# Patient Record
Sex: Male | Born: 1950 | Race: White | Hispanic: No | Marital: Married | Smoking: Never smoker
Health system: Southern US, Community
[De-identification: ages and names within clinical notes are randomized; demographics above are authoritative.]

## PROBLEM LIST (undated history)

## (undated) DIAGNOSIS — F419 Anxiety disorder, unspecified: Secondary | ICD-10-CM

## (undated) DIAGNOSIS — E119 Type 2 diabetes mellitus without complications: Secondary | ICD-10-CM

## (undated) DIAGNOSIS — R310 Gross hematuria: Secondary | ICD-10-CM

## (undated) DIAGNOSIS — R609 Edema, unspecified: Secondary | ICD-10-CM

## (undated) DIAGNOSIS — N281 Cyst of kidney, acquired: Secondary | ICD-10-CM

## (undated) DIAGNOSIS — N289 Disorder of kidney and ureter, unspecified: Secondary | ICD-10-CM

## (undated) DIAGNOSIS — R339 Retention of urine, unspecified: Secondary | ICD-10-CM

## (undated) DIAGNOSIS — Z87438 Personal history of other diseases of male genital organs: Secondary | ICD-10-CM

## (undated) DIAGNOSIS — I10 Essential (primary) hypertension: Secondary | ICD-10-CM

## (undated) DIAGNOSIS — M109 Gout, unspecified: Secondary | ICD-10-CM

## (undated) DIAGNOSIS — Z960 Presence of urogenital implants: Secondary | ICD-10-CM

## (undated) DIAGNOSIS — M069 Rheumatoid arthritis, unspecified: Secondary | ICD-10-CM

## (undated) DIAGNOSIS — N1330 Unspecified hydronephrosis: Secondary | ICD-10-CM

## (undated) DIAGNOSIS — E785 Hyperlipidemia, unspecified: Secondary | ICD-10-CM

## (undated) DIAGNOSIS — R011 Cardiac murmur, unspecified: Secondary | ICD-10-CM

## (undated) DIAGNOSIS — Z973 Presence of spectacles and contact lenses: Secondary | ICD-10-CM

## (undated) HISTORY — PX: SHOULDER ARTHROSCOPY WITH OPEN ROTATOR CUFF REPAIR: SHX6092

## (undated) HISTORY — DX: Essential (primary) hypertension: I10

## (undated) HISTORY — DX: Personal history of other diseases of male genital organs: Z87.438

## (undated) HISTORY — DX: Rheumatoid arthritis, unspecified: M06.9

## (undated) HISTORY — DX: Edema, unspecified: R60.9

## (undated) HISTORY — PX: NASAL FRACTURE SURGERY: SHX718

## (undated) HISTORY — PX: TRANSURETHRAL RESECTION OF PROSTATE: SHX73

## (undated) HISTORY — PX: KNEE ARTHROSCOPY: SUR90

## (undated) HISTORY — PX: APPENDECTOMY: SHX54

---

## 2000-10-10 ENCOUNTER — Ambulatory Visit (HOSPITAL_COMMUNITY): Admission: RE | Admit: 2000-10-10 | Discharge: 2000-10-10 | Payer: Self-pay | Admitting: Surgery

## 2000-10-10 HISTORY — PX: INGUINAL HERNIA REPAIR: SUR1180

## 2005-02-15 ENCOUNTER — Ambulatory Visit: Payer: Self-pay | Admitting: *Deleted

## 2005-02-18 ENCOUNTER — Ambulatory Visit: Payer: Self-pay | Admitting: Cardiology

## 2005-03-31 ENCOUNTER — Ambulatory Visit: Payer: Self-pay | Admitting: Internal Medicine

## 2005-05-10 ENCOUNTER — Ambulatory Visit: Payer: Self-pay | Admitting: Cardiology

## 2005-05-12 ENCOUNTER — Ambulatory Visit: Payer: Self-pay | Admitting: Cardiology

## 2005-07-29 ENCOUNTER — Ambulatory Visit: Payer: Self-pay | Admitting: Cardiology

## 2006-01-03 ENCOUNTER — Ambulatory Visit: Payer: Self-pay | Admitting: Cardiology

## 2006-01-05 ENCOUNTER — Ambulatory Visit: Payer: Self-pay | Admitting: Internal Medicine

## 2006-04-28 ENCOUNTER — Ambulatory Visit: Payer: Self-pay | Admitting: Cardiology

## 2006-05-04 ENCOUNTER — Ambulatory Visit: Payer: Self-pay | Admitting: Cardiology

## 2006-07-10 ENCOUNTER — Ambulatory Visit: Payer: Self-pay | Admitting: Cardiology

## 2006-07-13 ENCOUNTER — Ambulatory Visit: Payer: Self-pay | Admitting: Internal Medicine

## 2006-10-10 ENCOUNTER — Ambulatory Visit: Payer: Self-pay

## 2006-10-12 ENCOUNTER — Ambulatory Visit: Payer: Self-pay | Admitting: Internal Medicine

## 2007-01-15 ENCOUNTER — Ambulatory Visit: Payer: Self-pay

## 2007-01-15 LAB — CONVERTED CEMR LAB
ALT: 74 units/L — ABNORMAL HIGH (ref 0–40)
AST: 48 units/L — ABNORMAL HIGH (ref 0–37)
BUN: 16 mg/dL (ref 6–23)
Calcium: 9.9 mg/dL (ref 8.4–10.5)
Chloride: 101 meq/L (ref 96–112)
Cholesterol: 162 mg/dL (ref 0–200)
Creatinine, Ser: 0.9 mg/dL (ref 0.4–1.5)
Direct LDL: 61.4 mg/dL
GFR calc Af Amer: 113 mL/min
GFR calc non Af Amer: 93 mL/min
HDL: 29.3 mg/dL — ABNORMAL LOW (ref >39.0)
Hgb A1c MFr Bld: 8.3 % — ABNORMAL HIGH (ref 4.6–6.0)
Potassium: 4.1 meq/L (ref 3.5–5.1)
TSH: 1.78 microintl units/mL (ref 0.35–5.50)
VLDL: 77 mg/dL — ABNORMAL HIGH (ref 0–40)

## 2007-01-18 ENCOUNTER — Ambulatory Visit: Payer: Self-pay | Admitting: Cardiology

## 2007-02-26 ENCOUNTER — Ambulatory Visit: Payer: Self-pay | Admitting: Cardiology

## 2007-02-26 LAB — CONVERTED CEMR LAB
ALT: 68 units/L — ABNORMAL HIGH (ref 0–40)
Albumin: 3.7 g/dL (ref 3.5–5.2)
Alkaline Phosphatase: 49 units/L (ref 39–117)
Total Bilirubin: 0.8 mg/dL (ref 0.3–1.2)
Total Protein: 6.8 g/dL (ref 6.0–8.3)

## 2007-03-01 ENCOUNTER — Ambulatory Visit: Payer: Self-pay | Admitting: Cardiology

## 2007-05-31 ENCOUNTER — Ambulatory Visit: Payer: Self-pay | Admitting: Cardiology

## 2007-05-31 LAB — CONVERTED CEMR LAB
ALT: 41 units/L — ABNORMAL HIGH (ref 0–40)
AST: 30 units/L (ref 0–37)
Albumin: 3.6 g/dL (ref 3.5–5.2)
Bilirubin, Direct: 0.1 mg/dL (ref 0.0–0.3)
Cholesterol: 158 mg/dL (ref 0–200)
Direct LDL: 74.9 mg/dL
Total CHOL/HDL Ratio: 7.1
VLDL: 49 mg/dL — ABNORMAL HIGH (ref 0–40)

## 2007-06-07 ENCOUNTER — Ambulatory Visit: Payer: Self-pay | Admitting: Cardiovascular Disease

## 2007-09-17 ENCOUNTER — Ambulatory Visit: Payer: Self-pay | Admitting: Cardiology

## 2007-09-17 LAB — CONVERTED CEMR LAB
Alkaline Phosphatase: 28 units/L — ABNORMAL LOW (ref 39–117)
Direct LDL: 56.2 mg/dL
HDL: 23.5 mg/dL — ABNORMAL LOW (ref >39.0)
Total Bilirubin: 0.8 mg/dL (ref 0.3–1.2)
Total CHOL/HDL Ratio: 5.7
Total Protein: 6.6 g/dL (ref 6.0–8.3)
Triglycerides: 332 mg/dL (ref 0–149)

## 2007-09-20 ENCOUNTER — Ambulatory Visit: Payer: Self-pay | Admitting: Cardiology

## 2008-03-06 ENCOUNTER — Ambulatory Visit: Payer: Self-pay | Admitting: Cardiology

## 2008-03-06 LAB — CONVERTED CEMR LAB
ALT: 39 units/L (ref 0–53)
AST: 28 units/L (ref 0–37)
Bilirubin, Direct: 0.1 mg/dL (ref 0.0–0.3)
Cholesterol: 132 mg/dL (ref 0–200)
Direct LDL: 64.5 mg/dL
HDL: 25 mg/dL — ABNORMAL LOW (ref >39.0)
Total Bilirubin: 0.9 mg/dL (ref 0.3–1.2)
Total Protein: 6.3 g/dL (ref 6.0–8.3)

## 2008-03-13 ENCOUNTER — Ambulatory Visit: Payer: Self-pay | Admitting: Cardiology

## 2008-09-04 ENCOUNTER — Ambulatory Visit: Payer: Self-pay | Admitting: Cardiology

## 2008-09-04 LAB — CONVERTED CEMR LAB
ALT: 43 units/L (ref 0–53)
AST: 32 units/L (ref 0–37)
Alkaline Phosphatase: 39 units/L (ref 39–117)
Bilirubin, Direct: 0.1 mg/dL (ref 0.0–0.3)
Total Bilirubin: 0.8 mg/dL (ref 0.3–1.2)
Total Protein: 6.7 g/dL (ref 6.0–8.3)

## 2008-09-11 ENCOUNTER — Ambulatory Visit: Payer: Self-pay | Admitting: Cardiology

## 2011-05-10 NOTE — Assessment & Plan Note (Signed)
[facility]                               LIPID CLINIC NOTE   Jerry York, Jerry York                     MRN:          [id number]  DATE:03/13/2008                            DOB:          August 01, 1951    Jerry York returns to lipid clinic for a 89-month follow-up  appointments.  He states that he is feeling well and that he has been  doing well with his current lipid therapy which includes Lipitor,  fenofibrate, omega III fatty acids and Niaspan.  He states that he is  compliant with his medications.  He is tolerating them well and has not  had any issues with any of them. Review of diet on 24-hour recall, he  states for breakfast yesterday he had grilled chicken on a bun with diet  Coke. For lunch he had a grilled chicken also with a diet Coke. For  dinner she had lima beans, asparagus, and chicken. Upon further  questioning, he states that his diet does have a lot of chicken in it.  He states that he also eats [place] and sometimes will eat salmon or  another type of fish once a week. He states that he does not fry his  foods, he bakes or grills his foods. He also has diabetes and is trying  to watch his portion sizes especially with carbohydrates and is trying  to follow a heart healthy diet.   For exercise Jerry York is walking 30 minutes a least 3 days a week.  He states sometimes he does not make it to 3 days but most weeks he does  make it to 3 days a week.  He is not doing any other sort of exercise.   In talking today, he did mention to me that he does have a family  history of cholesterol and he states that his father had a heart attack  when he was in his 36s.   MEDICATIONS:  Medications were reviewed today and include:  1. Lipitor 40 mg daily.  2. Fenofibrate 200 mg daily.  3. Omega 3 fish oil 1200 mg three times daily.  4. Niaspan 1 gram twice daily.  5. Co-Q 10 150 mg daily.  6. Lisinopril 40 mg twice daily.  7. Potassium chloride 20  mEq daily.  8. Enteric-coated aspirin 325 mg daily.  9. Multivitamin daily.  10.Folic acid.  11.B6/B12 daily.  12.Metformin. He is taking 500 mg in the morning, 500 mg at lunch and      1000 mg at dinner.  13.Metoprolol 100 mg twice daily.  14.Hydrochlorothiazide 25 mg daily.   LABORATORY DATA:  Lab results from March 06, 2008 show a total  cholesterol of 132, triglycerides of 284, HDL of 25.0, LDL of 64.5. LFTs  were all within normal limits.  The patient states that next week he is  scheduled for a visit with his primary care physician at which point  they will check his A1c.   PHYSICAL EXAM:  Weight is 186.21 pounds, this is up slightly from  September at 184 pounds.  Blood pressure was 140/72.  Jerry York is  somewhat concerned about his blood pressure as he states that this is  higher than it normally is. His blood pressure is not at goal of less  than 130/90 because he is a diabetic.  He states that when he was on  Bisoprolol before, he thinks that he had better blood pressure control  than what he has with metoprolol. He was unsure of why the Bisoprolol  was switched to metoprolol. I advised him to talk with his primary care  Sheriann Newmann next week when he goes in for an appointment to see if they can  get that switched.   ASSESSMENT:  Total cholesterol is at goal of less than 200,  triglycerides are above goal of 150, HDL is below goal of greater than  40, LDL is at goal of less than 70 and LFTs were all within normal  limits.  Of note, despite elevated triglycerides, his triglycerides have  improved since September when they were 332 and HDL has improved  slightly from 23-1/2 in September 2008 to 25.0 now. He is at maximum  dose of fenofibrate. He is at maximum dose of Niaspan and also a maximum  dose of omega 3 fatty acids.  I explained to him the role that diet and  diabetes play with triglycerides and advised him that when he gets his  A1c checked next week with his  primary care Lezlie Ritchey that he check to  see what the A1c is and they may want to add another antidiabetic agent  to help control blood sugars which may in turn help to improve  triglycerides. However, he states that his fasting blood sugars are  usually running under 120 and his evening sugars are running around 86,  so his A1c may be at goal.  We will have to see what the lab results  say. He is doing well with his diet.  He is definitely making good  choices with the types of meats that he eats and the way that he  prepares them. I do believe he is following a heart healthy diet. We  discussed the method  diabetes in which case 1/2 of your plate should  consist of vegetables, 1/4 of plate consisting of carbohydrates and then  1/4 of your plate consisting of meat or protein. We did discuss  healthier carbohydrate options such as whole wheat bread, whole grain  pasta, brown rice. We did discuss also the role of starchy and non-  starchy vegetables and I encouraged him to increase the amount of  vegetables that he has in his diet. His exercise is I believe adequate.  I think with only exercising 3 days a week, I think he could increase  this to more frequently during the week, perhaps 4-5 days. We did  discuss if he was not able to go up to 4-5 days that perhaps instead of  walking for 30 minutes he could walk for 45 or 50 minutes.   PLAN:   MEDICATIONS:  Continue Lipitor 40 mg, fenofibrate 200 mg daily, Niaspan  1 gram twice daily and the omega 3  fish oil 1200 mg three times daily.  I did discuss with him to talk with his primary care Shivaan Tierno next week  with further blood pressure medication titration.   DIET:  Continue a heart healthy diet.  I also discussed with him  different ways to spice his foods, the grill mate spices or different  seasonings packets that he could get to  help with the chicken. He did  state in talking with him sometimes it is somewhat bland to only eat   chicken, so we discussed ways that he could make those more interesting  to eat.   EXERCISE:  I encouraged Jerry York to increase his exercise to 4-5 days  a week and to at least walk for 30 minutes and perhaps more and to  incorporate other forms of exercise as the weather gets warmer and the  weather is nice.   FOLLOWUP:  We will see Jerry York back in 6 months in lipid clinic at  which point we will check a lipid and a liver panel to assess therapy  and to reevaluate our goals.    Gerre Pebbles, PharmD dictating for [name], PharmD      [name], PharmD, BCPS, CPP  Electronically Signed      Jonelle Sidle, MD  Electronically Signed   MP/MedQ  DD: 03/13/2008  DT: 03/13/2008  Job #: [id number]

## 2011-05-10 NOTE — Assessment & Plan Note (Signed)
[facility]                               LIPID CLINIC NOTE   NAME:FORRESTCleburn, Maiolo                     MRN:          [id number]  DATE:06/07/2007                            DOB:          01/20/1951    The patient seen in the lipid clinic for further evaluation and  medication titration associated with his hyperlipidemia in the setting  of documented coronary artery disease. He has been compliant with his  triple therapy. He has had no dietary changes. He does state that maybe  he is eating fewer carbohydrates and lower sugar content items. He is  walking at least 30 minutes 3 day each week.   PAST MEDICAL HISTORY:  Pertinent for diabetes and hyperlipidemia.   CURRENT MEDICATIONS:  1. Lipitor 40 mg daily.  2. Fenofibrate 200 mg daily.  3. Triomega fish oil 1200 mg 3 times daily.  4. Niaspan 1 gram twice daily.  5. Co-Q 10, 100 mg twice daily.  6. Bisoprolol HCT 10/6.25 mg one daily.  7. Lisinopril 40 mg twice daily.  8. Potassium chloride 20 mEq daily.  9. Enteric coated aspirin 325 mg daily.  10.Multivitamin daily.  11.Vitamin B supplement daily.  12.Metformin 1000 mg twice daily.   DRUG ALLERGIES:  SULFA.   REVIEW OF SYSTEMS:  As stated in the HPI and otherwise negative.   PHYSICAL EXAMINATION:  VITAL SIGNS:  Weight today is 183 pounds, blood  pressure is 110/60, heart rate is 68, respirations 17.   LABORATORY DATA:  Labs on May 31, 2007 revealed an LDL of 74.9 and LFTs  within normal limits.   ASSESSMENT:  The patient is a pleasant male with diabetes who is feeing  and doing better. He has seen improvement in his overall blood sugars  and expect ultimately his triglycerides to track downward. The patient's  LDL is at goal, his LTFs are at goal and his triglycerides are probably  trending downward.   PLAN:  The patient will continue his normal medications without dietary  changes at this time. He will continue on a heart healthy diet  and will  followup in 3 or 4 months. He will try to exercise 45-60 minutes each  day and call with questions or problems in the meantime.      [name], PharmD, BCPS, CPP  Electronically Signed      Rollene Rotunda, MD, [facility]  Electronically Signed   MP/MedQ  DD: 06/07/2007  DT: 06/08/2007  Job #: [id number]

## 2011-05-10 NOTE — Assessment & Plan Note (Signed)
[facility]                               LIPID CLINIC NOTE   Jerry York, Jerry York                     MRN:          [id number]  DATE:09/20/2007                            DOB:          1951-01-19    Jerry York comes in today for followup of his hyperlipidemia  management.  He has been compliant and has been tolerating his current  therapy, which includes:  1. Lipitor 40 mg daily.  2. Fenofibrate 200 mg daily.  3. Fish oil 1200 mg 3 times a day.  4. Niaspan 1 g twice a day.  5. Coenzyme Q-10 100 mg twice a day.  6. His other medications have not changed and they include lisinopril.  7. Potassium.  8. Aspirin.  9. Multivitamin.  10.Folic acid with B vitamins.  11.Metformin.  12.Metoprolol.  13.Hydrochlorothiazide.   PHYSICAL EXAM:  Includes a weight of 184 pounds, blood pressure 128/74,  heart rate is 54.  Laboratory data includes a total cholesterol of 133,  triglycerides 332, HDL 23.5, LDL 56.2.  Liver function tests are within  normal limits.   ASSESSMENT:  Jerry York LDL is at goal of less than 70.  HDL remains  below goal, and triglycerides are elevated.  He has been continuing to  walk 30 minutes at a time 3 days a week around his neighborhood.  He has  continued to try to follow a heart-healthy diet.  Upon review of his  medicines, we discovered he was taking bisoprolol with  hydrochlorothiazide along with metoprolol and hydrochlorothiazide.  We  discussed duplication of therapy.   PLAN:  Jerry York is going to continue with his current therapy for  cholesterol management.  We reviewed his medicines with [name],  PharmD, BCPS, CPP and decided to stop the bisoprolol and  hydrochlorothiazide and increase his metoprolol dose, and continue  hydrochlorothiazide.  I asked him to continue with a heart-healthy diet.  I asked him to increase the amount of exercise he gets and the intensity  of that exercise.  I encouraged him to  join a gym especially with colder  weather coming so that he could walk briskly for at least 30 minutes at  a time at least 3 days a week.  I encouraged him to start a weight  training program as well and told him that this could probably help get  his HDL to go up.  We are going to follow up with him in 6 months, at  which time we will check a lipid and liver panel.  I asked him to  continue monitoring his blood glucose 1 to 2 times a day, and since we  are making an adjustment to his hypertension medicines, to continue  taking his blood pressure at home on a regular basis, and  recording that.  He was encouraged to call us with any questions or  concerns between now and his next appointment.      Charolotte Eke, PharmD  Electronically Signed      Rollene Rotunda, MD, [facility]  Electronically Signed   TP/MedQ  DD: 09/20/2007  DT: 09/20/2007  Job #: [id number]

## 2011-05-10 NOTE — Assessment & Plan Note (Signed)
Jerry York                               LIPID CLINIC NOTE   Jerry York, Jerry York                     MRN:          [id number]  DATE:09/11/2008                            DOB:          1951/04/10    Return office visit for Lipid Clinic.   PAST MEDICAL HISTORY:  1. Hyperlipidemia.  2. Diabetes mellitus.  3. Hypertension.   MEDICATIONS:  1. Fenofibrate 200 mg daily.  2. Fish oil 3 g daily.  3. Niaspan 1 g twice daily.  4. Coenzyme Q 10 daily.  5. Lisinopril 40 mg twice daily.  6. KCl 20 mEq daily.  7. Aspirin 325 mg daily.  8. Multivitamin daily.  9. Folic acid and vitamin B daily.  10.Metformin 1000 mg twice daily.  11.Metoprolol 100 mg twice daily.  12.Hydrochlorothiazide 25 mg daily.  13.Simvastatin 80 mg daily.  14.Amlodipine 10 mg daily.  15.Glipizide 2.5 mg p.r.n. CBG greater than 200.   VITAL SIGNS:  Weight 186 pounds, blood pressure 105/70, and heart rate  78.   LABORATORY DATA:  Total cholesterol 103, triglyceride 234, HDL 23, and  LDL 51.  LFTs within normal limits.   ASSESSMENT:  Jerry York is a pleasant gentleman who returns to Lipid  Clinic today with no chest pain, no shortness of breath, no muscle aches  or pains.  He states compliance with current medication regimen.  He  says he exercises 30 minutes everyday and that 30-minute is by walking.  He says that he walks approximately half a mile in those 30 minutes,  that is a very low-impact cardio exercise and he is walking at a very  slow pace.  I have encouraged him to increase that half a mile to  approximately 20 minutes, to do some interval training, to walk as fast  as he can for a minute and slow down for a minute, to hopefully shave  off approximately 5 minutes from his walking time prior to next visit.  I feel like a little more high-impact cardio exercise would be very  beneficial for Jerry York given that his total cholesterol is at goal  of less than 200,  triglycerides are significantly greater than goal of  less than 150, HDL is less than goal of greater than 40, and LDL is at  goal of less than 70.  He does stay compliant with a low-fat and low-  carbohydrate diet.  He has one or two diet sodas a day, drinking water  the rest of the day in between.  He has one sweet tea with his evening  meal with Splenda.  He says that he eats no fried foods, low-fat, high-  protein, lean chicken, or pork and rarely any red meat.  He says that he  eats a lot of vegetables and small amounts of carbohydrates with his  meals and he does not snack.  He states that his last A1c was 6.3 and  that his morning CBGs are usually about a 100.  He rarely checks the  postprandial or afternoon CBGs.  We have encouraged him to check  a CBG  after his meal or later in the afternoon versus only checking his  morning CBGs to see if there is a trend of elevation in the afternoon.  We have also asked him to increase his fish oil from 1 tablet three  times a day to 2 tablets three times a day to see if we can help bring  down these triglycerides.   PLAN:  1. Continue current medications.  2. Increase fish oil 3 g to 6 g daily.  3. Increase impact of exercise, I mean, speed of walking.  4. Continue a low-fat and low-carbohydrate diet.  5. Follow up in 4 months for lipid panel and LFTs and we will make      changes at this time.      Jerry York, PharmD  Electronically Signed      Jerry Sans. Daleen Squibb, MD, [facility]  Electronically Signed   LC/MedQ  DD: 09/11/2008  DT: 09/11/2008  Job #: [id number]

## 2011-05-13 NOTE — Assessment & Plan Note (Signed)
[facility]                              CARDIOLOGY OFFICE NOTE   Jerry York, Jerry York                       MRN:          [id number]  DATE:10/12/2006                            DOB:          03/24/1951    Return office visit for lipid clinic.   PAST MEDICAL HISTORY:  Hyperlipidemia and hypertension.   MEDICATIONS:  1. Multivitamin daily.  2. Folic acid.  3. Vitamin B12 daily.  4. Lisinopril 40 mg twice daily.  5. Bisoprolol and hydrochlorothiazide 10/6.25 daily.  6. Metoprolol 50 mg twice daily.  7. Hydrochlorothiazide 25 mg daily.  8. Niacin 500 mg daily.  9. Aspirin 325 mg daily.  10.Fish oil 500 mg 3 times daily.  11.Potassium 20 mEq daily.  12.Lipitor 40 mg daily.  13.Fenofibrate 200 mg daily.  14.Coenzyme Q10 50 mg daily.   VITAL SIGNS:  Weight 193 pounds, blood pressure 130/80, heart rate 64.   LAB DATA:  Total cholesterol 166, triglycerides 514, LDL 123, HDL 21.6.  AST  44, ALT 72.   ASSESSMENT:  Jerry York returns to the lipid clinic today with no chest  pain, no shortness of breath, no muscle aches or pains.  He is compliant  with current medication regimen.  His total cholesterol is at goal at less  than 200, triglycerides above goal of less than 150, although improved from  last visit at 807.  HDL less than goal of greater than 40, although better  from last visit of 18.5.  LDL greater than goal of less than 100 and a  significant increase from last visit, which was 71.  The patient has been  exercising 3 times a week by walking 30 minutes at a time.  He follows a  fairly heart-healthy diet with grilled chicken or salmon for his evening  meals.  He does say that, in the summer time, they deep fry squash,  zucchini, and okra, but he says that he only eats a few pieces of these.  For morning breakfast he either eats Cheerios or 3 times a week, Egg [employer], but he says he leaves off the bacon and the ham, and eats the  sausage once a week.  He had previously been initiated on TriCor and fish  oil when he started in the clinic in 2001 where his triglycerides at that  time were 6-900.  However, in the recent years, his triglycerides have been  well-controlled in the 170s and below.  Just recently, over the last 6  months, he has had a tremendous increase in his triglycerides going from the  800s now down to 500s.  He says he has had no change in diet, no changes in  medication, and is unaware of why we are having such a huge change in his  triglycerides.  On next visit I will check a CBG and also check a hemoglobin  A1C.  I will also check a TSH to ensure that there are no other problems  causing hypertriglyceridemia.  He does state compliance, as stated before,  with exercise regimen and medication compliance.  PLAN:  1. Continue current medication regimen.  2. Increase Niaspan to 1000 mg and, if the patient tolerates, to 1500 mg      by next visit.  3. Continue heart healthy diet.  4. Increase exercise to 4 times a week.  5. Followup visit in 3 months for a lipid panel and LFTs and make      adjustments needed at that time.     ______________________________  Leota Sauers, PharmD    ______________________________  Jesse Sans. Daleen Squibb, MD, [facility]   LC/MedQ  DD: 10/12/2006  DT: 10/15/2006  Job #: [id number]

## 2011-05-13 NOTE — Op Note (Signed)
[facility]  Patient:    Jerry York, Jerry York                     MRN: [id number] Proc. Date: 10/10/00 Adm. Date:  [id number] Attending:  Abigail Miyamoto A                           Operative Report  PREOPERATIVE DIAGNOSIS:  Right inguinal hernia.  POSTOPERATIVE DIAGNOSIS:  Right inguinal hernia.  PROCEDURE:  Right inguinal hernia repair with mesh.  SURGEON:  Abigail Miyamoto, M.D.  ANESTHESIA:  General endotracheal anesthesia.  ESTIMATED BLOOD LOSS:  Minimal.  DESCRIPTION OF PROCEDURE:  The patient was brought to the operating room and identified as Jerry York.  He was placed supine on the operating table and anesthesia was induced.  His right groin was then prepped and draped in the usual sterile fashion.  Using a #10 blade, a small longitudinal incision was made in the patients right groin.  The incision was carried down through Scarpas fascia with electrocautery.  The external oblique fascia was then identified with a scalpel and further toward the external ring with the scalpel.  The testicular and cord structures were identified and controlled circumferentially with a Penrose drain.  The patient was found to have a large indirect inguinal hernia with a large amount of omentum in the chronic hernia sac.  The sac was separated from its surrounding cord structures and opened, and the omental contents were passed back into the peritoneal cavity.  Next, the preperitoneal space was dissected out.  A piece of Ethicon Prolene hernia mesh system was brought onto the field.  A large piece of mesh was used.  The mesh was then passed into the preperitoneal space and the lower aspect was opened up.  The upper aspect of the mesh was then opened into the inguinal floor.  A small slit was made in the mesh.  The mesh was then sewn in place circumferentially with interrupted 2-0 Vicryl sutures, sewing it first to the pubic tubercle, laterally to the shelving  edge of the inguinal ligament, medially to the transversalis fascia, then out laterally, and then sewing around the cord.  Good coverage of the inguinal floor appeared to be achieved. At this point, the wound was irrigated.  The external oblique fascia was then closed with running 2-0 Vicryl sutures.  Scarpas fascia was then closed with interrupted 3-0 Vicryl sutures, and the skin was closed with a running 4-0 Vicryl suture.  Steri-Strips, gauze, and tape were then applied.  Prior to closing the wound, it was anesthetized with 0.25% Marcaine.  Steri-Strips, gauze, and Tegaderm were then applied.  The patient tolerated the procedure well.  All sponge, needle, and instrument counts were correct at the end of the procedure.  The patient was then extubated in the operating room and taken in stable condition to the recovery room. DD:  10/10/00 TD:  10/11/00 Job: [id number]

## 2011-05-13 NOTE — Assessment & Plan Note (Signed)
[facility]                               LIPID CLINIC NOTE   ARLIS, YALE                     MRN:          [id number]  DATE:01/18/2007                            DOB:          10-03-1951    Mr. Griner comes in today for followup of his hypercholesterolemia  therapy.   MEDICATIONS:  Include:  1. Niacin 2 gm daily.  2. Omega fish oil 1.5 gm daily.  3. Lipitor 40 mg daily.  4. Fenofibrate 200 mg daily.   He also takes coenzyme q. 10 and milk thistle supplements.  He has been compliant with these medications and tolerating them just  fine.  Other medications have not changed from his last visit with [name].   PHYSICAL EXAMINATION:  Weight is 190 pounds, down 3 pounds.  Blood  pressure is 120/80, heart rate 64.   LABORATORY DATA:  Included total cholesterol 162, triglycerides 384, HDL  29.3, LDL 61.4, AST is 48, ALT is 74.  Fasting glucose was 203.  Hemoglobin A1C was 8.3.  TSH was 1.78.   ASSESSMENT:  Mr. Kirn has been feeling good.  He has not been  exercising as instructed.  He states it is tough to get in the cold  weather and also it gets dark by the time he gets home from work.  In  his diet he has not been following any specific diet plan.  His  triglycerides are improved, down from 514.  HDL has continued to rise,  it is up from 21.6  last time, the goal is greater than 40.  LDL is at  goal less than 191.  His liver enzymes remain slightly elevated but are  stable.  His TSH is within normal limits; however, his hemoglobin A1C  was 8.3 and his fasting glucose was quite high.  If he does in fact have  type 2 diabetes, this could be causing the elevation of the  triglycerides.  He is followed by Dr. Perrin Maltese at [facility] [address]   PLAN:  We are going to continue with his medications and samples were  given.  I gave him some handouts on type 2 diabetes, including diet  instructions.  I urged him to start  exercising, possibly with some  exercise equipment in the house, like a stationary bike or a treadmill.  In addition, I have decided to start metformin 500 mg daily and a  prescription was called in.  We talked to him about what to expect  concerning this medication and I urged him to get a blood glucose  machine and start checking it at least twice daily, in the morning and  before dinner and keeping a log of this information and bringing it with  him to his next appointment with [name], as well  as to any appointments with Dr. Perrin Maltese.  We are going to follow up with  him here in the Lipid Clinic in 6 weeks and we will recheck his lipid  and liver panels.      Charolotte Eke, PharmD  Electronically Signed  Luis Abed, MD, [facility]  Electronically Signed   TP/MedQ  DD: 01/18/2007  DT: 01/18/2007  Job #: [id number]   cc:   Jonita Albee, M.D.

## 2011-05-13 NOTE — Assessment & Plan Note (Signed)
[facility]                               LIPID CLINIC NOTE   RICHERD, GRIME                     MRN:          [id number]  DATE:03/01/2007                            DOB:          08/14/1951    Mr. Stineman comes in today with no acute problems or complaints.  He has  been compliant with his cholesterol medications which include Niaspan 1  g twice daily, Lipitor 40 mg daily, fenofibrate 200 mg daily, fish oil  1.5 g daily, and Coenzyme Q10 100 mg twice daily.   OTHER MEDICATIONS:  1. Multivitamin.  2. Foltx.  3. Lisinopril.  4. Bisoprolol/HCTZ.  5. Enteric coated aspirin 325.  6. Potassium.  7. Metformin 500 mg daily.   PHYSICAL EXAM:  Weight was 186 pounds, blood pressure was 105/75, heart  rate of 62.   LABORATORY DATA:  Total cholesterol 158, triglycerides 399, HDL 28.6,  LDL 54.8, ALT 45, AST 68.   Mr. Wrinkle has been checking his blood sugars at home every morning and  afternoon.  He reports they have been running around 200 every morning  and around 125 in the afternoon.   ASSESSMENT:  Mr. Alberico triglycerides remain quite elevated.  This is  probably due to his elevated blood glucose.  He was started on metformin  at his last visit here with [name] and has been tolerating it just fine but  his blood sugars are not quite controlled yet.  His HDL is about the  same as last time but remains lower than the goal of greater than 40.  His LDL is at goal of less than 100, although with the diagnosis of type  2 diabetes his goal LDL would be reduced to less than 70.  He admits to  some dietary indiscretions.  At his last visit we gave him some reading  information on low carbohydrate diet as well as a heart healthy diet  which he has reviewed but has been having trouble incorporating it.  He  walks 30 minutes at a time 2-3 days a week.   PLAN:  No changes to cholesterol medications at this time.  I think his  triglycerides will come  under better control when his blood sugars are  controlled.  He has plans to see his primary care physician, Dr. Perrin Maltese,  soon.  I encouraged him to try to make improvements to his diet as  outlined.  Also, I congratulated him on his regular exercise but asked  him to increase the frequency to 4-5 times a week.  We scheduled a  followup appointment with [name] here in 3 months.      Charolotte Eke, PharmD  Electronically Signed      Rollene Rotunda, MD, [facility]  Electronically Signed   TP/MedQ  DD: 03/01/2007  DT: 03/01/2007  Job #: [id number]   cc:   Jonita Albee, M.D.

## 2013-05-22 ENCOUNTER — Ambulatory Visit: Payer: BC Managed Care – PPO

## 2013-05-22 ENCOUNTER — Ambulatory Visit (INDEPENDENT_AMBULATORY_CARE_PROVIDER_SITE_OTHER): Payer: BC Managed Care – PPO | Admitting: Family Medicine

## 2013-05-22 VITALS — BP 124/82 | HR 79 | Temp 97.9°F | Resp 16 | Ht 66.5 in | Wt 190.2 lb

## 2013-05-22 DIAGNOSIS — M25512 Pain in left shoulder: Secondary | ICD-10-CM

## 2013-05-22 DIAGNOSIS — M67922 Unspecified disorder of synovium and tendon, left upper arm: Secondary | ICD-10-CM

## 2013-05-22 DIAGNOSIS — M25519 Pain in unspecified shoulder: Secondary | ICD-10-CM

## 2013-05-22 DIAGNOSIS — M6790 Unspecified disorder of synovium and tendon, unspecified site: Secondary | ICD-10-CM

## 2013-05-22 MED ORDER — DICLOFENAC SODIUM 75 MG PO TBEC: 75.0000 mg | DELAYED_RELEASE_TABLET | Freq: Two times a day (BID) | ORAL | Status: DC

## 2013-05-22 NOTE — Progress Notes (Addendum)
Subjective: 62 year old man who is been having problems with pain in his left shoulder for the last 6 months. He knows of no specific injury. 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 Several months ago he spoke to his diabetes Dr. about this, and an x-ray was done which was unremarkable and he was placed on an anti-inflammatory medicine, possibly Celebrex. It transiently helped, but he is persisted in having the pain. Hurts especially if he lays on it at night in the wrong position.  Objective: No major acute distress. Full range of motion of his left arm but when he reaches behind his back he has a lot of pain in that shoulder anteriorly. He points to the anterior aspect of the shoulder in the area of the biceps tendon groove as the area of most pain. He is indeed tender in that area. The rest of the shoulder girdle since the intact. Grip is good. Neurovascular normal.  The patient is diabetic, usually runs an A1c in the 7.1 range and sugars from 1-150. Has no history of kidney disease.  Assessment: Left biceps tendinopathy and shoulder pain.  Plan: Culture and 75 mg twice daily. If he is not doing better over the next couple of weeks we will refer to an orthopedist. He is to let us know.   [facility] reading (PRIMARY) by  Dr. Alwyn Ren Normal shoulder .

## 2013-05-22 NOTE — Patient Instructions (Signed)
Take the diclofenac (Voltaren) one with breakfast and one with supper. Drink plenty of fluids. If the medication upset your stomach stopped taking it.  If not improving significantly over the next couple of weeks we will refer you to an orthopedic doctor.

## 2016-12-12 ENCOUNTER — Ambulatory Visit (INDEPENDENT_AMBULATORY_CARE_PROVIDER_SITE_OTHER): Payer: Self-pay | Admitting: Family Medicine

## 2016-12-12 VITALS — BP 122/72 | HR 72 | Temp 98.2°F | Resp 17 | Ht 66.5 in | Wt 187.0 lb

## 2016-12-12 DIAGNOSIS — E119 Type 2 diabetes mellitus without complications: Secondary | ICD-10-CM

## 2016-12-12 DIAGNOSIS — R350 Frequency of micturition: Secondary | ICD-10-CM

## 2016-12-12 DIAGNOSIS — R300 Dysuria: Secondary | ICD-10-CM

## 2016-12-12 LAB — POC MICROSCOPIC URINALYSIS (UMFC): MUCUS RE: ABSENT

## 2016-12-12 LAB — POCT URINALYSIS DIP (MANUAL ENTRY)
BILIRUBIN UA: NEGATIVE
Bilirubin, UA: NEGATIVE
Blood, UA: NEGATIVE
Leukocytes, UA: NEGATIVE
Nitrite, UA: NEGATIVE
PH UA: 5.5
Spec Grav, UA: 1.015
UROBILINOGEN UA: 0.2

## 2016-12-12 MED ORDER — TAMSULOSIN HCL 0.4 MG PO CAPS: 0.4000 mg | ORAL_CAPSULE | Freq: Every day | ORAL | 3 refills | Status: DC

## 2016-12-12 MED ORDER — CIPROFLOXACIN HCL 500 MG PO TABS: 500.0000 mg | ORAL_TABLET | Freq: Two times a day (BID) | ORAL | 0 refills | Status: DC

## 2016-12-12 MED ORDER — PHENAZOPYRIDINE HCL 200 MG PO TABS: 200.0000 mg | ORAL_TABLET | Freq: Three times a day (TID) | ORAL | 0 refills | Status: DC | PRN

## 2016-12-12 NOTE — Patient Instructions (Addendum)
IF you received an x-ray today, you will receive an invoice from [facility]. Please contact [facility] at [phone] with questions or concerns regarding your invoice.   IF you received labwork today, you will receive an invoice from [facility]. Please contact [facility] at [phone] with questions or concerns regarding your invoice.   Our billing staff will not be able to assist you with questions regarding bills from these companies.  You will be contacted with the lab results as soon as they are available. The fastest way to get your results is to activate your My Chart account. Instructions are located on the last page of this paperwork. If you have not heard from [name] regarding the results in 2 weeks, please contact this office.     Urinary Frequency The number of times a normal person urinates depends upon how much liquid they take in and how much liquid they are losing. If the temperature is hot and there is high humidity, then the person will sweat more and usually breathe a little more frequently. These factors decrease the amount of frequency of urination that would be considered normal. The amount you drink is easily determined, but the amount of fluid lost is sometimes more difficult to calculate.  Fluid is lost in two ways:  Sensible fluid loss is usually measured by the amount of urine that you get rid of. Losses of fluid can also occur with diarrhea.  Insensible fluid loss is more difficult to measure. It is caused by evaporation. Insensible loss of fluid occurs through breathing and sweating. It usually ranges from a little less than a quart to a little more than a quart of fluid a day. In normal temperatures and activity levels, the average person may urinate 4 to 7 times in a 24-hour period. Needing to urinate more often than that could indicate a problem. If one urinates 4 to 7 times in 24 hours and has large volumes each time, that could indicate a  different problem from one who urinates 4 to 7 times a day and has small volumes. The time of urinating is also important. Most urinating should be done during the waking hours. Getting up at night to urinate frequently can indicate some problems. CAUSES  The bladder is the organ in your lower abdomen that holds urine. Like a balloon, it swells some as it fills up. Your nerves sense this and tell you it is time to head for the bathroom. There are a number of reasons that you might feel the need to urinate more often than usual. They include:  Urinary tract infection. This is usually associated with other signs such as burning when you urinate.  In men, problems with the prostate (a walnut-size gland that is located near the tube that carries urine out of your body). There are two reasons why the prostate can cause an increased frequency of urination:  An enlarged prostate that does not let the bladder empty well. If the bladder only half empties when you urinate, then it only has half the capacity to fill before you have to urinate again.  The nerves in the bladder become more hypersensitive with an increased size of the prostate even if the bladder empties completely.  Pregnancy.  Obesity. Excess weight is more likely to cause a problem for women than for men.  Bladder stones or other bladder problems.  Caffeine.  Alcohol.  Medications. For example, drugs that help the body get rid of extra fluid (  diuretics) increase urine production. Some other medicines must be taken with lots of fluids.  Muscle or nerve weakness. This might be the result of a spinal cord injury, a stroke, multiple sclerosis, or Parkinson disease.  Long-standing diabetes can decrease the sensation of the bladder. This loss of sensation makes it harder to sense the bladder needs to be emptied. Over a period of years, the bladder is stretched out by constant overfilling. This weakens the bladder muscles so that the bladder  does not empty well and has less capacity to fill with new urine.  Interstitial cystitis (also called painful bladder syndrome). This condition develops because the tissues that line the inside of the bladder are inflamed (inflammation is the body's way of reacting to injury or infection). It causes pain and frequent urination. It occurs in women more often than in men. DIAGNOSIS   To decide what might be causing your urinary frequency, your health care provider will probably:  Ask about symptoms you have noticed.  Ask about your overall health. This will include questions about any medications you are taking.  Do a physical examination.  Order some tests. These might include:  A blood test to check for diabetes or other health issues that could be contributing to the problem.  Urine testing. This could measure the flow of urine and the pressure on the bladder.  A test of your neurological system (the brain, spinal cord, and nerves). This is the system that senses the need to urinate.  A bladder test to check whether it is emptying completely when you urinate.  Cystoscopy. This test uses a thin tube with a tiny camera on it. It offers a look inside your urethra and bladder to see if there are problems.  Imaging tests. You might be given a contrast dye and then asked to urinate. X-rays are taken to see how your bladder is working. TREATMENT  It is important for you to be evaluated to determine if the amount or frequency that you have is unusual or abnormal. If it is found to be abnormal, the cause should be determined and this can usually be found out easily. Depending upon the cause, treatment could include medication, stimulation of the nerves, or surgery. There are not too many things that you can do as an individual to change your urinary frequency. It is important that you balance the amount of fluid intake needed to compensate for your activity and the temperature. Medical problems  will be diagnosed and taken care of by your physician. There is no particular bladder training such as Kegel exercises that you can do to help urinary frequency. This is an exercise that is usually recommended for people who have leaking of urine when they laugh, cough, or sneeze. HOME CARE INSTRUCTIONS   Take any medications your health care provider prescribed or suggested. Follow the directions carefully.  Practice any lifestyle changes that are recommended. These might include:  Drinking less fluid or drinking at different times of the day. If you need to urinate often during the night, for example, you may need to stop drinking fluids early in the evening.  Cutting down on caffeine or alcohol. They both can make you need to urinate more often than normal. Caffeine is found in coffee, tea, and sodas.  Losing weight, if that is recommended.  Keep a journal or a log. You might be asked to record how much you drink and when and where you feel the need to urinate. This will  also help evaluate how well the treatment provided by your physician is working. SEEK MEDICAL CARE IF:   Your need to urinate often gets worse.  You feel increased pain or irritation when you urinate.  You notice blood in your urine.  You have questions about any medications that your health care provider recommended.  You notice blood, pus, or swelling at the site of any test or treatment procedure.  You develop a fever of more than 100.911F (38.1C). SEEK IMMEDIATE MEDICAL CARE IF:  You develop a fever of more than 102.11F (38.9C). This information is not intended to replace advice given to you by your health care provider. Make sure you discuss any questions you have with your health care provider. Document Released: 10/08/2009 Document Revised: 01/02/2015 Document Reviewed: 07/08/2015 Elsevier Interactive Patient Education  2017 [employer].

## 2016-12-12 NOTE — Progress Notes (Signed)
Subjective:  By signing my name below, I, Raven Small, attest that this documentation has been prepared under the direction and in the presence of Norberto Sorenson, MD.  Electronically Signed: Andrew Au, ED Scribe. 12/12/2016. 11:37 AM.   Patient ID: Jerry York, male    DOB: Jun 20, 1951, 65 y.o.   MRN: [id number]  HPI   Chief Complaint  Patient presents with  . urine frequency   HPI Comments: TARRENCE ENCK is a 65 y.o. male who presents to the [facility] complaining of urinary frequency onset 1- 1.5 months. Pt reports urinating about 20 times a day. He reports associated  urgency, worsening urine drip after urination, urinary retention, occasional back pain varying from right to left. He reports hx of frequent UTI's in his 20's. Last prostate check was 8 months ago; no family hx of prostate cancer.  He has hx of diabetes which he states has been well controlled. He started on a new diabetes medication 8 months. He also started Lasix 8 months ago.  He denies fever, chills. dysuria, penile discharge, rash, itching, bowel symptoms. Hx of hernia repar  PCP-Dr. Juleen China   There are no active problems to display for this patient.  Past Medical History:  Diagnosis Date  . Cataract   . Diabetes mellitus without complication (HCC)   . Hypertension    Past Surgical History:  Procedure Laterality Date  . APPENDECTOMY    . HERNIA REPAIR    . VASECTOMY     Allergies  Allergen Reactions  . Sulfa Antibiotics Rash   Prior to Admission medications   Medication Sig Start Date End Date Taking? Authorizing Provider  amLODipine (NORVASC) 10 MG tablet Take 10 mg by mouth daily.   Yes Historical Provider, MD  aspirin 325 MG tablet Take 325 mg by mouth daily.   Yes Historical Provider, MD  atorvastatin (LIPITOR) 20 MG tablet Take 20 mg by mouth daily.   Yes Historical Provider, MD  Cinnamon 500 MG capsule Take 500 mg by mouth daily.   Yes Historical Provider, MD  clonazePAM  (KLONOPIN) 0.5 MG tablet Take 0.5 mg by mouth 2 (two) times daily as needed for anxiety.   Yes Historical Provider, MD  fish oil-omega-3 fatty acids 1000 MG capsule Take 2 g by mouth daily.   Yes Historical Provider, MD  glimepiride (AMARYL) 2 MG tablet Take 2 mg by mouth 2 (two) times daily.   Yes Historical Provider, MD  hydrochlorothiazide (HYDRODIURIL) 25 MG tablet Take 25 mg by mouth daily.   Yes Historical Provider, MD  lisinopril (PRINIVIL,ZESTRIL) 40 MG tablet Take 40 mg by mouth 2 (two) times daily.   Yes Historical Provider, MD  metFORMIN (GLUMETZA) 1000 MG (MOD) 24 hr tablet Take 1,000 mg by mouth 2 (two) times daily.   Yes Historical Provider, MD  metoprolol (LOPRESSOR) 100 MG tablet Take 100 mg by mouth 2 (two) times daily.   Yes Historical Provider, MD   Social History   Social History  . Marital status: Married    Spouse name: N/A  . Number of children: N/A  . Years of education: N/A   Occupational History  . Not on file.   Social History Main Topics  . Smoking status: Never Smoker  . Smokeless tobacco: Not on file  . Alcohol use No  . Drug use: No  . Sexual activity: Yes    Birth control/ protection: Surgical   Other Topics Concern  . Not on file  Social History Narrative  . No narrative on file   Review of Systems  Constitutional: Negative for chills and fever.  Gastrointestinal: Negative for abdominal distention, abdominal pain, blood in stool, constipation and diarrhea.  Genitourinary: Positive for flank pain, frequency and urgency. Negative for difficulty urinating, dysuria and hematuria.  Musculoskeletal: Positive for back pain.  Skin: Negative for rash.       Objective:   Physical Exam  Constitutional: He is oriented to person, place, and time. He appears well-developed and well-nourished. No distress.  HENT:  Head: Normocephalic and atraumatic.  Eyes: Conjunctivae and EOM are normal.  Neck: Neck supple.  Cardiovascular: Normal rate and regular  rhythm.   Murmur ( left upper sternal border) heard.  Systolic murmur is present with a grade of 2/6  Pulmonary/Chest: Effort normal and breath sounds normal.  Abdominal: He exhibits distension. There is no CVA tenderness.  Left lower moderate distention with firmness  Musculoskeletal: Normal range of motion.  Neurological: He is alert and oriented to person, place, and time.  Skin: Skin is warm and dry.  Psychiatric: He has a normal mood and affect. His behavior is normal.  Nursing note and vitals reviewed.   Vitals:   12/12/16 1103  BP: 122/72  Pulse: 72  Resp: 17  Temp: 98.2 F (36.8 C)  TempSrc: Oral  SpO2: 97%  Weight: 187 lb (84.8 kg)  Height: 5' 6.5" (1.689 m)     Results for orders placed or performed in visit on 12/12/16  POCT Microscopic Urinalysis ([facility])  Result Value Ref Range   WBC,UR,HPF,POC None None WBC/hpf   RBC,UR,HPF,POC None None RBC/hpf   Bacteria None None, Too numerous to count   Mucus Absent Absent   Epithelial Cells, UR Per Microscopy Few (A) None, Too numerous to count cells/hpf  POCT urinalysis dipstick  Result Value Ref Range   Color, UA yellow yellow   Clarity, UA clear clear   Glucose, UA =500 (A) negative   Bilirubin, UA negative negative   Ketones, POC UA negative negative   Spec Grav, UA 1.015    Blood, UA negative negative   pH, UA 5.5    Protein Ur, POC =100 (A) negative   Urobilinogen, UA 0.2    Nitrite, UA Negative Negative   Leukocytes, UA Negative Negative    Assessment & York:   1. Dysuria - cover for UTI due to sxs with glycosuria  2. Urinary frequency   3. Type 2 diabetes mellitus without complication, without long-term current use of insulin (HCC)     Orders Placed This Encounter  Procedures  . Urine culture  . POCT Microscopic Urinalysis ([facility])  . POCT urinalysis dipstick    Meds ordered this encounter  Medications  . furosemide (LASIX) 20 MG tablet    Sig: Take 1 tablet (20 mg total) by mouth daily.     Dispense:  1 tablet    Refill:  0  . DISCONTD: tamsulosin (FLOMAX) 0.4 MG CAPS capsule    Sig: Take 1 capsule (0.4 mg total) by mouth daily.    Dispense:  30 capsule    Refill:  3  . DISCONTD: phenazopyridine (PYRIDIUM) 200 MG tablet    Sig: Take 1 tablet (200 mg total) by mouth 3 (three) times daily as needed for pain.    Dispense:  10 tablet    Refill:  0  . tamsulosin (FLOMAX) 0.4 MG CAPS capsule    Sig: Take 1 capsule (0.4 mg total) by mouth daily.  Dispense:  30 capsule    Refill:  3  . phenazopyridine (PYRIDIUM) 200 MG tablet    Sig: Take 1 tablet (200 mg total) by mouth 3 (three) times daily as needed for pain.    Dispense:  10 tablet    Refill:  0  . ciprofloxacin (CIPRO) 500 MG tablet    Sig: Take 1 tablet (500 mg total) by mouth 2 (two) times daily.    Dispense:  14 tablet    Refill:  0    I personally performed the services described in this documentation, which was scribed in my presence. The recorded information has been reviewed and considered, and addended by me as needed.   Norberto Sorenson, M.D.  [facility] [address] [place], [place] [address] [phone] phone [phone] fax  12/28/16 12:33 AM

## 2016-12-14 LAB — URINE CULTURE

## 2017-02-22 ENCOUNTER — Encounter (HOSPITAL_COMMUNITY): Payer: Self-pay | Admitting: *Deleted

## 2017-02-22 ENCOUNTER — Ambulatory Visit (HOSPITAL_COMMUNITY)
Admission: EM | Admit: 2017-02-22 | Discharge: 2017-02-22 | Disposition: A | Discharge status: Home / Self Care | Payer: PPO | Attending: Family Medicine | Admitting: Family Medicine

## 2017-02-22 DIAGNOSIS — M109 Gout, unspecified: Secondary | ICD-10-CM | POA: Diagnosis not present

## 2017-02-22 MED ORDER — INDOMETHACIN 25 MG PO CAPS: 25.0000 mg | ORAL_CAPSULE | Freq: Three times a day (TID) | ORAL | 0 refills | Status: DC | PRN

## 2017-02-22 NOTE — ED Provider Notes (Signed)
[facility]    CSN: [id number] Arrival date & time: 02/22/17  1003  History   Chief Complaint Chief Complaint  Patient presents with  . Foot Pain   HPI Jerry York is a 66 y.o. male with a history of NIDDM, HTN presenting for evaluation of right foot pain. He reports relatively abrupt onset of right great toe pain and swelling 4 days ago that has remained constant, moderate-severe, nonradiating, worse with walking or palpation, mildly improved with ibuprofen 400mg . He denies any preceding trauma or illness, changes in medication, or history of this in the past. His blood sugars have been well controlled, frequently < 180mg /dl.   Past Medical History:  Diagnosis Date  . Cataract   . Diabetes mellitus without complication (HCC)   . Hypertension    Past Surgical History:  Procedure Laterality Date  . APPENDECTOMY    . HERNIA REPAIR    . VASECTOMY      Home Medications    Prior to Admission medications   Medication Sig Start Date End Date Taking? Authorizing Provider  amLODipine (NORVASC) 10 MG tablet Take 10 mg by mouth daily.    Historical Provider, MD  aspirin 325 MG tablet Take 325 mg by mouth daily.    Historical Provider, MD  atorvastatin (LIPITOR) 20 MG tablet Take 20 mg by mouth daily.    Historical Provider, MD  Cinnamon 500 MG capsule Take 500 mg by mouth daily.    Historical Provider, MD  ciprofloxacin (CIPRO) 500 MG tablet Take 1 tablet (500 mg total) by mouth 2 (two) times daily. 12/12/16   , MD  clonazePAM (KLONOPIN) 0.5 MG tablet Take 0.5 mg by mouth 2 (two) times daily as needed for anxiety.    Historical Provider, MD  fish oil-omega-3 fatty acids 1000 MG capsule Take 2 g by mouth daily.    Historical Provider, MD  furosemide (LASIX) 20 MG tablet Take 1 tablet (20 mg total) by mouth daily. 12/12/16   Sherren Mocha, MD  glimepiride (AMARYL) 2 MG tablet Take 2 mg by mouth 2 (two) times daily.    Historical Provider, MD  indomethacin (INDOCIN)  25 MG capsule Take 1 capsule (25 mg total) by mouth 3 (three) times daily as needed. 02/22/17   Sherren Mocha, MD  lisinopril (PRINIVIL,ZESTRIL) 40 MG tablet Take 40 mg by mouth 2 (two) times daily.    Historical Provider, MD  metFORMIN (GLUMETZA) 1000 MG (MOD) 24 hr tablet Take 1,000 mg by mouth 2 (two) times daily.    Historical Provider, MD  metoprolol (LOPRESSOR) 100 MG tablet Take 100 mg by mouth 2 (two) times daily.    Historical Provider, MD  phenazopyridine (PYRIDIUM) 200 MG tablet Take 1 tablet (200 mg total) by mouth 3 (three) times daily as needed for pain. 12/12/16   Tyrone Nine, MD  tamsulosin (FLOMAX) 0.4 MG CAPS capsule Take 1 capsule (0.4 mg total) by mouth daily. 12/12/16   Sherren Mocha, MD    Family History Family History  Problem Relation Age of Onset  . Congestive Heart Failure Mother   . Hypertension Brother   . Hypertension Daughter   . Hypertension Son     Social History Social History  Substance Use Topics  . Smoking status: Never Smoker  . Smokeless tobacco: Not on file  . Alcohol use No     Allergies   Sulfa antibiotics   Review of Systems Review of Systems No fevers, no other arthralgias,  rashes, myalgias.  Physical Exam Triage Vital Signs ED Triage Vitals  Enc Vitals Group     BP      Pulse      Resp      Temp      Temp src      SpO2      Weight      Height      Head Circumference      Peak Flow      Pain Score      Pain Loc      Pain Edu?      Excl. in GC?    No data found.  Physical Exam BP 158/78 (BP Location: Right Arm)   Pulse 78   Temp 98.6 F (37 C)   Resp 18   SpO2 100%  Gen: Well-appearing 65 y.o.male in NAD Right foot: 1st MTP erythematous and mildly swollen very tender to palpation. Full active ROM with pain. Calluses on sole and medial great toe without ulceration. Sensation intact. DP pulse 2+. 1+ pitting edema to the lower calf.  Left foot: No deformities, full AROM without swelling or tenderness. Sensation intact.  DP pulse 2+. 1+ pitting edema to the lower calf.   UC Treatments / Results  Labs (all labs ordered are listed, but only abnormal results are displayed) Labs Reviewed - No data to display  EKG  EKG Interpretation None       Radiology No results found.  Procedures Procedures (including critical care time)  Medications Ordered in UC Medications - No data to display   Initial Impression / Assessment and Plan / UC Course  I have reviewed the triage vital signs and the nursing notes.  Pertinent labs & imaging results that were available during my care of the patient were reviewed by me and considered in my medical decision making (see chart for details).  Jerry York is a 66 y.o. male with controlled diabetes, HTN, obesity presenting for redness, pain and swelling of right 1st MTP joint consistent with initial gout attack. Given that onset was 4 days ago, outside window of benefit for colchicine. Will Rx NSAID (no CKD). Discussed follow up with PCP for initiation of ULT and urate measurement. Return precautions advised.   Final Clinical Impressions(s) / UC Diagnoses   Final diagnoses:  Acute gout involving toe of right foot, unspecified cause   New Prescriptions New Prescriptions   INDOMETHACIN (INDOCIN) 25 MG CAPSULE    Take 1 capsule (25 mg total) by mouth 3 (three) times daily as needed.     Tyrone Nine, MD 02/22/17 [phone]

## 2017-02-22 NOTE — ED Triage Notes (Signed)
Pt     Reports    Pain  r  Foot   X  5  Days     denys  Any  specefic  Injury  Pt reports  Pain on  Weight  Bearing    Pt is  Diabetic  Takes  Oral meds

## 2017-03-14 DIAGNOSIS — E118 Type 2 diabetes mellitus with unspecified complications: Secondary | ICD-10-CM | POA: Diagnosis not present

## 2017-03-14 DIAGNOSIS — E789 Disorder of lipoprotein metabolism, unspecified: Secondary | ICD-10-CM | POA: Diagnosis not present

## 2017-03-14 DIAGNOSIS — H6120 Impacted cerumen, unspecified ear: Secondary | ICD-10-CM | POA: Diagnosis not present

## 2017-03-21 DIAGNOSIS — E789 Disorder of lipoprotein metabolism, unspecified: Secondary | ICD-10-CM | POA: Diagnosis not present

## 2017-03-21 DIAGNOSIS — N289 Disorder of kidney and ureter, unspecified: Secondary | ICD-10-CM | POA: Diagnosis not present

## 2017-03-21 DIAGNOSIS — E118 Type 2 diabetes mellitus with unspecified complications: Secondary | ICD-10-CM | POA: Diagnosis not present

## 2017-03-21 DIAGNOSIS — I10 Essential (primary) hypertension: Secondary | ICD-10-CM | POA: Diagnosis not present

## 2017-03-21 DIAGNOSIS — N390 Urinary tract infection, site not specified: Secondary | ICD-10-CM | POA: Diagnosis not present

## 2017-03-22 ENCOUNTER — Other Ambulatory Visit: Payer: Self-pay | Admitting: Endocrinology

## 2017-03-22 DIAGNOSIS — N19 Unspecified kidney failure: Secondary | ICD-10-CM

## 2017-03-27 DIAGNOSIS — Z1211 Encounter for screening for malignant neoplasm of colon: Secondary | ICD-10-CM | POA: Diagnosis not present

## 2017-03-27 DIAGNOSIS — Z1212 Encounter for screening for malignant neoplasm of rectum: Secondary | ICD-10-CM | POA: Diagnosis not present

## 2017-03-28 ENCOUNTER — Ambulatory Visit
Admission: RE | Admit: 2017-03-28 | Discharge: 2017-03-28 | Disposition: A | Discharge status: Home / Self Care | Payer: PPO | Source: Ambulatory Visit | Attending: Endocrinology | Admitting: Endocrinology

## 2017-03-28 DIAGNOSIS — N19 Unspecified kidney failure: Secondary | ICD-10-CM

## 2017-03-28 DIAGNOSIS — N189 Chronic kidney disease, unspecified: Secondary | ICD-10-CM | POA: Diagnosis not present

## 2017-04-19 DIAGNOSIS — E118 Type 2 diabetes mellitus with unspecified complications: Secondary | ICD-10-CM | POA: Diagnosis not present

## 2017-04-19 LAB — BASIC METABOLIC PANEL
BUN: 61 — AB (ref 4–21)
CREATININE: 2.3 — AB (ref <=1.3)
GLUCOSE: 189
SODIUM: 143 (ref 137–147)

## 2017-04-19 LAB — CBC AND DIFFERENTIAL: WBC: 5.6

## 2017-04-26 DIAGNOSIS — I10 Essential (primary) hypertension: Secondary | ICD-10-CM | POA: Diagnosis not present

## 2017-04-26 DIAGNOSIS — N289 Disorder of kidney and ureter, unspecified: Secondary | ICD-10-CM | POA: Diagnosis not present

## 2017-04-26 DIAGNOSIS — E118 Type 2 diabetes mellitus with unspecified complications: Secondary | ICD-10-CM | POA: Diagnosis not present

## 2017-04-26 DIAGNOSIS — N400 Enlarged prostate without lower urinary tract symptoms: Secondary | ICD-10-CM | POA: Diagnosis not present

## 2017-04-27 DIAGNOSIS — N138 Other obstructive and reflux uropathy: Secondary | ICD-10-CM | POA: Diagnosis not present

## 2017-04-27 DIAGNOSIS — N281 Cyst of kidney, acquired: Secondary | ICD-10-CM | POA: Diagnosis not present

## 2017-04-27 DIAGNOSIS — N401 Enlarged prostate with lower urinary tract symptoms: Secondary | ICD-10-CM | POA: Diagnosis not present

## 2017-04-27 DIAGNOSIS — N39490 Overflow incontinence: Secondary | ICD-10-CM | POA: Diagnosis not present

## 2017-04-27 DIAGNOSIS — N1339 Other hydronephrosis: Secondary | ICD-10-CM | POA: Diagnosis not present

## 2017-04-27 DIAGNOSIS — R338 Other retention of urine: Secondary | ICD-10-CM | POA: Diagnosis not present

## 2017-05-01 DIAGNOSIS — R338 Other retention of urine: Secondary | ICD-10-CM | POA: Diagnosis not present

## 2017-05-05 ENCOUNTER — Other Ambulatory Visit: Payer: Self-pay | Admitting: Urology

## 2017-05-05 ENCOUNTER — Encounter (HOSPITAL_BASED_OUTPATIENT_CLINIC_OR_DEPARTMENT_OTHER): Payer: Self-pay | Admitting: *Deleted

## 2017-05-05 DIAGNOSIS — N138 Other obstructive and reflux uropathy: Secondary | ICD-10-CM | POA: Diagnosis not present

## 2017-05-05 DIAGNOSIS — N39490 Overflow incontinence: Secondary | ICD-10-CM | POA: Diagnosis not present

## 2017-05-05 DIAGNOSIS — N134 Hydroureter: Secondary | ICD-10-CM | POA: Diagnosis not present

## 2017-05-05 DIAGNOSIS — N1330 Unspecified hydronephrosis: Secondary | ICD-10-CM | POA: Diagnosis not present

## 2017-05-05 NOTE — Progress Notes (Signed)
NPO AFTER MN.  ARRIVE AT 0615.  NEEDS ISTAT 8 AND EKG.  WILL BRING METOPROLOL MEDICATION BOTTLE.  PT STATED THAT WAS TOLD BY PA AT [facility] TO STOP TAKING HIS BP MEDICATION DUE TO HYPOTENSION DUE TO BLOOD LOSS IN URINE.  PT STATED BP HAS BEEN RUNNING 80's to 95 SYSTOLIC AND DIASTOLIC 40's to 83'M. SO, ASK MDA DOS IF PT NEEDS TO TAKE METOPROLOL.

## 2017-05-08 ENCOUNTER — Ambulatory Visit (HOSPITAL_BASED_OUTPATIENT_CLINIC_OR_DEPARTMENT_OTHER)
Admission: RE | Admit: 2017-05-08 | Discharge: 2017-05-08 | Disposition: A | Discharge status: Home / Self Care | Payer: PPO | Source: Ambulatory Visit | Attending: Urology | Admitting: Urology

## 2017-05-08 ENCOUNTER — Ambulatory Visit (HOSPITAL_BASED_OUTPATIENT_CLINIC_OR_DEPARTMENT_OTHER): Payer: PPO | Admitting: Anesthesiology

## 2017-05-08 ENCOUNTER — Encounter (HOSPITAL_BASED_OUTPATIENT_CLINIC_OR_DEPARTMENT_OTHER)
Admission: RE | Disposition: A | Discharge status: Home / Self Care | Payer: Self-pay | Source: Ambulatory Visit | Attending: Urology

## 2017-05-08 ENCOUNTER — Encounter (HOSPITAL_BASED_OUTPATIENT_CLINIC_OR_DEPARTMENT_OTHER): Payer: Self-pay | Admitting: *Deleted

## 2017-05-08 DIAGNOSIS — Z882 Allergy status to sulfonamides status: Secondary | ICD-10-CM | POA: Diagnosis not present

## 2017-05-08 DIAGNOSIS — Z841 Family history of disorders of kidney and ureter: Secondary | ICD-10-CM | POA: Diagnosis not present

## 2017-05-08 DIAGNOSIS — R319 Hematuria, unspecified: Secondary | ICD-10-CM | POA: Insufficient documentation

## 2017-05-08 DIAGNOSIS — F419 Anxiety disorder, unspecified: Secondary | ICD-10-CM | POA: Insufficient documentation

## 2017-05-08 DIAGNOSIS — R3916 Straining to void: Secondary | ICD-10-CM | POA: Diagnosis not present

## 2017-05-08 DIAGNOSIS — N401 Enlarged prostate with lower urinary tract symptoms: Secondary | ICD-10-CM | POA: Diagnosis not present

## 2017-05-08 DIAGNOSIS — R3912 Poor urinary stream: Secondary | ICD-10-CM | POA: Insufficient documentation

## 2017-05-08 DIAGNOSIS — R3911 Hesitancy of micturition: Secondary | ICD-10-CM | POA: Insufficient documentation

## 2017-05-08 DIAGNOSIS — R011 Cardiac murmur, unspecified: Secondary | ICD-10-CM | POA: Diagnosis not present

## 2017-05-08 DIAGNOSIS — E119 Type 2 diabetes mellitus without complications: Secondary | ICD-10-CM | POA: Insufficient documentation

## 2017-05-08 DIAGNOSIS — Z466 Encounter for fitting and adjustment of urinary device: Secondary | ICD-10-CM | POA: Diagnosis not present

## 2017-05-08 DIAGNOSIS — N1330 Unspecified hydronephrosis: Secondary | ICD-10-CM

## 2017-05-08 DIAGNOSIS — R338 Other retention of urine: Secondary | ICD-10-CM | POA: Diagnosis not present

## 2017-05-08 DIAGNOSIS — R351 Nocturia: Secondary | ICD-10-CM | POA: Insufficient documentation

## 2017-05-08 DIAGNOSIS — M1990 Unspecified osteoarthritis, unspecified site: Secondary | ICD-10-CM | POA: Diagnosis not present

## 2017-05-08 DIAGNOSIS — I10 Essential (primary) hypertension: Secondary | ICD-10-CM | POA: Insufficient documentation

## 2017-05-08 DIAGNOSIS — E7800 Pure hypercholesterolemia, unspecified: Secondary | ICD-10-CM | POA: Insufficient documentation

## 2017-05-08 DIAGNOSIS — R339 Retention of urine, unspecified: Secondary | ICD-10-CM | POA: Diagnosis not present

## 2017-05-08 HISTORY — DX: Cardiac murmur, unspecified: R01.1

## 2017-05-08 HISTORY — DX: Anxiety disorder, unspecified: F41.9

## 2017-05-08 HISTORY — PX: CYSTOSCOPY/RETROGRADE/URETEROSCOPY: SHX5316

## 2017-05-08 HISTORY — DX: Hyperlipidemia, unspecified: E78.5

## 2017-05-08 HISTORY — DX: Unspecified hydronephrosis: N13.30

## 2017-05-08 HISTORY — DX: Type 2 diabetes mellitus without complications: E11.9

## 2017-05-08 HISTORY — DX: Disorder of kidney and ureter, unspecified: N28.9

## 2017-05-08 HISTORY — DX: Presence of urogenital implants: Z96.0

## 2017-05-08 HISTORY — DX: Gout, unspecified: M10.9

## 2017-05-08 HISTORY — DX: Cyst of kidney, acquired: N28.1

## 2017-05-08 HISTORY — PX: CYSTOSCOPY WITH URETEROSCOPY AND STENT PLACEMENT: SHX6377

## 2017-05-08 HISTORY — DX: Gross hematuria: R31.0

## 2017-05-08 HISTORY — DX: Retention of urine, unspecified: R33.9

## 2017-05-08 HISTORY — DX: Presence of spectacles and contact lenses: Z97.3

## 2017-05-08 LAB — GLUCOSE, CAPILLARY: Glucose-Capillary: 108 mg/dL — ABNORMAL HIGH (ref 65–99)

## 2017-05-08 SURGERY — CYSTOSCOPY/RETROGRADE/URETEROSCOPY
Anesthesia: General | Site: Renal | Laterality: Left

## 2017-05-08 MED ORDER — FENTANYL CITRATE (PF) 100 MCG/2ML IJ SOLN
INTRAMUSCULAR | Status: DC | PRN
  Administered 2017-05-08: 50 ug via INTRAVENOUS
  Administered 2017-05-08 (×2): 25 ug via INTRAVENOUS

## 2017-05-08 MED ORDER — FENTANYL CITRATE (PF) 100 MCG/2ML IJ SOLN
INTRAMUSCULAR | Status: AC
  Filled 2017-05-08: qty 2

## 2017-05-08 MED ORDER — PROPOFOL 10 MG/ML IV BOLUS
INTRAVENOUS | Status: DC | PRN
  Administered 2017-05-08: 150 mg via INTRAVENOUS

## 2017-05-08 MED ORDER — STERILE WATER FOR IRRIGATION IR SOLN
Status: DC | PRN
  Administered 2017-05-08: 500 mL

## 2017-05-08 MED ORDER — BELLADONNA ALKALOIDS-OPIUM 16.2-60 MG RE SUPP
RECTAL | Status: DC | PRN
  Administered 2017-05-08: 1 via RECTAL

## 2017-05-08 MED ORDER — DEXAMETHASONE SODIUM PHOSPHATE 4 MG/ML IJ SOLN
INTRAMUSCULAR | Status: DC | PRN
  Administered 2017-05-08: 10 mg via INTRAVENOUS

## 2017-05-08 MED ORDER — GENTAMICIN SULFATE 40 MG/ML IJ SOLN
5.0000 mg/kg | INTRAVENOUS | Status: AC
  Administered 2017-05-08: 420 mg via INTRAVENOUS
  Filled 2017-05-08 (×2): qty 10.5

## 2017-05-08 MED ORDER — SODIUM CHLORIDE 0.9 % IR SOLN
Status: DC | PRN
  Administered 2017-05-08: 3000 mL via INTRAVESICAL
  Administered 2017-05-08: 1000 mL via INTRAVESICAL

## 2017-05-08 MED ORDER — OXYCODONE HCL 5 MG PO TABS
5.0000 mg | ORAL_TABLET | Freq: Once | ORAL | Status: DC | PRN
  Filled 2017-05-08: qty 1

## 2017-05-08 MED ORDER — MIDAZOLAM HCL 5 MG/5ML IJ SOLN
INTRAMUSCULAR | Status: DC | PRN
  Administered 2017-05-08: 2 mg via INTRAVENOUS

## 2017-05-08 MED ORDER — CIPROFLOXACIN HCL 500 MG PO TABS
500.0000 mg | ORAL_TABLET | Freq: Two times a day (BID) | ORAL | 0 refills | Status: DC
Start: 2017-05-08 — End: 2017-08-10

## 2017-05-08 MED ORDER — TOLTERODINE TARTRATE ER 4 MG PO CP24: 4.0000 mg | ORAL_CAPSULE | Freq: Every day | ORAL | 0 refills | Status: DC

## 2017-05-08 MED ORDER — ONDANSETRON HCL 4 MG/2ML IJ SOLN
INTRAMUSCULAR | Status: AC
  Filled 2017-05-08: qty 2

## 2017-05-08 MED ORDER — LACTATED RINGERS IV SOLN
INTRAVENOUS | Status: DC
  Administered 2017-05-08 (×2): via INTRAVENOUS
  Filled 2017-05-08: qty 1000

## 2017-05-08 MED ORDER — LIDOCAINE 2% (20 MG/ML) 5 ML SYRINGE
INTRAMUSCULAR | Status: AC
  Filled 2017-05-08: qty 5

## 2017-05-08 MED ORDER — ONDANSETRON HCL 4 MG/2ML IJ SOLN
INTRAMUSCULAR | Status: DC | PRN
  Administered 2017-05-08: 4 mg via INTRAVENOUS

## 2017-05-08 MED ORDER — PROPOFOL 10 MG/ML IV BOLUS
INTRAVENOUS | Status: AC
  Filled 2017-05-08: qty 40

## 2017-05-08 MED ORDER — BELLADONNA ALKALOIDS-OPIUM 16.2-60 MG RE SUPP
RECTAL | Status: AC
  Filled 2017-05-08: qty 1

## 2017-05-08 MED ORDER — FENTANYL CITRATE (PF) 100 MCG/2ML IJ SOLN
25.0000 ug | INTRAMUSCULAR | Status: DC | PRN
Start: 2017-05-08 — End: 2017-05-08
  Administered 2017-05-08 (×2): 25 ug via INTRAVENOUS
  Administered 2017-05-08: 50 ug via INTRAVENOUS
  Filled 2017-05-08: qty 1

## 2017-05-08 MED ORDER — LIDOCAINE 2% (20 MG/ML) 5 ML SYRINGE
INTRAMUSCULAR | Status: DC | PRN
  Administered 2017-05-08: 100 mg via INTRAVENOUS

## 2017-05-08 MED ORDER — PHENYLEPHRINE 40 MCG/ML (10ML) SYRINGE FOR IV PUSH (FOR BLOOD PRESSURE SUPPORT)
PREFILLED_SYRINGE | INTRAVENOUS | Status: DC | PRN
  Administered 2017-05-08 (×2): 120 ug via INTRAVENOUS

## 2017-05-08 MED ORDER — MIDAZOLAM HCL 2 MG/2ML IJ SOLN
INTRAMUSCULAR | Status: AC
  Filled 2017-05-08: qty 2

## 2017-05-08 MED ORDER — DEXAMETHASONE SODIUM PHOSPHATE 10 MG/ML IJ SOLN
INTRAMUSCULAR | Status: AC
  Filled 2017-05-08: qty 1

## 2017-05-08 MED ORDER — IOHEXOL 300 MG/ML  SOLN
INTRAMUSCULAR | Status: DC | PRN
  Administered 2017-05-08: 28 mL via URETHRAL

## 2017-05-08 MED ORDER — OXYCODONE HCL 5 MG/5ML PO SOLN
5.0000 mg | Freq: Once | ORAL | Status: DC | PRN
  Filled 2017-05-08: qty 5

## 2017-05-08 MED ORDER — PHENYLEPHRINE 40 MCG/ML (10ML) SYRINGE FOR IV PUSH (FOR BLOOD PRESSURE SUPPORT)
PREFILLED_SYRINGE | INTRAVENOUS | Status: AC
  Filled 2017-05-08: qty 10

## 2017-05-08 MED ORDER — ARTIFICIAL TEARS OPHTHALMIC OINT
TOPICAL_OINTMENT | OPHTHALMIC | Status: AC
  Filled 2017-05-08: qty 3.5

## 2017-05-08 SURGICAL SUPPLY — 21 items
BAG DRAIN URO-CYSTO SKYTR STRL (DRAIN) ×3 IMPLANT
BAG DRN UROCATH (DRAIN) ×2
BAG URINE DRAINAGE (UROLOGICAL SUPPLIES) ×1 IMPLANT
CATH FOLEY 2WAY SLVR  5CC 16FR (CATHETERS) ×1
CATH FOLEY 2WAY SLVR 5CC 16FR (CATHETERS) IMPLANT
CATH INTERMIT  6FR 70CM (CATHETERS) ×1 IMPLANT
CLOTH BEACON ORANGE TIMEOUT ST (SAFETY) ×5 IMPLANT
GLOVE BIO SURGEON STRL SZ8 (GLOVE) ×3 IMPLANT
GOWN STRL REUS W/ TWL XL LVL3 (GOWN DISPOSABLE) ×2 IMPLANT
GOWN STRL REUS W/TWL XL LVL3 (GOWN DISPOSABLE) ×6
GUIDEWIRE ANG ZIPWIRE 038X150 (WIRE) ×1 IMPLANT
GUIDEWIRE STR DUAL SENSOR (WIRE) ×1 IMPLANT
HOLDER FOLEY CATH W/STRAP (MISCELLANEOUS) ×1 IMPLANT
IV NS IRRIG 3000ML ARTHROMATIC (IV SOLUTION) ×6 IMPLANT
KIT RM TURNOVER CYSTO AR (KITS) ×3 IMPLANT
MANIFOLD NEPTUNE II (INSTRUMENTS) ×1 IMPLANT
PACK CYSTO (CUSTOM PROCEDURE TRAY) ×3 IMPLANT
SHEATH ACCESS URETERAL 24CM (SHEATH) ×1 IMPLANT
STENT URET 6FRX26 CONTOUR (STENTS) ×1 IMPLANT
SYRINGE 10CC LL (SYRINGE) ×2 IMPLANT
TUBE CONNECTING 12X1/4 (SUCTIONS) ×1 IMPLANT

## 2017-05-08 NOTE — Op Note (Signed)
PATIENT:  Melene Plan  PRE-OPERATIVE DIAGNOSIS: 1. Urinary retention 2. Persistent left hydronephrosis 3. History of obstructive uropathy  POST-OPERATIVE DIAGNOSIS: Same  PROCEDURE: 1. Cystoscopy with bilateral retrograde pyelograms including interpretation 2. Left diagnostic ureteroscopy. 3. Left double-J stent placement 4. Fluoroscopy time 1 hour  SURGEON:  [place]  INDICATION: QUASHAUN LAZALDE is a 66 year old male who was found to have bilateral hydronephrosis on an ultrasound due to an elevation of his creatinine from below 1 up to 2.1 in 3/18. He was found to have a normal prostate by exam but 3 L within his bladder and a Foley catheter was inserted. He had developed some hematuria and was evaluated with a noncontrasted CT scan after a follow-up renal ultrasound revealed resolution of his right hydronephrosis with Foley catheter drainage but persistent left hydronephrosis. No stones were seen on the CT scan and therefore he is brought to the operating room for further evaluation of the cause of his left hydronephrosis that has persisted despite catheter drainage and stent placement.  ANESTHESIA:  General  EBL:  Minimal  DRAINS: 6 French, 26 cm double-J stent in the left ureter (no string)  LOCAL MEDICATIONS USED:  None  SPECIMEN:  None  Description of procedure: After informed consent the patient was taken to the operating room and placed on the table in a supine position. General anesthesia was then administered. Once fully anesthetized the patient was moved to the dorsal lithotomy position and the genitalia were sterilely prepped and draped in standard fashion. An official timeout was then performed.  Initially the 23 French cystoscope with 30 lens was passed under direct vision down the urethra which is noted be entirely normal. The prostatic urethra revealed bilobar hypertrophy with some mild median lobe enlargement but obstructing lateral lobes and elongation of  the prostatic urethra. There were no lesions. Upon entering the bladder I noted trabeculation throughout and significant inflammatory changes involving the trigone and ureteral orifices on both sides but more prominent on the left. There were no papillary lesions or stones. I filled his bladder to capacity under anesthesia and found it held 1300 mL.  I switched to the 70 lens and used the Norco and bridge in order to cannulate the left ureteral orifice. This was achieved by passing a Glidewire through the 6 [place] open-ended ureteral catheter into the left UO and then advancing the open-ended catheter over the guidewire. I removed the guidewire and then performed a left retrograde pyelogram in the standard fashion.  Full-strength Omnipaque contrast was then injected through the open ended catheter and up the left ureter under direct fluoroscopy. This revealed J hooking of the left distal ureter with marked tortuosity and dilatation of the ureter throughout its length with marked persistent right hydronephrosis noted. There were no mass effects or filling defects identified. I therefore passed the Glidewire through the open-ended catheter and was able to negotiate this through the tortuous ureter into the area the renal pelvis. I passed the open-ended catheter over the guidewire and then removed the Glidewire replacing it with a floppy tip sensor guidewire for better control. This was left in place and the cystoscope and open-ended catheter were removed and I proceeded with left ureteroscopy. I dilated the intramural ureter with the inner portion of a ureteral access sheath to aid in ureteroscopy.    The 6 French needle tip ureteroscope was then passed under direct vision down in to the bladder . I advanced the ureteroscope the scope was passed next  to the guidewire exiting the left UO and I noted no abnormality other then what appeared to be some slight inflammatory changes just inside the ureteral orifice  but no worrisome lesions were found within the distal ureter. I advanced the ureteroscope beside the guidewire up the entire length of the left ureter noting no lesions within the ureter area I also noted that that the guidewire remained within the lumen throughout its course to the renal pelvis with no areas of submucosal passage. I therefore removed the ureteroscope and backloaded the cystoscope over the guidewire. I then passed the 6 [place], 26 cm double-J stent over the guidewire into the area of the upper pole of the left kidney and noted good curl in this location as the guidewire was removed as well as good curl in the bladder.  I then identified the right ureteral orifice which appeared to have less inflammation surrounding this although there was inflammation on this side as well. The ureteral orifice was cannulated with 6 [place] open-ended ureteral catheter and I performed a right retrograde pyelogram in an identical fashion as above. This revealed J hooking of the distal ureter with some tortuosity although somewhat less compared to the contralateral side. There were no lesions within the ureter or intrarenal collecting system.  I therefore removed the cystoscope and placed a new 16 French Foley catheter which was connected to closed system drainage. The patient received a B&O suppository and was awakened and taken to the recovery room in stable and satisfactory condition. He tolerated the procedure well with no intraoperative complications.     PLAN OF CARE: Discharge to home after PACU  PATIENT DISPOSITION:  PACU - hemodynamically stable.

## 2017-05-08 NOTE — Anesthesia Postprocedure Evaluation (Addendum)
Anesthesia Post Note  Patient: TENNESSEE PERRA  Procedure(s) Performed: Procedure(s) (LRB): CYSTOSCOPY/ BILATERAL RETROGRADE (Bilateral) URETEROSCOPY AND STENT PLACEMENT (Left)  Patient location during evaluation: PACU Anesthesia Type: General Level of consciousness: awake and alert Pain management: pain level controlled Vital Signs Assessment: post-procedure vital signs reviewed and stable Respiratory status: spontaneous breathing, nonlabored ventilation, respiratory function stable and patient connected to nasal cannula oxygen Cardiovascular status: blood pressure returned to baseline and stable Postop Assessment: no signs of nausea or vomiting Anesthetic complications: no       Last Vitals:  Vitals:   05/08/17 0945 05/08/17 1027  BP: 130/73 125/60  Pulse: 67 72  Resp: 15 16  Temp:  36.7 C    Last Pain:  Vitals:   05/08/17 1027  TempSrc:   PainSc: 0-No pain                 Tahmid Stonehocker

## 2017-05-08 NOTE — Anesthesia Preprocedure Evaluation (Signed)
Anesthesia Evaluation  Patient identified by MRN, date of birth, ID band Patient awake    Reviewed: Allergy & Precautions, NPO status , Patient's Chart, lab work & pertinent test results  History of Anesthesia Complications Negative for: history of anesthetic complications  Airway Mallampati: IV  TM Distance: <3 FB Neck ROM: Full    Dental  (+) Teeth Intact   Pulmonary neg pulmonary ROS,    breath sounds clear to auscultation       Cardiovascular hypertension, Pt. on medications and Pt. on home beta blockers  Rhythm:Regular     Neuro/Psych PSYCHIATRIC DISORDERS Anxiety negative neurological ROS     GI/Hepatic negative GI ROS,   Endo/Other  diabetes, Type 2  Renal/GU Renal disease     Musculoskeletal   Abdominal   Peds  Hematology   Anesthesia Other Findings   Reproductive/Obstetrics                             Anesthesia Physical Anesthesia Plan  ASA: III  Anesthesia Plan: General   Post-op Pain Management:    Induction: Intravenous  Airway Management Planned: LMA  Additional Equipment: None  Intra-op Plan:   Post-operative Plan: Extubation in OR  Informed Consent: I have reviewed the patients History and Physical, chart, labs and discussed the procedure including the risks, benefits and alternatives for the proposed anesthesia with the patient or authorized representative who has indicated his/her understanding and acceptance.   Dental advisory given  Plan Discussed with: CRNA and Surgeon  Anesthesia Plan Comments:         Anesthesia Quick Evaluation

## 2017-05-08 NOTE — H&P (Signed)
HPI: Jerry York is a 66 year-old male patient who was referred by Dr. Bernadene Person, MD who is here today as a new patient seen for evaluation of BPH and lower tract symptoms.  His symptoms began approximately 10/26/2016. The patient complains of lower urinary tract symptom(s) that include hesitancy, weak stream, intermittency, and straining. The patient states his most bothersome symptom(s) are the following: hesitancy.   He does have frequency. He does not have urgency. He has nocturia 1 time per night. He does have to wait a long time to start his urinary stream. His urinary stream does start and stop during voiding.   He has previously tried none for his symptoms. Patient is currently treated with none for his symptoms. Patient states he has not had urinary retention in the past.   04/27/17: He said he has always had a weak stream but about 6 months ago he has noted progressive worsening of his voiding with increased hesitancy to the point where he said he has to bend over in order to start his urinary stream. He said. He is weak and intermittent. He has nocturia only one time. He has not had any hematuria. He said he has had one UTI back in 12/17. He also reports he has incontinence that occurs throughout the day.     CC: I have hydronephrosis.  HPI: His problem began 03/28/2017. The cyst is on both sides. He had the following imaging studies done: Renal Ultrasound.   He underwent a renal ultrasound on 03/28/17 to evaluate for a creatinine that increased from 0.71 up to 2.1 over a 1 year period. This revealed bilateral hydronephrosis with a distended bladder.  05/01/17: Patient had Foley catheter inserted last week with over 3 L drained from the bladder. He had worsening renal function and bilateral hydronephrosis from obstructive uropathy. He was seen for the first time last week. Since catheter was placed, the patient's been having some bladder spasms and lower back pain. He also been  intermittently having gross hematuria. Blood pressure is a little low today and subjectively he states feeling dizzy. Patient remains on tamsulosin. He denies fevers. He has been constipated.      ALLERGIES: sulfa    MEDICATIONS: Aspirin 325 mg tablet  Hydrochlorothiazide  Lisinopril  Metformin Hcl  Metoprolol Tartrate  Amlodipine Besylate  Atorvastatin Calcium  Cinnamon  Clonazepam  Diabetes Health Formula  Fish Oil  Fluticasone Propionate  Furosemide  Glimepiride  Jardiance  Klor-Con  Lomotil  Novotwist     GU PSH: None   NON-GU PSH: Appendectomy (open) Hernia Repair    GU PMH: None   NON-GU PMH: Arthritis Cardiac murmur, unspecified Diabetes Type 2 Hypercholesterolemia Hypertension    FAMILY HISTORY: 1 Daughter - Runs in Family 1 son - Runs in Family nephrolithiasis - Father   SOCIAL HISTORY: Marital Status: Married Current Smoking Status: Patient has never smoked.   Tobacco Use Assessment Completed: Used Tobacco in last 30 days? Drinks 1 caffeinated drink per day.    REVIEW OF SYSTEMS:    GU Review Male:   Patient denies frequent urination, hard to postpone urination, burning/ pain with urination, get up at night to urinate, leakage of urine, stream starts and stops, trouble starting your stream, have to strain to urinate , erection problems, and penile pain.  Gastrointestinal (Upper):   Patient denies nausea, vomiting, and indigestion/ heartburn.  Gastrointestinal (Lower):   Patient denies diarrhea and constipation.  Constitutional:   Patient denies fever, night sweats, weight  loss, and fatigue.  Skin:   Patient denies skin rash/ lesion and itching.  Eyes:   Patient denies blurred vision and double vision.  Ears/ Nose/ Throat:   Patient denies sore throat and sinus problems.  Hematologic/Lymphatic:   Patient denies swollen glands and easy bruising.  Cardiovascular:   Patient denies leg swelling and chest pains.  Respiratory:   Patient denies cough  and shortness of breath.  Endocrine:   Patient denies excessive thirst.  Musculoskeletal:   Patient denies back pain and joint pain.  Neurological:   Patient denies headaches and dizziness.  Psychologic:   Patient denies depression and anxiety.   VITAL SIGNS:    Weight 176 lb / 79.83 kg  Height 66 in / 167.64 cm  BP 112/71 mmHg  Pulse 69 /min  BMI 28.4 kg/m   GU PHYSICAL EXAMINATION:    Anus and Perineum: No hemorrhoids. No anal stenosis. No rectal fissure, no anal fissure. No edema, no dimple, no perineal tenderness, no anal tenderness.  Scrotum: No lesions. No edema. No cysts. No warts.  Epididymides: Right: no spermatocele, no masses, no cysts, no tenderness, no induration, no enlargement. Left: no spermatocele, no masses, no cysts, no tenderness, no induration, no enlargement.  Testes: No tenderness, no swelling, no enlargement left testes. No tenderness, no swelling, no enlargement right testes. Normal location left testes. Normal location right testes. No mass, no cyst, no varicocele, no hydrocele left testes. No mass, no cyst, no varicocele, no hydrocele right testes.  Urethral Meatus: Normal size. No lesion, no wart, no discharge, no polyp. Normal location.  Penis: Circumcised, no warts, no cracks. No dorsal Peyronie's plaques, no left corporal Peyronie's plaques, no right corporal Peyronie's plaques, no scarring, no warts. No balanitis, no meatal stenosis.  Prostate: 40 gram or 2+ size. Left lobe normal consistency, right lobe normal consistency. Symmetrical lobes. No prostate nodule. Left lobe no tenderness, right lobe no tenderness.  Seminal Vesicles: Nonpalpable.  Sphincter Tone: Normal sphincter. No rectal tenderness. No rectal mass.    MULTI-SYSTEM PHYSICAL EXAMINATION:    Constitutional: Well-nourished. No physical deformities. Normally developed. Good grooming.  Neck: Neck symmetrical, not swollen. Normal tracheal position.  Respiratory: No labored breathing, no use of  accessory muscles.   Cardiovascular: Normal temperature, normal extremity pulses, no swelling, no varicosities.  Lymphatic: No enlargement of neck, axillae, groin.  Skin: No paleness, no jaundice, no cyanosis. No lesion, no ulcer, no rash.  Neurologic / Psychiatric: Oriented to time, oriented to place, oriented to person. No depression, no anxiety, no agitation.  Gastrointestinal: No mass, no tenderness, no rigidity, non obese abdomen.  Eyes: Normal conjunctivae. Normal eyelids.  Ears, Nose, Mouth, and Throat: Left ear no scars, no lesions, no masses. Right ear no scars, no lesions, no masses. Nose no scars, no lesions, no masses. Normal hearing. Normal lips.  Musculoskeletal: Normal gait and station of head and neck.     PAST DATA REVIEWED:  Source Of History:  Patient  Lab Test Review:   BUN/Creatinine, Potassium  Records Review:   Previous Doctor Records, POC Tool  Urine Test Review:   Urinalysis, Urine Culture  X-Ray Review: Renal Ultrasound: Reviewed Films. Discussed With Patient.    Notes:                     His urinalysis on 03/21/17 had 3-5 wbc's/hpf but was otherwise unremarkable. A urine culture that time was negative.  His serum potassium was noted to be 4.0.   PROCEDURES:  PVR Ultrasound - 16109  Notes: He is in urinary retention.  Scanned Volume: 999 cc         Catheter / SP Tube - 51702 Simple Foley Catheterization  A 16 French Foley catheter was inserted into the bladder using sterile technique. The patient was taught routine catheter care. A leg bag was connected. 3350 cc of urine was obtained.   C.T. Urogram - [id number]      The patient has persistent left-sided hydroureteronephrosis down to the level of the bladder which is decompressed. There are no stones along the ureter or any other clear signs are etiology of obstruction. The patient's right-sided hydronephrosis has improved quite significantly. He also has a complex cyst in the left kidney as well as  numerous simple cyst bilaterally.         Renal Ultrasound - 60454  Right Kidney: Length: 12.7 cm Depth: 6.9 cm Cortical Width:1.5 cm Width: 5.9 cm  Left Kidney: Length: 14.5 cm Depth: 6.4 cm Cortical Width: 1.8 cm Width: 5.8 cm  Left Kidney/Ureter:  Mild-moderate hydronephrosis with dilated prox ureter (1.3cm). Numerous cystic areas, largest = 1) 4.8x3.5x4.6cm. LP 2) 4.9x5.1x5.4cm LP - complex appearance 3) 3.2x2.6x2.9cm Mid  Right Kidney/Ureter:  Mild hydronephrosis and dilated prox ureter (.40cm). Numerous cystic areas, largest = 1) 2.4x1.9x2.1cm LP - ? septated 2) 2.2x2.0x2.3cm UP 3) 3.8x4.1x4.3cm UP  Bladder:  Catheter Noted. ? thickened wall vs clot      Incidental - liver cyst noted as seen previously. Patient reports RT side pain - nurse informed as well as pt's RX question.           Urinalysis w/Scope - 81001 Dipstick Dipstick Cont'd Micro  Specimen: Voided Bilirubin: Neg WBC/hpf: 0 - 5/hpf  Color: Yellow Ketones: Neg RBC/hpf: NS (Not Seen)  Appearance: Clear Blood: Neg Bacteria: NS (Not Seen)  Specific Gravity: 1.015 Protein: Neg Cystals: NS (Not Seen)  pH: 5.5 Urobilinogen: 0.2 Casts: NS (Not Seen)  Glucose: 2+ Nitrites: Neg Trichomonas: Not Present    Leukocyte Esterase: Trace Mucous: Not Present      Epithelial Cells: 0 - 5/hpf      Yeast: NS (Not Seen)      Sperm: Not Present    ASSESSMENT:      ICD-10 Details  1 GU:   BPH w/LUTS - N40.1 It appears his BPH has resulted in obstructive uropathy. I did not note any worrisome findings on rectal exam today. A PSA will certainly not be obtained in a patient in urinary retention but does need to be rechecked when he returns.  2   Oth hydronephrosis - N13.39 He has developed bilateral hydronephrosis and an elevated creatinine secondary to outlet obstruction.  3   Urinary Obstruction - N13.8 He has obstructive uropathy with bilateral hydronephrosis, elevated residual and elevation in his creatinine. This needs to be  managed with a Foley catheter.  4   Urinary Retention - R33.8 The duration that he has been in retention is unknown. I told him that with the catheter indwelling this should allow resolution of his hydronephrosis and improvement in his renal function. He had 3350 cc in his bladder today. I will monitor this and told him that depending on how long his bladder has been overdistended it may take months before he is able to void spontaneously if that occurs again. He may eventually also require urodynamics in the future.  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  6   Renal cyst - N28.1 Bilateral, We did discuss the fact that he had incidentally noted bilateral renal cysts of no clinical significance noted on his ultrasound as well.          Notes:   A Foley catheter was inserted today and I will then have him return in 4 weeks for reassessment with an ultrasound and a repeat BUN and creatinine as well as a PSA at that time. I have increased his tamsulosin dosage in anticipation of a voiding trial when he returns however I think the probability of spontaneous voiding is going to be low initially. We did briefly discuss self-catheterization as an option and this is something he would like to consider if it is necessary.    PLAN:   I briefly discussed the findings of the patient's ultrasound and subsequent CT scan with him. He does have persistent left-sided hydroureteronephrosis with no clear etiology of obstruction. Perhaps, his thick bladder wall is contributing to his obstruction. However, his renal function did worsen. Given that he has hydronephrosis and worsening renal function I recommended that he proceed to the operating room for cystoscopy, bilateral retrograde pyelograms, possible left diagnostic ureteroscopy and left ureteral stent placement. We discussed this surgery in detail, I answered all his questions about this

## 2017-05-08 NOTE — Anesthesia Procedure Notes (Signed)
Procedure Name: LMA Insertion Date/Time: 05/08/2017 8:09 AM Performed by: Val Eagle Pre-anesthesia Checklist: Patient identified, Emergency Drugs available, Suction available and Patient being monitored Patient Re-evaluated:Patient Re-evaluated prior to inductionOxygen Delivery Method: Circle system utilized Preoxygenation: Pre-oxygenation with 100% oxygen Intubation Type: IV induction Ventilation: Mask ventilation without difficulty LMA: LMA inserted LMA Size: 4.0 Number of attempts: 1 Airway Equipment and Method: Bite block Placement Confirmation: positive ETCO2 Tube secured with: Tape Dental Injury: Teeth and Oropharynx as per pre-operative assessment

## 2017-05-08 NOTE — Transfer of Care (Signed)
  Last Vitals:  Vitals:   05/08/17 0617  BP: (!) 146/74  Pulse: 83  Resp: 16  Temp: 36.4 C    Last Pain:  Vitals:   05/08/17 0617  TempSrc: Oral      Patients Stated Pain Goal: 2 (05/08/17 0719)  Immediate Anesthesia Transfer of Care Note  Patient: Jerry York  Procedure(s) Performed: Procedure(s) (LRB): CYSTOSCOPY/ BILATERAL RETROGRADE (Bilateral) URETEROSCOPY AND STENT PLACEMENT (Left)  Patient Location: PACU  Anesthesia Type: General  Level of Consciousness: awake, alert  and oriented  Airway & Oxygen Therapy: Patient Spontanous Breathing and Patient connected to nasal cannula oxygen  Post-op Assessment: Report given to PACU RN and Post -op Vital signs reviewed and stable  Post vital signs: Reviewed and stable  Complications: No apparent anesthesia complications

## 2017-05-08 NOTE — Discharge Instructions (Signed)
°Post Anesthesia Home Care Instructions ° °Activity: °Get plenty of rest for the remainder of the day. A responsible individual must stay with you for 24 hours following the procedure.  °For the next 24 hours, DO NOT: °-Drive a car °-Operate machinery °-Drink alcoholic beverages °-Take any medication unless instructed by your physician °-Make any legal decisions or sign important papers. ° °Meals: °Start with liquid foods such as gelatin or soup. Progress to regular foods as tolerated. Avoid greasy, spicy, heavy foods. If nausea and/or vomiting occur, drink only clear liquids until the nausea and/or vomiting subsides. Call your physician if vomiting continues. ° °Special Instructions/Symptoms: °Your throat may feel dry or sore from the anesthesia or the breathing tube placed in your throat during surgery. If this causes discomfort, gargle with warm salt water. The discomfort should disappear within 24 hours. ° °If you had a scopolamine patch placed behind your ear for the management of post- operative nausea and/or vomiting: ° °1. The medication in the patch is effective for 72 hours, after which it should be removed.  Wrap patch in a tissue and discard in the trash. Wash hands thoroughly with soap and water. °2. You may remove the patch earlier than 72 hours if you experience unpleasant side effects which may include dry mouth, dizziness or visual disturbances. °3. Avoid touching the patch. Wash your hands with soap and water after contact with the patch. °  ° ° ° ° °Post stent placement instructions ° ° °Definitions: ° °Ureter: The duct that transports urine from the kidney to the bladder. °Stent: A plastic hollow tube that is placed into the ureter, from the kidney to the bladder to prevent the ureter from swelling shut. ° °General instructions: ° °Despite the fact that no skin incisions were used, the area around the ureter and bladder is raw and irritated. The stent is a foreign body which can further irritate  the bladder wall. This irritation is manifested by increased frequency of urination, both day and night, and by an increase in the urge to urinate. In some, the urge to urinate is present almost always. Sometimes the urge is strong enough that you may not be able to stop your self from urinating. This can often be controlled with medication but does not occur in everyone. A stent can safely be left in place for 3 months or greater. ° °You may see some blood in your urine while the stent is in place and a few days afterward. Do not be alarmed, even if the urine is clear for a while. Get off your feet and drink lots of fluids until clearing occurs. If you start to pass clots or don't improve, call us. ° °Diet: ° °You may return to your normal diet immediately. Because of the raw surface of your bladder, alcohol, spicy foods, foods high in acid and drinks with caffeine may cause irritation or frequency and should be used in moderation. To keep your urine flowing freely and avoid constipation, drink plenty of fluids during the day (8-10 glasses). Tip: Avoid cranberry juice because it is very acidic. ° °Activity: ° °Your physical activity doesn't need to be restricted. However, if you are very active, you may see some blood in the urine. We suggest that you reduce your activity under the circumstances until the bleeding has stopped. ° °Bowels: ° °It is important to keep your bowels regular during the postoperative period. Straining with bowel movements can cause bleeding. A bowel movement every other day is reasonable.   Use a mild laxative if needed, such as milk of magnesia 2-3 tablespoons, or 2 Dulcolax tablets. Call if you continue to have problems. If you had been taking narcotics for pain, before, during or after your surgery, you may be constipated. Take a laxative if necessary. ° °Medication: ° °You should resume your pre-surgery medications unless told not to. In addition you may be given an antibiotic to prevent  or treat infection. Antibiotics are not always necessary. All medication should be taken as prescribed until the bottles are finished unless you are having an unusual reaction to one of the drugs. ° °Problems you should report to us: ° °a. Fever greater than 101°F. °b. Heavy bleeding, or clots (see notes above about blood in urine). °c. Inability to urinate. °d. Drug reactions (hives, rash, nausea, vomiting, diarrhea). °e. Severe burning or pain with urination that is not improving. ° ° °

## 2017-05-09 ENCOUNTER — Encounter (HOSPITAL_BASED_OUTPATIENT_CLINIC_OR_DEPARTMENT_OTHER): Payer: Self-pay | Admitting: Urology

## 2017-05-09 LAB — POCT I-STAT, CHEM 8
BUN: 48 mg/dL — ABNORMAL HIGH (ref 6–20)
Calcium, Ion: 1.4 mmol/L (ref 1.15–1.40)
Chloride: 117 mmol/L — ABNORMAL HIGH (ref 101–111)
Creatinine, Ser: 1.3 mg/dL — ABNORMAL HIGH (ref 0.61–1.24)
Glucose, Bld: 96 mg/dL (ref 65–99)
HCT: 33 % — ABNORMAL LOW (ref 39.0–52.0)
Hemoglobin: 11.2 g/dL — ABNORMAL LOW (ref 13.0–17.0)
Potassium: 4.5 mmol/L (ref 3.5–5.1)
Sodium: 146 mmol/L — ABNORMAL HIGH (ref 135–145)
TCO2: 19 mmol/L (ref 0–100)

## 2017-05-23 DIAGNOSIS — H52223 Regular astigmatism, bilateral: Secondary | ICD-10-CM | POA: Diagnosis not present

## 2017-05-23 LAB — HM DIABETES EYE EXAM

## 2017-05-25 DIAGNOSIS — R338 Other retention of urine: Secondary | ICD-10-CM | POA: Diagnosis not present

## 2017-05-25 DIAGNOSIS — N1330 Unspecified hydronephrosis: Secondary | ICD-10-CM | POA: Diagnosis not present

## 2017-05-25 DIAGNOSIS — R972 Elevated prostate specific antigen [PSA]: Secondary | ICD-10-CM | POA: Diagnosis not present

## 2017-05-29 NOTE — Addendum Note (Signed)
Addendum  created 05/29/17 1511 by Kymberley Raz, MD   Sign clinical note    

## 2017-06-08 DIAGNOSIS — R339 Retention of urine, unspecified: Secondary | ICD-10-CM | POA: Diagnosis not present

## 2017-07-10 ENCOUNTER — Telehealth: Payer: Self-pay | Admitting: Endocrinology

## 2017-07-10 NOTE — Telephone Encounter (Signed)
Ok with me, but must be OK with dr Juleen China as well, thanks

## 2017-07-10 NOTE — Telephone Encounter (Signed)
Pt called and would like to know if you would take him on as a new pt?

## 2017-07-11 DIAGNOSIS — I10 Essential (primary) hypertension: Secondary | ICD-10-CM | POA: Diagnosis not present

## 2017-07-11 DIAGNOSIS — N183 Chronic kidney disease, stage 3 (moderate): Secondary | ICD-10-CM | POA: Diagnosis not present

## 2017-07-11 DIAGNOSIS — E1122 Type 2 diabetes mellitus with diabetic chronic kidney disease: Secondary | ICD-10-CM | POA: Diagnosis not present

## 2017-07-11 DIAGNOSIS — E875 Hyperkalemia: Secondary | ICD-10-CM | POA: Diagnosis not present

## 2017-07-11 DIAGNOSIS — R339 Retention of urine, unspecified: Secondary | ICD-10-CM | POA: Diagnosis not present

## 2017-07-11 LAB — CBC AND DIFFERENTIAL: WBC: 5.6

## 2017-07-11 LAB — BASIC METABOLIC PANEL
Creatinine: 2.3 — AB (ref 0.6–1.3)
Glucose: 262

## 2017-07-25 ENCOUNTER — Other Ambulatory Visit: Payer: Self-pay | Admitting: *Deleted

## 2017-07-25 NOTE — Patient Outreach (Signed)
[facility] Screening call, unsuccessful but left a message for a return call. If I do not hear back from the member I will try again within the week.  Ashad Fawbush C. Antinette Keough, MSN, GNP-BC Gerontological Nurse Practitioner [facility] [phone]  

## 2017-08-07 ENCOUNTER — Other Ambulatory Visit: Payer: Self-pay | Admitting: *Deleted

## 2017-08-07 NOTE — Patient Outreach (Signed)
[facility] Screening call #2, unsuccessful but left a message for a return call. If I do not hear back from the member I will try again within the week.  Nicholle Falzon C. Yomaris Palecek, MSN, GNP-BC Gerontological Nurse Practitioner [facility] [phone]   

## 2017-08-08 ENCOUNTER — Emergency Department (HOSPITAL_COMMUNITY): Payer: PPO

## 2017-08-08 ENCOUNTER — Inpatient Hospital Stay (HOSPITAL_COMMUNITY)
Admission: EM | Admit: 2017-08-08 | Discharge: 2017-08-10 | DRG: 872 | Disposition: A | Discharge status: Home / Self Care | Payer: PPO | Attending: Internal Medicine | Admitting: Internal Medicine

## 2017-08-08 ENCOUNTER — Encounter (HOSPITAL_COMMUNITY): Payer: Self-pay | Admitting: Emergency Medicine

## 2017-08-08 DIAGNOSIS — Z8249 Family history of ischemic heart disease and other diseases of the circulatory system: Secondary | ICD-10-CM | POA: Diagnosis not present

## 2017-08-08 DIAGNOSIS — N136 Pyonephrosis: Secondary | ICD-10-CM | POA: Diagnosis present

## 2017-08-08 DIAGNOSIS — Z7982 Long term (current) use of aspirin: Secondary | ICD-10-CM

## 2017-08-08 DIAGNOSIS — A419 Sepsis, unspecified organism: Secondary | ICD-10-CM | POA: Diagnosis present

## 2017-08-08 DIAGNOSIS — E785 Hyperlipidemia, unspecified: Secondary | ICD-10-CM | POA: Diagnosis not present

## 2017-08-08 DIAGNOSIS — Z79899 Other long term (current) drug therapy: Secondary | ICD-10-CM | POA: Diagnosis not present

## 2017-08-08 DIAGNOSIS — R0602 Shortness of breath: Secondary | ICD-10-CM

## 2017-08-08 DIAGNOSIS — K8020 Calculus of gallbladder without cholecystitis without obstruction: Secondary | ICD-10-CM | POA: Diagnosis not present

## 2017-08-08 DIAGNOSIS — E1169 Type 2 diabetes mellitus with other specified complication: Secondary | ICD-10-CM | POA: Diagnosis present

## 2017-08-08 DIAGNOSIS — N400 Enlarged prostate without lower urinary tract symptoms: Secondary | ICD-10-CM | POA: Diagnosis present

## 2017-08-08 DIAGNOSIS — I10 Essential (primary) hypertension: Secondary | ICD-10-CM | POA: Diagnosis present

## 2017-08-08 DIAGNOSIS — E1159 Type 2 diabetes mellitus with other circulatory complications: Secondary | ICD-10-CM | POA: Diagnosis present

## 2017-08-08 DIAGNOSIS — N179 Acute kidney failure, unspecified: Secondary | ICD-10-CM | POA: Diagnosis present

## 2017-08-08 DIAGNOSIS — R938 Abnormal findings on diagnostic imaging of other specified body structures: Secondary | ICD-10-CM | POA: Diagnosis not present

## 2017-08-08 DIAGNOSIS — E119 Type 2 diabetes mellitus without complications: Secondary | ICD-10-CM

## 2017-08-08 DIAGNOSIS — I129 Hypertensive chronic kidney disease with stage 1 through stage 4 chronic kidney disease, or unspecified chronic kidney disease: Secondary | ICD-10-CM | POA: Diagnosis present

## 2017-08-08 DIAGNOSIS — E1122 Type 2 diabetes mellitus with diabetic chronic kidney disease: Secondary | ICD-10-CM | POA: Diagnosis not present

## 2017-08-08 DIAGNOSIS — Z7984 Long term (current) use of oral hypoglycemic drugs: Secondary | ICD-10-CM

## 2017-08-08 DIAGNOSIS — N390 Urinary tract infection, site not specified: Secondary | ICD-10-CM | POA: Diagnosis not present

## 2017-08-08 DIAGNOSIS — R9389 Abnormal findings on diagnostic imaging of other specified body structures: Secondary | ICD-10-CM

## 2017-08-08 DIAGNOSIS — E11649 Type 2 diabetes mellitus with hypoglycemia without coma: Secondary | ICD-10-CM | POA: Diagnosis not present

## 2017-08-08 DIAGNOSIS — R079 Chest pain, unspecified: Secondary | ICD-10-CM | POA: Diagnosis present

## 2017-08-08 DIAGNOSIS — N183 Chronic kidney disease, stage 3 (moderate): Secondary | ICD-10-CM | POA: Diagnosis not present

## 2017-08-08 DIAGNOSIS — I152 Hypertension secondary to endocrine disorders: Secondary | ICD-10-CM | POA: Diagnosis present

## 2017-08-08 DIAGNOSIS — F419 Anxiety disorder, unspecified: Secondary | ICD-10-CM | POA: Diagnosis not present

## 2017-08-08 DIAGNOSIS — R072 Precordial pain: Secondary | ICD-10-CM | POA: Diagnosis not present

## 2017-08-08 DIAGNOSIS — N1330 Unspecified hydronephrosis: Secondary | ICD-10-CM | POA: Diagnosis present

## 2017-08-08 DIAGNOSIS — K7689 Other specified diseases of liver: Secondary | ICD-10-CM | POA: Diagnosis not present

## 2017-08-08 LAB — URINALYSIS, ROUTINE W REFLEX MICROSCOPIC
Bilirubin Urine: NEGATIVE
GLUCOSE, UA: 50 mg/dL — AB
Ketones, ur: 5 mg/dL — AB
NITRITE: NEGATIVE
PH: 5 (ref 5.0–8.0)
PROTEIN: 100 mg/dL — AB
SPECIFIC GRAVITY, URINE: 1.015 (ref 1.005–1.030)

## 2017-08-08 LAB — COMPREHENSIVE METABOLIC PANEL
ALBUMIN: 3.4 g/dL — AB (ref 3.5–5.0)
ALT: 19 U/L (ref 17–63)
AST: 16 U/L (ref 15–41)
Alkaline Phosphatase: 52 U/L (ref 38–126)
Anion gap: 14 (ref 5–15)
BILIRUBIN TOTAL: 0.7 mg/dL (ref 0.3–1.2)
BUN: 76 mg/dL — ABNORMAL HIGH (ref 6–20)
CALCIUM: 9.6 mg/dL (ref 8.9–10.3)
CO2: 18 mmol/L — AB (ref 22–32)
CREATININE: 2.84 mg/dL — AB (ref 0.61–1.24)
Chloride: 105 mmol/L (ref 101–111)
GFR calc Af Amer: 25 mL/min — ABNORMAL LOW (ref >60)
GFR calc non Af Amer: 22 mL/min — ABNORMAL LOW (ref >60)
GLUCOSE: 204 mg/dL — AB (ref 65–99)
Potassium: 4.6 mmol/L (ref 3.5–5.1)
SODIUM: 137 mmol/L (ref 135–145)
TOTAL PROTEIN: 8.6 g/dL — AB (ref 6.5–8.1)

## 2017-08-08 LAB — BRAIN NATRIURETIC PEPTIDE: B NATRIURETIC PEPTIDE 5: 58.6 pg/mL (ref 0.0–100.0)

## 2017-08-08 LAB — CBC
HCT: 40.3 % (ref 39.0–52.0)
Hemoglobin: 13.8 g/dL (ref 13.0–17.0)
MCH: 31.7 pg (ref 26.0–34.0)
MCHC: 34.2 g/dL (ref 30.0–36.0)
MCV: 92.6 fL (ref 78.0–100.0)
PLATELETS: 143 10*3/uL — AB (ref 150–400)
RBC: 4.35 MIL/uL (ref 4.22–5.81)
RDW: 16.5 % — AB (ref 11.5–15.5)
WBC: 15.7 10*3/uL — ABNORMAL HIGH (ref 4.0–10.5)

## 2017-08-08 LAB — I-STAT TROPONIN, ED: Troponin i, poc: 0.04 ng/mL (ref 0.00–0.08)

## 2017-08-08 LAB — I-STAT CG4 LACTIC ACID, ED: LACTIC ACID, VENOUS: 1.32 mmol/L (ref 0.5–1.9)

## 2017-08-08 LAB — LIPASE, BLOOD: Lipase: 36 U/L (ref 11–51)

## 2017-08-08 MED ORDER — SODIUM CHLORIDE 0.9 % IV BOLUS (SEPSIS)
1000.0000 mL | Freq: Once | INTRAVENOUS | Status: AC
  Administered 2017-08-08: 1000 mL via INTRAVENOUS

## 2017-08-08 MED ORDER — ACETAMINOPHEN 325 MG PO TABS
650.0000 mg | ORAL_TABLET | Freq: Once | ORAL | Status: AC
  Administered 2017-08-08: 650 mg via ORAL
  Filled 2017-08-08: qty 2

## 2017-08-08 MED ORDER — ONDANSETRON HCL 4 MG/2ML IJ SOLN
4.0000 mg | Freq: Once | INTRAMUSCULAR | Status: AC
  Administered 2017-08-08: 4 mg via INTRAVENOUS
  Filled 2017-08-08: qty 2

## 2017-08-08 MED ORDER — SODIUM CHLORIDE 0.9 % IV BOLUS (SEPSIS)
250.0000 mL | Freq: Once | INTRAVENOUS | Status: AC
  Administered 2017-08-09: 250 mL via INTRAVENOUS

## 2017-08-08 MED ORDER — DEXTROSE 5 % IV SOLN
1.0000 g | Freq: Once | INTRAVENOUS | Status: AC
  Administered 2017-08-08: 1 g via INTRAVENOUS
  Filled 2017-08-08: qty 10

## 2017-08-08 MED ORDER — ACETAMINOPHEN 325 MG PO TABS: 650.0000 mg | ORAL_TABLET | Freq: Once | ORAL | Status: DC

## 2017-08-08 NOTE — H&P (Signed)
History and Physical    Jerry York UJW:[id number] DOB: [birth year] DOA: 08/08/2017  PCP: Darci Needle, MD   Patient coming from: Home.  I have personally briefly reviewed patient's old medical records in [facility]  Chief Complaint: Nausea and vomiting.  HPI: Jerry York is a 66 y.o. male with medical history significant of anxiety, bilateral renal cysts, gout, heart murmur, hyperlipidemia, hypertension, chronic renal insufficiency, type 2 diabetes mellitus, bilateral hydronephrosis due to obstructive uropathy secondary to BPH, status post left ureter stent placement in May by Dr. Vernie Ammons due to persistent left hydronephrosis in May 08, 2017 who is coming to the emergency department with complaints of progressively worse nausea since Saturday, low-grade temperatures, night sweats, decreased appetite, fatigue, malaise and multiple episodes of emesis since Monday, including one episode earlier today associated with diffuse abdominal pain. He denies dysuria, since he has been self cathing his bladder since May. He denies diarrhea, constipation, melena or hematochezia. No polyuria, no polydipsia, no blurred vision.  He also complains of central, nonradiating, sharp chest pain without any other symptoms. However, he mentions that he has been progressively more dyspneic over the past few months. He complains of occasional postural dizziness and palpitations. He denies diaphoresis, PND, orthopnea or pitting edema of the lower extremities. He has a RBBB on EKG and a heart murmur, but denies any formal cardiac workup. He is a nonsmoker, but has a history of type 2 diabetes, hypertension, hyperlipidemia and his mother has a history of CHF, but no known CAD.  ED Course: His initial vital signs in the emergency department temperature 97.26F, pulse 133, blood pressure 131/76, respirations 16 and O2 sat 95% on room air. The patient received normal saline bolus in the emergency department, Rocephin  1 g IVPB and Zofran 4 mg IV 1. He feels better, but still mildly nauseous. His urinalysis showed mild glucosuria, moderate hemoglobinuria, significant pyuria and bacteriuria. WBC was 15.7, hemoglobin 13.8 g/dL and platelets 562. Sodium 137, potassium 4.6, chloride 105, CO2 18 and lactic acid 1.32 mmol/L. His BUN was 76, creatinine 2.84, calcium 9.6, glucose 204 and magnesium 2.1 mg/dL. EKG showed significantly increased rate from last tracing, previous RBBB, questionable ST elevation, artifact and Baseline wander. Troponin level was normal and BNP was 58.6 pg/mL.  Imaging: His 2 view chest radiograph did not show acute cardiopulmonary pathology. CT renal stone study showed decrease left hydronephrosis press and left-sided ureteral stent. No urolithiasis or right hydronephrosis. There was diffuse bladder thickening. There was a dilated gallbladder with gallstones. Multiple low-density masses within both kidneys. MRI or CT renal suggested for further evaluation. RUQ ultrasound shows slightly dilated gallbladder containing sludge and stones which was negative for wall thickening or sonographic Murphy's sign. There was no biliary tree dilation. Please see images and for audiology reports for further detail.    Review of Systems: As per HPI otherwise 10 point review of systems negative.    Past Medical History:  Diagnosis Date  . Anxiety   . Bilateral renal cysts   . Foley catheter in place   . Gout    02-22-2017 acute right foot gout---  per pt resolved  . Gross hematuria   . Heart murmur   . Hydronephrosis, left   . Hyperlipidemia   . Hypertension   . Renal insufficiency   . Type 2 diabetes mellitus (HCC)   . Urinary retention   . Wears glasses     Past Surgical History:  Procedure Laterality Date  . APPENDECTOMY  age 41  . CYSTOSCOPY WITH URETEROSCOPY AND STENT PLACEMENT Left 05/08/2017   Procedure: URETEROSCOPY AND STENT PLACEMENT;  Surgeon: Ihor Gully, MD;  Location: [facility];  Service: Urology;  Laterality: Left;  . CYSTOSCOPY/RETROGRADE/URETEROSCOPY Bilateral 05/08/2017   Procedure: CYSTOSCOPY/ BILATERAL RETROGRADE;  Surgeon: Ihor Gully, MD;  Location: [facility];  Service: Urology;  Laterality: Bilateral;  . INGUINAL HERNIA REPAIR Right 10/10/2000  . KNEE ARTHROSCOPY Right 1980's  . SHOULDER ARTHROSCOPY WITH OPEN ROTATOR CUFF REPAIR Right 1990's     reports that he has never smoked. He has never used smokeless tobacco. He reports that he does not drink alcohol or use drugs.  Allergies  Allergen Reactions  . Sulfa Antibiotics Other (See Comments)    "make my feet burn"    Family History  Problem Relation Age of Onset  . Congestive Heart Failure Mother   . Hypertension Brother   . Hypertension Daughter   . Hypertension Son     Prior to Admission medications   Medication Sig Start Date End Date Taking? Authorizing Provider  acetaminophen (TYLENOL) 325 MG tablet Take 650 mg by mouth every 6 (six) hours as needed for moderate pain.   Yes [provider]  amLODipine (NORVASC) 10 MG tablet Take 10 mg by mouth daily.    Yes [provider]  aspirin 325 MG tablet Take 325 mg by mouth 2 (two) times daily.    Yes [provider]  atorvastatin (LIPITOR) 20 MG tablet Take 20 mg by mouth every evening.    Yes [provider]  [name] Take 5,000 mg by mouth 2 (two) times daily.    Yes [provider]  clonazePAM (KLONOPIN) 0.5 MG tablet Take 0.5 mg by mouth 2 (two) times daily as needed for anxiety.   Yes [provider]  Coenzyme Q10 (COQ-10) 100 MG CAPS Take 1 capsule by mouth 2 (two) times daily.   Yes [provider]  empagliflozin (JARDIANCE) 10 MG TABS tablet Take 10 mg by mouth daily.    Yes [provider]  furosemide (LASIX) 40 MG tablet Take 40 mg by mouth daily. 07/17/17  Yes [provider]  glimepiride (AMARYL) 2 MG tablet Take 2 mg by  mouth 2 (two) times daily.   Yes [provider]  hydrochlorothiazide (HYDRODIURIL) 25 MG tablet Take 25 mg by mouth every morning.   Yes [provider]  lisinopril (PRINIVIL,ZESTRIL) 40 MG tablet Take 40 mg by mouth 2 (two) times daily.   Yes [provider]  metFORMIN (GLUMETZA) 1000 MG (MOD) 24 hr tablet Take 1,000 mg by mouth 2 (two) times daily.   Yes [provider]  metoprolol (LOPRESSOR) 100 MG tablet Take 100 mg by mouth 2 (two) times daily.   Yes [provider]  Omega-3 Fatty Acids (FISH OIL) 1200 MG CAPS Take 2,400 mg by mouth 2 (two) times daily.   Yes [provider]  potassium chloride SA (K-DUR,KLOR-CON) 20 MEQ tablet Take 20 mEq by mouth every morning.   Yes [provider]  tamsulosin (FLOMAX) 0.4 MG CAPS capsule Take 0.8 mg by mouth daily after supper.   Yes [provider]  ciprofloxacin (CIPRO) 500 MG tablet Take 1 tablet (500 mg total) by mouth 2 (two) times daily. Patient not taking: Reported on 08/08/2017 05/08/17   Ihor Gully, MD  indomethacin (INDOCIN) 25 MG capsule Take 1 capsule (25 mg total) by mouth 3 (three) times daily as needed. Patient  not taking: Reported on 08/08/2017 02/22/17   Tyrone Nine, MD  tolterodine (DETROL LA) 4 MG 24 hr capsule Take 1 capsule (4 mg total) by mouth daily. Patient not taking: Reported on 08/08/2017 05/08/17   Ihor Gully, MD    Physical Exam: Vitals:   08/08/17 1756 08/08/17 1934 08/08/17 2204  BP: 131/76 120/77 109/61  Pulse: (!) 133 (!) 126 (!) 118  Resp: 16 16 20   Temp: 97.9 F (36.6 C) 98.9 F (37.2 C) 100.2 F (37.9 C)  TempSrc: Oral Oral Oral  SpO2: 95% 93% 94%  Weight: 72.6 kg (160 lb)    Height: 5\' 6"  (1.676 m)      Constitutional: NAD, calm, comfortable Eyes: PERRL, lids and conjunctivae normal ENMT: Mucous membranes and lips are mildly dry. Posterior pharynx clear of any exudate or lesions. Neck: normal, supple, no masses, no  thyromegaly Respiratory: clear to auscultation bilaterally, no wheezing, no crackles. Normal respiratory effort. No accessory muscle use.  Cardiovascular: Tachycardic at 102 BPM, soft systolic murmur, no rubs / gallops. No extremity edema. 2+ pedal pulses. No carotid bruits.  Abdomen: Soft, left lower quadrant tenderness, no guarding/rebound/masses palpated. No hepatosplenomegaly. Bowel sounds positive.  Musculoskeletal: no clubbing / cyanosis. Good ROM, no contractures. Normal muscle tone.  Skin: no significant rashes, lesions, ulcers on limited skin exam. Neurologic: CN 2-12 grossly intact. Sensation intact, DTR normal. Strength 5/5 in all 4.  Psychiatric: Normal judgment and insight. Alert and oriented x 4. Normal mood.     Labs on Admission: I have personally reviewed following labs and imaging studies  CBC:  Recent Labs Lab 08/08/17 1829  WBC 15.7*  HGB 13.8  HCT 40.3  MCV 92.6  PLT 143*   Basic Metabolic Panel:  Recent Labs Lab 08/08/17 1829  NA 137  K 4.6  CL 105  CO2 18*  GLUCOSE 204*  BUN 76*  CREATININE 2.84*  CALCIUM 9.6   GFR: Estimated Creatinine Clearance: 23.4 mL/min (A) (by C-G formula based on SCr of 2.84 mg/dL (H)). Liver Function Tests:  Recent Labs Lab 08/08/17 1829  AST 16  ALT 19  ALKPHOS 52  BILITOT 0.7  PROT 8.6*  ALBUMIN 3.4*    Recent Labs Lab 08/08/17 1829  LIPASE 36   No results for input(s): AMMONIA in the last 168 hours. Coagulation Profile: No results for input(s): INR, PROTIME in the last 168 hours. Cardiac Enzymes: No results for input(s): CKTOTAL, CKMB, CKMBINDEX, TROPONINI in the last 168 hours. BNP (last 3 results) No results for input(s): PROBNP in the last 8760 hours. HbA1C: No results for input(s): HGBA1C in the last 72 hours. CBG: No results for input(s): GLUCAP in the last 168 hours. Lipid Profile: No results for input(s): CHOL, HDL, LDLCALC, TRIG, CHOLHDL, LDLDIRECT in the last 72 hours. Thyroid Function  Tests: No results for input(s): TSH, T4TOTAL, FREET4, T3FREE, THYROIDAB in the last 72 hours. Anemia Panel: No results for input(s): VITAMINB12, FOLATE, FERRITIN, TIBC, IRON, RETICCTPCT in the last 72 hours. Urine analysis:    Component Value Date/Time   COLORURINE YELLOW 08/08/2017 2123   APPEARANCEUR CLOUDY (A) 08/08/2017 2123   LABSPEC 1.015 08/08/2017 2123   PHURINE 5.0 08/08/2017 2123   GLUCOSEU 50 (A) 08/08/2017 2123   HGBUR MODERATE (A) 08/08/2017 2123   BILIRUBINUR NEGATIVE 08/08/2017 2123   BILIRUBINUR negative 12/12/2016 1224   KETONESUR 5 (A) 08/08/2017 2123   PROTEINUR 100 (A) 08/08/2017 2123   UROBILINOGEN 0.2 12/12/2016 1224   NITRITE NEGATIVE 08/08/2017 2123  LEUKOCYTESUR LARGE (A) 08/08/2017 2123    Radiological Exams on Admission: Dg Chest 2 View  Result Date: 08/08/2017 CLINICAL DATA:  Weakness and shortness of breath EXAM: CHEST  2 VIEW COMPARISON:  Report 06/16/2011 FINDINGS: The heart size and mediastinal contours are within normal limits. Both lungs are clear. Calcific tendinitis at the right shoulder IMPRESSION: No active cardiopulmonary disease. Electronically Signed   By: Jasmine Pang M.D.   On: 08/08/2017 19:39   Ct Renal Stone Study  Result Date: 08/08/2017 CLINICAL DATA:  Nausea and emesis EXAM: CT ABDOMEN AND PELVIS WITHOUT CONTRAST TECHNIQUE: Multidetector CT imaging of the abdomen and pelvis was performed following the standard protocol without IV contrast. COMPARISON:  05/05/2017, 03/28/2017 FINDINGS: Lower chest: Lung bases demonstrate no acute consolidation or pleural effusion. Normal heart size. Coronary artery calcification. Hepatobiliary: Multiple low-density lesions consistent with cysts. Largest is seen in the inferior right hepatic lobe and measures 5.9 cm. Dilated gallbladder up to 5 cm. Small calcified stone. No biliary dilatation. Pancreas: Unremarkable. No pancreatic ductal dilatation or surrounding inflammatory changes. Spleen: Normal in  size without focal abnormality.  Splenic granuloma Adrenals/Urinary Tract: Adrenal glands are within normal limits. Negative for right hydronephrosis. Multiple hypodense masses within the bilateral kidneys, some of which are consistent with cysts, others are indeterminate. Interim placement of left-sided ureteral stent with decreased hydronephrosis since the prior study. Marked wall thickening of the urinary bladder. Stomach/Bowel: Stomach within normal limits. No dilated small bowel. No colon wall thickening. Status post appendectomy Vascular/Lymphatic: Aortic atherosclerosis. No enlarged abdominal or pelvic lymph nodes. Reproductive: Enlarged prostate with calcification Other: Negative for free air or free fluid Musculoskeletal: Degenerative changes. Chronic appearing superior compression at T12. IMPRESSION: 1. Interval placement of left-sided ureteral stent with decreased hydronephrosis since the prior study. Negative for right hydronephrosis, kidney stone, or ureteral stone. There remains significant diffuse bladder wall thickening which may be secondary to cystitis, chronic obstruction, or neoplasm 2. Dilated gallbladder containing small calcified stone. No surrounding wall thickening or fluid, correlation with ultrasound as indicated 3. Multiple low-density masses within both kidneys, some are complex and in completely evaluated without contrast material. Further evaluation with MRI or renal CT could be obtained when clinically feasible 4. Hepatic cysts Electronically Signed   By: Jasmine Pang M.D.   On: 08/08/2017 23:13    EKG: Independently reviewed Vent. rate 124 BPM PR interval * ms QRS duration 136 ms QT/QTc 313/450 ms P-R-T axes 39 -86 -21 Sinus tachycardia Right bundle branch block ST elevation, consider lateral injury Baseline wander in lead(s) II III aVF  Assessment/Plan Principal Problem:   Sepsis secondary to UTI (HCC) Admit to telemetry/inpatient. Continue IV  fluids. Acetaminophen as needed for fever. Continue Zofran as needed for nausea Continue ceftriaxone 1 g IVPB every 24 hours. Follow-up blood cultures and sensitivity. Follow-up urine culture and sensitivity.  Active Problems:   AKI (acute kidney injury) (HCC) The patient has not been taking lisinopril for a few months. He has not use furosemide and hydrochlorothiazide the past few days. Will continue IV fluids and continue to hold diuretics. Follow-up renal function and electrolytes in a.m. Consider further evaluation if no improvement.    Atypical chest pain Nonexertional. Sharp burning like, emesis? EKG shows artifact and lead wander, but does not have  any significant changes when compared to previous. Repeat troponin level with morning labs. Repeat EKG in the morning. Check echocardiogram.    Hydronephrosis, left Improved on CT scan when compared to previous. Per patient and wife,  Follow-up with urology is in September.    Hyperlipidemia Continue atorvastatin 20 mg daily. Monitor LFTs periodically. Fasting lipid profile as an outpatient.    Hypertension Continue amlodipine 10 mg by mouth daily. Continue metoprolol titrate 100 mg by mouth twice a day. Monitor blood pressure.    Type 2 diabetes mellitus (HCC) Carbohydrate modified diet. Hold metformin due to elevated creatinine. Continue Amaryl 2 mg by mouth twice a day. Continue Jardiance 10 mg by mouth daily. CBG monitoring with regular insulin sliding scale while in the hospital.    Anxiety Continue clonazepam 0.5 mg by mouth twice a day when necessary.     DVT prophylaxis: Lovenox SQ. Code Status: Full code. Family Communication: His wife was present in the emergency department room. Disposition Plan: Need for IV antibiotics for 2-3 days. Consults called:  Admission status: Inpatient/telemetry.   Bobette Mo MD [facility] Pager [phone].  If 7PM-7AM, please contact  night-coverage [email or web address] Password [facility]  08/08/2017, 11:59 PM

## 2017-08-08 NOTE — ED Triage Notes (Signed)
Pt from home with complaints of nausea and emesis that began about 3 days ago. Pt states he has had 1 episode of emesis today. Pt denies diarrhea. Pt states he feels fatigued and has intermittent CP. Pt denies symptoms currently. Pt has hx of renal disease. Pt reports intermittent SOB. Pt is able to maintain his O2 Sat above 90% and speak in full sentences with no difficulty. Pt is tachycardic at time of assessment, but is afebrile and is does not exhibit tachypnea.

## 2017-08-08 NOTE — ED Provider Notes (Signed)
[facility]-EMERGENCY DEPT Provider Note   CSN: [id number] Arrival date & time: 08/08/17  1731     History   Chief Complaint Chief Complaint  Patient presents with  . multiple complaints    HPI Jerry York is a 66 y.o. male.  The history is provided by the patient and medical records. No language interpreter was used.   Jerry York is a 66 y.o. male  with a PMH of DM2, HTN, HLD, kidney disease (patient unable to specify), left hydronephrosis who presents to the Emergency Department complaining of nausea, vomiting and generalized abdominal pain x 3 days. One episode of emesis today. No diarrhea, constipation or blood in the stool. No sick contacts or recent travel. Patient also endorses progressively worsening intermittent shortness of breath. He has been experiencing shortness of breath for about 6 months but this has been worsening over the last week. He also endorses intermittent sharp central chest pain - denies exertional component. No known history of CAD or heart failure. No recent surgeries or immobilizations. No prior hx of DVT / PE. No leg swelling or pain. No hormone use. Not on anticoagulants.   Past Medical History:  Diagnosis Date  . Anxiety   . Bilateral renal cysts   . Foley catheter in place   . Gout    02-22-2017 acute right foot gout---  per pt resolved  . Gross hematuria   . Heart murmur   . Hydronephrosis, left   . Hyperlipidemia   . Hypertension   . Renal insufficiency   . Type 2 diabetes mellitus (HCC)   . Urinary retention   . Wears glasses     There are no active problems to display for this patient.   Past Surgical History:  Procedure Laterality Date  . APPENDECTOMY  age 71  . CYSTOSCOPY WITH URETEROSCOPY AND STENT PLACEMENT Left 05/08/2017   Procedure: URETEROSCOPY AND STENT PLACEMENT;  Surgeon: Ihor Gully, MD;  Location: [facility];  Service: Urology;  Laterality: Left;  . CYSTOSCOPY/RETROGRADE/URETEROSCOPY Bilateral  05/08/2017   Procedure: CYSTOSCOPY/ BILATERAL RETROGRADE;  Surgeon: Ihor Gully, MD;  Location: [facility];  Service: Urology;  Laterality: Bilateral;  . INGUINAL HERNIA REPAIR Right 10/10/2000  . KNEE ARTHROSCOPY Right 1980's  . SHOULDER ARTHROSCOPY WITH OPEN ROTATOR CUFF REPAIR Right 1990's       Home Medications    Prior to Admission medications   Medication Sig Start Date End Date Taking? Authorizing Provider  acetaminophen (TYLENOL) 325 MG tablet Take 650 mg by mouth every 6 (six) hours as needed for moderate pain.   Yes [provider]  amLODipine (NORVASC) 10 MG tablet Take 10 mg by mouth daily.    Yes [provider]  aspirin 325 MG tablet Take 325 mg by mouth 2 (two) times daily.    Yes [provider]  atorvastatin (LIPITOR) 20 MG tablet Take 20 mg by mouth every evening.    Yes [provider]  [name] Take 5,000 mg by mouth 2 (two) times daily.    Yes [provider]  clonazePAM (KLONOPIN) 0.5 MG tablet Take 0.5 mg by mouth 2 (two) times daily as needed for anxiety.   Yes [provider]  Coenzyme Q10 (COQ-10) 100 MG CAPS Take 1 capsule by mouth 2 (two) times daily.   Yes [provider]  empagliflozin (JARDIANCE) 10 MG TABS tablet Take 10 mg by mouth daily.    Yes [provider]  furosemide (LASIX) 40 MG  tablet Take 40 mg by mouth daily. 07/17/17  Yes [provider]  glimepiride (AMARYL) 2 MG tablet Take 2 mg by mouth 2 (two) times daily.   Yes [provider]  hydrochlorothiazide (HYDRODIURIL) 25 MG tablet Take 25 mg by mouth every morning.   Yes [provider]  lisinopril (PRINIVIL,ZESTRIL) 40 MG tablet Take 40 mg by mouth 2 (two) times daily.   Yes [provider]  metFORMIN (GLUMETZA) 1000 MG (MOD) 24 hr tablet Take 1,000 mg by mouth 2 (two) times daily.   Yes [provider]  metoprolol (LOPRESSOR) 100 MG tablet Take 100 mg by mouth 2  (two) times daily.   Yes [provider]  Omega-3 Fatty Acids (FISH OIL) 1200 MG CAPS Take 2,400 mg by mouth 2 (two) times daily.   Yes [provider]  potassium chloride SA (K-DUR,KLOR-CON) 20 MEQ tablet Take 20 mEq by mouth every morning.   Yes [provider]  tamsulosin (FLOMAX) 0.4 MG CAPS capsule Take 0.8 mg by mouth daily after supper.   Yes [provider]  ciprofloxacin (CIPRO) 500 MG tablet Take 1 tablet (500 mg total) by mouth 2 (two) times daily. Patient not taking: Reported on 08/08/2017 05/08/17   Ihor Gully, MD  indomethacin (INDOCIN) 25 MG capsule Take 1 capsule (25 mg total) by mouth 3 (three) times daily as needed. Patient not taking: Reported on 08/08/2017 02/22/17   Tyrone Nine, MD  tolterodine (DETROL LA) 4 MG 24 hr capsule Take 1 capsule (4 mg total) by mouth daily. Patient not taking: Reported on 08/08/2017 05/08/17   Ihor Gully, MD    Family History Family History  Problem Relation Age of Onset  . Congestive Heart Failure Mother   . Hypertension Brother   . Hypertension Daughter   . Hypertension Son     Social History Social History  Substance Use Topics  . Smoking status: Never Smoker  . Smokeless tobacco: Never Used  . Alcohol use No     Allergies   Sulfa antibiotics   Review of Systems Review of Systems  Constitutional: Positive for chills.  Respiratory: Positive for shortness of breath.   Cardiovascular: Positive for chest pain. Negative for palpitations and leg swelling.  Gastrointestinal: Positive for abdominal pain, nausea and vomiting.  All other systems reviewed and are negative.    Physical Exam Updated Vital Signs BP 109/61   Pulse (!) 118   Temp 100.2 F (37.9 C) (Oral)   Resp 20   Ht 5\' 6"  (1.676 m)   Wt 72.6 kg (160 lb)   SpO2 94%   BMI 25.82 kg/m   Physical Exam  Constitutional: He is oriented to person, place, and time. He appears well-developed and well-nourished. No distress.    HENT:  Head: Normocephalic and atraumatic.  Cardiovascular: Normal rate, regular rhythm and normal heart sounds.   No murmur heard. Pulmonary/Chest: Effort normal and breath sounds normal. No respiratory distress.  Abdominal: Soft. He exhibits no distension.  Diffuse generalized abdominal tenderness. No rebound or guarding. No focal area of tenderness. Negative Murphy's.   Musculoskeletal: He exhibits no edema.  Neurological: He is alert and oriented to person, place, and time.  Skin: Skin is warm and dry.  Nursing note and vitals reviewed.    ED Treatments / Results  Labs (all labs ordered are listed, but only abnormal results are displayed) Labs Reviewed  COMPREHENSIVE METABOLIC PANEL - Abnormal; Notable for the following:       Result  Value   CO2 18 (*)    Glucose, Bld 204 (*)    BUN 76 (*)    Creatinine, Ser 2.84 (*)    Total Protein 8.6 (*)    Albumin 3.4 (*)    GFR calc non Af Amer 22 (*)    GFR calc Af Amer 25 (*)    All other components within normal limits  CBC - Abnormal; Notable for the following:    WBC 15.7 (*)    RDW 16.5 (*)    Platelets 143 (*)    All other components within normal limits  URINALYSIS, ROUTINE W REFLEX MICROSCOPIC - Abnormal; Notable for the following:    APPearance CLOUDY (*)    Glucose, UA 50 (*)    Hgb urine dipstick MODERATE (*)    Ketones, ur 5 (*)    Protein, ur 100 (*)    Leukocytes, UA LARGE (*)    Bacteria, UA MANY (*)    Squamous Epithelial / LPF 0-5 (*)    Non Squamous Epithelial 0-5 (*)    All other components within normal limits  URINE CULTURE  CULTURE, BLOOD (ROUTINE X 2)  CULTURE, BLOOD (ROUTINE X 2)  LIPASE, BLOOD  BRAIN NATRIURETIC PEPTIDE  I-STAT TROPONIN, ED  I-STAT CG4 LACTIC ACID, ED  I-STAT CG4 LACTIC ACID, ED    EKG  EKG Interpretation None       Radiology Dg Chest 2 View  Result Date: 08/08/2017 CLINICAL DATA:  Weakness and shortness of breath EXAM: CHEST  2 VIEW COMPARISON:  Report 06/16/2011  FINDINGS: The heart size and mediastinal contours are within normal limits. Both lungs are clear. Calcific tendinitis at the right shoulder IMPRESSION: No active cardiopulmonary disease. Electronically Signed   By: Jasmine Pang M.D.   On: 08/08/2017 19:39   Ct Renal Stone Study  Result Date: 08/08/2017 CLINICAL DATA:  Nausea and emesis EXAM: CT ABDOMEN AND PELVIS WITHOUT CONTRAST TECHNIQUE: Multidetector CT imaging of the abdomen and pelvis was performed following the standard protocol without IV contrast. COMPARISON:  05/05/2017, 03/28/2017 FINDINGS: Lower chest: Lung bases demonstrate no acute consolidation or pleural effusion. Normal heart size. Coronary artery calcification. Hepatobiliary: Multiple low-density lesions consistent with cysts. Largest is seen in the inferior right hepatic lobe and measures 5.9 cm. Dilated gallbladder up to 5 cm. Small calcified stone. No biliary dilatation. Pancreas: Unremarkable. No pancreatic ductal dilatation or surrounding inflammatory changes. Spleen: Normal in size without focal abnormality.  Splenic granuloma Adrenals/Urinary Tract: Adrenal glands are within normal limits. Negative for right hydronephrosis. Multiple hypodense masses within the bilateral kidneys, some of which are consistent with cysts, others are indeterminate. Interim placement of left-sided ureteral stent with decreased hydronephrosis since the prior study. Marked wall thickening of the urinary bladder. Stomach/Bowel: Stomach within normal limits. No dilated small bowel. No colon wall thickening. Status post appendectomy Vascular/Lymphatic: Aortic atherosclerosis. No enlarged abdominal or pelvic lymph nodes. Reproductive: Enlarged prostate with calcification Other: Negative for free air or free fluid Musculoskeletal: Degenerative changes. Chronic appearing superior compression at T12. IMPRESSION: 1. Interval placement of left-sided ureteral stent with decreased hydronephrosis since the prior study.  Negative for right hydronephrosis, kidney stone, or ureteral stone. There remains significant diffuse bladder wall thickening which may be secondary to cystitis, chronic obstruction, or neoplasm 2. Dilated gallbladder containing small calcified stone. No surrounding wall thickening or fluid, correlation with ultrasound as indicated 3. Multiple low-density masses within both kidneys, some are complex and in completely evaluated without contrast material. Further evaluation with  MRI or renal CT could be obtained when clinically feasible 4. Hepatic cysts Electronically Signed   By: Jasmine Pang M.D.   On: 08/08/2017 23:13    Procedures Procedures (including critical care time)  Medications Ordered in ED Medications  sodium chloride 0.9 % bolus 250 mL (not administered)  ondansetron (ZOFRAN) injection 4 mg (4 mg Intravenous Given 08/08/17 2124)  sodium chloride 0.9 % bolus 1,000 mL (0 mLs Intravenous Stopped 08/08/17 2200)  sodium chloride 0.9 % bolus 1,000 mL (1,000 mLs Intravenous New Bag/Given 08/08/17 2213)  acetaminophen (TYLENOL) tablet 650 mg (650 mg Oral Given 08/08/17 2212)  cefTRIAXone (ROCEPHIN) 1 g in dextrose 5 % 50 mL IVPB (1 g Intravenous New Bag/Given 08/08/17 2317)     Initial Impression / Assessment and Plan / ED Course  I have reviewed the triage vital signs and the nursing notes.  Pertinent labs & imaging results that were available during my care of the patient were reviewed by me and considered in my medical decision making (see chart for details).    Jerry York is a 66 y.o. male who presents to ED for nausea, vomiting and diffuse abdominal pain. Upon initial arrival, patient was afebrile but was tachycardic. IV fluids initiated. No peritoneal signs on abdominal exam. Lactic wdl. Leukocytosis of 15.7. Upon re-evaluation, temperature of 100.2. Tylenol and another liter fluid bolus given. Blood cultures obtained. CMP with acute worsening of kidney function. Creatine was 1.3  three months ago, 2.84 today. UA with large leuks, TNTC WBC and many bacteria. Urine culture obtained and Rocephin given. Of note, patient followed by urology, Dr. Vernie Ammons, and had cystoscopy with left stent placement on 05/08/2017. Since procedure, he has been self-catheterizing to urinate. Denies hx of UTI's in the past. CT obtained and reviewed with attending:    IMPRESSION: 1. Interval placement of left-sided ureteral stent with decreased hydronephrosis since the prior study. Negative for right hydronephrosis, kidney stone, or ureteral stone. There remains significant diffuse bladder wall thickening which may be secondary to cystitis, chronic obstruction, or neoplasm 2. Dilated gallbladder containing small calcified stone. No surrounding wall thickening or fluid, correlation with ultrasound as indicated 3. Multiple low-density masses within both kidneys, some are complex and in completely evaluated without contrast material. Further evaluation with MRI or renal CT could be obtained when clinically feasible 4. Hepatic cysts  Will obtain RUQ ultrasound. This was discussed with hospitalist who will follow up on [name] results and admit for further evaluation/management.   Patient seen by and discussed with Dr. Dalene Seltzer who agrees with treatment plan.    Final Clinical Impressions(s) / ED Diagnoses   Final diagnoses:  Shortness of breath  Abnormal CT scan  Complicated UTI (urinary tract infection)    New Prescriptions New Prescriptions   No medications on file     Tyffani Foglesong, Chase Picket, PA-C 08/08/17 2352    Alvira Monday, MD 08/09/17 [phone]

## 2017-08-08 NOTE — ED Notes (Signed)
ED Provider at bedside. 

## 2017-08-09 ENCOUNTER — Encounter (HOSPITAL_COMMUNITY): Payer: Self-pay | Admitting: Internal Medicine

## 2017-08-09 ENCOUNTER — Inpatient Hospital Stay (HOSPITAL_COMMUNITY): Payer: PPO

## 2017-08-09 DIAGNOSIS — F419 Anxiety disorder, unspecified: Secondary | ICD-10-CM | POA: Diagnosis present

## 2017-08-09 DIAGNOSIS — N390 Urinary tract infection, site not specified: Secondary | ICD-10-CM

## 2017-08-09 DIAGNOSIS — A419 Sepsis, unspecified organism: Principal | ICD-10-CM

## 2017-08-09 DIAGNOSIS — E119 Type 2 diabetes mellitus without complications: Secondary | ICD-10-CM

## 2017-08-09 DIAGNOSIS — M109 Gout, unspecified: Secondary | ICD-10-CM | POA: Insufficient documentation

## 2017-08-09 DIAGNOSIS — R072 Precordial pain: Secondary | ICD-10-CM

## 2017-08-09 DIAGNOSIS — R079 Chest pain, unspecified: Secondary | ICD-10-CM | POA: Diagnosis present

## 2017-08-09 DIAGNOSIS — N1330 Unspecified hydronephrosis: Secondary | ICD-10-CM | POA: Diagnosis present

## 2017-08-09 LAB — CBC WITH DIFFERENTIAL/PLATELET
BASOS PCT: 0 %
Basophils Absolute: 0 10*3/uL (ref 0.0–0.1)
EOS ABS: 0 10*3/uL (ref 0.0–0.7)
Eosinophils Relative: 0 %
HEMATOCRIT: 32.4 % — AB (ref 39.0–52.0)
HEMOGLOBIN: 10.7 g/dL — AB (ref 13.0–17.0)
LYMPHS ABS: 1.5 10*3/uL (ref 0.7–4.0)
Lymphocytes Relative: 12 %
MCH: 31.1 pg (ref 26.0–34.0)
MCHC: 33 g/dL (ref 30.0–36.0)
MCV: 94.2 fL (ref 78.0–100.0)
MONO ABS: 1.1 10*3/uL — AB (ref 0.1–1.0)
MONOS PCT: 9 %
NEUTROS PCT: 79 %
Neutro Abs: 9.7 10*3/uL — ABNORMAL HIGH (ref 1.7–7.7)
Platelets: 134 10*3/uL — ABNORMAL LOW (ref 150–400)
RBC: 3.44 MIL/uL — ABNORMAL LOW (ref 4.22–5.81)
RDW: 16.6 % — AB (ref 11.5–15.5)
WBC: 12.4 10*3/uL — ABNORMAL HIGH (ref 4.0–10.5)

## 2017-08-09 LAB — GLUCOSE, CAPILLARY
GLUCOSE-CAPILLARY: 52 mg/dL — AB (ref 65–99)
GLUCOSE-CAPILLARY: 56 mg/dL — AB (ref 65–99)
GLUCOSE-CAPILLARY: 69 mg/dL (ref 65–99)
GLUCOSE-CAPILLARY: 72 mg/dL (ref 65–99)
Glucose-Capillary: 122 mg/dL — ABNORMAL HIGH (ref 65–99)
Glucose-Capillary: 54 mg/dL — ABNORMAL LOW (ref 65–99)
Glucose-Capillary: 88 mg/dL (ref 65–99)
Glucose-Capillary: 81 mg/dL (ref 65–99)

## 2017-08-09 LAB — ECHOCARDIOGRAM COMPLETE
CHL CUP DOP CALC LVOT VTI: 25.6 cm
E/e' ratio: 8.77
FS: 16 % — AB (ref 28–44)
Height: 66 in
IVS/LV PW RATIO, ED: 1.6
LA diam end sys: 40.8 mm
LADIAMINDEX: 2.21 cm/m2
LASIZE: 40.8 mm
LV E/e' medial: 8.77
LV E/e'average: 8.77
LVELAT: 9.42 cm/s
LVOT area: 3.37 cm2
LVOTD: 20.7 mm
LVOTSV: 86.4 mL
MVPG: 3 mmHg
MVPKAVEL: 120 m/s
MVPKEVEL: 82.6 m/s
PW: 10.6 mm — AB (ref 0.6–1.1)
TDI e' lateral: 9.42
TDI e' medial: 7.12
WEIGHTICAEL: 2662.4 [oz_av]

## 2017-08-09 LAB — BASIC METABOLIC PANEL
Anion gap: 10 (ref 5–15)
BUN: 69 mg/dL — AB (ref 6–20)
CALCIUM: 8.2 mg/dL — AB (ref 8.9–10.3)
CHLORIDE: 109 mmol/L (ref 101–111)
CO2: 19 mmol/L — AB (ref 22–32)
CREATININE: 2.29 mg/dL — AB (ref 0.61–1.24)
GFR calc non Af Amer: 28 mL/min — ABNORMAL LOW (ref >60)
GFR, EST AFRICAN AMERICAN: 33 mL/min — AB (ref >60)
Glucose, Bld: 171 mg/dL — ABNORMAL HIGH (ref 65–99)
Potassium: 4.2 mmol/L (ref 3.5–5.1)
SODIUM: 138 mmol/L (ref 135–145)

## 2017-08-09 LAB — HIV ANTIBODY (ROUTINE TESTING W REFLEX): HIV SCREEN 4TH GENERATION: NONREACTIVE

## 2017-08-09 LAB — TROPONIN I: Troponin I: 0.04 ng/mL (ref <0.03)

## 2017-08-09 LAB — MAGNESIUM: Magnesium: 2.1 mg/dL (ref 1.7–2.4)

## 2017-08-09 MED ORDER — AMLODIPINE BESYLATE 10 MG PO TABS
10.0000 mg | ORAL_TABLET | Freq: Every day | ORAL | Status: DC
  Administered 2017-08-09 – 2017-08-10 (×2): 10 mg via ORAL
  Filled 2017-08-09 (×3): qty 1

## 2017-08-09 MED ORDER — CLONAZEPAM 0.5 MG PO TABS: 0.5000 mg | ORAL_TABLET | Freq: Two times a day (BID) | ORAL | Status: DC | PRN

## 2017-08-09 MED ORDER — METOPROLOL TARTRATE 50 MG PO TABS
100.0000 mg | ORAL_TABLET | Freq: Two times a day (BID) | ORAL | Status: DC
  Administered 2017-08-09 – 2017-08-10 (×4): 100 mg via ORAL
  Filled 2017-08-09 (×3): qty 2

## 2017-08-09 MED ORDER — ASPIRIN 325 MG PO TABS
325.0000 mg | ORAL_TABLET | Freq: Two times a day (BID) | ORAL | Status: DC
  Administered 2017-08-09: 325 mg via ORAL
  Filled 2017-08-09 (×2): qty 1

## 2017-08-09 MED ORDER — PROMETHAZINE HCL 25 MG/ML IJ SOLN
12.5000 mg | Freq: Four times a day (QID) | INTRAMUSCULAR | Status: DC | PRN
  Administered 2017-08-09 (×2): 12.5 mg via INTRAVENOUS
  Filled 2017-08-09 (×2): qty 1

## 2017-08-09 MED ORDER — PROMETHAZINE HCL 25 MG/ML IJ SOLN
12.5000 mg | Freq: Once | INTRAMUSCULAR | Status: AC
  Administered 2017-08-09: 12.5 mg via INTRAVENOUS
  Filled 2017-08-09: qty 1

## 2017-08-09 MED ORDER — INSULIN ASPART 100 UNIT/ML ~~LOC~~ SOLN
0.0000 [IU] | Freq: Three times a day (TID) | SUBCUTANEOUS | Status: DC
  Administered 2017-08-09: 1 [IU] via SUBCUTANEOUS

## 2017-08-09 MED ORDER — ATORVASTATIN CALCIUM 20 MG PO TABS
20.0000 mg | ORAL_TABLET | Freq: Every evening | ORAL | Status: DC
  Administered 2017-08-09: 20 mg via ORAL
  Filled 2017-08-09: qty 1

## 2017-08-09 MED ORDER — GLIMEPIRIDE 2 MG PO TABS
2.0000 mg | ORAL_TABLET | Freq: Two times a day (BID) | ORAL | Status: DC
  Administered 2017-08-09: 2 mg via ORAL
  Filled 2017-08-09 (×2): qty 1

## 2017-08-09 MED ORDER — CEFTRIAXONE SODIUM 1 G IJ SOLR
1.0000 g | INTRAMUSCULAR | Status: DC
  Administered 2017-08-09: 1 g via INTRAVENOUS
  Filled 2017-08-09 (×2): qty 10

## 2017-08-09 MED ORDER — ENOXAPARIN SODIUM 30 MG/0.3ML ~~LOC~~ SOLN
30.0000 mg | SUBCUTANEOUS | Status: DC
  Administered 2017-08-09: 30 mg via SUBCUTANEOUS
  Filled 2017-08-09: qty 0.3

## 2017-08-09 MED ORDER — SODIUM CHLORIDE 0.45 % IV SOLN
INTRAVENOUS | Status: AC
  Administered 2017-08-09 (×3): via INTRAVENOUS

## 2017-08-09 MED ORDER — ASPIRIN 325 MG PO TABS
325.0000 mg | ORAL_TABLET | Freq: Every day | ORAL | Status: DC
Start: 2017-08-10 — End: 2017-08-10
  Administered 2017-08-10: 325 mg via ORAL
  Filled 2017-08-09: qty 1

## 2017-08-09 MED ORDER — ONDANSETRON HCL 4 MG/2ML IJ SOLN: 4.0000 mg | Freq: Four times a day (QID) | INTRAMUSCULAR | Status: DC | PRN

## 2017-08-09 MED ORDER — PRO-STAT SUGAR FREE PO LIQD
30.0000 mL | Freq: Two times a day (BID) | ORAL | Status: DC
  Administered 2017-08-09 – 2017-08-10 (×2): 30 mL via ORAL
  Filled 2017-08-09 (×2): qty 30

## 2017-08-09 MED ORDER — TAMSULOSIN HCL 0.4 MG PO CAPS
0.8000 mg | ORAL_CAPSULE | Freq: Every day | ORAL | Status: DC
  Administered 2017-08-09: 0.8 mg via ORAL
  Filled 2017-08-09: qty 2

## 2017-08-09 MED ORDER — ACETAMINOPHEN 325 MG PO TABS
650.0000 mg | ORAL_TABLET | Freq: Four times a day (QID) | ORAL | Status: DC | PRN
  Filled 2017-08-09: qty 2

## 2017-08-09 MED ORDER — ONDANSETRON HCL 4 MG PO TABS
4.0000 mg | ORAL_TABLET | Freq: Four times a day (QID) | ORAL | Status: DC | PRN
  Administered 2017-08-09: 4 mg via ORAL
  Filled 2017-08-09: qty 1

## 2017-08-09 MED ORDER — CANAGLIFLOZIN 100 MG PO TABS: 100.0000 mg | ORAL_TABLET | Freq: Every day | ORAL | Status: DC

## 2017-08-09 NOTE — Evaluation (Signed)
Physical Therapy Evaluation Patient Details Name: Jerry York MRN: [id number] DOB: 1951-05-03 Today's Date: 08/09/2017   History of Present Illness  Pt is a 66 y.o. male with PMHx of anxiety, bilateral renal cysts, gout, heart murmur, hyperlipidemia, hypertension, chronic renal insufficiency, type 2 diabetes mellitus, bilateral hydronephrosis due to obstructive uropathy secondary to BPH, status post left ureter stent placement in May by Dr. Vernie Ammons due to persistent left hydronephrosis in May 08, 2017. Pt presented to the ED with complaints of progressively worse nausea, low-grade temperatures, night sweats, decreased appetite, fatigue, malaise and multiple episodes of emesis. Pt admitted for sepsis secondary to UTI.   Clinical Impression  Pt admitted with the above conditions. Pt currently with functional limitations due to the deficits listed below. Pt will benefit from skilled PT to increase their independence and safety with mobility to allow discharge. Pt tolerated session well but had flat affect throughout session. Pt reported SOB during ambulation but SPO2 remained above 94% on RA. Pt required 2 standing rest breaks during ambulation.     Follow Up Recommendations Outpatient PT (Outpatient if pt agrees )    Equipment Recommendations  None recommended by PT    Recommendations for Other Services       Precautions / Restrictions Precautions Precaution Comments: Monitor sats as pt reports SOB  Restrictions Weight Bearing Restrictions: No      Mobility  Bed Mobility Overal bed mobility: Needs Assistance Bed Mobility: Sidelying to Sit   Sidelying to sit: Supervision;HOB elevated       General bed mobility comments: Pt able to manage LE and rise and steady trunk at EOB, Pt did not report dizziness, pain or nausea   Transfers Overall transfer level: Needs assistance Equipment used: 1 person hand held assist Transfers: Sit to/from Stand Sit to Stand: Min assist          General transfer comment: Pt required min A to rise from EOB, O2 Sats above 94% on RA   Ambulation/Gait Ambulation/Gait assistance: Min guard Ambulation Distance (Feet): 120 Feet Assistive device:  (Pt used IV pole for support ) Gait Pattern/deviations: Step-through pattern;Decreased stride length;Trunk flexed Gait velocity: decr    General Gait Details: Pt required 2 standing rest breaks during ambulation and reported SOB, O2 sats remained elevated above 94% on RA, pt used IV pole for support   Stairs            Wheelchair Mobility    Modified Rankin (Stroke Patients Only)       Balance Overall balance assessment: Needs assistance   Sitting balance-Leahy Scale: Good       Standing balance-Leahy Scale: Fair Standing balance comment: Pt able to rise and steady but required IV pole for support for higher level activity                              Pertinent Vitals/Pain Pain Assessment: No/denies pain    Home Living Family/patient expects to be discharged to:: Private residence Living Arrangements: Spouse/significant other Available Help at Discharge: Family Type of Home: House Home Access: Stairs to enter   Secretary/administrator of Steps: 3 Home Layout: One level Home Equipment: None      Prior Function Level of Independence: Independent               Hand Dominance        Extremity/Trunk Assessment   Upper Extremity Assessment Upper Extremity Assessment: Overall WFL for tasks  assessed    Lower Extremity Assessment Lower Extremity Assessment: Generalized weakness       Communication   Communication: No difficulties  Cognition Arousal/Alertness: Awake/alert Behavior During Therapy: Flat affect Overall Cognitive Status: Within Functional Limits for tasks assessed                                        General Comments      Exercises     Assessment/Plan    PT Assessment Patient needs continued PT  services  PT Problem List Decreased mobility;Decreased safety awareness;Decreased balance;Decreased activity tolerance       PT Treatment Interventions Gait training;Therapeutic exercise;Therapeutic activities;Patient/family education;Balance training;Functional mobility training;Neuromuscular re-education    PT Goals (Current goals can be found in the Care Plan section)  Acute Rehab PT Goals Patient Stated Goal: Pt would like to return home  PT Goal Formulation: With patient Time For Goal Achievement: 08/23/17 Potential to Achieve Goals: Good    Frequency Min 3X/week   Barriers to discharge        Co-evaluation               AM-PAC PT "6 Clicks" Daily Activity  Outcome Measure Difficulty turning over in bed (including adjusting bedclothes, sheets and blankets)?: A Little Difficulty moving from lying on back to sitting on the side of the bed? : A Little Difficulty sitting down on and standing up from a chair with arms (e.g., wheelchair, bedside commode, etc,.)?: Total Help needed moving to and from a bed to chair (including a wheelchair)?: A Little Help needed walking in hospital room?: A Little Help needed climbing 3-5 steps with a railing? : A Little 6 Click Score: 16    End of Session Equipment Utilized During Treatment: Gait belt Activity Tolerance: Patient tolerated treatment well Patient left: in bed;with family/visitor present (Pt wanted to sit at EOB when PT arrived, RN notified ) Nurse Communication: Mobility status PT Visit Diagnosis: Difficulty in walking, not elsewhere classified (R26.2)    Time: 6237-6283 PT Time Calculation (min) (ACUTE ONLY): 12 min   Charges:   PT Evaluation $PT Eval Low Complexity: 1 Low     PT G CodesMarlene Bast, SPT   Kathrene Bongo 08/09/2017, 4:09 PM

## 2017-08-09 NOTE — Progress Notes (Signed)
Pt expressed to RN that he self caths at home, Dr. Toniann Fail gave this RN a verbal order for pt to self cath while here in the hospital.

## 2017-08-09 NOTE — Progress Notes (Signed)
Report should be called to [facility] [phone] at 0030.

## 2017-08-09 NOTE — Progress Notes (Signed)
PROGRESS NOTE    Jerry York  OVF:[id number] DOB: 1951-05-30 DOA: 08/08/2017 PCP: Darci Needle, MD     Brief Narrative:  Jerry York is a 66 y.o. male with medical history significant of anxiety, bilateral renal cysts, gout, heart murmur, hyperlipidemia, hypertension, chronic renal insufficiency, type 2 diabetes mellitus, bilateral hydronephrosis due to obstructive uropathy secondary to BPH, status post left ureter stent placement in May by Dr. Vernie Ammons due to persistent left hydronephrosis in May 08, 2017 who is coming to the emergency department with complaints of progressively worse nausea since Saturday, low-grade temperatures, night sweats, decreased appetite, fatigue, malaise and multiple episodes of emesis since Monday, including one episode earlier today associated with diffuse abdominal pain. He denies dysuria, since he has been self cathing his bladder since May. He also complains of central, nonradiating, sharp chest pain without any other symptoms.   Assessment & Plan:   Principal Problem:   Sepsis secondary to UTI [facility]) Active Problems:   AKI (acute kidney injury) (HCC)   Hyperlipidemia   Hypertension   Type 2 diabetes mellitus (HCC)   Hydronephrosis, left   Anxiety   Atypical chest pain   Sepsis secondary to UTI, POA -Due to left hydronephrosis, had left ureteral stent placement with urology. Self-caths at baseline. -CT renal stone study with decreased hydronephrosis since the prior study -Blood culture pending -Urine culture pending -Continue rocephin  AKI -Baseline Cr 1.3  -IVF  -Improving with supportive care, monitor  Atypical chest pain -EKG with normal sinus, RBBB and left anterior fascicular block without ST changes  -Trop 0.04 --> 0.04 -Echocardiogram ordered to eval wall motion -Aspirin   Hyperlipidemia -Continue lipitor  HTN -Continue amlodipine, metoprolol  DM type 2 -Hold metformin in setting of AKI. Will hold other oral agents as  well  -Continue SSI   Anxiety -Continue clonazepam   DVT prophylaxis: lovenox Code Status: Full Family Communication: wife at bedside Disposition Plan: pending improvement   Consultants:   I informed Dr. Vernie Ammons regarding patient admission as FYI, per family request. No formal consult.   Procedures:   None   Antimicrobials:  Anti-infectives    Start     Dose/Rate Route Frequency Ordered Stop   08/09/17 2200  cefTRIAXone (ROCEPHIN) 1 g in dextrose 5 % 50 mL IVPB     1 g 100 mL/hr over 30 Minutes Intravenous Every 24 hours 08/09/17 0230     08/08/17 2215  cefTRIAXone (ROCEPHIN) 1 g in dextrose 5 % 50 mL IVPB     1 g 100 mL/hr over 30 Minutes Intravenous  Once 08/08/17 2212 08/09/17 0039       Subjective: Admits to intermittent "cramping" and sharp chest pain. Some discomfort on left flank, but no other abdominal pain. Felt feverish overnight.   Objective: Vitals:   08/09/17 0211 08/09/17 0501 08/09/17 1133 08/09/17 1324  BP: 114/65 110/62 121/68 122/65  Pulse: 88 78  91  Resp: 20 20  20   Temp: 98.5 F (36.9 C) 97.6 F (36.4 C)  100 F (37.8 C)  TempSrc: Oral Oral  Oral  SpO2: 97% 94%  95%  Weight: 75.5 kg (166 lb 6.4 oz)     Height: 5\' 6"  (1.676 m)       Intake/Output Summary (Last 24 hours) at 08/09/17 1405 Last data filed at 08/09/17 0600  Gross per 24 hour  Intake           410.42 ml  Output  0 ml  Net           410.42 ml   Filed Weights   08/08/17 1756 08/09/17 0211  Weight: 72.6 kg (160 lb) 75.5 kg (166 lb 6.4 oz)    Examination:  General exam: Appears calm and comfortable  Respiratory system: Clear to auscultation. Respiratory effort normal. Cardiovascular system: S1 & S2 heard, RRR. No JVD, murmurs, rubs, gallops or clicks. No pedal edema. Gastrointestinal system: Abdomen is nondistended, soft and nontender. No organomegaly or masses felt. Normal bowel sounds heard. Central nervous system: Alert and oriented. No focal  neurological deficits. Extremities: Symmetric Skin: No rashes, lesions or ulcers Psychiatry: Judgement and insight appear normal. Mood & affect appropriate.   Data Reviewed: I have personally reviewed following labs and imaging studies  CBC:  Recent Labs Lab 08/08/17 1829 08/09/17 0437  WBC 15.7* 12.4*  NEUTROABS  --  9.7*  HGB 13.8 10.7*  HCT 40.3 32.4*  MCV 92.6 94.2  PLT 143* 134*   Basic Metabolic Panel:  Recent Labs Lab 08/08/17 1829 08/09/17 0437  NA 137 138  K 4.6 4.2  CL 105 109  CO2 18* 19*  GLUCOSE 204* 171*  BUN 76* 69*  CREATININE 2.84* 2.29*  CALCIUM 9.6 8.2*  MG 2.1  --    GFR: Estimated Creatinine Clearance: 29 mL/min (A) (by C-G formula based on SCr of 2.29 mg/dL (H)). Liver Function Tests:  Recent Labs Lab 08/08/17 1829  AST 16  ALT 19  ALKPHOS 52  BILITOT 0.7  PROT 8.6*  ALBUMIN 3.4*    Recent Labs Lab 08/08/17 1829  LIPASE 36   No results for input(s): AMMONIA in the last 168 hours. Coagulation Profile: No results for input(s): INR, PROTIME in the last 168 hours. Cardiac Enzymes:  Recent Labs Lab 08/09/17 0437  TROPONINI 0.04*   BNP (last 3 results) No results for input(s): PROBNP in the last 8760 hours. HbA1C: No results for input(s): HGBA1C in the last 72 hours. CBG:  Recent Labs Lab 08/09/17 0724 08/09/17 1205 08/09/17 1242  GLUCAP 122* 54* 88   Lipid Profile: No results for input(s): CHOL, HDL, LDLCALC, TRIG, CHOLHDL, LDLDIRECT in the last 72 hours. Thyroid Function Tests: No results for input(s): TSH, T4TOTAL, FREET4, T3FREE, THYROIDAB in the last 72 hours. Anemia Panel: No results for input(s): VITAMINB12, FOLATE, FERRITIN, TIBC, IRON, RETICCTPCT in the last 72 hours. Sepsis Labs:  Recent Labs Lab 08/08/17 2046  LATICACIDVEN 1.32    No results found for this or any previous visit (from the past 240 hour(s)).     Radiology Studies: Dg Chest 2 View  Result Date: 08/08/2017 CLINICAL DATA:   Weakness and shortness of breath EXAM: CHEST  2 VIEW COMPARISON:  Report 06/16/2011 FINDINGS: The heart size and mediastinal contours are within normal limits. Both lungs are clear. Calcific tendinitis at the right shoulder IMPRESSION: No active cardiopulmonary disease. Electronically Signed   By: Jasmine Pang M.D.   On: 08/08/2017 19:39   Ct Renal Stone Study  Result Date: 08/08/2017 CLINICAL DATA:  Nausea and emesis EXAM: CT ABDOMEN AND PELVIS WITHOUT CONTRAST TECHNIQUE: Multidetector CT imaging of the abdomen and pelvis was performed following the standard protocol without IV contrast. COMPARISON:  05/05/2017, 03/28/2017 FINDINGS: Lower chest: Lung bases demonstrate no acute consolidation or pleural effusion. Normal heart size. Coronary artery calcification. Hepatobiliary: Multiple low-density lesions consistent with cysts. Largest is seen in the inferior right hepatic lobe and measures 5.9 cm. Dilated gallbladder up to 5 cm. Small  calcified stone. No biliary dilatation. Pancreas: Unremarkable. No pancreatic ductal dilatation or surrounding inflammatory changes. Spleen: Normal in size without focal abnormality.  Splenic granuloma Adrenals/Urinary Tract: Adrenal glands are within normal limits. Negative for right hydronephrosis. Multiple hypodense masses within the bilateral kidneys, some of which are consistent with cysts, others are indeterminate. Interim placement of left-sided ureteral stent with decreased hydronephrosis since the prior study. Marked wall thickening of the urinary bladder. Stomach/Bowel: Stomach within normal limits. No dilated small bowel. No colon wall thickening. Status post appendectomy Vascular/Lymphatic: Aortic atherosclerosis. No enlarged abdominal or pelvic lymph nodes. Reproductive: Enlarged prostate with calcification Other: Negative for free air or free fluid Musculoskeletal: Degenerative changes. Chronic appearing superior compression at T12. IMPRESSION: 1. Interval placement  of left-sided ureteral stent with decreased hydronephrosis since the prior study. Negative for right hydronephrosis, kidney stone, or ureteral stone. There remains significant diffuse bladder wall thickening which may be secondary to cystitis, chronic obstruction, or neoplasm 2. Dilated gallbladder containing small calcified stone. No surrounding wall thickening or fluid, correlation with ultrasound as indicated 3. Multiple low-density masses within both kidneys, some are complex and in completely evaluated without contrast material. Further evaluation with MRI or renal CT could be obtained when clinically feasible 4. Hepatic cysts Electronically Signed   By: Jasmine Pang M.D.   On: 08/08/2017 23:13   US Abdomen Limited Ruq  Result Date: 08/09/2017 CLINICAL DATA:  Abnormal CT, liver cysts and gallstones, abdominal pain EXAM: ULTRASOUND ABDOMEN LIMITED RIGHT UPPER QUADRANT COMPARISON:  CT 08/08/2017 FINDINGS: Gallbladder: Dilated gallbladder containing sludge and stones. Normal wall thickness. Negative sonographic Murphy's. Common bile duct: Diameter: 4.1 mm Liver: Echogenic liver. Multiple cysts. Cyst in the left hepatic lobe measures 1.6 x 1.5 x 1.5 cm. Cyst in the right hepatic lobe measures 5.1 x 5.7 x 4.7 cm IMPRESSION: 1. Slightly dilated gallbladder containing sludge and stones. Negative for was wall thickening or sonographic Murphy. Negative for biliary dilatation 2. Echogenic slightly fatty liver containing multiple cysts Electronically Signed   By: Jasmine Pang M.D.   On: 08/09/2017 01:18      Scheduled Meds: . amLODipine  10 mg Oral Daily  . [START ON 08/10/2017] aspirin  325 mg Oral Daily  . atorvastatin  20 mg Oral QPM  . enoxaparin (LOVENOX) injection  30 mg Subcutaneous Q24H  . feeding supplement (PRO-STAT SUGAR FREE 64)  30 mL Oral BID  . insulin aspart  0-9 Units Subcutaneous TID WC  . metoprolol tartrate  100 mg Oral BID  . tamsulosin  0.8 mg Oral QPC supper   Continuous  Infusions: . sodium chloride 125 mL/hr at 08/09/17 1033  . cefTRIAXone (ROCEPHIN)  IV       LOS: 1 day    Time spent: 40 minutes   Noralee Stain, DO [facility] [email or web address] Password [email or web address] 08/09/2017, 2:05 PM

## 2017-08-09 NOTE — Progress Notes (Addendum)
Initial Nutrition Assessment  INTERVENTION:   -Recommend provide nausea medications prior to mealtimes -Provide Prostat liquid protein PO 30 ml BID with meals, each supplement provides 100 kcal, 15 grams protein.  RD will continue to monitor  NUTRITION DIAGNOSIS:   Malnutrition (moderate) related to nausea, vomiting, acute illness as evidenced by percent weight loss, moderate depletions of muscle mass.  GOAL:   Patient will meet greater than or equal to 90% of their needs  MONITOR:   PO intake, Supplement acceptance, Labs, Weight trends, I & O's  REASON FOR ASSESSMENT:   Malnutrition Screening Tool    ASSESSMENT:   66 y.o. male with medical history significant of anxiety, bilateral renal cysts, gout, heart murmur, hyperlipidemia, hypertension, chronic renal insufficiency, type 2 diabetes mellitus, bilateral hydronephrosis due to obstructive uropathy secondary to BPH, status post left ureter stent placement in May by Dr. Vernie Ammons due to persistent left hydronephrosis in May 08, 2017 who is coming to the emergency department with complaints of progressively worse nausea since Saturday, low-grade temperatures, night sweats, decreased appetite, fatigue, malaise and multiple episodes of emesis since Monday, including one episode earlier today associated with diffuse abdominal pain. He denies dysuria, since he has been self cathing his bladder since May. He denies diarrhea, constipation, melena or hematochezia. No polyuria, no polydipsia, no blurred vision.  Pt with continued nausea. Has had N/V since 8/12. Last meal he consumed was at a [employer] on 8/11. Pt has been unable to tolerate even crackers. States Zofran has not helped but he did receive IV Phenergan which seems to work better.  Per pt's wife at bedside, pt has had progressive weight loss and weakness for months now. Pt c/o metallic tastes and states his food has not tasted right for some time. Reviewed strategies with  patient to help with taste changes.  Pt has tried Glucerna shakes in the past and pt states they made him sick. Pt and pt's wife are interested in him trying Prostat d/t the low volume amount. Will place order.  Pt reports UBW of 190 lb. Pt states he weighed 160 lb a few days ago. Per chart review ,pt has lost 27 lb (14% wt loss x 8 months, significant for time frame). Nutrition-Focused physical exam completed. Findings are no fat depletion, mild-moderate muscle depletion, and no edema.   Medications: Zofran tablet PRN, IV Phenergan once Labs reviewed: GFR: 28  Diet Order:  Diet heart healthy/carb modified Room service appropriate? Yes; Fluid consistency: Thin  Skin:  Reviewed, no issues  Last BM:  PTA  Height:   Ht Readings from Last 1 Encounters:  08/09/17 5\' 6"  (1.676 m)    Weight:   Wt Readings from Last 1 Encounters:  08/09/17 166 lb 6.4 oz (75.5 kg)    Ideal Body Weight:  64.5 kg  BMI:  Body mass index is 26.86 kg/m.  Estimated Nutritional Needs:   Kcal:  1850-2050  Protein:  85-95g  Fluid:  2L/day  EDUCATION NEEDS:   Education needs addressed  08/11/17, MS, RD, LDN Pager: [phone] After Hours Pager: [phone]

## 2017-08-09 NOTE — Progress Notes (Addendum)
CRITICAL VALUE ALERT  Critical Value:  Troponin 0.04  Date & Time Notied:  8/15 0550  Provider Notified: Toniann Fail   Orders Received/Actions taken: Pt. Has no c/o chest pain. Resting comfortably in the bed. MD gave verbal orders to administer one dose of aspirin 325 mg that is scheduled for 1000 now. Will administer medication and continue to monitor pt.

## 2017-08-09 NOTE — ED Notes (Signed)
Attempted to call report - floor unable to take at this time.

## 2017-08-09 NOTE — Progress Notes (Signed)
MEDICATION RELATED CONSULT NOTE - INITIAL   Pharmacy Consult for empagliflozin Indication: DM  Allergies  Allergen Reactions  . Sulfa Antibiotics Other (See Comments)    "make my feet burn"    Patient Measurements: Height: 5\' 6"  (167.6 cm) Weight: 166 lb 6.4 oz (75.5 kg) IBW/kg (Calculated) : 63.8 Adjusted Body Weight:   Vital Signs: Temp: 98.5 F (36.9 C) (08/15 0211) Temp Source: Oral (08/15 0211) BP: 114/65 (08/15 0211) Pulse Rate: 88 (08/15 0211) Intake/Output from previous day: No intake/output data recorded. Intake/Output from this shift: No intake/output data recorded.  Labs:  Recent Labs  08/08/17 1829  WBC 15.7*  HGB 13.8  HCT 40.3  PLT 143*  CREATININE 2.84*  MG 2.1  ALBUMIN 3.4*  PROT 8.6*  AST 16  ALT 19  ALKPHOS 52  BILITOT 0.7   Estimated Creatinine Clearance: 23.4 mL/min (A) (by C-G formula based on SCr of 2.84 mg/dL (H)).   Microbiology: No results found for this or any previous visit (from the past 720 hour(s)).  Medical History: Past Medical History:  Diagnosis Date  . Anxiety   . Bilateral renal cysts   . Foley catheter in place   . Gout    02-22-2017 acute right foot gout---  per pt resolved  . Gross hematuria   . Heart murmur   . Hydronephrosis, left   . Hyperlipidemia   . Hypertension   . Renal insufficiency   . Type 2 diabetes mellitus (HCC)   . Urinary retention   . Wears glasses     Medications:  Prescriptions Prior to Admission  Medication Sig Dispense Refill Last Dose  . acetaminophen (TYLENOL) 325 MG tablet Take 650 mg by mouth every 6 (six) hours as needed for moderate pain.   08/05/2017  . amLODipine (NORVASC) 10 MG tablet Take 10 mg by mouth daily.    Past Week at Unknown time  . aspirin 325 MG tablet Take 325 mg by mouth 2 (two) times daily.    Past Week at Unknown time  . atorvastatin (LIPITOR) 20 MG tablet Take 20 mg by mouth every evening.    Past Week at Unknown time  . [name] Take 5,000 mg by mouth  2 (two) times daily.    Past Week at Unknown time  . clonazePAM (KLONOPIN) 0.5 MG tablet Take 0.5 mg by mouth 2 (two) times daily as needed for anxiety.   Past Week at Unknown time  . Coenzyme Q10 (COQ-10) 100 MG CAPS Take 1 capsule by mouth 2 (two) times daily.   Past Week at Unknown time  . empagliflozin (JARDIANCE) 10 MG TABS tablet Take 10 mg by mouth daily.    Past Week at Unknown time  . furosemide (LASIX) 40 MG tablet Take 40 mg by mouth daily.   Past Week at Unknown time  . glimepiride (AMARYL) 2 MG tablet Take 2 mg by mouth 2 (two) times daily.   Past Week at Unknown time  . hydrochlorothiazide (HYDRODIURIL) 25 MG tablet Take 25 mg by mouth every morning.   Past Week at Unknown time  . metFORMIN (GLUMETZA) 1000 MG (MOD) 24 hr tablet Take 1,000 mg by mouth 2 (two) times daily.   Past Week at Unknown time  . metoprolol (LOPRESSOR) 100 MG tablet Take 100 mg by mouth 2 (two) times daily.   08/07/2017 at 1000  . Omega-3 Fatty Acids (FISH OIL) 1200 MG CAPS Take 2,400 mg by mouth 2 (two) times daily.   Past Week at  Unknown time  . potassium chloride SA (K-DUR,KLOR-CON) 20 MEQ tablet Take 20 mEq by mouth every morning.   Past Week at Unknown time  . tamsulosin (FLOMAX) 0.4 MG CAPS capsule Take 0.8 mg by mouth daily after supper.   Past Week at Unknown time  . ciprofloxacin (CIPRO) 500 MG tablet Take 1 tablet (500 mg total) by mouth 2 (two) times daily. (Patient not taking: Reported on 08/08/2017) 6 tablet 0 Completed Course at Unknown time  . indomethacin (INDOCIN) 25 MG capsule Take 1 capsule (25 mg total) by mouth 3 (three) times daily as needed. (Patient not taking: Reported on 08/08/2017) 30 capsule 0 Completed Course at Unknown time  . tolterodine (DETROL LA) 4 MG 24 hr capsule Take 1 capsule (4 mg total) by mouth daily. (Patient not taking: Reported on 08/08/2017) 30 capsule 0 Not Taking at Unknown time    Assessment: Patient taking empagliflozin for DM prior to admission.  Patient's renal  function is poor at < 30 on admit.  Empagliflozin is contraindicated at this renal function.  Goal of Therapy:  Safe and effective use of empagliflozin  Plan:  D/C empagliflozin F/u with renal function  Aleene Davidson Crowford 08/09/2017,2:24 AM

## 2017-08-09 NOTE — Progress Notes (Signed)
When attempting to administer aspirin 325 mg, pt. Stated he had no active chest pain, but was very nauseous. IV zofran administered and not effective. This RN paged on call MD Toniann Fail to inform him of pt's nausea. Verbal orders for 12.5 Phenergan IV obtained, along with holding aspirin since he has no active chest pain at the time. Will administer antiemetic and continue to monitor pt.

## 2017-08-09 NOTE — Progress Notes (Signed)
  Echocardiogram 2D Echocardiogram has been performed.  Jerry York M 08/09/2017, 12:57 PM

## 2017-08-09 NOTE — Care Management Note (Signed)
Case Management Note  Patient Details  Name: GANESH DEEG MRN: [id number] Date of Birth: 1951-08-11  Subjective/Objective: 66 y/o m admitted w/Sepsis from UTI. From home.                   Action/Plan:d/c plan home.   Expected Discharge Date:                  Expected Discharge Plan:  Home/Self Care  In-House Referral:     Discharge planning Services  CM Consult  Post Acute Care Choice:    Choice offered to:     DME Arranged:    DME Agency:     HH Arranged:    HH Agency:     Status of Service:  In process, will continue to follow  If discussed at Long Length of Stay Meetings, dates discussed:    Additional Comments:  Lanier Clam, RN 08/09/2017, 2:10 PM

## 2017-08-09 NOTE — Progress Notes (Signed)
PHARMACY NOTE -  ANTIBIOTIC RENAL DOSE ADJUSTMENT   Request received for Pharmacy to assist with antibiotic renal dose adjustment.  Patient has been initiated on Ceftriaxone 1gm iv q24hr   for UTI. SCr 2.84, estimated CrCl 23 ml/min Current dosage is appropriate and need for further dosage adjustment appears unlikely at present. Will sign off at this time.  Please reconsult if a change in clinical status warrants re-evaluation of dosage.

## 2017-08-10 ENCOUNTER — Encounter: Payer: Self-pay | Admitting: *Deleted

## 2017-08-10 ENCOUNTER — Other Ambulatory Visit: Payer: Self-pay | Admitting: *Deleted

## 2017-08-10 LAB — BASIC METABOLIC PANEL
ANION GAP: 11 (ref 5–15)
BUN: 44 mg/dL — ABNORMAL HIGH (ref 6–20)
CALCIUM: 7.9 mg/dL — AB (ref 8.9–10.3)
CHLORIDE: 110 mmol/L (ref 101–111)
CO2: 17 mmol/L — AB (ref 22–32)
Creatinine, Ser: 1.65 mg/dL — ABNORMAL HIGH (ref 0.61–1.24)
GFR calc non Af Amer: 42 mL/min — ABNORMAL LOW (ref >60)
GFR, EST AFRICAN AMERICAN: 49 mL/min — AB (ref >60)
Glucose, Bld: 60 mg/dL — ABNORMAL LOW (ref 65–99)
Potassium: 3.7 mmol/L (ref 3.5–5.1)
SODIUM: 138 mmol/L (ref 135–145)

## 2017-08-10 LAB — CBC WITH DIFFERENTIAL/PLATELET
BASOS PCT: 0 %
Basophils Absolute: 0 10*3/uL (ref 0.0–0.1)
Eosinophils Absolute: 0 10*3/uL (ref 0.0–0.7)
Eosinophils Relative: 0 %
HEMATOCRIT: 30.9 % — AB (ref 39.0–52.0)
HEMOGLOBIN: 10.2 g/dL — AB (ref 13.0–17.0)
Lymphocytes Relative: 14 %
Lymphs Abs: 1.4 10*3/uL (ref 0.7–4.0)
MCH: 30.5 pg (ref 26.0–34.0)
MCHC: 33 g/dL (ref 30.0–36.0)
MCV: 92.5 fL (ref 78.0–100.0)
Monocytes Absolute: 1.1 10*3/uL — ABNORMAL HIGH (ref 0.1–1.0)
Monocytes Relative: 11 %
NEUTROS ABS: 7.1 10*3/uL (ref 1.7–7.7)
NEUTROS PCT: 75 %
Platelets: 124 10*3/uL — ABNORMAL LOW (ref 150–400)
RBC: 3.34 MIL/uL — ABNORMAL LOW (ref 4.22–5.81)
RDW: 16.3 % — ABNORMAL HIGH (ref 11.5–15.5)
WBC: 9.6 10*3/uL (ref 4.0–10.5)

## 2017-08-10 LAB — GLUCOSE, CAPILLARY
GLUCOSE-CAPILLARY: 109 mg/dL — AB (ref 65–99)
Glucose-Capillary: 66 mg/dL (ref 65–99)
Glucose-Capillary: 92 mg/dL (ref 65–99)

## 2017-08-10 MED ORDER — ENOXAPARIN SODIUM 40 MG/0.4ML ~~LOC~~ SOLN
40.0000 mg | SUBCUTANEOUS | Status: DC
  Administered 2017-08-10: 40 mg via SUBCUTANEOUS
  Filled 2017-08-10: qty 0.4

## 2017-08-10 MED ORDER — CEPHALEXIN 500 MG PO CAPS: 500.0000 mg | ORAL_CAPSULE | Freq: Four times a day (QID) | ORAL | 0 refills | Status: DC

## 2017-08-10 NOTE — Discharge Summary (Addendum)
Physician Discharge Summary  Jerry York EHM:[id number] DOB: 10/21/1951 DOA: 08/08/2017  PCP: Darci Needle, MD. PCP has retired and patient currently without PCP. CM provided patient with list of PCP in area as resource. Encouraged patient to establish with new PCP ASAP.   Admit date: 08/08/2017 Discharge date: 08/10/2017  Admitted From: Home Disposition:  Home  Recommendations for Outpatient Follow-up:  1. Follow up with PCP in 1 week. Appointment scheduled prior to patient discharge with Dr. Jacquiline Doe on 8/20.  2. Follow up with Endocrinology, patient has appointment in October. 3. Follow up with Urology as scheduled in September  4. Please obtain BMP in 1 week to ensure resolution of AKI. Cr has been improving the past 48 hours.  5. Need DM control, has been hypoglycemic with oral glimepiride. Also due to AKI, will hold home metformin, jardiance, lasix, HCTZ. Need follow up with PCP.  6. Please follow up on the following pending results: final urine culture results, blood culture results. Discharged with keflex, urine culture and sensitivity unavailable prior to discharge from hospital.  7. Outpatient PT recommended, paper script provided   Home Health: No  Equipment/Devices: None   Discharge Condition: Stable CODE STATUS: Full  Diet recommendation: Heart healthy   Brief/Interim Summary: Jerry York a 66 y.o.malewith medical history significant of anxiety, bilateral renal cysts, gout, heart murmur, hyperlipidemia, hypertension, chronic renal insufficiency, type 2 diabetes mellitus, bilateral hydronephrosis due to obstructive uropathy secondary to BPH, status post left ureter stent placement in May by Dr. Vernie Ammons due to persistent left hydronephrosis in May 08, 2017 who is coming to the emergency department with complaints of progressively worse nausea since Saturday, low-grade temperatures, night sweats, decreased appetite, fatigue, malaise and multiple episodes of emesis  since Monday, including one episode earlier today associated with diffuse abdominal pain. He denies dysuria, since he has been self cathing his bladder since May. He also complains of central, nonradiating, sharp chest painwithout any other symptoms.   Discharge Diagnoses:  Principal Problem:   Sepsis secondary to UTI [facility]) Active Problems:   AKI (acute kidney injury) (HCC)   Hyperlipidemia   Hypertension   Type 2 diabetes mellitus (HCC)   Hydronephrosis, left   Anxiety   Atypical chest pain   Sepsis secondary to UTI, POA -Due to left hydronephrosis, had left ureteral stent placement with urology. Self-caths at baseline. -CT renal stone study with decreased hydronephrosis since the prior study -Blood culture negative to date -Urine culture with gram negative rod. Identification and sensitivity pending. I spoke with microbiology lab this morning. They state that sensitivity will not be available for another 2 days. I spoke with patient regarding this. His options were to stay until sensitivity was resulted so we can transition to oral antibiotics appropriately, or he could leave with a cephalosporin antibiotic, with close follow-up with primary care physician for final urine culture. He elected to leave today, with follow-up with his primary care physician. Primary care physician appointment was established prior to discharge. -Treated with rocephin with improvement, transitioned to keflex on discharge   AKI on CKD Stage III  -Baseline Cr 1.3  -Improving   Atypical chest pain -EKG with normal sinus, RBBB and left anterior fascicular block without ST changes  -Trop 0.04 --> 0.04 -Echocardiogram with EF 60-65%, Wall motion was normal; there were no regional wall motion abnormalities -Aspirin  -CP now resolved   Hyperlipidemia -Continue lipitor  HTN -Continue amlodipine, metoprolol. Holding lasix, HCTZ. Follow up with PCP on resuming these  medications.   DM type 2 -Hold  metformin in setting of AKI. Will hold other oral agents as well due to hypoglycemia.  -Need close follow up with PCP   Anxiety -Continue clonazepam    Discharge Instructions  Discharge Instructions    Call MD for:  difficulty breathing, headache or visual disturbances    Complete by:  As directed    Call MD for:  extreme fatigue    Complete by:  As directed    Call MD for:  hives    Complete by:  As directed    Call MD for:  persistant dizziness or light-headedness    Complete by:  As directed    Call MD for:  persistant nausea and vomiting    Complete by:  As directed    Call MD for:  severe uncontrolled pain    Complete by:  As directed    Call MD for:  temperature >100.4    Complete by:  As directed    Diet - low sodium heart healthy    Complete by:  As directed    Discharge instructions    Complete by:  As directed    You were cared for by a hospitalist during your hospital stay. If you have any questions about your discharge medications or the care you received while you were in the hospital after you are discharged, you can call the unit and asked to speak with the hospitalist on call if the hospitalist that took care of you is not available. Once you are discharged, your primary care physician will handle any further medical issues. Please note that NO REFILLS for any discharge medications will be authorized once you are discharged, as it is imperative that you return to your primary care physician (or establish a relationship with a primary care physician if you do not have one) for your aftercare needs so that they can reassess your need for medications and monitor your lab values.   Increase activity slowly    Complete by:  As directed      Allergies as of 08/10/2017      Reactions   Sulfa Antibiotics Other (See Comments)   "make my feet burn"      Medication List    STOP taking these medications   ciprofloxacin 500 MG tablet Commonly known as:  CIPRO    furosemide 40 MG tablet Commonly known as:  LASIX   glimepiride 2 MG tablet Commonly known as:  AMARYL   hydrochlorothiazide 25 MG tablet Commonly known as:  HYDRODIURIL   indomethacin 25 MG capsule Commonly known as:  INDOCIN   JARDIANCE 10 MG Tabs tablet Generic drug:  empagliflozin   metFORMIN 1000 MG (MOD) 24 hr tablet Commonly known as:  GLUMETZA   potassium chloride SA 20 MEQ tablet Commonly known as:  K-DUR,KLOR-CON   tolterodine 4 MG 24 hr capsule Commonly known as:  DETROL LA     TAKE these medications   acetaminophen 325 MG tablet Commonly known as:  TYLENOL Take 650 mg by mouth every 6 (six) hours as needed for moderate pain.   amLODipine 10 MG tablet Commonly known as:  NORVASC Take 10 mg by mouth daily.   aspirin 325 MG tablet Take 325 mg by mouth 2 (two) times daily.   atorvastatin 20 MG tablet Commonly known as:  LIPITOR Take 20 mg by mouth every evening.   cephALEXin 500 MG capsule Commonly known as:  KEFLEX Take 1 capsule (500 mg total)  by mouth 4 (four) times daily.   [name] Take 5,000 mg by mouth 2 (two) times daily.   clonazePAM 0.5 MG tablet Commonly known as:  KLONOPIN Take 0.5 mg by mouth 2 (two) times daily as needed for anxiety.   CoQ-10 100 MG Caps Take 1 capsule by mouth 2 (two) times daily.   Fish Oil 1200 MG Caps Take 2,400 mg by mouth 2 (two) times daily.   metoprolol tartrate 100 MG tablet Commonly known as:  LOPRESSOR Take 100 mg by mouth 2 (two) times daily.   tamsulosin 0.4 MG Caps capsule Commonly known as:  FLOMAX Take 0.8 mg by mouth daily after supper.      Follow-up Information    Darci Needle, MD. Schedule an appointment as soon as possible for a visit in 1 week(s).   Specialty:  Endocrinology Why:  Or establish with new PCP either in his office or with resource list provided by Case Management.  Contact information: [address] [place] [address] [phone]           Allergies  Allergen Reactions  . Sulfa Antibiotics Other (See Comments)    "make my feet burn"    Consultations:  None   Procedures/Studies: Dg Chest 2 View  Result Date: 08/08/2017 CLINICAL DATA:  Weakness and shortness of breath EXAM: CHEST  2 VIEW COMPARISON:  Report 06/16/2011 FINDINGS: The heart size and mediastinal contours are within normal limits. Both lungs are clear. Calcific tendinitis at the right shoulder IMPRESSION: No active cardiopulmonary disease. Electronically Signed   By: Jasmine Pang M.D.   On: 08/08/2017 19:39   Ct Renal Stone Study  Result Date: 08/08/2017 CLINICAL DATA:  Nausea and emesis EXAM: CT ABDOMEN AND PELVIS WITHOUT CONTRAST TECHNIQUE: Multidetector CT imaging of the abdomen and pelvis was performed following the standard protocol without IV contrast. COMPARISON:  05/05/2017, 03/28/2017 FINDINGS: Lower chest: Lung bases demonstrate no acute consolidation or pleural effusion. Normal heart size. Coronary artery calcification. Hepatobiliary: Multiple low-density lesions consistent with cysts. Largest is seen in the inferior right hepatic lobe and measures 5.9 cm. Dilated gallbladder up to 5 cm. Small calcified stone. No biliary dilatation. Pancreas: Unremarkable. No pancreatic ductal dilatation or surrounding inflammatory changes. Spleen: Normal in size without focal abnormality.  Splenic granuloma Adrenals/Urinary Tract: Adrenal glands are within normal limits. Negative for right hydronephrosis. Multiple hypodense masses within the bilateral kidneys, some of which are consistent with cysts, others are indeterminate. Interim placement of left-sided ureteral stent with decreased hydronephrosis since the prior study. Marked wall thickening of the urinary bladder. Stomach/Bowel: Stomach within normal limits. No dilated small bowel. No colon wall thickening. Status post appendectomy Vascular/Lymphatic: Aortic atherosclerosis. No enlarged abdominal or pelvic lymph  nodes. Reproductive: Enlarged prostate with calcification Other: Negative for free air or free fluid Musculoskeletal: Degenerative changes. Chronic appearing superior compression at T12. IMPRESSION: 1. Interval placement of left-sided ureteral stent with decreased hydronephrosis since the prior study. Negative for right hydronephrosis, kidney stone, or ureteral stone. There remains significant diffuse bladder wall thickening which may be secondary to cystitis, chronic obstruction, or neoplasm 2. Dilated gallbladder containing small calcified stone. No surrounding wall thickening or fluid, correlation with ultrasound as indicated 3. Multiple low-density masses within both kidneys, some are complex and in completely evaluated without contrast material. Further evaluation with MRI or renal CT could be obtained when clinically feasible 4. Hepatic cysts Electronically Signed   By: Jasmine Pang M.D.   On: 08/08/2017 23:13  US Abdomen Limited Ruq  Result Date: 08/09/2017 CLINICAL DATA:  Abnormal CT, liver cysts and gallstones, abdominal pain EXAM: ULTRASOUND ABDOMEN LIMITED RIGHT UPPER QUADRANT COMPARISON:  CT 08/08/2017 FINDINGS: Gallbladder: Dilated gallbladder containing sludge and stones. Normal wall thickness. Negative sonographic Murphy's. Common bile duct: Diameter: 4.1 mm Liver: Echogenic liver. Multiple cysts. Cyst in the left hepatic lobe measures 1.6 x 1.5 x 1.5 cm. Cyst in the right hepatic lobe measures 5.1 x 5.7 x 4.7 cm IMPRESSION: 1. Slightly dilated gallbladder containing sludge and stones. Negative for was wall thickening or sonographic Murphy. Negative for biliary dilatation 2. Echogenic slightly fatty liver containing multiple cysts Electronically Signed   By: Jasmine Pang M.D.   On: 08/09/2017 01:18    Echo Study Conclusions  - Left ventricle: The cavity size was normal. Wall thickness was   increased in a pattern of mild LVH. Systolic function was normal.   The estimated ejection  fraction was in the range of 60% to 65%.   Wall motion was normal; there were no regional wall motion   abnormalities. Doppler parameters are consistent with abnormal   left ventricular relaxation (grade 1 diastolic dysfunction). - Aortic valve: Trileaflet; moderately calcified leaflets.   Sclerosis without stenosis. There was trivial regurgitation. - Mitral valve: Mildly calcified annulus. There was no significant   regurgitation. - Right ventricle: The cavity size was normal. Systolic function   was normal. - Pulmonary arteries: PA peak pressure: 29 mm Hg (S). - Inferior vena cava: The vessel was normal in size. The   respirophasic diameter changes were in the normal range (= 50%),   consistent with normal central venous pressure.  Impressions:  - Normal LV size with mild LV hypertrophy. EF 60-65%. Aortic   sclerosis without significant stenosis. Normal RV size and    systolic function.    Discharge Exam: Vitals:   08/10/17 0522 08/10/17 1000  BP: (!) 108/53 (!) 110/52  Pulse: 87 84  Resp: 20   Temp: 98.2 F (36.8 C)   SpO2: 95%    Vitals:   08/09/17 1324 08/09/17 2103 08/10/17 0522 08/10/17 1000  BP: 122/65 130/63 (!) 108/53 (!) 110/52  Pulse: 91 (!) 104 87 84  Resp: 20 19 20    Temp: 100 F (37.8 C) 98.5 F (36.9 C) 98.2 F (36.8 C)   TempSrc: Oral Oral Oral   SpO2: 95% 94% 95%   Weight:      Height:        General: Pt is alert, awake, not in acute distress Cardiovascular: RRR, S1/S2 +, no rubs, no gallops Respiratory: CTA bilaterally, no wheezing, no rhonchi Abdominal: Soft, NT, ND, bowel sounds + Extremities: no edema, no cyanosis    The results of significant diagnostics from this hospitalization (including imaging, microbiology, ancillary and laboratory) are listed below for reference.     Microbiology: Recent Results (from the past 240 hour(s))  Urine culture     Status: Abnormal (Preliminary result)   Collection Time: 08/08/17  9:23 PM  Result  Value Ref Range Status   Specimen Description URINE, RANDOM  Final   Special Requests NONE  Final   Culture >=100,000 COLONIES/mL GRAM NEGATIVE RODS (A)  Final   Report Status PENDING  Incomplete  Culture, blood (routine x 2)     Status: None (Preliminary result)   Collection Time: 08/08/17 10:12 PM  Result Value Ref Range Status   Specimen Description BLOOD BLOOD LEFT FOREARM  Final   Special Requests   Final  BOTTLES DRAWN AEROBIC AND ANAEROBIC Blood Culture adequate volume   Culture   Final    NO GROWTH 1 DAY Performed at [facility], [address] [place], [place] [address]    Report Status PENDING  Incomplete  Culture, blood (routine x 2)     Status: None (Preliminary result)   Collection Time: 08/08/17 10:17 PM  Result Value Ref Range Status   Specimen Description BLOOD BLOOD LEFT HAND  Final   Special Requests   Final    BOTTLES DRAWN AEROBIC AND ANAEROBIC Blood Culture adequate volume   Culture   Final    NO GROWTH 1 DAY Performed at [facility], [address], [place], [place] [address]    Report Status PENDING  Incomplete     Labs: BNP (last 3 results)  Recent Labs  08/08/17 2035  BNP 58.6   Basic Metabolic Panel:  Recent Labs Lab 08/08/17 1829 08/09/17 0437 08/10/17 0451  NA 137 138 138  K 4.6 4.2 3.7  CL 105 109 110  CO2 18* 19* 17*  GLUCOSE 204* 171* 60*  BUN 76* 69* 44*  CREATININE 2.84* 2.29* 1.65*  CALCIUM 9.6 8.2* 7.9*  MG 2.1  --   --    Liver Function Tests:  Recent Labs Lab 08/08/17 1829  AST 16  ALT 19  ALKPHOS 52  BILITOT 0.7  PROT 8.6*  ALBUMIN 3.4*    Recent Labs Lab 08/08/17 1829  LIPASE 36   No results for input(s): AMMONIA in the last 168 hours. CBC:  Recent Labs Lab 08/08/17 1829 08/09/17 0437 08/10/17 0451  WBC 15.7* 12.4* 9.6  NEUTROABS  --  9.7* 7.1  HGB 13.8 10.7* 10.2*  HCT 40.3 32.4* 30.9*  MCV 92.6 94.2 92.5  PLT 143* 134* 124*   Cardiac Enzymes:  Recent Labs Lab  08/09/17 0437  TROPONINI 0.04*   BNP: Invalid input(s): POCBNP CBG:  Recent Labs Lab 08/09/17 2152 08/09/17 2225 08/10/17 0707 08/10/17 0743 08/10/17 1158  GLUCAP 69 72 66 109* 92   D-Dimer No results for input(s): DDIMER in the last 72 hours. Hgb A1c No results for input(s): HGBA1C in the last 72 hours. Lipid Profile No results for input(s): CHOL, HDL, LDLCALC, TRIG, CHOLHDL, LDLDIRECT in the last 72 hours. Thyroid function studies No results for input(s): TSH, T4TOTAL, T3FREE, THYROIDAB in the last 72 hours.  Invalid input(s): FREET3 Anemia work up No results for input(s): VITAMINB12, FOLATE, FERRITIN, TIBC, IRON, RETICCTPCT in the last 72 hours. Urinalysis    Component Value Date/Time   COLORURINE YELLOW 08/08/2017 2123   APPEARANCEUR CLOUDY (A) 08/08/2017 2123   LABSPEC 1.015 08/08/2017 2123   PHURINE 5.0 08/08/2017 2123   GLUCOSEU 50 (A) 08/08/2017 2123   HGBUR MODERATE (A) 08/08/2017 2123   BILIRUBINUR NEGATIVE 08/08/2017 2123   BILIRUBINUR negative 12/12/2016 1224   KETONESUR 5 (A) 08/08/2017 2123   PROTEINUR 100 (A) 08/08/2017 2123   UROBILINOGEN 0.2 12/12/2016 1224   NITRITE NEGATIVE 08/08/2017 2123   LEUKOCYTESUR LARGE (A) 08/08/2017 2123   Sepsis Labs Invalid input(s): PROCALCITONIN,  WBC,  LACTICIDVEN Microbiology Recent Results (from the past 240 hour(s))  Urine culture     Status: Abnormal (Preliminary result)   Collection Time: 08/08/17  9:23 PM  Result Value Ref Range Status   Specimen Description URINE, RANDOM  Final   Special Requests NONE  Final   Culture >=100,000 COLONIES/mL GRAM NEGATIVE RODS (A)  Final   Report Status PENDING  Incomplete  Culture, blood (routine x 2)     Status: None (Preliminary result)   Collection Time: 08/08/17 10:12 PM  Result Value Ref Range Status   Specimen Description BLOOD BLOOD LEFT FOREARM  Final   Special Requests   Final    BOTTLES DRAWN AEROBIC AND ANAEROBIC Blood Culture adequate volume   Culture    Final    NO GROWTH 1 DAY Performed at [facility], [address] [place], [place] [address]    Report Status PENDING  Incomplete  Culture, blood (routine x 2)     Status: None (Preliminary result)   Collection Time: 08/08/17 10:17 PM  Result Value Ref Range Status   Specimen Description BLOOD BLOOD LEFT HAND  Final   Special Requests   Final    BOTTLES DRAWN AEROBIC AND ANAEROBIC Blood Culture adequate volume   Culture   Final    NO GROWTH 1 DAY Performed at [facility], [address] [place], [place] [address]    Report Status PENDING  Incomplete     Time coordinating discharge: 40 minutes  SIGNED:  Noralee Stain, DO [facility] Pager [phone]  If 7PM-7AM, please contact night-coverage [email or web address] Password [email or web address] 08/10/2017, 2:30 PM

## 2017-08-10 NOTE — Patient Outreach (Signed)
HTA THN Screening call #3 unsuccessful. I left my last message and advised I will be sending information about our services.  Zara Council. Burgess Estelle, MSN, [facility] Gerontological Nurse Practitioner [facility] Care Management [phone]

## 2017-08-10 NOTE — Patient Outreach (Signed)
Mr. Schiavo called me today and informed me he has been in the hospital for 3 days and that is why he didn't call me back or answer the phone for his HTA Screening call.  He is appropriate coincidentally now for Transition of care calls and would be complex as he is a diabetic and has educational needs.   He was very interested in our program and I think will engage fully with our staff.  I have completed his first transition of care call and he will be due for #2 next week.  He will see his new primary care MD, Dr. [name], at [facility] on Monday.  He will see Dr. Sharl Ma in October. He doesn't know what his Hgb A1C is or what his target should be. He has never taken a formal diabetes class.  His Acute Kidney Injury may or may not have been due to his diabetes but I note that his creatinine is 2.2. So he needs education on end organ complications of diabetes. He does self cath.  He is married and he is independent.  I have also referred to pharmacy as he has questions about his meds with regards to his kidney function.  Zara Council. Burgess Estelle, MSN, [facility] Gerontological Nurse Practitioner [facility] Care Management [phone]

## 2017-08-10 NOTE — Progress Notes (Signed)
Pt and Pt's wife given discharge teaching including medications and all other discharge instructions. Pt and Pt's Wife verbalized understanding and discharge packet with Pt at time of discharge. VSS and acute changes noted in Pt's assessment at this time.

## 2017-08-10 NOTE — Care Management Note (Signed)
Case Management Note  Patient Details  Name: Jerry York MRN: [id number] Date of Birth: [birth year]  Subjective/Objective: PT-recc otpt PT, provided patient w/otpt [facility] listing as resource.Nurse to provide attending w/manual script for ordering otpt PT.No pcp.Provided patient w/pcp [facility] listing as resource.Patient will make own appt. No further CM needs.                     Action/Plan:d/c home otpt PT.   Expected Discharge Date:                  Expected Discharge Plan:  OP Rehab  In-House Referral:     Discharge planning Services  CM Consult, Other - See comment (New pcp resource list)  Post Acute Care Choice:    Choice offered to:     DME Arranged:    DME Agency:     HH Arranged:    HH Agency:     Status of Service:  Completed, signed off  If discussed at Long Length of Stay Meetings, dates discussed:    Additional Comments:  Lanier Clam, RN 08/10/2017, 10:24 AM

## 2017-08-11 DIAGNOSIS — R339 Retention of urine, unspecified: Secondary | ICD-10-CM | POA: Diagnosis not present

## 2017-08-12 ENCOUNTER — Encounter (HOSPITAL_COMMUNITY): Payer: Self-pay | Admitting: Emergency Medicine

## 2017-08-12 ENCOUNTER — Ambulatory Visit (HOSPITAL_COMMUNITY)
Admission: EM | Admit: 2017-08-12 | Discharge: 2017-08-12 | Disposition: A | Discharge status: Home / Self Care | Payer: PPO | Attending: Family | Admitting: Family

## 2017-08-12 ENCOUNTER — Emergency Department (HOSPITAL_COMMUNITY)
Admission: EM | Admit: 2017-08-12 | Discharge: 2017-08-13 | Disposition: A | Discharge status: Home / Self Care | Payer: PPO | Attending: Emergency Medicine | Admitting: Emergency Medicine

## 2017-08-12 DIAGNOSIS — Z7984 Long term (current) use of oral hypoglycemic drugs: Secondary | ICD-10-CM | POA: Insufficient documentation

## 2017-08-12 DIAGNOSIS — N390 Urinary tract infection, site not specified: Secondary | ICD-10-CM | POA: Diagnosis not present

## 2017-08-12 DIAGNOSIS — Z960 Presence of urogenital implants: Secondary | ICD-10-CM | POA: Diagnosis not present

## 2017-08-12 DIAGNOSIS — I10 Essential (primary) hypertension: Secondary | ICD-10-CM | POA: Diagnosis not present

## 2017-08-12 DIAGNOSIS — M109 Gout, unspecified: Secondary | ICD-10-CM | POA: Diagnosis not present

## 2017-08-12 DIAGNOSIS — R000 Tachycardia, unspecified: Secondary | ICD-10-CM | POA: Diagnosis not present

## 2017-08-12 DIAGNOSIS — R6883 Chills (without fever): Secondary | ICD-10-CM | POA: Insufficient documentation

## 2017-08-12 DIAGNOSIS — Z79899 Other long term (current) drug therapy: Secondary | ICD-10-CM | POA: Diagnosis not present

## 2017-08-12 DIAGNOSIS — E119 Type 2 diabetes mellitus without complications: Secondary | ICD-10-CM | POA: Insufficient documentation

## 2017-08-12 DIAGNOSIS — M79674 Pain in right toe(s): Secondary | ICD-10-CM | POA: Diagnosis present

## 2017-08-12 DIAGNOSIS — R9431 Abnormal electrocardiogram [ECG] [EKG]: Secondary | ICD-10-CM | POA: Diagnosis not present

## 2017-08-12 LAB — COMPREHENSIVE METABOLIC PANEL
ALBUMIN: 2.6 g/dL — AB (ref 3.5–5.0)
ALT: 51 U/L (ref 17–63)
ANION GAP: 16 — AB (ref 5–15)
AST: 30 U/L (ref 15–41)
Alkaline Phosphatase: 76 U/L (ref 38–126)
BUN: 53 mg/dL — ABNORMAL HIGH (ref 6–20)
CO2: 16 mmol/L — AB (ref 22–32)
Calcium: 9.3 mg/dL (ref 8.9–10.3)
Chloride: 105 mmol/L (ref 101–111)
Creatinine, Ser: 1.92 mg/dL — ABNORMAL HIGH (ref 0.61–1.24)
GFR calc Af Amer: 41 mL/min — ABNORMAL LOW (ref >60)
GFR calc non Af Amer: 35 mL/min — ABNORMAL LOW (ref >60)
GLUCOSE: 189 mg/dL — AB (ref 65–99)
POTASSIUM: 3.8 mmol/L (ref 3.5–5.1)
SODIUM: 137 mmol/L (ref 135–145)
Total Bilirubin: 0.7 mg/dL (ref 0.3–1.2)
Total Protein: 7.9 g/dL (ref 6.5–8.1)

## 2017-08-12 LAB — CBC WITH DIFFERENTIAL/PLATELET
Basophils Absolute: 0 10*3/uL (ref 0.0–0.1)
Basophils Relative: 0 %
Eosinophils Absolute: 0 10*3/uL (ref 0.0–0.7)
Eosinophils Relative: 0 %
HCT: 35.9 % — ABNORMAL LOW (ref 39.0–52.0)
Hemoglobin: 11.8 g/dL — ABNORMAL LOW (ref 13.0–17.0)
LYMPHS ABS: 1.4 10*3/uL (ref 0.7–4.0)
Lymphocytes Relative: 12 %
MCH: 30.6 pg (ref 26.0–34.0)
MCHC: 32.9 g/dL (ref 30.0–36.0)
MCV: 93.2 fL (ref 78.0–100.0)
MONOS PCT: 4 %
Monocytes Absolute: 0.5 10*3/uL (ref 0.1–1.0)
NEUTROS ABS: 9.7 10*3/uL — AB (ref 1.7–7.7)
NEUTROS PCT: 84 %
Platelets: 183 10*3/uL (ref 150–400)
RBC: 3.85 MIL/uL — ABNORMAL LOW (ref 4.22–5.81)
RDW: 16.6 % — ABNORMAL HIGH (ref 11.5–15.5)
WBC: 11.7 10*3/uL — AB (ref 4.0–10.5)

## 2017-08-12 LAB — URINE CULTURE: Culture: >=100000 — AB

## 2017-08-12 LAB — CBG MONITORING, ED: GLUCOSE-CAPILLARY: 163 mg/dL — AB (ref 65–99)

## 2017-08-12 LAB — I-STAT CG4 LACTIC ACID, ED: Lactic Acid, Venous: 1.42 mmol/L (ref 0.5–1.9)

## 2017-08-12 LAB — PROTIME-INR
INR: 1.17
PROTHROMBIN TIME: 15 s (ref 11.4–15.2)

## 2017-08-12 NOTE — ED Provider Notes (Signed)
[facility]    CSN: [id number] Arrival date & time: 08/12/17  1627     History   Chief Complaint Chief Complaint  Patient presents with  . Foot Pain    HPI Jerry York is a 66 y.o. male.   Patient complains of right foot and toes pain for past 3 days, worsened. 'overall doesn't feel well.'   No injury to foot. Taking  tyleonol for pain with some relief. Tried epsom salt with little relief. Endorses chills 2 days ' gets these all the time.' better after being treated in hospitalz  H/o gout- not on ppx. Last episode treated with indomethacin with resolve  Still on keflex. No hematuria, urine isn't cloudy ( had been prior to hospitlizations), flank pain, N, v, fever.   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 History of self cathing. Recently admitted to [facility] 8/14 for sepsis secondary to UTI. Discharged on Keflex  ( UTI shows resistant) . Acute kidney injury while in the hospital baseline creatinine 1.3. Atypical chest pain which resolved during hospitalization.  CrtCL 47.  Past Medical History:  Diagnosis Date  . Anxiety   . Bilateral renal cysts   . Foley catheter in place   . Gout    02-22-2017 acute right foot gout---  per pt resolved  . Gross hematuria   . Heart murmur   . Hydronephrosis, left   . Hyperlipidemia   . Hypertension   . Renal insufficiency   . Type 2 diabetes mellitus (HCC)   . Urinary retention   . Wears glasses     Patient Active Problem List   Diagnosis Date Noted  . Type 2 diabetes mellitus (HCC) 08/09/2017  . Gout 08/09/2017  . Hydronephrosis, left 08/09/2017  . Anxiety 08/09/2017  . Atypical chest pain 08/09/2017  . Sepsis secondary to UTI (HCC) 08/08/2017  . AKI (acute kidney injury) (HCC) 08/08/2017  . Hyperlipidemia 08/08/2017  . Hypertension 08/08/2017    Past Surgical History:    Procedure Laterality Date  . APPENDECTOMY  age 65  . CYSTOSCOPY WITH URETEROSCOPY AND STENT PLACEMENT Left 05/08/2017   Procedure: URETEROSCOPY AND STENT PLACEMENT;  Surgeon: Ihor Gully, MD;  Location: [facility];  Service: Urology;  Laterality: Left;  . CYSTOSCOPY/RETROGRADE/URETEROSCOPY Bilateral 05/08/2017   Procedure: CYSTOSCOPY/ BILATERAL RETROGRADE;  Surgeon: Ihor Gully, MD;  Location: [facility];  Service: Urology;  Laterality: Bilateral;  . INGUINAL HERNIA REPAIR Right 10/10/2000  . KNEE ARTHROSCOPY Right 1980's  . SHOULDER ARTHROSCOPY WITH OPEN ROTATOR CUFF REPAIR Right 1990's       Home Medications    Prior to Admission medications   Medication Sig Start Date End Date Taking? Authorizing Provider  aspirin 325 MG tablet Take 325 mg by mouth 2 (two) times daily.    Yes [provider]  atorvastatin (LIPITOR) 20 MG tablet Take 20 mg by mouth every evening.    Yes [provider]  cephALEXin (KEFLEX) 500 MG capsule Take 1 capsule (500 mg total) by mouth 4 (four) times daily. 08/10/17 08/20/17 Yes Noralee Stain Chahn-Yang, DO  [name] Take 5,000 mg by mouth 2 (two) times daily.    Yes [provider]  clonazePAM (KLONOPIN) 0.5 MG tablet Take 0.5 mg by mouth 2 (two) times daily as needed for anxiety.   Yes [provider]  Coenzyme Q10 (COQ-10)  100 MG CAPS Take 1 capsule by mouth 2 (two) times daily.   Yes [provider]  empagliflozin (JARDIANCE) 10 MG TABS tablet Take 10 mg by mouth daily.   Yes [provider]  glimepiride (AMARYL) 2 MG tablet Take 2 mg by mouth daily with breakfast.   Yes [provider]  metFORMIN (GLUCOPHAGE) 1000 MG tablet Take 1,000 mg by mouth 2 (two) times daily with a meal.   Yes [provider]  metoprolol (LOPRESSOR) 100 MG tablet Take 100 mg by mouth 2 (two) times daily.   Yes [provider]  Omega-3 Fatty Acids (FISH OIL) 1200 MG  CAPS Take 2,400 mg by mouth 2 (two) times daily.   Yes [provider]  tamsulosin (FLOMAX) 0.4 MG CAPS capsule Take 0.8 mg by mouth daily after supper.   Yes [provider]  acetaminophen (TYLENOL) 325 MG tablet Take 650 mg by mouth every 6 (six) hours as needed for moderate pain.    [provider]  amLODipine (NORVASC) 10 MG tablet Take 10 mg by mouth daily.     [provider]    Family History Family History  Problem Relation Age of Onset  . Congestive Heart Failure Mother   . Hypertension Brother   . Hypertension Daughter   . Hypertension Son     Social History Social History  Substance Use Topics  . Smoking status: Never Smoker  . Smokeless tobacco: Never Used  . Alcohol use No     Allergies   Sulfa antibiotics   Review of Systems Review of Systems  Constitutional: Negative for chills and fever.  Respiratory: Negative for cough.   Cardiovascular: Negative for chest pain and palpitations.  Gastrointestinal: Negative for nausea and vomiting.  Genitourinary: Negative for dysuria and flank pain.  Musculoskeletal: Positive for joint swelling.     Physical Exam Triage Vital Signs ED Triage Vitals  Enc Vitals Group     BP 08/12/17 1754 110/71     Pulse Rate 08/12/17 1754 (!) 110     Resp 08/12/17 1754 18     Temp 08/12/17 1754 98.3 F (36.8 C)     Temp Source 08/12/17 1754 Oral     SpO2 08/12/17 1754 95 %     Weight --      Height --      Head Circumference --      Peak Flow --      Pain Score 08/12/17 1756 10     Pain Loc --      Pain Edu? --      Excl. in GC? --    No data found.   Updated Vital Signs BP 110/71 (BP Location: Right Arm)   Pulse (!) 110   Temp 98.3 F (36.8 C) (Oral)   Resp 18   SpO2 95%   Visual Acuity Right Eye Distance:   Left Eye Distance:   Bilateral Distance:    Right Eye Near:   Left Eye Near:    Bilateral Near:     Physical Exam  Constitutional: He appears well-developed and  well-nourished.  Cardiovascular: Regular rhythm and normal heart sounds.   Pulmonary/Chest: Effort normal and breath sounds normal. No respiratory distress. He has no wheezes. He has no rhonchi. He has no rales.  Musculoskeletal:       Right foot: There is tenderness and bony tenderness. There is normal range of motion and no swelling.       Feet:  Exquisite tenderness,  nonpitting edema, erythema noted right great toe. No purulent discharge, laceration, rash.  Lymphadenopathy:       Head (left side): No submandibular and no preauricular adenopathy present.  Neurological: He is alert.  Sensation intact bilateral lower extremities  Skin: Skin is warm and dry.  Psychiatric: He has a normal mood and affect. His speech is normal and behavior is normal.  Vitals reviewed.    UC Treatments / Results  Labs (all labs ordered are listed, but only abnormal results are displayed) Labs Reviewed - No data to display  EKG  EKG Interpretation None       Radiology No results found.  Procedures Procedures (including critical care time)  Medications Ordered in UC Medications - No data to display   Initial Impression / Assessment and Plan / UC Course  I have reviewed the triage vital signs and the nursing notes.  Pertinent labs & imaging results that were available during my care of the patient were reviewed by me and considered in my medical decision making (see chart for details).       Final Clinical Impressions(s) / UC Diagnoses   Final diagnoses:  Urinary tract infection without hematuria, site unspecified  Acute gout involving toe of right foot, unspecified cause  Patient is well-appearing tonight however he is mildly tachycardic. He has no fever however overall he reports he describes he "not feel well". Wife is obviously concern examined tonight. Long discussion with patient regarding recent recent discharge and now he's been on antibiotic which is not susceptible to organism  seen in urine culture. Advised tonight based on their concern and mine that it is better for patient to be seen in emergency room as he may benefit from IV antibiotics and close observation. Patient was very agreeable with this plan and stated he would immediately go to [facility] emergency room and wife would drive him there.    CrtCl 47  New Prescriptions Discharge Medication List as of 08/12/2017  7:25 PM       Controlled Substance Prescriptions [place] Controlled Substance Registry consulted? Not Applicable   Allegra Grana, FNP 08/12/17 2014

## 2017-08-12 NOTE — Discharge Instructions (Signed)
As discussed, based on your not feeling well and recent urosepsis, we agreed safest if you to to emergency.   Please go immediately to [facility] emergency room.

## 2017-08-12 NOTE — ED Triage Notes (Signed)
Pt presents to ER from home with R foot pain; pt states he was discharged from here 3 days ago with UTI and AKI; was given ABX Keflex and told he could not take this d/t kidney issues; redness, swelling, warmth noted to R foot; pt reports pain is unbearable and cannot walk on it

## 2017-08-12 NOTE — ED Notes (Signed)
MD states okay for patient to have something to drink per patient request. Provided patient with water and blanket.

## 2017-08-12 NOTE — ED Notes (Signed)
Report given to Brook, RN

## 2017-08-12 NOTE — ED Triage Notes (Signed)
Pt c/o constant pain on right foot/toes onset 3 days...  Sts he was just recently d/c from [facility] for a UTI  Pain increases w/activity  Denies fevers  A&O x4... NAD... Brought back on wheel chair.

## 2017-08-13 LAB — URINALYSIS, ROUTINE W REFLEX MICROSCOPIC
Bilirubin Urine: NEGATIVE
Glucose, UA: >=500 mg/dL — AB
Ketones, ur: 5 mg/dL — AB
Nitrite: NEGATIVE
Protein, ur: 30 mg/dL — AB
Specific Gravity, Urine: 1.01 (ref 1.005–1.030)
Squamous Epithelial / HPF: NONE SEEN
pH: 5 (ref 5.0–8.0)

## 2017-08-13 LAB — I-STAT CG4 LACTIC ACID, ED: Lactic Acid, Venous: 0.67 mmol/L (ref 0.5–1.9)

## 2017-08-13 MED ORDER — OXYCODONE-ACETAMINOPHEN 5-325 MG PO TABS: 1.0000 | ORAL_TABLET | Freq: Three times a day (TID) | ORAL | 0 refills | Status: DC | PRN

## 2017-08-13 MED ORDER — OXYCODONE-ACETAMINOPHEN 5-325 MG PO TABS
1.0000 | ORAL_TABLET | Freq: Once | ORAL | Status: AC
  Administered 2017-08-13: 1 via ORAL
  Filled 2017-08-13: qty 1

## 2017-08-13 MED ORDER — LEVOFLOXACIN 500 MG PO TABS: 500.0000 mg | ORAL_TABLET | ORAL | 0 refills | Status: DC

## 2017-08-13 MED ORDER — LEVOFLOXACIN IN D5W 500 MG/100ML IV SOLN
500.0000 mg | Freq: Once | INTRAVENOUS | Status: AC
  Administered 2017-08-13: 500 mg via INTRAVENOUS
  Filled 2017-08-13: qty 100

## 2017-08-13 MED ORDER — MORPHINE SULFATE (PF) 4 MG/ML IV SOLN
4.0000 mg | Freq: Once | INTRAVENOUS | Status: AC
  Administered 2017-08-13: 4 mg via INTRAVENOUS
  Filled 2017-08-13: qty 1

## 2017-08-13 MED ORDER — SODIUM CHLORIDE 0.9 % IV BOLUS (SEPSIS)
1000.0000 mL | Freq: Once | INTRAVENOUS | Status: AC
  Administered 2017-08-13: 1000 mL via INTRAVENOUS

## 2017-08-13 NOTE — ED Notes (Signed)
Provider notified of patients request for additional pain medications and water. Water was given to patient per provider

## 2017-08-13 NOTE — Discharge Instructions (Signed)
It was my pleasure taking care of you today!   Your urine culture was reviewed with the antibiotic that you are on (Keflex) was not working very well, therefore, we have switched you to a new antibiotic Levaquin. You were given your first dose in the ER. Begin taking this medication as direction starting Monday morning. Take pain medication as needed for severe pain - This can make you very drowsy - please do not drink alcohol, operate heavy machinery or drive on this medication. Please keep your scheduled appointment with her primary care doctor on Monday. Return to ER for fever, worsening redness to the foot, new or worsening symptoms, any additional concerns.

## 2017-08-13 NOTE — ED Provider Notes (Signed)
[facility]   CSN: [id number] Arrival date & time: 08/12/17  1947     History   Chief Complaint Chief Complaint  Patient presents with  . Foot Pain  . Tachycardia    HPI MELBURN TREIBER is a 66 y.o. male.  The history is provided by the patient and medical records. No language interpreter was used.  Foot Pain    JAZIAH GOELLER is a 66 y.o. male  with a recent admission on 8/15 for complicated UTI, DM, gout who presents to the Emergency Department complaining of acute onset of right foot pain x 2 days which feels similar to previous gout flares. Associated with redness to toe joint. Taking tylenol with little improvement in pain. Also tried epsom salt with little relief. Patient states in the past he was given prescription for indomethacin which improved symptoms. Patient admitted on 8/15 for complicated UTI. He was subsequently discharged on 8/16. Culture report was not available upon discharge. He was sent home on Keflex. Patient states that he has not had any fevers since discharge, however is starting to feel weak and intermittently have chills. Urine no longer seems cloudy and he felt like urine was improving. He went to urgent care prior to arrival who noticed that culture had returned and was not sensitive to Keflex. Encouraged patient to go to emergency department for further evaluation.    Past Medical History:  Diagnosis Date  . Anxiety   . Bilateral renal cysts   . Foley catheter in place   . Gout    02-22-2017 acute right foot gout---  per pt resolved  . Gross hematuria   . Heart murmur   . Hydronephrosis, left   . Hyperlipidemia   . Hypertension   . Renal insufficiency   . Type 2 diabetes mellitus (HCC)   . Urinary retention   . Wears glasses     Patient Active Problem List   Diagnosis Date Noted  . Type 2 diabetes mellitus (HCC) 08/09/2017  . Gout 08/09/2017  . Hydronephrosis, left 08/09/2017  . Anxiety 08/09/2017  . Atypical  chest pain 08/09/2017  . Sepsis secondary to UTI (HCC) 08/08/2017  . AKI (acute kidney injury) (HCC) 08/08/2017  . Hyperlipidemia 08/08/2017  . Hypertension 08/08/2017    Past Surgical History:  Procedure Laterality Date  . APPENDECTOMY  age 25  . CYSTOSCOPY WITH URETEROSCOPY AND STENT PLACEMENT Left 05/08/2017   Procedure: URETEROSCOPY AND STENT PLACEMENT;  Surgeon: Ihor Gully, MD;  Location: [facility];  Service: Urology;  Laterality: Left;  . CYSTOSCOPY/RETROGRADE/URETEROSCOPY Bilateral 05/08/2017   Procedure: CYSTOSCOPY/ BILATERAL RETROGRADE;  Surgeon: Ihor Gully, MD;  Location: [facility];  Service: Urology;  Laterality: Bilateral;  . INGUINAL HERNIA REPAIR Right 10/10/2000  . KNEE ARTHROSCOPY Right 1980's  . SHOULDER ARTHROSCOPY WITH OPEN ROTATOR CUFF REPAIR Right 1990's       Home Medications    Prior to Admission medications   Medication Sig Start Date End Date Taking? Authorizing Provider  acetaminophen (TYLENOL) 325 MG tablet Take 650 mg by mouth every 6 (six) hours as needed for moderate pain.    [provider]  amLODipine (NORVASC) 10 MG tablet Take 10 mg by mouth daily.     [provider]  aspirin 325 MG tablet Take 325 mg by mouth 2 (two) times daily.     [provider]  atorvastatin (LIPITOR) 20 MG tablet Take 20 mg by mouth every evening.  [provider]  cephALEXin (KEFLEX) 500 MG capsule Take 1 capsule (500 mg total) by mouth 4 (four) times daily. 08/10/17 08/20/17  Noralee Stain Chahn-Yang, DO  [name] Take 5,000 mg by mouth 2 (two) times daily.     [provider]  clonazePAM (KLONOPIN) 0.5 MG tablet Take 0.5 mg by mouth 2 (two) times daily as needed for anxiety.    [provider]  Coenzyme Q10 (COQ-10) 100 MG CAPS Take 1 capsule by mouth 2 (two) times daily.    [provider]  empagliflozin (JARDIANCE) 10 MG TABS tablet Take 10 mg by mouth daily.     [provider]  glimepiride (AMARYL) 2 MG tablet Take 2 mg by mouth daily with breakfast.    [provider]  levofloxacin (LEVAQUIN) 500 MG tablet Take 1 tablet (500 mg total) by mouth every other day. Starting Monday am 8/20 08/13/17   Lethia Donlon, Chase Picket, PA-C  metFORMIN (GLUCOPHAGE) 1000 MG tablet Take 1,000 mg by mouth 2 (two) times daily with a meal.    [provider]  metoprolol (LOPRESSOR) 100 MG tablet Take 100 mg by mouth 2 (two) times daily.    [provider]  Omega-3 Fatty Acids (FISH OIL) 1200 MG CAPS Take 2,400 mg by mouth 2 (two) times daily.    [provider]  oxyCODONE-acetaminophen (PERCOCET/ROXICET) 5-325 MG tablet Take 1 tablet by mouth every 8 (eight) hours as needed for severe pain. 08/13/17   Oshen Wlodarczyk, Chase Picket, PA-C  tamsulosin (FLOMAX) 0.4 MG CAPS capsule Take 0.8 mg by mouth daily after supper.    [provider]    Family History Family History  Problem Relation Age of Onset  . Congestive Heart Failure Mother   . Hypertension Brother   . Hypertension Daughter   . Hypertension Son     Social History Social History  Substance Use Topics  . Smoking status: Never Smoker  . Smokeless tobacco: Never Used  . Alcohol use No     Allergies   Sulfa antibiotics   Review of Systems Review of Systems  Constitutional: Positive for chills. Negative for fever.  Musculoskeletal: Positive for arthralgias.  Skin: Positive for color change.  All other systems reviewed and are negative.    Physical Exam Updated Vital Signs BP 116/61   Pulse (!) 102   Temp 98.3 F (36.8 C) (Oral)   Resp (!) 28   Wt 74.8 kg (165 lb)   SpO2 95%   BMI 26.63 kg/m   Physical Exam  Constitutional: He is oriented to person, place, and time. He appears well-developed and well-nourished. No distress.  HENT:  Head: Normocephalic and atraumatic.  Cardiovascular: Normal rate, regular rhythm and normal heart sounds.   No murmur  heard. Pulmonary/Chest: Effort normal and breath sounds normal. No respiratory distress.  Abdominal: Soft. He exhibits no distension. There is no tenderness.  Musculoskeletal:  Tenderness and warmth to right MTP joint. + erythema. No streaking.  Neurological: He is alert and oriented to person, place, and time.  Bilateral lower extremities neurovascularly intact.  Skin: Skin is warm and dry.  Nursing note and vitals reviewed.    ED Treatments / Results  Labs (all labs ordered are listed, but only abnormal results are displayed) Labs Reviewed  COMPREHENSIVE METABOLIC PANEL - Abnormal; Notable for the following:       Result Value   CO2 16 (*)    Glucose, Bld 189 (*)    BUN 53 (*)  Creatinine, Ser 1.92 (*)    Albumin 2.6 (*)    GFR calc non Af Amer 35 (*)    GFR calc Af Amer 41 (*)    Anion gap 16 (*)    All other components within normal limits  CBC WITH DIFFERENTIAL/PLATELET - Abnormal; Notable for the following:    WBC 11.7 (*)    RBC 3.85 (*)    Hemoglobin 11.8 (*)    HCT 35.9 (*)    RDW 16.6 (*)    Neutro Abs 9.7 (*)    All other components within normal limits  URINALYSIS, ROUTINE W REFLEX MICROSCOPIC - Abnormal; Notable for the following:    Glucose, UA >=500 (*)    Hgb urine dipstick SMALL (*)    Ketones, ur 5 (*)    Protein, ur 30 (*)    Leukocytes, UA MODERATE (*)    Bacteria, UA RARE (*)    All other components within normal limits  CBG MONITORING, ED - Abnormal; Notable for the following:    Glucose-Capillary 163 (*)    All other components within normal limits  CULTURE, BLOOD (ROUTINE X 2)  CULTURE, BLOOD (ROUTINE X 2)  URINE CULTURE  PROTIME-INR  I-STAT CG4 LACTIC ACID, ED  I-STAT CG4 LACTIC ACID, ED    EKG  EKG Interpretation None       Radiology No results found.  Procedures Procedures (including critical care time)  Medications Ordered in ED Medications  sodium chloride 0.9 % bolus 1,000 mL (0 mLs Intravenous Stopped 08/13/17 0229)    levofloxacin (LEVAQUIN) IVPB 500 mg (0 mg Intravenous Stopped 08/13/17 0228)  morphine 4 MG/ML injection 4 mg (4 mg Intravenous Given 08/13/17 0127)     Initial Impression / Assessment and Plan / ED Course  I have reviewed the triage vital signs and the nursing notes.  Pertinent labs & imaging results that were available during my care of the patient were reviewed by me and considered in my medical decision making (see chart for details).    AVI KERSCHNER is a 66 y.o. male who presents to ED for right foot pain c/w gout. Warmth, erythema and tenderness to right MTP. Hx of gout and this feels similar. Doubt septic joint. Patient also recently admitted for UTI on 8/15. Symptoms improved upon discharge. He feels as if urine is improving, now clear and no longer cloudy, however has been getting chills again. No fevers. Urine culture reviewed and resistant to Keflex which is what patient was d/c'd home with. Patient also with AKI upon this admission.   12:46 AM - Discussed urine culture with pharmacist who recommends switching to Levaquin 500mg  Q48hrs. 1st dose given IV in ED today  Creatinine close to baseline today, however still 1.92 limiting choices for gout therapy. Will provide rx for pain meds. Patient with PCP appointment on Monday morning  - keep scheduled appointment and have recheck of foot at that time.   Labs otherwise reassuring. Mild leukocytosis. Lactic wdl. Normal white count. Initially tachycardic which improved with hydration. UA improving from last UA.   Evaluation does not show pathology that would require ongoing emergent intervention or inpatient treatment.  Discussed ABX and home care with patient at wife at bedside. Reasons to return to ED again discussed. Importance of keeping PCP appointment discussed. All questions answered.   Patient discussed with Dr. Elesa Massed who agrees with treatment plan.    Final Clinical Impressions(s) / ED Diagnoses   Final diagnoses:   Complicated UTI (urinary  tract infection)  Acute gout involving toe of right foot, unspecified cause    New Prescriptions New Prescriptions   LEVOFLOXACIN (LEVAQUIN) 500 MG TABLET    Take 1 tablet (500 mg total) by mouth every other day. Starting Monday am 8/20   OXYCODONE-ACETAMINOPHEN (PERCOCET/ROXICET) 5-325 MG TABLET    Take 1 tablet by mouth every 8 (eight) hours as needed for severe pain.     Kendarious Gudino, Chase Picket, PA-C 08/13/17 0325    Kobe Ofallon, Layla Maw, DO 08/13/17 [phone]

## 2017-08-14 ENCOUNTER — Encounter: Payer: Self-pay | Admitting: Family Medicine

## 2017-08-14 ENCOUNTER — Ambulatory Visit (INDEPENDENT_AMBULATORY_CARE_PROVIDER_SITE_OTHER): Payer: PPO

## 2017-08-14 ENCOUNTER — Ambulatory Visit (INDEPENDENT_AMBULATORY_CARE_PROVIDER_SITE_OTHER): Payer: PPO | Admitting: Family Medicine

## 2017-08-14 VITALS — BP 110/70 | HR 86 | Ht 66.0 in | Wt 168.6 lb

## 2017-08-14 DIAGNOSIS — Z1159 Encounter for screening for other viral diseases: Secondary | ICD-10-CM

## 2017-08-14 DIAGNOSIS — N390 Urinary tract infection, site not specified: Secondary | ICD-10-CM

## 2017-08-14 DIAGNOSIS — E785 Hyperlipidemia, unspecified: Secondary | ICD-10-CM

## 2017-08-14 DIAGNOSIS — A419 Sepsis, unspecified organism: Secondary | ICD-10-CM | POA: Diagnosis not present

## 2017-08-14 DIAGNOSIS — E1159 Type 2 diabetes mellitus with other circulatory complications: Secondary | ICD-10-CM | POA: Diagnosis not present

## 2017-08-14 DIAGNOSIS — Z87438 Personal history of other diseases of male genital organs: Secondary | ICD-10-CM

## 2017-08-14 DIAGNOSIS — N179 Acute kidney failure, unspecified: Secondary | ICD-10-CM

## 2017-08-14 DIAGNOSIS — M79671 Pain in right foot: Secondary | ICD-10-CM | POA: Diagnosis not present

## 2017-08-14 DIAGNOSIS — I152 Hypertension secondary to endocrine disorders: Secondary | ICD-10-CM

## 2017-08-14 DIAGNOSIS — E118 Type 2 diabetes mellitus with unspecified complications: Secondary | ICD-10-CM

## 2017-08-14 DIAGNOSIS — M109 Gout, unspecified: Secondary | ICD-10-CM

## 2017-08-14 DIAGNOSIS — I10 Essential (primary) hypertension: Secondary | ICD-10-CM | POA: Diagnosis not present

## 2017-08-14 DIAGNOSIS — M19071 Primary osteoarthritis, right ankle and foot: Secondary | ICD-10-CM | POA: Diagnosis not present

## 2017-08-14 HISTORY — DX: Personal history of other diseases of male genital organs: Z87.438

## 2017-08-14 LAB — LIPID PANEL
Cholesterol: 111 mg/dL (ref 0–200)
HDL: 12.9 mg/dL — ABNORMAL LOW (ref >39.00)
Total CHOL/HDL Ratio: 9
Triglycerides: 410 mg/dL — ABNORMAL HIGH (ref 0.0–149.0)

## 2017-08-14 LAB — CBC WITH DIFFERENTIAL/PLATELET
BASOS PCT: 0.2 % (ref 0.0–3.0)
Basophils Absolute: 0 10*3/uL (ref 0.0–0.1)
Eosinophils Absolute: 0.1 10*3/uL (ref 0.0–0.7)
Eosinophils Relative: 1 % (ref 0.0–5.0)
HEMATOCRIT: 33.3 % — AB (ref 39.0–52.0)
Hemoglobin: 10.6 g/dL — ABNORMAL LOW (ref 13.0–17.0)
LYMPHS PCT: 7.9 % — AB (ref 12.0–46.0)
Lymphs Abs: 0.8 10*3/uL (ref 0.7–4.0)
MCHC: 32 g/dL (ref 30.0–36.0)
MCV: 98.1 fl (ref 78.0–100.0)
MONOS PCT: 6.5 % (ref 3.0–12.0)
Monocytes Absolute: 0.6 10*3/uL (ref 0.1–1.0)
NEUTROS ABS: 8.3 10*3/uL — AB (ref 1.4–7.7)
Neutrophils Relative %: 84.4 % — ABNORMAL HIGH (ref 43.0–77.0)
PLATELETS: 260 10*3/uL (ref 150.0–400.0)
RBC: 3.4 Mil/uL — ABNORMAL LOW (ref 4.22–5.81)
RDW: 18.4 % — AB (ref 11.5–15.5)
WBC: 9.8 10*3/uL (ref 4.0–10.5)

## 2017-08-14 LAB — COMPREHENSIVE METABOLIC PANEL
ALT: 32 U/L (ref 0–53)
AST: 28 U/L (ref 0–37)
Albumin: 2.9 g/dL — ABNORMAL LOW (ref 3.5–5.2)
Alkaline Phosphatase: 71 U/L (ref 39–117)
BUN: 49 mg/dL — ABNORMAL HIGH (ref 6–23)
CALCIUM: 8.9 mg/dL (ref 8.4–10.5)
CHLORIDE: 106 meq/L (ref 96–112)
CO2: 20 meq/L (ref 19–32)
Creatinine, Ser: 2.22 mg/dL — ABNORMAL HIGH (ref 0.40–1.50)
GFR: 31.71 mL/min — ABNORMAL LOW (ref >60.00)
GLUCOSE: 92 mg/dL (ref 70–99)
Potassium: 3.8 mEq/L (ref 3.5–5.1)
Sodium: 138 mEq/L (ref 135–145)
Total Bilirubin: 0.3 mg/dL (ref 0.2–1.2)
Total Protein: 6.7 g/dL (ref 6.0–8.3)

## 2017-08-14 LAB — URINE CULTURE: CULTURE: NO GROWTH

## 2017-08-14 LAB — HEMOGLOBIN A1C: Hgb A1c MFr Bld: 6.7 % — ABNORMAL HIGH (ref 4.6–6.5)

## 2017-08-14 LAB — CULTURE, BLOOD (ROUTINE X 2)
Culture: NO GROWTH
Culture: NO GROWTH
SPECIAL REQUESTS: ADEQUATE
Special Requests: ADEQUATE

## 2017-08-14 LAB — LDL CHOLESTEROL, DIRECT: LDL DIRECT: 21 mg/dL

## 2017-08-14 LAB — URIC ACID: URIC ACID, SERUM: 13.5 mg/dL — AB (ref 4.0–7.8)

## 2017-08-14 MED ORDER — COLCHICINE 0.6 MG PO TABS: 0.3000 mg | ORAL_TABLET | Freq: Every day | ORAL | 1 refills | Status: DC

## 2017-08-14 MED ORDER — GLUCOSE BLOOD VI STRP: ORAL_STRIP | 12 refills | Status: DC

## 2017-08-14 NOTE — Patient Instructions (Addendum)
Continue your course of levaquin.  Continue your blood pressure medications. Do not start lasix until your kidney function improves.  STOP Jardiance. If your sugars are still low, stop the amaryl.   Will will start colchicine today. Take once daily while you have a gout flare. We will check an xray today.   We will check blood work today including your kidney function, A1c, and gout levels.   Come back to see me in 1-2 weeks.  Take care,  Dr [name]

## 2017-08-14 NOTE — Assessment & Plan Note (Addendum)
Check CMET today. Continue holding lasix. Follow up in 2 weeks.

## 2017-08-14 NOTE — Assessment & Plan Note (Addendum)
Continue levaquin - culture sensitive to cipro, will switch if not improve. Will also stop jardiance (increases risk of future severe UTIs). Check CBC and CMET today. Follow up 1-2 weeks.

## 2017-08-14 NOTE — Assessment & Plan Note (Signed)
Continue lipitor. Check lipid panel.  

## 2017-08-14 NOTE — Assessment & Plan Note (Signed)
Start low dose colchicine. Check uric acid level today (may be falsely low in setting of acute flare). Check plain film to rule out fracture or other bony pathology. Follow up in 2 weeks.

## 2017-08-14 NOTE — Addendum Note (Signed)
Addended by: Ardith Dark on: 08/14/2017 01:02 PM   Modules accepted: Level of Service

## 2017-08-14 NOTE — Progress Notes (Signed)
Subjective:  Jerry York is a 66 y.o. male who presents today with a chief complaint of hospital follow up for sepsis secondary to UTI and to establish care.  HPI:  Sepsis Secondary to UTI Patient was admitted to [facility] on 08/08/2017 and discharged on 08/10/2017 for GNR sepsis secondary to UTI secondary to self cathing due to BPH. His cultures were not final at the time of discharge and he was sent home on keflex. While admitted, he was also found to have AKI. Yesterday, patient presented to the ED due to foot pain, however his antibiotic was switched to levaquin due to the sensitivities. Since being discharged, he is doing about the same. Occasional chills. No fevers. No nausea or vomiting. Compliant with the levaquin.  Hypertension, Chronic Problem, New to this provider.  BP Readings from Last 3 Encounters:  08/14/17 110/70  08/13/17 118/61  08/12/17 110/71   Current Medications: Currently only amlodipine and metoprolol. Lasix and HCTZ being held in the setting of AKI  ROS: Denies any chest pain, shortness of breath, dyspnea on exertion, leg edema.   DIABETES Type II, Chronic, New to this provider Medications: He has resumed metformin since being discharge. He is also on jardiance and amaryl. His sugars have been dropping into the 60s and 50s.   BPH, Chronic problem, new to this provider Stable. Currently on flomax and intermittent self catheterization. Has follow up with urology next month.   HLD, Chronic problem, new to this provider  Doing well on atorvastatin.   Anxiety, Chronic problem, new to this provider.  Uses clonopin as needed.   Gout, Chronic proble Patient presented to the ED two days ago with right foot pain. He was given an rx for percocet as his precluded him from receiving NSAIDs. OVerall the pain is stable over the last couple of days. He has been diagnosed with gout in the past and has only had one other flare. HE has never been on prophyalctic  medications. No fevers. No worsening redness or swelling.   ROS: Per HPI, otherwise all systems reviewed and are negative  PMH:  The following were reviewed and entered/updated in epic: Past Medical History:  Diagnosis Date  . Anxiety   . Bilateral renal cysts   . Foley catheter in place   . Gout    02-22-2017 acute right foot gout---  per pt resolved  . Gross hematuria   . Heart murmur   . Hydronephrosis, left   . Hyperlipidemia   . Hypertension   . Renal insufficiency   . Type 2 diabetes mellitus ([place])   . Urinary retention   . Wears glasses    Patient Active Problem List   Diagnosis Date Noted  . History of BPH 08/14/2017  . Type 2 diabetes mellitus ([place]) 08/09/2017  . Gout 08/09/2017  . Hydronephrosis, left 08/09/2017  . Anxiety 08/09/2017  . Atypical chest pain 08/09/2017  . Sepsis secondary to UTI ([place]) 08/08/2017  . AKI (acute kidney injury) ([place]) 08/08/2017  . Hyperlipidemia 08/08/2017  . Hypertension associated with diabetes ([place]) 08/08/2017   Past Surgical History:  Procedure Laterality Date  . APPENDECTOMY  age 29  . CYSTOSCOPY WITH URETEROSCOPY AND STENT PLACEMENT Left 05/08/2017   Procedure: URETEROSCOPY AND STENT PLACEMENT;  Surgeon: Kathie Rhodes, MD;  Location: [facility];  Service: Urology;  Laterality: Left;  . CYSTOSCOPY/RETROGRADE/URETEROSCOPY Bilateral 05/08/2017   Procedure: CYSTOSCOPY/ BILATERAL RETROGRADE;  Surgeon: Kathie Rhodes, MD;  Location: [facility];  Service: Urology;  Laterality: Bilateral;  . INGUINAL HERNIA REPAIR Right 10/10/2000  . KNEE ARTHROSCOPY Right 1980's  . SHOULDER ARTHROSCOPY WITH OPEN ROTATOR CUFF REPAIR Right 1990's    Family History  Problem Relation Age of Onset  . Congestive Heart Failure Mother   . Hypertension Mother   . Hyperlipidemia Mother   . Heart disease Father   . Hypertension Father   . Hyperlipidemia Father   . Hypertension Brother   . Hypertension Daughter   .  Hypertension Son     Medications- reviewed and updated Current Outpatient Prescriptions  Medication Sig Dispense Refill  . acetaminophen (TYLENOL) 325 MG tablet Take 650 mg by mouth every 6 (six) hours as needed for moderate pain.    [name] amLODipine (NORVASC) 10 MG tablet Take 10 mg by mouth daily.     [name] aspirin 325 MG tablet Take 325 mg by mouth 2 (two) times daily.     [name] atorvastatin (LIPITOR) 20 MG tablet Take 20 mg by mouth every evening.     [name] [name] Take 5,000 mg by mouth 2 (two) times daily.     . clonazePAM (KLONOPIN) 0.5 MG tablet Take 0.5 mg by mouth 2 (two) times daily as needed for anxiety.    . Coenzyme Q10 (COQ-10) 100 MG CAPS Take 1 capsule by mouth 2 (two) times daily.    . furosemide (LASIX) 40 MG tablet Take 40 mg by mouth.    [name] glimepiride (AMARYL) 2 MG tablet Take 2 mg by mouth daily with breakfast.    . levofloxacin (LEVAQUIN) 500 MG tablet Take 1 tablet (500 mg total) by mouth every other day. Starting Monday am 8/20 4 tablet 0  . metFORMIN (GLUCOPHAGE) 1000 MG tablet Take 1,000 mg by mouth 2 (two) times daily with a meal.    . metoprolol (LOPRESSOR) 100 MG tablet Take 100 mg by mouth 2 (two) times daily.    . Omega-3 Fatty Acids (FISH OIL) 1200 MG CAPS Take 2,400 mg by mouth 2 (two) times daily.    [name] oxyCODONE-acetaminophen (PERCOCET/ROXICET) 5-325 MG tablet Take 1 tablet by mouth every 8 (eight) hours as needed for severe pain. 12 tablet 0  . tamsulosin (FLOMAX) 0.4 MG CAPS capsule Take 0.8 mg by mouth daily after supper.    . colchicine 0.6 MG tablet Take 0.5 tablets (0.3 mg total) by mouth daily. For gout flares. 30 tablet 1  . glucose blood test strip Use as instructed 100 each 12   No current facility-administered medications for this visit.     Allergies-reviewed and updated Allergies  Allergen Reactions  . Sulfa Antibiotics Other (See Comments)    "make my feet burn"    Social History   Social History  . Marital status: Married    Spouse name: N/A   . Number of children: N/A  . Years of education: N/A   Social History Main Topics  . Smoking status: Never Smoker  . Smokeless tobacco: Never Used  . Alcohol use No  . Drug use: No  . Sexual activity: Yes    Birth control/ protection: Surgical     Comment: vasectomy   Other Topics Concern  . None   Social History Narrative  . None   Objective:  Physical Exam: BP 110/70   Pulse 86   Ht 5' 6"  (1.676 m)   Wt 168 lb 9.6 oz (76.5 kg)   SpO2 98%   BMI 27.21 kg/m   Gen: NAD, resting comfortably CV: RRR  with no murmurs appreciated Pulm: NWOB, CTAB with no crackles, wheezes, or rhonchi GI: Normal bowel sounds present. Soft, Nontender, Nondistended. MSK:  -Rt foot: MTP joint swollen and red. Minimal ROM due to pain. Also pain along first and second metatarsals.  Skin: warm, dry Neuro: grossly normal, moves all extremities Psych: Normal affect and thought content  Results for orders placed or performed during the hospital encounter of 08/12/17 (from the past 72 hour(s))  Urinalysis, Routine w reflex microscopic     Status: Abnormal   Collection Time: 08/12/17  1:24 AM  Result Value Ref Range   Color, Urine YELLOW YELLOW   APPearance CLEAR CLEAR   Specific Gravity, Urine 1.010 1.005 - 1.030   pH 5.0 5.0 - 8.0   Glucose, UA >=500 (A) NEGATIVE mg/dL   Hgb urine dipstick SMALL (A) NEGATIVE   Bilirubin Urine NEGATIVE NEGATIVE   Ketones, ur 5 (A) NEGATIVE mg/dL   Protein, ur 30 (A) NEGATIVE mg/dL   Nitrite NEGATIVE NEGATIVE   Leukocytes, UA MODERATE (A) NEGATIVE   RBC / HPF 0-5 0 - 5 RBC/hpf   WBC, UA 6-30 0 - 5 WBC/hpf   Bacteria, UA RARE (A) NONE SEEN   Squamous Epithelial / LPF NONE SEEN NONE SEEN   Mucus PRESENT    Hyaline Casts, UA PRESENT   Culture, blood (Routine x 2)     Status: None (Preliminary result)   Collection Time: 08/12/17  8:00 PM  Result Value Ref Range   Specimen Description BLOOD RIGHT ARM    Special Requests      BOTTLES DRAWN AEROBIC AND  ANAEROBIC Blood Culture adequate volume   Culture NO GROWTH 2 DAYS    Report Status PENDING   Comprehensive metabolic panel     Status: Abnormal   Collection Time: 08/12/17  8:12 PM  Result Value Ref Range   Sodium 137 135 - 145 mmol/L   Potassium 3.8 3.5 - 5.1 mmol/L   Chloride 105 101 - 111 mmol/L   CO2 16 (L) 22 - 32 mmol/L   Glucose, Bld 189 (H) 65 - 99 mg/dL   BUN 53 (H) 6 - 20 mg/dL   Creatinine, Ser 1.92 (H) 0.61 - 1.24 mg/dL   Calcium 9.3 8.9 - 10.3 mg/dL   Total Protein 7.9 6.5 - 8.1 g/dL   Albumin 2.6 (L) 3.5 - 5.0 g/dL   AST 30 15 - 41 U/L   ALT 51 17 - 63 U/L   Alkaline Phosphatase 76 38 - 126 U/L   Total Bilirubin 0.7 0.3 - 1.2 mg/dL   GFR calc non Af Amer 35 (L) >60 mL/min   GFR calc Af Amer 41 (L) >60 mL/min    Comment: (NOTE) The eGFR has been calculated using the CKD EPI equation. This calculation has not been validated in all clinical situations. eGFR's persistently <60 mL/min signify possible Chronic Kidney Disease.    Anion gap 16 (H) 5 - 15  CBC with Differential     Status: Abnormal   Collection Time: 08/12/17  8:12 PM  Result Value Ref Range   WBC 11.7 (H) 4.0 - 10.5 K/uL   RBC 3.85 (L) 4.22 - 5.81 MIL/uL   Hemoglobin 11.8 (L) 13.0 - 17.0 g/dL   HCT 35.9 (L) 39.0 - 52.0 %   MCV 93.2 78.0 - 100.0 fL   MCH 30.6 26.0 - 34.0 pg   MCHC 32.9 30.0 - 36.0 g/dL   RDW 16.6 (H) 11.5 - 15.5 %   Platelets  183 150 - 400 K/uL   Neutrophils Relative % 84 %   Neutro Abs 9.7 (H) 1.7 - 7.7 K/uL   Lymphocytes Relative 12 %   Lymphs Abs 1.4 0.7 - 4.0 K/uL   Monocytes Relative 4 %   Monocytes Absolute 0.5 0.1 - 1.0 K/uL   Eosinophils Relative 0 %   Eosinophils Absolute 0.0 0.0 - 0.7 K/uL   Basophils Relative 0 %   Basophils Absolute 0.0 0.0 - 0.1 K/uL  Protime-INR     Status: None   Collection Time: 08/12/17  8:12 PM  Result Value Ref Range   Prothrombin Time 15.0 11.4 - 15.2 seconds   INR 1.17   Culture, blood (Routine x 2)     Status: None (Preliminary  result)   Collection Time: 08/12/17  8:17 PM  Result Value Ref Range   Specimen Description BLOOD LEFT ARM    Special Requests IN PEDIATRIC BOTTLE Blood Culture adequate volume    Culture NO GROWTH 2 DAYS    Report Status PENDING   I-Stat CG4 Lactic Acid, ED     Status: None   Collection Time: 08/12/17  8:27 PM  Result Value Ref Range   Lactic Acid, Venous 1.42 0.5 - 1.9 mmol/L  CBG monitoring, ED     Status: Abnormal   Collection Time: 08/12/17 10:21 PM  Result Value Ref Range   Glucose-Capillary 163 (H) 65 - 99 mg/dL   Comment 1 Notify RN   Urine culture     Status: None   Collection Time: 08/13/17  1:24 AM  Result Value Ref Range   Specimen Description URINE, CLEAN CATCH    Special Requests NONE    Culture NO GROWTH    Report Status 08/14/2017 FINAL   I-Stat CG4 Lactic Acid, ED     Status: None   Collection Time: 08/13/17  1:57 AM  Result Value Ref Range   Lactic Acid, Venous 0.67 0.5 - 1.9 mmol/L   Assessment/Plan:  Sepsis secondary to UTI (HCC) Continue levaquin - culture sensitive to cipro, will switch if not improve. Will also stop jardiance (increases risk of future severe UTIs). Check CBC and CMET today. Follow up 1-2 weeks.   Hypertension associated with diabetes ([place]) At goal. Continue amlodipine and metoprolol.   Type 2 diabetes mellitus ([place]) STOP jardiance. Resume metformin pending renal function. Continue amarul. Check A1c today. Consider dlp-4 if needs an addition agent. Preventative healthcare not performed today due to time constraints, consider performing at next appointment or deferring to endocrinologist (referral placed prior to visit today).   Hyperlipidemia Continue lipitor. Check lipid panel.   Gout Start low dose colchicine. Check uric acid level today (may be falsely low in setting of acute flare). Check plain film to rule out fracture or other bony pathology. Follow up in 2 weeks.   AKI (acute kidney injury) (HCC) Check CMET today. Continue  holding lasix. Follow up in 2 weeks.   Preventative Healthcare HCV drawn today. Due for colon cancer screening. Discuss at future appointment.   Algis Greenhouse. Jerline Pain, MD 08/14/2017 12:56 PM

## 2017-08-14 NOTE — Assessment & Plan Note (Signed)
STOP jardiance. Resume metformin pending renal function. Continue amarul. Check A1c today. Consider dlp-4 if needs an addition agent. Preventative healthcare not performed today due to time constraints, consider performing at next appointment or deferring to endocrinologist (referral placed prior to visit today).

## 2017-08-14 NOTE — Assessment & Plan Note (Signed)
At goal. Continue amlodipine and metoprolol.  

## 2017-08-15 ENCOUNTER — Other Ambulatory Visit: Payer: Self-pay

## 2017-08-15 DIAGNOSIS — N179 Acute kidney failure, unspecified: Secondary | ICD-10-CM

## 2017-08-15 LAB — HEPATITIS C ANTIBODY: HCV AB: NONREACTIVE

## 2017-08-15 NOTE — Patient Outreach (Signed)
Transition of care:  First transition of care call by Guy Sandifer NP  I placed call to patient to follow up on progress. Patient reports that he has had 2 visits to the emergency department for gout. Reports that he continues to have pain. Reports recent change in antibiotic for UTI to levaquin.   Patient reports that he has had follow up with primary MD yesterday.  PLAN: Provided my contact information. Offered home visit for Monday August 27th and patient has accepted.  Rowe Pavy, RN, BSN, CEN [facility] [phone]

## 2017-08-16 ENCOUNTER — Telehealth: Payer: Self-pay | Admitting: Family Medicine

## 2017-08-16 NOTE — Telephone Encounter (Signed)
ROI faxed to [facility], [facility], & [facility]

## 2017-08-17 LAB — CULTURE, BLOOD (ROUTINE X 2)
CULTURE: NO GROWTH
Culture: NO GROWTH
Special Requests: ADEQUATE
Special Requests: ADEQUATE

## 2017-08-18 ENCOUNTER — Telehealth: Payer: Self-pay | Admitting: Family Medicine

## 2017-08-18 NOTE — Telephone Encounter (Signed)
Spoke to pt. Pt states he was taken off Metformin and sugars have been going up from 120 to 130 fasting. Pt is concerned. He is not wanting to eat. Pt states he has a bad taste in his mouth. He has had one deviled egg in the last 48 hours.   [facility] [address]  Best contact # for pt: [phone]

## 2017-08-18 NOTE — Telephone Encounter (Signed)
Please review

## 2017-08-18 NOTE — Telephone Encounter (Signed)
120-130 fasting is ok for now. Patient should eat. Once his kidney function improves we can restart the metformin. If he wants to start another agent instead we can start [place] 50mg  daily in the place of metformin until we can recheck his blood work.  . Katina Degree, MD 08/18/2017 11:42 AM

## 2017-08-21 ENCOUNTER — Other Ambulatory Visit: Payer: Self-pay

## 2017-08-21 ENCOUNTER — Telehealth: Payer: Self-pay | Admitting: *Deleted

## 2017-08-21 NOTE — Telephone Encounter (Signed)
Spoke with patient. He is unable to come to the office today due to other appointments. He will keep his fasting 10:15 appt tomorrow and would like to be called with recommendations.

## 2017-08-21 NOTE — Patient Outreach (Addendum)
[facility]) Care Management   08/21/2017  Jerry York [birth year] [id number]  Jerry York is an 66 y.o. male  Subjective:  "My biggest problem is eating". Reports a bad taste in his mont. Reports eating one meal per day. States that he ate an orange for breakfast, no lunch, no snack, part of a protein bar and 1 piece of pizza for dinner.  No bedtime snack on 08/20/2017.  Patient reports that he does not want to eat and he worries about eating because he has no medications for DM.  Patient reports not following DM diet and has not had formal education.   Recent admission to the hospital for gout and urosepsis.  Reports that he continues to have daily chills atleast 2 times per day.  Reports self cath 4 times a day with cloudy urine. No odor to urine.   Reports gout slightly improved. Reports that he has an appointment for labs tomorrow.  Objective:  Arrived to find patient limping when ambulating. Appears sluggish and weak.  After review of medications, patient was still taking his lasix even though it was discontinued at hospital discharge. Vitals:   08/21/17 1115  BP: 104/60  Pulse: 88  Resp: 16  SpO2: 98%  Weight: 164 lb (74.4 kg)  Height: 1.676 m (5' 6" )   Review of Systems  Constitutional: Positive for chills, malaise/fatigue and weight loss.  HENT: Negative.        Reports hoarse voice  Eyes:       Wears glasses  Respiratory: Positive for shortness of breath.   Cardiovascular: Negative.   Gastrointestinal: Negative.   Genitourinary:       Self cath 4 times per day.reports. Reports cloudy urine.  Musculoskeletal: Positive for joint pain.  Skin: Negative.   Neurological: Positive for weakness.  Endo/Heme/Allergies:       Reports no medicines for DM at this time.   Psychiatric/Behavioral: Negative.     Physical Exam  Constitutional: He is oriented to person, place, and time. He appears well-developed and well-nourished.  Cardiovascular: Normal  rate, normal heart sounds and intact distal pulses.   Respiratory: Effort normal and breath sounds normal.  GI: Soft. Bowel sounds are normal.  Musculoskeletal: Normal range of motion. He exhibits edema.  Right foot slightly swollen, no redness or warmth. Positive pulses.  Neurological: He is alert and oriented to person, place, and time.  Skin: Skin is warm and dry.  Feet without sores or lesions.  Psychiatric: He has a normal mood and affect. His behavior is normal. Judgment and thought content normal.    Encounter Medications:   Outpatient Encounter Prescriptions as of 08/21/2017  Medication Sig Note  . acetaminophen (TYLENOL) 325 MG tablet Take 650 mg by mouth every 6 (six) hours as needed for moderate pain.   [name] aspirin 325 MG tablet Take 325 mg by mouth 2 (two) times daily.    [name] atorvastatin (LIPITOR) 20 MG tablet Take 20 mg by mouth every evening.    [name] [name] Take 5,000 mg by mouth 2 (two) times daily.    . clonazePAM (KLONOPIN) 0.5 MG tablet Take 0.5 mg by mouth 2 (two) times daily as needed for anxiety. 08/21/2017: Take 1 tablet at bedtime for sleep  . Coenzyme Q10 (COQ-10) 100 MG CAPS Take 1 capsule by mouth 2 (two) times daily.   . colchicine 0.6 MG tablet Take 0.5 tablets (0.3 mg total) by mouth daily. For gout flares.   . furosemide (LASIX)  40 MG tablet Take 40 mg by mouth.   . metoprolol (LOPRESSOR) 100 MG tablet Take 100 mg by mouth 2 (two) times daily.   . niacin 500 MG tablet Take 500 mg by mouth 2 (two) times daily after a meal.   . tamsulosin (FLOMAX) 0.4 MG CAPS capsule Take 0.8 mg by mouth daily after supper.   [name] amLODipine (NORVASC) 10 MG tablet Take 10 mg by mouth daily.    [name] glimepiride (AMARYL) 2 MG tablet Take 2 mg by mouth daily with breakfast.   . glucose blood test strip Use as instructed   . levofloxacin (LEVAQUIN) 500 MG tablet Take 1 tablet (500 mg total) by mouth every other day. Starting Monday am 8/20 (Patient not taking: Reported on 08/21/2017)  08/21/2017: Finished on 08/20/2017  . metFORMIN (GLUCOPHAGE) 1000 MG tablet Take 1,000 mg by mouth 2 (two) times daily with a meal.   . Omega-3 Fatty Acids (FISH OIL) 1200 MG CAPS Take 2,400 mg by mouth 2 (two) times daily.   [name] oxyCODONE-acetaminophen (PERCOCET/ROXICET) 5-325 MG tablet Take 1 tablet by mouth every 8 (eight) hours as needed for severe pain. (Patient not taking: Reported on 08/21/2017)    No facility-administered encounter medications on file as of 08/21/2017.     Functional Status:   In your present state of health, do you have any difficulty performing the following activities: 08/21/2017 08/10/2017  Hearing? N N  Vision? N N  Difficulty concentrating or making decisions? N N  Walking or climbing stairs? Y N  Comment recent gout flare -  Dressing or bathing? N N  Doing errands, shopping? N N  Preparing Food and eating ? N N  Using the Toilet? N N  In the past six months, have you accidently leaked urine? N N  Comment patient self cath's every 4 hours due to stretched bladder muscle.  Pt self caths.  Do you have problems with loss of bowel control? N N  Managing your Medications? N N  Managing your Finances? N N  Housekeeping or managing your Housekeeping? N N  Some recent data might be hidden    Fall/Depression Screening:    Fall Risk  08/21/2017 08/10/2017 12/12/2016  Falls in the past year? Yes No No  Number falls in past yr: 1 - -  Injury with Fall? No - -  Follow up Falls prevention discussed - -   PHQ 2/9 Scores 08/21/2017 08/10/2017 12/12/2016  PHQ - 2 Score 0 0 0    Assessment:   (1) reviewed [facility] program. Provided new patient packet, [facility] calendar and my contact information.  Consent obtained.  (2) no advanced directives (3) feels weak, no energy, continues to have chills, decreased appetite and bad taste in his mouth (4) recent discharge from hospital for urosepsis. 2 ED visits for gout. (5) reports cloudy urine.  (6) pending labs tomorrow to evaluate kidney  function. (7) pending follow up with Dr. Audree Bane, ( 09/12/2017);  Pending new patient appointment with Dr. Buddy Duty in October.  (8) continues to take Lasix  Plan:  (1)Consent scanned into chart. (2)provided advanced directive packet with instructions on how to complete. Encouraged patient to complete. (3) encouraged patient to discuss all concerns with MD tomorrow. (4) patient will be contacted weekly for transition of care. (5) provided a sterile specimen cup and encouraged patient to take sample with him to appointment tomorrow ( since he has to self cath) (6) encouraged patient to attend lab appointment. Confirmed appointment time. (7) Placed  call to Dr. Kathi Ludwig office and left a message trying to get appointment moved up. Pending a return call. (8) reviewed with patient that lasix was discontinued during hospital admission.  This note sent to MD.  Goal setting an care planning during this home visit and primary goal is to avoid a readmission.   [facility] CM Care Plan Problem One     Most Recent Value  Care Plan Problem One  Diabetes with Acute Kidney Injury (may or may not be related, but pt needs education)  Role Documenting the Problem One  Care Management [place] for Problem One  Active  [facility] Long Term Goal   Pt will not readmit to hospital for complications of diabetes or kidney disease in the next 60 days.  [facility] Long Term Goal Start Date  08/10/17  Interventions for Problem One Long Term Goal  Home visit completed. Reviewed discharge  instructions and medications.   [facility] CM Short Term Goal #1   Pt will go to new MD, Dr. Jerline Pain on Aug 20th.  [facility] CM Short Term Goal #1 Start Date  08/10/17  [facility] CM Short Term Goal #1 Met Date  08/15/17  Interventions for Short Term Goal #1  Pt has appt and transportation to the appt.  [facility] CM Short Term Goal #2   Pt will participate in Transition of care calls over the next 30 days.  [facility] CM Short Term Goal #2 Start Date  08/10/17  Interventions for  Short Term Goal #2  reviewed call back in 1 week. encouraged patient to call sooner if needed. Encouraged  MD follow ups.   [facility] CM Short Term Goal #3  Pt will report FBS and PM glucose readings each week to care manager.  [facility] CM Short Term Goal #3 Start Date  08/10/17  Interventions for Short Tern Goal #3  Provided [facility] calendar and reviewed where to record readings. Encoruaged patient to take readings to MD office visits.   [facility] CM Short Term Goal #4  Patient will report increased appetite and eating atleast 2-3 meals per day for the next 2 weeks.   [facility] CM Short Term Goal #4 Start Date  08/21/17  Interventions for Short Term Goal #4  Reviewed importance of good nutrition to avoid infections. Encouraged patient to eat well and drink well. DM packet provided for low carb snack choices.     [facility] CM Care Plan Problem Two     Most Recent Value  Care Plan Problem Two  Knowledge deficit regarding some basic diabetic education (carb counting, Hgb A1C target, appropriate snacks or treatment for hypoglycemia.  Role Documenting the Problem Two  Care Management Coordinator  Care Plan for Problem Two  Active  [facility] CM Short Term Goal #1   Pt will be able to identify foods that are considered to be carbs and appropriate serving sizes within 30 days.Margie Billet CM Short Term Goal #1 Start Date  08/10/17  Interventions for Short Term Goal #2   provided DM material to read and review.   [facility] CM Short Term Goal #2   Pt will know his current hgb A1C value and his target by the end of 30 days.  [facility] CM Short Term Goal #2 Start Date  08/10/17  [facility] CM Short Term Goal #2 Met Date  08/21/17  [facility] CM Short Term Goal #3   Pt will know what is an appropriate snack for hypoglycemia at the end of 30 days.  [facility] CM  Short Term Goal #3 Start Date  08/10/17  Interventions for Short Term Goal #3  Reviewed health snacks and ways to treat low blood sugar.     Tomasa Rand, RN, BSN, CEN [facility] [employer] [phone]

## 2017-08-21 NOTE — Telephone Encounter (Signed)
Patient states blood sugar is 302. [name] Per team health, patient feels thirsty, has been drinking water and also has chills. Please advise and follow up with patient for symptoms direction.  ------------------------------------------------------------ PLEASE NOTE: All timestamps contained within this report are represented as [place] Standard Time. CONFIDENTIALTY NOTICE: This fax transmission is intended only for the addressee. It contains information that is legally privileged, confidential or otherwise protected from use or disclosure. If you are not the intended recipient, you are strictly prohibited from reviewing, disclosing, copying using or disseminating any of this information or taking any action in reliance on or regarding this information. If you have received this fax in error, please notify us immediately by telephone so that we can arrange for its return to [name]. 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 He has been taken off his metformin recently. Nurse Assessment Nurse: Willeen Cass, RN, Lelon Mast Date/Time (Eastern Time): 08/18/2017 5:45:55 PM Confirm and document reason for call. If symptomatic, describe  symptoms. ---Current BS is 302. Pt has been taken off all medications earlier this week, was on glimepiride bid jardiance qd and metformin qd. BS has been gradually going up. Pt feels thirsty, has been drinking water. Pt also has chills. Pt was in the hospital 1 week ago for blood disease(sepsis) and kidney infxn. Discharged Sunday and saw pcp on Monday. Dr dc'd meds because they thought it was infecting his kidneys. Caller states he called today but hasn't heard back from them . Does the PT have any chronic conditions? (i.e. diabetes, asthma, etc.) ---Yes List chronic conditions. ---diabetes, htn, Guidelines Guideline Title Affirmed Question Diabetes - High Blood Sugar [1] Blood glucose > 240 mg/dl (13 mmol/l) AND [7] does not use insulin (e.g., not insulin-dependent; most people with type 2 diabetes) Disp. Time Lamount Cohen Time) Disposition Final User 08/18/2017 5:56:59 PM Call PCP when Office is Open Yes Willeen Cass, RN, [facility] Advice Given Per Guideline TREATMENT - LIQUIDS: * Drink at least one glass (8 oz; 240 ml) of water per hour for the next 4 hours (Reason: adequate hydration will help lower blood sugar). * Generally, you should try to drink 6-8 glasses of water each day. CALL BACK IF: * Blood glucose over 300 mg/dL (82.4 mmol/l), two or more times in a row. * Urine ketones become moderate or large (or more than 1+); if you check blood ketones, blood ketone test is 1.6 or higher * Vomiting lasting over 4 hours or unable to drink any fluids * Rapid breathing occurs * You become worse or have more questions. CALL PCP WHEN OFFICE IS OPEN: You need to discuss this with your doctor within the next few days. Call him/her during regular office hours. CALL BACK IF: * You become worse. CARE ADVICE given per Diabetes - High Blood Sugar (Adult) guideline. PLEASE NOTE: All timestamps  contained within this report are represented as [place] Standard Time. CONFIDENTIALTY NOTICE: This fax  transmission is intended only for the addressee. It contains information that is legally privileged, confidential or otherwise protected from use or disclosure. If you are not the intended recipient, you are strictly prohibited from reviewing, disclosing, copying using or disseminating any of this information or taking any action in reliance on or regarding this information. If you have received this fax in error, please notify us immediately by telephone so that we can arrange for its return to [name]. Phone: [phone], Toll-Free: [phone], Fax: [phone] Page: 2 of 2 Call Id: [id number]

## 2017-08-21 NOTE — Telephone Encounter (Signed)
Patient can come in today to recheck his kidney function to see if it safe to restart his metformin or I can call in another medication for his blood sugars.  Katina Degree. [name], MD 08/21/2017 8:51 AM

## 2017-08-22 ENCOUNTER — Other Ambulatory Visit: Payer: Self-pay | Admitting: Pharmacist

## 2017-08-22 ENCOUNTER — Other Ambulatory Visit: Payer: Self-pay

## 2017-08-22 ENCOUNTER — Other Ambulatory Visit (INDEPENDENT_AMBULATORY_CARE_PROVIDER_SITE_OTHER): Payer: PPO

## 2017-08-22 DIAGNOSIS — N179 Acute kidney failure, unspecified: Secondary | ICD-10-CM | POA: Diagnosis not present

## 2017-08-22 LAB — BASIC METABOLIC PANEL
BUN: 55 mg/dL — AB (ref 6–23)
CHLORIDE: 107 meq/L (ref 96–112)
CO2: 22 meq/L (ref 19–32)
CREATININE: 1.71 mg/dL — AB (ref 0.40–1.50)
Calcium: 9.5 mg/dL (ref 8.4–10.5)
GFR: 42.85 mL/min — ABNORMAL LOW (ref >60.00)
Glucose, Bld: 150 mg/dL — ABNORMAL HIGH (ref 70–99)
Potassium: 4.3 mEq/L (ref 3.5–5.1)
Sodium: 141 mEq/L (ref 135–145)

## 2017-08-22 MED ORDER — METFORMIN HCL 500 MG PO TABS: 500.0000 mg | ORAL_TABLET | Freq: Once | ORAL | 3 refills | Status: DC

## 2017-08-22 NOTE — Telephone Encounter (Signed)
Pt is aware of lab results and medication changes. See Result note.

## 2017-08-22 NOTE — Telephone Encounter (Signed)
Patient would like other med called in for diabetes management as soon as possible.   He just left his lab visit.  Ty,  -LL

## 2017-08-22 NOTE — Telephone Encounter (Signed)
Awaiting lab results

## 2017-08-22 NOTE — Patient Outreach (Signed)
[facility]) Care Management  08/22/2017  Jerry York 1951-02-20 [id number]  Patient referred to [facility] CM Pharmacist by [facility] Noralyn Pick for questions regarding his medications.   Unsuccessful phone outreach to patient, HIPAA compliant message left requesting return call.   Plan:  If no return call, will make second phone outreach next week.   Tommye Standard, PharmD, [facility] Clinical Pharmacist [facility] [phone]

## 2017-08-23 ENCOUNTER — Other Ambulatory Visit: Payer: Self-pay | Admitting: Pharmacist

## 2017-08-23 ENCOUNTER — Other Ambulatory Visit: Payer: Self-pay

## 2017-08-23 NOTE — Patient Outreach (Signed)
Care Coordination: Placed call to Dr. Daune Perch office regarding new patient appointment. Reviewed case with Raynelle Fanning.  Requested to see if new patient appointment can be moved to a sooner date.  Raynelle Fanning states that she will talk with MD and call patient to inform of appointment.  Placed call to patient and spoke with wife and provided update.  Rowe Pavy, RN, BSN, CEN [facility] [employer] [phone]

## 2017-08-23 NOTE — Patient Outreach (Signed)
[facility]) Care Management  [name]   08/23/2017  IZREAL KOCK [birth year] [id number]  Subjective:  Patient was referred to [facility] CM Pharmacist by [facility] Noralyn Pick for patient reported medication questions following his hospital discharge.   Patient was admitted at [facility] 8/14-8/16/18 for sepsis secondary to urinary tract infection.  He had multiple medication changes following his hospital discharge.    Patient was able to review his medications over the phone with [facility] Pharmacist.    His past medical history is significant for hypertension, type 2 diabetes mellitus, acute kidney injury, gout, hyperlipidemia.     Objective:   Current Medications: Current Outpatient Prescriptions  Medication Sig Dispense Refill  . acetaminophen (TYLENOL) 325 MG tablet Take 650 mg by mouth every 6 (six) hours as needed for moderate pain.    [name] amLODipine (NORVASC) 10 MG tablet Take 10 mg by mouth daily.     [name] aspirin 325 MG tablet Take 325 mg by mouth 2 (two) times daily.     [name] atorvastatin (LIPITOR) 20 MG tablet Take 20 mg by mouth every evening.     [name] [name] Take 5,000 mg by mouth 2 (two) times daily.     . clonazePAM (KLONOPIN) 0.5 MG tablet Take 0.5 mg by mouth 2 (two) times daily as needed for anxiety.    . Coenzyme Q10 (COQ-10) 100 MG CAPS Take 1 capsule by mouth 2 (two) times daily.    . colchicine 0.6 MG tablet Take 0.5 tablets (0.3 mg total) by mouth daily. For gout flares. 30 tablet 1  . furosemide (LASIX) 40 MG tablet Take 40 mg by mouth.    [name] glimepiride (AMARYL) 2 MG tablet Take 2 mg by mouth daily with breakfast.    . glucose blood test strip Use as instructed 100 each 12  . levofloxacin (LEVAQUIN) 500 MG tablet Take 1 tablet (500 mg total) by mouth every other day. Starting Monday am 8/20 (Patient not taking: Reported on 08/21/2017) 4 tablet 0  . metFORMIN (GLUCOPHAGE) 500 MG tablet Take 1 tablet (500 mg total) by mouth once. 180 tablet 3   . metoprolol (LOPRESSOR) 100 MG tablet Take 100 mg by mouth 2 (two) times daily.    . niacin 500 MG tablet Take 500 mg by mouth 2 (two) times daily after a meal.    . Omega-3 Fatty Acids (FISH OIL) 1200 MG CAPS Take 2,400 mg by mouth 2 (two) times daily.    [name] oxyCODONE-acetaminophen (PERCOCET/ROXICET) 5-325 MG tablet Take 1 tablet by mouth every 8 (eight) hours as needed for severe pain. (Patient not taking: Reported on 08/21/2017) 12 tablet 0  . tamsulosin (FLOMAX) 0.4 MG CAPS capsule Take 0.8 mg by mouth daily after supper.     No current facility-administered medications for this visit.     Functional Status: In your present state of health, do you have any difficulty performing the following activities: 08/21/2017 08/10/2017  Hearing? N N  Vision? N N  Difficulty concentrating or making decisions? N N  Walking or climbing stairs? Y N  Comment recent gout flare -  Dressing or bathing? N N  Doing errands, shopping? N N  Preparing Food and eating ? N N  Using the Toilet? N N  In the past six months, have you accidently leaked urine? N N  Comment patient self cath's every 4 hours due to stretched bladder muscle.  Pt self caths.  Do you have problems with loss of  bowel control? N N  Managing your Medications? N N  Managing your Finances? N N  Housekeeping or managing your Housekeeping? N N  Some recent data might be hidden    Fall/Depression Screening: Fall Risk  08/21/2017 08/10/2017 12/12/2016  Falls in the past year? Yes No No  Number falls in past yr: 1 - -  Injury with Fall? No - -  Follow up Falls prevention discussed - -   PHQ 2/9 Scores 08/21/2017 08/10/2017 12/12/2016  PHQ - 2 Score 0 0 0    Assessment:  Medication review by comparing medication list in chart, medication discharge list from 08/10/17 hospital discharge, patient report and PCP notes in this chart.  Patient was recently discharged from hospital and all medications have been reviewed.  Drugs sorted by  system:  Neurologic/Psychologic: -clonazepam   Cardiovascular: -amlodipine---patient reports not taking due to lower extremity edema  -aspirin -atorvastatin  -furosemide---patient reports currently on hold  -metoprolol tartrate  -niacin -fish oil---patient reports not taking due to OTC version being overly fishy   Endocrine: -colchicine  -glimepiride---patient reports currently on hold  -metformin---patient reports was taking ER 1,000 mg once daily prior to admission (patient now on 500 mg once daily per his report)   Pain: -oxycodone/acetaminophen---patient reports no longer taking   Infectious Diseases: -patient was discharged on levofloxacin---completed course  Miscellaneous: -cinnamon  -tamsulosin  -co-q 10    Other issues noted:   1) amlodipine---was on his discharge medication and 08/14/17 PCP office note as taking---patient reports previously stopping due to lower extremity edema and denies taking amlodipine at this time---please clarify.   2) metformin---prior to admission was noted to be on metformin 1,000 mg twice daily---patient reports he was only taking 1,000 mg daily due to GI side effects and reports he has the ER version.  Patient reports currently taking 500 mg once daily after Dr [name] resumed 08/22/17.    3) Fish oil---patient reports not taking OTC fish oil due to overly fishy smell.  If patient is to be on an omega 3 product, consideration could be given to a prescription grade omega 3 fatty acid if it is affordable for patient   4) Jardiance was held following discharge given AKI---Jardiance is also associated with urinary tract infections.  If renal function returns to baseline, consider if increased risk of UTIs outweigh anti-diabetic effects of Jardiance.   Patient counseling  Counseled patient many medications including, metformin, glimepiride, Jardiance, furosemide, hydrochlorothiazide may have been held on hospital discharge due to his worsening  renal function.  Counseled patient he should continue to follow-up with his PCP regarding renal function and take medications as directed by his PCP to reduce risk of ADRs from medications.    Plan:  Will route note to PCP.   Will place follow-up call to patient next week after he sees PCP.   Tommye Standard, PharmD, [facility] Clinical Pharmacist [facility] [phone]

## 2017-08-24 ENCOUNTER — Other Ambulatory Visit: Payer: Self-pay

## 2017-08-24 ENCOUNTER — Ambulatory Visit: Payer: Self-pay | Admitting: Pharmacist

## 2017-08-24 MED ORDER — FREESTYLE SYSTEM KIT: 1.0000 | PACK | 0 refills | Status: DC | PRN

## 2017-08-25 ENCOUNTER — Encounter: Payer: Self-pay | Admitting: Pharmacist

## 2017-08-29 ENCOUNTER — Encounter: Payer: Self-pay | Admitting: Family Medicine

## 2017-08-29 ENCOUNTER — Ambulatory Visit (INDEPENDENT_AMBULATORY_CARE_PROVIDER_SITE_OTHER): Payer: PPO | Admitting: Family Medicine

## 2017-08-29 VITALS — BP 132/72 | HR 54 | Temp 97.8°F | Wt 163.0 lb

## 2017-08-29 DIAGNOSIS — A419 Sepsis, unspecified organism: Secondary | ICD-10-CM

## 2017-08-29 DIAGNOSIS — I10 Essential (primary) hypertension: Secondary | ICD-10-CM

## 2017-08-29 DIAGNOSIS — N179 Acute kidney failure, unspecified: Secondary | ICD-10-CM | POA: Diagnosis not present

## 2017-08-29 DIAGNOSIS — E118 Type 2 diabetes mellitus with unspecified complications: Secondary | ICD-10-CM | POA: Diagnosis not present

## 2017-08-29 DIAGNOSIS — N400 Enlarged prostate without lower urinary tract symptoms: Secondary | ICD-10-CM | POA: Diagnosis not present

## 2017-08-29 DIAGNOSIS — Z0289 Encounter for other administrative examinations: Secondary | ICD-10-CM

## 2017-08-29 DIAGNOSIS — E785 Hyperlipidemia, unspecified: Secondary | ICD-10-CM | POA: Diagnosis not present

## 2017-08-29 DIAGNOSIS — N390 Urinary tract infection, site not specified: Secondary | ICD-10-CM

## 2017-08-29 DIAGNOSIS — E1159 Type 2 diabetes mellitus with other circulatory complications: Secondary | ICD-10-CM

## 2017-08-29 DIAGNOSIS — I152 Hypertension secondary to endocrine disorders: Secondary | ICD-10-CM

## 2017-08-29 DIAGNOSIS — M109 Gout, unspecified: Secondary | ICD-10-CM | POA: Diagnosis not present

## 2017-08-29 DIAGNOSIS — Z87438 Personal history of other diseases of male genital organs: Secondary | ICD-10-CM

## 2017-08-29 LAB — BASIC METABOLIC PANEL
BUN: 23 mg/dL (ref 6–23)
CALCIUM: 8.7 mg/dL (ref 8.4–10.5)
CO2: 24 meq/L (ref 19–32)
Chloride: 113 mEq/L — ABNORMAL HIGH (ref 96–112)
Creatinine, Ser: 1.15 mg/dL (ref 0.40–1.50)
GFR: 67.73 mL/min (ref >60.00)
GLUCOSE: 124 mg/dL — AB (ref 70–99)
Potassium: 4.8 mEq/L (ref 3.5–5.1)
SODIUM: 145 meq/L (ref 135–145)

## 2017-08-29 MED ORDER — SITAGLIPTIN PHOSPHATE 100 MG PO TABS: 50.0000 mg | ORAL_TABLET | Freq: Every day | ORAL | 11 refills | Status: DC

## 2017-08-29 MED ORDER — OMEGA-3-ACID ETHYL ESTERS 1 G PO CAPS: 1.0000 g | ORAL_CAPSULE | Freq: Two times a day (BID) | ORAL | 11 refills | Status: DC

## 2017-08-29 NOTE — Assessment & Plan Note (Signed)
Check BMET today 

## 2017-08-29 NOTE — Progress Notes (Signed)
    Subjective:  Jerry York is a 66 y.o. male who presents today with a chief complaint of DM follow up.   HPI:  DIABETES Type II, Chronic, Uncontrolled Medications: Metformin 500mg  daily. Is adherent to this, but is having some diarrhea. Blood Sugars per patient: Fasting: 120-140s, High: 500s, Low: 51 Interim History-Patient was taken off his jardiance and metformin at his office visit 2 weeks ago. Roughly a week ago he was restarted on half a dose of his metformin  ROS: Denies Hypoglycemia symptoms (palpitations, tremors, anxiousness)   Hypertension, Chronic Problem, Stable BP Readings from Last 3 Encounters:  08/29/17 132/72  08/21/17 104/60  08/14/17 110/70   Home BP monitoring: Yes, Ranges: 120s/60s Current Medications: Metoprolol, 100mg  bid compliant without side effects.  ROS: Denies any chest pain, shortness of breath, dyspnea on exertion, leg edema.   UTI Patient finished his course of levaquin. Doing well without further issues.   AKI Secondary to sepsis secondary to UTI. Patient has not resumed lasix.   Gout Diagnosed 2 weeks ago. Patient with severe right foot pain at that time and started on low dose colchicine. He is still having some pain, but it has significantly improved. No obvious side effects from the colchicine.  HLD Patient currently on atorvastatin 20mg  daily, fish oil daily, and niacin 500mg  daily. Doing well without side effects.  BPH Stable, though would like to be referred to [facility].  Form Completion Patient and wife also request completion of handicap placard form. State that the patient is currently unable to walk for more than 200 feet without stopping to rest.   ROS: Per HPI  PMH: Smoking history reviewed. Never smoker.   Objective:  Physical Exam: BP 132/72   Pulse (!) 54   Temp 97.8 F (36.6 C) (Oral)   Wt 163 lb (73.9 kg)   SpO2 99%   BMI 26.31 kg/m   Gen: NAD, resting comfortably CV: Bradycardic, regular.  with no murmurs appreciated Pulm: NWOB, CTAB with no crackles, wheezes, or rhonchi  Assessment/Plan:  Type 2 diabetes mellitus (HCC) Sugars slightly elevated based on his report today. Will continue half dose metformin and start jardiance 50mg  daily. Check renal function today. Increase doses as tolerated. Follow up in 1 month. Discussed warning signs and reasons to return to care earlier. Due for next A1c in about 2.5 months. Also due for preventative DM care.   Hypertension associated with diabetes (HCC) At goal. Continue metoprolol. Patient mildly bradycardic today. Consider switch to ACE/ARB if continues to be bradycardic.   AKI (acute kidney injury) (HCC) Check BMET today.   Sepsis secondary to UTI (HCC) Resolved. Discussed warning signs of recurrent infection and reasons to return to care.   Hyperlipidemia Last LDL 21. Rx fish oil sent in. Continue niacin and statin.   Gout Continue colchine as needed for acute flares. Check renal function today. Consider starting allopurinol in the near future.   History of BPH Patient desires referral to [facility]. (Previously seen at [facility]). This referral was placed today.   Form Completion Handicap Placard form filled out today and scanned into our EMR.   Follow up in 1 month.   . , MD 08/29/2017 11:15 AM

## 2017-08-29 NOTE — Assessment & Plan Note (Signed)
At goal. Continue metoprolol. Patient mildly bradycardic today. Consider switch to ACE/ARB if continues to be bradycardic.

## 2017-08-29 NOTE — Assessment & Plan Note (Signed)
Continue colchine as needed for acute flares. Check renal function today. Consider starting allopurinol in the near future.

## 2017-08-29 NOTE — Assessment & Plan Note (Signed)
Patient desires referral to [facility]. (Previously seen at [facility]). This referral was placed today.

## 2017-08-29 NOTE — Patient Instructions (Signed)
Start the [place].  Let us know if your sugars are too high or low.  We will check your kidney function today.  No other changes today.  Come back in 1 month, or sooner as needed.  Take care,  Dr [name]

## 2017-08-29 NOTE — Assessment & Plan Note (Signed)
Last LDL 21. Rx fish oil sent in. Continue niacin and statin.

## 2017-08-29 NOTE — Assessment & Plan Note (Signed)
Resolved. Discussed warning signs of recurrent infection and reasons to return to care.

## 2017-08-29 NOTE — Assessment & Plan Note (Signed)
Sugars slightly elevated based on his report today. Will continue half dose metformin and start jardiance 50mg  daily. Check renal function today. Increase doses as tolerated. Follow up in 1 month. Discussed warning signs and reasons to return to care earlier. Due for next A1c in about 2.5 months. Also due for preventative DM care.

## 2017-09-01 ENCOUNTER — Other Ambulatory Visit: Payer: Self-pay

## 2017-09-01 MED ORDER — FREESTYLE SYSTEM KIT: 1.0000 | PACK | 0 refills | Status: DC | PRN

## 2017-09-01 MED ORDER — GLUCOSE BLOOD VI STRP: ORAL_STRIP | 2 refills | Status: DC

## 2017-09-05 DIAGNOSIS — R972 Elevated prostate specific antigen [PSA]: Secondary | ICD-10-CM | POA: Diagnosis not present

## 2017-09-06 DIAGNOSIS — Z79899 Other long term (current) drug therapy: Secondary | ICD-10-CM | POA: Diagnosis not present

## 2017-09-06 DIAGNOSIS — M109 Gout, unspecified: Secondary | ICD-10-CM | POA: Diagnosis not present

## 2017-09-06 DIAGNOSIS — E119 Type 2 diabetes mellitus without complications: Secondary | ICD-10-CM | POA: Diagnosis not present

## 2017-09-06 DIAGNOSIS — Z5181 Encounter for therapeutic drug level monitoring: Secondary | ICD-10-CM | POA: Diagnosis not present

## 2017-09-06 DIAGNOSIS — E785 Hyperlipidemia, unspecified: Secondary | ICD-10-CM | POA: Diagnosis not present

## 2017-09-07 DIAGNOSIS — E119 Type 2 diabetes mellitus without complications: Secondary | ICD-10-CM | POA: Diagnosis not present

## 2017-09-07 DIAGNOSIS — Z7984 Long term (current) use of oral hypoglycemic drugs: Secondary | ICD-10-CM | POA: Diagnosis not present

## 2017-09-07 DIAGNOSIS — Z5181 Encounter for therapeutic drug level monitoring: Secondary | ICD-10-CM | POA: Diagnosis not present

## 2017-09-12 ENCOUNTER — Other Ambulatory Visit: Payer: Self-pay | Admitting: Pharmacist

## 2017-09-12 DIAGNOSIS — N281 Cyst of kidney, acquired: Secondary | ICD-10-CM | POA: Diagnosis not present

## 2017-09-12 DIAGNOSIS — N1330 Unspecified hydronephrosis: Secondary | ICD-10-CM | POA: Diagnosis not present

## 2017-09-12 DIAGNOSIS — R972 Elevated prostate specific antigen [PSA]: Secondary | ICD-10-CM | POA: Diagnosis not present

## 2017-09-12 DIAGNOSIS — R338 Other retention of urine: Secondary | ICD-10-CM | POA: Diagnosis not present

## 2017-09-12 NOTE — Patient Outreach (Signed)
[facility]) Care Management  09/12/2017  Narciso Stoutenburg Eagleson 1951-08-14 [id number]  Unsuccessful phone outreach to follow-up with patient.    HIPAA compliant message left requesting return call.   Plan:  If no return call, will make second phone outreach attempt in the next week.   Tommye Standard, PharmD, [facility] Clinical Pharmacist [facility] [phone]

## 2017-09-14 ENCOUNTER — Other Ambulatory Visit: Payer: Self-pay | Admitting: Pharmacist

## 2017-09-14 NOTE — Patient Outreach (Signed)
[facility]) Care Management  09/14/2017  Damin Salido Cieslinski [birth year] [id number]  Second outreach attempt to patient successful, HIPAA details verified.  Patient reports PCP prescribed prescription omega-3 fatty acid product---he reports tolerating better.    He reports PCP had prescribed sitagliptin but he did not pick up due to cost.  Patient reports he has since seen Dr Sharl Ma with endocrinology, and reports endocrinology may not continue sitagliptin.    Discussed with patient applying to [employer] patient assistance program to see if eligible for assistance with sitagliptin---he reports he doesn't wish to apply at this time if endocrinologist doesn't continue medication.    Patient denies other pharmacy related concerns/questions at this time.    Plan:  Will close patient's pharmacy episode as he denies further needs.    Patient aware he can contact [facility] Pharmacist if new pharmacy needs arise or if he decides he wishes to apply to [employer] patient assistance program.   Tommye Standard, PharmD, [facility] Clinical Pharmacist [facility] [employer] [phone]

## 2017-09-19 ENCOUNTER — Other Ambulatory Visit: Payer: Self-pay

## 2017-09-19 NOTE — Patient Outreach (Signed)
Transition of care: Placed call to patient who reports he is doing well. Reports he had his stent removed and seems to be doing well.  Reports that he had an appointment with Dr. Sharl Ma and no changes in medications.  Patient reports todays CBG of 100 and 130.  Denies any new problems or concerns.   Patient reports that he has to continue to in and out cath due to his bladder not working.   PLAN: Will call patient back in 2-3 week for follow up. Encouraged patient to call sooner if needed.  Rowe Pavy, RN, BSN, CEN [facility] [employer] [phone]

## 2017-09-21 ENCOUNTER — Telehealth: Payer: Self-pay | Admitting: Family Medicine

## 2017-09-21 NOTE — Telephone Encounter (Signed)
Patient has follow-up with me next Tuesday. He should continue checking his blood pressure at home for now. If it is consistently elevated above 150/90 then he should be seen sooner. Otherwise, he can wait until his follow-up next week.  Katina Degree. [name], MD 09/21/2017 3:32 PM

## 2017-09-21 NOTE — Telephone Encounter (Signed)
Called and spoke with pt informing him of Dr. Rhys Martini recommendations. Understanding verbalized nothing further needed at this time.

## 2017-09-21 NOTE — Telephone Encounter (Signed)
Patient's blood pressure has been running about 179/74. Patient is not dizzy or nauseous. Patient was on lisinpril however, was taken off and is not sure if this is causing the blood pressure readings. Call patient to advise as soon as possible.

## 2017-09-21 NOTE — Telephone Encounter (Signed)
Please advise 

## 2017-09-26 ENCOUNTER — Ambulatory Visit (INDEPENDENT_AMBULATORY_CARE_PROVIDER_SITE_OTHER): Payer: PPO | Admitting: Family Medicine

## 2017-09-26 ENCOUNTER — Encounter: Payer: Self-pay | Admitting: Family Medicine

## 2017-09-26 VITALS — BP 146/98 | HR 54 | Temp 98.0°F | Wt 171.5 lb

## 2017-09-26 DIAGNOSIS — I10 Essential (primary) hypertension: Secondary | ICD-10-CM | POA: Diagnosis not present

## 2017-09-26 DIAGNOSIS — Z23 Encounter for immunization: Secondary | ICD-10-CM

## 2017-09-26 DIAGNOSIS — E118 Type 2 diabetes mellitus with unspecified complications: Secondary | ICD-10-CM

## 2017-09-26 DIAGNOSIS — E1159 Type 2 diabetes mellitus with other circulatory complications: Secondary | ICD-10-CM

## 2017-09-26 DIAGNOSIS — I152 Hypertension secondary to endocrine disorders: Secondary | ICD-10-CM

## 2017-09-26 MED ORDER — AMLODIPINE BESYLATE 10 MG PO TABS: 10.0000 mg | ORAL_TABLET | Freq: Every day | ORAL | 3 refills | Status: DC

## 2017-09-26 MED ORDER — FUROSEMIDE 20 MG PO TABS: 20.0000 mg | ORAL_TABLET | Freq: Every day | ORAL | 3 refills | Status: DC

## 2017-09-26 MED ORDER — METFORMIN HCL 500 MG PO TABS: 500.0000 mg | ORAL_TABLET | Freq: Two times a day (BID) | ORAL | 3 refills | Status: DC

## 2017-09-26 NOTE — Assessment & Plan Note (Signed)
Continue metformin 500mg  bid - unable to escalate dose at this time given his issues with diarrhea. Advised patient try taking fiber supplement to see if it helps. Will take off med list as patient was unable to afford. Foot exam performed today. Will check microalbumin. Follow up in 2 months for repeat A1c.

## 2017-09-26 NOTE — Patient Instructions (Signed)
Start the amlodipine and lasix.  Let us know if your blood pressure is not improving as expected.  Drop off the urine sample soon.  Come back in 2 months to recheck your blood sugar and blood pressure.  Take care,  Dr [name]

## 2017-09-26 NOTE — Assessment & Plan Note (Signed)
Above goal today. Discussed restarting lisinopril, though patient would like to avoid for now due to his recent AKI. Will start amlodipine 10mg  daily. Also will restart lasix 20mg  daily due to his LE edema. Follow up in 1-2 months. Advised patient to continue checking blood pressure at home and come back sooner if persistently above 150/90.

## 2017-09-26 NOTE — Progress Notes (Addendum)
    Subjective:  Jerry York is a 66 y.o. male who presents today with a chief complaint of HTN follow up.   HPI:  Hypertension, Chronic Problem, Worsening BP Readings from Last 3 Encounters:  09/26/17 (!) 146/98  08/29/17 132/72  08/21/17 104/60   Home BP monitoring: Yes, Ranges: 160s-170s/80s-90s Current Medications: Metoprolol 100mg  bid, compliant without side effects. Interim History: Patient recently hospitalized with AKI 2/2 urosepsis. He had previously also been on HCTZ, amlodipine, lisinopril, and lasix. He was discharged on metoprolol alone and had done well on that until a few weeks ago when he noticed his blood pressure increasing. No obvious precipitating events.   ROS: Denies any chest pain, shortness of breath, dyspnea on exertion.  DIABETES Type II, Chronic, Stable Medications: metformin 500mg  bid Reports taking and tolerating though is having some diarrhea.  Interim History- Patient was prescribed januvia during his last visit, but was not able to start due to insurance issues.   ROS: Denies Polyuria,Polydipsia, Denies Hypoglycemia symptoms (palpitations, tremors, anxiousness)   ROS: Per HPI  PMH: Smoking history reviewed. Never smoker.   Objective:  Physical Exam: BP (!) 146/98   Pulse (!) 54   Temp 98 F (36.7 C) (Oral)   Wt 171 lb 8 oz (77.8 kg)   SpO2 95%   BMI 27.68 kg/m   Gen: NAD, resting comfortably CV: RRR with no murmurs appreciated Pulm: NWOB, CTAB with no crackles, wheezes, or rhonchi GI: Normal bowel sounds present. Soft, Nontender, Nondistended. MSK: Foot exam normal. 1+ pretibial edema bilaterally.  Skin: Warm, dry Neuro: Grossly normal, moves all extremities Psych: Normal affect and thought content  Assessment/Plan:  Hypertension associated with diabetes (HCC) Above goal today. Discussed restarting lisinopril, though patient would like to avoid for now due to his recent AKI. Will start amlodipine 10mg  daily. Also will restart  lasix 20mg  daily due to his LE edema. Follow up in 1-2 months. Advised patient to continue checking blood pressure at home and come back sooner if persistently above 150/90.  Type 2 diabetes mellitus (HCC) Continue metformin 500mg  bid - unable to escalate dose at this time given his issues with diarrhea. Advised patient try taking fiber supplement to see if it helps. Will take off med list as patient was unable to afford. Foot exam performed today. Will check microalbumin. Follow up in 2 months for repeat A1c.   Preventative Healthcare Flu shot given today.   . , MD 09/26/2017 10:35 AM

## 2017-09-27 LAB — MICROALBUMIN / CREATININE URINE RATIO
Creatinine,U: 95.2 mg/dL
MICROALB UR: 85.3 mg/dL — AB (ref 0.0–1.9)
Microalb Creat Ratio: 89.6 mg/g — ABNORMAL HIGH (ref 0.0–30.0)

## 2017-09-27 NOTE — Addendum Note (Signed)
Addended by: Felix Ahmadi A on: 09/27/2017 01:05 PM   Modules accepted: Orders

## 2017-10-03 DIAGNOSIS — R339 Retention of urine, unspecified: Secondary | ICD-10-CM | POA: Diagnosis not present

## 2017-10-12 DIAGNOSIS — R319 Hematuria, unspecified: Secondary | ICD-10-CM | POA: Diagnosis not present

## 2017-10-12 DIAGNOSIS — N390 Urinary tract infection, site not specified: Secondary | ICD-10-CM | POA: Diagnosis not present

## 2017-10-12 DIAGNOSIS — N1330 Unspecified hydronephrosis: Secondary | ICD-10-CM | POA: Diagnosis not present

## 2017-10-13 ENCOUNTER — Other Ambulatory Visit: Payer: Self-pay

## 2017-10-13 NOTE — Patient Outreach (Signed)
Telephone assessment/ 60 day follow up.   Placed call to patient. No answer. Left a message requesting a call back.  PLAN: will continue outreach efforts.  Rowe Pavy, RN, BSN, CEN [facility] [phone]

## 2017-10-16 ENCOUNTER — Other Ambulatory Visit: Payer: Self-pay

## 2017-10-16 NOTE — Patient Outreach (Signed)
Telephone assessment 60 day post discharge:  Placed call to patient and spoke with wife who reports that patient is doing well. Reports recent UTI and is now established with urology from [facility].  Reports DM doing well with close follow up with Dr. Kerr.     Wife declines any new problems at this time.  Reviewed case closure and she agreed to call for future concerns.  PLAN: Will close case as goals are met. Will send letter to patient and MD.  Amanda Cook, RN, BSN, CEN [facility] Coordinator [phone] 

## 2017-10-17 ENCOUNTER — Telehealth: Payer: Self-pay | Admitting: Family Medicine

## 2017-10-17 NOTE — Telephone Encounter (Signed)
Gout pain in left foot. Needs a script or advice.  Please advise,  -LL

## 2017-10-17 NOTE — Telephone Encounter (Signed)
Pt is currently taking Colchicine 1/2 tablet daily along with Tylenol 200mg  2 tabs every 8 hours. Please advise on any other medication options.

## 2017-10-18 NOTE — Telephone Encounter (Signed)
Pt is aware of annotations.  

## 2017-10-18 NOTE — Telephone Encounter (Signed)
He can take a whole tablet of colchicine at tylenol 1000mg  three times daily. If not improving with this, he should come in for a visit.  . Katina Degree, MD 10/18/2017 7:57 AM

## 2017-10-25 DIAGNOSIS — E119 Type 2 diabetes mellitus without complications: Secondary | ICD-10-CM | POA: Diagnosis not present

## 2017-10-31 DIAGNOSIS — R339 Retention of urine, unspecified: Secondary | ICD-10-CM | POA: Diagnosis not present

## 2017-11-07 ENCOUNTER — Encounter: Payer: PPO | Admitting: Family Medicine

## 2017-11-07 IMAGING — US US RENAL
1 series · 14 of 25 positions shown · non-contrast
Comparison: None.

CLINICAL DATA: Chronic kidney disease

EXAM:
RENAL / URINARY TRACT ULTRASOUND COMPLETE

[Series 1: us renal · 0.23mm/px · 14 of 70 slices shown]
[im 1/70]
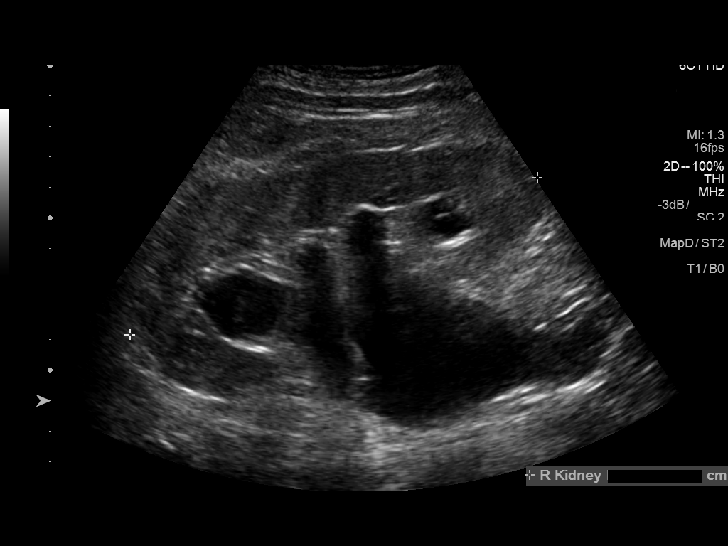
[im 6/70]
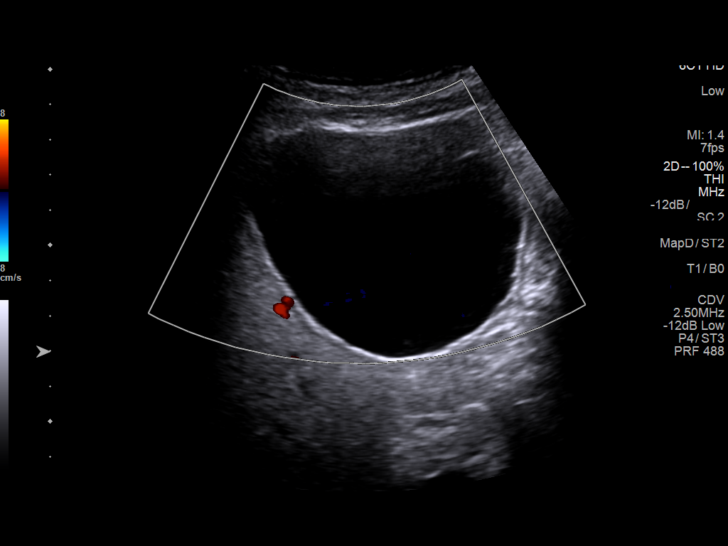
[im 12/70]
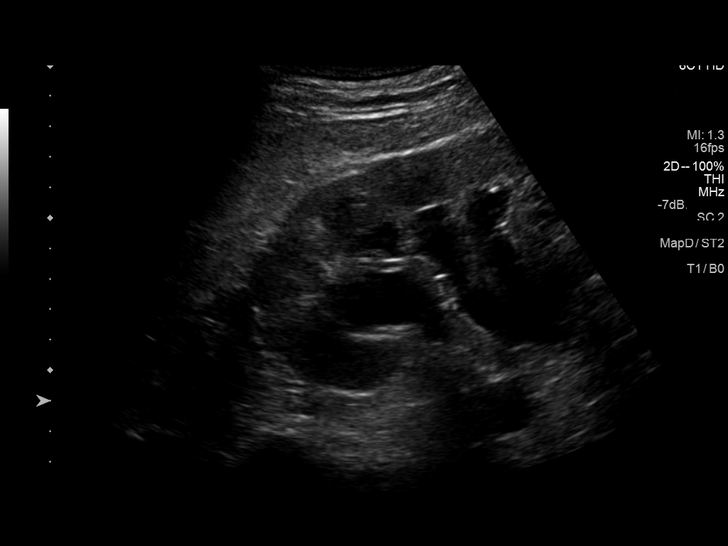
[im 18/70]
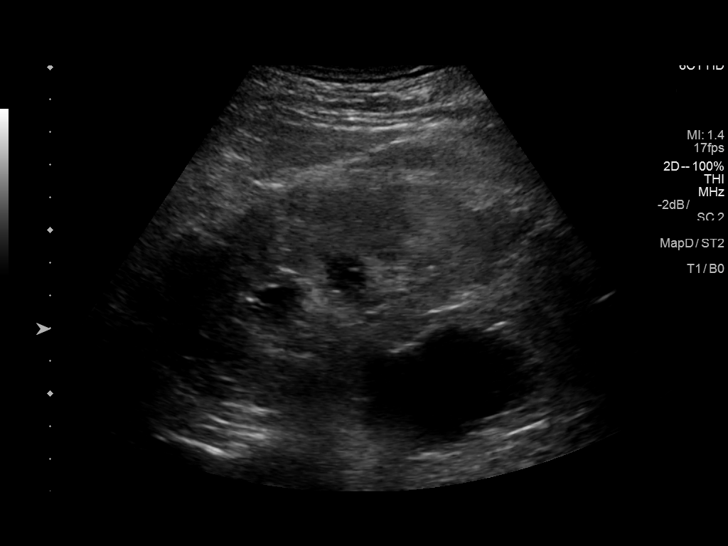
[im 24/70]
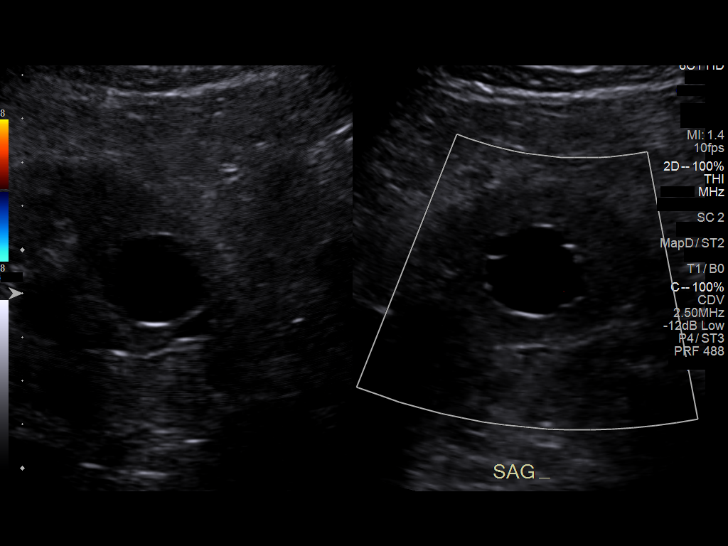
[im 26/70]
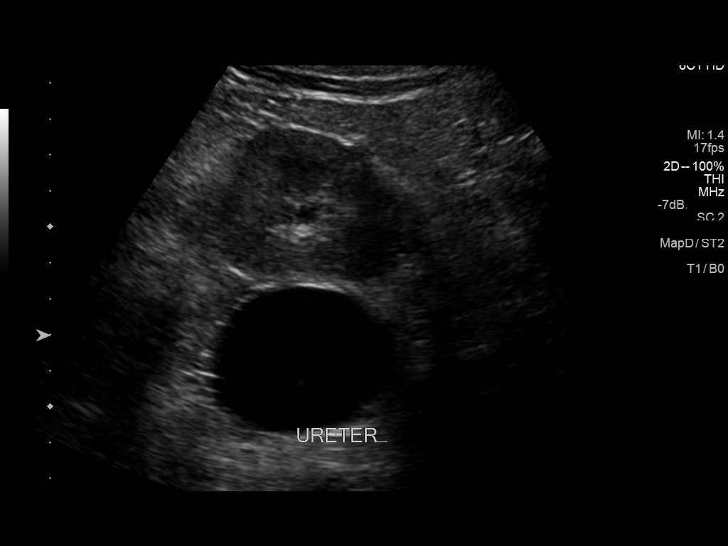
[im 32/70]
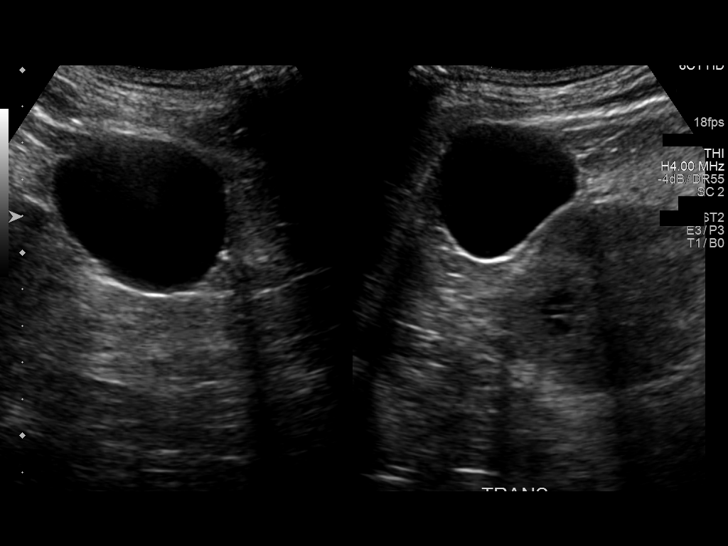
[im 38/70]
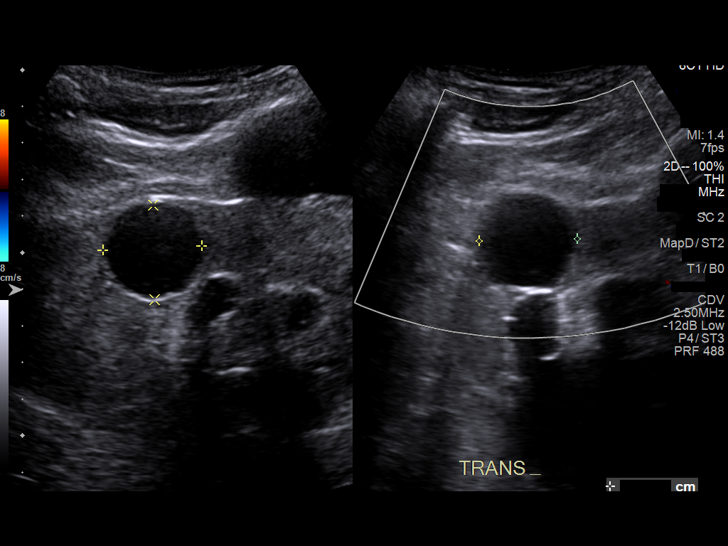
[im 44/70]
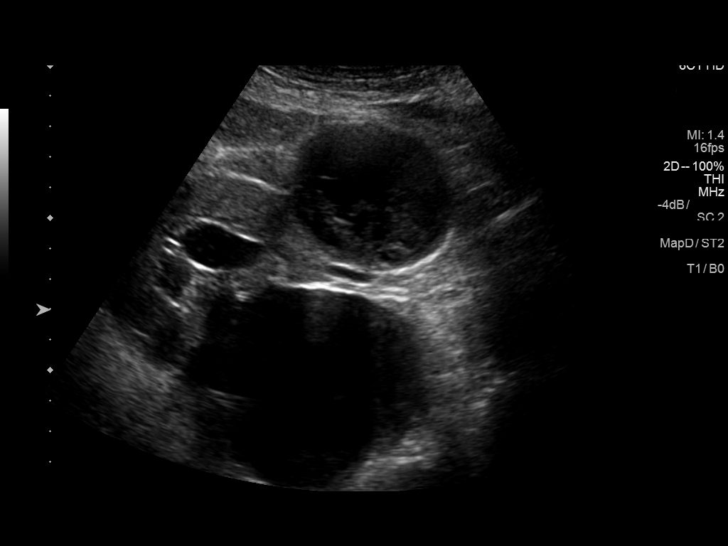
[im 47/70]
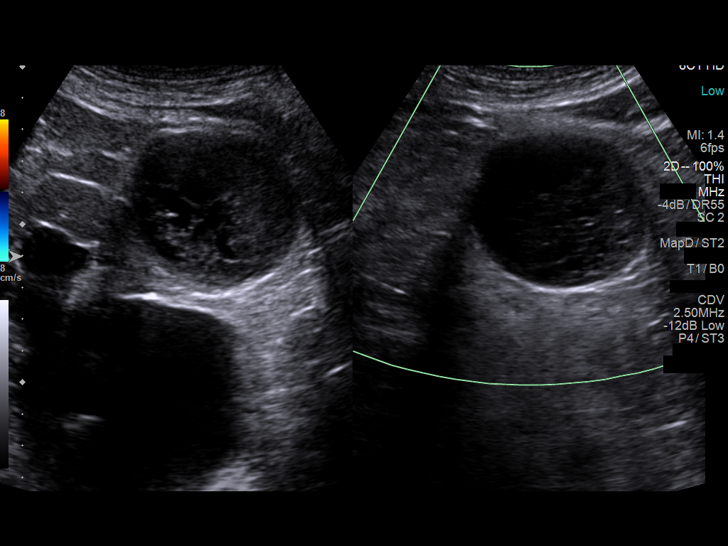
[im 52/70]
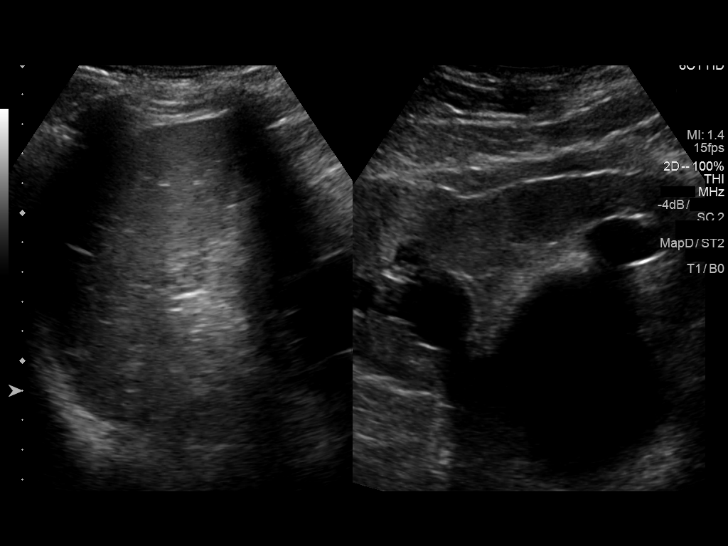
[im 58/70]
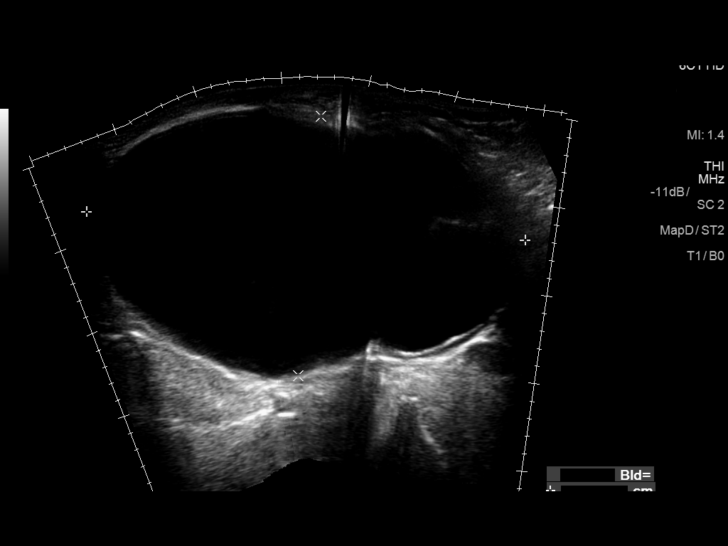
[im 64/70]
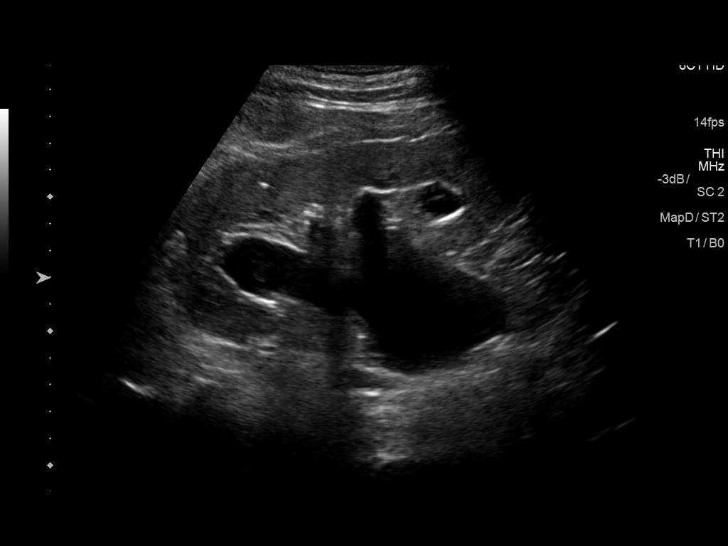
[im 70/70]
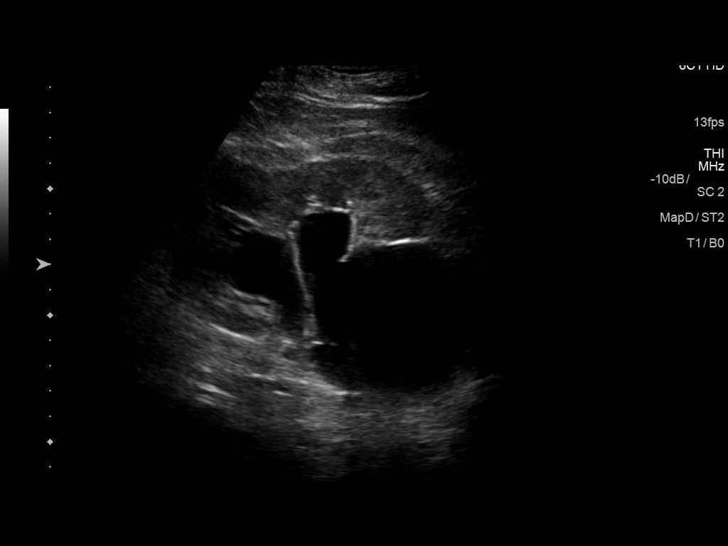

[14 of 25 positions shown; findings below may reference images not displayed]

FINDINGS: Right Kidney:

Length: 14.3 cm. Two upper pole cysts measuring up to 2.3 cm.
Moderate hydroureteronephrosis.

Left Kidney:

Length: 15.8 cm.. 5.3 x 4.9 x 5.6 cm complex/ hemorrhagic interpolar
cyst. Additional 2.7 cm simple interpolar cyst. 4.8 cm lower pole
simple cyst. Moderate hydronephrosis with prominent extrarenal
pelvis.

Bladder:

Distended bladder with large postvoid residual.

Additional comments:  6.7 x 6.3 x 7.7 cm right liver cyst, benign.
IMPRESSION: Moderate bilateral hydroureteronephrosis.

Large postvoid bladder residual.

Bilateral renal cysts, including a 5.6 cm complex/ hemorrhagic
interpolar left renal cyst.

## 2017-11-08 DIAGNOSIS — Z7984 Long term (current) use of oral hypoglycemic drugs: Secondary | ICD-10-CM | POA: Diagnosis not present

## 2017-11-08 DIAGNOSIS — E119 Type 2 diabetes mellitus without complications: Secondary | ICD-10-CM | POA: Diagnosis not present

## 2017-11-10 ENCOUNTER — Ambulatory Visit: Payer: PPO | Admitting: Family Medicine

## 2017-11-10 ENCOUNTER — Encounter: Payer: Self-pay | Admitting: Family Medicine

## 2017-11-10 VITALS — BP 110/70 | HR 65 | Ht 66.0 in | Wt 175.6 lb

## 2017-11-10 DIAGNOSIS — M109 Gout, unspecified: Secondary | ICD-10-CM

## 2017-11-10 DIAGNOSIS — Z23 Encounter for immunization: Secondary | ICD-10-CM | POA: Diagnosis not present

## 2017-11-10 DIAGNOSIS — H9190 Unspecified hearing loss, unspecified ear: Secondary | ICD-10-CM | POA: Diagnosis not present

## 2017-11-10 DIAGNOSIS — R079 Chest pain, unspecified: Secondary | ICD-10-CM

## 2017-11-10 DIAGNOSIS — Z0001 Encounter for general adult medical examination with abnormal findings: Secondary | ICD-10-CM

## 2017-11-10 MED ORDER — DICLOFENAC SODIUM 1 % TD GEL: 4.0000 g | Freq: Four times a day (QID) | TRANSDERMAL | 1 refills | Status: DC

## 2017-11-10 MED ORDER — AMLODIPINE BESYLATE 10 MG PO TABS: 10.0000 mg | ORAL_TABLET | Freq: Every day | ORAL | 1 refills | Status: DC

## 2017-11-10 MED ORDER — TAMSULOSIN HCL 0.4 MG PO CAPS: 0.8000 mg | ORAL_CAPSULE | Freq: Every day | ORAL | 1 refills | Status: DC

## 2017-11-10 MED ORDER — FUROSEMIDE 40 MG PO TABS: 40.0000 mg | ORAL_TABLET | Freq: Every day | ORAL | 1 refills | Status: DC

## 2017-11-10 NOTE — Patient Instructions (Addendum)
You can use colchicine twice daily for your flare.  Use the topical gel.  Let us know when the flare calms down so that we can start a preventative medication.  I would like for you to see a cardiologist for stress testing.  Come back to see me in about a month.  Take care,  Dr [name]

## 2017-11-10 NOTE — Assessment & Plan Note (Signed)
Advised patient to take colchicine 0.6 mg twice daily for his current gout flare.  Given his concern for cardiac disease, will avoid oral NSAIDs at this time.  Prescription for topical Voltaren sent in.  Will start allopurinol once finished with his acute flare.

## 2017-11-10 NOTE — Progress Notes (Signed)
Subjective:    Jerry York is a 66 y.o. male who presents for Medicare Initial Preventive Examination.  He has 2 acute complaints today: 1.  Right great toe pain:  Consistent with past gout flares.  Started 2 days ago.  Taking colchicine 2.3 mg daily.  Is also taking Tylenol which is not significantly seem to help.  2.  Chest pain: Intermittent over the past several weeks to months.  Will occur after walking for about 10 minutes.  Located in the right side of his chest.  Gets better when he stops walking.  Describes as an uncomfortable sensation.  No associated shortness of breath.  No nausea or vomiting.  Preventive Screening-Counseling & Management  Tobacco Social History   Tobacco Use  Smoking Status Never Smoker  Smokeless Tobacco Never Used     Current Problems (verified) Patient Active Problem List   Diagnosis Date Noted  . History of BPH 08/14/2017  . Type 2 diabetes mellitus ([place]) 08/09/2017  . Gout 08/09/2017  . Hydronephrosis, left 08/09/2017  . Anxiety 08/09/2017  . Chest pain 08/09/2017  . Hyperlipidemia 08/08/2017  . Hypertension associated with diabetes ([place]) 08/08/2017    Medications Prior to Visit Current Outpatient Medications on File Prior to Visit  Medication Sig Dispense Refill  . acetaminophen (TYLENOL) 325 MG tablet Take 650 mg by mouth every 6 (six) hours as needed for moderate pain.    [name] aspirin 325 MG tablet Take 325 mg by mouth 2 (two) times daily.     [name] atorvastatin (LIPITOR) 20 MG tablet Take 20 mg by mouth every evening.     [name] [name] Take 5,000 mg by mouth 2 (two) times daily.     . clonazePAM (KLONOPIN) 0.5 MG tablet Take 0.5 mg 2 (two) times daily by mouth.     . Coenzyme Q10 (COQ-10) 100 MG CAPS Take 1 capsule by mouth 2 (two) times daily.    . colchicine 0.6 MG tablet Take 0.5 tablets (0.3 mg total) by mouth daily. For gout flares. 30 tablet 1  . glucose blood ([name] TEST STRIPS) test strip Check blood sugars 2-3 times per day.  DX: E11.9 300 each 2  . glucose monitoring kit ([name]) monitoring kit 1 each by Does not apply route as needed for other. DX Code: E11.9 1 each 0  . losartan (COZAAR) 100 MG tablet Take 25 mg daily by mouth.    . metFORMIN (GLUCOPHAGE) 1000 MG tablet Take 1,000 mg 2 (two) times daily with a meal by mouth.    . metoprolol (LOPRESSOR) 100 MG tablet Take 100 mg by mouth 2 (two) times daily.    . niacin 500 MG tablet Take 500 mg by mouth 2 (two) times daily after a meal.    . nitrofurantoin (MACRODANTIN) 100 MG capsule Take 100 mg 4 (four) times daily by mouth.    . omega-3 acid ethyl esters (LOVAZA) 1 g capsule Take 1 capsule (1 g total) by mouth 2 (two) times daily. 60 capsule 11   No current facility-administered medications on file prior to visit.     Current Medications (verified) Current Outpatient Medications  Medication Sig Dispense Refill  . acetaminophen (TYLENOL) 325 MG tablet Take 650 mg by mouth every 6 (six) hours as needed for moderate pain.    [name] amLODipine (NORVASC) 10 MG tablet Take 1 tablet (10 mg total) daily by mouth. 90 tablet 1  . aspirin 325 MG tablet Take 325 mg by mouth 2 (two) times daily.     [name]  atorvastatin (LIPITOR) 20 MG tablet Take 20 mg by mouth every evening.     . [name] Take 5,000 mg by mouth 2 (two) times daily.     . clonazePAM (KLONOPIN) 0.5 MG tablet Take 0.5 mg 2 (two) times daily by mouth.     . Coenzyme Q10 (COQ-10) 100 MG CAPS Take 1 capsule by mouth 2 (two) times daily.    . colchicine 0.6 MG tablet Take 0.5 tablets (0.3 mg total) by mouth daily. For gout flares. 30 tablet 1  . furosemide (LASIX) 40 MG tablet Take 1 tablet (40 mg total) daily by mouth. 90 tablet 1  . glucose blood ([name] TEST STRIPS) test strip Check blood sugars 2-3 times per day. DX: E11.9 300 each 2  . glucose monitoring kit ([name]) monitoring kit 1 each by Does not apply route as needed for other. DX Code: E11.9 1 each 0  . losartan (COZAAR) 100 MG tablet Take 25  mg daily by mouth.    . metFORMIN (GLUCOPHAGE) 1000 MG tablet Take 1,000 mg 2 (two) times daily with a meal by mouth.    . metoprolol (LOPRESSOR) 100 MG tablet Take 100 mg by mouth 2 (two) times daily.    . niacin 500 MG tablet Take 500 mg by mouth 2 (two) times daily after a meal.    . nitrofurantoin (MACRODANTIN) 100 MG capsule Take 100 mg 4 (four) times daily by mouth.    . omega-3 acid ethyl esters (LOVAZA) 1 g capsule Take 1 capsule (1 g total) by mouth 2 (two) times daily. 60 capsule 11  . tamsulosin (FLOMAX) 0.4 MG CAPS capsule Take 2 capsules (0.8 mg total) daily after supper by mouth. 90 capsule 1  . diclofenac sodium (VOLTAREN) 1 % GEL Apply 4 g 4 (four) times daily topically. 100 g 1   No current facility-administered medications for this visit.      Allergies (verified) Sulfa antibiotics   PAST HISTORY  Past Medical History:  Diagnosis Date  . Anxiety   . Bilateral renal cysts   . Foley catheter in place   . Gout    02-22-2017 acute right foot gout---  per pt resolved  . Gross hematuria   . Heart murmur   . Hydronephrosis, left   . Hyperlipidemia   . Hypertension   . Renal insufficiency   . Type 2 diabetes mellitus (HCC)   . Urinary retention   . Wears glasses    Past Surgical History:  Procedure Laterality Date  . APPENDECTOMY  age 26  . CYSTOSCOPY/ BILATERAL RETROGRADE Bilateral 05/08/2017   Performed by Ottelin, Mark, MD at [facility]  . INGUINAL HERNIA REPAIR Right 10/10/2000  . KNEE ARTHROSCOPY Right 1980's  . SHOULDER ARTHROSCOPY WITH OPEN ROTATOR CUFF REPAIR Right 1990's  . URETEROSCOPY AND STENT PLACEMENT Left 05/08/2017   Performed by Ottelin, Mark, MD at  S[facility]ERY [facility]ENTER   Family History  Problem Relation Age of Onset  . Congestive Heart Failure Mother   . Hypertension Mother   . Hyperlipidemia Mother   . Heart disease Father   . Hypertension Father   . Hyperlipidemia Father   . Hypertension Brother   . Hypertension  Daughter   . Hypertension Son    Social History   Tobacco Use  . Smoking status: Never Smoker  . Smokeless tobacco: Never Used  Substance Use Topics  . Alcohol use: No    Are there smokers in your home (other than you)? No    Risk Factors Current exercise habits: Home exercise routine includes walking 1 hrs per week.  Dietary issues discussed: "Ok." Plenty of fruits and vegetables.    Cardiac risk factors: diabetes mellitus, dyslipidemia, hypertension and microalbuminuria.  Depression Screen (Note: if answer to either of the following is "Yes", a more complete depression screening is indicated)   Over the past 2 weeks, have you felt down, depressed or hopeless? No  Over the past 2 weeks, have you felt little interest or pleasure in doing things? No  Have you lost interest or pleasure in daily life? No  Do you often feel hopeless? No  Do you cry easily over simple problems? No  Activities of Daily Living In your present state of health, do you have any difficulty performing the following activities?:  Driving? No Managing money?  No Feeding yourself? No Getting from bed to chair? No Climbing a flight of stairs? No Preparing food and eating?: No Bathing or showering? No Getting dressed: No Getting to the toilet? No Using the toilet:No Moving around from place to place: No In the past year have you fallen or had a near fall?:No   Are you sexually active?  No  Do you have more than one partner?  No  Hearing Difficulties: No Do you often ask people to speak up or repeat themselves? No Do you experience ringing or noises in your ears? No Do you have difficulty understanding soft or whispered voices? No   Do you feel that you have a problem with memory? No  Do you often misplace items? No  Do you feel safe at home?  Yes  Cognitive Testing  Alert? Yes  Normal Appearance?Yes  Oriented to person? No  Place? No   Time? No  Recall of three objects?  Yes  Can perform  simple calculations? Yes  Displays appropriate judgment?Yes  Can read the correct time from a watch face?Yes   Advanced Directives have been discussed with the patient? Yes  List the Names of Other Physician/Practitioners you currently use: 1.  Dr. Jeffrey Kerr, Endocrinology 2.  Paul Coghlan, urology  Indicate any recent Medical Services you may have received from other than [facility] providers in the past year (date may be approximate).  Immunization History  Administered Date(s) Administered  . Influenza,inj,Quad PF,6+ Mos 09/26/2017  . Pneumococcal Conjugate-13 11/10/2017    Screening Tests Health Maintenance  Topic Date Due  . OPHTHALMOLOGY EXAM  02/24/1962  . TETANUS/TDAP  02/25/1971  . COLONOSCOPY  02/24/2002  . PNA vac Low Risk Adult (1 of 2 - PCV13) 02/24/2017  . HEMOGLOBIN A1C  02/14/2018  . FOOT EXAM  09/26/2018  . INFLUENZA VACCINE  Completed  . Hepatitis C Screening  Completed  . HIV Screening  Completed    All answers were reviewed with the patient and necessary referrals were made:  Caleb Parker, MD   11/10/2017   History reviewed: allergies, current medications, past family history, past medical history, past social history, past surgical history and problem list  Review of Systems Pertinent items noted in HPI and remainder of comprehensive ROS otherwise negative.    Objective:     Vision by Snellen chart: right eye:20/20, left eye:20/20  Hearing impairment detected by auto acoustic emissions test.  Body mass index is 28.34 kg/m. BP 110/70   Pulse 65   Ht 5' 6" (1.676 m)   Wt 175 lb 9.6 oz (79.7 kg)   SpO2 97%   BMI 28.34 kg/m   Gen: NAD, resting   comfortably CV: RRR with no murmurs appreciated Pulm: NWOB, CTAB with no crackles, wheezes, or rhonchi GI: Normal bowel sounds present. Soft, Nontender, Nondistended. MSK: Right great toe with mild erythema and edema.  Full range of motion.  Mildly tender to palpation. Skin: warm, dry Neuro: grossly  normal, moves all extremities Psych: Normal affect and thought content   EKG: Normal sinus rhythm.  RBBB.  Assessment:   Gout Advised patient to take colchicine 0.6 mg twice daily for his current gout flare.  Given his concern for cardiac disease, will avoid oral NSAIDs at this time.  Prescription for topical Voltaren sent in.  Will start allopurinol once finished with his acute flare.  Chest pain Symptoms consistent with classic anginal type symptoms.  Concern for cardiac etiology especially given his significant comorbidities.  EKG today reveals stable RBBB.  Referral to cardiology placed for stress testing.  Return precautions and reasons to seek emergency medical care reviewed.  Hearing loss Referral to audiology placed.   Plan:     During the course of the visit the patient was educated and counseled about appropriate screening and preventive services including:    Pneumococcal vaccine   Obtain colon cancer screening history from prior PCP.   Recommended routine eye exam for diabetic retinopathy screening.  Referral to audiology placed due to hearing loss.  Diet review for nutrition referral? Yes ____  Not Indicated _x__   Patient Instructions (the written plan) was given to the patient.  Medicare Attestation I have personally reviewed: The patient's medical and social history Their use of alcohol, tobacco or illicit drugs Their current medications and supplements The patient's functional ability including ADLs,fall risks, home safety risks, cognitive, and hearing and visual impairment Diet and physical activities Evidence for depression or mood disorders  The patient's weight, height, BMI, and visual acuity have been recorded in the chart.  I have made referrals, counseling, and provided education to the patient based on review of the above and I have provided the patient with a written personalized care plan for preventive services.     Dimas Chyle,  MD   11/10/2017

## 2017-11-10 NOTE — Assessment & Plan Note (Signed)
Symptoms consistent with classic anginal type symptoms.  Concern for cardiac etiology especially given his significant comorbidities.  EKG today reveals stable RBBB.  Referral to cardiology placed for stress testing.  Return precautions and reasons to seek emergency medical care reviewed.

## 2017-11-14 DIAGNOSIS — N281 Cyst of kidney, acquired: Secondary | ICD-10-CM | POA: Diagnosis not present

## 2017-11-14 DIAGNOSIS — N390 Urinary tract infection, site not specified: Secondary | ICD-10-CM | POA: Diagnosis not present

## 2017-11-14 DIAGNOSIS — N1330 Unspecified hydronephrosis: Secondary | ICD-10-CM | POA: Diagnosis not present

## 2017-11-14 DIAGNOSIS — R319 Hematuria, unspecified: Secondary | ICD-10-CM | POA: Diagnosis not present

## 2017-11-20 ENCOUNTER — Telehealth: Payer: Self-pay | Admitting: Family Medicine

## 2017-11-20 DIAGNOSIS — M109 Gout, unspecified: Secondary | ICD-10-CM

## 2017-11-20 NOTE — Telephone Encounter (Signed)
Patient called in reference to letting Dr. [name] know that his gout flare up was over. Patient stated Dr. [name] mentioned prescribing him an anti inflammatory when gout flare up was over. Please advise.

## 2017-11-21 NOTE — Telephone Encounter (Signed)
FYI

## 2017-11-22 ENCOUNTER — Other Ambulatory Visit: Payer: Self-pay

## 2017-11-22 MED ORDER — ALLOPURINOL 100 MG PO TABS: 100.0000 mg | ORAL_TABLET | Freq: Every day | ORAL | 6 refills | Status: DC

## 2017-11-22 NOTE — Telephone Encounter (Signed)
Allopurinol sent in.  Patient should take 1 tablet daily. He should come back in about 4 weeks to recheck his uric acid level and other labs. I have ordered these labs.   He should take the colchicine daily for the first few months while on this medication daily to prevent gout flares.  Katina Degree. [name], MD 11/22/2017 9:45 AM

## 2017-11-22 NOTE — Telephone Encounter (Signed)
OK for refill. I reviewed the database - last refill was at [facility] on 07/25/2017.  Jerry York. [name], MD 11/22/2017 2:27 PM

## 2017-11-22 NOTE — Telephone Encounter (Signed)
Patient is aware of annotations, pending appt on 12/11/2017. He is requesting a refill on Clonazepam 0.5 BID. I called his [facility] pharmacy on [address] and last refill was December 29, 2016 #60. He specifically says he takes this BID and wants a 90 day supply. Can you look to see if this was filled possibly at another pharmacy or okay to send in?

## 2017-11-22 NOTE — Addendum Note (Signed)
Addended by: Ardith Dark on: 11/22/2017 09:46 AM   Modules accepted: Orders

## 2017-11-23 MED ORDER — CLONAZEPAM 0.5 MG PO TABS: 0.5000 mg | ORAL_TABLET | Freq: Two times a day (BID) | ORAL | 0 refills | Status: DC

## 2017-11-23 NOTE — Addendum Note (Signed)
Addended by: Jimmye Norman on: 11/23/2017 01:33 PM   Modules accepted: Orders

## 2017-11-23 NOTE — Telephone Encounter (Signed)
Spoke to pt, told him Dr. [name] said he would refill your Clonazepam 0.5 mg BID. Told pt I will call into pharmacy and it will be ready today for you. Pt verbalized understanding. Rx called into pharmacy.

## 2017-11-24 ENCOUNTER — Other Ambulatory Visit: Payer: Self-pay | Admitting: Family Medicine

## 2017-11-27 ENCOUNTER — Ambulatory Visit: Payer: PPO | Admitting: Cardiology

## 2017-11-27 ENCOUNTER — Ambulatory Visit: Payer: PPO | Admitting: Family Medicine

## 2017-11-27 ENCOUNTER — Encounter: Payer: Self-pay | Admitting: Cardiology

## 2017-11-27 VITALS — BP 110/69 | HR 74 | Ht 66.0 in | Wt 175.0 lb

## 2017-11-27 DIAGNOSIS — Z8249 Family history of ischemic heart disease and other diseases of the circulatory system: Secondary | ICD-10-CM | POA: Diagnosis not present

## 2017-11-27 DIAGNOSIS — E782 Mixed hyperlipidemia: Secondary | ICD-10-CM | POA: Diagnosis not present

## 2017-11-27 DIAGNOSIS — E781 Pure hyperglyceridemia: Secondary | ICD-10-CM

## 2017-11-27 DIAGNOSIS — E1159 Type 2 diabetes mellitus with other circulatory complications: Secondary | ICD-10-CM

## 2017-11-27 DIAGNOSIS — R9431 Abnormal electrocardiogram [ECG] [EKG]: Secondary | ICD-10-CM | POA: Insufficient documentation

## 2017-11-27 DIAGNOSIS — I10 Essential (primary) hypertension: Secondary | ICD-10-CM | POA: Diagnosis not present

## 2017-11-27 DIAGNOSIS — Z01818 Encounter for other preprocedural examination: Secondary | ICD-10-CM

## 2017-11-27 DIAGNOSIS — R079 Chest pain, unspecified: Secondary | ICD-10-CM | POA: Diagnosis not present

## 2017-11-27 DIAGNOSIS — I152 Hypertension secondary to endocrine disorders: Secondary | ICD-10-CM

## 2017-11-27 NOTE — Progress Notes (Addendum)
PCP: [name], MD  Clinic Note: Chief Complaint  Patient presents with  . New Patient (Initial Visit)  . Chest Pain    PVV:ZSMOLM Jerry York is a 66 y.o. male who is being seen today for the evaluation of chest pain at the request of [name], MD. [name] is a history of hypertension, diabetes mellitus type 2 and hyperlipidemia/hypertriglyceridemia with very poorly controlled triglycerides.  His father has a history of CAD with CABG in his 16s and then died in his 48s. He also has history of BPH and neurogenic bladder requiring in and out cathing.  He has had intermittent UTIs.  He also has gallstone disease.  MEILECH VIRTS was last seen on November 16 by his PCP.  He noted intermittent episodes of chest discomfort occurring after walking.  Improves with stopping walking.  Right-sided chest pain is an uncomfortable sensation.  Not associated with dyspnea.    Recent Hospitalizations: none since Aug.  Studies Personally Reviewed - (if available, images/films reviewed: From Epic Chart or Care Everywhere)  2D Echo Aug 2018: Normal LV size with mild LV hypertrophy. EF 60-65%. Aortic sclerosis without significant stenosis. Normal RV size and systolic function.  Interval History: Jerry York is a very pleasant gentleman being referred here for evaluation of exertional chest discomfort.  The chest discomfort he describes is a right sided discomfort underneath the rib cage.  However it does increase with exertion and improves with rest.  He has a history of hypertension, hyperlipidemia and diabetes on oral medications.  He has very bad triglycerides. In addition to the discomfort he has with walking around he notes pedal edema and exertional dyspnea symptoms.  He wears compression socks that does help his pedal edema.  His exertional dyspnea does limit his activity to some extent, but is not associated with any left-sided chest pain.  He denies any PND, orthopnea or edema. No  palpitations, syncope or near syncope.  He does however occasionally have some dizziness spells that are positional. No TIA/amaurosis fugax symptoms. No melena, hematochezia,  or epstaxis.  He has occasionally noted some blood in his urine when he does in and out caths. No claudication.  ROS: A comprehensive was performed. Review of Systems  Constitutional: Positive for malaise/fatigue (Generalized). Negative for chills, fever and weight loss.  HENT: Negative for hearing loss.   Respiratory: Positive for shortness of breath (Some exertional shortness of breath). Negative for cough and wheezing.   Gastrointestinal: Negative for abdominal pain, constipation and heartburn.       Right upper quadrant pain  Genitourinary: Positive for hematuria (Probably related to having to do in out cath.) and urgency. Negative for flank pain.       Neurogenic bladder  Musculoskeletal: Positive for joint pain (Occasional gout flare). Negative for falls.  Neurological: Positive for dizziness (Sometimes positional) and tingling (With some neuropathy pain).  Psychiatric/Behavioral: Negative for memory loss. The patient is not nervous/anxious and does not have insomnia.   All other systems reviewed and are negative.  I have reviewed and (if needed) personally updated the patient's problem list, medications, allergies, past medical and surgical history, social and family history.   Past Medical History:  Diagnosis Date  . Anxiety   . Bilateral renal cysts   . Foley catheter in place   . Gout    02-22-2017 acute right foot gout---  per pt resolved  . Gross hematuria   . Heart murmur   . Hydronephrosis, left   .  Hyperlipidemia   . Hypertension   . Renal insufficiency   . Type 2 diabetes mellitus ([place])   . Urinary retention   . Wears glasses     Past Surgical History:  Procedure Laterality Date  . APPENDECTOMY  age 23  . CYSTOSCOPY WITH URETEROSCOPY AND STENT PLACEMENT Left 05/08/2017   Procedure:  URETEROSCOPY AND STENT PLACEMENT;  Surgeon: Kathie Rhodes, MD;  Location: [facility];  Service: Urology;  Laterality: Left;  . CYSTOSCOPY/RETROGRADE/URETEROSCOPY Bilateral 05/08/2017   Procedure: CYSTOSCOPY/ BILATERAL RETROGRADE;  Surgeon: Kathie Rhodes, MD;  Location: [facility];  Service: Urology;  Laterality: Bilateral;  . INGUINAL HERNIA REPAIR Right 10/10/2000  . KNEE ARTHROSCOPY Right 1980's  . SHOULDER ARTHROSCOPY WITH OPEN ROTATOR CUFF REPAIR Right 1990's    Current Meds  Medication Sig  . acetaminophen (TYLENOL) 325 MG tablet Take 650 mg by mouth every 6 (six) hours as needed for moderate pain.  [name] allopurinol (ZYLOPRIM) 100 MG tablet Take 1 tablet (100 mg total) by mouth daily.  [name] amLODipine (NORVASC) 10 MG tablet Take 1 tablet (10 mg total) daily by mouth.  [name] aspirin 325 MG tablet Take 325 mg by mouth 2 (two) times daily.   [name] atorvastatin (LIPITOR) 20 MG tablet Take 20 mg by mouth every evening.   [name] [name] Take 5,000 mg by mouth 2 (two) times daily.   . ciprofloxacin (CIPRO) 500 MG tablet Take 500 mg by mouth 2 (two) times daily. For 10 days  . clonazePAM (KLONOPIN) 0.5 MG tablet Take 1 tablet (0.5 mg total) by mouth 2 (two) times daily.  . Coenzyme Q10 (COQ-10) 100 MG CAPS Take 1 capsule by mouth 2 (two) times daily.  . colchicine 0.6 MG tablet TAKE 1/2 (ONE-HALF) TABLET (0.3 MG TOTAL) BY MOUTH ONCE DAILY FOR GOUT FLARES  . diclofenac sodium (VOLTAREN) 1 % GEL Apply 4 g 4 (four) times daily topically.  . furosemide (LASIX) 40 MG tablet Take 1 tablet (40 mg total) daily by mouth.  [name] glucose blood ([name] TEST STRIPS) test strip Check blood sugars 2-3 times per day. DX: E11.9  . glucose monitoring kit ([name]) monitoring kit 1 each by Does not apply route as needed for other. DX Code: E11.9  . losartan (COZAAR) 100 MG tablet Take 25 mg daily by mouth.  . metFORMIN (GLUCOPHAGE) 1000 MG tablet Take 1,000 mg 2 (two) times daily with a meal by  mouth.  . metoprolol (LOPRESSOR) 100 MG tablet Take 100 mg by mouth 2 (two) times daily.  . niacin 500 MG tablet Take 500 mg by mouth 2 (two) times daily after a meal.  . omega-3 acid ethyl esters (LOVAZA) 1 g capsule Take 1 capsule (1 g total) by mouth 2 (two) times daily.  . tamsulosin (FLOMAX) 0.4 MG CAPS capsule Take 2 capsules (0.8 mg total) daily after supper by mouth.    Allergies  Allergen Reactions  . Sulfa Antibiotics Other (See Comments)    "make my feet burn"    Social History   Socioeconomic History  . Marital status: Married    Spouse name: None  . Number of children: None  . Years of education: None  . Highest education level: None  Social Needs  . Financial resource strain: None  . Food insecurity - worry: None  . Food insecurity - inability: None  . Transportation needs - medical: None  . Transportation needs - non-medical: None  Occupational History  . Occupation: Retired  Tobacco Use  .  Smoking status: Never Smoker  . Smokeless tobacco: Never Used  Substance and Sexual Activity  . Alcohol use: No  . Drug use: No  . Sexual activity: Yes    Birth control/protection: Surgical    Comment: vasectomy  Other Topics Concern  . None  Social History Narrative  . None    family history includes Congestive Heart Failure in his mother; Coronary artery disease (age of onset: 27) in his father; Hyperlipidemia in his father and mother; Hypertension in his brother, daughter, father, mother, and son.  Wt Readings from Last 3 Encounters:  11/27/17 175 lb (79.4 kg)  11/10/17 175 lb 9.6 oz (79.7 kg)  09/26/17 171 lb 8 oz (77.8 kg)    PHYSICAL EXAM BP 110/69 (BP Location: Left Arm)   Pulse 74   Ht '5\' 6"'$  (1.676 m)   Wt 175 lb (79.4 kg)   SpO2 94%   BMI 28.25 kg/m  Physical Exam  Constitutional: He is oriented to person, place, and time. He appears well-developed and well-nourished. No distress.  Well-groomed  HENT:  Head: Normocephalic and atraumatic.    Mouth/Throat: No oropharyngeal exudate.  Eyes: Conjunctivae and EOM are normal. No scleral icterus.  Neck: No hepatojugular reflux and no JVD present. Carotid bruit is not present. No tracheal deviation present. No thyromegaly present.  Cardiovascular: Normal rate, regular rhythm, normal heart sounds and intact distal pulses. Exam reveals no gallop and no friction rub.  No murmur heard. Nondisplaced PMI  Pulmonary/Chest: Effort normal and breath sounds normal. No respiratory distress. He has no wheezes. He has no rales.  Abdominal: Soft. Bowel sounds are normal. He exhibits no distension. There is tenderness (Some right upper quadrant tenderness). There is no rebound.  Musculoskeletal: Normal range of motion. He exhibits no edema (Trivial) or deformity.  Neurological: He is alert and oriented to person, place, and time. No cranial nerve deficit.  Skin: Skin is warm and dry. No rash noted. No erythema.  Psychiatric: His behavior is normal. Judgment and thought content normal.  Somewhat odd affect.  Normal mood.  Nursing note and vitals reviewed.   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  Voltage criteria for LVH noted.  Narrative Interpretation: Stable EKG.   Other studies Reviewed: Additional studies/ records that were reviewed today include:  Recent Labs:   In March 2018, triglycerides were 123. Lab Results  Component Value Date   CREATININE 1.15 08/29/2017   BUN 23 08/29/2017   NA 145 08/29/2017   K 4.8 08/29/2017   CL 113 (H) 08/29/2017   CO2 24 08/29/2017   Lab Results  Component Value Date   CHOL 111 08/14/2017   HDL 12.90 (L) 08/14/2017   LDLDIRECT 21.0 08/14/2017   TRIG (H) 08/14/2017    410.0 Triglyceride is over 400; calculations on Lipids are invalid.   CHOLHDL 9 08/14/2017   Lab Results  Component Value Date   HGBA1C 6.7  (H) 08/14/2017    ASSESSMENT / PLAN: Problem List Items Addressed This Visit    Abnormal electrocardiogram (ECG) (EKG) - Trifascicular block (Chronic)    Essentially trifascicular block is very concerning on an EKG.  It seems to be stable.  But clearly needs to be evaluated.   Plan: Initial evaluation for ischemia with coronary CTA/CT FFR. Would probably also benefit from a 2D echocardiogram which we can discuss when I see him  in follow-up.  Need to monitor for signs and symptoms of symptomatic bradycardia or pauses.  I suspect the part of the first-degree AV block is related to metoprolol and high-dose.  First step for bradycardia or pauses would be to reduce or discontinue beta-blocker.  However with likely coronary artery disease, beta-blocker is warranted.      Relevant Orders   EKG 12-Lead (Completed)   CT CORONARY MORPH W/CTA COR W/SCORE W/CA W/CM &/OR WO/CM   CT CORONARY FRACTIONAL FLOW RESERVE DATA PREP   CT CORONARY FRACTIONAL FLOW RESERVE FLUID ANALYSIS   Chest pain with moderate risk for cardiac etiology - Primary (Chronic)    The chest discomfort he describes to me seems more consistent with an atypical type of chest discomfort.  However it is associated with exertion, he has a grossly abnormal EKG, and has significant cardiac risk factors of diabetes, hypertension and hyperlipidemia as well as family history.  This alone would make it at least a moderate risk for cardiac etiology. Given his family history of multivessel CAD requiring CABG, I would like to exclude significant coronary disease but also understand his CAD burden.  We would not get this from routine stress test, therefore I would prefer to do a coronary calcium score with coronary CT angiogram and possible CT FFR which would then allow [name] to get both anatomic and physiologic data.  No new symptoms CAD even if not physiologically significant would help [name] determine how aggressive we need to be with lipid management  etc.      Relevant Orders   EKG 12-Lead (Completed)   Basic metabolic panel   CT CORONARY MORPH W/CTA COR W/SCORE W/CA W/CM &/OR WO/CM   CT CORONARY FRACTIONAL FLOW RESERVE DATA PREP   CT CORONARY FRACTIONAL FLOW RESERVE FLUID ANALYSIS   Family history of premature CAD (Chronic)    His father had significant CAD and he himself has significant risk factors. Plan: Coronary CTA with possible CT FFR      Relevant Orders   EKG 12-Lead (Completed)   Basic metabolic panel   CT CORONARY MORPH W/CTA COR W/SCORE W/CA W/CM &/OR WO/CM   CT CORONARY FRACTIONAL FLOW RESERVE DATA PREP   CT CORONARY FRACTIONAL FLOW RESERVE FLUID ANALYSIS   Hyperlipidemia    Unable to Truly Calculate LDL based on severely elevated triglycerides.  Total cholesterol is 111 which is interesting.  I think his main issue is hypertriglyceridemia.      Hypertension associated with diabetes ([place]) (Chronic)    Blood pressure looks better today.  Was recently started on amlodipine 10 mg by PCP in addition to high-dose Lopressor and losartan.      Hypertriglyceridemia (Chronic)    Still has elevated triglycerides.  We will need to figure out beyond atorvastatin with the best option would be to treat.  He is on co-Q10 and niacin plus Lovaza.  We may want to consider switching him to the newer high-dose omega-3 fatty acid medication -Vascepa (With proven outcomes data.)       Other Visit Diagnoses    Pre-op testing       Relevant Orders   Basic metabolic panel      Current medicines are reviewed at length with the patient today. (+/- concerns) none The following changes have been made: none  Patient Instructions  Schedule at [facility] hospital Your physician has requested that you have cardiac CT. Cardiac computed tomography (CT) is a painless test that uses an x-ray machine to take clear, detailed  pictures of your heart. For further information please visit [email or web address]. Please follow instruction sheet as  given.   Please have labs-BMP , at least one week prior to test.   Your physician recommends that you schedule a follow-up appointment in 2 months with Dr Ellyn Hack.    Please arrive at the [facility] main entrance of [facility]  (30-45 minutes prior to test start time)  [facility] [address] [place], [place] [address] [phone]  Proceed to the [facility] Radiology Department (First Floor).  Please follow these instructions carefully (unless otherwise directed):  Hold all erectile dysfunction medications at least 48 hours prior to test.  On the Night Before the Test: . Drink plenty of water. . Do not consume any caffeinated/decaffeinated beverages or chocolate 12 hours prior to your test. . Do not take any antihistamines 12 hours prior to your test. . If you take Metformin do not take 24 hours prior to test.   On the Day of the Test: . Drink plenty of water. Do not drink any water within one hour of the test. . Do not eat any food 4 hours prior to the test. . You may take your regular medications prior to the test. . IF ON A BETA BLOCKER - Take  Your regular dose -100 mg of lopressor (metoprolol) one hour before the test. . HOLD Furosemide morning of the test.  After the Test: . Drink plenty of water. . After receiving IV contrast, you may experience a mild flushed feeling. This is normal. . On occasion, you may experience a mild rash up to 24 hours after the test. This is not dangerous. If this occurs, you can take Benadryl 25 mg and increase your fluid intake. . If you experience trouble breathing, this can be serious. If it is severe call 911 IMMEDIATELY. If it is mild, please call our office. . If you take any of these medications: Glipizide/Metformin, Avandament, Glucavance, please do not take 48 hours after completing test.     Studies Ordered:   Orders Placed This Encounter  Procedures  . CT CORONARY MORPH W/CTA COR W/SCORE W/CA  W/CM &/OR WO/CM  . CT CORONARY FRACTIONAL FLOW RESERVE DATA PREP  . CT CORONARY FRACTIONAL FLOW RESERVE FLUID ANALYSIS  . Basic metabolic panel  . EKG 12-Lead      Glenetta Hew, M.D., M.S. Interventional Cardiologist   Pager # [phone] Phone # [phone] [address] [place], [place] [address]

## 2017-11-27 NOTE — Patient Instructions (Addendum)
Schedule at [facility] hospital Your physician has requested that you have cardiac CT. Cardiac computed tomography (CT) is a painless test that uses an x-ray machine to take clear, detailed pictures of your heart. For further information please visit [email or web address] Please follow instruction sheet as given.   Please have labs-BMP , at least one week prior to test.   Your physician recommends that you schedule a follow-up appointment in 2 months with Dr Herbie Baltimore.    Please arrive at the [facility] main entrance of [facility]  (30-45 minutes prior to test start time)  [facility] [address] [place], [place] [address] [phone]  Proceed to the [facility] Radiology Department (First Floor).  Please follow these instructions carefully (unless otherwise directed):  Hold all erectile dysfunction medications at least 48 hours prior to test.  On the Night Before the Test: . Drink plenty of water. . Do not consume any caffeinated/decaffeinated beverages or chocolate 12 hours prior to your test. . Do not take any antihistamines 12 hours prior to your test. . If you take Metformin do not take 24 hours prior to test.   On the Day of the Test: . Drink plenty of water. Do not drink any water within one hour of the test. . Do not eat any food 4 hours prior to the test. . You may take your regular medications prior to the test. . IF ON A BETA BLOCKER - Take  Your regular dose -100 mg of lopressor (metoprolol) one hour before the test. . HOLD Furosemide morning of the test.  After the Test: . Drink plenty of water. . After receiving IV contrast, you may experience a mild flushed feeling. This is normal. . On occasion, you may experience a mild rash up to 24 hours after the test. This is not dangerous. If this occurs, you can take Benadryl 25 mg and increase your fluid intake. . If you experience trouble breathing, this can be serious. If it is severe call 911  IMMEDIATELY. If it is mild, please call our office. . If you take any of these medications: Glipizide/Metformin, Avandament, Glucavance, please do not take 48 hours after completing test.

## 2017-11-29 ENCOUNTER — Encounter: Payer: Self-pay | Admitting: Cardiology

## 2017-11-29 NOTE — Assessment & Plan Note (Signed)
Essentially trifascicular block is very concerning on an EKG.  It seems to be stable.  But clearly needs to be evaluated.   Plan: Initial evaluation for ischemia with coronary CTA/CT FFR. Would probably also benefit from a 2D echocardiogram which we can discuss when I see him in follow-up.  Need to monitor for signs and symptoms of symptomatic bradycardia or pauses.  I suspect the part of the first-degree AV block is related to metoprolol and high-dose.  First step for bradycardia or pauses would be to reduce or discontinue beta-blocker.  However with likely coronary artery disease, beta-blocker is warranted.

## 2017-11-29 NOTE — Assessment & Plan Note (Signed)
Still has elevated triglycerides.  We will need to figure out beyond atorvastatin with the best option would be to treat.  He is on co-Q10 and niacin plus Lovaza.  We may want to consider switching him to the newer high-dose omega-3 fatty acid medication -Vascepa (With proven outcomes data.)

## 2017-11-29 NOTE — Assessment & Plan Note (Signed)
Blood pressure looks better today.  Was recently started on amlodipine 10 mg by PCP in addition to high-dose Lopressor and losartan.

## 2017-11-29 NOTE — Assessment & Plan Note (Signed)
The chest discomfort he describes to me seems more consistent with an atypical type of chest discomfort.  However it is associated with exertion, he has a grossly abnormal EKG, and has significant cardiac risk factors of diabetes, hypertension and hyperlipidemia as well as family history.  This alone would make it at least a moderate risk for cardiac etiology. Given his family history of multivessel CAD requiring CABG, I would like to exclude significant coronary disease but also understand his CAD burden.  We would not get this from routine stress test, therefore I would prefer to do a coronary calcium score with coronary CT angiogram and possible CT FFR which would then allow [name] to get both anatomic and physiologic data.  No new symptoms CAD even if not physiologically significant would help [name] determine how aggressive we need to be with lipid management etc.

## 2017-11-29 NOTE — Assessment & Plan Note (Signed)
Unable to Truly Calculate LDL based on severely elevated triglycerides.  Total cholesterol is 111 which is interesting.  I think his main issue is hypertriglyceridemia.

## 2017-11-29 NOTE — Assessment & Plan Note (Signed)
His father had significant CAD and he himself has significant risk factors. Plan: Coronary CTA with possible CT FFR

## 2017-11-30 DIAGNOSIS — R339 Retention of urine, unspecified: Secondary | ICD-10-CM | POA: Diagnosis not present

## 2017-12-08 DIAGNOSIS — N390 Urinary tract infection, site not specified: Secondary | ICD-10-CM | POA: Diagnosis not present

## 2017-12-08 DIAGNOSIS — N1330 Unspecified hydronephrosis: Secondary | ICD-10-CM | POA: Diagnosis not present

## 2017-12-08 DIAGNOSIS — N400 Enlarged prostate without lower urinary tract symptoms: Secondary | ICD-10-CM | POA: Diagnosis not present

## 2017-12-08 DIAGNOSIS — R319 Hematuria, unspecified: Secondary | ICD-10-CM | POA: Diagnosis not present

## 2017-12-11 ENCOUNTER — Encounter: Payer: Self-pay | Admitting: Family Medicine

## 2017-12-11 ENCOUNTER — Ambulatory Visit: Payer: PPO | Admitting: Family Medicine

## 2017-12-11 VITALS — BP 124/78 | HR 72 | Temp 97.6°F | Ht 66.0 in | Wt 177.6 lb

## 2017-12-11 DIAGNOSIS — I10 Essential (primary) hypertension: Secondary | ICD-10-CM

## 2017-12-11 DIAGNOSIS — I152 Hypertension secondary to endocrine disorders: Secondary | ICD-10-CM

## 2017-12-11 DIAGNOSIS — R079 Chest pain, unspecified: Secondary | ICD-10-CM | POA: Diagnosis not present

## 2017-12-11 DIAGNOSIS — R600 Localized edema: Secondary | ICD-10-CM

## 2017-12-11 DIAGNOSIS — M109 Gout, unspecified: Secondary | ICD-10-CM | POA: Diagnosis not present

## 2017-12-11 DIAGNOSIS — E1159 Type 2 diabetes mellitus with other circulatory complications: Secondary | ICD-10-CM

## 2017-12-11 DIAGNOSIS — Z87438 Personal history of other diseases of male genital organs: Secondary | ICD-10-CM | POA: Diagnosis not present

## 2017-12-11 NOTE — Assessment & Plan Note (Signed)
Doing well on allopurinol and colchicine.  He will follow-up in 3-6 months to recheck uric acid level.

## 2017-12-11 NOTE — Progress Notes (Signed)
    Subjective:  Jerry York is a 66 y.o. male who presents today with a chief complaint of chest pain follow-up.   HPI:  Chest Pain, established problem, stable Patient seen approximately 1 month ago for his Medicare annual wellness visit.  At that time was describing chest pain with exertion.  There was concern for cardiac etiology and patient was referred to cardiology.  He has since seen them and will be undergoing cardiac CT for further evaluation.  States that the symptoms have only occurred once or twice since our last visit.  Lower extremity edema, new problem, stable Several year history.  Previously on thiazide diuretics which helped.  He is now only on furosemide 40 mg daily and is not sure if it is helping.  No shortness of breath.  No orthopnea.  Does not regularly monitor his salt intake.  Symptoms are worse at night towards the end of the day.  No weight gain.  Gout, established problem, stable Patient seen 1 month ago had an acute gout flare.  The acute flare has since resolved and patient was started on allopurinol 100 mg daily.  He is doing well on this dose without any noted side effects.  BPH, established problem, stable He will be following up with urology next month for TURP.  He still has to self cath to urinate.  ROS: Per HPI  PMH: Smoking history reviewed.  Never smoker.  Objective:  Physical Exam: BP 124/78 (BP Location: Left Arm, Patient Position: Sitting, Cuff Size: Normal)   Pulse 72   Temp 97.6 F (36.4 C) (Oral)   Ht 5\' 6"  (1.676 m)   Wt 177 lb 9.6 oz (80.6 kg)   SpO2 95%   BMI 28.67 kg/m   Gen: NAD, resting comfortably CV: RRR.  2 out of 6 systolic murmur. Pulm: NWOB, CTAB with no crackles, wheezes, or rhonchi MSK: 1-2+ pitting edema to knees bilaterally.   Assessment/Plan:  Lower extremity edema Likely venous insufficiency.  His weight is stable and he has no signs of fluid overload.  He is currently being evaluated by cardiology for  coronary artery disease.  May need an echo at some point in the future, however do not think that he needs this urgently.  We will proceed conservative measures including salt restriction, leg elevation, and possible compression stockings.  Gout Doing well on allopurinol and colchicine.  He will follow-up in 3-6 months to recheck uric acid level.  Hypertension associated with diabetes (HCC) At goal.  Continue amlodipine 10 mg daily, losartan 25 mg daily, and metoprolol 100 mg twice daily.  History of BPH Patient will be undergoing TURP next month.  Chest pain Stable.  Defer further management to cardiology.  . Katina Degree, MD 12/11/2017 9:55 AM

## 2017-12-11 NOTE — Assessment & Plan Note (Addendum)
Likely venous insufficiency.  His weight is stable and he has no signs of fluid overload.  He is currently being evaluated by cardiology for coronary artery disease.  May need an echo at some point in the future, however do not think that he needs this urgently.  We will proceed conservative measures including salt restriction, leg elevation, and possible compression stockings.

## 2017-12-11 NOTE — Assessment & Plan Note (Signed)
Patient will be undergoing TURP next month.

## 2017-12-11 NOTE — Patient Instructions (Signed)
No medication changes today.  Please try avoid salt and elevating your legs. You can look into starting compression stockings to see if it helps. IF not, we can discuss starting additional medications.  Come back to see me in 3-6 months to discuss your gout and diabetes.  Take care,  Dr [name]

## 2017-12-11 NOTE — Assessment & Plan Note (Signed)
At goal.  Continue amlodipine 10 mg daily, losartan 25 mg daily, and metoprolol 100 mg twice daily.

## 2017-12-12 ENCOUNTER — Encounter: Payer: Self-pay | Admitting: Physical Therapy

## 2018-01-01 ENCOUNTER — Ambulatory Visit (INDEPENDENT_AMBULATORY_CARE_PROVIDER_SITE_OTHER): Payer: PPO | Admitting: Family Medicine

## 2018-01-01 ENCOUNTER — Ambulatory Visit (INDEPENDENT_AMBULATORY_CARE_PROVIDER_SITE_OTHER): Payer: PPO

## 2018-01-01 ENCOUNTER — Encounter: Payer: Self-pay | Admitting: Family Medicine

## 2018-01-01 VITALS — BP 118/78 | HR 81 | Temp 98.2°F | Ht 66.0 in | Wt 177.0 lb

## 2018-01-01 DIAGNOSIS — M79672 Pain in left foot: Secondary | ICD-10-CM

## 2018-01-01 DIAGNOSIS — R600 Localized edema: Secondary | ICD-10-CM | POA: Diagnosis not present

## 2018-01-01 DIAGNOSIS — R339 Retention of urine, unspecified: Secondary | ICD-10-CM | POA: Diagnosis not present

## 2018-01-01 DIAGNOSIS — M19072 Primary osteoarthritis, left ankle and foot: Secondary | ICD-10-CM | POA: Diagnosis not present

## 2018-01-01 MED ORDER — COLCHICINE 0.6 MG PO TABS: ORAL_TABLET | ORAL | 1 refills | Status: DC

## 2018-01-01 MED ORDER — FUROSEMIDE 80 MG PO TABS: 80.0000 mg | ORAL_TABLET | Freq: Every day | ORAL | 3 refills | Status: DC

## 2018-01-01 MED ORDER — PREDNISONE 50 MG PO TABS: ORAL_TABLET | ORAL | 0 refills | Status: DC

## 2018-01-01 NOTE — Patient Instructions (Addendum)
Start the prednisone.  Increase your Lasix to 80 mg daily.  Continue all your other medications.  Come back to see me in 1-2 weeks.  Take care, Dr. [name]

## 2018-01-01 NOTE — Progress Notes (Signed)
No fractures seen on radiologist review of xray. Patient should continue treatment plan as we discussed at our visit - no changes needed.  Katina Degree. [name], MD 01/01/2018 12:52 PM

## 2018-01-01 NOTE — Progress Notes (Signed)
    Subjective:  Jerry York is a 67 y.o. male who presents today with a chief complaint of foot pain.   HPI:  Foot pain, acute issue Started 2-3 days ago.  Located on the left outer foot.  Worsened over that time. No obvious precipitating events. Unable to bear weight on his left foot.  Tried Tylenol and colchicine which did not help.  Voltaren gel as not helped. No weakness or numbness.  No fevers or chills.  No redness. He has been taking colchicine one tablet twice daily.  Also with some right knee pain.  Lower extremity edema, established problem, worsening Significantly worsened over the last few weeks.  No obvious precipitating events.  Patient is not clear why the swelling is worse.  No chest pain.  No shortness of breath.  No orthopnea.  ROS: Per HPI  PMH: he reports that  has never smoked. he has never used smokeless tobacco. He reports that he does not drink alcohol or use drugs.  Objective:  Physical Exam: BP 118/78 (BP Location: Left Arm, Patient Position: Sitting, Cuff Size: Normal)   Pulse 81   Temp 98.2 F (36.8 C) (Oral)   Ht 5\' 6"  (1.676 m)   Wt 177 lb (80.3 kg)   SpO2 93%   BMI 28.57 kg/m   Gen: NAD, resting comfortably MSK: -Lower extremities: 3+ pitting edema to knees bilaterally. -Left foot: Grossly edematous.  Tender to palpation along fifth metatarsal.  Neurovascularly intact distally.  Assessment/Plan:  Left foot pain Plain film obtained today.  No obvious acute abnormalities-we will await radiology read.  Symptoms may be secondary to gout flare.  He also has significant bilateral lower extremity edema which could be causing some pain.    Start prednisone-advised patient to stay very aware of his blood sugars.  Continue colchicine.  Continue topical Voltaren.  Follow-up in 1 week.  Lower extremity edema Increase Lasix to 80 mg daily.  He will follow-up next week to repeat renal function test.  . Katina Degree, MD 01/01/2018 11:50 AM

## 2018-01-01 NOTE — Assessment & Plan Note (Signed)
Increase Lasix to 80 mg daily.  He will follow-up next week to repeat renal function test.

## 2018-01-03 DIAGNOSIS — Z8249 Family history of ischemic heart disease and other diseases of the circulatory system: Secondary | ICD-10-CM | POA: Diagnosis not present

## 2018-01-03 DIAGNOSIS — Z01818 Encounter for other preprocedural examination: Secondary | ICD-10-CM | POA: Diagnosis not present

## 2018-01-03 DIAGNOSIS — R079 Chest pain, unspecified: Secondary | ICD-10-CM | POA: Diagnosis not present

## 2018-01-03 LAB — BASIC METABOLIC PANEL
BUN / CREAT RATIO: 30 — AB (ref 10–24)
BUN: 42 mg/dL — ABNORMAL HIGH (ref 8–27)
CALCIUM: 8.2 mg/dL — AB (ref 8.6–10.2)
CO2: 17 mmol/L — ABNORMAL LOW (ref 20–29)
CREATININE: 1.38 mg/dL — AB (ref 0.76–1.27)
Chloride: 104 mmol/L (ref 96–106)
GFR, EST AFRICAN AMERICAN: 62 mL/min/{1.73_m2} (ref >59)
GFR, EST NON AFRICAN AMERICAN: 53 mL/min/{1.73_m2} — AB (ref >59)
Glucose: 249 mg/dL — ABNORMAL HIGH (ref 65–99)
Potassium: 4 mmol/L (ref 3.5–5.2)
Sodium: 140 mmol/L (ref 134–144)

## 2018-01-04 DIAGNOSIS — M109 Gout, unspecified: Secondary | ICD-10-CM | POA: Diagnosis not present

## 2018-01-04 DIAGNOSIS — E785 Hyperlipidemia, unspecified: Secondary | ICD-10-CM | POA: Diagnosis not present

## 2018-01-04 DIAGNOSIS — Z5181 Encounter for therapeutic drug level monitoring: Secondary | ICD-10-CM | POA: Diagnosis not present

## 2018-01-04 DIAGNOSIS — E119 Type 2 diabetes mellitus without complications: Secondary | ICD-10-CM | POA: Diagnosis not present

## 2018-01-10 ENCOUNTER — Ambulatory Visit (INDEPENDENT_AMBULATORY_CARE_PROVIDER_SITE_OTHER): Payer: PPO | Admitting: Family Medicine

## 2018-01-10 ENCOUNTER — Encounter: Payer: Self-pay | Admitting: Family Medicine

## 2018-01-10 VITALS — BP 122/78 | HR 90 | Temp 97.4°F | Ht 66.0 in | Wt 181.2 lb

## 2018-01-10 DIAGNOSIS — R600 Localized edema: Secondary | ICD-10-CM | POA: Diagnosis not present

## 2018-01-10 DIAGNOSIS — M79672 Pain in left foot: Secondary | ICD-10-CM

## 2018-01-10 DIAGNOSIS — M25561 Pain in right knee: Secondary | ICD-10-CM | POA: Diagnosis not present

## 2018-01-10 LAB — BASIC METABOLIC PANEL
BUN: 30 mg/dL — ABNORMAL HIGH (ref 6–23)
CHLORIDE: 104 meq/L (ref 96–112)
CO2: 26 mEq/L (ref 19–32)
Calcium: 8.9 mg/dL (ref 8.4–10.5)
Creatinine, Ser: 1.2 mg/dL (ref 0.40–1.50)
GFR: 64.41 mL/min (ref >60.00)
Glucose, Bld: 219 mg/dL — ABNORMAL HIGH (ref 70–99)
Potassium: 3.6 mEq/L (ref 3.5–5.1)
SODIUM: 140 meq/L (ref 135–145)

## 2018-01-10 NOTE — Assessment & Plan Note (Signed)
Patient up about 4 pounds over the last week and his lower extremity edema has not improved.  We will increase his Lasix to 80 mg twice daily.  Check BMET today.  Follow-up in 2 weeks.

## 2018-01-10 NOTE — Assessment & Plan Note (Signed)
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  Patient is not a candidate for NSAIDs due to his CKD.  He has not been able to tolerate oral prednisone in the past.  Discussed intra-articular steroid injection today however patient deferred.  We will proceed with conservative management including compression to the area and Tylenol as needed.

## 2018-01-10 NOTE — Progress Notes (Signed)
   Subjective:  Jerry York is a 67 y.o. male who presents today with a chief complaint of foot pain follow up.   HPI:  Foot Pain, established problem, improving Patient seen about a week ago for this.  Thought to be most likely secondary to gout flare.  He was started on prednisone which he took for 3 days and then stop due to side effects.  Pain is little bit better.  He also tried Voltaren gel, which did not seem to help very much.  He is now able to bear weight.    Right knee pain, New problem Several year history.  Has remote history of sports injuries and had arthroscopic surgery at one point for a meniscal tear.  Symptoms have worsened over the last week due to his foot pain.  He has not tried any other medications aside from the prednisone as mentioned above.  No swelling.  Knee will occasionally "give out".  Frequent popping and locking.  Bilateral lower extremity edema, established problem, stable Patient also seen for this a week ago.  At that time his Lasix was increased to 80 mg daily.  He has been compliant with this dose and noticed that it makes him urinate a little bit more.  He has not noticed much difference with his lower extremity swelling  ROS: Per HPI  PMH: He reports that  has never smoked. he has never used smokeless tobacco. He reports that he does not drink alcohol or use drugs.   Objective:  Physical Exam: BP 122/78 (BP Location: Left Arm, Patient Position: Sitting, Cuff Size: Normal)   Pulse 90   Temp (!) 97.4 F (36.3 C) (Oral)   Ht 5\' 6"  (1.676 m)   Wt 181 lb 3.2 oz (82.2 kg)   SpO2 97%   BMI 29.25 kg/m   Gen: NAD, resting comfortably CV: RRR with no murmurs appreciated Pulm: NWOB, CTAB with no crackles, wheezes, or rhonchi MSK: -Bilateral lower extremities: 2-3+ pitting edema to knees bilaterally -Right knee: No deformities.  Nontender to palpation.  Significant crepitus with active range of motion.  Stable to varus and valgus stress.  Anterior  and posterior drawer signs negative.  Lockman negative.  Positive McMurray test. -Left knee: No deformities.  Nontender to palpation.  Significant crepitus with active range of motion. -Left Foot: Grossly edematous. Tender to palpation along mid foot. Neurovascularly intact distally  Assessment/Plan:  Lower extremity edema Patient up about 4 pounds over the last week and his lower extremity edema has not improved.  We will increase his Lasix to 80 mg twice daily.  Check BMET today.  Follow-up in 2 weeks.  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  Patient is not a candidate for NSAIDs due to his CKD.  He has not been able to tolerate oral prednisone in the past.  Discussed intra-articular steroid injection today however patient deferred.  We will proceed with conservative management including compression to the area and Tylenol as needed.  Left foot pain Improving however still somewhat persisting.  He does have some evidence of degenerative changes on his plain film and he has a history of gout.  Discussed intra-articular injection however patient deferred.  We will continue with conservative management including compression and Tylenol as needed.  Would consider PT referral in the future if symptoms persist.  . Katina Degree, MD 01/10/2018 10:24 AM

## 2018-01-10 NOTE — Patient Instructions (Signed)
Increase your Lasix to twice daily.  Please place compression to your right knee.  You can also use Tylenol as needed for pain.  If the pain becomes severe we can do cortisone injections in both your knee and ankle.  Please come back to see me in 2 weeks-we need to keep a close eye on your kidney function.  We will check blood work today.  Take care, Dr. [name]

## 2018-01-10 NOTE — Progress Notes (Signed)
Kidney function actually improved - all other labs stable. Continue lasix 80mg  bid and follow up with me in 1-2 weeks.  . Katina Degree, MD 01/10/2018 3:45 PM

## 2018-01-12 ENCOUNTER — Ambulatory Visit (HOSPITAL_COMMUNITY)
Admission: RE | Admit: 2018-01-12 | Discharge: 2018-01-12 | Disposition: A | Discharge status: Home / Self Care | Payer: PPO | Source: Ambulatory Visit | Attending: Cardiology | Admitting: Cardiology

## 2018-01-12 DIAGNOSIS — R079 Chest pain, unspecified: Secondary | ICD-10-CM

## 2018-01-12 DIAGNOSIS — R9431 Abnormal electrocardiogram [ECG] [EKG]: Secondary | ICD-10-CM

## 2018-01-12 DIAGNOSIS — Z8249 Family history of ischemic heart disease and other diseases of the circulatory system: Secondary | ICD-10-CM | POA: Insufficient documentation

## 2018-01-12 MED ORDER — NITROGLYCERIN 0.4 MG SL SUBL
SUBLINGUAL_TABLET | SUBLINGUAL | Status: AC
  Administered 2018-01-12: 0.8 mg via SUBLINGUAL
  Filled 2018-01-12: qty 2

## 2018-01-12 MED ORDER — METOPROLOL TARTRATE 5 MG/5ML IV SOLN
5.0000 mg | INTRAVENOUS | Status: DC | PRN
  Administered 2018-01-12 (×3): 5 mg via INTRAVENOUS

## 2018-01-12 MED ORDER — IOPAMIDOL (ISOVUE-370) INJECTION 76%
INTRAVENOUS | Status: AC
  Administered 2018-01-12: 80 mL via INTRAVENOUS
  Filled 2018-01-12: qty 100

## 2018-01-12 MED ORDER — NITROGLYCERIN 0.4 MG SL SUBL
0.8000 mg | SUBLINGUAL_TABLET | Freq: Once | SUBLINGUAL | Status: AC
  Administered 2018-01-12: 0.8 mg via SUBLINGUAL

## 2018-01-12 MED ORDER — METOPROLOL TARTRATE 5 MG/5ML IV SOLN
INTRAVENOUS | Status: AC
  Administered 2018-01-12: 5 mg via INTRAVENOUS
  Filled 2018-01-12: qty 20

## 2018-01-15 DIAGNOSIS — E119 Type 2 diabetes mellitus without complications: Secondary | ICD-10-CM | POA: Diagnosis not present

## 2018-01-15 DIAGNOSIS — Z7984 Long term (current) use of oral hypoglycemic drugs: Secondary | ICD-10-CM | POA: Diagnosis not present

## 2018-01-16 DIAGNOSIS — Z01818 Encounter for other preprocedural examination: Secondary | ICD-10-CM | POA: Diagnosis not present

## 2018-01-16 DIAGNOSIS — N400 Enlarged prostate without lower urinary tract symptoms: Secondary | ICD-10-CM | POA: Diagnosis not present

## 2018-01-16 DIAGNOSIS — N1330 Unspecified hydronephrosis: Secondary | ICD-10-CM | POA: Diagnosis not present

## 2018-01-17 DIAGNOSIS — I517 Cardiomegaly: Secondary | ICD-10-CM | POA: Diagnosis not present

## 2018-01-17 DIAGNOSIS — I440 Atrioventricular block, first degree: Secondary | ICD-10-CM | POA: Diagnosis not present

## 2018-01-17 DIAGNOSIS — I452 Bifascicular block: Secondary | ICD-10-CM | POA: Diagnosis not present

## 2018-01-17 DIAGNOSIS — R799 Abnormal finding of blood chemistry, unspecified: Secondary | ICD-10-CM | POA: Diagnosis not present

## 2018-01-17 DIAGNOSIS — I444 Left anterior fascicular block: Secondary | ICD-10-CM | POA: Diagnosis not present

## 2018-01-19 DIAGNOSIS — Z960 Presence of urogenital implants: Secondary | ICD-10-CM | POA: Diagnosis not present

## 2018-01-19 DIAGNOSIS — N138 Other obstructive and reflux uropathy: Secondary | ICD-10-CM | POA: Diagnosis not present

## 2018-01-19 DIAGNOSIS — Z79899 Other long term (current) drug therapy: Secondary | ICD-10-CM | POA: Diagnosis not present

## 2018-01-19 DIAGNOSIS — R338 Other retention of urine: Secondary | ICD-10-CM | POA: Diagnosis not present

## 2018-01-19 DIAGNOSIS — N319 Neuromuscular dysfunction of bladder, unspecified: Secondary | ICD-10-CM | POA: Diagnosis not present

## 2018-01-19 DIAGNOSIS — N400 Enlarged prostate without lower urinary tract symptoms: Secondary | ICD-10-CM | POA: Diagnosis not present

## 2018-01-19 DIAGNOSIS — M10071 Idiopathic gout, right ankle and foot: Secondary | ICD-10-CM | POA: Diagnosis not present

## 2018-01-19 DIAGNOSIS — N401 Enlarged prostate with lower urinary tract symptoms: Secondary | ICD-10-CM | POA: Diagnosis not present

## 2018-01-19 DIAGNOSIS — E785 Hyperlipidemia, unspecified: Secondary | ICD-10-CM | POA: Diagnosis not present

## 2018-01-19 DIAGNOSIS — M109 Gout, unspecified: Secondary | ICD-10-CM | POA: Diagnosis not present

## 2018-01-19 DIAGNOSIS — I10 Essential (primary) hypertension: Secondary | ICD-10-CM | POA: Diagnosis not present

## 2018-01-19 DIAGNOSIS — E119 Type 2 diabetes mellitus without complications: Secondary | ICD-10-CM | POA: Diagnosis not present

## 2018-01-19 DIAGNOSIS — Z882 Allergy status to sulfonamides status: Secondary | ICD-10-CM | POA: Diagnosis not present

## 2018-01-19 DIAGNOSIS — F419 Anxiety disorder, unspecified: Secondary | ICD-10-CM | POA: Diagnosis not present

## 2018-01-19 DIAGNOSIS — Z7982 Long term (current) use of aspirin: Secondary | ICD-10-CM | POA: Diagnosis not present

## 2018-01-24 ENCOUNTER — Ambulatory Visit: Payer: PPO | Admitting: Family Medicine

## 2018-01-24 ENCOUNTER — Telehealth: Payer: Self-pay | Admitting: *Deleted

## 2018-01-24 MED ORDER — AMLODIPINE BESYLATE 5 MG PO TABS
10.00 | ORAL_TABLET | ORAL | Status: DC
Start: 2018-01-25 — End: 2018-01-24

## 2018-01-24 MED ORDER — DEXTROSE 50 % IV SOLN
12.00 g | INTRAVENOUS | Status: DC
Start: ? — End: 2018-01-24

## 2018-01-24 MED ORDER — FUROSEMIDE 20 MG PO TABS
20.00 | ORAL_TABLET | ORAL | Status: DC
Start: 2018-01-24 — End: 2018-01-24

## 2018-01-24 MED ORDER — INDOMETHACIN 25 MG PO CAPS
25.00 | ORAL_CAPSULE | ORAL | Status: DC
Start: 2018-01-24 — End: 2018-01-24

## 2018-01-24 MED ORDER — CLONAZEPAM 0.5 MG PO TABS
.50 | ORAL_TABLET | ORAL | Status: DC
Start: 2018-01-25 — End: 2018-01-24

## 2018-01-24 MED ORDER — INSULIN LISPRO 100 UNIT/ML ~~LOC~~ SOLN
2.00 | SUBCUTANEOUS | Status: DC
Start: 2018-01-24 — End: 2018-01-24

## 2018-01-24 MED ORDER — GENERIC EXTERNAL MEDICATION
1.00 | Status: DC
Start: ? — End: 2018-01-24

## 2018-01-24 MED ORDER — METOPROLOL TARTRATE 50 MG PO TABS
100.00 | ORAL_TABLET | ORAL | Status: DC
Start: 2018-01-24 — End: 2018-01-24

## 2018-01-24 MED ORDER — GLUCOSE 40 % PO GEL
15.00 g | ORAL | Status: DC
Start: ? — End: 2018-01-24

## 2018-01-24 MED ORDER — LOSARTAN POTASSIUM 50 MG PO TABS
100.00 | ORAL_TABLET | ORAL | Status: DC
Start: 2018-01-25 — End: 2018-01-24

## 2018-01-24 MED ORDER — ATORVASTATIN CALCIUM 10 MG PO TABS
20.00 | ORAL_TABLET | ORAL | Status: DC
Start: 2018-01-25 — End: 2018-01-24

## 2018-01-24 MED ORDER — ALLOPURINOL 100 MG PO TABS
100.00 | ORAL_TABLET | ORAL | Status: DC
Start: 2018-01-25 — End: 2018-01-24

## 2018-01-24 MED ORDER — OXYBUTYNIN CHLORIDE 5 MG PO TABS
5.00 | ORAL_TABLET | ORAL | Status: DC
Start: 2018-01-24 — End: 2018-01-24

## 2018-01-24 MED ORDER — HYDROCODONE-ACETAMINOPHEN 5-325 MG PO TABS
2.00 | ORAL_TABLET | ORAL | Status: DC
Start: ? — End: 2018-01-24

## 2018-01-24 NOTE — Telephone Encounter (Signed)
Please advise 

## 2018-01-24 NOTE — Telephone Encounter (Signed)
He should schedule an appointment with me when he is discharged and we can review his meds at that visit. Hopefully I will have a d/c summary from his current hospitalization.  Katina Degree. [name], MD 01/24/2018 2:18 PM

## 2018-01-24 NOTE — Telephone Encounter (Signed)
Copied from CRM [id number]. Topic: General - Other >> Jan 24, 2018 10:56 AM Debroah Loop wrote: Reason for CRM: Patient wants to notify Dr. [name] that the allopurinol (ZYLOPRIM) 100 MG tablet needs to be increased per [facility] where he is currently hospitalized.

## 2018-01-26 ENCOUNTER — Encounter: Payer: Self-pay | Admitting: *Deleted

## 2018-01-26 ENCOUNTER — Other Ambulatory Visit: Payer: Self-pay | Admitting: *Deleted

## 2018-01-26 NOTE — Patient Outreach (Signed)
[facility]) Care Management  01/26/2018  Jerry York 05/06/1951 [id number]   Referral date- 01/26/18 Referral source- [employer] Referral reason - Recent hospital admission/discharge  1/25 - 71/62.    67 year old male with recent planned admission for  TURP on 1/25, PMH includes but not limited to , Diabetes, Gout flares, BPH, chest pain, hypertension .   Successful outreach call to patient,HIPAA information verified, explained reason for the call, and [facility] care management services, patient verbalized being familiar with services due to previous follow up.   Patient discussed his recent admission for surgery , TURP, he currently has a foley catheter in place, denies increased discomfort, urine is clear, no bleeding noted . Patient discussed being familiar with having a catheter in place at home due to history of problems  with bladder emptying , he was doing self catheterizations prior to admissions.   Patient discussed have a gout flare left foot  while in patient and this being the most uncomfortable things, but it is getting better. Reports he seems to have a flare up of gout about every 2 weeks. He is getting around home okay, has a walker is needed and has his wife for support.  Patient discussed new prescription at discharge for indocin which he is taking 3 times a day , understands to take with food/meals.    Patient discussed having all of his medications prescribed and taking as prescribed, states he does not use a pill organizer,  patient is on greater than 10 medications, discussed our [facility] pharmacist  in reviewing medications , declines phone call for review, explained reason " he states what for' , he is doing okay with medications , denies having questions or cost concerns.  Patient discussed having Diabetes,and states he has a diabetic doctor reports his lastest A1c is in the 6 range. Patient checks his blood sugar 3 times a day and takes novolog  sliding scale as needed,recent blood sugar today was 130, he has had to take insulin once so far. Patient denies having low blood sugar episodes.  Discussed [facility] care management program, agreeable to care management services, but  patient declines need for home visits, he is agreeable to follow up telephone visit program.  Provided [facility] contact information, 24 nurse line as well as RNCM contact number.   Patient has scheduled PCP and urologist visit in the next 14 days.  Wife available to assist with transportation.    Plan Will send CMA message of patient case status  Will send PCP barrier involvement letter.  Will schedule next phone call in a week to complete assessments.    [facility] CM Care Plan Problem One     Most Recent Value  Care Plan Problem One  Recent hospital admission related to having TURP   Role Documenting the Problem One  Care Management Coordinator  Care Plan for Problem One  Active  [facility] Long Term Goal   Patient will not have a hospital readmission in the next 21 days   [facility] Long Term Goal Start Date  01/26/18  Interventions for Problem One Long Term Goal  Advised patient regarding taking medications as prescribed, notifying MD of new worsening concern regarding pain , bleeding   [facility] CM Short Term Goal #1   Patient will attend PCP visit in the next 14 days   [facility] CM Short Term Goal #1 Start Date  01/26/18  Interventions for Short Term Goal #1  Advised regarding importance of prompt  follow up with PCP , take medication bottles to visit, , reviewed transportation   [facility] CM Short Term Goal #2   Patient will be able to verbalize proper foley catheter care over the next 14 days   [facility] CM Short Term Goal #2 Start Date  01/26/18  Interventions for Short Term Goal #2  Discussed importance on daily cleaning foley site, notify MD of change of color of urine, any redness, cloudiness. reviewed keeping foley catheter off the floor.Egbert Garibaldi, RN, [facility] Care Management,Care  Management Coordinator  [phone]- Mobile [phone]- Toll Free Main Office

## 2018-01-27 NOTE — Telephone Encounter (Signed)
Appointment is scheduled for 02/07/2018.

## 2018-01-29 ENCOUNTER — Ambulatory Visit: Payer: PPO | Admitting: Cardiology

## 2018-01-30 ENCOUNTER — Ambulatory Visit: Payer: PPO | Admitting: Family Medicine

## 2018-02-02 ENCOUNTER — Other Ambulatory Visit: Payer: Self-pay | Admitting: *Deleted

## 2018-02-02 NOTE — Patient Outreach (Signed)
[facility]) Care Management  02/02/2018  Jerry York 08-23-1951 [id number]  Transition of care call   Successful outreach call to patient, HIPAA verified with patient.  Patient reports he is doing fairly well, reports gout has improved, completed indocin, reports tolerating mobility in home.  Patient discussed tolerating foley catheter, urine is clear, denies any new concerns.. Patient discussed upcoming PCP in next week.  Patient continues to monitor blood sugar reports average range 120-180.  Patient denies any new concerns at this time.   Plan Will plan follow up call for transition of care in the next week.   Egbert Garibaldi, RN, [facility] Care Management,Care Management Coordinator  [phone]- Mobile [phone]- Toll Free Main Office

## 2018-02-07 ENCOUNTER — Inpatient Hospital Stay: Payer: PPO | Admitting: Family Medicine

## 2018-02-07 DIAGNOSIS — R319 Hematuria, unspecified: Secondary | ICD-10-CM | POA: Diagnosis not present

## 2018-02-07 DIAGNOSIS — N400 Enlarged prostate without lower urinary tract symptoms: Secondary | ICD-10-CM | POA: Diagnosis not present

## 2018-02-07 DIAGNOSIS — Z48816 Encounter for surgical aftercare following surgery on the genitourinary system: Secondary | ICD-10-CM | POA: Diagnosis not present

## 2018-02-07 DIAGNOSIS — N390 Urinary tract infection, site not specified: Secondary | ICD-10-CM | POA: Diagnosis not present

## 2018-02-07 DIAGNOSIS — N1330 Unspecified hydronephrosis: Secondary | ICD-10-CM | POA: Diagnosis not present

## 2018-02-09 ENCOUNTER — Other Ambulatory Visit: Payer: Self-pay | Admitting: *Deleted

## 2018-02-09 ENCOUNTER — Encounter: Payer: Self-pay | Admitting: *Deleted

## 2018-02-09 NOTE — Patient Outreach (Signed)
[facility]) Care Management  02/09/2018  Oleg Oleson Melchor 07-06-1951 [id number]  Transition of care call  Successful telephone outreach to patient , HIPAA information verified.  Patient discussed that he is fair, beginning to get some of his strength back. He discussed gout is under control for now, tolerating mobility in home.  Patient discussed recent post op urology visit, foley catheter has been removed report urine color is clear and amount adequate, denies discomfort.  Patient continues to monitor blood sugar , reading today is 104, denies any symptoms of low blood sugar, discussed upcoming referral to diabetes management class.   Patient denies any other concerns at this time.   Plan Will plan final transition of care call in the next week.  Egbert Garibaldi, RN, [facility] Care Management,Care Management Coordinator  [phone]- Mobile [phone]- Toll Free Main Office

## 2018-02-12 ENCOUNTER — Ambulatory Visit (INDEPENDENT_AMBULATORY_CARE_PROVIDER_SITE_OTHER): Payer: PPO | Admitting: Cardiology

## 2018-02-12 ENCOUNTER — Encounter: Payer: Self-pay | Admitting: Cardiology

## 2018-02-12 VITALS — BP 100/67 | HR 99 | Ht 66.0 in | Wt 167.6 lb

## 2018-02-12 DIAGNOSIS — E781 Pure hyperglyceridemia: Secondary | ICD-10-CM

## 2018-02-12 DIAGNOSIS — R079 Chest pain, unspecified: Secondary | ICD-10-CM

## 2018-02-12 DIAGNOSIS — E1159 Type 2 diabetes mellitus with other circulatory complications: Secondary | ICD-10-CM

## 2018-02-12 DIAGNOSIS — I152 Hypertension secondary to endocrine disorders: Secondary | ICD-10-CM

## 2018-02-12 DIAGNOSIS — I10 Essential (primary) hypertension: Secondary | ICD-10-CM

## 2018-02-12 DIAGNOSIS — R600 Localized edema: Secondary | ICD-10-CM

## 2018-02-12 MED ORDER — LOSARTAN POTASSIUM 100 MG PO TABS: 50.0000 mg | ORAL_TABLET | Freq: Every day | ORAL | 3 refills | Status: DC

## 2018-02-12 MED ORDER — AMLODIPINE BESYLATE 10 MG PO TABS: 5.0000 mg | ORAL_TABLET | Freq: Every day | ORAL | 3 refills | Status: DC

## 2018-02-12 NOTE — Assessment & Plan Note (Signed)
Currently has had borderline hypotension. Apparently he is now taking losartan 100 mg along with his amlodipine 10 mg and metoprolol 100 mg twice a day. Plan:- For now, cut amlodipine and losartan dose in half and take them amlodipine in the evening with losartan in the morning. - Reduce Lasix to once a day

## 2018-02-12 NOTE — Assessment & Plan Note (Signed)
Chest discomfort he had was atypical in nature. Right-sided chest pain nonexertional. Normal 2-D echo and now normal coronary CTA with negative FFR of proximal LAD mild stenosis.  Continue risk factor modification, not likely to be anginal equivalent.

## 2018-02-12 NOTE — Progress Notes (Signed)
PCP: [name], MD  Clinic Note: Chief Complaint  Patient presents with  . Follow-up    Chest pain evaluation. Coronary CTA    MVH:QIONGE W Uzzle is a 67 y.o. male who is being seen today for follow-up evaluation of chest pain (preoperative risk stratification) at the request of [name], MD. Jayquon is a history of hypertension, diabetes mellitus type 2 and hyperlipidemia/hypertriglyceridemia with very poorly controlled triglycerides.  His father has a history of CAD with CABG in his 66s and then died in his 69s. He also has history of BPH and neurogenic bladder requiring in and out cathing.  He has had intermittent UTIs.  He also has gallstone disease.  November 16 by his PCP.  He noted intermittent episodes of chest discomfort occurring after walking.  Improves with stopping walking.  Right-sided chest pain is an uncomfortable sensation.  Not associated with dyspnea.    TUCKER MINTER was initially seen on Nov 27, 3017 -- he described right-sided chest pain underneath the rib cage. Not worse with exertion. Main risk factors include dyslipidemia with hypertriglyceridemia as well as hypertension and chronic low summary edema.  Recent Hospitalizations: Prostatectomy in January  Studies Personally Reviewed - (if available, images/films reviewed: From Epic Chart or Care Everywhere)  2D Echo Aug 2018: Normal LV size with mild LV hypertrophy. EF 60-65%. Aortic sclerosis without significant stenosis. Normal RV size and systolic function.  Coronary CTA 12/2017: Mild proximal LAD stenosis after D1. -- Negative FFR  Interval History: Amias is a very pleasant gentleman who returns here today following his prostate surgery. He has not had any further episodes of chest discomfort. We discussed results of his coronary CT angiogram. He was quite relieved with his result. He is not having any further chest tightness or pressure with rest or exertion. He denies any PND or orthopnea, but  does have lower extremity edema for which he is physicians of increased his Lasix to 80 mg twice a day. He notes over the last week or so, his blood pressure ranging from 80s to low 100s and he has been somewhat dizzy.  Also noticing his heart rate has been up higher than normal, but denies any irregularity to his heart rhythm. With his heart rate being higher and his blood pressure lower, he has had some mild exertional dyspnea.  He still notes some positional dizziness. No syncope/near syncope or TIA/amaurosis fugax symptoms.. No claudication.  ROS: A comprehensive was performed. Review of Systems  Constitutional: Positive for malaise/fatigue (Generalized). Negative for chills, fever and weight loss.  HENT: Negative for hearing loss.   Respiratory: Positive for shortness of breath (Some exertional shortness of breath). Negative for cough and wheezing.   Cardiovascular:       Fast heartbeat. Low blood pressure  Gastrointestinal: Negative for abdominal pain, constipation and heartburn.       Right upper quadrant pain  Genitourinary: Positive for hematuria (Probably related to having to do in out cath.-Recently had urologic surgery) and urgency. Negative for flank pain.       Neurogenic bladder  Musculoskeletal: Positive for joint pain (Occasional gout flare). Negative for falls.  Neurological: Positive for dizziness (Sometimes positional) and tingling (With some neuropathy pain).  Psychiatric/Behavioral: Negative for memory loss. The patient is not nervous/anxious and does not have insomnia.   All other systems reviewed and are negative.  I have reviewed and (if needed) personally updated the patient's problem list, medications, allergies, past medical and surgical history, social  and family history.   Past Medical History:  Diagnosis Date  . Anxiety   . Bilateral renal cysts   . Foley catheter in place   . Gout    02-22-2017 acute right foot gout---  per pt resolved  . Gross  hematuria   . Heart murmur   . Hydronephrosis, left   . Hyperlipidemia   . Hypertension   . Renal insufficiency   . Type 2 diabetes mellitus ([place])   . Urinary retention   . Wears glasses     Past Surgical History:  Procedure Laterality Date  . APPENDECTOMY  age 71  . CYSTOSCOPY WITH URETEROSCOPY AND STENT PLACEMENT Left 05/08/2017   Procedure: URETEROSCOPY AND STENT PLACEMENT;  Surgeon: Kathie Rhodes, MD;  Location: [facility];  Service: Urology;  Laterality: Left;  . CYSTOSCOPY/RETROGRADE/URETEROSCOPY Bilateral 05/08/2017   Procedure: CYSTOSCOPY/ BILATERAL RETROGRADE;  Surgeon: Kathie Rhodes, MD;  Location: [facility];  Service: Urology;  Laterality: Bilateral;  . INGUINAL HERNIA REPAIR Right 10/10/2000  . KNEE ARTHROSCOPY Right 1980's  . SHOULDER ARTHROSCOPY WITH OPEN ROTATOR CUFF REPAIR Right 1990's  . TRANSURETHRAL RESECTION OF PROSTATE      Current Meds  Medication Sig  . acetaminophen (TYLENOL) 325 MG tablet Take 650 mg by mouth every 6 (six) hours as needed for moderate pain.  [name] allopurinol (ZYLOPRIM) 100 MG tablet Take 1 tablet (100 mg total) by mouth daily.  [name] amLODipine (NORVASC) 10 MG tablet Take 0.5 tablets (5 mg total) by mouth daily.  [name] aspirin 325 MG tablet Take 325 mg by mouth 2 (two) times daily.   [name] atorvastatin (LIPITOR) 20 MG tablet Take 20 mg by mouth every evening.   [name] [name] Take 5,000 mg by mouth 2 (two) times daily.   . ciprofloxacin (CIPRO) 500 MG tablet Take 500 mg by mouth daily with breakfast.  . clonazePAM (KLONOPIN) 0.5 MG tablet Take 1 tablet (0.5 mg total) by mouth 2 (two) times daily.  . Coenzyme Q10 (COQ-10) 100 MG CAPS Take 1 capsule by mouth 2 (two) times daily.  . colchicine 0.6 MG tablet TAKE 1/2 (ONE-HALF) TABLET (0.3 MG TOTAL) BY MOUTH ONCE DAILY FOR GOUT FLARES  . diclofenac sodium (VOLTAREN) 1 % GEL Apply 4 g 4 (four) times daily topically.  . docusate sodium (COLACE) 100 MG capsule Take 100 mg by mouth  daily as needed for mild constipation. Takes 2 at bedtime daily as needed  . furosemide (LASIX) 80 MG tablet Take 1 tablet (80 mg total) by mouth daily.  [name] glucose blood ([name] TEST STRIPS) test strip Check blood sugars 2-3 times per day. DX: E11.9  . glucose monitoring kit ([name]) monitoring kit 1 each by Does not apply route as needed for other. DX Code: E11.9  . HYDROcodone-acetaminophen (NORCO/VICODIN) 5-325 MG tablet Take 1 tablet by mouth every 6 (six) hours as needed for moderate pain.  [name] insulin aspart (NOVOLOG) 100 UNIT/ML injection Inject 6-20 Units into the skin 3 (three) times daily with meals. Sliding scale  70-199, 6 units, 200-299, 8 units, 300-399 10 units, greater than 400 20 units.  [name] losartan (COZAAR) 100 MG tablet Take 0.5 tablets (50 mg total) by mouth daily.  . metFORMIN (GLUCOPHAGE) 1000 MG tablet Take 500 mg by mouth 2 (two) times daily with a meal. Patient reports taking 500 mg twice daily  . metoprolol (LOPRESSOR) 100 MG tablet Take 100 mg by mouth 2 (two) times daily.  . niacin 500 MG tablet Take 500  mg by mouth 2 (two) times daily after a meal.  . omega-3 acid ethyl esters (LOVAZA) 1 g capsule Take 1 capsule (1 g total) by mouth 2 (two) times daily.  [name] oxybutynin (DITROPAN) 5 MG tablet Take 5 mg by mouth 3 (three) times daily.  . repaglinide (PRANDIN) 0.5 MG tablet Take 0.5 mg by mouth 2 (two) times daily before a meal.  . tamsulosin (FLOMAX) 0.4 MG CAPS capsule Take 2 capsules (0.8 mg total) daily after supper by mouth.  . [DISCONTINUED] amLODipine (NORVASC) 10 MG tablet Take 1 tablet (10 mg total) daily by mouth.  . [DISCONTINUED] losartan (COZAAR) 100 MG tablet Take 100 mg by mouth daily.   - currently taking Lasix 80 mg twice a day and actually taking losartan 100 mg daily.  Allergies  Allergen Reactions  . Prednisone Nausea And Vomiting    Elevated blood sugar  . Sulfa Antibiotics Other (See Comments)    "make my feet burn"    Social History    Socioeconomic History  . Marital status: Married    Spouse name: None  . Number of children: None  . Years of education: None  . Highest education level: None  Social Needs  . Financial resource strain: None  . Food insecurity - worry: None  . Food insecurity - inability: None  . Transportation needs - medical: None  . Transportation needs - non-medical: None  Occupational History  . Occupation: Retired  Tobacco Use  . Smoking status: Never Smoker  . Smokeless tobacco: Never Used  Substance and Sexual Activity  . Alcohol use: No  . Drug use: No  . Sexual activity: Yes    Birth control/protection: Surgical    Comment: vasectomy  Other Topics Concern  . None  Social History Narrative  . None    family history includes Congestive Heart Failure in his mother; Coronary artery disease (age of onset: 67) in his father; Hyperlipidemia in his father and mother; Hypertension in his brother, daughter, father, mother, and son.  Wt Readings from Last 3 Encounters:  02/12/18 167 lb 9.6 oz (76 kg)  01/10/18 181 lb 3.2 oz (82.2 kg)  01/01/18 177 lb (80.3 kg)    PHYSICAL EXAM BP 100/67   Pulse 99   Ht '5\' 6"'$  (1.676 m)   Wt 167 lb 9.6 oz (76 kg)   BMI 27.05 kg/m  Physical Exam  Constitutional: He is oriented to person, place, and time. He appears well-developed and well-nourished. No distress.  Well-groomed  HENT:  Head: Normocephalic and atraumatic.  Neck: No hepatojugular reflux and no JVD present. Carotid bruit is not present.  Cardiovascular: Normal rate, regular rhythm, normal heart sounds and intact distal pulses. Exam reveals no gallop and no friction rub.  No murmur (Possible soft 0.5/6 SEM at RUSB.) heard. Nondisplaced PMI  Pulmonary/Chest: Effort normal and breath sounds normal. No respiratory distress. He has no wheezes. He has no rales.  Abdominal: Soft. Bowel sounds are normal. He exhibits no distension. There is no tenderness (Some right upper quadrant  tenderness). There is no rebound.  Musculoskeletal: Normal range of motion. He exhibits no edema (Trivial).  Neurological: He is alert and oriented to person, place, and time. No cranial nerve deficit.  Psychiatric: He has a normal mood and affect. His behavior is normal. Judgment and thought content normal.  Somewhat odd affect.  Normal mood.  Nursing note and vitals reviewed.   Adult ECG Report Not checked Other studies Reviewed: Additional studies/ records that were  reviewed today include:  Recent Labs:   In March 2018, triglycerides were 123. Lab Results  Component Value Date   CREATININE 1.20 01/10/2018   BUN 30 (H) 01/10/2018   NA 140 01/10/2018   K 3.6 01/10/2018   CL 104 01/10/2018   CO2 26 01/10/2018   Lab Results  Component Value Date   CHOL 111 08/14/2017   HDL 12.90 (L) 08/14/2017   LDLDIRECT 21.0 08/14/2017   TRIG (H) 08/14/2017    410.0 Triglyceride is over 400; calculations on Lipids are invalid.   CHOLHDL 9 08/14/2017    Lab Results  Component Value Date   HGBA1C 6.7 (H) 08/14/2017    ASSESSMENT / PLAN: Problem List Items Addressed This Visit    Chest pain with low risk for cardiac etiology - Primary    Chest discomfort he had was atypical in nature. Right-sided chest pain nonexertional. Normal 2-D echo and now normal coronary CTA with negative FFR of proximal LAD mild stenosis.  Continue risk factor modification, not likely to be anginal equivalent.      Hypertension associated with diabetes ([place]) (Chronic)    Currently has had borderline hypotension. Apparently he is now taking losartan 100 mg along with his amlodipine 10 mg and metoprolol 100 mg twice a day. Plan:- For now, cut amlodipine and losartan dose in half and take them amlodipine in the evening with losartan in the morning. - Reduce Lasix to once a day      Relevant Medications   amLODipine (NORVASC) 10 MG tablet   losartan (COZAAR) 100 MG tablet   Hypertriglyceridemia (Chronic)     Triglycerides remain elevated. Lipids are controlled atorvastatin along with CoQ10 niacin and Lovaza. Maintaining the consider switching from Lovaza to Vascepa.      Relevant Medications   amLODipine (NORVASC) 10 MG tablet   losartan (COZAAR) 100 MG tablet   Lower extremity edema    Would probably recommend reducing his Lasix back down to 80 once a day since he's had hypotension and some dizziness. Due to follow back up with PCP either this week or next week and reassess at that time.        Overall relatively stable from a cardiac standpoint. Essentially normal coronary CTA/negative stress test portion (negative FFR).  Can follow-up on a when necessary basis. Will defer further management of blood pressure to PCP.  If lipid management becomes more difficult, we can consider referral to our lipid clinic with Dr. Debara Pickett.  Current medicines are reviewed at length with the patient today. (+/- concerns) has been running relatively low blood pressures of late. Has been taking 80 mg twice a day Lasix for edema The following changes have been made:    Patient Instructions  MEDICATION CHANGES  ---DECREASE LASIX ( FUROSEMIDE) TO ONCE A DAY .  --- DECREASE  AMLODIPINE TO 1/2 TABLET OF 10 MG( 5 MG TOTAL)  DAILY . TAKE AT BEDTIME.  -----DECREASE LOSARTAN TO 1/2 TABLET OF 100 MG ( TOTAL OF 50 MG) IN THE MORNING DAILY.      FOLLOW UP WITH PRIMARY IN REGARDS TO BLOOD PRESSURE , IF PRIMARY WANTS OUR LIPID CLINIC CAN MANAGE LIPIDS (CHOLESTEROL LEVELS)    Your physician recommends that you schedule a follow-up appointment on an as needed basis. Primary will refill medications in the future    Studies Ordered:   No orders of the defined types were placed in this encounter.     Glenetta Hew, M.D., M.S. Interventional Cardiologist   Pager #  [phone] Phone # [phone] [address] [place], [place] [address]

## 2018-02-12 NOTE — Assessment & Plan Note (Signed)
Would probably recommend reducing his Lasix back down to 80 once a day since he's had hypotension and some dizziness. Due to follow back up with PCP either this week or next week and reassess at that time.

## 2018-02-12 NOTE — Patient Instructions (Addendum)
MEDICATION CHANGES  ---DECREASE LASIX ( FUROSEMIDE) TO ONCE A DAY .  --- DECREASE  AMLODIPINE TO 1/2 TABLET OF 10 MG( 5 MG TOTAL)  DAILY . TAKE AT BEDTIME.  -----DECREASE LOSARTAN TO 1/2 TABLET OF 100 MG ( TOTAL OF 50 MG) IN THE MORNING DAILY.      FOLLOW UP WITH PRIMARY IN REGARDS TO BLOOD PRESSURE , IF PRIMARY WANTS OUR LIPID CLINIC CAN MANAGE LIPIDS (CHOLESTEROL LEVELS)    Your physician recommends that you schedule a follow-up appointment on an as needed basis. Primary will refill medications in the future

## 2018-02-12 NOTE — Assessment & Plan Note (Signed)
Triglycerides remain elevated. Lipids are controlled atorvastatin along with CoQ10 niacin and Lovaza. Maintaining the consider switching from Lovaza to Vascepa.

## 2018-02-12 NOTE — Assessment & Plan Note (Signed)
Relatively reassuring coronary CTA with mild disease in the LAD and OM. Not physiologically significant by FFR. Continue risk factor modification with lipids, glycemic and blood pressure management.

## 2018-02-15 ENCOUNTER — Encounter: Payer: Self-pay | Admitting: Family Medicine

## 2018-02-15 ENCOUNTER — Ambulatory Visit (INDEPENDENT_AMBULATORY_CARE_PROVIDER_SITE_OTHER): Payer: PPO | Admitting: Family Medicine

## 2018-02-15 DIAGNOSIS — R600 Localized edema: Secondary | ICD-10-CM | POA: Diagnosis not present

## 2018-02-15 DIAGNOSIS — E1159 Type 2 diabetes mellitus with other circulatory complications: Secondary | ICD-10-CM | POA: Diagnosis not present

## 2018-02-15 DIAGNOSIS — M109 Gout, unspecified: Secondary | ICD-10-CM

## 2018-02-15 DIAGNOSIS — I10 Essential (primary) hypertension: Secondary | ICD-10-CM | POA: Diagnosis not present

## 2018-02-15 DIAGNOSIS — I152 Hypertension secondary to endocrine disorders: Secondary | ICD-10-CM

## 2018-02-15 MED ORDER — ALLOPURINOL 100 MG PO TABS: 200.0000 mg | ORAL_TABLET | Freq: Every day | ORAL | 3 refills | Status: DC

## 2018-02-15 NOTE — Assessment & Plan Note (Signed)
No current flares.  Increase allopurinol to 200 mg daily.  Follow-up in 3-6 months to recheck uric acid level.

## 2018-02-15 NOTE — Patient Instructions (Signed)
I am glad you are doing well.  We will increase your allopurinol to 200 mg daily.  We will not make any other change today.  Please come back to see me in 6 months, or sooner as needed.  Thank you, Dr. [name]

## 2018-02-15 NOTE — Progress Notes (Signed)
    Subjective:  Jerry York is a 67 y.o. male who presents today with a chief complaint of gout.    HPI:  Gout, Established problem, stable Patient currently on colchicine 0.6 mg daily and allopurinol 100 mg daily.  Patient was recently hospitalized for TURP procedure a couple weeks ago.  While hospitalized he had a gout flare in both of his feet bilaterally.  The gout flare has since resolved however patient reports that they checked a "gout level" and told him that he needed to increase his dose of allopurinol.  He has not had any issues since being discharged from the hospital.  No joint pain or swelling.  Hypertension, established problem, stable Recently saw his cardiologist who decreased his losartan to 50 mg and amlodipine to 5 mg daily.  He has done well with the decreased dose of both these medications.  No further episodes of chest pain or shortness of breath.  Lower extremity swelling is stable.  ROS: Per HPI  PMH: He reports that  has never smoked. he has never used smokeless tobacco. He reports that he does not drink alcohol or use drugs.   Objective:  Physical Exam: BP 118/70 (BP Location: Left Arm, Patient Position: Sitting, Cuff Size: Normal)   Pulse 76   Temp (!) 97.5 F (36.4 C) (Oral)   Ht 5\' 6"  (1.676 m)   Wt 170 lb 6.4 oz (77.3 kg)   SpO2 93%   BMI 27.50 kg/m    Wt Readings from Last 3 Encounters:  02/15/18 170 lb 6.4 oz (77.3 kg)  02/12/18 167 lb 9.6 oz (76 kg)  01/10/18 181 lb 3.2 oz (82.2 kg)  Gen: NAD, resting comfortably CV: RRR with 1 out of 6 systolic murmur. Pulm: NWOB, CTAB with no crackles, wheezes, or rhonchi Extremities: 1+ pitting edema to mid tibia bilaterally.  Compression stockings in place.  Assessment/Plan:  Gout No current flares.  Increase allopurinol to 200 mg daily.  Follow-up in 3-6 months to recheck uric acid level.  Hypertension associated with diabetes ([facility]) At goal today.  No symptomatic hypotensive episodes.  No  further changes need to his medications at this point.  He will follow-up with me in 3-6 months.  If continues to be at goal or hypotensive would consider stopping amlodipine completely.  Lower extremity edema Mild lower extremity edema.  He is actually down about 11 pounds over the past 4 weeks.  We will continue with Lasix 80 mg daily.  He will follow-up with me in 3-6 months.  As noted above, would consider stopping amlodipine in the near future if continues to be hypotensive or in normal range for his blood pressure as this may be contributing to his lower extremity edema.  Preventative healthcare We will obtain records for cologuard result.  Patient reports he had a normal test done last year.  01/12/18. Katina Degree, MD 02/15/2018 11:50 AM

## 2018-02-15 NOTE — Assessment & Plan Note (Signed)
At goal today.  No symptomatic hypotensive episodes.  No further changes need to his medications at this point.  He will follow-up with me in 3-6 months.  If continues to be at goal or hypotensive would consider stopping amlodipine completely.

## 2018-02-15 NOTE — Assessment & Plan Note (Signed)
Mild lower extremity edema.  He is actually down about 11 pounds over the past 4 weeks.  We will continue with Lasix 80 mg daily.  He will follow-up with me in 3-6 months.  As noted above, would consider stopping amlodipine in the near future if continues to be hypotensive or in normal range for his blood pressure as this may be contributing to his lower extremity edema.

## 2018-02-19 ENCOUNTER — Other Ambulatory Visit: Payer: Self-pay | Admitting: *Deleted

## 2018-02-19 ENCOUNTER — Encounter: Payer: Self-pay | Admitting: *Deleted

## 2018-02-19 NOTE — Patient Outreach (Signed)
[facility]) Care Management  02/19/2018  Jerry York Dec 22, 1951 [id number]   Final transition of care call  Successful outreach call to patient , HIPAA verified. Patient discussed that he is doing well, reports no recent gout flair,tolerating usual activity in home.  Patient reports urinating without any new concerns.  Patient continues to monitor blood pressure reading as home , discussed readings in his usual range.   Patient discussed recent PCP visit on last week.  Patient denies any new concerns. Goals have been met. Discussed case closure and patient agreeable.  He has [facility] contact information if future needs arise.  Plan  Will close case, will send CMA a in basket message goals have been met.  Will notify MD of case closure.    Joylene Draft, RN, [place] Management Coordinator  [phone]- Mobile [phone]- Toll Free Main Office

## 2018-02-21 ENCOUNTER — Encounter: Payer: PPO | Attending: Internal Medicine | Admitting: *Deleted

## 2018-02-21 DIAGNOSIS — M109 Gout, unspecified: Secondary | ICD-10-CM | POA: Diagnosis not present

## 2018-02-21 DIAGNOSIS — E119 Type 2 diabetes mellitus without complications: Secondary | ICD-10-CM | POA: Insufficient documentation

## 2018-02-21 DIAGNOSIS — Z713 Dietary counseling and surveillance: Secondary | ICD-10-CM | POA: Diagnosis not present

## 2018-02-21 DIAGNOSIS — E118 Type 2 diabetes mellitus with unspecified complications: Secondary | ICD-10-CM

## 2018-02-21 NOTE — Patient Instructions (Signed)
Plan:  Aim for 2-3 Carb Choices per meal (30-45 grams)  Aim for 0-1 Carbs per snack if hungry  Include protein in moderation with your meals and snacks Consider reading food labels for Total Carbohydrate of foods Consider  increasing your activity level by walking as tolerated or doing some Arm Chair Exercises for 5-15 minutes daily as tolerated Continue checking BG at alternate times per day as directed by MD  Consider taking medication insulin and Metformin as tolerated and as directed by MD

## 2018-02-22 ENCOUNTER — Other Ambulatory Visit: Payer: Self-pay

## 2018-02-22 MED ORDER — AMLODIPINE BESYLATE 5 MG PO TABS: 5.0000 mg | ORAL_TABLET | Freq: Every day | ORAL | 0 refills | Status: DC

## 2018-02-23 NOTE — Progress Notes (Signed)
Diabetes Self-Management Education  Visit Type: First/Initial  Appt. Start Time: 1100 Appt. End Time: 1230  02/23/2018  [name], identified by name and date of birth, is a 67 y.o. male with a diagnosis of Diabetes: Type 2. Patient states history of DM 2 since 1980! He states he was disabled at age 28 with a shoulder injury. His wife prepares some of his meals. When asked about his feeling of fear of his diabetes, he explained he knows people who have lost a leg or are on dialysis. I will explain risk of complications during our visit today.  ASSESSMENT  There were no vitals taken for this visit. There is no height or weight on file to calculate BMI.  Diabetes Self-Management Education - 02/23/18 1143      Visit Information   Visit Type  First/Initial      Initial Visit   Diabetes Type  Type 2    Are you currently following a meal plan?  No    Are you taking your medications as prescribed?  Yes    Date Diagnosed  1980      Health Coping   How would you rate your overall health?  Good      Psychosocial Assessment   Patient Belief/Attitude about Diabetes  Afraid    Self-care barriers  None    Other persons present  Patient    Patient Concerns  Nutrition/Meal planning;Glycemic Control    Special Needs  None    Learning Readiness  Change in progress    How often do you need to have someone help you when you read instructions, pamphlets, or other written materials from your doctor or pharmacy?  1 - Never    What is the last grade level you completed in school?  some college      Pre-Education Assessment   Patient understands the diabetes disease and treatment process.  Needs Review    Patient understands incorporating nutritional management into lifestyle.  Needs Instruction    Patient undertands incorporating physical activity into lifestyle.  Needs Review    Patient understands using medications safely.  Needs Review    Patient understands monitoring blood glucose,  interpreting and using results  Needs Review    Patient understands prevention, detection, and treatment of acute complications.  Needs Instruction    Patient understands prevention, detection, and treatment of chronic complications.  Needs Instruction    Patient understands how to develop strategies to address psychosocial issues.  Needs Instruction    Patient understands how to develop strategies to promote health/change behavior.  Needs Instruction      Complications   Last HgB A1C per patient/outside source  7.2 %    How often do you check your blood sugar?  3-4 times/day    Fasting Blood glucose range (mg/dL)  341-937    Postprandial Blood glucose range (mg/dL)  902-409    Number of hypoglycemic episodes per month  0    Have you had a dilated eye exam in the past 12 months?  Yes    Have you had a dental exam in the past 12 months?  No    Are you checking your feet?  Yes    How many days per week are you checking your feet?  7      Dietary Intake   Breakfast  instane oats and 2 tangerines    Lunch  soup lately, occasionally wiht crackers    Snack (afternoon)  fresh fruit or raw  vegetables    Dinner  soup again OR wife makes meat, with salad and occasionally a starch like quinoa    Beverage(s)  15 calorie lemonade and some water      Exercise   Exercise Type  Light (walking / raking leaves) mows about 3 days a week in the summer      Patient Education   Previous Diabetes Education  No    Disease state   Definition of diabetes, type 1 and 2, and the diagnosis of diabetes    Nutrition management   Role of diet in the treatment of diabetes and the relationship between the three main macronutrients and blood glucose level;Carbohydrate counting;Food label reading, portion sizes and measuring food.    Physical activity and exercise   Role of exercise on diabetes management, blood pressure control and cardiac health.    Medications  Reviewed patients medication for diabetes, action,  purpose, timing of dose and side effects.    Monitoring  Identified appropriate SMBG and/or A1C goals.    Acute complications  Taught treatment of hypoglycemia - the 15 rule.    Psychosocial adjustment  Role of stress on diabetes;Worked with patient to identify barriers to care and solutions      Individualized Goals (developed by patient)   Nutrition  Follow meal plan discussed    Physical Activity  Exercise 3-5 times per week    Medications  take my medication as prescribed    Monitoring   test blood glucose pre and post meals as discussed      Post-Education Assessment   Patient understands the diabetes disease and treatment process.  Demonstrates understanding / competency    Patient understands incorporating nutritional management into lifestyle.  Demonstrates understanding / competency    Patient undertands incorporating physical activity into lifestyle.  Demonstrates understanding / competency    Patient understands using medications safely.  Demonstrates understanding / competency    Patient understands monitoring blood glucose, interpreting and using results  Demonstrates understanding / competency    Patient understands prevention, detection, and treatment of acute complications.  Demonstrates understanding / competency    Patient understands prevention, detection, and treatment of chronic complications.  Demonstrates understanding / competency    Patient understands how to develop strategies to address psychosocial issues.  Demonstrates understanding / competency    Patient understands how to develop strategies to promote health/change behavior.  Demonstrates understanding / competency      Outcomes   Expected Outcomes  Demonstrated interest in learning. Expect positive outcomes    Future DMSE  PRN    Program Status  Completed       Individualized Plan for Diabetes Self-Management Training:   Learning Objective:  Patient will have a greater understanding of diabetes  self-management. Patient education plan is to attend individual and/or group sessions per assessed needs and concerns.   Plan:   Patient Instructions  Plan:  Aim for 2-3 Carb Choices per meal (30-45 grams)  Aim for 0-1 Carbs per snack if hungry  Include protein in moderation with your meals and snacks Consider reading food labels for Total Carbohydrate of foods Consider  increasing your activity level by walking as tolerated or doing some Arm Chair Exercises for 5-15 minutes daily as tolerated Continue checking BG at alternate times per day as directed by MD  Consider taking medication insulin and Metformin as tolerated and as directed by MD  Expected Outcomes:  Demonstrated interest in learning. Expect positive outcomes  Education material provided: A1C  conversion sheet, Meal plan card and Carbohydrate counting sheet, Insulin Action handout, Arm Chair Exercise handout  If problems or questions, patient to contact team via:  Phone  Future DSME appointment: PRN

## 2018-02-26 ENCOUNTER — Other Ambulatory Visit: Payer: Self-pay | Admitting: Family Medicine

## 2018-02-26 NOTE — Telephone Encounter (Signed)
Request for records from [facility], [facility], & [facility]

## 2018-02-27 NOTE — Telephone Encounter (Signed)
Please advise 

## 2018-03-19 DIAGNOSIS — R8290 Unspecified abnormal findings in urine: Secondary | ICD-10-CM | POA: Diagnosis not present

## 2018-03-19 DIAGNOSIS — N400 Enlarged prostate without lower urinary tract symptoms: Secondary | ICD-10-CM | POA: Diagnosis not present

## 2018-04-11 ENCOUNTER — Ambulatory Visit (INDEPENDENT_AMBULATORY_CARE_PROVIDER_SITE_OTHER): Payer: PPO | Admitting: Family Medicine

## 2018-04-11 ENCOUNTER — Encounter: Payer: Self-pay | Admitting: Family Medicine

## 2018-04-11 VITALS — BP 142/88 | HR 93 | Temp 97.8°F | Resp 18 | Ht 66.0 in | Wt 164.0 lb

## 2018-04-11 DIAGNOSIS — M109 Gout, unspecified: Secondary | ICD-10-CM

## 2018-04-11 DIAGNOSIS — R432 Parageusia: Secondary | ICD-10-CM | POA: Diagnosis not present

## 2018-04-11 MED ORDER — NYSTATIN 100000 UNIT/ML MT SUSP: 5.0000 mL | Freq: Four times a day (QID) | OROMUCOSAL | 0 refills | Status: AC

## 2018-04-11 MED ORDER — PANTOPRAZOLE SODIUM 40 MG PO TBEC: 40.0000 mg | DELAYED_RELEASE_TABLET | Freq: Every day | ORAL | 3 refills | Status: DC

## 2018-04-11 NOTE — Patient Instructions (Signed)
The most common reasons for bad taste in your mouth are infection and reflux.  We will start tretament for both of these today. Please start the nystatin and protonix.  Let me know if your symptoms are not improving over the next few weeks.  Take care, Dr [name]

## 2018-04-11 NOTE — Progress Notes (Signed)
   Subjective:  Jerry York is a 67 y.o. male who presents today with a chief complaint of dysgeusia.   HPI:  Dysgeusia, new problem Symptoms have been persistent for the past 2-3 months.  No clear precipitating events, however does note that symptoms worsened since having his urological procedure done 3 months ago.  Currently has a persistent bad taste in his mouth.  Denies any mouth pain or sores, however does note that he had a white plaque on his tongue a couple weeks ago that he was able to scrape off.  Occasionally has some nausea with belching.  No vomiting.  Occasional diarrhea on and off.  No abdominal pain.  No fevers or chills.  He has tried using Listerine which does not significantly seem to help.  He will be seeing a dentist later this year.  He has also stopped several of his medications which he thought may be contributing including his allopurinol.  None of this has seem to help.  No other obvious alleviating or aggravating factors.  Denies any reflux symptoms however does note some congestion upon waking in the morning.  Gout, established problem, stable As noted above, patient stopped both his allopurinol and his colchicine to see if this would help with his dysgeusia.  He has not had any gout flares since stopping medications about a month ago.  ROS: Per HPI  PMH: He reports that he has never smoked. He has never used smokeless tobacco. He reports that he does not drink alcohol or use drugs.  Objective:  Physical Exam: BP (!) 142/88 Comment: Pt has not taken meds  Pulse 93   Temp 97.8 F (36.6 C)   Resp 18   Ht 5\' 6"  (1.676 m)   Wt 164 lb (74.4 kg)   SpO2 98%   BMI 26.47 kg/m   Gen: NAD, resting comfortably HEENT: -Mouth: Tacky appearing mucous membranes.  Yellowish white patch noted on tongue.  No open sores or lesions noted.  No tonsil stones noted. CV: RRR with no murmurs appreciated Pulm: NWOB, CTAB with no crackles, wheezes, or rhonchi GI: Normal bowel  sounds present. Soft, Nontender, Nondistended.  Assessment/Plan:  Dysgeusia Unclear etiology.  He does have some white patches on his tongue that may be consistent with thrush.  We will start nystatin swish and swallow to treat this.  We will also start Protonix to treat any possible underlying GERD.  He will follow-up with me in a few weeks.  Consider empiric treatment of sinusitis/rhinitis if no improvement.  May ultimately need referral to ENT or dentistry if no improvement.  Gout Stable off meds.  Discussed potentially restarting allopurinol and colchicine given that he has not had any improvement in his dysgeusia off of these medications, however patient deferred.  We will readdress this at her next office visit.  . Katina Degree, MD 04/11/2018 9:34 AM

## 2018-04-11 NOTE — Assessment & Plan Note (Signed)
Stable off meds.  Discussed potentially restarting allopurinol and colchicine given that he has not had any improvement in his dysgeusia off of these medications, however patient deferred.  We will readdress this at her next office visit.

## 2018-04-12 DIAGNOSIS — E119 Type 2 diabetes mellitus without complications: Secondary | ICD-10-CM | POA: Diagnosis not present

## 2018-04-12 DIAGNOSIS — Z7984 Long term (current) use of oral hypoglycemic drugs: Secondary | ICD-10-CM | POA: Diagnosis not present

## 2018-04-12 DIAGNOSIS — E785 Hyperlipidemia, unspecified: Secondary | ICD-10-CM | POA: Diagnosis not present

## 2018-04-12 DIAGNOSIS — M109 Gout, unspecified: Secondary | ICD-10-CM | POA: Diagnosis not present

## 2018-04-12 DIAGNOSIS — Z5181 Encounter for therapeutic drug level monitoring: Secondary | ICD-10-CM | POA: Diagnosis not present

## 2018-04-13 ENCOUNTER — Telehealth: Payer: Self-pay | Admitting: Family Medicine

## 2018-04-13 NOTE — Telephone Encounter (Signed)
Copied from CRM [id number]. Topic: Quick Communication - See Telephone Encounter >> Apr 13, 2018  8:41 AM Cipriano Bunker wrote: CRM for notification.   Dr. [name] - Creatinine was 3.07 and 87 Bun and was suggested U/S in hospital.  Asking Dr. [name] would set this up as his kidney doctor out of town for 2 weeks.  See Telephone encounter for: 04/13/18.

## 2018-04-16 ENCOUNTER — Other Ambulatory Visit: Payer: Self-pay | Admitting: Internal Medicine

## 2018-04-16 DIAGNOSIS — Z7984 Long term (current) use of oral hypoglycemic drugs: Secondary | ICD-10-CM | POA: Diagnosis not present

## 2018-04-16 DIAGNOSIS — E119 Type 2 diabetes mellitus without complications: Secondary | ICD-10-CM | POA: Diagnosis not present

## 2018-04-16 DIAGNOSIS — N289 Disorder of kidney and ureter, unspecified: Secondary | ICD-10-CM

## 2018-04-16 DIAGNOSIS — R944 Abnormal results of kidney function studies: Secondary | ICD-10-CM | POA: Diagnosis not present

## 2018-04-16 NOTE — Telephone Encounter (Signed)
Looks like renal US has already been ordered - not sure if we need to do anything else.  Katina Degree. [name], MD 04/16/2018 4:51 PM

## 2018-04-16 NOTE — Telephone Encounter (Signed)
Please advise/ result note

## 2018-04-23 ENCOUNTER — Ambulatory Visit
Admission: RE | Admit: 2018-04-23 | Discharge: 2018-04-23 | Disposition: A | Discharge status: Home / Self Care | Payer: PPO | Source: Ambulatory Visit | Attending: Internal Medicine | Admitting: Internal Medicine

## 2018-04-23 DIAGNOSIS — N281 Cyst of kidney, acquired: Secondary | ICD-10-CM | POA: Diagnosis not present

## 2018-04-23 DIAGNOSIS — N289 Disorder of kidney and ureter, unspecified: Secondary | ICD-10-CM

## 2018-04-23 DIAGNOSIS — R944 Abnormal results of kidney function studies: Secondary | ICD-10-CM | POA: Diagnosis not present

## 2018-05-10 ENCOUNTER — Other Ambulatory Visit: Payer: Self-pay

## 2018-05-10 MED ORDER — ATORVASTATIN CALCIUM 20 MG PO TABS: 20.0000 mg | ORAL_TABLET | Freq: Every evening | ORAL | 1 refills | Status: DC

## 2018-05-16 DIAGNOSIS — N400 Enlarged prostate without lower urinary tract symptoms: Secondary | ICD-10-CM | POA: Diagnosis not present

## 2018-05-16 DIAGNOSIS — R8290 Unspecified abnormal findings in urine: Secondary | ICD-10-CM | POA: Diagnosis not present

## 2018-05-16 DIAGNOSIS — N1330 Unspecified hydronephrosis: Secondary | ICD-10-CM | POA: Diagnosis not present

## 2018-05-24 ENCOUNTER — Other Ambulatory Visit: Payer: Self-pay

## 2018-05-24 ENCOUNTER — Other Ambulatory Visit: Payer: Self-pay | Admitting: Family Medicine

## 2018-05-24 MED ORDER — METOPROLOL TARTRATE 100 MG PO TABS: 100.0000 mg | ORAL_TABLET | Freq: Two times a day (BID) | ORAL | 0 refills | Status: DC

## 2018-06-11 ENCOUNTER — Encounter: Payer: Self-pay | Admitting: Family Medicine

## 2018-06-11 ENCOUNTER — Ambulatory Visit (INDEPENDENT_AMBULATORY_CARE_PROVIDER_SITE_OTHER): Payer: PPO | Admitting: Family Medicine

## 2018-06-11 DIAGNOSIS — R600 Localized edema: Secondary | ICD-10-CM

## 2018-06-11 DIAGNOSIS — M109 Gout, unspecified: Secondary | ICD-10-CM | POA: Diagnosis not present

## 2018-06-11 DIAGNOSIS — E118 Type 2 diabetes mellitus with unspecified complications: Secondary | ICD-10-CM

## 2018-06-11 DIAGNOSIS — E1159 Type 2 diabetes mellitus with other circulatory complications: Secondary | ICD-10-CM | POA: Diagnosis not present

## 2018-06-11 DIAGNOSIS — E1169 Type 2 diabetes mellitus with other specified complication: Secondary | ICD-10-CM

## 2018-06-11 DIAGNOSIS — E785 Hyperlipidemia, unspecified: Secondary | ICD-10-CM | POA: Diagnosis not present

## 2018-06-11 DIAGNOSIS — I152 Hypertension secondary to endocrine disorders: Secondary | ICD-10-CM

## 2018-06-11 DIAGNOSIS — I10 Essential (primary) hypertension: Secondary | ICD-10-CM

## 2018-06-11 DIAGNOSIS — F419 Anxiety disorder, unspecified: Secondary | ICD-10-CM | POA: Diagnosis not present

## 2018-06-11 MED ORDER — FUROSEMIDE 80 MG PO TABS: 80.0000 mg | ORAL_TABLET | Freq: Two times a day (BID) | ORAL | 3 refills | Status: DC

## 2018-06-11 MED ORDER — FREESTYLE SYSTEM KIT: 1.0000 | PACK | 0 refills | Status: DC | PRN

## 2018-06-11 MED ORDER — CLONAZEPAM 0.5 MG PO TABS: 0.5000 mg | ORAL_TABLET | Freq: Two times a day (BID) | ORAL | 1 refills | Status: DC

## 2018-06-11 NOTE — Assessment & Plan Note (Signed)
Stable.  Klonopin was refilled today.  Follow-up in 6 months.

## 2018-06-11 NOTE — Assessment & Plan Note (Addendum)
Stable off medications.  Consider checking uric acid with next blood draw.

## 2018-06-11 NOTE — Progress Notes (Signed)
   Subjective:  Jerry York is a 67 y.o. male who presents today with a chief complaint of anxiety follow-up.   HPI:  Anxiety, chronic problem, stable Patient currently on Klonopin 0.5 mg twice daily.  Tolerates this well without side effects.  Symptoms are well controlled.  Gout, chronic problem, stable Patient taken off all this medication several months ago.  He has not had any recurrent flare since then.  Type 2 diabetes, chronic problem, stable Calls with Dr. Sharl Ma for this.  Currently on repaglinide 1 mg daily and is stable.  Sugars have been well controlled.  He needs a new glucometer today.  Hypertension, chronic problem, stable Currently on Norvasc 5 mg daily, metoprolol 100 mg twice daily.  Tolerates both these well without side effects.  No reported chest pain or shortness of breath.  Lower extremity edema, chronic problem, stable Takes Lasix 80 mg twice a day.  Has a little bit of swelling in his legs today, but this is at his baseline.  ROS: Per HPI  PMH: He reports that he has never smoked. He has never used smokeless tobacco. He reports that he does not drink alcohol or use drugs.  Objective:  Physical Exam: BP 118/64 (BP Location: Left Arm, Patient Position: Sitting, Cuff Size: Normal)   Pulse 67   Temp (!) 97.5 F (36.4 C) (Oral)   Ht 5\' 6"  (1.676 m)   Wt 180 lb 3.2 oz (81.7 kg)   SpO2 97%   BMI 29.09 kg/m   Gen: NAD, resting comfortably CV: RRR with no murmurs appreciated Pulm: NWOB, CTAB with no crackles, wheezes, or rhonchi MSK: Trace pretibial edema bilaterally. Skin: Warm, dry Neuro: Grossly normal, moves all extremities Psych: Normal affect and thought content  Assessment/Plan:  Anxiety Stable.  Klonopin was refilled today.  Follow-up in 6 months.   Gout Stable off medications.  Consider checking uric acid with next blood draw.  Type 2 diabetes mellitus (HCC) Doing well on current medications.  He will follow-up with his endocrinologist  next month.  Will need repeat A1c at that time.  We will provide him with a new prescription for glucometer today.  Hypertension associated with diabetes (HCC) At goal on current medications.  Continue Norvasc 5 mg daily and metoprolol tartrate 100 mg twice daily.  Lower extremity edema Stable.  Continue Lasix 80 mg twice daily.  Will need BMET with next blood draw.  Hyperlipidemia associated with type 2 diabetes mellitus (HCC) Stable.  Continue atorvastatin 20 mg daily.  Check lipid panel with next blood draw.  [facility] Will obtain records for his most recent cologuard.   . Katina Degree, MD 06/11/2018 10:17 AM

## 2018-06-11 NOTE — Assessment & Plan Note (Signed)
Stable.  Continue atorvastatin 20 mg daily.  Check lipid panel with next blood draw.

## 2018-06-11 NOTE — Assessment & Plan Note (Signed)
At goal on current medications.  Continue Norvasc 5 mg daily and metoprolol tartrate 100 mg twice daily.

## 2018-06-11 NOTE — Patient Instructions (Signed)
It was very nice to see you today!  I am glad that things are going well!  We will not make any medication changes today.  I will send in a prescription for your Klonopin, Lasix, and for a blood sugar monitor.  Please come back to see me in January for your annual visit with blood work.  We can do this is a physical if you would like.  We will get records for your most recent Cologuard.  Come back sooner if you need anything else.  Take care, Dr [name]

## 2018-06-11 NOTE — Assessment & Plan Note (Signed)
Stable.  Continue Lasix 80 mg twice daily.  Will need BMET with next blood draw.

## 2018-06-11 NOTE — Assessment & Plan Note (Signed)
Doing well on current medications.  He will follow-up with his endocrinologist next month.  Will need repeat A1c at that time.  We will provide him with a new prescription for glucometer today.

## 2018-06-12 ENCOUNTER — Other Ambulatory Visit: Payer: Self-pay

## 2018-06-12 MED ORDER — FREESTYLE LIBRE READER DEVI: 1.0000 [IU] | Freq: Three times a day (TID) | 0 refills | Status: DC

## 2018-06-12 MED ORDER — FREESTYLE LIBRE 14 DAY SENSOR MISC: 1.0000 [IU] | 11 refills | Status: DC

## 2018-07-11 ENCOUNTER — Other Ambulatory Visit: Payer: Self-pay | Admitting: Family Medicine

## 2018-07-11 ENCOUNTER — Encounter: Payer: Self-pay | Admitting: Physical Therapy

## 2018-07-17 DIAGNOSIS — N400 Enlarged prostate without lower urinary tract symptoms: Secondary | ICD-10-CM | POA: Diagnosis not present

## 2018-07-18 DIAGNOSIS — M109 Gout, unspecified: Secondary | ICD-10-CM | POA: Diagnosis not present

## 2018-07-18 DIAGNOSIS — E785 Hyperlipidemia, unspecified: Secondary | ICD-10-CM | POA: Diagnosis not present

## 2018-07-18 DIAGNOSIS — Z7984 Long term (current) use of oral hypoglycemic drugs: Secondary | ICD-10-CM | POA: Diagnosis not present

## 2018-07-18 DIAGNOSIS — E119 Type 2 diabetes mellitus without complications: Secondary | ICD-10-CM | POA: Diagnosis not present

## 2018-07-18 DIAGNOSIS — Z79899 Other long term (current) drug therapy: Secondary | ICD-10-CM | POA: Diagnosis not present

## 2018-07-24 DIAGNOSIS — N400 Enlarged prostate without lower urinary tract symptoms: Secondary | ICD-10-CM | POA: Diagnosis not present

## 2018-08-22 ENCOUNTER — Other Ambulatory Visit: Payer: Self-pay | Admitting: Family Medicine

## 2018-08-23 ENCOUNTER — Other Ambulatory Visit: Payer: Self-pay | Admitting: Family Medicine

## 2018-08-23 ENCOUNTER — Telehealth: Payer: Self-pay | Admitting: Family Medicine

## 2018-08-23 MED ORDER — METOPROLOL TARTRATE 100 MG PO TABS: 100.0000 mg | ORAL_TABLET | Freq: Two times a day (BID) | ORAL | 0 refills | Status: DC

## 2018-08-23 NOTE — Telephone Encounter (Signed)
Copied from CRM [id number]. Topic: Quick Communication - Rx Refill/Question >> Aug 23, 2018 12:42 PM Laural Benes, [place] C wrote: Medication: metoprolol tartrate (LOPRESSOR) 100 MG tablet   Has the patient contacted their pharmacy? No  (Agent: If no, request that the patient contact the pharmacy for the refill.) (Agent: If yes, when and what did the pharmacy advise?)  Preferred Pharmacy (with phone number or street name): [facility] [address] [place] - [address] Sherian Maroon [phone] (Phone) [phone] (Fax)    Agent: Please be advised that RX refills may take up to 3 business days. We ask that you follow-up with your pharmacy.

## 2018-09-02 ENCOUNTER — Other Ambulatory Visit: Payer: Self-pay | Admitting: Family Medicine

## 2018-09-17 DIAGNOSIS — M19071 Primary osteoarthritis, right ankle and foot: Secondary | ICD-10-CM | POA: Diagnosis not present

## 2018-10-12 ENCOUNTER — Encounter: Payer: Self-pay | Admitting: Family Medicine

## 2018-10-12 ENCOUNTER — Ambulatory Visit (INDEPENDENT_AMBULATORY_CARE_PROVIDER_SITE_OTHER): Payer: PPO | Admitting: *Deleted

## 2018-10-12 DIAGNOSIS — Z23 Encounter for immunization: Secondary | ICD-10-CM

## 2018-10-16 ENCOUNTER — Other Ambulatory Visit: Payer: Self-pay

## 2018-10-16 NOTE — Patient Outreach (Signed)
[facility]) Care Management  10/16/2018  SHAQUILE LUTZE [birth year] [id number]   Telephone Screen  Referral Date: 10/15/18 Referral Source: [employer]TA Concierge Referral Reason: " member has fallen into coverage gap with his Novolog, member states he is not able to afford insulin" Insurance: HTA   Outreach attempt # 1 to patient. No answer at present. RN CM left HIPAA compliant voicemail message along with contact info.       Plan: RN CM will make outreach attempt to patient within 3-4 business days. RN CM will send unsuccessful outreach letter to patient.    [name], RN,BSN,CCM [facility] Care Management Telephonic Care Management Coordinator Direct Phone: [phone] Toll Free: [phone] Fax: [phone]

## 2018-10-18 ENCOUNTER — Other Ambulatory Visit: Payer: Self-pay

## 2018-10-18 NOTE — Patient Outreach (Signed)
[facility]) Care Management  10/18/2018  Jerry York 04/23/1951 [id number]   Telephone Screen  Referral Date: 10/15/18 Referral Source: HTA Concierge Referral Reason: " member has fallen into coverage gap with his Novolog, member states he is not able to afford insulin" Insurance: HTA   Outreach attempt #2 to patient. No answer at present.     Plan: RN CM will make outreach attempt to patient within 3-4 business days.   [name], RN,BSN,CCM [facility] Care Management Telephonic Care Management Coordinator Direct Phone: [phone] Toll Free: [phone] Fax: [phone]

## 2018-10-22 ENCOUNTER — Other Ambulatory Visit: Payer: Self-pay

## 2018-10-22 NOTE — Patient Outreach (Signed)
[facility]) Care Management  10/22/2018  Jerry York 1951/07/11 [id number]   Telephone Screen  Referral Date: 10/15/18 Referral Source: HTA Concierge Referral Reason: " member has fallen into coverage gap with his Novolog, member states he is not able to afford insulin" Insurance: HTA   Outreach attempt #3 to patient. Spoke with patient. He lives in the home along with his spouse. He is independent with ADLs/IADLs. He denies any recent falls. No issues with transportation reported. Patient has PMH of neurogenic bladder, DM, gout, HLD and HTN. He voices that he is managing his conditions well. He denies needing any further education and/or support in managing conditions. He voices that he is taking about 15 meds and recently got into coverage gap within the past month. He states that the only med he is having trouble affording is his insulin-Novolog. He has notified prescribing MD(Dr. Sharl Ma). Patient states that he was told by MD that they do not provide any samples of meds. He states that he has about half a vial left of med. [facility] services reviewed and discussed with patient. Patient gave verbal consent for services and denies needing any other services except pharmacy assistance at this time.       Plan: RN CM will send [facility] pharmacy referral to patient for possible med assistance.    [name], RN,BSN,CCM [facility] Care Management Telephonic Care Management Coordinator Direct Phone: [phone] Toll Free: [phone] Fax: [phone]

## 2018-10-23 ENCOUNTER — Other Ambulatory Visit: Payer: Self-pay | Admitting: Pharmacist

## 2018-10-23 DIAGNOSIS — E785 Hyperlipidemia, unspecified: Secondary | ICD-10-CM | POA: Diagnosis not present

## 2018-10-23 DIAGNOSIS — Z794 Long term (current) use of insulin: Secondary | ICD-10-CM | POA: Diagnosis not present

## 2018-10-23 DIAGNOSIS — E119 Type 2 diabetes mellitus without complications: Secondary | ICD-10-CM | POA: Diagnosis not present

## 2018-10-23 DIAGNOSIS — Z79899 Other long term (current) drug therapy: Secondary | ICD-10-CM | POA: Diagnosis not present

## 2018-10-23 LAB — BASIC METABOLIC PANEL
BUN: 37 — AB (ref 4–21)
CO2: 30 — AB (ref 13–22)
Chloride: 101 (ref 99–108)
Creatinine: 1.5 — AB (ref 0.6–1.3)
Glucose: 144
Potassium: 4 (ref 3.4–5.3)
Sodium: 140 (ref 137–147)

## 2018-10-23 LAB — COMPREHENSIVE METABOLIC PANEL
Calcium: 9.7 (ref 8.7–10.7)
GFR calc Af Amer: 56
GFR calc non Af Amer: 46

## 2018-10-23 NOTE — Patient Outreach (Addendum)
[facility]) Care Management  [name]   10/23/2018  Jerry York 1951/12/14 [id number]  Reason for referral: medication assistance  Referral source: HTA Concierge Referral medication(s): Novolog Current insurance:HTA  Unsuccessful telephone call attempt #1 to patient.   HIPAA compliant voicemail left requesting a return call  Plan:  I will mail patient an unsuccessful outreach letter, I will make another outreach attempt to patient within 3-4 business days  [name], PharmD, [place] [phone]    Addendum: Incoming call from [name]. HIPAA identifiers verified. Patient agreeable to review medications telephonically.  He reports he has an office visit with his endocrinologist today.  He is now in the coverage gap and Novolog vial co-pay increased from $45 / month to $87 / month.  Patient reports he previously applied for Extra Help LIS but was denied.    Objective: Allergies  Allergen Reactions  . Prednisone Nausea And Vomiting    Elevated blood sugar  . Sulfa Antibiotics Other (See Comments)    "make my feet burn"    Medications Reviewed Today    Reviewed by [name], MD (Physician) on 06/11/18 at 1012  Med List Status: <None>  Medication Order Taking? Sig Documenting Provider Last Dose Status Informant  acetaminophen (TYLENOL) 325 MG tablet [id number] Yes Take 650 mg by mouth every 6 (six) hours as needed for moderate pain. [provider] Taking Active Self  amLODipine (NORVASC) 5 MG tablet [id number] Yes TAKE 1 TABLET BY MOUTH ONCE DAILY [name], MD Taking Active   atorvastatin (LIPITOR) 20 MG tablet [id number] Yes Take 1 tablet (20 mg total) by mouth every evening. [name], MD Taking Active   [name] [id number] Yes Take 5,000 mg by mouth 2 (two) times daily.  [provider] Taking Active Self  clonazePAM (KLONOPIN) 0.5 MG tablet [id number]  Yes Take 1 tablet (0.5 mg total) by mouth 2 (two) times daily. [name], MD  Active   Coenzyme Q10 (COQ-10) 100 MG CAPS [id number] Yes Take 1 capsule by mouth 2 (two) times daily. [provider] Taking Active Self  furosemide (LASIX) 80 MG tablet [id number] Yes Take 1 tablet (80 mg total) by mouth 2 (two) times daily. [name], MD  Active   glucose blood ([name] TEST STRIPS) test strip [id number] Yes Check blood sugars 2-3 times per day. DX: E11.9 [name], MD Taking Active   glucose monitoring kit ([name]) monitoring kit [id number] Yes 1 each by Does not apply route as needed for other. DX Code: E11.9 [name], MD  Active   insulin aspart (NOVOLOG) 100 UNIT/ML injection [id number] Yes Inject 6-20 Units into the skin 3 (three) times daily with meals. Sliding scale  70-199, 6 units, 200-299, 8 units, 300-399 10 units, greater than 400 20 units. [provider] Taking Active   metoprolol tartrate (LOPRESSOR) 100 MG tablet [id number] Yes Take 1 tablet (100 mg total) by mouth 2 (two) times daily. [name], MD Taking Active   niacin 500 MG tablet [id number] Yes Take 500 mg by mouth 2 (two) times daily after a meal. [provider] Taking Active Self  omega-3 acid ethyl esters (LOVAZA) 1 g capsule [id number] Yes Take 1 capsule (1 g total) by mouth 2 (two) times daily. [name], MD Taking Active   pantoprazole (PROTONIX) 40 MG tablet [id number] Yes Take 1 tablet (40 mg total) by mouth daily. [name],  MD Taking Active   repaglinide (PRANDIN) 1 MG tablet [id number]  Take 1 mg by mouth daily. [provider]  Active           Assessment:  Drugs sorted by system:  Neurologic/Psychologic: clonazepam  Cardiovascular: amlodipine, atorvastatin, furosemide, metoprolol, niacin, omega-3 acid ethyl esters  Endocrine: insulin aspart  Pain: acetaminophen  Vitamins/Minerals/Supplements: cinnamon, CoQ-10  Medication Review  Findings:  No issues  Medication Assistance Findings:  Extra Help:   _0  Already receiving Full Extra Help  _1  Already receiving Partial Extra Help  _2  Eligible based on reported income and assets  _3  Not Eligible based on reported income and assets  Patient Assistance Programs: 1) Novolog made by [place] requirement met: _4  Yes _5  No _6  Unknown o Out-of-pocket prescription expenditure met:    _7  Yes _8  No  _9  Unknown  <[id number]>_8<\/[id number]>_37  Not applicable - Patient has not met TROOP yet to be eligible.  - May consider applying for Humalog via [employer] with letter of financial hardship to override TROOP.    Additional medication assistance options reviewed with patient as warranted:  Coupon and Agricultural consultant - Novolin / Relion brand via [employer] also reviewed with patient.    Plan: Patient will discuss insulin options with endocrinologist today.  I will follow-up with him tomorrow regarding decision.   [name], PharmD, [place] [phone]

## 2018-10-23 NOTE — Addendum Note (Signed)
Addended by: Haynes Hoehn E on: 10/23/2018 01:19 PM   Modules accepted: Orders

## 2018-10-24 ENCOUNTER — Other Ambulatory Visit: Payer: Self-pay | Admitting: Pharmacist

## 2018-10-24 ENCOUNTER — Ambulatory Visit: Payer: Self-pay | Admitting: Pharmacist

## 2018-10-24 NOTE — Patient Outreach (Signed)
[facility]) Care Management  [place] 10/24/2018  STEAVEN WHOLEY September 21, 1951 [id number]  Reason for call: f/u on patient assistance with Novolog  Successful call to Mr. Bellotti.  Patient reports he discussed his insulin with his endocrinologist and decision made to change to Novolin which he can purchase through [place].  Patient able to use vial and syringe.  We reviewed pharmacokinetic changes between Novolog and Novolin.  Patient voiced understanding.  He does not have any other medication related questions at this time.  I provided my phone number to patient if he needs to reach out to me in in the future.   [facility] pharmacy case is being closed due to the following reasons:  Goals have been met.  Thank you for allowing [facility] pharmacy to be involved in this patient's care.    [name], PharmD, [place] [phone]

## 2018-10-30 ENCOUNTER — Ambulatory Visit: Payer: PPO | Admitting: Pharmacist

## 2018-11-02 ENCOUNTER — Emergency Department (HOSPITAL_COMMUNITY): Payer: PPO

## 2018-11-02 ENCOUNTER — Ambulatory Visit: Payer: PPO | Admitting: Family Medicine

## 2018-11-02 ENCOUNTER — Other Ambulatory Visit: Payer: Self-pay

## 2018-11-02 ENCOUNTER — Encounter (HOSPITAL_COMMUNITY): Payer: Self-pay

## 2018-11-02 ENCOUNTER — Emergency Department (HOSPITAL_BASED_OUTPATIENT_CLINIC_OR_DEPARTMENT_OTHER)
Admission: EM | Admit: 2018-11-02 | Discharge: 2018-11-02 | Disposition: A | Discharge status: Home / Self Care | Payer: PPO | Source: Home / Self Care

## 2018-11-02 ENCOUNTER — Emergency Department (EMERGENCY_DEPARTMENT_HOSPITAL)
Admission: EM | Admit: 2018-11-02 | Discharge: 2018-11-02 | Disposition: A | Discharge status: Home / Self Care | Payer: PPO | Source: Home / Self Care | Attending: Emergency Medicine | Admitting: Emergency Medicine

## 2018-11-02 DIAGNOSIS — M79671 Pain in right foot: Secondary | ICD-10-CM | POA: Diagnosis not present

## 2018-11-02 DIAGNOSIS — E785 Hyperlipidemia, unspecified: Secondary | ICD-10-CM | POA: Diagnosis present

## 2018-11-02 DIAGNOSIS — R262 Difficulty in walking, not elsewhere classified: Secondary | ICD-10-CM | POA: Diagnosis present

## 2018-11-02 DIAGNOSIS — S61411A Laceration without foreign body of right hand, initial encounter: Secondary | ICD-10-CM

## 2018-11-02 DIAGNOSIS — R011 Cardiac murmur, unspecified: Secondary | ICD-10-CM | POA: Diagnosis present

## 2018-11-02 DIAGNOSIS — M109 Gout, unspecified: Secondary | ICD-10-CM | POA: Diagnosis present

## 2018-11-02 DIAGNOSIS — N182 Chronic kidney disease, stage 2 (mild): Secondary | ICD-10-CM | POA: Diagnosis present

## 2018-11-02 DIAGNOSIS — M79641 Pain in right hand: Secondary | ICD-10-CM | POA: Diagnosis not present

## 2018-11-02 DIAGNOSIS — M79672 Pain in left foot: Secondary | ICD-10-CM | POA: Diagnosis not present

## 2018-11-02 DIAGNOSIS — R000 Tachycardia, unspecified: Secondary | ICD-10-CM | POA: Diagnosis not present

## 2018-11-02 DIAGNOSIS — Y92009 Unspecified place in unspecified non-institutional (private) residence as the place of occurrence of the external cause: Secondary | ICD-10-CM

## 2018-11-02 DIAGNOSIS — Z87438 Personal history of other diseases of male genital organs: Secondary | ICD-10-CM | POA: Diagnosis not present

## 2018-11-02 DIAGNOSIS — R5381 Other malaise: Secondary | ICD-10-CM | POA: Diagnosis not present

## 2018-11-02 DIAGNOSIS — R52 Pain, unspecified: Secondary | ICD-10-CM | POA: Diagnosis not present

## 2018-11-02 DIAGNOSIS — Z7401 Bed confinement status: Secondary | ICD-10-CM | POA: Diagnosis not present

## 2018-11-02 DIAGNOSIS — Z973 Presence of spectacles and contact lenses: Secondary | ICD-10-CM | POA: Diagnosis not present

## 2018-11-02 DIAGNOSIS — I10 Essential (primary) hypertension: Secondary | ICD-10-CM | POA: Diagnosis not present

## 2018-11-02 DIAGNOSIS — M10079 Idiopathic gout, unspecified ankle and foot: Secondary | ICD-10-CM | POA: Diagnosis not present

## 2018-11-02 DIAGNOSIS — N400 Enlarged prostate without lower urinary tract symptoms: Secondary | ICD-10-CM | POA: Diagnosis present

## 2018-11-02 DIAGNOSIS — R7989 Other specified abnormal findings of blood chemistry: Secondary | ICD-10-CM | POA: Diagnosis not present

## 2018-11-02 DIAGNOSIS — Z23 Encounter for immunization: Secondary | ICD-10-CM | POA: Diagnosis present

## 2018-11-02 DIAGNOSIS — Z87891 Personal history of nicotine dependence: Secondary | ICD-10-CM | POA: Diagnosis not present

## 2018-11-02 DIAGNOSIS — F419 Anxiety disorder, unspecified: Secondary | ICD-10-CM | POA: Diagnosis present

## 2018-11-02 DIAGNOSIS — W052XXA Fall from non-moving motorized mobility scooter, initial encounter: Secondary | ICD-10-CM

## 2018-11-02 DIAGNOSIS — M25471 Effusion, right ankle: Secondary | ICD-10-CM | POA: Diagnosis present

## 2018-11-02 DIAGNOSIS — E119 Type 2 diabetes mellitus without complications: Secondary | ICD-10-CM

## 2018-11-02 DIAGNOSIS — Y999 Unspecified external cause status: Secondary | ICD-10-CM | POA: Insufficient documentation

## 2018-11-02 DIAGNOSIS — Z8349 Family history of other endocrine, nutritional and metabolic diseases: Secondary | ICD-10-CM | POA: Diagnosis not present

## 2018-11-02 DIAGNOSIS — R740 Nonspecific elevation of levels of transaminase and lactic acid dehydrogenase [LDH]: Secondary | ICD-10-CM | POA: Diagnosis not present

## 2018-11-02 DIAGNOSIS — S6991XA Unspecified injury of right wrist, hand and finger(s), initial encounter: Secondary | ICD-10-CM | POA: Diagnosis not present

## 2018-11-02 DIAGNOSIS — Z8249 Family history of ischemic heart disease and other diseases of the circulatory system: Secondary | ICD-10-CM | POA: Diagnosis not present

## 2018-11-02 DIAGNOSIS — Z794 Long term (current) use of insulin: Secondary | ICD-10-CM

## 2018-11-02 DIAGNOSIS — Z7982 Long term (current) use of aspirin: Secondary | ICD-10-CM | POA: Diagnosis not present

## 2018-11-02 DIAGNOSIS — M79661 Pain in right lower leg: Secondary | ICD-10-CM | POA: Diagnosis not present

## 2018-11-02 DIAGNOSIS — E860 Dehydration: Secondary | ICD-10-CM | POA: Diagnosis present

## 2018-11-02 DIAGNOSIS — S99921A Unspecified injury of right foot, initial encounter: Secondary | ICD-10-CM | POA: Diagnosis not present

## 2018-11-02 DIAGNOSIS — R509 Fever, unspecified: Secondary | ICD-10-CM | POA: Insufficient documentation

## 2018-11-02 DIAGNOSIS — R0902 Hypoxemia: Secondary | ICD-10-CM | POA: Diagnosis not present

## 2018-11-02 DIAGNOSIS — M7989 Other specified soft tissue disorders: Secondary | ICD-10-CM | POA: Diagnosis not present

## 2018-11-02 DIAGNOSIS — M10011 Idiopathic gout, right shoulder: Secondary | ICD-10-CM | POA: Diagnosis not present

## 2018-11-02 DIAGNOSIS — E1159 Type 2 diabetes mellitus with other circulatory complications: Secondary | ICD-10-CM | POA: Diagnosis not present

## 2018-11-02 DIAGNOSIS — R2681 Unsteadiness on feet: Secondary | ICD-10-CM | POA: Diagnosis present

## 2018-11-02 DIAGNOSIS — W19XXXA Unspecified fall, initial encounter: Secondary | ICD-10-CM | POA: Diagnosis not present

## 2018-11-02 DIAGNOSIS — M10071 Idiopathic gout, right ankle and foot: Secondary | ICD-10-CM | POA: Diagnosis not present

## 2018-11-02 DIAGNOSIS — M6281 Muscle weakness (generalized): Secondary | ICD-10-CM | POA: Diagnosis not present

## 2018-11-02 DIAGNOSIS — R2241 Localized swelling, mass and lump, right lower limb: Secondary | ICD-10-CM

## 2018-11-02 DIAGNOSIS — E1169 Type 2 diabetes mellitus with other specified complication: Secondary | ICD-10-CM | POA: Diagnosis present

## 2018-11-02 DIAGNOSIS — Z888 Allergy status to other drugs, medicaments and biological substances status: Secondary | ICD-10-CM | POA: Diagnosis not present

## 2018-11-02 DIAGNOSIS — M25472 Effusion, left ankle: Secondary | ICD-10-CM | POA: Diagnosis present

## 2018-11-02 DIAGNOSIS — E1122 Type 2 diabetes mellitus with diabetic chronic kidney disease: Secondary | ICD-10-CM | POA: Diagnosis present

## 2018-11-02 DIAGNOSIS — R202 Paresthesia of skin: Secondary | ICD-10-CM | POA: Diagnosis not present

## 2018-11-02 DIAGNOSIS — Y9389 Activity, other specified: Secondary | ICD-10-CM

## 2018-11-02 DIAGNOSIS — N179 Acute kidney failure, unspecified: Secondary | ICD-10-CM | POA: Diagnosis not present

## 2018-11-02 DIAGNOSIS — S299XXA Unspecified injury of thorax, initial encounter: Secondary | ICD-10-CM | POA: Diagnosis not present

## 2018-11-02 DIAGNOSIS — Z79899 Other long term (current) drug therapy: Secondary | ICD-10-CM | POA: Diagnosis not present

## 2018-11-02 DIAGNOSIS — N183 Chronic kidney disease, stage 3 (moderate): Secondary | ICD-10-CM | POA: Diagnosis not present

## 2018-11-02 DIAGNOSIS — S8991XA Unspecified injury of right lower leg, initial encounter: Secondary | ICD-10-CM | POA: Diagnosis not present

## 2018-11-02 DIAGNOSIS — J9811 Atelectasis: Secondary | ICD-10-CM | POA: Diagnosis not present

## 2018-11-02 DIAGNOSIS — I152 Hypertension secondary to endocrine disorders: Secondary | ICD-10-CM | POA: Diagnosis present

## 2018-11-02 DIAGNOSIS — S99922A Unspecified injury of left foot, initial encounter: Secondary | ICD-10-CM | POA: Diagnosis not present

## 2018-11-02 DIAGNOSIS — R600 Localized edema: Secondary | ICD-10-CM

## 2018-11-02 DIAGNOSIS — R58 Hemorrhage, not elsewhere classified: Secondary | ICD-10-CM | POA: Diagnosis not present

## 2018-11-02 DIAGNOSIS — M2550 Pain in unspecified joint: Secondary | ICD-10-CM | POA: Diagnosis not present

## 2018-11-02 DIAGNOSIS — Z79891 Long term (current) use of opiate analgesic: Secondary | ICD-10-CM | POA: Diagnosis not present

## 2018-11-02 DIAGNOSIS — Z882 Allergy status to sulfonamides status: Secondary | ICD-10-CM | POA: Diagnosis not present

## 2018-11-02 LAB — BASIC METABOLIC PANEL
Anion gap: 13 (ref 5–15)
BUN: 33 mg/dL — AB (ref 8–23)
CALCIUM: 8.7 mg/dL — AB (ref 8.9–10.3)
CO2: 25 mmol/L (ref 22–32)
CREATININE: 1.66 mg/dL — AB (ref 0.61–1.24)
Chloride: 100 mmol/L (ref 98–111)
GFR, EST AFRICAN AMERICAN: 48 mL/min — AB (ref >60)
GFR, EST NON AFRICAN AMERICAN: 41 mL/min — AB (ref >60)
Glucose, Bld: 207 mg/dL — ABNORMAL HIGH (ref 70–99)
Potassium: 3.8 mmol/L (ref 3.5–5.1)
SODIUM: 138 mmol/L (ref 135–145)

## 2018-11-02 LAB — URINALYSIS, ROUTINE W REFLEX MICROSCOPIC
Bacteria, UA: NONE SEEN
Bilirubin Urine: NEGATIVE
Glucose, UA: NEGATIVE mg/dL
Ketones, ur: 5 mg/dL — AB
Leukocytes, UA: NEGATIVE
Nitrite: NEGATIVE
PH: 5 (ref 5.0–8.0)
Protein, ur: 30 mg/dL — AB
SPECIFIC GRAVITY, URINE: 1.016 (ref 1.005–1.030)

## 2018-11-02 LAB — CBC
HCT: 41.3 % (ref 39.0–52.0)
Hemoglobin: 13.8 g/dL (ref 13.0–17.0)
MCH: 30.3 pg (ref 26.0–34.0)
MCHC: 33.4 g/dL (ref 30.0–36.0)
MCV: 90.8 fL (ref 80.0–100.0)
NRBC: 0 % (ref 0.0–0.2)
PLATELETS: 141 10*3/uL — AB (ref 150–400)
RBC: 4.55 MIL/uL (ref 4.22–5.81)
RDW: 15.7 % — AB (ref 11.5–15.5)
WBC: 10 10*3/uL (ref 4.0–10.5)

## 2018-11-02 LAB — I-STAT CG4 LACTIC ACID, ED
LACTIC ACID, VENOUS: 2.16 mmol/L — AB (ref 0.5–1.9)
LACTIC ACID, VENOUS: 1.39 mmol/L (ref 0.5–1.9)
LACTIC ACID, VENOUS: 1.47 mmol/L (ref 0.5–1.9)

## 2018-11-02 MED ORDER — TETANUS-DIPHTH-ACELL PERTUSSIS 5-2.5-18.5 LF-MCG/0.5 IM SUSP
0.5000 mL | Freq: Once | INTRAMUSCULAR | Status: AC
  Administered 2018-11-02: 0.5 mL via INTRAMUSCULAR
  Filled 2018-11-02: qty 0.5

## 2018-11-02 MED ORDER — HYDROCODONE-ACETAMINOPHEN 5-325 MG PO TABS
1.0000 | ORAL_TABLET | Freq: Once | ORAL | Status: AC
  Administered 2018-11-02: 1 via ORAL
  Filled 2018-11-02: qty 1

## 2018-11-02 MED ORDER — VANCOMYCIN HCL 10 G IV SOLR
2000.0000 mg | Freq: Once | INTRAVENOUS | Status: AC
  Administered 2018-11-02: 2000 mg via INTRAVENOUS
  Filled 2018-11-02: qty 2000

## 2018-11-02 MED ORDER — DOXYCYCLINE HYCLATE 100 MG PO CAPS: 100.0000 mg | ORAL_CAPSULE | Freq: Two times a day (BID) | ORAL | 0 refills | Status: DC

## 2018-11-02 MED ORDER — SODIUM CHLORIDE 0.9 % IV BOLUS: 1000.0000 mL | Freq: Once | INTRAVENOUS | Status: DC

## 2018-11-02 MED ORDER — ASPIRIN 81 MG PO CHEW: 81.0000 mg | CHEWABLE_TABLET | Freq: Every day | ORAL | 0 refills | Status: DC

## 2018-11-02 MED ORDER — PREDNISONE 20 MG PO TABS
30.0000 mg | ORAL_TABLET | Freq: Once | ORAL | Status: AC
  Administered 2018-11-02: 30 mg via ORAL
  Filled 2018-11-02: qty 2

## 2018-11-02 MED ORDER — HYDROCODONE-ACETAMINOPHEN 5-325 MG PO TABS: 1.0000 | ORAL_TABLET | Freq: Four times a day (QID) | ORAL | 0 refills | Status: DC | PRN

## 2018-11-02 MED ORDER — SODIUM CHLORIDE 0.9 % IV SOLN
2.0000 g | Freq: Once | INTRAVENOUS | Status: AC
  Administered 2018-11-02: 2 g via INTRAVENOUS
  Filled 2018-11-02: qty 2

## 2018-11-02 MED ORDER — OXYCODONE HCL 5 MG PO TABS
5.0000 mg | ORAL_TABLET | Freq: Once | ORAL | Status: AC
  Administered 2018-11-02: 5 mg via ORAL
  Filled 2018-11-02: qty 1

## 2018-11-02 MED ORDER — SODIUM CHLORIDE 0.9 % IV BOLUS
1500.0000 mL | Freq: Once | INTRAVENOUS | Status: AC
  Administered 2018-11-02: 1500 mL via INTRAVENOUS

## 2018-11-02 MED ORDER — ACETAMINOPHEN 325 MG PO TABS
650.0000 mg | ORAL_TABLET | Freq: Once | ORAL | Status: AC
  Administered 2018-11-02: 650 mg via ORAL
  Filled 2018-11-02: qty 2

## 2018-11-02 MED ORDER — MORPHINE SULFATE (PF) 4 MG/ML IV SOLN
4.0000 mg | Freq: Once | INTRAVENOUS | Status: AC
  Administered 2018-11-02: 4 mg via INTRAVENOUS
  Filled 2018-11-02: qty 1

## 2018-11-02 NOTE — Progress Notes (Signed)
A consult was received from an ED physician for vancomycin and cefepime per pharmacy dosing.  The patient's profile has been reviewed for ht/wt/allergies/indication/available labs.    A one time order has been placed for vancomycin 2 gm and cefepime 2 gm.   Further antibiotics/pharmacy consults should be ordered by admitting physician if indicated.                       Thank you,  Herby Abraham, Pharm.D [phone] 11/02/2018 2:20 PM

## 2018-11-02 NOTE — ED Triage Notes (Signed)
Patient BIB EMS from home. Patient reports pushing an object and the object "got away from him" and he tripped and fell. Patient reporting bilateral foot pain. Patient has history of gout to bilateral feet and diabetes and HTN. The only visible wound from the fall was patient has skin tear to back of right hand- dressed by EMS. C-spine cleared by EMS. Patient denies LOC or head injury. Patient denies blood thinners. EMS VS: 140/77, HR 105, SPo2 96% RA, RR 16, CBG = 180.

## 2018-11-02 NOTE — Consult Note (Addendum)
Medical Consultation   Jerry York  PJA:[id number]  DOB: September 26, 1951  DOA: 11/02/2018  PCP: Ardith Dark, MD   Outpatient Specialists: none   Requesting physician: Dr. Clarene Duke  Reason for consultation: Left foot pain   History of Present Illness: Jerry York is an 67 y.o. male past medical history of diabetes mellitus type 2 last A1c of 6.71-year ago, hypertension hyperlipidemia chronic renal disease, history of gout not on medication (colchicine as he cannot afford it) comes in with bilateral foot pain.  He relates that the foot pain started yesterday he could not even remove the sheets as they hurt with removing the sheets, he cannot bear weight as it is so painful. He relate he is still having pain but not as bad as yesterday. He does not have any cuts or sores in his feet.  He denies any fever, chills, nausea, vomiting.  He also relates that he is had about 8 attacks of pain in the right foot this last year he had been taking colchicine but he cannot afford it.  He relates his leg started swelling over the past 3 days.  In the ED: He was mildly tachycardic and his tachycardia is improving, his lactic acid was barely elevated at 2.1 he did not have a white count, glucose was 200 and x-ray of the right foot showed, no acute fractures or dislocation.    Review of Systems:  ROS As per HPI otherwise 10 point review of systems negative.     Past Medical History: Past Medical History:  Diagnosis Date  . Anxiety   . Bilateral renal cysts   . Foley catheter in place   . Gout    02-22-2017 acute right foot gout---  per pt resolved  . Gross hematuria   . Heart murmur   . Hydronephrosis, left   . Hyperlipidemia   . Hypertension   . Renal insufficiency   . Type 2 diabetes mellitus (HCC)   . Urinary retention   . Wears glasses     Past Surgical History: Past Surgical History:  Procedure Laterality Date  . APPENDECTOMY  age 61  . CYSTOSCOPY WITH  URETEROSCOPY AND STENT PLACEMENT Left 05/08/2017   Procedure: URETEROSCOPY AND STENT PLACEMENT;  Surgeon: Ihor Gully, MD;  Location: [facility];  Service: Urology;  Laterality: Left;  . CYSTOSCOPY/RETROGRADE/URETEROSCOPY Bilateral 05/08/2017   Procedure: CYSTOSCOPY/ BILATERAL RETROGRADE;  Surgeon: Ihor Gully, MD;  Location: [facility];  Service: Urology;  Laterality: Bilateral;  . INGUINAL HERNIA REPAIR Right 10/10/2000  . KNEE ARTHROSCOPY Right 1980's  . SHOULDER ARTHROSCOPY WITH OPEN ROTATOR CUFF REPAIR Right 1990's  . TRANSURETHRAL RESECTION OF PROSTATE       Allergies:   Allergies  Allergen Reactions  . Prednisone Nausea And Vomiting    Elevated blood sugar  . Metformin And Related Diarrhea  . Sulfa Antibiotics Other (See Comments)    "make my feet burn"     Social History:  reports that he has quit smoking. He has a 0.25 pack-year smoking history. He has never used smokeless tobacco. He reports that he does not drink alcohol or use drugs.   Family History: Family History  Problem Relation Age of Onset  . Congestive Heart Failure Mother        Related to valve disease.  Opted to treat medically  . Hypertension Mother   . Hyperlipidemia Mother   .  Hypertension Father   . Hyperlipidemia Father   . Coronary artery disease Father 20       History of CABG  . Hypertension Brother   . Hypertension Daughter   . Hypertension Son     Physical Exam: Vitals:   11/02/18 1300 11/02/18 1330 11/02/18 1430 11/02/18 1500  BP: 133/80 (!) 143/83 135/69 (!) 142/80  Pulse: (!) 106 (!) 106 (!) 112   Resp: 16   (!) 26  Temp:      TempSrc:      SpO2: 94% 93% 95%     Constitutional: Appearance,  Alert and awake, oriented x3, not in any acute distress. Eyes: PERLA, EOMI, irises appear normal, anicteric sclera,  ENMT: external ears and nose appear normal, normal hearing or hard of hearing            Lips appears normal, oropharynx mucosa, tongue,  posterior pharynx appear normal  Neck: neck appears normal, no masses, normal ROM, no thyromegaly, no JVD  CVS: S1-S2 clear, no murmur rubs or gallops, no LE edema, normal pedal pulses  Respiratory:  clear to auscultation bilaterally, no wheezing, rales or rhonchi. Respiratory effort normal. No accessory muscle use.  Abdomen: soft nontender, nondistended, normal bowel sounds, no hepatosplenomegaly, no hernias  Musculoskeletal: no cyanosis, clubbing or edema noted bilaterally Neuro: Cranial nerves II-XII intact, strength, sensation, reflexes Psych: judgement and insight appear normal, stable mood and affect, mental status Skin: His skin he has chronic changes, from venous stasis no erythema not warm to touch exquisitely tender to palpation.  Even blowing on his foot he says it feels like it is painful.  No cuts or sores in his skin   Data reviewed:  I have personally reviewed following labs and imaging studies Labs:  CBC: Recent Labs  Lab 11/02/18 1231  WBC 10.0  HGB 13.8  HCT 41.3  MCV 90.8  PLT 141*    Basic Metabolic Panel: Recent Labs  Lab 11/02/18 1231  NA 138  K 3.8  CL 100  CO2 25  GLUCOSE 207*  BUN 33*  CREATININE 1.66*  CALCIUM 8.7*   GFR CrCl cannot be calculated (Unknown ideal weight.). Liver Function Tests: No results for input(s): AST, ALT, ALKPHOS, BILITOT, PROT, ALBUMIN in the last 168 hours. No results for input(s): LIPASE, AMYLASE in the last 168 hours. No results for input(s): AMMONIA in the last 168 hours. Coagulation profile No results for input(s): INR, PROTIME in the last 168 hours.  Cardiac Enzymes: No results for input(s): CKTOTAL, CKMB, CKMBINDEX, TROPONINI in the last 168 hours. BNP: Invalid input(s): POCBNP CBG: No results for input(s): GLUCAP in the last 168 hours. D-Dimer No results for input(s): DDIMER in the last 72 hours. Hgb A1c No results for input(s): HGBA1C in the last 72 hours. Lipid Profile No results for input(s): CHOL,  HDL, LDLCALC, TRIG, CHOLHDL, LDLDIRECT in the last 72 hours. Thyroid function studies No results for input(s): TSH, T4TOTAL, T3FREE, THYROIDAB in the last 72 hours.  Invalid input(s): FREET3 Anemia work up No results for input(s): VITAMINB12, FOLATE, FERRITIN, TIBC, IRON, RETICCTPCT in the last 72 hours. Urinalysis    Component Value Date/Time   COLORURINE YELLOW 08/12/2017 0124   APPEARANCEUR CLEAR 08/12/2017 0124   LABSPEC 1.010 08/12/2017 0124   PHURINE 5.0 08/12/2017 0124   GLUCOSEU >=500 (A) 08/12/2017 0124   HGBUR SMALL (A) 08/12/2017 0124   BILIRUBINUR NEGATIVE 08/12/2017 0124   BILIRUBINUR negative 12/12/2016 1224   KETONESUR 5 (A) 08/12/2017 0124   PROTEINUR  30 (A) 08/12/2017 0124   UROBILINOGEN 0.2 12/12/2016 1224   NITRITE NEGATIVE 08/12/2017 0124   LEUKOCYTESUR MODERATE (A) 08/12/2017 0124     Microbiology No results found for this or any previous visit (from the past 240 hour(s)).     Inpatient Medications:   Scheduled Meds:  Continuous Infusions: . vancomycin 2,000 mg (11/02/18 1514)     Radiological Exams on Admission: Dg Chest 2 View  Result Date: 11/02/2018 CLINICAL DATA:  Status post fall.  Hypertension and diabetes. EXAM: CHEST - 2 VIEW COMPARISON:  August 08, 2017 FINDINGS: The heart size and mediastinal contours are stable. The heart size is probably enlarged. There is chronic elevation of right hemidiaphragm unchanged. Both lungs are clear. The visualized skeletal structures are unremarkable. IMPRESSION: No active cardiopulmonary disease. Electronically Signed   By: Sherian Rein M.D.   On: 11/02/2018 15:56   Dg Tibia/fibula Right  Result Date: 11/02/2018 CLINICAL DATA:  Status post fall with right lower leg pain. EXAM: RIGHT TIBIA AND FIBULA - 2 VIEW COMPARISON:  None. FINDINGS: There is no evidence of fracture or dislocation. Degenerative joint changes of the right knee and right ankle are identified. Soft tissues are unremarkable. IMPRESSION: No  acute fracture or dislocation. Electronically Signed   By: Sherian Rein M.D.   On: 11/02/2018 15:57   Dg Hand Complete Right  Result Date: 11/02/2018 CLINICAL DATA:  Dorsal hand skin tear after fall. EXAM: RIGHT HAND - COMPLETE 3+ VIEW COMPARISON:  None. FINDINGS: No acute fracture or dislocation. Juxtacortical erosive changes involving the ulnar aspect of the second and third proximal phalanx heads. Asymmetric mild ulnar joint space narrowing of the second and third PIP joints and third MCP joint. Bone mineralization is normal. Mild dorsal hand soft tissue irregularity. 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 IMPRESSION: 1. Mild dorsal soft tissue hand irregularity, corresponding to known skin tear. No acute osseous abnormality. 2. Chronic appearing Juxtacortical erosive changes involving the ulnar aspect of the second and third PIP joints, consistent with gout. Electronically Signed   By: Obie Dredge M.D.   On: 11/02/2018 12:13   Dg Foot Complete Left  Result Date: 11/02/2018 CLINICAL DATA:  Patient fell off of a knee roller. EXAM: LEFT FOOT - COMPLETE 3+ VIEW COMPARISON:  01/01/2018 FINDINGS: There is no evidence of fracture or dislocation. Stable findings of arthropathy at the tarsometatarsal joints. No significant soft tissue swelling. Vascular calcifications noted. Soft tissues are unremarkable. IMPRESSION: No acute fracture or dislocation identified about the left foot. Electronically Signed   By: Ted Mcalpine M.D.   On: 11/02/2018 12:13   Dg Foot Complete Right  Result Date: 11/02/2018 CLINICAL DATA:  Patient fell off of a knee roller this morning. EXAM: RIGHT FOOT COMPLETE - 3+ VIEW COMPARISON:  None. FINDINGS: There is no evidence of fracture or dislocation. Arthritic changes at the subtalar and tarso-metatarsal joints. Midfoot soft tissue swelling. Vascular calcifications noted. IMPRESSION: No acute fracture or  dislocation identified about the right foot. Electronically Signed   By: Ted Mcalpine M.D.   On: 11/02/2018 12:15   Vas [name] Lower Extremity Venous (dvt) (only [facility])  Result Date: 11/02/2018  Lower Venous Study Indications: Pain.  Performing Technologist: Chanda Busing RVT  Examination Guidelines: A complete evaluation includes B-mode imaging, spectral Doppler, color Doppler, and power Doppler as needed of all accessible portions of each vessel. Bilateral testing is considered an integral part of a complete examination.  Limited examinations for reoccurring indications may be performed as noted.  Right Venous Findings: +---------+---------------+---------+-----------+----------+-----------------+          CompressibilityPhasicitySpontaneityPropertiesSummary           +---------+---------------+---------+-----------+----------+-----------------+ CFV      Full           Yes      Yes                                    +---------+---------------+---------+-----------+----------+-----------------+ SFJ      Full                                                           +---------+---------------+---------+-----------+----------+-----------------+ FV Prox  Full                                                           +---------+---------------+---------+-----------+----------+-----------------+ FV Mid   Full                                                           +---------+---------------+---------+-----------+----------+-----------------+ FV DistalFull                                                           +---------+---------------+---------+-----------+----------+-----------------+ PFV      Full                                                           +---------+---------------+---------+-----------+----------+-----------------+ POP      Full           Yes      Yes                                     +---------+---------------+---------+-----------+----------+-----------------+ PTV      Full                                                           +---------+---------------+---------+-----------+----------+-----------------+ PERO     Full                                                           +---------+---------------+---------+-----------+----------+-----------------+  SSV      None                                         Age Indeterminate +---------+---------------+---------+-----------+----------+-----------------+  Left Venous Findings: +---+---------------+---------+-----------+----------+-------+    CompressibilityPhasicitySpontaneityPropertiesSummary +---+---------------+---------+-----------+----------+-------+ CFVFull           Yes      Yes                          +---+---------------+---------+-----------+----------+-------+    Summary: Right: Findings consistent with age indeterminate superficial vein thrombosis involving the right small saphenous vein. There is no evidence of deep vein thrombosis in the lower extremity. A cystic structure measuring 2 cm high by 4 cm wide by greater than 4.7 cm long is found in the popliteal fossa.  *See table(s) above for measurements and observations. Electronically signed by Sherald Hess MD on 11/02/2018 at 1:59:43 PM.    Final     Impression/Recommendations Active Problems:   Type 2 diabetes mellitus (HCC)   Gout flare  This seems to be a gout flare, he has had about 8 episodes this year, 3 or 4 times in this foot, the first 1 was in his right great big toe.  He relates changes joint every time he has gotten, x-ray of his right foot today shows no fractures or dislocation.  He has no leukocytosis or fever, he has no cuts or sores in his skin, his leg is swollen, not erythematous or warm to touch.  It is really tender even to soft touch. He has not been taking his colchicine as he cannot afford it, as he felt  into a donut hole with his insurance. We will go ahead and start him on prednisone 30 mg for 5 days, have explained to him that this will cause his sugar to go up. He takes Novolin R 30 units 3 times a day, while he is on insulin he will take 50 units in the morning 50 units at noon and 40 units at night while he is on prednisone. I have explained to him in detail that he needs to follow-up with his primary care doctor as we need to determine if he is over producer or and under secretory as this way we can put him on the correct medication. Also given Zofran ODT to take at home.  He is going to need transportation to to go home as his wife cannot help him up the stairs at home.    Thank you for this consultation.  Our [facility] hospitalist team will follow the patient with you.   Time Spent: 70 minutes  Marinda Elk M.D. [facility] 11/02/2018, 5:01 PM

## 2018-11-02 NOTE — Care Management Note (Signed)
Case Management Note  Patient Details  Name: [name] MRN: [id number] Date of Birth: 1951/05/20  CM consulted for [facility] PT.  Discussed with [name], [name], pt and spouse only HH PT as CM does not want pt to loose [facility] RN and pharmacy services.  Also discussed DME wheelchair.  Pt and spouse were asking about light weight and electric options.  CM explained the differences with cost and how to obtain each.  Pt and spouse opted to wait until [facility] PT could assist them further and they could think about their options.  Pt has DME rolling walker.  CM also discussed providers that come to their home instead of the pt going to the office; pt fell today going to an appointment with PCP.  Pt and spouse were agreeable to [name], NP as primary choice.  Discussed [facility] transportation home.  CM contacted [facility] and looked up multiple providers that go to the home, none of them are in the [facility] network.  CM discussed this with pt and spouse; everyone was in agreement not to change providers.  CM messaged [facility] RN case manager about pt being in the ED and pt's transportation needs to follow up PCP and neurology appointments.  CM contacted [name] with [name] who is aware of pt's needs and accepted for services.  [facility] contact information placed on AVS.  CM updated [place], [name].  CM updated primary RN that pt will need to be transported home via [facility].  No further CM needs noted at this time.  Expected Discharge Date:   11/02/2018               Expected Discharge Plan:  Home w Home Health Services  Discharge planning Services  CM Consult  Post Acute Care Choice:  Home Health Choice offered to:  Patient, Spouse  HH Arranged:  PT HH Agency:  [facility]  Status of Service:  Completed, signed off  [name], RN 11/02/2018, 11:32 AM

## 2018-11-02 NOTE — Discharge Instructions (Addendum)
Please see the information and instructions below regarding your visit.  Your diagnoses today include:  1. Fever in adult   2. Bilateral foot pain   3. Localized swelling of right lower extremity   4. AKI (acute kidney injury) (HCC)   5. Lactic acidosis   6. Tachycardia    We will place consultation to home health.  They should be contacting within the next several days to evaluate you for home health physical therapy.   Tests performed today include: See side panel of your discharge paperwork for testing performed today. Vital signs are listed at the bottom of these instructions.   Medications prescribed:    Take any prescribed medications only as prescribed, and any over the counter medications only as directed on the packaging.  Doxycycline is an antibiotic that fights infection in the skin. This is prophylaxis. This medication can make your skin sensitive to the sun, so please ensure that you wear sunscreen, hats, or other coverage over your skin while taking this. This medicine CANNOT be taken by women while pregnant, breastfeeding, or trying to become pregnant.  Please speak with a healthcare provider if any of these situations apply to you.  You have been prescribed Norco for pain. This is an opioid pain medication. You may take this medication every 4-6 hours as needed for pain. Only take this medication if you need it for breakthrough pain.   Do not combine this medication with Tylenol, as it may increase the risk of liver problems.   Do not combine this medication with alcohol.  Please be advised to avoid driving or operating heavy machinery while taking this medication, as it may make you drowsy or impair judgment.   Home care instructions:  Please follow any educational materials contained in this packet.   Please use the extra materials we gave you to change your dressing on your hand daily.  This will need to be evaluated within 5 days by your primary care  provider.  The instructions for changing your dressing include washing the wound daily with soap and water, applying the yellow gauze on top, then cutting a piece of the white nonadherent gauze, and then wrapping it with the stretchy gauze all around her hand.  Please elevate your lower extremities daily.  Please apply warm compresses to your right calf.  You have a superficial blood clot in this leg.  You do not need blood thinners for this.  The treatment is 81 mg of aspirin that I prescribed you, and warm compresses.  Follow-up instructions: Please follow-up with your primary care provider in 5 days for  wound recheck.   Return instructions:  Please return to the Emergency Department if you experience worsening symptoms.  Please return for any redness or swelling of your hand, drainage that is green or yellow from your hand. Please return for any persistent fevers, chest pain, shortness of breath, nausea vomiting the prevention from keeping anything down, or abdominal pain. Please return for any increase in swelling of your lower extremity, increasing pain, or increasing redness of your lower extremity's. Please return if you have any other emergent concerns.  Additional Information:   Your vital signs today were: BP (!) 151/86    Pulse (!) 108    Temp 98.7 F (37.1 C) (Axillary)    Resp (!) 26    SpO2 93%  If your blood pressure (BP) was elevated on multiple readings during this visit above 130 for the top number or above 80  for the bottom number, please have this repeated by your primary care provider within one month. --------------  Thank you for allowing [name] to participate in your care today.

## 2018-11-02 NOTE — ED Notes (Signed)
Pt and wife are upset because they dont understand why they are being sent home rather than admitted. Rn as well as md explained but they are still upset

## 2018-11-02 NOTE — ED Notes (Signed)
Blood sugar was 188 using glucose reader on back of right arm

## 2018-11-02 NOTE — Progress Notes (Signed)
Right lower extremity venous duplex has been completed. Negative for DVT. There is evidence of an age indeterminate superficial vein thrombosis involving the lesser saphenous vein of the right lower extremity. A cystic structure measuring 2 cm high by 4 cm wide by greater than 4.7 cm long is found in the popliteal fossa. Results were given to Aviva Kluver PA.  11/02/18 11:40 AM Olen Cordial RVT

## 2018-11-02 NOTE — ED Notes (Signed)
Pt aware that urine sample is needed.  

## 2018-11-02 NOTE — ED Notes (Signed)
Ed providers [name] and [place] notified that patient has a critical lactic acid value of 2.16

## 2018-11-02 NOTE — ED Provider Notes (Signed)
Care assumed from [facility], PA-C at shift change with fluids and pain meds pending.   In brief, this patient is a 67 y.o. M who [place] for evaluation of bilateral foot pain, right greater than left.  He reports that he has these episodes of pain every 2 weeks.  He states that he has a history of gout and he will have a flare and then eventually resolves.  He states that over the last few days, this flare has been particularly worsening has had difficulty walking secondary to pain.  He ran out of his colchicine secondary to recent Medicare changes, prompting ED visit.  Patient with no history of fever, numbness/weakness.  Please see note from previous provider for full history/physical exam.  PLAN: Patient had negative DVT study of right lower extremity.  It did show superficial thrombosis but otherwise controlled.  Additionally, there was concern about some possible cellulitis of the right lower extremity.  Further work-up showed that patient had an elevated lactic acid but no leukocytosis.  He was initially afebrile but then spiked a temp rectal temperature of 100.8 while here in the ED.  Given rectal temperature, tachycardia, broad-spectrum antibiotics were started and hospitalist was consulted for admission.  Hospitalist came and evaluated patient and felt like patient did not meet inpatient criteria.  They felt that his symptoms were more likely due to a gout flare.  They recommended discharging patient home with antibiotics, prednisone for pain control.  Plan to re-eval patient for analgesics and reassess.  MDM:  On my evaluation, right lower extremity slightly more swollen than left but is not erythematous or warm.  The swelling is located at the ankle which is where he typically gets his gout flares.  Good DP pulses and cap refill distally bilaterally to feet.  Reevaluation.  Patient reports he still in pain.  His tachycardia fluctuates anywhere between 100-116.  Patient is also hypertensive.   Other vitals are otherwise stable.  He reports he is still having pain.  Will give additional analgesics.  I discussed at length with both wife and patient regarding hospitalist recommendation for outpatient treatment.  I discussed that we would send him home with pain control to help with the gout.  At this time, given his slightly elevation in urine creatinine, he is not a candidate for colchicine.  Wife is concerned about patient going home as he has been having difficulty walking secondary to pain.  At this time, given patient's concerns for safety, feel that Jerry York is the best option to transport patient home.  Will give additional analgesics and reassess.  Patient still reporting pain after additional analgesics.  Initially, he is still tachycardic.  I suspect this more likely from pain rather than infectious process as he is does not have a fever on repeat vitals and he has no leukocytosis.  I discussed outpatient treatment with patient and wife. Both Dr. Clarene Duke and I had extensive conversation with both patient and wife regarding hospitalist recommendations.  At this time, given patient's extensive history of uncontrolled diabetes, feel that prednisone is not the best option given risk of hyperglycemia.  We discussed risk first benefits with both patient and wife, including but not limited to increased hyperglycemia.  Patient expresses understanding and wishes to decline prednisone.  Will send home a short course of narcotic pain medication to help with pain.  Additionally, we discussed with patient and wife regarding following up with Dr. and potential rheumatology follow-up for further evaluation and management of his  gout.  After extensive conversation, patient and wife are comfortable with patient going home. Patient had ample opportunity for questions and discussion. All patient's questions were answered with full understanding. Strict return precautions discussed. Patient expresses understanding and  agreement to plan.   1. Fever in adult   2. Bilateral foot pain   3. Localized swelling of right lower extremity   4. AKI (acute kidney injury) (HCC)   5. Tachycardia   6. Elevated lactic acid level         Jerry Caul, PA-C 11/03/18 0017    Little, Ambrose Finland, MD 11/04/18 [phone]

## 2018-11-02 NOTE — ED Notes (Signed)
[facility] contacted to take pt back to home

## 2018-11-02 NOTE — ED Notes (Signed)
Bed: TD97 Expected date:  Expected time:  Means of arrival:  Comments: 67 yo fall, foot pain

## 2018-11-02 NOTE — ED Notes (Signed)
[facility] at bedside 

## 2018-11-02 NOTE — ED Notes (Signed)
Pt reports that morphine is not working

## 2018-11-02 NOTE — ED Provider Notes (Addendum)
[place] DEPT Provider Note   CSN: [id number] Arrival date & time: 11/02/18  1829     History   Chief Complaint Chief Complaint  Patient presents with  . Foot Pain  . Fall    HPI Jerry York is a 67 y.o. male.  HPI  Patient is a 67 year old male with a history of type 2 diabetes, hypertension, hyperlipidemia, renal insufficiency, BPH, surgically corrected, presenting for bilateral foot pain.  He also reports that he fell as a result of the foot pain today.  He reports he was using a scooter and tipped to the side due to the pain in both of his feet.  He denies hitting his head.  He reports that he caught himself with his right hand, and sustained skin tear to the right hand.  He reports that neither foot is worse than another.  He reports that this is been going on for approximately a year, and every 2 weeks he will have increased foot pain.  He also notes that he has had some slight swelling to his right lower extremity over the past 4 days.  Denies fever or chills.  Denies erythema.  Denies any pain or swelling of the MTP joint.  Patient also denies any chest pain, shortness of breath, coughing, back pain, lower extremity weakness or numbness, saddle anesthesia, or loss of bowel or bladder control.  He denies any dysuria, urgency, or frequency.  Patient denies remedies at home for symptoms.  He reports that the foot pain is gotten so bad that he is no longer able to ambulate without assistance.  Past Medical History:  Diagnosis Date  . Anxiety   . Bilateral renal cysts   . Foley catheter in place   . Gout    02-22-2017 acute right foot gout---  per pt resolved  . Gross hematuria   . Heart murmur   . Hydronephrosis, left   . Hyperlipidemia   . Hypertension   . Renal insufficiency   . Type 2 diabetes mellitus ([place])   . Urinary retention   . Wears glasses     Patient Active Problem List   Diagnosis Date Noted  . Right knee pain  01/10/2018  . Lower extremity edema 12/11/2017  . Hypertriglyceridemia 11/27/2017  . Family history of premature CAD 11/27/2017  . 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 History of BPH 08/14/2017  . Type 2 diabetes mellitus ([place]) 08/09/2017  . Gout 08/09/2017  . Hydronephrosis, left 08/09/2017  . Anxiety 08/09/2017  . Chest pain with low risk for cardiac etiology 08/09/2017  . Hyperlipidemia associated with type 2 diabetes mellitus ([place]) 08/08/2017  . Hypertension associated with diabetes ([place]) 08/08/2017    Past Surgical History:  Procedure Laterality Date  . APPENDECTOMY  age 65  . CYSTOSCOPY WITH URETEROSCOPY AND STENT PLACEMENT Left 05/08/2017   Procedure: URETEROSCOPY AND STENT PLACEMENT;  Surgeon: Kathie Rhodes, MD;  Location: [facility];  Service: Urology;  Laterality: Left;  . CYSTOSCOPY/RETROGRADE/URETEROSCOPY Bilateral 05/08/2017   Procedure: CYSTOSCOPY/ BILATERAL RETROGRADE;  Surgeon: Kathie Rhodes, MD;  Location: [facility];  Service: Urology;  Laterality: Bilateral;  . INGUINAL HERNIA REPAIR Right 10/10/2000  . KNEE ARTHROSCOPY Right 1980's  . SHOULDER ARTHROSCOPY WITH OPEN ROTATOR CUFF REPAIR Right 1990's  . TRANSURETHRAL RESECTION OF PROSTATE          Home Medications    Prior to Admission medications   Medication Sig Start Date End Date  Taking? Authorizing Provider  acetaminophen (TYLENOL) 325 MG tablet Take 650 mg by mouth every 6 (six) hours as needed for moderate pain.   Yes [provider]  amLODipine (NORVASC) 5 MG tablet TAKE 1 TABLET BY MOUTH ONCE DAILY 08/22/18  Yes [name], MD  atorvastatin (LIPITOR) 20 MG tablet TAKE ONE TABLET BY MOUTH EVERY EVENING 08/23/18  Yes [name], MD  [name] Take 5,000 mg by mouth 2 (two) times daily.    Yes [provider]  clonazePAM (KLONOPIN) 0.5 MG tablet Take 1 tablet (0.5 mg total) by mouth 2 (two) times daily. 06/11/18   Yes [name], MD  furosemide (LASIX) 80 MG tablet Take 1 tablet (80 mg total) by mouth 2 (two) times daily. 06/11/18  Yes [name], MD  insulin regular (NOVOLIN R,HUMULIN R) 100 units/mL injection Inject 30 Units into the skin 3 (three) times daily before meals.   Yes [provider]  metoprolol tartrate (LOPRESSOR) 100 MG tablet Take 1 tablet (100 mg total) by mouth 2 (two) times daily. 08/23/18  Yes [name], MD  niacin 500 MG tablet Take 500 mg by mouth 2 (two) times daily after a meal.   Yes [provider]  Continuous Blood Gluc Receiver ([name] 14 DAY READER) DEVI USE AS DIRECTED THREE TIMES DAILY 07/12/18   [name], MD  Continuous Blood Gluc Sensor ([name] 14 DAY SENSOR) MISC 1 Units by Does not apply route every 14 (fourteen) days. 06/12/18   [name], MD  glucose blood ([name] TEST STRIPS) test strip Check blood sugars 2-3 times per day. DX: E11.9 09/01/17   [name], MD  glucose monitoring kit ([name]) monitoring kit 1 each by Does not apply route as needed for other. DX Code: E11.9 06/11/18   [name], MD  omega-3 acid ethyl esters (LOVAZA) 1 g capsule Take 1 capsule (1 g total) by mouth 2 (two) times daily. Patient not taking: Reported on 11/02/2018 08/29/17   [name], MD    Family History Family History  Problem Relation Age of Onset  . Congestive Heart Failure Mother        Related to valve disease.  Opted to treat medically  . Hypertension Mother   . Hyperlipidemia Mother   . Hypertension Father   . Hyperlipidemia Father   . Coronary artery disease Father 61       History of CABG  . Hypertension Brother   . Hypertension Daughter   . Hypertension Son     Social History Social History   Tobacco Use  . Smoking status: Never Smoker  . Smokeless tobacco: Never Used  Substance Use Topics  . Alcohol use: No  . Drug use: No     Allergies   Prednisone; Metformin and related; and  Sulfa antibiotics   Review of Systems Review of Systems  Constitutional: Negative for chills and fever.  HENT: Negative for congestion, rhinorrhea, sinus pain and sore throat.   Eyes: Negative for visual disturbance.  Respiratory: Negative for cough, chest tightness and shortness of breath.   Cardiovascular: Positive for leg swelling. Negative for chest pain and palpitations.  Gastrointestinal: Negative for abdominal pain, constipation, diarrhea, nausea and vomiting.  Genitourinary: Negative for dysuria and flank pain.  Musculoskeletal: Positive for arthralgias and myalgias. Negative for back pain, neck pain and neck stiffness.  Skin: Negative for rash.  Neurological: Negative for dizziness, syncope, light-headedness and headaches.  Physical Exam Updated Vital Signs BP 133/80   Pulse (!) 106   Temp 98.7 F (37.1 C) (Oral)   Resp 16   SpO2 94%   Physical Exam  Constitutional: He appears well-developed and well-nourished. No distress.  HENT:  Head: Normocephalic and atraumatic.  Mouth/Throat: Oropharynx is clear and moist.  Eyes: Pupils are equal, round, and reactive to light. Conjunctivae and EOM are normal.  Neck: Normal range of motion. Neck supple.  No nuchal rigidity.  No meningismus.  Cardiovascular: Normal rate, regular rhythm, S1 normal and S2 normal.  No murmur heard. 1+, nonpitting edema of the right lower extremity. Pulses 2+ and equal bilateral DP and PT pulses.  Pulmonary/Chest: Effort normal and breath sounds normal. He has no wheezes. He has no rales.  Abdominal: Soft. He exhibits no distension. There is no tenderness. There is no guarding.  Musculoskeletal: Normal range of motion. He exhibits edema. He exhibits no deformity.  2+ DP and PT pulses of bilateral feet.  Patient has tenderness to palpation of all toes of bilateral feet, as well as MTP joints of bilateral feet.  Patient has mild erythema over the MTP joint of the right great toe.  Right hand and  wrist without TTP of distal radius or anatomic snuffbox. Full range of motion of all MCP joints and phalanges of right hand. No soft tissue swelling over distal radius or joints of right hand. No midline TTP of cervical, thoracic, or lumbar spine. Patient laterally rotates cervical spine 45 degrees without difficulty.  Lymphadenopathy:    He has no cervical adenopathy.  Neurological: He is alert.  Cranial nerves grossly intact. Patient moves extremities symmetrically and with good coordination.  Skin: Skin is warm and dry. No rash noted. There is erythema.     Mild rubor to the right lower extremity.  Right hand exhibits 2 skin tears.  There is no tendon involvement.  There is a sliver of muscle exposure on the dorsum of the right hand.  Skin of chest, back, abdomen without lesions. Patient has an insulin sensor on his RUE.  Psychiatric: He has a normal mood and affect. His behavior is normal. Judgment and thought content normal.  Nursing note and vitals reviewed.    ED Treatments / Results  Labs (all labs ordered are listed, but only abnormal results are displayed) Labs Reviewed  BASIC METABOLIC PANEL - Abnormal; Notable for the following components:      Result Value   Glucose, Bld 207 (*)    BUN 33 (*)    Creatinine, Ser 1.66 (*)    Calcium 8.7 (*)    GFR calc non Af Amer 41 (*)    GFR calc Af Amer 48 (*)    All other components within normal limits  CBC - Abnormal; Notable for the following components:   RDW 15.7 (*)    Platelets 141 (*)    All other components within normal limits  CULTURE, BLOOD (ROUTINE X 2)  CULTURE, BLOOD (ROUTINE X 2)  URINE CULTURE  URINALYSIS, ROUTINE W REFLEX MICROSCOPIC  I-STAT CG4 LACTIC ACID, ED    EKG None  Radiology Dg Hand Complete Right  Result Date: 11/02/2018 CLINICAL DATA:  Dorsal hand skin tear after fall. EXAM: RIGHT HAND - COMPLETE 3+ VIEW COMPARISON:  None. FINDINGS: No acute fracture or dislocation. Juxtacortical erosive  changes involving the ulnar aspect of the second and third proximal phalanx heads. Asymmetric mild ulnar joint space narrowing of the second and third PIP joints and  third MCP joint. Bone mineralization is normal. Mild dorsal hand soft tissue irregularity. 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 IMPRESSION: 1. Mild dorsal soft tissue hand irregularity, corresponding to known skin tear. No acute osseous abnormality. 2. Chronic appearing Juxtacortical erosive changes involving the ulnar aspect of the second and third PIP joints, consistent with gout. Electronically Signed   By: Titus Dubin M.D.   On: 11/02/2018 12:13   Dg Foot Complete Left  Result Date: 11/02/2018 CLINICAL DATA:  Patient fell off of a knee roller. EXAM: LEFT FOOT - COMPLETE 3+ VIEW COMPARISON:  01/01/2018 FINDINGS: There is no evidence of fracture or dislocation. Stable findings of arthropathy at the tarsometatarsal joints. No significant soft tissue swelling. Vascular calcifications noted. Soft tissues are unremarkable. IMPRESSION: No acute fracture or dislocation identified about the left foot. Electronically Signed   By: Fidela Salisbury M.D.   On: 11/02/2018 12:13   Dg Foot Complete Right  Result Date: 11/02/2018 CLINICAL DATA:  Patient fell off of a knee roller this morning. EXAM: RIGHT FOOT COMPLETE - 3+ VIEW COMPARISON:  None. FINDINGS: There is no evidence of fracture or dislocation. Arthritic changes at the subtalar and tarso-metatarsal joints. Midfoot soft tissue swelling. Vascular calcifications noted. IMPRESSION: No acute fracture or dislocation identified about the right foot. Electronically Signed   By: Fidela Salisbury M.D.   On: 11/02/2018 12:15   Vas [name] Lower Extremity Venous (dvt) (only [facility])  Result Date: 11/02/2018  Lower Venous Study Indications: Pain.  Performing Technologist: Oliver Hum RVT  Examination Guidelines: A complete evaluation  includes B-mode imaging, spectral Doppler, color Doppler, and power Doppler as needed of all accessible portions of each vessel. Bilateral testing is considered an integral part of a complete examination. Limited examinations for reoccurring indications may be performed as noted.  Right Venous Findings: +---------+---------------+---------+-----------+----------+-----------------+          CompressibilityPhasicitySpontaneityPropertiesSummary           +---------+---------------+---------+-----------+----------+-----------------+ CFV      Full           Yes      Yes                                    +---------+---------------+---------+-----------+----------+-----------------+ SFJ      Full                                                           +---------+---------------+---------+-----------+----------+-----------------+ FV Prox  Full                                                           +---------+---------------+---------+-----------+----------+-----------------+ FV Mid   Full                                                           +---------+---------------+---------+-----------+----------+-----------------+ FV  DistalFull                                                           +---------+---------------+---------+-----------+----------+-----------------+ PFV      Full                                                           +---------+---------------+---------+-----------+----------+-----------------+ POP      Full           Yes      Yes                                    +---------+---------------+---------+-----------+----------+-----------------+ PTV      Full                                                           +---------+---------------+---------+-----------+----------+-----------------+ PERO     Full                                                            +---------+---------------+---------+-----------+----------+-----------------+ SSV      None                                         Age Indeterminate +---------+---------------+---------+-----------+----------+-----------------+  Left Venous Findings: +---+---------------+---------+-----------+----------+-------+    CompressibilityPhasicitySpontaneityPropertiesSummary +---+---------------+---------+-----------+----------+-------+ CFVFull           Yes      Yes                          +---+---------------+---------+-----------+----------+-------+    Summary: Right: Findings consistent with age indeterminate superficial vein thrombosis involving the right small saphenous vein. There is no evidence of deep vein thrombosis in the lower extremity. A cystic structure measuring 2 cm high by 4 cm wide by greater than 4.7 cm long is found in the popliteal fossa.  *See table(s) above for measurements and observations. Electronically signed by Monica Martinez MD on 11/02/2018 at 1:59:43 PM.    Final     Procedures Procedures (including critical care time)  Medications Ordered in ED Medications  ceFEPIme (MAXIPIME) 2 g in sodium chloride 0.9 % 100 mL IVPB (2 g Intravenous New Bag/Given 11/02/18 1430)  vancomycin (VANCOCIN) 2,000 mg in sodium chloride 0.9 % 500 mL IVPB (has no administration in time range)  sodium chloride 0.9 % bolus 1,000 mL (has no administration in time range)  acetaminophen (TYLENOL) tablet 650 mg (has no administration in time range)  Tdap (BOOSTRIX) injection 0.5 mL (0.5 mLs Intramuscular Given 11/02/18 1217)  HYDROcodone-acetaminophen (NORCO/VICODIN) 5-325 MG per tablet 1  tablet (1 tablet Oral Given 11/02/18 1216)     Initial Impression / Assessment and Plan / ED Course  I have reviewed the triage vital signs and the nursing notes.  Pertinent labs & imaging results that were available during my care of the patient were reviewed by me and considered in my medical  decision making (see chart for details).  Clinical Course as of Nov 02 1858  Fri Nov 02, 2018  1309 Creatinine(!): 1.66 [AM]  1309 Slightly worse than prior baseline.    [AM]  1412 Rectal temperature performed by me and is 100.8. This was after 325 mg Tylenol.   [AM]  1610 Noted. Fluid bolus 1500 ml ordered.   Lactic Acid, Venous(!!): 2.16 [AM]  9604 Spoke with Dr. Venetia Constable who states patient does not meet admission criteria. Recommends prednisone, pain control, and increase in insulin. Will relay recommendation to oncoming team.    [AM]    Clinical Course User Index [AM] Albesa Seen, PA-C    Patient nontoxic-appearing and in no acute distress on initial examination.  Patient presenting for mechanical fall after his knee scooter slid from underneath him.  No head or neck trauma.  No distracting injury warranting head or cervical imaging.  No blood thinners.  Skin and joints of trunk, and all extremities were evaluated, radiographs ordered as appropriate, and no concerns for traumatic fracture at this time.  Skin tears of the dorsum of the right hand loosely approximated due to dehiscence of skin flap.  Wound cleansed with sterile saline. Tdap updated. Patient to be placed on prophylactic antibiotics for this. Patient be worked up further for foot pain.   On reassessment during ED course, patient has fever of unknown origin, however on reassessment, concerned that patient may have early cellulitis of the right lower extremity with asymmetric swelling, warmth, and rubor due to venous insufficiency possibly masking erythema.  DVT study negative with exception of superficial venous thrombosis. No other focal infectious symptoms.  Do not suspect pneumonia given no pulmonary symptoms, do not suspect meningitis given no headache or nuchal rigidity, do not suspect epidural abscess given no back pain or neurologic symptoms.  No urinary symptoms but urine is pending.  Code sepsis called. Patient meeting  SIRS criteria based on tachycardia, fever. See vitals below. Suspected source of infection skin based on exam and no other focal symptoms. Lactic acid 2.16. Signs of end organ dysfunction include AKI.  Vitals:   11/02/18 1230 11/02/18 1231 11/02/18 1242 11/02/18 1300  BP: 139/77   133/80  Pulse:  (!) 103  (!) 106  Resp:    16  Temp:   98.7 F (37.1 C)   TempSrc:   Oral   SpO2:  95%  94%     Broad spectrum antibiotics initiated based on suspected source of infection. Alternative fluid bolus administered due to lactic <4 and no hypotension.   After evaluation by hospitalist, Dr. Venetia Constable, fever was felt to be more related to gout per Dr. Venetia Constable.  He recommends prednisone, 30 milligrams for 5 days, as well as altering patient's insulin regimen as stated in his chart. Also recommends Zofran.   I will also place patient on doxycycline twice a day for 7 days due to his hand laceration.  Patient needs recheck of his hand within 5 days by primary care.  He needs daily dressing changes, and I performed dressing in front of wife.  Patient also be placed on ASA 81 mg daily for 7 to 10  days for his superficial venous thrombosis. No evidence of DVT.  Patient was seen by case manager, Jerry Justin, RN.  Home health orders were placed for home health physical therapy.  Patient does receive nursing and pharmacy [facility] already.  Patient was continuing to receive medications, and will continue reassessment by next provider. Need lactic acid reassessment. Reassessment pending by Providence Lanius, PA-C. Handout given by myself and Dr. Francine Graven.  Patient was independently evaluated by Dr. Lurene Shadow.  Final Clinical Impressions(s) / ED Diagnoses   Final diagnoses:  Fever in adult  Bilateral foot pain  Localized swelling of right lower extremity  AKI (acute kidney injury) [facility])    ED Discharge Orders    None         Albesa Seen, PA-C 11/03/18 [place], [place], DO 11/03/18 [phone]

## 2018-11-03 ENCOUNTER — Encounter (HOSPITAL_COMMUNITY): Payer: Self-pay | Admitting: *Deleted

## 2018-11-03 ENCOUNTER — Inpatient Hospital Stay (HOSPITAL_COMMUNITY)
Admission: EM | Admit: 2018-11-03 | Discharge: 2018-11-10 | DRG: 554 | Disposition: A | Discharge status: Skilled Nursing Facility | Payer: PPO | Attending: Internal Medicine | Admitting: Internal Medicine

## 2018-11-03 ENCOUNTER — Other Ambulatory Visit: Payer: Self-pay

## 2018-11-03 DIAGNOSIS — R262 Difficulty in walking, not elsewhere classified: Secondary | ICD-10-CM | POA: Diagnosis present

## 2018-11-03 DIAGNOSIS — Z87891 Personal history of nicotine dependence: Secondary | ICD-10-CM

## 2018-11-03 DIAGNOSIS — Z882 Allergy status to sulfonamides status: Secondary | ICD-10-CM

## 2018-11-03 DIAGNOSIS — E119 Type 2 diabetes mellitus without complications: Secondary | ICD-10-CM

## 2018-11-03 DIAGNOSIS — R011 Cardiac murmur, unspecified: Secondary | ICD-10-CM | POA: Diagnosis present

## 2018-11-03 DIAGNOSIS — Z79891 Long term (current) use of opiate analgesic: Secondary | ICD-10-CM

## 2018-11-03 DIAGNOSIS — Z8249 Family history of ischemic heart disease and other diseases of the circulatory system: Secondary | ICD-10-CM

## 2018-11-03 DIAGNOSIS — E1159 Type 2 diabetes mellitus with other circulatory complications: Secondary | ICD-10-CM

## 2018-11-03 DIAGNOSIS — Z888 Allergy status to other drugs, medicaments and biological substances status: Secondary | ICD-10-CM

## 2018-11-03 DIAGNOSIS — E860 Dehydration: Secondary | ICD-10-CM | POA: Diagnosis present

## 2018-11-03 DIAGNOSIS — N183 Chronic kidney disease, stage 3 (moderate): Secondary | ICD-10-CM

## 2018-11-03 DIAGNOSIS — M79671 Pain in right foot: Secondary | ICD-10-CM

## 2018-11-03 DIAGNOSIS — F419 Anxiety disorder, unspecified: Secondary | ICD-10-CM | POA: Diagnosis present

## 2018-11-03 DIAGNOSIS — M109 Gout, unspecified: Secondary | ICD-10-CM | POA: Diagnosis present

## 2018-11-03 DIAGNOSIS — I10 Essential (primary) hypertension: Secondary | ICD-10-CM

## 2018-11-03 DIAGNOSIS — Z8349 Family history of other endocrine, nutritional and metabolic diseases: Secondary | ICD-10-CM

## 2018-11-03 DIAGNOSIS — E1122 Type 2 diabetes mellitus with diabetic chronic kidney disease: Secondary | ICD-10-CM

## 2018-11-03 DIAGNOSIS — M79672 Pain in left foot: Secondary | ICD-10-CM

## 2018-11-03 DIAGNOSIS — N400 Enlarged prostate without lower urinary tract symptoms: Secondary | ICD-10-CM | POA: Diagnosis present

## 2018-11-03 DIAGNOSIS — Z794 Long term (current) use of insulin: Secondary | ICD-10-CM

## 2018-11-03 DIAGNOSIS — Z7982 Long term (current) use of aspirin: Secondary | ICD-10-CM

## 2018-11-03 DIAGNOSIS — Z87438 Personal history of other diseases of male genital organs: Secondary | ICD-10-CM | POA: Diagnosis present

## 2018-11-03 DIAGNOSIS — M25471 Effusion, right ankle: Secondary | ICD-10-CM | POA: Diagnosis present

## 2018-11-03 DIAGNOSIS — M10071 Idiopathic gout, right ankle and foot: Secondary | ICD-10-CM

## 2018-11-03 DIAGNOSIS — R7989 Other specified abnormal findings of blood chemistry: Secondary | ICD-10-CM

## 2018-11-03 DIAGNOSIS — E1169 Type 2 diabetes mellitus with other specified complication: Secondary | ICD-10-CM | POA: Diagnosis present

## 2018-11-03 DIAGNOSIS — M10079 Idiopathic gout, unspecified ankle and foot: Secondary | ICD-10-CM

## 2018-11-03 DIAGNOSIS — E785 Hyperlipidemia, unspecified: Secondary | ICD-10-CM | POA: Diagnosis present

## 2018-11-03 DIAGNOSIS — Z79899 Other long term (current) drug therapy: Secondary | ICD-10-CM

## 2018-11-03 DIAGNOSIS — M25472 Effusion, left ankle: Secondary | ICD-10-CM | POA: Diagnosis present

## 2018-11-03 DIAGNOSIS — Z23 Encounter for immunization: Secondary | ICD-10-CM

## 2018-11-03 DIAGNOSIS — Z973 Presence of spectacles and contact lenses: Secondary | ICD-10-CM

## 2018-11-03 DIAGNOSIS — N182 Chronic kidney disease, stage 2 (mild): Secondary | ICD-10-CM | POA: Diagnosis present

## 2018-11-03 DIAGNOSIS — R0682 Tachypnea, not elsewhere classified: Secondary | ICD-10-CM

## 2018-11-03 DIAGNOSIS — R2681 Unsteadiness on feet: Secondary | ICD-10-CM

## 2018-11-03 DIAGNOSIS — I152 Hypertension secondary to endocrine disorders: Secondary | ICD-10-CM | POA: Diagnosis present

## 2018-11-03 LAB — BASIC METABOLIC PANEL
Anion gap: 11 (ref 5–15)
BUN: 27 mg/dL — AB (ref 8–23)
CO2: 24 mmol/L (ref 22–32)
CREATININE: 1.61 mg/dL — AB (ref 0.61–1.24)
Calcium: 8.7 mg/dL — ABNORMAL LOW (ref 8.9–10.3)
Chloride: 101 mmol/L (ref 98–111)
GFR calc Af Amer: 50 mL/min — ABNORMAL LOW (ref >60)
GFR, EST NON AFRICAN AMERICAN: 43 mL/min — AB (ref >60)
GLUCOSE: 174 mg/dL — AB (ref 70–99)
POTASSIUM: 3.9 mmol/L (ref 3.5–5.1)
SODIUM: 136 mmol/L (ref 135–145)

## 2018-11-03 LAB — CBC WITH DIFFERENTIAL/PLATELET
ABS IMMATURE GRANULOCYTES: 0.09 10*3/uL — AB (ref 0.00–0.07)
BASOS PCT: 0 %
Basophils Absolute: 0 10*3/uL (ref 0.0–0.1)
EOS PCT: 0 %
Eosinophils Absolute: 0 10*3/uL (ref 0.0–0.5)
HCT: 39 % (ref 39.0–52.0)
HEMOGLOBIN: 12.3 g/dL — AB (ref 13.0–17.0)
IMMATURE GRANULOCYTES: 1 %
Lymphocytes Relative: 10 %
Lymphs Abs: 1 10*3/uL (ref 0.7–4.0)
MCH: 29.2 pg (ref 26.0–34.0)
MCHC: 31.5 g/dL (ref 30.0–36.0)
MCV: 92.6 fL (ref 80.0–100.0)
MONO ABS: 0.8 10*3/uL (ref 0.1–1.0)
Monocytes Relative: 8 %
NEUTROS ABS: 7.7 10*3/uL (ref 1.7–7.7)
Neutrophils Relative %: 81 %
Platelets: 173 10*3/uL (ref 150–400)
RBC: 4.21 MIL/uL — ABNORMAL LOW (ref 4.22–5.81)
RDW: 15.6 % — ABNORMAL HIGH (ref 11.5–15.5)
WBC: 9.5 10*3/uL (ref 4.0–10.5)
nRBC: 0 % (ref 0.0–0.2)

## 2018-11-03 LAB — CREATININE, SERUM
Creatinine, Ser: 1.61 mg/dL — ABNORMAL HIGH (ref 0.61–1.24)
GFR, EST AFRICAN AMERICAN: 50 mL/min — AB (ref >60)
GFR, EST NON AFRICAN AMERICAN: 43 mL/min — AB (ref >60)

## 2018-11-03 LAB — CBC
HEMATOCRIT: 37 % — AB (ref 39.0–52.0)
HEMOGLOBIN: 11.8 g/dL — AB (ref 13.0–17.0)
MCH: 29.1 pg (ref 26.0–34.0)
MCHC: 31.9 g/dL (ref 30.0–36.0)
MCV: 91.4 fL (ref 80.0–100.0)
NRBC: 0 % (ref 0.0–0.2)
Platelets: 161 10*3/uL (ref 150–400)
RBC: 4.05 MIL/uL — AB (ref 4.22–5.81)
RDW: 15.8 % — AB (ref 11.5–15.5)
WBC: 9.3 10*3/uL (ref 4.0–10.5)

## 2018-11-03 LAB — GLUCOSE, CAPILLARY
GLUCOSE-CAPILLARY: 163 mg/dL — AB (ref 70–99)
GLUCOSE-CAPILLARY: 180 mg/dL — AB (ref 70–99)

## 2018-11-03 LAB — I-STAT CG4 LACTIC ACID, ED
LACTIC ACID, VENOUS: 2.41 mmol/L — AB (ref 0.5–1.9)
Lactic Acid, Venous: 1.16 mmol/L (ref 0.5–1.9)

## 2018-11-03 MED ORDER — OXYCODONE HCL 5 MG PO TABS
5.0000 mg | ORAL_TABLET | ORAL | Status: DC | PRN
  Administered 2018-11-03 – 2018-11-10 (×21): 5 mg via ORAL
  Filled 2018-11-03 (×22): qty 1

## 2018-11-03 MED ORDER — INSULIN ASPART 100 UNIT/ML ~~LOC~~ SOLN
0.0000 [IU] | Freq: Every day | SUBCUTANEOUS | Status: DC
  Administered 2018-11-04: 3 [IU] via SUBCUTANEOUS
  Administered 2018-11-05: 5 [IU] via SUBCUTANEOUS
  Administered 2018-11-06: 4 [IU] via SUBCUTANEOUS
  Administered 2018-11-07: 5 [IU] via SUBCUTANEOUS
  Administered 2018-11-08: 4 [IU] via SUBCUTANEOUS
  Administered 2018-11-09: 3 [IU] via SUBCUTANEOUS

## 2018-11-03 MED ORDER — INSULIN ASPART 100 UNIT/ML ~~LOC~~ SOLN
0.0000 [IU] | Freq: Three times a day (TID) | SUBCUTANEOUS | Status: DC
  Administered 2018-11-04 (×2): 3 [IU] via SUBCUTANEOUS
  Administered 2018-11-04: 5 [IU] via SUBCUTANEOUS
  Administered 2018-11-05 (×2): 8 [IU] via SUBCUTANEOUS
  Administered 2018-11-05: 5 [IU] via SUBCUTANEOUS
  Administered 2018-11-06: 8 [IU] via SUBCUTANEOUS
  Administered 2018-11-06 – 2018-11-07 (×5): 11 [IU] via SUBCUTANEOUS
  Administered 2018-11-08: 8 [IU] via SUBCUTANEOUS
  Administered 2018-11-08 (×2): 11 [IU] via SUBCUTANEOUS
  Administered 2018-11-09: 8 [IU] via SUBCUTANEOUS
  Administered 2018-11-09: 10 [IU] via SUBCUTANEOUS
  Administered 2018-11-09: 3 [IU] via SUBCUTANEOUS
  Administered 2018-11-10 (×2): 8 [IU] via SUBCUTANEOUS

## 2018-11-03 MED ORDER — INSULIN ASPART 100 UNIT/ML ~~LOC~~ SOLN: 30.0000 [IU] | Freq: Three times a day (TID) | SUBCUTANEOUS | Status: DC

## 2018-11-03 MED ORDER — SODIUM CHLORIDE 0.9 % IV BOLUS
1000.0000 mL | Freq: Once | INTRAVENOUS | Status: AC
  Administered 2018-11-03: 1000 mL via INTRAVENOUS

## 2018-11-03 MED ORDER — CLONAZEPAM 0.5 MG PO TABS
0.5000 mg | ORAL_TABLET | Freq: Two times a day (BID) | ORAL | Status: DC
  Administered 2018-11-03 – 2018-11-10 (×14): 0.5 mg via ORAL
  Filled 2018-11-03 (×14): qty 1

## 2018-11-03 MED ORDER — ACETAMINOPHEN 325 MG PO TABS
650.0000 mg | ORAL_TABLET | Freq: Four times a day (QID) | ORAL | Status: DC | PRN
  Administered 2018-11-04 – 2018-11-08 (×5): 650 mg via ORAL
  Filled 2018-11-03 (×5): qty 2

## 2018-11-03 MED ORDER — MORPHINE SULFATE (PF) 2 MG/ML IV SOLN
2.0000 mg | INTRAVENOUS | Status: DC | PRN
  Administered 2018-11-04 – 2018-11-10 (×13): 2 mg via INTRAVENOUS
  Filled 2018-11-03 (×13): qty 1

## 2018-11-03 MED ORDER — PREDNISONE 50 MG PO TABS
50.0000 mg | ORAL_TABLET | Freq: Every day | ORAL | Status: DC
  Administered 2018-11-05: 50 mg via ORAL
  Filled 2018-11-03 (×2): qty 1

## 2018-11-03 MED ORDER — ATORVASTATIN CALCIUM 20 MG PO TABS
20.0000 mg | ORAL_TABLET | Freq: Every day | ORAL | Status: DC
  Administered 2018-11-03 – 2018-11-09 (×7): 20 mg via ORAL
  Filled 2018-11-03: qty 2
  Filled 2018-11-03 (×6): qty 1

## 2018-11-03 MED ORDER — HYDROMORPHONE HCL 1 MG/ML IJ SOLN
1.0000 mg | Freq: Once | INTRAMUSCULAR | Status: AC
  Administered 2018-11-03: 1 mg via INTRAVENOUS
  Filled 2018-11-03: qty 1

## 2018-11-03 MED ORDER — ENOXAPARIN SODIUM 40 MG/0.4ML ~~LOC~~ SOLN
40.0000 mg | SUBCUTANEOUS | Status: DC
  Administered 2018-11-03 – 2018-11-08 (×6): 40 mg via SUBCUTANEOUS
  Filled 2018-11-03 (×6): qty 0.4

## 2018-11-03 MED ORDER — DOXYCYCLINE HYCLATE 100 MG PO TABS
100.0000 mg | ORAL_TABLET | Freq: Two times a day (BID) | ORAL | Status: DC
  Administered 2018-11-03: 100 mg via ORAL
  Filled 2018-11-03 (×2): qty 1

## 2018-11-03 MED ORDER — METOPROLOL TARTRATE 100 MG PO TABS
100.0000 mg | ORAL_TABLET | Freq: Two times a day (BID) | ORAL | Status: DC
  Administered 2018-11-03 – 2018-11-10 (×12): 100 mg via ORAL
  Filled 2018-11-03 (×14): qty 1

## 2018-11-03 MED ORDER — AMLODIPINE BESYLATE 5 MG PO TABS
5.0000 mg | ORAL_TABLET | Freq: Every day | ORAL | Status: DC
  Administered 2018-11-03 – 2018-11-10 (×8): 5 mg via ORAL
  Filled 2018-11-03 (×8): qty 1

## 2018-11-03 MED ORDER — COLCHICINE 0.6 MG PO TABS
0.6000 mg | ORAL_TABLET | Freq: Every day | ORAL | Status: DC
  Administered 2018-11-03 – 2018-11-10 (×8): 0.6 mg via ORAL
  Filled 2018-11-03 (×8): qty 1

## 2018-11-03 MED ORDER — ASPIRIN 81 MG PO CHEW
81.0000 mg | CHEWABLE_TABLET | Freq: Every day | ORAL | Status: DC
  Administered 2018-11-04 – 2018-11-10 (×7): 81 mg via ORAL
  Filled 2018-11-03 (×8): qty 1

## 2018-11-03 NOTE — ED Triage Notes (Signed)
Pt reports he has same bil. Foot pain he had at [facility] yesterday. Pt reports the pain meds did not help his foot pain. Pt here to day for pain control .

## 2018-11-03 NOTE — ED Provider Notes (Signed)
[facility] EMERGENCY DEPARTMENT Provider Note   CSN: [id number] Arrival date & time: 11/03/18  1324     History   Chief Complaint Chief Complaint  Patient presents with  . Foot Pain    HPI Jerry York is a 67 y.o. male.  He is complaining of severe bilateral foot pain and lower leg pain that is been going on for a few days.  He was seen at his primary care doctors yesterday but fell and then ended up coming to [facility] where he was noted to be febrile.  He was then treated empirically for sepsis.  Ultimately he was evaluated by the hospitalist and they did not feel he needed admission and recommended treatment for gout.  He was discharged on hydrocodone but states he was unable to manage the pain.  He states he cannot get up and walk cannot feed himself from the inability to even get around in his house.  He rates the pain is severe and occurs at rest although it is increased with any type of weightbearing.  He is not sure if he had a fever today.  He denies any chest pain or shortness of breath or abdominal pain.  No cough no urinary symptoms.  The history is provided by the patient.  Foot Pain  This is a recurrent problem. The current episode started more than 2 days ago. The problem occurs constantly. The problem has been gradually worsening. Pertinent negatives include no chest pain, no abdominal pain, no headaches and no shortness of breath. The symptoms are aggravated by standing. Nothing relieves the symptoms. He has tried rest for the symptoms. The treatment provided no relief.    Past Medical History:  Diagnosis Date  . Anxiety   . Bilateral renal cysts   . Foley catheter in place   . Gout    02-22-2017 acute right foot gout---  per pt resolved  . Gross hematuria   . Heart murmur   . Hydronephrosis, left   . Hyperlipidemia   . Hypertension   . Renal insufficiency   . Type 2 diabetes mellitus ([place])   . Urinary retention   . Wears glasses      Patient Active Problem List   Diagnosis Date Noted  . Gout flare 11/02/2018  . Right knee pain 01/10/2018  . Lower extremity edema 12/11/2017  . Hypertriglyceridemia 11/27/2017  . Family history of premature CAD 11/27/2017  . 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 History of BPH 08/14/2017  . Type 2 diabetes mellitus ([place]) 08/09/2017  . Gout 08/09/2017  . Hydronephrosis, left 08/09/2017  . Anxiety 08/09/2017  . Chest pain with low risk for cardiac etiology 08/09/2017  . Hyperlipidemia associated with type 2 diabetes mellitus ([place]) 08/08/2017  . Hypertension associated with diabetes ([place]) 08/08/2017    Past Surgical History:  Procedure Laterality Date  . APPENDECTOMY  age 61  . CYSTOSCOPY WITH URETEROSCOPY AND STENT PLACEMENT Left 05/08/2017   Procedure: URETEROSCOPY AND STENT PLACEMENT;  Surgeon: Kathie Rhodes, MD;  Location: [facility];  Service: Urology;  Laterality: Left;  . CYSTOSCOPY/RETROGRADE/URETEROSCOPY Bilateral 05/08/2017   Procedure: CYSTOSCOPY/ BILATERAL RETROGRADE;  Surgeon: Kathie Rhodes, MD;  Location: [facility];  Service: Urology;  Laterality: Bilateral;  . INGUINAL HERNIA REPAIR Right 10/10/2000  . KNEE ARTHROSCOPY Right 1980's  . SHOULDER ARTHROSCOPY WITH OPEN ROTATOR CUFF REPAIR Right 1990's  . TRANSURETHRAL RESECTION OF PROSTATE  Home Medications    Prior to Admission medications   Medication Sig Start Date End Date Taking? Authorizing Provider  acetaminophen (TYLENOL) 325 MG tablet Take 650 mg by mouth every 6 (six) hours as needed for moderate pain.    [provider]  amLODipine (NORVASC) 5 MG tablet TAKE 1 TABLET BY MOUTH ONCE DAILY 08/22/18   [name], MD  aspirin 81 MG chewable tablet Chew 1 tablet (81 mg total) by mouth daily for 7 days. 11/02/18 11/09/18  Langston Masker B, PA-C  atorvastatin (LIPITOR) 20 MG tablet TAKE ONE TABLET BY MOUTH EVERY EVENING  08/23/18   [name], MD  [name] Take 5,000 mg by mouth 2 (two) times daily.     [provider]  clonazePAM (KLONOPIN) 0.5 MG tablet Take 1 tablet (0.5 mg total) by mouth 2 (two) times daily. 06/11/18   [name], MD  Continuous Blood Gluc Receiver ([name] 14 DAY READER) DEVI USE AS DIRECTED THREE TIMES DAILY 07/12/18   [name], MD  Continuous Blood Gluc Sensor ([name] 14 DAY SENSOR) MISC 1 Units by Does not apply route every 14 (fourteen) days. 06/12/18   [name], MD  doxycycline (VIBRAMYCIN) 100 MG capsule Take 1 capsule (100 mg total) by mouth 2 (two) times daily for 7 days. 11/02/18 11/09/18  Langston Masker B, PA-C  furosemide (LASIX) 80 MG tablet Take 1 tablet (80 mg total) by mouth 2 (two) times daily. 06/11/18   [name], MD  glucose blood ([name] TEST STRIPS) test strip Check blood sugars 2-3 times per day. DX: E11.9 09/01/17   [name], MD  glucose monitoring kit ([name]) monitoring kit 1 each by Does not apply route as needed for other. DX Code: E11.9 06/11/18   [name], MD  HYDROcodone-acetaminophen (NORCO/VICODIN) 5-325 MG tablet Take 1 tablet by mouth every 6 (six) hours as needed. 11/02/18   Langston Masker B, PA-C  insulin regular (NOVOLIN R,HUMULIN R) 100 units/mL injection Inject 30 Units into the skin 3 (three) times daily before meals.    [provider]  metoprolol tartrate (LOPRESSOR) 100 MG tablet Take 1 tablet (100 mg total) by mouth 2 (two) times daily. 08/23/18   [name], MD  niacin 500 MG tablet Take 500 mg by mouth 2 (two) times daily after a meal.    [provider]  omega-3 acid ethyl esters (LOVAZA) 1 g capsule Take 1 capsule (1 g total) by mouth 2 (two) times daily. Patient not taking: Reported on 11/02/2018 08/29/17   [name], MD    Family History Family History  Problem Relation Age of Onset  . Congestive Heart Failure Mother        Related to valve  disease.  Opted to treat medically  . Hypertension Mother   . Hyperlipidemia Mother   . Hypertension Father   . Hyperlipidemia Father   . Coronary artery disease Father 56       History of CABG  . Hypertension Brother   . Hypertension Daughter   . Hypertension Son     Social History Social History   Tobacco Use  . Smoking status: Former Smoker    Packs/day: 0.25    Years: 1.00    Pack years: 0.25  . Smokeless tobacco: Never Used  Substance Use Topics  . Alcohol use: No  . Drug use: No     Allergies   Prednisone; Metformin and related; and  Sulfa antibiotics   Review of Systems Review of Systems  Constitutional: Positive for fever.  HENT: Negative for sore throat.   Eyes: Negative for visual disturbance.  Respiratory: Negative for shortness of breath.   Cardiovascular: Negative for chest pain.  Gastrointestinal: Negative for abdominal pain.  Genitourinary: Negative for dysuria.  Musculoskeletal: Positive for gait problem. Negative for neck pain.  Skin: Negative for rash.  Neurological: Negative for headaches.     Physical Exam Updated Vital Signs BP 129/76 (BP Location: Left Arm)   Pulse (!) 110   Temp 98.6 F (37 C) (Oral)   Resp 20   Ht 5' 6"  (1.676 m)   Wt 83.9 kg   SpO2 96%   BMI 29.86 kg/m   Physical Exam  Constitutional: He appears well-developed and well-nourished.  HENT:  Head: Normocephalic and atraumatic.  Eyes: Conjunctivae are normal.  Neck: Neck supple.  Cardiovascular: Regular rhythm. Tachycardia present.  No murmur heard. Pulmonary/Chest: Effort normal and breath sounds normal. No respiratory distress.  Abdominal: Soft. There is no tenderness.  Musculoskeletal: He exhibits no edema.  Bilateral feet are diffusely tender.  He is got intact DP pulses.  Calves are less tender but still tender to palpation.  There is some mild swelling in his feet and lower legs.  No obvious erythema or any wounds.  Neurological: He is alert.  Skin: Skin  is warm and dry.  Psychiatric: He has a normal mood and affect.  Nursing note and vitals reviewed.    ED Treatments / Results  Labs (all labs ordered are listed, but only abnormal results are displayed) Labs Reviewed  BASIC METABOLIC PANEL - Abnormal; Notable for the following components:      Result Value   Glucose, Bld 174 (*)    BUN 27 (*)    Creatinine, Ser 1.61 (*)    Calcium 8.7 (*)    GFR calc non Af Amer 43 (*)    GFR calc Af Amer 50 (*)    All other components within normal limits  CBC WITH DIFFERENTIAL/PLATELET - Abnormal; Notable for the following components:   RBC 4.21 (*)    Hemoglobin 12.3 (*)    RDW 15.6 (*)    Abs Immature Granulocytes 0.09 (*)    All other components within normal limits  I-STAT CG4 LACTIC ACID, ED - Abnormal; Notable for the following components:   Lactic Acid, Venous 2.41 (*)    All other components within normal limits  I-STAT CG4 LACTIC ACID, ED    EKG EKG Interpretation  Date/Time:  Saturday November 03 2018 13:29:43 EST Ventricular Rate:  109 PR Interval:    QRS Duration: 137 QT Interval:  365 QTC Calculation: 492 R Axis:   -66 Text Interpretation:  Sinus tachycardia Consider right atrial enlargement RBBB and LAFB Minimal ST elevation, lateral leads Baseline wander in lead(s) V1 similar to prior yesterday 11/19 Confirmed by Aletta Edouard [phone]) on 11/03/2018 1:40:55 PM   Radiology Dg Chest 2 View  Result Date: 11/02/2018 CLINICAL DATA:  Status post fall.  Hypertension and diabetes. EXAM: CHEST - 2 VIEW COMPARISON:  August 08, 2017 FINDINGS: The heart size and mediastinal contours are stable. The heart size is probably enlarged. There is chronic elevation of right hemidiaphragm unchanged. Both lungs are clear. The visualized skeletal structures are unremarkable. IMPRESSION: No active cardiopulmonary disease. Electronically Signed   By: Abelardo Diesel M.D.   On: 11/02/2018 15:56   Dg Tibia/fibula Right  Result Date:  11/02/2018 CLINICAL DATA:  Status post fall with right lower leg pain. EXAM: RIGHT TIBIA AND FIBULA - 2 VIEW COMPARISON:  None. FINDINGS: There is no evidence of fracture or dislocation. Degenerative joint changes of the right knee and right ankle are identified. Soft tissues are unremarkable. IMPRESSION: No acute fracture or dislocation. Electronically Signed   By: Abelardo Diesel M.D.   On: 11/02/2018 15:57   Dg Hand Complete Right  Result Date: 11/02/2018 CLINICAL DATA:  Dorsal hand skin tear after fall. EXAM: RIGHT HAND - COMPLETE 3+ VIEW COMPARISON:  None. FINDINGS: No acute fracture or dislocation. Juxtacortical erosive changes involving the ulnar aspect of the second and third proximal phalanx heads. Asymmetric mild ulnar joint space narrowing of the second and third PIP joints and third MCP joint. Bone mineralization is normal. Mild dorsal hand soft tissue irregularity. 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 IMPRESSION: 1. Mild dorsal soft tissue hand irregularity, corresponding to known skin tear. No acute osseous abnormality. 2. Chronic appearing Juxtacortical erosive changes involving the ulnar aspect of the second and third PIP joints, consistent with gout. Electronically Signed   By: Titus Dubin M.D.   On: 11/02/2018 12:13   Dg Foot Complete Left  Result Date: 11/02/2018 CLINICAL DATA:  Patient fell off of a knee roller. EXAM: LEFT FOOT - COMPLETE 3+ VIEW COMPARISON:  01/01/2018 FINDINGS: There is no evidence of fracture or dislocation. Stable findings of arthropathy at the tarsometatarsal joints. No significant soft tissue swelling. Vascular calcifications noted. Soft tissues are unremarkable. IMPRESSION: No acute fracture or dislocation identified about the left foot. Electronically Signed   By: Fidela Salisbury M.D.   On: 11/02/2018 12:13   Dg Foot Complete Right  Result Date: 11/02/2018 CLINICAL DATA:  Patient fell off of  a knee roller this morning. EXAM: RIGHT FOOT COMPLETE - 3+ VIEW COMPARISON:  None. FINDINGS: There is no evidence of fracture or dislocation. Arthritic changes at the subtalar and tarso-metatarsal joints. Midfoot soft tissue swelling. Vascular calcifications noted. IMPRESSION: No acute fracture or dislocation identified about the right foot. Electronically Signed   By: Fidela Salisbury M.D.   On: 11/02/2018 12:15   Vas [name] Lower Extremity Venous (dvt) (only [facility])  Result Date: 11/02/2018  Lower Venous Study Indications: Pain.  Performing Technologist: Oliver Hum RVT  Examination Guidelines: A complete evaluation includes B-mode imaging, spectral Doppler, color Doppler, and power Doppler as needed of all accessible portions of each vessel. Bilateral testing is considered an integral part of a complete examination. Limited examinations for reoccurring indications may be performed as noted.  Right Venous Findings: +---------+---------------+---------+-----------+----------+-----------------+          CompressibilityPhasicitySpontaneityPropertiesSummary           +---------+---------------+---------+-----------+----------+-----------------+ CFV      Full           Yes      Yes                                    +---------+---------------+---------+-----------+----------+-----------------+ SFJ      Full                                                           +---------+---------------+---------+-----------+----------+-----------------+  FV Prox  Full                                                           +---------+---------------+---------+-----------+----------+-----------------+ FV Mid   Full                                                           +---------+---------------+---------+-----------+----------+-----------------+ FV DistalFull                                                            +---------+---------------+---------+-----------+----------+-----------------+ PFV      Full                                                           +---------+---------------+---------+-----------+----------+-----------------+ POP      Full           Yes      Yes                                    +---------+---------------+---------+-----------+----------+-----------------+ PTV      Full                                                           +---------+---------------+---------+-----------+----------+-----------------+ PERO     Full                                                           +---------+---------------+---------+-----------+----------+-----------------+ SSV      None                                         Age Indeterminate +---------+---------------+---------+-----------+----------+-----------------+  Left Venous Findings: +---+---------------+---------+-----------+----------+-------+    CompressibilityPhasicitySpontaneityPropertiesSummary +---+---------------+---------+-----------+----------+-------+ CFVFull           Yes      Yes                          +---+---------------+---------+-----------+----------+-------+    Summary: Right: Findings consistent with age indeterminate superficial vein thrombosis involving the right small saphenous vein. There is no evidence of deep vein thrombosis in the lower extremity. A cystic structure measuring 2 cm high by 4 cm wide by greater than 4.7 cm long is found  in the popliteal fossa.  *See table(s) above for measurements and observations. Electronically signed by Monica Martinez MD on 11/02/2018 at 1:59:43 PM.    Final     Procedures Procedures (including critical care time)  Medications Ordered in ED Medications  HYDROmorphone (DILAUDID) injection 1 mg (has no administration in time range)  sodium chloride 0.9 % bolus 1,000 mL (has no administration in time range)     Initial Impression /  Assessment and Plan / ED Course  I have reviewed the triage vital signs and the nursing notes.  Pertinent labs & imaging results that were available during my care of the patient were reviewed by me and considered in my medical decision making (see chart for details).  Clinical Course as of Nov 03 1536  Sat Nov 03, 2018  1454 Patient's lab work concerning for an elevated lactic acid again of 2.41.  His white count is normal.  His electrolytes are close to baseline although he still has a little bump in his creatinine.  He had imaging done yesterday so I do not think that would be worth repeating.  I do not feel it would be safe to return this gentleman home by ambulance as he has been unable to ambulate or take care of himself at home.  I reviewed the case with tried hospitalist Dr. Daryll Drown who will evaluate the patient in the ED for possible observation admission.   [MB]    Clinical Course User Index [MB] Hayden Rasmussen, MD      Final Clinical Impressions(s) / ED Diagnoses   Final diagnoses:  Foot pain, left  Foot pain, right  Elevated lactic acid level  Unsteady gait    ED Discharge Orders    None       Hayden Rasmussen, MD 11/03/18 1537

## 2018-11-03 NOTE — ED Triage Notes (Signed)
PT offered  Food . Pt requested  Ice water but refused happy meal at this time.

## 2018-11-03 NOTE — H&P (Signed)
History and Physical    MONTERRIO GERST KYH:[id number] DOB: 16-Jul-1951 DOA: 11/03/2018  PCP: [name], MD Patient coming from: Home  Chief Complaint: right ankle pain  HPI: Jerry York is a 67 y.o. male with medical history significant of DM2, CKD, HTN, HLD, anxiety and gout who presents with acute gout flare.  He was seen in the ED yesterday for the same.  He reports that the pain started in his ankle of his right foot and has progressed to his complete foot and with shooting pain up his legs.  He has had chills and erythema and swelling on the right side.  No open wounds or skin breakdown.  He presented to the [facility] ED yesterday and was advised to do a trial at home.  He was sent home with hydrocodone and doxycycline.  He has not been able to afford colchicine recently.  He did not tolerate being at home as he could not walk to the bathroom and was not able to get himself food.  There is no one at home able to help him move to the bathroom.  He is very concerned about going home.  His history of gout is long, however, he has never had a crystal diagnosis.  This is similar to previous episodes which he has been told is gout.  He has an elevated uric acid, last checked in 2018.  Nothing really seems to make the episodes improve however, he just waits it out.  The pain is now moving in to his left foot and he is unable to walk.   He has been having these episodes every 2-3 weeks.  Nothing much helps and they resolve on their own.  He is chronically on lasix.   ED Course: In the ED, he was given fluids and pain medication with limited improvement in his pain.  His wife is very concerned with his going home.   Review of Systems: As per HPI otherwise 10 point review of systems negative.    Past Medical History:  Diagnosis Date  . Anxiety   . Bilateral renal cysts   . Foley catheter in place   . Gout    02-22-2017 acute right foot gout---  per pt resolved  . Gross hematuria   .  Heart murmur   . Hydronephrosis, left   . Hyperlipidemia   . Hypertension   . Renal insufficiency   . Type 2 diabetes mellitus ([place])   . Urinary retention   . Wears glasses     Past Surgical History:  Procedure Laterality Date  . APPENDECTOMY  age 67  . CYSTOSCOPY WITH URETEROSCOPY AND STENT PLACEMENT Left 05/08/2017   Procedure: URETEROSCOPY AND STENT PLACEMENT;  Surgeon: Kathie Rhodes, MD;  Location: [facility];  Service: Urology;  Laterality: Left;  . CYSTOSCOPY/RETROGRADE/URETEROSCOPY Bilateral 05/08/2017   Procedure: CYSTOSCOPY/ BILATERAL RETROGRADE;  Surgeon: Kathie Rhodes, MD;  Location: [facility];  Service: Urology;  Laterality: Bilateral;  . INGUINAL HERNIA REPAIR Right 10/10/2000  . KNEE ARTHROSCOPY Right 1980's  . SHOULDER ARTHROSCOPY WITH OPEN ROTATOR CUFF REPAIR Right 1990's  . TRANSURETHRAL RESECTION OF PROSTATE     Reviewed with patient.   reports that he has quit smoking. He has a 0.25 pack-year smoking history. He has never used smokeless tobacco. He reports that he does not drink alcohol or use drugs.  Allergies  Allergen Reactions  . Prednisone Nausea And Vomiting    Elevated blood sugar  . Metformin  And Related Diarrhea  . Sulfa Antibiotics Other (See Comments)    "make my feet burn"   Reviewed with patient.  Family History  Problem Relation Age of Onset  . Congestive Heart Failure Mother        Related to valve disease.  Opted to treat medically  . Hypertension Mother   . Hyperlipidemia Mother   . Hypertension Father   . Hyperlipidemia Father   . Coronary artery disease Father 90       History of CABG  . Hypertension Brother   . Hypertension Daughter   . Hypertension Son     Prior to Admission medications   Medication Sig Start Date End Date Taking? Authorizing Provider  acetaminophen (TYLENOL) 325 MG tablet Take 650 mg by mouth every 6 (six) hours as needed for moderate pain.    [provider]    amLODipine (NORVASC) 5 MG tablet TAKE 1 TABLET BY MOUTH ONCE DAILY 08/22/18   [name], MD  aspirin 81 MG chewable tablet Chew 1 tablet (81 mg total) by mouth daily for 7 days. 11/02/18 11/09/18  Langston Masker B, PA-C  atorvastatin (LIPITOR) 20 MG tablet TAKE ONE TABLET BY MOUTH EVERY EVENING 08/23/18   [name], MD  [name] Take 5,000 mg by mouth 2 (two) times daily.     [provider]  clonazePAM (KLONOPIN) 0.5 MG tablet Take 1 tablet (0.5 mg total) by mouth 2 (two) times daily. 06/11/18   [name], MD  Continuous Blood Gluc Receiver ([name] 14 DAY READER) DEVI USE AS DIRECTED THREE TIMES DAILY 07/12/18   [name], MD  Continuous Blood Gluc Sensor ([name] 14 DAY SENSOR) MISC 1 Units by Does not apply route every 14 (fourteen) days. 06/12/18   [name], MD  doxycycline (VIBRAMYCIN) 100 MG capsule Take 1 capsule (100 mg total) by mouth 2 (two) times daily for 7 days. 11/02/18 11/09/18  Langston Masker B, PA-C  furosemide (LASIX) 80 MG tablet Take 1 tablet (80 mg total) by mouth 2 (two) times daily. 06/11/18   [name], MD  glucose blood ([name] TEST STRIPS) test strip Check blood sugars 2-3 times per day. DX: E11.9 09/01/17   [name], MD  glucose monitoring kit ([name]) monitoring kit 1 each by Does not apply route as needed for other. DX Code: E11.9 06/11/18   [name], MD  HYDROcodone-acetaminophen (NORCO/VICODIN) 5-325 MG tablet Take 1 tablet by mouth every 6 (six) hours as needed. 11/02/18   Langston Masker B, PA-C  insulin regular (NOVOLIN R,HUMULIN R) 100 units/mL injection Inject 30 Units into the skin 3 (three) times daily before meals.    [provider]  metoprolol tartrate (LOPRESSOR) 100 MG tablet Take 1 tablet (100 mg total) by mouth 2 (two) times daily. 08/23/18   [name], MD  niacin 500 MG tablet Take 500 mg by mouth 2 (two) times daily after a meal.    [provider]  omega-3  acid ethyl esters (LOVAZA) 1 g capsule Take 1 capsule (1 g total) by mouth 2 (two) times daily. Patient not taking: Reported on 11/02/2018 08/29/17   [name], MD    Physical Exam:  Constitutional: Lying in bed, appears uncomfortable Vitals:   11/03/18 1326 11/03/18 1331 11/03/18 1333  BP:  129/76   Pulse:  (!) 110   Resp:  20   Temp:  98.6 F (37 C)   TempSrc:  Oral   SpO2: 94% 96%   Weight:   83.9 kg  Height:   _0  (1.676 m)   Eyes:  lids and conjunctivae normal ENMT: Mucous membranes are moist Neck: normal, supple Respiratory: Breathing comfortably, no audible wheezes Cardiovascular: Tachycardic, regular rhythm, no murmur Abdomen: +BS, NT, ND Musculoskeletal: Swollen right ankle and foot, ruddy inflammation to entire foot, minimal swelling of left ankle.  TTP over both ankles and right foot Skin: No rash, ruddy discoloration of legs, chest and face.  He reports that this is not normal for him.  + warmth, but equal in both legs.  Neurologic: He is able to move his LE without issue, but limited ankle movement due to pain.  Psychiatric: Normal judgment and insight. Alert and oriented x 3. Normal mood.    Labs on Admission: I have personally reviewed following labs and imaging studies  CBC: Recent Labs  Lab 11/02/18 1231 11/03/18 1407  WBC 10.0 9.5  NEUTROABS  --  7.7  HGB 13.8 12.3*  HCT 41.3 39.0  MCV 90.8 92.6  PLT 141* 175   Basic Metabolic Panel: Recent Labs  Lab 11/02/18 1231 11/03/18 1407  NA 138 136  K 3.8 3.9  CL 100 101  CO2 25 24  GLUCOSE 207* 174*  BUN 33* 27*  CREATININE 1.66* 1.61*  CALCIUM 8.7* 8.7*   GFR: Estimated Creatinine Clearance: 45.8 mL/min (A) (by C-G formula based on SCr of 1.61 mg/dL (H)). Liver Function Tests: No results for input(s): AST, ALT, ALKPHOS, BILITOT, PROT, ALBUMIN in the last 168 hours. No results for input(s): LIPASE, AMYLASE in the last 168 hours. No results for input(s): AMMONIA in the last 168  hours. Coagulation Profile: No results for input(s): INR, PROTIME in the last 168 hours. Cardiac Enzymes: No results for input(s): CKTOTAL, CKMB, CKMBINDEX, TROPONINI in the last 168 hours. BNP (last 3 results) No results for input(s): PROBNP in the last 8760 hours. HbA1C: No results for input(s): HGBA1C in the last 72 hours. CBG: No results for input(s): GLUCAP in the last 168 hours. Lipid Profile: No results for input(s): CHOL, HDL, LDLCALC, TRIG, CHOLHDL, LDLDIRECT in the last 72 hours. Thyroid Function Tests: No results for input(s): TSH, T4TOTAL, FREET4, T3FREE, THYROIDAB in the last 72 hours. Anemia Panel: No results for input(s): VITAMINB12, FOLATE, FERRITIN, TIBC, IRON, RETICCTPCT in the last 72 hours. Urine analysis:    Component Value Date/Time   COLORURINE YELLOW 11/02/2018 2001   APPEARANCEUR CLEAR 11/02/2018 2001   LABSPEC 1.016 11/02/2018 2001   PHURINE 5.0 11/02/2018 2001   GLUCOSEU NEGATIVE 11/02/2018 2001   HGBUR MODERATE (A) 11/02/2018 2001   BILIRUBINUR NEGATIVE 11/02/2018 2001   BILIRUBINUR negative 12/12/2016 1224   KETONESUR 5 (A) 11/02/2018 2001   PROTEINUR 30 (A) 11/02/2018 2001   UROBILINOGEN 0.2 12/12/2016 1224   NITRITE NEGATIVE 11/02/2018 2001   LEUKOCYTESUR NEGATIVE 11/02/2018 2001    Radiological Exams on Admission: Dg Chest 2 View  Result Date: 11/02/2018 CLINICAL DATA:  Status post fall.  Hypertension and diabetes. EXAM: CHEST - 2 VIEW COMPARISON:  August 08, 2017 FINDINGS: The heart size and mediastinal contours are stable. The heart size is probably enlarged. There is chronic elevation of right hemidiaphragm unchanged. Both lungs are clear. The visualized skeletal structures are unremarkable. IMPRESSION: No active cardiopulmonary disease. Electronically Signed   By: Abelardo Diesel M.D.   On: 11/02/2018 15:56   Dg Tibia/fibula Right  Result Date: 11/02/2018 CLINICAL DATA:  Status post fall with right lower  leg pain. EXAM: RIGHT TIBIA AND  FIBULA - 2 VIEW COMPARISON:  None. FINDINGS: There is no evidence of fracture or dislocation. Degenerative joint changes of the right knee and right ankle are identified. Soft tissues are unremarkable. IMPRESSION: No acute fracture or dislocation. Electronically Signed   By: Abelardo Diesel M.D.   On: 11/02/2018 15:57   Dg Hand Complete Right  Result Date: 11/02/2018 CLINICAL DATA:  Dorsal hand skin tear after fall. EXAM: RIGHT HAND - COMPLETE 3+ VIEW COMPARISON:  None. FINDINGS: No acute fracture or dislocation. Juxtacortical erosive changes involving the ulnar aspect of the second and third proximal phalanx heads. Asymmetric mild ulnar joint space narrowing of the second and third PIP joints and third MCP joint. Bone mineralization is normal. Mild dorsal hand soft tissue irregularity. 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 IMPRESSION: 1. Mild dorsal soft tissue hand irregularity, corresponding to known skin tear. No acute osseous abnormality. 2. Chronic appearing Juxtacortical erosive changes involving the ulnar aspect of the second and third PIP joints, consistent with gout. Electronically Signed   By: Titus Dubin M.D.   On: 11/02/2018 12:13   Dg Foot Complete Left  Result Date: 11/02/2018 CLINICAL DATA:  Patient fell off of a knee roller. EXAM: LEFT FOOT - COMPLETE 3+ VIEW COMPARISON:  01/01/2018 FINDINGS: There is no evidence of fracture or dislocation. Stable findings of arthropathy at the tarsometatarsal joints. No significant soft tissue swelling. Vascular calcifications noted. Soft tissues are unremarkable. IMPRESSION: No acute fracture or dislocation identified about the left foot. Electronically Signed   By: Fidela Salisbury M.D.   On: 11/02/2018 12:13   Dg Foot Complete Right  Result Date: 11/02/2018 CLINICAL DATA:  Patient fell off of a knee roller this morning. EXAM: RIGHT FOOT COMPLETE - 3+ VIEW COMPARISON:  None. FINDINGS:  There is no evidence of fracture or dislocation. Arthritic changes at the subtalar and tarso-metatarsal joints. Midfoot soft tissue swelling. Vascular calcifications noted. IMPRESSION: No acute fracture or dislocation identified about the right foot. Electronically Signed   By: Fidela Salisbury M.D.   On: 11/02/2018 12:15   Vas [name] Lower Extremity Venous (dvt) (only [facility])  Result Date: 11/02/2018  Lower Venous Study Indications: Pain.  Performing Technologist: Oliver Hum RVT  Examination Guidelines: A complete evaluation includes B-mode imaging, spectral Doppler, color Doppler, and power Doppler as needed of all accessible portions of each vessel. Bilateral testing is considered an integral part of a complete examination. Limited examinations for reoccurring indications may be performed as noted.  Right Venous Findings: +---------+---------------+---------+-----------+----------+-----------------+          CompressibilityPhasicitySpontaneityPropertiesSummary           +---------+---------------+---------+-----------+----------+-----------------+ CFV      Full           Yes      Yes                                    +---------+---------------+---------+-----------+----------+-----------------+ SFJ      Full                                                           +---------+---------------+---------+-----------+----------+-----------------+ FV Prox  Full                                                           +---------+---------------+---------+-----------+----------+-----------------+  FV Mid   Full                                                           +---------+---------------+---------+-----------+----------+-----------------+ FV DistalFull                                                           +---------+---------------+---------+-----------+----------+-----------------+ PFV      Full                                                            +---------+---------------+---------+-----------+----------+-----------------+ POP      Full           Yes      Yes                                    +---------+---------------+---------+-----------+----------+-----------------+ PTV      Full                                                           +---------+---------------+---------+-----------+----------+-----------------+ PERO     Full                                                           +---------+---------------+---------+-----------+----------+-----------------+ SSV      None                                         Age Indeterminate +---------+---------------+---------+-----------+----------+-----------------+  Left Venous Findings: +---+---------------+---------+-----------+----------+-------+    CompressibilityPhasicitySpontaneityPropertiesSummary +---+---------------+---------+-----------+----------+-------+ CFVFull           Yes      Yes                          +---+---------------+---------+-----------+----------+-------+    Summary: Right: Findings consistent with age indeterminate superficial vein thrombosis involving the right small saphenous vein. There is no evidence of deep vein thrombosis in the lower extremity. A cystic structure measuring 2 cm high by 4 cm wide by greater than 4.7 cm long is found in the popliteal fossa.  *See table(s) above for measurements and observations. Electronically signed by Monica Martinez MD on 11/02/2018 at 1:59:43 PM.    Final     EKG: Independently reviewed. 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   Assessment/Plan Gout flare, severe pain, inability to walk - His gout is not crystal proven; I would consider speaking  with IR tomorrow about getting fluid from his right ankle to evaluate for crystals.   - In the interim - oral steroids and colchicine will be started - Pain control with IV morphine and PO oxycodone - I continued doxycycline for  now, though I do not have a strong concern for infection at this time, again, arthrocentesis would be helpful here.  His history of recurrent painful swelling every 2-3 weeks would not be consistent with an infection.  - Hold lasix in short term - Noted ultrasound from yesterday showing superficial clot, would not treat at this time.  Consider repeat US in 1 week to see if clot migrated.  - Hold allopurinol.  If he truly is having such severe gout, he may need another uric acid lowering agent in future.   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 - appears to be at baseline - IVF 2L given in the ED - Trend and monitor closely - Holding lasix    Hyperlipidemia associated with type 2 diabetes mellitus - Continue home lovaza and atorvastatin    Hypertension associated with diabetes - Hold lasix,  - Continue metoprolol    Type 2 diabetes mellitus - Continue home short acting insulin - SSI while on prednisone - Consider starting long acting insulin    History of BPH - Monitor for urinary retention   DVT prophylaxis: Lovenox Code Status: Full Family Communication: Wife and daughter at bedside  Disposition Plan: Admit for pain control Consults called: None Admission status: Med-surg, obs   Gilles Chiquito MD [facility] Pager [phone]  If 7PM-7AM, please contact night-coverage [email or web address] Password [facility]  11/03/2018, 4:58 PM

## 2018-11-04 DIAGNOSIS — Z87438 Personal history of other diseases of male genital organs: Secondary | ICD-10-CM

## 2018-11-04 LAB — HIV ANTIBODY (ROUTINE TESTING W REFLEX): HIV Screen 4th Generation wRfx: NONREACTIVE

## 2018-11-04 LAB — CBC
HCT: 34 % — ABNORMAL LOW (ref 39.0–52.0)
Hemoglobin: 11.3 g/dL — ABNORMAL LOW (ref 13.0–17.0)
MCH: 30.5 pg (ref 26.0–34.0)
MCHC: 33.2 g/dL (ref 30.0–36.0)
MCV: 91.9 fL (ref 80.0–100.0)
NRBC: 0 % (ref 0.0–0.2)
PLATELETS: 143 10*3/uL — AB (ref 150–400)
RBC: 3.7 MIL/uL — ABNORMAL LOW (ref 4.22–5.81)
RDW: 15.7 % — AB (ref 11.5–15.5)
WBC: 7.9 10*3/uL (ref 4.0–10.5)

## 2018-11-04 LAB — COMPREHENSIVE METABOLIC PANEL
ALBUMIN: 2.3 g/dL — AB (ref 3.5–5.0)
ALK PHOS: 36 U/L — AB (ref 38–126)
ALT: 23 U/L (ref 0–44)
ANION GAP: 9 (ref 5–15)
AST: 23 U/L (ref 15–41)
BUN: 31 mg/dL — ABNORMAL HIGH (ref 8–23)
CALCIUM: 8.3 mg/dL — AB (ref 8.9–10.3)
CO2: 21 mmol/L — AB (ref 22–32)
CREATININE: 1.87 mg/dL — AB (ref 0.61–1.24)
Chloride: 106 mmol/L (ref 98–111)
Glucose, Bld: 194 mg/dL — ABNORMAL HIGH (ref 70–99)
Potassium: 3.9 mmol/L (ref 3.5–5.1)
SODIUM: 136 mmol/L (ref 135–145)
Total Bilirubin: 1.1 mg/dL (ref 0.3–1.2)
Total Protein: 6.3 g/dL — ABNORMAL LOW (ref 6.5–8.1)

## 2018-11-04 LAB — SEDIMENTATION RATE: SED RATE: 123 mm/h — AB (ref 0–16)

## 2018-11-04 LAB — URINE CULTURE: Culture: NO GROWTH

## 2018-11-04 LAB — GLUCOSE, CAPILLARY
Glucose-Capillary: 164 mg/dL — ABNORMAL HIGH (ref 70–99)
Glucose-Capillary: 165 mg/dL — ABNORMAL HIGH (ref 70–99)
Glucose-Capillary: 227 mg/dL — ABNORMAL HIGH (ref 70–99)
Glucose-Capillary: 264 mg/dL — ABNORMAL HIGH (ref 70–99)

## 2018-11-04 LAB — C-REACTIVE PROTEIN: CRP: 46.2 mg/dL — AB (ref <1.0)

## 2018-11-04 MED ORDER — METHYLPREDNISOLONE SODIUM SUCC 40 MG IJ SOLR
40.0000 mg | INTRAMUSCULAR | Status: DC
  Administered 2018-11-04 – 2018-11-05 (×2): 40 mg via INTRAVENOUS
  Filled 2018-11-04 (×2): qty 1

## 2018-11-04 MED ORDER — SODIUM CHLORIDE 0.9 % IV SOLN
INTRAVENOUS | Status: DC
  Administered 2018-11-04 – 2018-11-07 (×6): via INTRAVENOUS

## 2018-11-04 NOTE — Progress Notes (Signed)
PROGRESS NOTE    Jerry York  VXB:[id number] DOB: 1951/10/02 DOA: 11/03/2018 PCP: [name], MD    Brief Narrative:  67 y.o. male with medical history significant of DM2, CKD, HTN, HLD, anxiety and gout who presents with acute gout flare.  He was seen in the ED yesterday for the same.  He reports that the pain started in his ankle of his right foot and has progressed to his complete foot and with shooting pain up his legs.  He has had chills and erythema and swelling on the right side.  No open wounds or skin breakdown.  He presented to the [facility] ED yesterday and was advised to do a trial at home.  He was sent home with hydrocodone and doxycycline.  He has not been able to afford colchicine recently.  He did not tolerate being at home as he could not walk to the bathroom and was not able to get himself food.  There is no one at home able to help him move to the bathroom.  He is very concerned about going home.  His history of gout is long, however, he has never had a crystal diagnosis.  This is similar to previous episodes which he has been told is gout.  He has an elevated uric acid, last checked in 2018.  Nothing really seems to make the episodes improve however, he just waits it out.  The pain is now moving in to his left foot and he is unable to walk.   He has been having these episodes every 2-3 weeks.  Nothing much helps and they resolve on their own.  He is chronically on lasix.   ED Course: In the ED, he was given fluids and pain medication with limited improvement in his pain.  His wife is very concerned with his going home.   Assessment & Plan:   Active Problems:   Hyperlipidemia associated with type 2 diabetes mellitus ([place])   Hypertension associated with diabetes ([place])   Type 2 diabetes mellitus ([place])   History of BPH   Gout flare   Elevated lactic acid level   Foot pain, right   Unsteady gait  Gout flare, severe pain, inability to walk - His gout is not crystal  proven -Hx of hyperuricemia -R ankle without overlying erythema -Checked CRP and ESR, both markedly elevated at 42 and 123, respectively -No leukocytosis. Afebrile -Will d/c abx -Given inflammation, will benefit from IV steroids. Started IV solumedrol 38m daily -Continued on colchicine - Pain control with morphine as needed -Clinically dehydrated with poor mucus membranes. 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 Basal IVF continued    Hyperlipidemia associated with type 2 diabetes mellitus - Continue home lovaza and atorvastatin as tolerated    Hypertension associated with diabetes - Lasix on hold - Continue metoprolol -Stable at present    Type 2 diabetes mellitus - Continue home short acting insulin - SSI while on prednisone - Consider starting long acting insulin    History of BPH - Appears stable at present - Low threshold for bladder scanning    DVT prophylaxis: Lovenox subq Code Status: Full Family Communication: Pt in room, family not at bedside Disposition Plan: Uncertain at this time  Consultants:     Procedures:     Antimicrobials: Anti-infectives (From admission, onward)   Start     Dose/Rate Route  Frequency Ordered Stop   11/03/18 2200  doxycycline (VIBRA-TABS) tablet 100 mg  Status:  Discontinued     100 mg Oral 2 times daily 11/03/18 1754 11/04/18 0950       Subjective: Complaining of marked R ankle pain. Did not receive steroid overnight  Objective: Vitals:   11/03/18 1746 11/03/18 2027 11/04/18 0521 11/04/18 1323  BP: (!) 152/84 (!) 142/82 134/80 130/73  Pulse: (!) 117 (!) 122 (!) 109 96  Resp: (!) _0 Temp: 97.8 F (36.6 C) 98 F (36.7 C) 98.7 F (37.1 C) 98.4 F (36.9 C)  TempSrc: Oral Oral Oral Oral  SpO2: 91% 90% 92% 91%  Weight:      Height:        Intake/Output Summary (Last 24 hours) at 11/04/2018 1341 Last data  filed at 11/04/2018 1224 Gross per 24 hour  Intake 1390.05 ml  Output 1625 ml  Net -234.95 ml   Filed Weights   11/03/18 1333  Weight: 83.9 kg    Examination:  General exam: Appears calm and comfortable  Respiratory system: Clear to auscultation. Respiratory effort normal. Cardiovascular system: S1 & S2 heard, RRR Gastrointestinal system: Abdomen is nondistended, soft and nontender. No organomegaly or masses felt. Normal bowel sounds heard. Central nervous system: Alert and oriented. No focal neurological deficits. Extremities: Symmetric 5 x 5 power, R ankle swollen, markedly tender Skin: No rashes, lesions Psychiatry: Judgement and insight appear normal. Mood & affect appropriate.   Data Reviewed: I have personally reviewed following labs and imaging studies  CBC: Recent Labs  Lab 11/02/18 1231 11/03/18 1407 11/03/18 1806 11/04/18 0413  WBC 10.0 9.5 9.3 7.9  NEUTROABS  --  7.7  --   --   HGB 13.8 12.3* 11.8* 11.3*  HCT 41.3 39.0 37.0* 34.0*  MCV 90.8 92.6 91.4 91.9  PLT 141* 173 161 715*   Basic Metabolic Panel: Recent Labs  Lab 11/02/18 1231 11/03/18 1407 11/03/18 1806 11/04/18 0413  NA 138 136  --  136  K 3.8 3.9  --  3.9  CL 100 101  --  106  CO2 25 24  --  21*  GLUCOSE 207* 174*  --  194*  BUN 33* 27*  --  31*  CREATININE 1.66* 1.61* 1.61* 1.87*  CALCIUM 8.7* 8.7*  --  8.3*   GFR: Estimated Creatinine Clearance: 39.5 mL/min (A) (by C-G formula based on SCr of 1.87 mg/dL (H)). Liver Function Tests: Recent Labs  Lab 11/04/18 0413  AST 23  ALT 23  ALKPHOS 36*  BILITOT 1.1  PROT 6.3*  ALBUMIN 2.3*   No results for input(s): LIPASE, AMYLASE in the last 168 hours. No results for input(s): AMMONIA in the last 168 hours. Coagulation Profile: No results for input(s): INR, PROTIME in the last 168 hours. Cardiac Enzymes: No results for input(s): CKTOTAL, CKMB, CKMBINDEX, TROPONINI in the last 168 hours. BNP (last 3 results) No results for input(s):  PROBNP in the last 8760 hours. HbA1C: No results for input(s): HGBA1C in the last 72 hours. CBG: Recent Labs  Lab 11/03/18 1811 11/03/18 2027 11/04/18 0738 11/04/18 1223  GLUCAP 163* 180* 164* 165*   Lipid Profile: No results for input(s): CHOL, HDL, LDLCALC, TRIG, CHOLHDL, LDLDIRECT in the last 72 hours. Thyroid Function Tests: No results for input(s): TSH, T4TOTAL, FREET4, T3FREE, THYROIDAB in the last 72 hours. Anemia Panel: No results for input(s): VITAMINB12, FOLATE, FERRITIN, TIBC, IRON, RETICCTPCT in the last 72 hours. Sepsis Labs: Recent Labs  Lab 11/02/18 1659 11/02/18 2008 11/03/18 1419 11/03/18 1612  LATICACIDVEN 1.39 1.47 2.41* 1.16    Recent Results (from the past 240 hour(s))  Culture, blood (routine x 2)     Status: None (Preliminary result)   Collection Time: 11/02/18  1:59 PM  Result Value Ref Range Status   Specimen Description   Final    BLOOD LEFT ANTECUBITAL Performed at [facility] [address], [place], [place] [address]    Special Requests   Final    BOTTLES DRAWN AEROBIC AND ANAEROBIC Blood Culture results may not be optimal due to an excessive volume of blood received in culture bottles Performed at [facility] [address] [place], [place] [address]    Culture   Final    NO GROWTH 2 DAYS Performed at [facility] [address] [place], [place] [address]    Report Status PENDING  Incomplete  Culture, blood (routine x 2)     Status: None (Preliminary result)   Collection Time: 11/02/18  2:17 PM  Result Value Ref Range Status   Specimen Description   Final    BLOOD LEFT ANTECUBITAL Performed at [facility] [address] [place], [place] [address]    Special Requests   Final    BOTTLES DRAWN AEROBIC AND ANAEROBIC Blood Culture adequate volume Performed at [facility] [address], [place], [place] [address]    Culture   Final    NO  GROWTH 2 DAYS Performed at [facility] [address] [place], [place] [address]    Report Status PENDING  Incomplete  Urine culture     Status: None   Collection Time: 11/02/18  8:01 PM  Result Value Ref Range Status   Specimen Description   Final    URINE, RANDOM Performed at [facility] [address] [place], [place] [address]    Special Requests   Final    NONE Performed at [facility], [place] [address] [place], [place] [address]    Culture   Final    NO GROWTH Performed at [facility], [place] [address] [place],  [address]    Report Status 11/04/2018 FINAL  Final     Radiology Studies: Dg Chest 2 View  Result Date: 11/02/2018 CLINICAL DATA:  Status post fall.  Hypertension and diabetes. EXAM: CHEST - 2 VIEW COMPARISON:  August 08, 2017 FINDINGS: The heart size and mediastinal contours are stable. The heart size is probably enlarged. There is chronic elevation of right hemidiaphragm unchanged. Both lungs are clear. The visualized skeletal structures are unremarkable. IMPRESSION: No active cardiopulmonary disease. Electronically Signed   By: Abelardo Diesel M.D.   On: 11/02/2018 15:56   Dg Tibia/fibula Right  Result Date: 11/02/2018 CLINICAL DATA:  Status post fall with right lower leg pain. EXAM: RIGHT TIBIA AND FIBULA - 2 VIEW COMPARISON:  None. FINDINGS: There is no evidence of fracture or dislocation. Degenerative joint changes of the right knee and right ankle are identified. Soft tissues are unremarkable. IMPRESSION: No acute fracture or dislocation. Electronically Signed   By: Abelardo Diesel M.D.   On: 11/02/2018 15:57    Scheduled Meds: . amLODipine  5 mg Oral Daily  . aspirin  81 mg Oral Daily  . atorvastatin  20 mg Oral q1800  . clonazePAM  0.5 mg Oral BID  . colchicine  0.6 mg Oral Daily  . enoxaparin (  LOVENOX) injection  40 mg Subcutaneous Q24H  . insulin aspart  0-15 Units Subcutaneous TID WC  .  insulin aspart  0-5 Units Subcutaneous QHS  . insulin aspart  30 Units Subcutaneous TID WC  . methylPREDNISolone (SOLU-MEDROL) injection  40 mg Intravenous Q24H  . metoprolol tartrate  100 mg Oral BID  . predniSONE  50 mg Oral Q breakfast   Continuous Infusions: . sodium chloride 75 mL/hr at 11/04/18 1213     LOS: 0 days   Marylu Lund, MD [facility] Pager On Amion  If 7PM-7AM, please contact night-coverage 11/04/2018, 1:41 PM

## 2018-11-04 NOTE — Care Management Obs Status (Signed)
MEDICARE OBSERVATION STATUS NOTIFICATION   Patient Details  Name: LANCE HUARACHA MRN: [id number] Date of Birth: 1951/11/17   Medicare Observation Status Notification Given:  Yes    Lawerance Sabal, RN 11/04/2018, 3:35 PM

## 2018-11-05 ENCOUNTER — Telehealth: Payer: Self-pay | Admitting: Family Medicine

## 2018-11-05 ENCOUNTER — Other Ambulatory Visit: Payer: Self-pay

## 2018-11-05 ENCOUNTER — Observation Stay (HOSPITAL_COMMUNITY): Payer: PPO

## 2018-11-05 DIAGNOSIS — N182 Chronic kidney disease, stage 2 (mild): Secondary | ICD-10-CM | POA: Diagnosis present

## 2018-11-05 DIAGNOSIS — Z973 Presence of spectacles and contact lenses: Secondary | ICD-10-CM | POA: Diagnosis not present

## 2018-11-05 DIAGNOSIS — Z888 Allergy status to other drugs, medicaments and biological substances status: Secondary | ICD-10-CM | POA: Diagnosis not present

## 2018-11-05 DIAGNOSIS — F419 Anxiety disorder, unspecified: Secondary | ICD-10-CM | POA: Diagnosis present

## 2018-11-05 DIAGNOSIS — Z23 Encounter for immunization: Secondary | ICD-10-CM | POA: Diagnosis present

## 2018-11-05 DIAGNOSIS — R262 Difficulty in walking, not elsewhere classified: Secondary | ICD-10-CM | POA: Diagnosis present

## 2018-11-05 DIAGNOSIS — M10079 Idiopathic gout, unspecified ankle and foot: Secondary | ICD-10-CM | POA: Diagnosis not present

## 2018-11-05 DIAGNOSIS — E785 Hyperlipidemia, unspecified: Secondary | ICD-10-CM | POA: Diagnosis present

## 2018-11-05 DIAGNOSIS — M79672 Pain in left foot: Secondary | ICD-10-CM

## 2018-11-05 DIAGNOSIS — Z7982 Long term (current) use of aspirin: Secondary | ICD-10-CM | POA: Diagnosis not present

## 2018-11-05 DIAGNOSIS — J9811 Atelectasis: Secondary | ICD-10-CM | POA: Diagnosis not present

## 2018-11-05 DIAGNOSIS — Z87438 Personal history of other diseases of male genital organs: Secondary | ICD-10-CM | POA: Diagnosis not present

## 2018-11-05 DIAGNOSIS — E860 Dehydration: Secondary | ICD-10-CM | POA: Diagnosis present

## 2018-11-05 DIAGNOSIS — R2681 Unsteadiness on feet: Secondary | ICD-10-CM | POA: Diagnosis present

## 2018-11-05 DIAGNOSIS — M109 Gout, unspecified: Secondary | ICD-10-CM | POA: Diagnosis present

## 2018-11-05 DIAGNOSIS — R7989 Other specified abnormal findings of blood chemistry: Secondary | ICD-10-CM | POA: Diagnosis not present

## 2018-11-05 DIAGNOSIS — E1159 Type 2 diabetes mellitus with other circulatory complications: Secondary | ICD-10-CM | POA: Diagnosis not present

## 2018-11-05 DIAGNOSIS — Z8349 Family history of other endocrine, nutritional and metabolic diseases: Secondary | ICD-10-CM | POA: Diagnosis not present

## 2018-11-05 DIAGNOSIS — Z79891 Long term (current) use of opiate analgesic: Secondary | ICD-10-CM | POA: Diagnosis not present

## 2018-11-05 DIAGNOSIS — M25472 Effusion, left ankle: Secondary | ICD-10-CM | POA: Diagnosis present

## 2018-11-05 DIAGNOSIS — Z8249 Family history of ischemic heart disease and other diseases of the circulatory system: Secondary | ICD-10-CM | POA: Diagnosis not present

## 2018-11-05 DIAGNOSIS — Z794 Long term (current) use of insulin: Secondary | ICD-10-CM | POA: Diagnosis not present

## 2018-11-05 DIAGNOSIS — I152 Hypertension secondary to endocrine disorders: Secondary | ICD-10-CM | POA: Diagnosis present

## 2018-11-05 DIAGNOSIS — Z882 Allergy status to sulfonamides status: Secondary | ICD-10-CM | POA: Diagnosis not present

## 2018-11-05 DIAGNOSIS — Z79899 Other long term (current) drug therapy: Secondary | ICD-10-CM | POA: Diagnosis not present

## 2018-11-05 DIAGNOSIS — E1122 Type 2 diabetes mellitus with diabetic chronic kidney disease: Secondary | ICD-10-CM | POA: Diagnosis present

## 2018-11-05 DIAGNOSIS — M25471 Effusion, right ankle: Secondary | ICD-10-CM | POA: Diagnosis present

## 2018-11-05 DIAGNOSIS — Z87891 Personal history of nicotine dependence: Secondary | ICD-10-CM | POA: Diagnosis not present

## 2018-11-05 DIAGNOSIS — N400 Enlarged prostate without lower urinary tract symptoms: Secondary | ICD-10-CM | POA: Diagnosis present

## 2018-11-05 DIAGNOSIS — M10011 Idiopathic gout, right shoulder: Secondary | ICD-10-CM | POA: Diagnosis not present

## 2018-11-05 DIAGNOSIS — E1169 Type 2 diabetes mellitus with other specified complication: Secondary | ICD-10-CM | POA: Diagnosis present

## 2018-11-05 DIAGNOSIS — R011 Cardiac murmur, unspecified: Secondary | ICD-10-CM | POA: Diagnosis present

## 2018-11-05 LAB — SYNOVIAL CELL COUNT + DIFF, W/ CRYSTALS
LYMPHOCYTES-SYNOVIAL FLD: 2 % (ref 0–20)
LYMPHOCYTES-SYNOVIAL FLD: 2 % (ref 0–20)
MONOCYTE-MACROPHAGE-SYNOVIAL FLUID: 5 % — AB (ref 50–90)
Monocyte-Macrophage-Synovial Fluid: 3 % — ABNORMAL LOW (ref 50–90)
NEUTROPHIL, SYNOVIAL: 93 % — AB (ref 0–25)
Neutrophil, Synovial: 95 % — ABNORMAL HIGH (ref 0–25)
WBC, SYNOVIAL: 4850 /mm3 — AB (ref 0–200)
WBC, Synovial: 20100 /mm3 — ABNORMAL HIGH (ref 0–200)

## 2018-11-05 LAB — GLUCOSE, CAPILLARY
GLUCOSE-CAPILLARY: 208 mg/dL — AB (ref 70–99)
GLUCOSE-CAPILLARY: 300 mg/dL — AB (ref 70–99)
GLUCOSE-CAPILLARY: 357 mg/dL — AB (ref 70–99)
Glucose-Capillary: 267 mg/dL — ABNORMAL HIGH (ref 70–99)

## 2018-11-05 LAB — BASIC METABOLIC PANEL
ANION GAP: 6 (ref 5–15)
BUN: 44 mg/dL — ABNORMAL HIGH (ref 8–23)
CHLORIDE: 104 mmol/L (ref 98–111)
CO2: 26 mmol/L (ref 22–32)
Calcium: 8.3 mg/dL — ABNORMAL LOW (ref 8.9–10.3)
Creatinine, Ser: 1.62 mg/dL — ABNORMAL HIGH (ref 0.61–1.24)
GFR calc non Af Amer: 43 mL/min — ABNORMAL LOW (ref >60)
GFR, EST AFRICAN AMERICAN: 49 mL/min — AB (ref >60)
Glucose, Bld: 246 mg/dL — ABNORMAL HIGH (ref 70–99)
POTASSIUM: 4 mmol/L (ref 3.5–5.1)
Sodium: 136 mmol/L (ref 135–145)

## 2018-11-05 LAB — SEDIMENTATION RATE: Sed Rate: 127 mm/hr — ABNORMAL HIGH (ref 0–16)

## 2018-11-05 MED ORDER — INSULIN ASPART 100 UNIT/ML ~~LOC~~ SOLN
6.0000 [IU] | Freq: Three times a day (TID) | SUBCUTANEOUS | Status: DC
  Administered 2018-11-05 – 2018-11-08 (×11): 6 [IU] via SUBCUTANEOUS

## 2018-11-05 MED ORDER — METHYLPREDNISOLONE ACETATE 40 MG/ML IJ SUSP
40.0000 mg | Freq: Once | INTRAMUSCULAR | Status: DC
  Filled 2018-11-05: qty 1

## 2018-11-05 MED ORDER — METHYLPREDNISOLONE SODIUM SUCC 125 MG IJ SOLR
60.0000 mg | INTRAMUSCULAR | Status: DC
  Administered 2018-11-06 – 2018-11-09 (×4): 60 mg via INTRAVENOUS
  Filled 2018-11-05 (×4): qty 2

## 2018-11-05 MED ORDER — BUPIVACAINE HCL (PF) 0.5 % IJ SOLN
10.0000 mL | Freq: Once | INTRAMUSCULAR | Status: DC
  Filled 2018-11-05: qty 10

## 2018-11-05 MED ORDER — METHYLPREDNISOLONE SODIUM SUCC 40 MG IJ SOLR
20.0000 mg | Freq: Once | INTRAMUSCULAR | Status: AC
  Administered 2018-11-06: 20 mg via INTRAVENOUS
  Filled 2018-11-05: qty 1

## 2018-11-05 NOTE — Consult Note (Signed)
Reason for Consult:Ankle pain Referring Physician: [name] is an 68 y.o. male.  HPI: Jerry York was admitted with severe ankle pain on 11/9. This is bilateral was worse on the right at admission. He notes he has had significant problems with his ankles since January of this year when he underwent prostate surgery. Prior to that he had intermittent problems that started sometime in 2018. This has been diagnosed and treated as gout with some success but he has never had a joint aspiration to confirm. He first had it in his big toe but now will attack him anywhere. This year he notes symptom resolution only for a few weeks before it comes back. He denies fevers, chills, sweats, N/V. He went to [facility] earlier this year and was diagnosed with severe OA.  Past Medical History:  Diagnosis Date  . Anxiety   . Bilateral renal cysts   . Foley catheter in place   . Gout    02-22-2017 acute right foot gout---  per pt resolved  . Gross hematuria   . Heart murmur   . Hydronephrosis, left   . Hyperlipidemia   . Hypertension   . Renal insufficiency   . Type 2 diabetes mellitus ([place])   . Urinary retention   . Wears glasses     Past Surgical History:  Procedure Laterality Date  . APPENDECTOMY  age 12  . CYSTOSCOPY WITH URETEROSCOPY AND STENT PLACEMENT Left 05/08/2017   Procedure: URETEROSCOPY AND STENT PLACEMENT;  Surgeon: Kathie Rhodes, MD;  Location[facility];  Service: Urology;  Laterality: Left;  . CYSTOSCOPY/RETROGRADE/URETEROSCOPY Bilateral 05/08/2017   Procedure: CYSTOSCOPY/ BILATERAL RETROGRADE;  Surgeon: Kathie Rhodes, MD;  Location: [facility];  Service: Urology;  Laterality: Bilateral;  . INGUINAL HERNIA REPAIR Right 10/10/2000  . KNEE ARTHROSCOPY Right 1980's  . SHOULDER ARTHROSCOPY WITH OPEN ROTATOR CUFF REPAIR Right 1990's  . TRANSURETHRAL RESECTION OF PROSTATE      Family History  Problem Relation Age of Onset  . Congestive  Heart Failure Mother        Related to valve disease.  Opted to treat medically  . Hypertension Mother   . Hyperlipidemia Mother   . Hypertension Father   . Hyperlipidemia Father   . Coronary artery disease Father 48       History of CABG  . Hypertension Brother   . Hypertension Daughter   . Hypertension Son     Social History:  reports that he has quit smoking. He has a 0.25 pack-year smoking history. He has never used smokeless tobacco. He reports that he does not drink alcohol or use drugs.  Allergies:  Allergies  Allergen Reactions  . Prednisone Nausea And Vomiting    Elevated blood sugar  . Metformin And Related Diarrhea  . Sulfa Antibiotics Other (See Comments)    "make my feet burn"    Medications: I have reviewed the patient's current medications.  Results for orders placed or performed during the hospital encounter of 11/03/18 (from the past 48 hour(s))  Basic metabolic panel     Status: Abnormal   Collection Time: 11/03/18  2:07 PM  Result Value Ref Range   Sodium 136 135 - 145 mmol/L   Potassium 3.9 3.5 - 5.1 mmol/L   Chloride 101 98 - 111 mmol/L   CO2 24 22 - 32 mmol/L   Glucose, Bld 174 (H) 70 - 99 mg/dL   BUN 27 (H) 8 - 23 mg/dL  Creatinine, Ser 1.61 (H) 0.61 - 1.24 mg/dL   Calcium 8.7 (L) 8.9 - 10.3 mg/dL   GFR calc non Af Amer 43 (L) >60 mL/min   GFR calc Af Amer 50 (L) >60 mL/min    Comment: (NOTE) The eGFR has been calculated using the CKD EPI equation. This calculation has not been validated in all clinical situations. eGFR's persistently <60 mL/min signify possible Chronic Kidney Disease.    Anion gap 11 5 - 15    Comment: Performed at [facility] [address] [place], [place] [address]  CBC with Differential     Status: Abnormal   Collection Time: 11/03/18  2:07 PM  Result Value Ref Range   WBC 9.5 4.0 - 10.5 K/uL   RBC 4.21 (L) 4.22 - 5.81 MIL/uL   Hemoglobin 12.3 (L) 13.0 - 17.0 g/dL   HCT 39.0 39.0 - 52.0 %   MCV 92.6 80.0 -  100.0 fL   MCH 29.2 26.0 - 34.0 pg   MCHC 31.5 30.0 - 36.0 g/dL   RDW 15.6 (H) 11.5 - 15.5 %   Platelets 173 150 - 400 K/uL   nRBC 0.0 0.0 - 0.2 %   Neutrophils Relative % 81 %   Neutro Abs 7.7 1.7 - 7.7 K/uL   Lymphocytes Relative 10 %   Lymphs Abs 1.0 0.7 - 4.0 K/uL   Monocytes Relative 8 %   Monocytes Absolute 0.8 0.1 - 1.0 K/uL   Eosinophils Relative 0 %   Eosinophils Absolute 0.0 0.0 - 0.5 K/uL   Basophils Relative 0 %   Basophils Absolute 0.0 0.0 - 0.1 K/uL   WBC Morphology See Note     Comment: Dohle Bodies Vaculated Neutrophils   Immature Granulocytes 1 %   Abs Immature Granulocytes 0.09 (H) 0.00 - 0.07 K/uL   Tear Drop Cells PRESENT     Comment: Performed at [facility], [place] [address], [place], [place] [address]  I-Stat CG4 Lactic Acid, ED     Status: Abnormal   Collection Time: 11/03/18  2:19 PM  Result Value Ref Range   Lactic Acid, Venous 2.41 (HH) 0.5 - 1.9 mmol/L   Comment NOTIFIED PHYSICIAN   I-Stat CG4 Lactic Acid, ED     Status: None   Collection Time: 11/03/18  4:12 PM  Result Value Ref Range   Lactic Acid, Venous 1.16 0.5 - 1.9 mmol/L  HIV antibody (Routine Testing)     Status: None   Collection Time: 11/03/18  6:06 PM  Result Value Ref Range   HIV Screen 4th Generation wRfx Non Reactive Non Reactive    Comment: (NOTE) Performed At: [facility] [place], [place] [id number] Rush Farmer MD BP:[id number]   CBC     Status: Abnormal   Collection Time: 11/03/18  6:06 PM  Result Value Ref Range   WBC 9.3 4.0 - 10.5 K/uL   RBC 4.05 (L) 4.22 - 5.81 MIL/uL   Hemoglobin 11.8 (L) 13.0 - 17.0 g/dL   HCT 37.0 (L) 39.0 - 52.0 %   MCV 91.4 80.0 - 100.0 fL   MCH 29.1 26.0 - 34.0 pg   MCHC 31.9 30.0 - 36.0 g/dL   RDW 15.8 (H) 11.5 - 15.5 %   Platelets 161 150 - 400 K/uL   nRBC 0.0 0.0 - 0.2 %    Comment: Performed at [facility], [place] [address], [place],  [address]  Creatinine, serum     Status:  Abnormal    Collection Time: 11/03/18  6:06 PM  Result Value Ref Range   Creatinine, Ser 1.61 (H) 0.61 - 1.24 mg/dL   GFR calc non Af Amer 43 (L) >60 mL/min   GFR calc Af Amer 50 (L) >60 mL/min    Comment: (NOTE) The eGFR has been calculated using the CKD EPI equation. This calculation has not been validated in all clinical situations. eGFR's persistently <60 mL/min signify possible Chronic Kidney Disease. Performed at [facility], [place] [address], [place], [place] [address]   Glucose, capillary     Status: Abnormal   Collection Time: 11/03/18  6:11 PM  Result Value Ref Range   Glucose-Capillary 163 (H) 70 - 99 mg/dL  Glucose, capillary     Status: Abnormal   Collection Time: 11/03/18  8:27 PM  Result Value Ref Range   Glucose-Capillary 180 (H) 70 - 99 mg/dL  Comprehensive metabolic panel     Status: Abnormal   Collection Time: 11/04/18  4:13 AM  Result Value Ref Range   Sodium 136 135 - 145 mmol/L   Potassium 3.9 3.5 - 5.1 mmol/L   Chloride 106 98 - 111 mmol/L   CO2 21 (L) 22 - 32 mmol/L   Glucose, Bld 194 (H) 70 - 99 mg/dL   BUN 31 (H) 8 - 23 mg/dL   Creatinine, Ser 1.87 (H) 0.61 - 1.24 mg/dL   Calcium 8.3 (L) 8.9 - 10.3 mg/dL   Total Protein 6.3 (L) 6.5 - 8.1 g/dL   Albumin 2.3 (L) 3.5 - 5.0 g/dL   AST 23 15 - 41 U/L   ALT 23 0 - 44 U/L   Alkaline Phosphatase 36 (L) 38 - 126 U/L   Total Bilirubin 1.1 0.3 - 1.2 mg/dL   GFR calc non Af Amer NOT CALCULATED >60 mL/min   GFR calc Af Amer NOT CALCULATED >60 mL/min    Comment: (NOTE) The eGFR has been calculated using the CKD EPI equation. This calculation has not been validated in all clinical situations. eGFR's persistently <60 mL/min signify possible Chronic Kidney Disease.    Anion gap 9 5 - 15    Comment: Performed at [facility] [address] [place], [place] [address]  CBC     Status: Abnormal   Collection Time: 11/04/18  4:13 AM  Result Value Ref Range   WBC 7.9 4.0 - 10.5 K/uL   RBC 3.70 (L) 4.22 - 5.81  MIL/uL   Hemoglobin 11.3 (L) 13.0 - 17.0 g/dL   HCT 34.0 (L) 39.0 - 52.0 %   MCV 91.9 80.0 - 100.0 fL   MCH 30.5 26.0 - 34.0 pg   MCHC 33.2 30.0 - 36.0 g/dL   RDW 15.7 (H) 11.5 - 15.5 %   Platelets 143 (L) 150 - 400 K/uL   nRBC 0.0 0.0 - 0.2 %    Comment: Performed at [facility], [place] [address], [place], [place] [address]  Glucose, capillary     Status: Abnormal   Collection Time: 11/04/18  7:38 AM  Result Value Ref Range   Glucose-Capillary 164 (H) 70 - 99 mg/dL  Sedimentation rate     Status: Abnormal   Collection Time: 11/04/18  9:57 AM  Result Value Ref Range   Sed Rate 123 (H) 0 - 16 mm/hr    Comment: Performed at [facility], [place] [address], [place], [place] [address]  C-reactive protein     Status: Abnormal  Collection Time: 11/04/18  9:57 AM  Result Value Ref Range   CRP 46.2 (H) <1.0 mg/dL    Comment: Performed at [facility] [address], [place], [place] [address]  Glucose, capillary     Status: Abnormal   Collection Time: 11/04/18 12:23 PM  Result Value Ref Range   Glucose-Capillary 165 (H) 70 - 99 mg/dL  Glucose, capillary     Status: Abnormal   Collection Time: 11/04/18  5:02 PM  Result Value Ref Range   Glucose-Capillary 227 (H) 70 - 99 mg/dL  Glucose, capillary     Status: Abnormal   Collection Time: 11/04/18  9:04 PM  Result Value Ref Range   Glucose-Capillary 264 (H) 70 - 99 mg/dL  Basic metabolic panel     Status: Abnormal   Collection Time: 11/05/18  1:40 AM  Result Value Ref Range   Sodium 136 135 - 145 mmol/L   Potassium 4.0 3.5 - 5.1 mmol/L   Chloride 104 98 - 111 mmol/L   CO2 26 22 - 32 mmol/L   Glucose, Bld 246 (H) 70 - 99 mg/dL   BUN 44 (H) 8 - 23 mg/dL   Creatinine, Ser 1.62 (H) 0.61 - 1.24 mg/dL   Calcium 8.3 (L) 8.9 - 10.3 mg/dL   GFR calc non Af Amer 43 (L) >60 mL/min   GFR calc Af Amer 49 (L) >60 mL/min    Comment: (NOTE) The eGFR has been calculated using the CKD EPI equation. This calculation has not been  validated in all clinical situations. eGFR's persistently <60 mL/min signify possible Chronic Kidney Disease.    Anion gap 6 5 - 15    Comment: Performed at [facility] [address], [place], [place] [address]  Sedimentation rate     Status: Abnormal   Collection Time: 11/05/18  1:40 AM  Result Value Ref Range   Sed Rate 127 (H) 0 - 16 mm/hr    Comment: Performed at [facility] [address], [place], [place] [address]  Glucose, capillary     Status: Abnormal   Collection Time: 11/05/18  8:34 AM  Result Value Ref Range   Glucose-Capillary 208 (H) 70 - 99 mg/dL    Dg Chest Port 1 View  Result Date: 11/05/2018 CLINICAL DATA:  67 year old male with episode of shortness of breath. Subsequent encounter. EXAM: PORTABLE CHEST 1 VIEW COMPARISON:  11/02/2018 and 08/08/2017 chest x-ray. FINDINGS: Chronically elevated right hemidiaphragm. Bibasilar subsegmental atelectasis. No segmental consolidation or pulmonary edema. No plain film evidence of pulmonary malignancy. Mild cardiomegaly. Calcified slightly tortuous aorta. No pneumothorax noted. Degenerative changes shoulder joints. IMPRESSION: 1. Chronically elevated right hemidiaphragm (limits evaluation right lung base). 2. Bibasilar subsegmental atelectasis. 3. No segmental consolidation or pulmonary edema. 4. Mild cardiomegaly. 5.  Aortic Atherosclerosis (ICD10-I70.0). Electronically Signed   By: Genia Del M.D.   On: 11/05/2018 11:07    Review of Systems  Constitutional: Negative for weight loss.  HENT: Negative for ear discharge, ear pain, hearing loss and tinnitus.   Eyes: Negative for blurred vision, double vision, photophobia and pain.  Respiratory: Negative for cough, sputum production and shortness of breath.   Cardiovascular: Negative for chest pain.  Gastrointestinal: Negative for abdominal pain, nausea and vomiting.  Genitourinary: Negative for dysuria, flank pain, frequency and urgency.  Musculoskeletal:  Positive for joint pain (Bilateral ankle pain). Negative for back pain, falls, myalgias and neck pain.  Neurological: Negative for dizziness, tingling, sensory change, focal weakness, loss  of consciousness and headaches.  Endo/Heme/Allergies: Does not bruise/bleed easily.  Psychiatric/Behavioral: Negative for depression, memory loss and substance abuse. The patient is not nervous/anxious.    Blood pressure 118/78, pulse 72, temperature 98.4 F (36.9 C), temperature source Oral, resp. rate 18, height _0  (1.676 m), weight 83.9 kg, SpO2 98 %. Physical Exam  Constitutional: He appears well-developed and well-nourished. No distress.  HENT:  Head: Normocephalic and atraumatic.  Eyes: Conjunctivae are normal. Right eye exhibits no discharge. Left eye exhibits no discharge. No scleral icterus.  Neck: Normal range of motion.  Cardiovascular: Normal rate and regular rhythm.  Respiratory: Effort normal. No respiratory distress.  Musculoskeletal:  BLE No traumatic wounds, ecchymosis, or rash  Ankles diffusely and severely TTP, severe pain with AROM/PROM, mild edema R>L  No knee effusion  Knee stable to varus/ valgus and anterior/posterior stress  Sens DPN, SPN, TN intact but mildly paresthetic through the whole foot  Motor EHL, ext, flex, evers 3/5 (likely 2/2 pain)  DP 2+, PT 1+  Neurological: He is alert.  Skin: Skin is warm and dry. He is not diaphoretic.  Psychiatric: He has a normal mood and affect. His behavior is normal.    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 Will aspirate and inject both ankles to see if we can provide some symptom relief. No restrictions to movement or weightbearing.    Lisette Abu, PA-C Orthopedic Surgery [phone] 11/05/2018, 12:43 PM

## 2018-11-05 NOTE — Procedures (Signed)
Procedure: Bilateral ankle aspiration and injection  Indication: Bilateral ankle effusion(s)  Surgeon: Charma Igo, PA-C  Assist: None  Anesthesia: 0.5% Marcaine  EBL: None  Complications: None  Findings: After risks/benefits explained patient desires to undergo procedure. Consent obtained and time out performed. The bilateral ankles were sterilely prepped and aspirated. 47ml cloudy yellow fluid obtained from the right and 28ml bloody fluid obtained from the left. 40mg  Depomedrol and 78ml 0.5% Marcaine each were instilled into both joints. Pt tolerated the procedure well.    0m, PA-C Orthopedic Surgery [phone]

## 2018-11-05 NOTE — Telephone Encounter (Signed)
Caller states her husband was at the ED yesterday because of a fall. He was diagnosed with Gout. He is in unbearable pain in both feet. He states he is unable to walk or perform other daily activities.   PER [facility]

## 2018-11-05 NOTE — Telephone Encounter (Signed)
Patient is currently admitted to the hospital.

## 2018-11-05 NOTE — Progress Notes (Signed)
PROGRESS NOTE    Jerry York  ATF:[id number] DOB: [birth year] DOA: 11/03/2018 PCP: [name], MD    Brief Narrative:  67 y.o. male with medical history significant of DM2, CKD, HTN, HLD, anxiety and gout who presents with acute gout flare.  He was seen in the ED yesterday for the same.  He reports that the pain started in his ankle of his right foot and has progressed to his complete foot and with shooting pain up his legs.  He has had chills and erythema and swelling on the right side.  No open wounds or skin breakdown.  He presented to the [facility] ED yesterday and was advised to do a trial at home.  He was sent home with hydrocodone and doxycycline.  He has not been able to afford colc-hicine recently.  He did not tolerate being at home as he could not walk to the bathroom and was not able to get himself food.  There is no one at home able to help him move to the bathroom.  He is very concerned about going home.  His history of gout is long, however, he has never had a crystal diagnosis.  This is similar to previous episodes which he has been told is gout.  He has an elevated uric acid, last checked in 2018.  Nothing really seems to make the episodes improve however, he just waits it out.  The pain is now moving in to his left foot and he is unable to walk.   He has been having these episodes every 2-3 weeks.  Nothing much helps and they resolve on their own.  He is chronically on lasix.   ED Course: In the ED, he was given fluids and pain medication with limited improvement in his pain.  His wife is very concerned with his going home.   Assessment & Plan:   Active Problems:   Hyperlipidemia associated with type 2 diabetes mellitus ([place])   Hypertension associated with diabetes ([place])   Type 2 diabetes mellitus ([place])   History of BPH   Gout flare   Elevated lactic acid level   Foot pain, right   Unsteady gait  Gout flare, severe pain, inability to walk - His gout is not crystal  proven yet -Hx of hyperuricemia -R ankle without overlying erythema -Checked CRP and ESR, both markedly elevated at 42 and 123, respectively -No leukocytosis. Afebrile -Will d/c abx -Given inflammation, will benefit from IV steroids. Started IV solumedrol 62m daily -Continued on colchicine - Pain control with morphine as needed -Clinically dehydrated with poor mucus membranes. Continue on IVF hydration -Pain still uncontrolled. Have consulted Orthopedic Surgery. 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 Basal IVF continued    Hyperlipidemia associated with type 2 diabetes mellitus - Continue home lovaza and atorvastatin as tolerated    Hypertension associated with diabetes - Lasix on hold - Continue metoprolol - Remains stable    Type 2 diabetes mellitus - Continue home short acting insulin - SSI while on prednisone - Continue titrating insulin as needed    History of BPH - Appears stable at present - Low threshold for bladder scanning   DVT prophylaxis: Lovenox subq Code Status: Full Family Communication: Pt in room, family not at bedside Disposition Plan: Uncertain at this time  Consultants:  Procedures:     Antimicrobials: Anti-infectives (From admission, onward)   Start     Dose/Rate Route Frequency Ordered Stop   11/03/18 2200  doxycycline (VIBRA-TABS) tablet 100 mg  Status:  Discontinued     100 mg Oral 2 times daily 11/03/18 1754 11/04/18 0950      Subjective: Complains of continued ankle pain  Objective: Vitals:   11/04/18 2110 11/04/18 2111 11/05/18 0510 11/05/18 1325  BP: 131/78  118/78 133/74  Pulse: 86  72 69  Resp: 18   16  Temp: 98 F (36.7 C)  98.4 F (36.9 C) 97.9 F (36.6 C)  TempSrc: Oral  Oral Oral  SpO2: (!) 88% 90% 98% 92%  Weight:      Height:        Intake/Output Summary  (Last 24 hours) at 11/05/2018 1558 Last data filed at 11/05/2018 0954 Gross per 24 hour  Intake 1429.55 ml  Output 600 ml  Net 829.55 ml   Filed Weights   11/03/18 1333  Weight: 83.9 kg    Examination: General exam: Awake, laying in bed, in nad Respiratory system: Normal respiratory effort, no wheezing Cardiovascular system: regular rate, s1, s2 Gastrointestinal system: Soft, nondistended, positive BS Central nervous system: CN2-12 grossly intact, strength intact Extremities: Perfused, no clubbing, persistent R ankle pain with swelling Skin: Normal skin turgor, no notable skin lesions seen Psychiatry: Mood normal // no visual hallucinations   Data Reviewed: I have personally reviewed following labs and imaging studies  CBC: Recent Labs  Lab 11/02/18 1231 11/03/18 1407 11/03/18 1806 11/04/18 0413  WBC 10.0 9.5 9.3 7.9  NEUTROABS  --  7.7  --   --   HGB 13.8 12.3* 11.8* 11.3*  HCT 41.3 39.0 37.0* 34.0*  MCV 90.8 92.6 91.4 91.9  PLT 141* 173 161 756*   Basic Metabolic Panel: Recent Labs  Lab 11/02/18 1231 11/03/18 1407 11/03/18 1806 11/04/18 0413 11/05/18 0140  NA 138 136  --  136 136  K 3.8 3.9  --  3.9 4.0  CL 100 101  --  106 104  CO2 25 24  --  21* 26  GLUCOSE 207* 174*  --  194* 246*  BUN 33* 27*  --  31* 44*  CREATININE 1.66* 1.61* 1.61* 1.87* 1.62*  CALCIUM 8.7* 8.7*  --  8.3* 8.3*   GFR: Estimated Creatinine Clearance: 45.6 mL/min (A) (by C-G formula based on SCr of 1.62 mg/dL (H)). Liver Function Tests: Recent Labs  Lab 11/04/18 0413  AST 23  ALT 23  ALKPHOS 36*  BILITOT 1.1  PROT 6.3*  ALBUMIN 2.3*   No results for input(s): LIPASE, AMYLASE in the last 168 hours. No results for input(s): AMMONIA in the last 168 hours. Coagulation Profile: No results for input(s): INR, PROTIME in the last 168 hours. Cardiac Enzymes: No results for input(s): CKTOTAL, CKMB, CKMBINDEX, TROPONINI in the last 168 hours. BNP (last 3 results) No results for  input(s): PROBNP in the last 8760 hours. HbA1C: No results for input(s): HGBA1C in the last 72 hours. CBG: Recent Labs  Lab 11/04/18 1223 11/04/18 1702 11/04/18 2104 11/05/18 0834 11/05/18 1327  GLUCAP 165* 227* 264* 208* 267*   Lipid Profile: No results for input(s): CHOL, HDL, LDLCALC, TRIG, CHOLHDL, LDLDIRECT in the last 72 hours. Thyroid Function Tests: No results for input(s): TSH, T4TOTAL, FREET4, T3FREE, THYROIDAB in the last 72 hours. Anemia Panel: No results for input(s): VITAMINB12, FOLATE, FERRITIN, TIBC, IRON, RETICCTPCT in the last 72 hours. Sepsis  Labs: Recent Labs  Lab 11/02/18 1659 11/02/18 2008 11/03/18 1419 11/03/18 1612  LATICACIDVEN 1.39 1.47 2.41* 1.16    Recent Results (from the past 240 hour(s))  Culture, blood (routine x 2)     Status: None (Preliminary result)   Collection Time: 11/02/18  1:59 PM  Result Value Ref Range Status   Specimen Description   Final    BLOOD LEFT ANTECUBITAL Performed at [facility] [address] [place], [place] [address]    Special Requests   Final    BOTTLES DRAWN AEROBIC AND ANAEROBIC Blood Culture results may not be optimal due to an excessive volume of blood received in culture bottles Performed at [facility] [address] [place], [place] [address]    Culture   Final    NO GROWTH 3 DAYS Performed at [facility], [place] [address], [place], [place] [address]    Report Status PENDING  Incomplete  Culture, blood (routine x 2)     Status: None (Preliminary result)   Collection Time: 11/02/18  2:17 PM  Result Value Ref Range Status   Specimen Description   Final    BLOOD LEFT ANTECUBITAL Performed at [facility] [address] [place], [place] [address]    Special Requests   Final    BOTTLES DRAWN AEROBIC AND ANAEROBIC Blood Culture adequate volume Performed at [facility] [address], [place], [place] [address]      Culture   Final    NO GROWTH 3 DAYS Performed at [facility], [place] [address], [place], [place] [address]    Report Status PENDING  Incomplete  Urine culture     Status: None   Collection Time: 11/02/18  8:01 PM  Result Value Ref Range Status   Specimen Description   Final    URINE, RANDOM Performed at [facility] [address], [place], [place] [address]    Special Requests   Final    NONE Performed at [facility], [place] [address] [place],  [address]    Culture   Final    NO GROWTH Performed at [facility], [place] [address] [place],  [address]    Report Status 11/04/2018 FINAL  Final     Radiology Studies: Dg Chest Port 1 View  Result Date: 11/05/2018 CLINICAL DATA:  67 year old male with episode of shortness of breath. Subsequent encounter. EXAM: PORTABLE CHEST 1 VIEW COMPARISON:  11/02/2018 and 08/08/2017 chest x-ray. FINDINGS: Chronically elevated right hemidiaphragm. Bibasilar subsegmental atelectasis. No segmental consolidation or pulmonary edema. No plain film evidence of pulmonary malignancy. Mild cardiomegaly. Calcified slightly tortuous aorta. No pneumothorax noted. Degenerative changes shoulder joints. IMPRESSION: 1. Chronically elevated right hemidiaphragm (limits evaluation right lung base). 2. Bibasilar subsegmental atelectasis. 3. No segmental consolidation or pulmonary edema. 4. Mild cardiomegaly. 5.  Aortic Atherosclerosis (ICD10-I70.0). Electronically Signed   By: Genia Del M.D.   On: 11/05/2018 11:07    Scheduled Meds: . amLODipine  5 mg Oral Daily  . aspirin  81 mg Oral Daily  . atorvastatin  20 mg Oral q1800  . bupivacaine  10 mL Infiltration Once  . clonazePAM  0.5 mg Oral BID  . colchicine  0.6 mg Oral Daily  . enoxaparin (LOVENOX) injection  40 mg Subcutaneous Q24H  . insulin aspart  0-15 Units Subcutaneous TID WC  . insulin aspart  0-5 Units Subcutaneous QHS  . insulin aspart  6  Units Subcutaneous TID WC  . methylPREDNISolone acetate  40 mg Intra-articular Once  . methylPREDNISolone acetate  40 mg Intra-articular Once  . [START ON 11/06/2018] methylPREDNISolone (SOLU-MEDROL) injection  60 mg Intravenous Q24H  . methylPREDNISolone (SOLU-MEDROL) injection  20 mg Intravenous Once  . metoprolol tartrate  100 mg Oral BID   Continuous Infusions: . sodium chloride 75 mL/hr at 11/05/18 0600     LOS: 0 days   Marylu Lund, MD [facility] Pager On Amion  If 7PM-7AM, please contact night-coverage 11/05/2018, 3:58 PM

## 2018-11-05 NOTE — Progress Notes (Signed)
Inpatient Diabetes Program Recommendations  AACE/ADA: New Consensus Statement on Inpatient Glycemic Control (2015)  Target Ranges:  Prepandial:   less than 140 mg/dL      Peak postprandial:   less than 180 mg/dL (1-2 hours)      Critically ill patients:  140 - 180 mg/dL   Lab Results  Component Value Date   GLUCAP 208 (H) 11/05/2018   HGBA1C 6.7 (H) 08/14/2017    Review of Glycemic Control Results for Jerry York, Jerry York (MRN [id number]) as of 11/05/2018 12:11  Ref. Range 11/04/2018 17:02 11/04/2018 21:04 11/05/2018 08:34  Glucose-Capillary Latest Ref Range: 70 - 99 mg/dL 867 (H) 544 (H) 920 (H)   Diabetes history: Type 2 DM Outpatient Diabetes medications: Novolog 30 units TID Current orders for Inpatient glycemic control: Novolog 30 units TID, Novolog 0-15 units TID, Novolog 0-5 units QHS  Solumedrol 60 mg Q24H Depomedrol 40 mg X2 Solumedrol 20 mg x1  Inpatient Diabetes Program Recommendations:    Steroids initiated on 11/04/18. Assuming that blood glucose trends are going to increase.  Noted patient is on home regimen and is refusing Novolog 30 units TID. Based on current trends, it seems like an increased risk for hypoglycemia.   -Consider decreasing meal coverage to Novolog 6 units TID (assuming that patient is consuming >50% of meals, may need further adjustment once we determine baseline of needs/taper of steroids).  - Consider repeating A1C, last one noted from 2018.   Thanks, Lujean Rave, MSN, RNC-OB Diabetes Coordinator [phone] (8a-5p)

## 2018-11-05 NOTE — Evaluation (Signed)
Physical Therapy Evaluation Patient Details Name: Jerry York MRN: [id number] DOB: 06-20-1951 Today's Date: 11/05/2018   History of Present Illness  67 y.o. male with medical history significant of DM2, CKD, HTN, HLD, anxiety and gout who presents with acute gout flare.  He was seen in the ED yesterday for the same.  He reports that the pain started in his ankle of his right foot and has progressed to his complete foot and with shooting pain up his legs.  Clinical Impression   Pt admitted with above diagnosis. Pt currently with functional limitations due to the deficits listed below (see PT Problem List). Independent with waling and mobility when not experiencing a gout flare; presents to PT with decr functional mobility, decr activity tolerance, inability to bear weight bil LEs due to exquisite pain with weight bearing;  Pt will benefit from skilled PT to increase their independence and safety with mobility to allow discharge to the venue listed below.       Follow Up Recommendations Home health PT;No PT follow up;Other (comment)(Depends on medical course, may not need any PT follow up when gout flare subsides)    Equipment Recommendations  Rolling walker with 5" wheels;3in1 (PT);Other (comment)(Pt and wife are looking into getting a scooter for when gout flares occur)    Recommendations for Other Services  Dietician consult to help with modifying diet for prevention of gout flares    Precautions / Restrictions Precautions Precaution Comments: With acute gout flare, bil LEs with exquisite pain with any weight bearing Restrictions Weight Bearing Restrictions: No      Mobility  Bed Mobility Overal bed mobility: Needs Assistance Bed Mobility: Sit to Supine       Sit to supine: Supervision   General bed mobility comments: supervision fro safety  Transfers Overall transfer level: Needs assistance Equipment used: 2 person hand held assist Transfers: Squat Pivot  Transfers;Anterior-Posterior Transfer     Squat pivot transfers: +2 physical assistance;Total assist Anterior-Posterior transfers: +2 safety/equipment;Mod assist   General transfer comment: Attempted basic pivot transfer, but pt unable to shift weight over feet to pivot, even with total assist; Opted for anterior-posterior transfer t help pt back to bed while minimizing weight on his feet during the acure gout flare  Ambulation/Gait                Stairs            Wheelchair Mobility    Modified Rankin (Stroke Patients Only)       Balance                                             Pertinent Vitals/Pain Pain Assessment: 0-10 Pain Score: 10-Worst pain ever Pain Location: bil LEs with attemtps at stand/squat pivot transfers Pain Descriptors / Indicators: Sharp Pain Intervention(s): Monitored during session;Other (comment)(opted for a different transfer technique)    Home Living Family/patient expects to be discharged to:: Private residence Living Arrangements: Spouse/significant other Available Help at Discharge: Family Type of Home: House Home Access: Stairs to enter   Secretary/administrator of Steps: 3 Home Layout: One level        Prior Function Level of Independence: Independent               Hand Dominance        Extremity/Trunk Assessment   Upper Extremity Assessment Upper Extremity Assessment:  Overall WFL for tasks assessed    Lower Extremity Assessment Lower Extremity Assessment: RLE deficits/detail;LLE deficits/detail RLE Deficits / Details: Painful knee and ankle; exquisite pain with weight bearing RLE: Unable to fully assess due to pain LLE Deficits / Details: Painful knee and ankle; exquisite pain with weight bearing LLE: Unable to fully assess due to pain       Communication   Communication: No difficulties  Cognition Arousal/Alertness: Awake/alert Behavior During Therapy: WFL for tasks  assessed/performed Overall Cognitive Status: Within Functional Limits for tasks assessed                                        General Comments      Exercises     Assessment/Plan    PT Assessment Patient needs continued PT services  PT Problem List Decreased strength;Decreased range of motion;Decreased activity tolerance;Decreased balance;Decreased mobility;Decreased knowledge of use of DME;Pain       PT Treatment Interventions DME instruction;Gait training;Stair training;Functional mobility training;Therapeutic activities;Therapeutic exercise;Patient/family education    PT Goals (Current goals can be found in the Care Plan section)  Acute Rehab PT Goals Patient Stated Goal: less gout attacks PT Goal Formulation: With patient Time For Goal Achievement: 11/19/18 Potential to Achieve Goals: Good    Frequency Min 3X/week   Barriers to discharge   Pt and wife report they are considering putting a ramp in    Co-evaluation               AM-PAC PT "6 Clicks" Daily Activity  Outcome Measure Difficulty turning over in bed (including adjusting bedclothes, sheets and blankets)?: A Lot Difficulty moving from lying on back to sitting on the side of the bed? : A Lot Difficulty sitting down on and standing up from a chair with arms (e.g., wheelchair, bedside commode, etc,.)?: Unable Help needed moving to and from a bed to chair (including a wheelchair)?: A Lot Help needed walking in hospital room?: Total Help needed climbing 3-5 steps with a railing? : Total 6 Click Score: 9    End of Session Equipment Utilized During Treatment: Other (comment);Gait belt(bed pad) Activity Tolerance: Patient limited by pain Patient left: in bed;with call bell/phone within reach;with family/visitor present Nurse Communication: Mobility status PT Visit Diagnosis: Unsteadiness on feet (R26.81);Other abnormalities of gait and mobility (R26.89);Pain Pain - Right/Left:  (bilateral) Pain - part of body: Leg    Time: 4315-4008 PT Time Calculation (min) (ACUTE ONLY): 23 min   Charges:   PT Evaluation $PT Eval Moderate Complexity: 1 Mod PT Treatments $Therapeutic Activity: 8-22 mins        Jerry York, PT  Acute Rehabilitation Services Pager [phone] Office [phone]   Jerry York 11/05/2018, 5:04 PM

## 2018-11-06 ENCOUNTER — Other Ambulatory Visit: Payer: Self-pay

## 2018-11-06 LAB — BASIC METABOLIC PANEL
Anion gap: 8 (ref 5–15)
BUN: 54 mg/dL — ABNORMAL HIGH (ref 8–23)
CHLORIDE: 104 mmol/L (ref 98–111)
CO2: 21 mmol/L — AB (ref 22–32)
CREATININE: 1.61 mg/dL — AB (ref 0.61–1.24)
Calcium: 8.1 mg/dL — ABNORMAL LOW (ref 8.9–10.3)
GFR calc Af Amer: 50 mL/min — ABNORMAL LOW (ref >60)
GFR calc non Af Amer: 43 mL/min — ABNORMAL LOW (ref >60)
Glucose, Bld: 334 mg/dL — ABNORMAL HIGH (ref 70–99)
POTASSIUM: 4.1 mmol/L (ref 3.5–5.1)
Sodium: 133 mmol/L — ABNORMAL LOW (ref 135–145)

## 2018-11-06 LAB — GLUCOSE, CAPILLARY
GLUCOSE-CAPILLARY: 323 mg/dL — AB (ref 70–99)
GLUCOSE-CAPILLARY: 319 mg/dL — AB (ref 70–99)
Glucose-Capillary: 311 mg/dL — ABNORMAL HIGH (ref 70–99)
Glucose-Capillary: 300 mg/dL — ABNORMAL HIGH (ref 70–99)

## 2018-11-06 MED ORDER — INSULIN GLARGINE 100 UNIT/ML ~~LOC~~ SOLN
20.0000 [IU] | Freq: Every day | SUBCUTANEOUS | Status: DC
  Administered 2018-11-06 – 2018-11-08 (×3): 20 [IU] via SUBCUTANEOUS
  Filled 2018-11-06 (×4): qty 0.2

## 2018-11-06 MED ORDER — LACTULOSE 10 GM/15ML PO SOLN
10.0000 g | ORAL | Status: AC
  Administered 2018-11-06: 10 g via ORAL
  Filled 2018-11-06: qty 15

## 2018-11-06 MED ORDER — ALLOPURINOL 100 MG PO TABS
50.0000 mg | ORAL_TABLET | Freq: Every day | ORAL | Status: DC
  Administered 2018-11-06 – 2018-11-08 (×3): 50 mg via ORAL
  Filled 2018-11-06 (×3): qty 1

## 2018-11-06 NOTE — Progress Notes (Addendum)
Inpatient Diabetes Program Recommendations  AACE/ADA: New Consensus Statement on Inpatient Glycemic Control (2015)  Target Ranges:  Prepandial:   less than 140 mg/dL      Peak postprandial:   less than 180 mg/dL (1-2 hours)      Critically ill patients:  140 - 180 mg/dL   Lab Results  Component Value Date   GLUCAP 323 (H) 11/06/2018   HGBA1C 6.7 (H) 08/14/2017    Review of Glycemic Control Results for Jerry York, Jerry York (MRN [id number]) as of 11/06/2018 14:36  Ref. Range 11/05/2018 16:52 11/05/2018 21:34 11/06/2018 08:24 11/06/2018 12:41  Glucose-Capillary Latest Ref Range: 70 - 99 mg/dL 384 (H) 536 (H) 468 (H) 323 (H)   Diabetes history: Type 2 DM Outpatient Diabetes medications: Novolog 30 units TID Current orders for Inpatient glycemic control: Novolog 6 units TID, Novolog 0-15 units TID, Novolog 0-5 units QHS  Solumedrol 60 mg Q24H Depomedrol 40 mg X2 Solumedrol 20 mg x1  Inpatient Diabetes Program Recommendations:    Steroids initiated on 11/04/18, thus explaining increased blood glucose trends.  - Consider repeating A1C, last one noted from 2018.  - Additionally, consider adding basal insulin: Lantus 20 units QHS.   Thanks, Lujean Rave, MSN, RNC-OB Diabetes Coordinator [phone] (8a-5p)

## 2018-11-06 NOTE — Patient Outreach (Signed)
[facility]) Care Management  11/06/2018  Jerry York [birth year] [id number]   Care Coordination   Telephone Screen  Referral Date: 1111/19 Referral Source: [employer] Concierge  Referral Reason: " member has chronic gout issue and is non-weight bearing, not getting adequate care from PCP and in need of a doctor to make a home visit" Insurance: HTA   Referral received from patient's Medicare plan-HTA. Upon chart review, patient noted to be currently inpatient.   Plan: RN CM will notify [facility] hospital liaison of inpatient status.    [name], RN,BSN,CCM [facility] Care Management Telephonic Care Management Coordinator Direct Phone: [phone] Toll Free: [phone] Fax: [phone]

## 2018-11-06 NOTE — Care Management Note (Signed)
Case Management Note  Patient Details  Name: Jerry York MRN: [id number] Date of Birth: 1951-01-30  Subjective/Objective:                    Action/Plan: Confirmed face sheet information with patient at bedside.   Patient has walker at home.   Ordered 3 in1 through [place] with [facility].   Home health PT referral given to [facility] with [name] per patient request.  Expected Discharge Date:                  Expected Discharge Plan:  Home w Home Health Services  In-House Referral:     Discharge planning Services  CM Consult  Post Acute Care Choice:  Durable Medical Equipment, Home Health Choice offered to:  Patient  DME Arranged:  3-N-1 DME Agency:  [facility]  HH Arranged:  PT [facility] Agency:  [facility]  Status of Service:  Completed, signed off  If discussed at Long Length of Stay Meetings, dates discussed:    Additional Comments:  Kingsley Plan, RN 11/06/2018, 8:50 AM

## 2018-11-06 NOTE — Progress Notes (Signed)
Patient ID: Jerry York, male   DOB: 02-26-1951, 67 y.o.   MRN: [id number]   LOS: 1 day   Subjective: Feels about the same   Objective: Vital signs in last 24 hours: Temp:  [97.5 F (36.4 C)-98 F (36.7 C)] 97.7 F (36.5 C) (11/12 0838) Pulse Rate:  [58-77] 58 (11/12 0838) Resp:  [16-18] 18 (11/12 0521) BP: (119-134)/(74-82) 134/82 (11/12 0838) SpO2:  [90 %-92 %] 90 % (11/12 0838)     Laboratory  CBC Recent Labs    11/03/18 1806 11/04/18 0413  WBC 9.3 7.9  HGB 11.8* 11.3*  HCT 37.0* 34.0*  PLT 161 143*   BMET Recent Labs    11/05/18 0140 11/06/18 0143  NA 136 133*  K 4.0 4.1  CL 104 104  CO2 26 21*  GLUCOSE 246* 334*  BUN 44* 54*  CREATININE 1.62* 1.61*  CALCIUM 8.3* 8.1*     Physical Exam General appearance: alert and no distress   Assessment/Plan: Gout -- Given the frequency and severity of pt's gout flares I think referral to rheumatology is warranted. In the short term would suggest resuming allopurinol and titrating as able. Would consider discontinuing diuretic. Should get dietary counseling for weight loss and dietary changes. If he is a candidate for an ARB Losartan specifically may be additionally beneficial.    Freeman Caldron, PA-C Orthopedic Surgery [phone] 11/06/2018

## 2018-11-06 NOTE — Progress Notes (Signed)
PROGRESS NOTE    Jerry York  KDX:[id number] DOB: 12/09/1951 DOA: 11/03/2018 PCP: [name], MD    Brief Narrative:  67 y.o. male with medical history significant of DM2, CKD, HTN, HLD, anxiety and gout who presents with acute gout flare.  He was seen in the ED yesterday for the same.  He reports that the pain started in his ankle of his right foot and has progressed to his complete foot and with shooting pain up his legs.  He has had chills and erythema and swelling on the right side.  No open wounds or skin breakdown.  He presented to the [facility] ED yesterday and was advised to do a trial at home.  He was sent home with hydrocodone and doxycycline.  He has not been able to afford colc-hicine recently.  He did not tolerate being at home as he could not walk to the bathroom and was not able to get himself food.  There is no one at home able to help him move to the bathroom.  He is very concerned about going home.  His history of gout is long, however, he has never had a crystal diagnosis.  This is similar to previous episodes which he has been told is gout.  He has an elevated uric acid, last checked in 2018.  Nothing really seems to make the episodes improve however, he just waits it out.  The pain is now moving in to his left foot and he is unable to walk.   He has been having these episodes every 2-3 weeks.  Nothing much helps and they resolve on their own.  He is chronically on lasix.   ED Course: In the ED, he was given fluids and pain medication with limited improvement in his pain.  His wife is very concerned with his going home.   Assessment & Plan:   Active Problems:   Hyperlipidemia associated with type 2 diabetes mellitus ([place])   Hypertension associated with diabetes ([place])   Type 2 diabetes mellitus ([place])   History of BPH   Gout flare   Elevated lactic acid level   Foot pain, right   Unsteady gait  Gout flare, severe pain, inability to walk -Hx of hyperuricemia -R  ankle without overlying erythema -Checked CRP and ESR, both markedly elevated at 42 and 123, respectively -No leukocytosis. Afebrile -Have d/c abx -Continued on colchicine - Pain control with morphine as needed -Volume status improved, however still dehydrated with poor mucus membranes. -Will continue on IVF hydration -Appreciate input by Ortho. 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 Basal IVF continued -Question need for lasix moving forward    Hyperlipidemia associated with type 2 diabetes mellitus - Continue home lovaza and atorvastatin as tolerated    Hypertension associated with diabetes - Lasix on hold - Continue metoprolol - BP stable and controlled at this time    Type 2 diabetes mellitus - Continue home short acting insulin - Have added 20 units lantus given concurrent IV steroids - Cont SSI coverage - Continue titrating insulin as needed    History of BPH - Appears stable at present with good urine output (over 2.5L thus far today) - Low threshold for bladder scanning   DVT  prophylaxis: Lovenox subq Code Status: Full Family Communication: Pt in room, family at bedside Disposition Plan: Uncertain at this time  Consultants:   Orthopedic Surgery  Procedures:   R ankle injection and diagnostic joint aspiration 11/11  Antimicrobials: Anti-infectives (From admission, onward)   Start     Dose/Rate Route Frequency Ordered Stop   11/03/18 2200  doxycycline (VIBRA-TABS) tablet 100 mg  Status:  Discontinued     100 mg Oral 2 times daily 11/03/18 1754 11/04/18 0950      Subjective: Still with marked ankle pain, somewhat improved  Objective: Vitals:   11/05/18 2134 11/06/18 0521 11/06/18 0838 11/06/18 1354  BP:  119/74 123/76 134/82 116/82  Pulse: 77 60 (!) 58 (!) 59  Resp: 17 18    Temp: 98 F (36.7 C) (!) 97.5 F (36.4 C) 97.7 F (36.5 C) 98 F (36.7 C)  TempSrc: Oral Oral Oral Oral  SpO2: 91% 91% 90% 91%  Weight:      Height:        Intake/Output Summary (Last 24 hours) at 11/06/2018 1556 Last data filed at 11/06/2018 1502 Gross per 24 hour  Intake 3906.97 ml  Output 3995 ml  Net -88.03 ml   Filed Weights   11/03/18 1333  Weight: 83.9 kg    Examination: General exam: Conversant, in no acute distress Respiratory system: normal chest rise, clear, no audible wheezing Cardiovascular system: regular rhythm, s1-s2 Gastrointestinal system: Nondistended, nontender, pos BS Central nervous system: No seizures, no tremors Extremities: No cyanosis, no joint deformities, R ankle still edematous and warm Skin: No rashes, no pallor Psychiatry: Affect normal // no auditory hallucinations   Data Reviewed: I have personally reviewed following labs and imaging studies  CBC: Recent Labs  Lab 11/02/18 1231 11/03/18 1407 11/03/18 1806 11/04/18 0413  WBC 10.0 9.5 9.3 7.9  NEUTROABS  --  7.7  --   --   HGB 13.8 12.3* 11.8* 11.3*  HCT 41.3 39.0 37.0* 34.0*  MCV 90.8 92.6 91.4 91.9  PLT 141* 173 161 254*   Basic Metabolic Panel: Recent Labs  Lab 11/02/18 1231 11/03/18 1407 11/03/18 1806 11/04/18 0413 11/05/18 0140 11/06/18 0143  NA 138 136  --  136 136 133*  K 3.8 3.9  --  3.9 4.0 4.1  CL 100 101  --  106 104 104  CO2 25 24  --  21* 26 21*  GLUCOSE 207* 174*  --  194* 246* 334*  BUN 33* 27*  --  31* 44* 54*  CREATININE 1.66* 1.61* 1.61* 1.87* 1.62* 1.61*  CALCIUM 8.7* 8.7*  --  8.3* 8.3* 8.1*   GFR: Estimated Creatinine Clearance: 45.8 mL/min (A) (by C-G formula based on SCr of 1.61 mg/dL (H)). Liver Function Tests: Recent Labs  Lab 11/04/18 0413  AST 23  ALT 23  ALKPHOS 36*  BILITOT 1.1  PROT 6.3*  ALBUMIN 2.3*   No results for input(s): LIPASE, AMYLASE in the  last 168 hours. No results for input(s): AMMONIA in the last 168 hours. Coagulation Profile: No results for input(s): INR, PROTIME in the last 168 hours. Cardiac Enzymes: No results for input(s): CKTOTAL, CKMB, CKMBINDEX, TROPONINI in the last 168 hours. BNP (last 3 results) No results for input(s): PROBNP in the last 8760 hours. HbA1C: No results for input(s): HGBA1C in the last 72 hours. CBG: Recent Labs  Lab 11/05/18 1327 11/05/18 1652 11/05/18 2134 11/06/18 0824 11/06/18 1241  GLUCAP 267* 300* 357* 311* 323*   Lipid Profile: No results  for input(s): CHOL, HDL, LDLCALC, TRIG, CHOLHDL, LDLDIRECT in the last 72 hours. Thyroid Function Tests: No results for input(s): TSH, T4TOTAL, FREET4, T3FREE, THYROIDAB in the last 72 hours. Anemia Panel: No results for input(s): VITAMINB12, FOLATE, FERRITIN, TIBC, IRON, RETICCTPCT in the last 72 hours. Sepsis Labs: Recent Labs  Lab 11/02/18 1659 11/02/18 2008 11/03/18 1419 11/03/18 1612  LATICACIDVEN 1.39 1.47 2.41* 1.16    Recent Results (from the past 240 hour(s))  Culture, blood (routine x 2)     Status: None (Preliminary result)   Collection Time: 11/02/18  1:59 PM  Result Value Ref Range Status   Specimen Description   Final    BLOOD LEFT ANTECUBITAL Performed at [facility] [address] [place], [place] [address]    Special Requests   Final    BOTTLES DRAWN AEROBIC AND ANAEROBIC Blood Culture results may not be optimal due to an excessive volume of blood received in culture bottles Performed at [facility] [address], [place], [place] [address]    Culture   Final    NO GROWTH 4 DAYS Performed at [facility], [place] [address], [place], [place] [address]    Report Status PENDING  Incomplete  Culture, blood (routine x 2)     Status: None (Preliminary result)   Collection Time: 11/02/18  2:17 PM  Result Value Ref Range Status   Specimen Description   Final    BLOOD  LEFT ANTECUBITAL Performed at [facility] [address], [place], [place] [address]    Special Requests   Final    BOTTLES DRAWN AEROBIC AND ANAEROBIC Blood Culture adequate volume Performed at [facility] [address], [place], [place] [address]    Culture   Final    NO GROWTH 4 DAYS Performed at [facility], [place] [address], [place], [place] [address]    Report Status PENDING  Incomplete  Urine culture     Status: None   Collection Time: 11/02/18  8:01 PM  Result Value Ref Range Status   Specimen Description   Final    URINE, RANDOM Performed at [facility] [address] [place], [place] [address]    Special Requests   Final    NONE Performed at [facility], [place] [address] [place], [place] [address]    Culture   Final    NO GROWTH Performed at [facility], [place] [address], [place],  [address]    Report Status 11/04/2018 FINAL  Final     Radiology Studies: Dg Chest Port 1 View  Result Date: 11/05/2018 CLINICAL DATA:  67 year old male with episode of shortness of breath. Subsequent encounter. EXAM: PORTABLE CHEST 1 VIEW COMPARISON:  11/02/2018 and 08/08/2017 chest x-ray. FINDINGS: Chronically elevated right hemidiaphragm. Bibasilar subsegmental atelectasis. No segmental consolidation or pulmonary edema. No plain film evidence of pulmonary malignancy. Mild cardiomegaly. Calcified slightly tortuous aorta. No pneumothorax noted. Degenerative changes shoulder joints. IMPRESSION: 1. Chronically elevated right hemidiaphragm (limits evaluation right lung base). 2. Bibasilar subsegmental atelectasis. 3. No segmental consolidation or pulmonary edema. 4. Mild cardiomegaly. 5.  Aortic Atherosclerosis (ICD10-I70.0). Electronically Signed   By: Genia Del M.D.   On: 11/05/2018 11:07    Scheduled Meds: . allopurinol  50 mg Oral Daily  . amLODipine  5 mg Oral Daily  . aspirin  81 mg  Oral Daily  . atorvastatin  20 mg Oral q1800  .  bupivacaine  10 mL Infiltration Once  . clonazePAM  0.5 mg Oral BID  . colchicine  0.6 mg Oral Daily  . enoxaparin (LOVENOX) injection  40 mg Subcutaneous Q24H  . insulin aspart  0-15 Units Subcutaneous TID WC  . insulin aspart  0-5 Units Subcutaneous QHS  . insulin aspart  6 Units Subcutaneous TID WC  . insulin glargine  20 Units Subcutaneous Daily  . methylPREDNISolone acetate  40 mg Intra-articular Once  . methylPREDNISolone acetate  40 mg Intra-articular Once  . methylPREDNISolone (SOLU-MEDROL) injection  60 mg Intravenous Q24H  . metoprolol tartrate  100 mg Oral BID   Continuous Infusions: . sodium chloride 100 mL/hr at 11/06/18 1349     LOS: 1 day   Marylu Lund, MD [facility] Pager On Amion  If 7PM-7AM, please contact night-coverage 11/06/2018, 3:56 PM

## 2018-11-07 DIAGNOSIS — M10011 Idiopathic gout, right shoulder: Secondary | ICD-10-CM

## 2018-11-07 LAB — GLUCOSE, CAPILLARY
GLUCOSE-CAPILLARY: 336 mg/dL — AB (ref 70–99)
GLUCOSE-CAPILLARY: 371 mg/dL — AB (ref 70–99)
Glucose-Capillary: 329 mg/dL — ABNORMAL HIGH (ref 70–99)
Glucose-Capillary: 316 mg/dL — ABNORMAL HIGH (ref 70–99)

## 2018-11-07 LAB — BASIC METABOLIC PANEL
Anion gap: 7 (ref 5–15)
BUN: 53 mg/dL — ABNORMAL HIGH (ref 8–23)
CALCIUM: 8 mg/dL — AB (ref 8.9–10.3)
CO2: 19 mmol/L — AB (ref 22–32)
CREATININE: 1.39 mg/dL — AB (ref 0.61–1.24)
Chloride: 108 mmol/L (ref 98–111)
GFR calc Af Amer: 59 mL/min — ABNORMAL LOW (ref >60)
GFR calc non Af Amer: 51 mL/min — ABNORMAL LOW (ref >60)
GLUCOSE: 293 mg/dL — AB (ref 70–99)
Potassium: 4.4 mmol/L (ref 3.5–5.1)
Sodium: 134 mmol/L — ABNORMAL LOW (ref 135–145)

## 2018-11-07 LAB — CULTURE, BLOOD (ROUTINE X 2)
CULTURE: NO GROWTH
Culture: NO GROWTH
Special Requests: ADEQUATE

## 2018-11-07 LAB — SEDIMENTATION RATE: Sed Rate: 133 mm/hr — ABNORMAL HIGH (ref 0–16)

## 2018-11-07 NOTE — Progress Notes (Signed)
Physical Therapy Treatment Patient Details Name: Jerry York MRN: [id number] DOB: [birth year] Today's Date: 11/07/2018    History of Present Illness 67 y.o. male with medical history significant of DM2, CKD, HTN, HLD, anxiety and gout who presents with acute gout flare.  He was seen in the ED yesterday for the same.  He reports that the pain started in his ankle of his right foot and has progressed to his complete foot and with shooting pain up his legs.    PT Comments    Continuing work on functional mobility and activity tolerance;  Significant pain bilateral LEs, knees, ankles limiting use of LEs for transfers; Worked on lateral scooting for more options for OOB to chair transfers; Currently requires +2 assist for lateral scooting, difficulty with weight shifts, and noted UE weakness with pushing up; Will try with sliding board next session;   Still hoping for gout pain to subside with medical treatment -- look forward to initiating gait training when Jerry York can tolerate  Follow Up Recommendations  SNF     Equipment Recommendations  Rolling walker with 5" wheels;Wheelchair (measurements PT);Wheelchair cushion (measurements PT);Other (comment)(drop-arm BSC, sliding board)    Recommendations for Other Services       Precautions / Restrictions Precautions Precaution Comments: With acute gout flare, bil LEs with exquisite pain with any weight bearing    Mobility  Bed Mobility Overal bed mobility: Needs Assistance Bed Mobility: Rolling;Sidelying to Sit Rolling: Min guard Sidelying to sit: Mod assist       General bed mobility comments: Opted for rolling and sidelying to sit technique, as he had a lot of difficulty pulling to sit; Light mod assist to pull to sit  Transfers Overall transfer level: Needs assistance Equipment used: (bed pad) Transfers: Lateral/Scoot Transfers          Lateral/Scoot Transfers: +2 physical assistance;Max assist General transfer comment:  Difficulty weight shifting, especially anterior weight shifting to unweigh hips for scooting, as he is unable to tolerate any pressure through his feet; Required +2 assist and use of bed pad to lateral scoot/slide OOB to recliner (armrest down)  Ambulation/Gait                 Stairs             Wheelchair Mobility    Modified Rankin (Stroke Patients Only)       Balance                                            Cognition Arousal/Alertness: Awake/alert Behavior During Therapy: WFL for tasks assessed/performed Overall Cognitive Status: Within Functional Limits for tasks assessed                                        Exercises      General Comments        Pertinent Vitals/Pain Pain Assessment: 0-10 Pain Score: 10-Worst pain ever Pain Location: bil LEs with light touch or weight bearing Pain Descriptors / Indicators: Sharp Pain Intervention(s): Premedicated before session;Monitored during session    Home Living                      Prior Function            PT Goals (  current goals can now be found in the care plan section) Acute Rehab PT Goals Patient Stated Goal: less gout attacks PT Goal Formulation: With patient Time For Goal Achievement: 11/19/18 Potential to Achieve Goals: Good Progress towards PT goals: Not progressing toward goals - comment(limited by pain)    Frequency    Min 3X/week      PT Plan Discharge plan needs to be updated    Co-evaluation              AM-PAC PT "6 Clicks" Daily Activity  Outcome Measure  Difficulty turning over in bed (including adjusting bedclothes, sheets and blankets)?: A Lot Difficulty moving from lying on back to sitting on the side of the bed? : Unable Difficulty sitting down on and standing up from a chair with arms (e.g., wheelchair, bedside commode, etc,.)?: Unable Help needed moving to and from a bed to chair (including a wheelchair)?: A  Lot Help needed walking in hospital room?: Total Help needed climbing 3-5 steps with a railing? : Total 6 Click Score: 8    End of Session Equipment Utilized During Treatment: Other (comment)(bed pad) Activity Tolerance: Patient limited by pain Patient left: in chair;with call bell/phone within reach Nurse Communication: Mobility status PT Visit Diagnosis: Unsteadiness on feet (R26.81);Other abnormalities of gait and mobility (R26.89);Pain Pain - Right/Left: (bilateral) Pain - part of body: Leg     Time: 9147-8295 PT Time Calculation (min) (ACUTE ONLY): 26 min  Charges:  $Therapeutic Activity: 23-37 mins                     Van Clines, PT  Acute Rehabilitation Services Pager [phone] Office [phone]    Jerry York 11/07/2018, 4:20 PM

## 2018-11-07 NOTE — Progress Notes (Signed)
Inpatient Diabetes Program Recommendations  AACE/ADA: New Consensus Statement on Inpatient Glycemic Control (2015)  Target Ranges:  Prepandial:   less than 140 mg/dL      Peak postprandial:   less than 180 mg/dL (1-2 hours)      Critically ill patients:  140 - 180 mg/dL   Lab Results  Component Value Date   GLUCAP 329 (H) 11/07/2018   HGBA1C 6.7 (H) 08/14/2017    Review of Glycemic Control Results for ANDONI, BUSCH (MRN [id number]) as of 11/07/2018 11:41  Ref. Range 11/06/2018 17:08 11/06/2018 21:14 11/07/2018 08:02  Glucose-Capillary Latest Ref Range: 70 - 99 mg/dL 976 (H) 734 (H) 193 (H)   Diabetes history:Type 2 DM Outpatient Diabetes medications:Novolog 30 units TID Current orders for Inpatient glycemic control:Novolog 6 units TID, Novolog 0-15 units TID, Novolog 0-5 units QHS, Lantus 20 units QHS  Solumedrol 60 mg Q24H Depomedrol 40 mg X2 Solumedrol 20 mg x1  Inpatient Diabetes Program Recommendations:  Steroids initiated on 11/04/18, thus explaining increased blood glucose trends.  Consider increasing Lantus to 30 units QD and increasing Novolog 10 units TID (assuming patient is consuming >50% of meal). Once steroids are taperedmay need further adjustment.  Thanks, Lujean Rave, MSN, RNC-OB Diabetes Coordinator [phone] (8a-5p)

## 2018-11-07 NOTE — Plan of Care (Signed)
Nutrition Education Note  RD consulted for nutrition education regarding gout.  RD provided "Low Purine Nutrition Therapy" handout from the Academy of [employer]. Reviewed patient's dietary recall. Provided examples on ways to decrease purine intake in diet. Reviewed sources of low, moderate, and high purine foods sources and how to modify diet to avoid further gout flares.    RD discussed why it is important for patient to adhere to diet recommendations. Teach back method used.  Expect very good compliance.  Body mass index is 29.86 kg/m. Pt meets criteria for overweight based on current BMI.  Current diet order is Carb Modified, patient is consuming approximately 100% of meals at this time. Labs and medications reviewed. No further nutrition interventions warranted at this time. RD contact information provided. If additional nutrition issues arise, please re-consult RD.   Jerry York, RD, LDN, CDE Pager: [phone] After hours Pager: [phone]

## 2018-11-07 NOTE — Progress Notes (Signed)
PROGRESS NOTE    Jerry York  UVO:[id number] DOB: Feb 20, 1951 DOA: 11/03/2018 PCP: Ardith Dark, MD   Brief Narrative:  67 year old with past medical history relevant for hypertension, hyperlipidemia, type 2 diabetes on insulin, stage II CKD who was admitted with severe foot pain found to have bilateral gout and ankles.   Assessment & Plan:   Active Problems:   Hyperlipidemia associated with type 2 diabetes mellitus (HCC)   Hypertension associated with diabetes (HCC)   Type 2 diabetes mellitus (HCC)   History of BPH   Gout flare   Elevated lactic acid level   Foot pain, right   Unsteady gait   #) Acute bilateral gout: Due to severity of gout patient cannot ambulate because of the swelling in his legs and the requirement for IV pain medications - Status post bilateral arthrocentesis and steroid injection on 11/05/2018 -Continue allopurinol 50 mg daily -Continue IV methylprednisolone 60 mg daily - Continue colchicine 0.6 mg daily -Physical therapy consult  #)hypertension/hyperlipidemia: -Continue amlodipine 5 mg daily - Continue atorvastatin 20 mg nightly - Continue metoprolol tartrate 100 mg twice daily  #) Type 2 diabetes on insulin: -Hold 30 units of regular with meals -Continue sliding scale insulin, SHS - Continue glargine 20 units nightly -Continue aspart 6 units with meals  #) Stage II CKD: This appears to be stable  Fluids: Tolerating p.o. Electrodes: Monitor and supplement Nutrition: Carb restricted diet  Prophylaxis: Enoxaparin  Disposition: Pending physical therapy evaluation  Full code     Consultants:   Orthopedic surgery  Procedures:   11/05/2018 bilateral ankle arthrocentesis and steroid injection  Antimicrobials:   None   Subjective: This morning patient reports feeling about the same.  He denies any nausea, vomiting, diarrhea, cough, congestion, rhinorrhea.  He reports he has not been able to ambulate.  Objective: Vitals:     11/06/18 1354 11/06/18 2027 11/07/18 0623 11/07/18 0957  BP: 116/82 120/73 126/68 126/78  Pulse: (!) 59 (!) 50 (!) 48 (!) 57  Resp:  (!) 22 14   Temp: 98 F (36.7 C) 97.7 F (36.5 C) (!) 97.5 F (36.4 C)   TempSrc: Oral Oral Oral   SpO2: 91% 93% 92% 93%  Weight:      Height:        Intake/Output Summary (Last 24 hours) at 11/07/2018 1324 Last data filed at 11/07/2018 1303 Gross per 24 hour  Intake 3454.13 ml  Output 2720 ml  Net 734.13 ml   Filed Weights   11/03/18 1333  Weight: 83.9 kg    Examination:  General exam: Appears calm and comfortable  Respiratory system: Clear to auscultation. Respiratory effort normal. Cardiovascular system: Regular rate and rhythm, no murmurs. Gastrointestinal system: Abdomen is nondistended, soft and nontender. No organomegaly or masses felt. Normal bowel sounds heard. Central nervous system: Alert and oriented.  Grossly intact, moving all extremities Extremities: Bilateral ankle swollen, pain with eversion, plantarflexion and dorsiflexion, inversion, intact distal pulses. Skin: No rashes over visible skin Psychiatry: Judgement and insight appear normal. Mood & affect appropriate.     Data Reviewed: I have personally reviewed following labs and imaging studies  CBC: Recent Labs  Lab 11/02/18 1231 11/03/18 1407 11/03/18 1806 11/04/18 0413  WBC 10.0 9.5 9.3 7.9  NEUTROABS  --  7.7  --   --   HGB 13.8 12.3* 11.8* 11.3*  HCT 41.3 39.0 37.0* 34.0*  MCV 90.8 92.6 91.4 91.9  PLT 141* 173 161 143*   Basic Metabolic Panel: Recent Labs  Lab 11/03/18 1407 11/03/18 1806 11/04/18 0413 11/05/18 0140 11/06/18 0143 11/07/18 0130  NA 136  --  136 136 133* 134*  K 3.9  --  3.9 4.0 4.1 4.4  CL 101  --  106 104 104 108  CO2 24  --  21* 26 21* 19*  GLUCOSE 174*  --  194* 246* 334* 293*  BUN 27*  --  31* 44* 54* 53*  CREATININE 1.61* 1.61* 1.87* 1.62* 1.61* 1.39*  CALCIUM 8.7*  --  8.3* 8.3* 8.1* 8.0*   GFR: Estimated Creatinine  Clearance: 53.1 mL/min (A) (by C-G formula based on SCr of 1.39 mg/dL (H)). Liver Function Tests: Recent Labs  Lab 11/04/18 0413  AST 23  ALT 23  ALKPHOS 36*  BILITOT 1.1  PROT 6.3*  ALBUMIN 2.3*   No results for input(s): LIPASE, AMYLASE in the last 168 hours. No results for input(s): AMMONIA in the last 168 hours. Coagulation Profile: No results for input(s): INR, PROTIME in the last 168 hours. Cardiac Enzymes: No results for input(s): CKTOTAL, CKMB, CKMBINDEX, TROPONINI in the last 168 hours. BNP (last 3 results) No results for input(s): PROBNP in the last 8760 hours. HbA1C: No results for input(s): HGBA1C in the last 72 hours. CBG: Recent Labs  Lab 11/06/18 1241 11/06/18 1708 11/06/18 2114 11/07/18 0802 11/07/18 1257  GLUCAP 323* 300* 319* 329* 336*   Lipid Profile: No results for input(s): CHOL, HDL, LDLCALC, TRIG, CHOLHDL, LDLDIRECT in the last 72 hours. Thyroid Function Tests: No results for input(s): TSH, T4TOTAL, FREET4, T3FREE, THYROIDAB in the last 72 hours. Anemia Panel: No results for input(s): VITAMINB12, FOLATE, FERRITIN, TIBC, IRON, RETICCTPCT in the last 72 hours. Sepsis Labs: Recent Labs  Lab 11/02/18 1659 11/02/18 2008 11/03/18 1419 11/03/18 1612  LATICACIDVEN 1.39 1.47 2.41* 1.16    Recent Results (from the past 240 hour(s))  Culture, blood (routine x 2)     Status: None   Collection Time: 11/02/18  1:59 PM  Result Value Ref Range Status   Specimen Description   Final    BLOOD LEFT ANTECUBITAL Performed at [facility], [address] [place], [place] [address]    Special Requests   Final    BOTTLES DRAWN AEROBIC AND ANAEROBIC Blood Culture results may not be optimal due to an excessive volume of blood received in culture bottles Performed at [facility], [address], [place], [place] [address]    Culture   Final    NO GROWTH 5 DAYS Performed at [facility], [address]  [place], [place] [address]    Report Status 11/07/2018 FINAL  Final  Culture, blood (routine x 2)     Status: None   Collection Time: 11/02/18  2:17 PM  Result Value Ref Range Status   Specimen Description   Final    BLOOD LEFT ANTECUBITAL Performed at [facility], [address], [place], [place] [address]    Special Requests   Final    BOTTLES DRAWN AEROBIC AND ANAEROBIC Blood Culture adequate volume Performed at [facility], [address], [place], [place] [address]    Culture   Final    NO GROWTH 5 DAYS Performed at [facility], [address] [place], [place] [address]    Report Status 11/07/2018 FINAL  Final  Urine culture     Status: None   Collection Time: 11/02/18  8:01 PM  Result Value Ref Range Status  Specimen Description   Final    URINE, RANDOM Performed at [facility], [address], [place], [place] [address]    Special Requests   Final    NONE Performed at [facility], [address], [place], [place] [address]    Culture   Final    NO GROWTH Performed at [facility], [address] [place], [place] [address]    Report Status 11/04/2018 FINAL  Final         Radiology Studies: No results found.      Scheduled Meds: . allopurinol  50 mg Oral Daily  . amLODipine  5 mg Oral Daily  . aspirin  81 mg Oral Daily  . atorvastatin  20 mg Oral q1800  . bupivacaine  10 mL Infiltration Once  . clonazePAM  0.5 mg Oral BID  . colchicine  0.6 mg Oral Daily  . enoxaparin (LOVENOX) injection  40 mg Subcutaneous Q24H  . insulin aspart  0-15 Units Subcutaneous TID WC  . insulin aspart  0-5 Units Subcutaneous QHS  . insulin aspart  6 Units Subcutaneous TID WC  . insulin glargine  20 Units Subcutaneous Daily  . methylPREDNISolone acetate  40 mg Intra-articular Once  . methylPREDNISolone acetate  40 mg Intra-articular Once  . methylPREDNISolone (SOLU-MEDROL) injection   60 mg Intravenous Q24H  . metoprolol tartrate  100 mg Oral BID   Continuous Infusions: . sodium chloride 100 mL/hr at 11/07/18 1156     LOS: 2 days    Time spent: 35    Delaine Lame, MD [facility]  If 7PM-7AM, please contact night-coverage [email or web address] Password [email or web address] 11/07/2018, 1:24 PM

## 2018-11-07 NOTE — Progress Notes (Signed)
Physical Therapy Note  Patient suffers from acute on chronic gout flares bil LEs which impairs their ability to perform daily activities like walking, bearing weight on legs, and general mobility in the home.  A walker alone will not resolve the issues with performing activities of daily living. A lightweight wheelchair will allow patient to safely perform daily activities.  The patient can self propel in the home or has a caregiver who can provide assistance.     Van Clines, [place]  Acute Rehabilitation Services Pager [phone] Office [phone]

## 2018-11-08 LAB — CBC
HCT: 34.2 % — ABNORMAL LOW (ref 39.0–52.0)
Hemoglobin: 11.2 g/dL — ABNORMAL LOW (ref 13.0–17.0)
MCH: 29.9 pg (ref 26.0–34.0)
MCHC: 32.7 g/dL (ref 30.0–36.0)
MCV: 91.4 fL (ref 80.0–100.0)
Platelets: 250 10*3/uL (ref 150–400)
RBC: 3.74 MIL/uL — ABNORMAL LOW (ref 4.22–5.81)
RDW: 14.9 % (ref 11.5–15.5)
WBC: 8.8 10*3/uL (ref 4.0–10.5)
nRBC: 0.9 % — ABNORMAL HIGH (ref 0.0–0.2)

## 2018-11-08 LAB — GLUCOSE, CAPILLARY
GLUCOSE-CAPILLARY: 254 mg/dL — AB (ref 70–99)
Glucose-Capillary: 269 mg/dL — ABNORMAL HIGH (ref 70–99)
Glucose-Capillary: 301 mg/dL — ABNORMAL HIGH (ref 70–99)
Glucose-Capillary: 318 mg/dL — ABNORMAL HIGH (ref 70–99)

## 2018-11-08 LAB — BASIC METABOLIC PANEL
Anion gap: 9 (ref 5–15)
CO2: 18 mmol/L — ABNORMAL LOW (ref 22–32)
Calcium: 8 mg/dL — ABNORMAL LOW (ref 8.9–10.3)
GFR calc Af Amer: >60 mL/min (ref >60)
GFR calc non Af Amer: 52 mL/min — ABNORMAL LOW (ref >60)
Glucose, Bld: 314 mg/dL — ABNORMAL HIGH (ref 70–99)
Potassium: 4.3 mmol/L (ref 3.5–5.1)
Sodium: 133 mmol/L — ABNORMAL LOW (ref 135–145)

## 2018-11-08 LAB — BASIC METABOLIC PANEL WITH GFR
BUN: 53 mg/dL — ABNORMAL HIGH (ref 8–23)
Chloride: 106 mmol/L (ref 98–111)
Creatinine, Ser: 1.37 mg/dL — ABNORMAL HIGH (ref 0.61–1.24)

## 2018-11-08 LAB — MAGNESIUM: Magnesium: 2.4 mg/dL (ref 1.7–2.4)

## 2018-11-08 MED ORDER — COLCHICINE 0.6 MG PO TABS
1.2000 mg | ORAL_TABLET | ORAL | Status: AC
  Administered 2018-11-08: 1.2 mg via ORAL
  Filled 2018-11-08: qty 2

## 2018-11-08 MED ORDER — ALLOPURINOL 100 MG PO TABS
50.0000 mg | ORAL_TABLET | ORAL | Status: AC
  Administered 2018-11-08: 50 mg via ORAL
  Filled 2018-11-08: qty 1

## 2018-11-08 MED ORDER — ALLOPURINOL 100 MG PO TABS
100.0000 mg | ORAL_TABLET | Freq: Every day | ORAL | Status: DC
  Administered 2018-11-09 – 2018-11-10 (×2): 100 mg via ORAL
  Filled 2018-11-08 (×2): qty 1

## 2018-11-08 NOTE — Progress Notes (Signed)
PROGRESS NOTE    Jerry York  WER:[id number] DOB: [birth year] DOA: 11/03/2018 PCP: Ardith Dark, MD   Brief Narrative:  66 year old with past medical history relevant for hypertension, hyperlipidemia, type 2 diabetes on insulin, stage II CKD who was admitted with severe foot pain found to have bilateral gout and ankles.   Assessment & Plan:   Active Problems:   Hyperlipidemia associated with type 2 diabetes mellitus (HCC)   Hypertension associated with diabetes (HCC)   Type 2 diabetes mellitus (HCC)   History of BPH   Gout flare   Elevated lactic acid level   Foot pain, right   Unsteady gait   #) Acute bilateral gout: Due to severity of gout patient cannot ambulate because of the swelling in his legs and the requirement for IV pain medications - Status post bilateral arthrocentesis and steroid injection on 11/05/2018 -Continue allopurinol 100 mg daily -Continue IV methylprednisolone 60 mg daily - Continue colchicine 0.6 mg daily, will reload -Physical therapy consult, recommends skilled nursing facility -We will discuss with pharmacy about one-time dose of rasburicase  #)hypertension/hyperlipidemia: -Continue amlodipine 5 mg daily - Continue atorvastatin 20 mg nightly - Continue metoprolol tartrate 100 mg twice daily  #) Type 2 diabetes on insulin: -Hold 30 units of regular with meals -Continue sliding scale insulin, SHS - Continue glargine 20 units nightly -Continue aspart 6 units with meals  #) Stage II CKD: This appears to be stable  Fluids: Tolerating p.o. Electrodes: Monitor and supplement Nutrition: Carb restricted diet  Prophylaxis: Enoxaparin  Disposition: Pending physical therapy evaluation  Full code     Consultants:   Orthopedic surgery  Procedures:   11/05/2018 bilateral ankle arthrocentesis and steroid injection  Antimicrobials:   None   Subjective: This morning patient reports feeling a little bit worse.  He reports that his  pain is improved somewhat but he continues to have pain that prevents his ambulate him.  He denies any nausea, vomiting, diarrhea, cough, congestion, rhinorrhea.  Objective: Vitals:   11/07/18 1331 11/07/18 2048 11/08/18 0508 11/08/18 0931  BP: 121/71 (!) 144/70 (!) 141/78 (!) 143/76  Pulse: 61 (!) 51 (!) 51 (!) 50  Resp: 18 16 14    Temp: 97.7 F (36.5 C) 97.8 F (36.6 C) (!) 97.3 F (36.3 C)   TempSrc: Oral Oral Oral   SpO2: 93% 95% 95%   Weight:      Height:        Intake/Output Summary (Last 24 hours) at 11/08/2018 1310 Last data filed at 11/08/2018 1037 Gross per 24 hour  Intake 1573.29 ml  Output 2700 ml  Net -1126.71 ml   Filed Weights   11/03/18 1333  Weight: 83.9 kg    Examination:  General exam: Appears calm and comfortable  Respiratory system: Clear to auscultation. Respiratory effort normal. Cardiovascular system: Regular rate and rhythm, no murmurs. Gastrointestinal system: Abdomen is nondistended, soft and nontender. No organomegaly or masses felt. Normal bowel sounds heard. Central nervous system: Alert and oriented.  Grossly intact, moving all extremities Extremities: Bilateral ankle swollen, pain with eversion, plantarflexion and dorsiflexion, inversion, intact distal pulses. Skin: No rashes over visible skin Psychiatry: Judgement and insight appear normal. Mood & affect appropriate.     Data Reviewed: I have personally reviewed following labs and imaging studies  CBC: Recent Labs  Lab 11/02/18 1231 11/03/18 1407 11/03/18 1806 11/04/18 0413 11/08/18 0243  WBC 10.0 9.5 9.3 7.9 8.8  NEUTROABS  --  7.7  --   --   --  HGB 13.8 12.3* 11.8* 11.3* 11.2*  HCT 41.3 39.0 37.0* 34.0* 34.2*  MCV 90.8 92.6 91.4 91.9 91.4  PLT 141* 173 161 143* 250   Basic Metabolic Panel: Recent Labs  Lab 11/04/18 0413 11/05/18 0140 11/06/18 0143 11/07/18 0130 11/08/18 0243  NA 136 136 133* 134* 133*  K 3.9 4.0 4.1 4.4 4.3  CL 106 104 104 108 106  CO2 21* 26  21* 19* 18*  GLUCOSE 194* 246* 334* 293* 314*  BUN 31* 44* 54* 53* 53*  CREATININE 1.87* 1.62* 1.61* 1.39* 1.37*  CALCIUM 8.3* 8.3* 8.1* 8.0* 8.0*  MG  --   --   --   --  2.4   GFR: Estimated Creatinine Clearance: 53.9 mL/min (A) (by C-G formula based on SCr of 1.37 mg/dL (H)). Liver Function Tests: Recent Labs  Lab 11/04/18 0413  AST 23  ALT 23  ALKPHOS 36*  BILITOT 1.1  PROT 6.3*  ALBUMIN 2.3*   No results for input(s): LIPASE, AMYLASE in the last 168 hours. No results for input(s): AMMONIA in the last 168 hours. Coagulation Profile: No results for input(s): INR, PROTIME in the last 168 hours. Cardiac Enzymes: No results for input(s): CKTOTAL, CKMB, CKMBINDEX, TROPONINI in the last 168 hours. BNP (last 3 results) No results for input(s): PROBNP in the last 8760 hours. HbA1C: No results for input(s): HGBA1C in the last 72 hours. CBG: Recent Labs  Lab 11/07/18 1257 11/07/18 1806 11/07/18 2147 11/08/18 0845 11/08/18 1157  GLUCAP 336* 316* 371* 269* 254*   Lipid Profile: No results for input(s): CHOL, HDL, LDLCALC, TRIG, CHOLHDL, LDLDIRECT in the last 72 hours. Thyroid Function Tests: No results for input(s): TSH, T4TOTAL, FREET4, T3FREE, THYROIDAB in the last 72 hours. Anemia Panel: No results for input(s): VITAMINB12, FOLATE, FERRITIN, TIBC, IRON, RETICCTPCT in the last 72 hours. Sepsis Labs: Recent Labs  Lab 11/02/18 1659 11/02/18 2008 11/03/18 1419 11/03/18 1612  LATICACIDVEN 1.39 1.47 2.41* 1.16    Recent Results (from the past 240 hour(s))  Culture, blood (routine x 2)     Status: None   Collection Time: 11/02/18  1:59 PM  Result Value Ref Range Status   Specimen Description   Final    BLOOD LEFT ANTECUBITAL Performed at [facility], [address] [place], [place] [address]    Special Requests   Final    BOTTLES DRAWN AEROBIC AND ANAEROBIC Blood Culture results may not be optimal due to an excessive volume of blood received in  culture bottles Performed at [facility], [address], [place], [place] [address]    Culture   Final    NO GROWTH 5 DAYS Performed at [facility], [address], [place], [place] [address]    Report Status 11/07/2018 FINAL  Final  Culture, blood (routine x 2)     Status: None   Collection Time: 11/02/18  2:17 PM  Result Value Ref Range Status   Specimen Description   Final    BLOOD LEFT ANTECUBITAL Performed at [facility], [address] [place], [place] [address]    Special Requests   Final    BOTTLES DRAWN AEROBIC AND ANAEROBIC Blood Culture adequate volume Performed at [facility], [address], [place], [place] [address]    Culture   Final    NO GROWTH 5 DAYS Performed at [facility], [address], [place], [place] [address]    Report Status 11/07/2018 FINAL  Final  Urine culture     Status: None   Collection Time: 11/02/18  8:01 PM  Result Value Ref Range Status   Specimen Description   Final    URINE, RANDOM Performed at [facility], [address], [place], [place] [address]    Special Requests   Final    NONE Performed at [facility], [address], [place], [place] [address]    Culture   Final    NO GROWTH Performed at [facility], [address] [place], [place] [address]    Report Status 11/04/2018 FINAL  Final         Radiology Studies: No results found.      Scheduled Meds: . [START ON 11/09/2018] allopurinol  100 mg Oral Daily  . amLODipine  5 mg Oral Daily  . aspirin  81 mg Oral Daily  . atorvastatin  20 mg Oral q1800  . bupivacaine  10 mL Infiltration Once  . clonazePAM  0.5 mg Oral BID  . colchicine  0.6 mg Oral Daily  . enoxaparin (LOVENOX) injection  40 mg Subcutaneous Q24H  . insulin aspart  0-15 Units Subcutaneous TID WC  . insulin aspart  0-5 Units Subcutaneous QHS  . insulin aspart  6 Units  Subcutaneous TID WC  . insulin glargine  20 Units Subcutaneous Daily  . methylPREDNISolone acetate  40 mg Intra-articular Once  . methylPREDNISolone acetate  40 mg Intra-articular Once  . methylPREDNISolone (SOLU-MEDROL) injection  60 mg Intravenous Q24H  . metoprolol tartrate  100 mg Oral BID   Continuous Infusions:    LOS: 3 days    Time spent: 35    Delaine Lame, MD [facility]  If 7PM-7AM, please contact night-coverage [email or web address] Password [email or web address] 11/08/2018, 1:10 PM

## 2018-11-08 NOTE — Plan of Care (Signed)
  Problem: Elimination: Goal: Will not experience complications related to bowel motility Outcome: Progressing Goal: Will not experience complications related to urinary retention Outcome: Progressing   Problem: Pain Managment: Goal: General experience of comfort will improve Outcome: Progressing   Problem: Skin Integrity: Goal: Risk for impaired skin integrity will decrease Outcome: Progressing   

## 2018-11-08 NOTE — Progress Notes (Signed)
Physical Therapy Treatment Patient Details Name: Jerry York MRN: [id number] DOB: March 08, 1951 Today's Date: 11/08/2018    History of Present Illness 67 y.o. male with medical history significant of DM2, CKD, HTN, HLD, anxiety and gout who presents with acute gout flare.  He was seen in the ED yesterday for the same.  He reports that the pain started in his ankle of his right foot and has progressed to his complete foot and with shooting pain up his legs.    PT Comments    Patient seen for mobility progression. Pt on BSC upon arrival and assisted from [facility] to recliner. Pt able to partial stand briefly X 3 during session and Stedy standing frame utilized for transfer. Pt requires max A +2 for sit to stand transfers due to continued bilat LE pain and decreased activity tolerance. Continue to progress as tolerated with anticipated d/c to SNF for further skilled PT services.     Follow Up Recommendations  SNF     Equipment Recommendations  Rolling walker with 5" wheels;Wheelchair (measurements PT);Wheelchair cushion (measurements PT);Other (comment)    Recommendations for Other Services       Precautions / Restrictions Precautions Precautions: Fall    Mobility  Bed Mobility               General bed mobility comments: pt on BSC upon arrival   Transfers Overall transfer level: Needs assistance Equipment used: Rolling walker (2 wheeled) Transfers: Sit to/from Stand Sit to Stand: Max assist;+2 physical assistance;Mod assist         General transfer comment: max cues for positioning and technique and max A +2 to power up into partial stand with facilitation of hip extension; pt to partial stand long enough for pericare with use of RW but unable to maintain standing or improved posture so returned to sitting; pt stood X2 with use of Stedy standing frame with grossly max A +2 but from Stedy frame seat mod A +2   Ambulation/Gait             General Gait Details: unable  to progress gait due to pain   Stairs             Wheelchair Mobility    Modified Rankin (Stroke Patients Only)       Balance Overall balance assessment: Needs assistance   Sitting balance-Leahy Scale: Fair       Standing balance-Leahy Scale: Zero                              Cognition Arousal/Alertness: Awake/alert Behavior During Therapy: WFL for tasks assessed/performed Overall Cognitive Status: Within Functional Limits for tasks assessed                                        Exercises      General Comments        Pertinent Vitals/Pain Pain Assessment: Faces Faces Pain Scale: Hurts whole lot Pain Location: bilat LE Pain Descriptors / Indicators: Sharp;Grimacing;Guarding Pain Intervention(s): Limited activity within patient's tolerance;Monitored during session;Repositioned;Patient requesting pain meds-RN notified    Home Living                      Prior Function            PT Goals (current goals can now be found  in the care plan section) Progress towards PT goals: Progressing toward goals    Frequency    Min 3X/week      PT Plan Current plan remains appropriate    Co-evaluation              AM-PAC PT "6 Clicks" Daily Activity  Outcome Measure  Difficulty turning over in bed (including adjusting bedclothes, sheets and blankets)?: A Lot Difficulty moving from lying on back to sitting on the side of the bed? : Unable Difficulty sitting down on and standing up from a chair with arms (e.g., wheelchair, bedside commode, etc,.)?: Unable Help needed moving to and from a bed to chair (including a wheelchair)?: A Lot Help needed walking in hospital room?: Total Help needed climbing 3-5 steps with a railing? : Total 6 Click Score: 8    End of Session Equipment Utilized During Treatment: Gait belt Activity Tolerance: Patient limited by pain;Patient limited by fatigue Patient left: in chair;with  call bell/phone within reach Nurse Communication: Mobility status PT Visit Diagnosis: Unsteadiness on feet (R26.81);Other abnormalities of gait and mobility (R26.89);Pain Pain - Right/Left: (bilateral) Pain - part of body: Leg     Time: 1020-1035 PT Time Calculation (min) (ACUTE ONLY): 15 min  Charges:  $Therapeutic Activity: 8-22 mins                     Erline Levine, PTA Acute Rehabilitation Services Pager: [phone] Office: [phone]     Carolynne Edouard 11/08/2018, 12:20 PM

## 2018-11-08 NOTE — [place] FL2 (Signed)
[place] MEDICAID FL2 LEVEL OF CARE SCREENING TOOL     IDENTIFICATION  Patient Name: Jerry York Birthdate: [birth year] Sex: male Admission Date (Current Location): 11/03/2018  [facility] and [place] Number:  Producer, television/film/video and Address:  [facility] [place], [address], [place], [place] [address]      Provider Number: [id number]  Attending Physician Name and Address:  Delaine Lame, MD  Relative Name and Phone Number:  Marcy Bogosian; wife; [phone]    Current Level of Care: Hospital Recommended Level of Care: Skilled Nursing Facility Prior Approval Number:    Date Approved/Denied:   PASRR Number: pending  Discharge Plan: SNF    Current Diagnoses: Patient Active Problem List   Diagnosis Date Noted  . Elevated lactic acid level   . Foot pain, right   . Unsteady gait   . Gout flare 11/02/2018  . Right knee pain 01/10/2018  . Lower extremity edema 12/11/2017  . Hypertriglyceridemia 11/27/2017  . Family history of premature CAD 11/27/2017  . 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 History of BPH 08/14/2017  . Type 2 diabetes mellitus (HCC) 08/09/2017  . Gout 08/09/2017  . Hydronephrosis, left 08/09/2017  . Anxiety 08/09/2017  . Chest pain with low risk for cardiac etiology 08/09/2017  . Hyperlipidemia associated with type 2 diabetes mellitus (HCC) 08/08/2017  . Hypertension associated with diabetes (HCC) 08/08/2017    Orientation RESPIRATION BLADDER Height & Weight     Self, Time, Situation, Place  Normal Continent, External catheter Weight: 185 lb (83.9 kg) Height:  5\' 6"  (167.6 cm)  BEHAVIORAL SYMPTOMS/MOOD NEUROLOGICAL BOWEL NUTRITION STATUS      Continent Diet(see discharge summary)  AMBULATORY STATUS COMMUNICATION OF NEEDS Skin   Extensive Assist Verbally Skin abrasions(abrasion on right hand with gauze dressing)                       Personal Care Assistance Level of Assistance   Bathing, Dressing, Feeding Bathing Assistance: Maximum assistance Feeding assistance: Independent Dressing Assistance: Limited assistance     Functional Limitations Info  Sight, Hearing, Speech Sight Info: Adequate Hearing Info: Adequate Speech Info: Adequate    SPECIAL CARE FACTORS FREQUENCY  PT (By licensed PT), OT (By licensed OT)     PT Frequency: 5x week OT Frequency: 5x week            Contractures Contractures Info: Not present    Additional Factors Info  Code Status, Allergies, Psychotropic, Insulin Sliding Scale Code Status Info: Full Code Allergies Info: PREDNISONE, METFORMIN AND RELATED, SULFA ANTIBIOTICS  Psychotropic Info: clonazePAM (KLONOPIN) tablet 0.5 mg 2x daily PO Insulin Sliding Scale Info: insulin aspart (novoLOG) injection 0-15 Units 3x daily with meals; insulin aspart (novoLOG) injection 0-5 Units daily at bedtime; insulin aspart (novoLOG) injection 6 Units 3x daily with meals       Current Medications (11/08/2018):  This is the current hospital active medication list Current Facility-Administered Medications  Medication Dose Route Frequency Provider Last Rate Last Dose  . acetaminophen (TYLENOL) tablet 650 mg  650 mg Oral Q6H PRN 11/10/2018, MD   650 mg at 11/08/18 0548  . allopurinol (ZYLOPRIM) tablet 50 mg  50 mg Oral Daily 11/10/18, MD   50 mg at 11/08/18 0935  . amLODipine (NORVASC) tablet 5 mg  5 mg Oral Daily 11/10/18 B, MD   5 mg at 11/08/18 0935  . aspirin chewable tablet 81  mg  81 mg Oral Daily Inez Catalina, MD   81 mg at 11/08/18 0934  . atorvastatin (LIPITOR) tablet 20 mg  20 mg Oral q1800 Debe Coder B, MD   20 mg at 11/07/18 1721  . bupivacaine (MARCAINE) 0.5 % injection 10 mL  10 mL Infiltration Once Freeman Caldron, PA-C      . clonazePAM Scarlette Calico) tablet 0.5 mg  0.5 mg Oral BID Debe Coder B, MD   0.5 mg at 11/08/18 0935  . colchicine tablet 0.6 mg  0.6 mg Oral Daily Debe Coder B, MD   0.6 mg at  11/08/18 0935  . enoxaparin (LOVENOX) injection 40 mg  40 mg Subcutaneous Q24H Debe Coder B, MD   40 mg at 11/07/18 1721  . insulin aspart (novoLOG) injection 0-15 Units  0-15 Units Subcutaneous TID WC Inez Catalina, MD   8 Units at 11/08/18 0934  . insulin aspart (novoLOG) injection 0-5 Units  0-5 Units Subcutaneous QHS Inez Catalina, MD   5 Units at 11/07/18 2149  . insulin aspart (novoLOG) injection 6 Units  6 Units Subcutaneous TID WC Jerald Kief, MD   6 Units at 11/08/18 [phone]  . insulin glargine (LANTUS) injection 20 Units  20 Units Subcutaneous Daily Jerald Kief, MD   20 Units at 11/08/18 [phone]  . methylPREDNISolone acetate (DEPO-MEDROL) injection 40 mg  40 mg Intra-articular Once Freeman Caldron, PA-C      . methylPREDNISolone acetate (DEPO-MEDROL) injection 40 mg  40 mg Intra-articular Once Freeman Caldron, PA-C      . methylPREDNISolone sodium succinate (SOLU-MEDROL) 125 mg/2 mL injection 60 mg  60 mg Intravenous Q24H Jerald Kief, MD   60 mg at 11/08/18 0931  . metoprolol tartrate (LOPRESSOR) tablet 100 mg  100 mg Oral BID Debe Coder B, MD   100 mg at 11/07/18 2149  . morphine 2 MG/ML injection 2 mg  2 mg Intravenous Q2H PRN Inez Catalina, MD   2 mg at 11/07/18 2050  . oxyCODONE (Oxy IR/ROXICODONE) immediate release tablet 5 mg  5 mg Oral Q4H PRN Inez Catalina, MD   5 mg at 11/08/18 2585     Discharge Medications: Please see discharge summary for a list of discharge medications.  Relevant Imaging Results:  Relevant Lab Results:   Additional Information SS# [id number] [place], [place]

## 2018-11-08 NOTE — Clinical Social Work Note (Signed)
Clinical Social Work Assessment  Patient Details  Name: Jerry York MRN: [id number] Date of Birth: Aug 24, 1951  Date of referral:  11/08/18               Reason for consult:  Facility Placement, Discharge Planning                Permission sought to share information with:  Family Supports, Magazine features editor Permission granted to share information::  Yes, Verbal Permission Granted  Name::     Jerry York  Agency::  SNFs  Relationship::  daughter  Contact Information:  [phone]  Housing/Transportation Living arrangements for the past 2 months:  Single Family Home Source of Information:  Patient Patient Interpreter Needed:  None Criminal Activity/Legal Involvement Pertinent to Current Situation/Hospitalization:  No - Comment as needed Significant Relationships:  Adult Children, Spouse Lives with:  Spouse Do you feel safe going back to the place where you live?  Yes Need for family participation in patient care:  Yes (Comment)  Care giving concerns:  Pt retired, lives at home with his wife, feels due to his current gout flare up that he is unable to return home without more assistance than he has. Pt recommended/desiring to go to SNF.   Social Worker assessment / plan:  CSW spoke with pt at bedside. Pt alert and up in chair. Introduced self, role, and reason for visit. Pt lives at home with his wife, and has an adult daughter. Pt states that he does not feel comfortable going home currently due to his gout flare up causing pain and limited mobility. Pt has never been to SNF, interested in seeing offers and discussing them with his wife this afternoon.   Pt understands SNF referral process. CSW will submit referral, PASRR pending.  Employment status:  Retired Database administrator PT Recommendations:  Skilled Nursing Facility Information / Referral to community resources:  Skilled Nursing Facility  Patient/Family's Response to care:  Pt  amenable to speaking with CSW, had several questions regarding set up of SNF and what type of staffing/care is provided.  Patient/Family's Understanding of and Emotional Response to Diagnosis, Current Treatment, and Prognosis:  Pt states understanding of diagnosis, current treatment, and prognosis. He is worried that he will take awhile to manage this gout flare up. He was emotionally appropriate and asked appropriate and relevant questions regarding next steps and current hospitalization.   Emotional Assessment Appearance:  Appears stated age Attitude/Demeanor/Rapport:  Engaged, Gracious Affect (typically observed):  Accepting, Adaptable, Appropriate, Quiet Orientation:  Oriented to Self, Oriented to Place, Oriented to  Time, Oriented to Situation Alcohol / Substance use:  Not Applicable Psych involvement (Current and /or in the community):  No (Comment)  Discharge Needs  Concerns to be addressed:  Care Coordination Readmission within the last 30 days:  Yes Current discharge risk:  Dependent with Mobility, Physical Impairment Barriers to Discharge:  Awaiting Secretary/administrator Psychologist, educational), English as a second language teacher, Continued Medical Work up   [employer], LCSWA 11/08/2018, 11:50 AM

## 2018-11-08 NOTE — Social Work (Addendum)
CSW acknowledging that pt now SNF recommended. Have called in insurance authorization.   4:00pm- Spoke with pt and pt wife at bedside, gave them packet of offers. Pt has been approved for an initial 7 days for SNF under auth #[id number]  Octavio Graves, MSW, [facility] Clinical Social Work [phone]

## 2018-11-08 NOTE — Care Management Important Message (Signed)
Important Message  Patient Details  Name: Jerry York MRN: [id number] Date of Birth: 07/23/1951   Medicare Important Message Given:  Yes    Khyre Germond 11/08/2018, 3:30 PM

## 2018-11-09 ENCOUNTER — Encounter: Payer: Self-pay | Admitting: *Deleted

## 2018-11-09 LAB — CBC
HCT: 36.4 % — ABNORMAL LOW (ref 39.0–52.0)
Hemoglobin: 11.5 g/dL — ABNORMAL LOW (ref 13.0–17.0)
MCH: 29 pg (ref 26.0–34.0)
MCHC: 31.6 g/dL (ref 30.0–36.0)
MCV: 91.9 fL (ref 80.0–100.0)
Platelets: 300 K/uL (ref 150–400)
RBC: 3.96 MIL/uL — ABNORMAL LOW (ref 4.22–5.81)
RDW: 15.2 % (ref 11.5–15.5)
WBC: 9.7 10*3/uL (ref 4.0–10.5)
nRBC: 1.2 % — ABNORMAL HIGH (ref 0.0–0.2)

## 2018-11-09 LAB — GLUCOSE, CAPILLARY
GLUCOSE-CAPILLARY: 256 mg/dL — AB (ref 70–99)
GLUCOSE-CAPILLARY: 163 mg/dL — AB (ref 70–99)
Glucose-Capillary: 234 mg/dL — ABNORMAL HIGH (ref 70–99)
Glucose-Capillary: 288 mg/dL — ABNORMAL HIGH (ref 70–99)

## 2018-11-09 LAB — BASIC METABOLIC PANEL
BUN: 43 mg/dL — ABNORMAL HIGH (ref 8–23)
CO2: 21 mmol/L — ABNORMAL LOW (ref 22–32)
Calcium: 8.3 mg/dL — ABNORMAL LOW (ref 8.9–10.3)
Chloride: 108 mmol/L (ref 98–111)
Creatinine, Ser: 1.52 mg/dL — ABNORMAL HIGH (ref 0.61–1.24)
Glucose, Bld: 284 mg/dL — ABNORMAL HIGH (ref 70–99)

## 2018-11-09 LAB — BASIC METABOLIC PANEL WITH GFR
Anion gap: 7 (ref 5–15)
GFR calc Af Amer: 53 mL/min — ABNORMAL LOW (ref >60)
GFR calc non Af Amer: 46 mL/min — ABNORMAL LOW (ref >60)
Potassium: 4.7 mmol/L (ref 3.5–5.1)
Sodium: 136 mmol/L (ref 135–145)

## 2018-11-09 MED ORDER — INSULIN ASPART 100 UNIT/ML ~~LOC~~ SOLN
10.0000 [IU] | Freq: Three times a day (TID) | SUBCUTANEOUS | Status: DC
  Administered 2018-11-09 – 2018-11-10 (×4): 10 [IU] via SUBCUTANEOUS

## 2018-11-09 MED ORDER — INSULIN GLARGINE 100 UNIT/ML ~~LOC~~ SOLN
30.0000 [IU] | Freq: Every day | SUBCUTANEOUS | Status: DC
  Administered 2018-11-09 – 2018-11-10 (×2): 30 [IU] via SUBCUTANEOUS
  Filled 2018-11-09 (×3): qty 0.3

## 2018-11-09 MED ORDER — PREDNISONE 20 MG PO TABS
40.0000 mg | ORAL_TABLET | Freq: Every day | ORAL | Status: DC
  Administered 2018-11-10: 40 mg via ORAL
  Filled 2018-11-09: qty 2

## 2018-11-09 NOTE — Clinical Social Work Placement (Signed)
   CLINICAL SOCIAL WORK PLACEMENT  NOTE [facility]  Date:  11/09/2018  Patient Details  Name: Jerry York MRN: [id number] Date of Birth: 08/17/1951  Clinical Social Work is seeking post-discharge placement for this patient at the Skilled  Nursing Facility level of care (*CSW will initial, date and re-position this form in  chart as items are completed):  Yes   Patient/family provided with [place] Clinical Social Work Department's list of facilities offering this level of care within the geographic area requested by the patient (or if unable, by the patient's family).  Yes   Patient/family informed of their freedom to choose among providers that offer the needed level of care, that participate in Medicare, Medicaid or managed care program needed by the patient, have an available bed and are willing to accept the patient.  Yes   Patient/family informed of 's ownership interest in [facility] and [facility], as well as of the fact that they are under no obligation to receive care at these facilities.  PASRR submitted to EDS on       PASRR number received on 11/09/18     Existing PASRR number confirmed on       FL2 transmitted to all facilities in geographic area requested by pt/family on 11/07/18     FL2 transmitted to all facilities within larger geographic area on       Patient informed that his/her managed care company has contracts with or will negotiate with certain facilities, including the following:        Yes   Patient/family informed of bed offers received.  Patient [facility] bed at [facility]     Physician recommends and patient chooses bed at      Patient to be transferred to [facility] on 11/10/18.  Patient to be transferred to facility by PTAR     Patient family notified on 11/09/18 of transfer.  Name of family member notified:  pt wife     PHYSICIAN Please prepare priority discharge summary, including medications, Please prepare  prescriptions     Additional Comment:    _______________________________________________ Doy Hutching, LCSWA 11/09/2018, 5:25 PM

## 2018-11-09 NOTE — Progress Notes (Signed)
PROGRESS NOTE    Jerry York  OHF:[id number] DOB: 1951/02/14 DOA: 11/03/2018 PCP: Ardith Dark, MD   Brief Narrative:  67 year old with past medical history relevant for hypertension, hyperlipidemia, type 2 diabetes on insulin, stage II CKD who was admitted with severe foot pain found to have bilateral gout and ankles.   Assessment & Plan:   Active Problems:   Hyperlipidemia associated with type 2 diabetes mellitus (HCC)   Hypertension associated with diabetes (HCC)   Type 2 diabetes mellitus (HCC)   History of BPH   Gout flare   Elevated lactic acid level   Foot pain, right   Unsteady gait   #) Acute bilateral gout: Due to severity of gout patient cannot ambulate because of the swelling in his legs and the requirement for IV pain medications - Status post bilateral arthrocentesis and steroid injection on 11/05/2018 -Continue allopurinol 100 mg daily -Discontinue IV methylprednisolone, start prednisone 40 mg daily - Continue colchicine 0.6 mg daily, patient did receive a reload on 11/08/2018 -Physical therapy consult, recommends skilled nursing facility  #)hypertension/hyperlipidemia: -Continue amlodipine 5 mg daily - Continue atorvastatin 20 mg nightly - Continue metoprolol tartrate 100 mg twice daily  #) Type 2 diabetes on insulin: -Hold 30 units of regular with meals -Continue sliding scale insulin, SHS - Increase glargine 30 units nightly -Increase aspart 10 units with meals  #) Stage II CKD: This appears to be stable  Fluids: Tolerating p.o. Electrodes: Monitor and supplement Nutrition: Carb restricted diet  Prophylaxis: Enoxaparin  Disposition: Pending discharge to skilled nursing facility  Full code     Consultants:   Orthopedic surgery  Procedures:   11/05/2018 bilateral ankle arthrocentesis and steroid injection  Antimicrobials:   None   Subjective: This morning patient reports feeling about the same.  He denies any nausea,  vomiting, diarrhea, cough, congestion, rhinorrhea.  He continues to have lower extremity swelling and pain.  Objective: Vitals:   11/08/18 0931 11/08/18 1400 11/08/18 2029 11/09/18 0531  BP: (!) 143/76 132/80 (!) 144/80 (!) 143/82  Pulse: (!) 50 (!) 57 64 (!) 48  Resp:  16 18 16   Temp:  98 F (36.7 C) 98.4 F (36.9 C) (!) 97.4 F (36.3 C)  TempSrc:  Oral Oral Oral  SpO2:  95% 96% 96%  Weight:      Height:        Intake/Output Summary (Last 24 hours) at 11/09/2018 1236 Last data filed at 11/09/2018 1026 Gross per 24 hour  Intake 660 ml  Output 2750 ml  Net -2090 ml   Filed Weights   11/03/18 1333  Weight: 83.9 kg    Examination:  General exam: Appears calm and comfortable  Respiratory system: Clear to auscultation. Respiratory effort normal. Cardiovascular system: Regular rate and rhythm, no murmurs. Gastrointestinal system: Abdomen is nondistended, soft and nontender. No organomegaly or masses felt. Normal bowel sounds heard. Central nervous system: Alert and oriented.  Grossly intact, moving all extremities Extremities: Bilateral ankle swollen, pain with eversion, plantarflexion and dorsiflexion, inversion, intact distal pulses. Skin: No rashes over visible skin Psychiatry: Judgement and insight appear normal. Mood & affect appropriate.     Data Reviewed: I have personally reviewed following labs and imaging studies  CBC: Recent Labs  Lab 11/03/18 1407 11/03/18 1806 11/04/18 0413 11/08/18 0243 11/09/18 0300  WBC 9.5 9.3 7.9 8.8 9.7  NEUTROABS 7.7  --   --   --   --   HGB 12.3* 11.8* 11.3* 11.2* 11.5*  HCT 39.0  37.0* 34.0* 34.2* 36.4*  MCV 92.6 91.4 91.9 91.4 91.9  PLT 173 161 143* 250 300   Basic Metabolic Panel: Recent Labs  Lab 11/05/18 0140 11/06/18 0143 11/07/18 0130 11/08/18 0243 11/09/18 0300  NA 136 133* 134* 133* 136  K 4.0 4.1 4.4 4.3 4.7  CL 104 104 108 106 108  CO2 26 21* 19* 18* 21*  GLUCOSE 246* 334* 293* 314* 284*  BUN 44* 54*  53* 53* 43*  CREATININE 1.62* 1.61* 1.39* 1.37* 1.52*  CALCIUM 8.3* 8.1* 8.0* 8.0* 8.3*  MG  --   --   --  2.4  --    GFR: Estimated Creatinine Clearance: 48.5 mL/min (A) (by C-G formula based on SCr of 1.52 mg/dL (H)). Liver Function Tests: Recent Labs  Lab 11/04/18 0413  AST 23  ALT 23  ALKPHOS 36*  BILITOT 1.1  PROT 6.3*  ALBUMIN 2.3*   No results for input(s): LIPASE, AMYLASE in the last 168 hours. No results for input(s): AMMONIA in the last 168 hours. Coagulation Profile: No results for input(s): INR, PROTIME in the last 168 hours. Cardiac Enzymes: No results for input(s): CKTOTAL, CKMB, CKMBINDEX, TROPONINI in the last 168 hours. BNP (last 3 results) No results for input(s): PROBNP in the last 8760 hours. HbA1C: No results for input(s): HGBA1C in the last 72 hours. CBG: Recent Labs  Lab 11/08/18 1157 11/08/18 1800 11/08/18 2135 11/09/18 0831 11/09/18 1221  GLUCAP 254* 301* 318* 256* 163*   Lipid Profile: No results for input(s): CHOL, HDL, LDLCALC, TRIG, CHOLHDL, LDLDIRECT in the last 72 hours. Thyroid Function Tests: No results for input(s): TSH, T4TOTAL, FREET4, T3FREE, THYROIDAB in the last 72 hours. Anemia Panel: No results for input(s): VITAMINB12, FOLATE, FERRITIN, TIBC, IRON, RETICCTPCT in the last 72 hours. Sepsis Labs: Recent Labs  Lab 11/02/18 1659 11/02/18 2008 11/03/18 1419 11/03/18 1612  LATICACIDVEN 1.39 1.47 2.41* 1.16    Recent Results (from the past 240 hour(s))  Culture, blood (routine x 2)     Status: None   Collection Time: 11/02/18  1:59 PM  Result Value Ref Range Status   Specimen Description   Final    BLOOD LEFT ANTECUBITAL Performed at [facility], [address] [place], [place] [address]    Special Requests   Final    BOTTLES DRAWN AEROBIC AND ANAEROBIC Blood Culture results may not be optimal due to an excessive volume of blood received in culture bottles Performed at [facility], [address], [place], [place] [address]    Culture   Final    NO GROWTH 5 DAYS Performed at [facility], [address], [place], [place] [address]    Report Status 11/07/2018 FINAL  Final  Culture, blood (routine x 2)     Status: None   Collection Time: 11/02/18  2:17 PM  Result Value Ref Range Status   Specimen Description   Final    BLOOD LEFT ANTECUBITAL Performed at [facility], [address] [place], [place] [address]    Special Requests   Final    BOTTLES DRAWN AEROBIC AND ANAEROBIC Blood Culture adequate volume Performed at [facility], [address] [place], [place] [address]    Culture   Final    NO GROWTH 5 DAYS Performed at [facility], [address], [place], [place] [address]    Report Status 11/07/2018 FINAL  Final  Urine culture  Status: None   Collection Time: 11/02/18  8:01 PM  Result Value Ref Range Status   Specimen Description   Final    URINE, RANDOM Performed at [facility], [address], [place], [place] [address]    Special Requests   Final    NONE Performed at [facility], [address] [place], [place] [address]    Culture   Final    NO GROWTH Performed at [facility], [address], [place], [place] [address]    Report Status 11/04/2018 FINAL  Final         Radiology Studies: No results found.      Scheduled Meds: . allopurinol  100 mg Oral Daily  . amLODipine  5 mg Oral Daily  . aspirin  81 mg Oral Daily  . atorvastatin  20 mg Oral q1800  . bupivacaine  10 mL Infiltration Once  . clonazePAM  0.5 mg Oral BID  . colchicine  0.6 mg Oral Daily  . enoxaparin (LOVENOX) injection  40 mg Subcutaneous Q24H  . insulin aspart  0-15 Units Subcutaneous TID WC  . insulin aspart  0-5 Units Subcutaneous QHS  . insulin aspart  10 Units Subcutaneous TID WC  . insulin glargine  30 Units Subcutaneous Daily  .  methylPREDNISolone acetate  40 mg Intra-articular Once  . methylPREDNISolone acetate  40 mg Intra-articular Once  . methylPREDNISolone (SOLU-MEDROL) injection  60 mg Intravenous Q24H  . metoprolol tartrate  100 mg Oral BID   Continuous Infusions:    LOS: 4 days    Time spent: 35    Delaine Lame, MD [facility]  If 7PM-7AM, please contact night-coverage [email or web address] Password [facility] 11/09/2018, 12:36 PM

## 2018-11-09 NOTE — Progress Notes (Signed)
Responded to PIV consult. Pt up to restroom and requests return later.

## 2018-11-09 NOTE — Social Work (Signed)
PASRR received- [id number] E.  No bed offer from [facility], second choice [facility] has a bed for discharge tomorrow. PASRR received, [facility] aware d/c likely tomorrow.   [facility] also updated. All PTAR papers on chart. Please call weekend CSW at [phone] with discharge timing.   If pt has pain medications/controlled substances please print scripts and leave on chart.   Octavio Graves, MSW, [facility] Clinical Social Work [phone]

## 2018-11-09 NOTE — Consult Note (Signed)
   [facility] CM Inpatient Consult   11/09/2018  Jerry York 1951-10-26 [id number]   Went to bedside to speak with [name] and wife [name] about [facility] Care Management services. They are agreeable and [facility] Care Management written consent obtained. [facility] folder provided.   Both patient wife report that they have spoken to [facility] Pharmacist and [facility] Telephonic RNCM  already. [name] states " I am in the doughnut hole. There is nothing they can do to help."  [name] reports the discharge plan is for SNF. Discussed [facility] Care Management follow up while at SNF and for [facility] follow up upon discharge home from SNF.   Confirmed Primary Care MD is Dr. [name] ([facility] is listed as doing transition of care call).   Confirmed best contact number for [name] as [phone] and [name] wife cell is [phone].  Will make inpatient RNCM aware [facility] will follow post discharge.   Will await to make [facility] referral once disposition known.   [name] has a history of DM, CKD, HTN, HLD, gout.   Raiford Noble, MSN-Ed, RN,BSN [facility] Liaison [phone]

## 2018-11-10 DIAGNOSIS — E785 Hyperlipidemia, unspecified: Secondary | ICD-10-CM | POA: Diagnosis not present

## 2018-11-10 DIAGNOSIS — Z7401 Bed confinement status: Secondary | ICD-10-CM | POA: Diagnosis not present

## 2018-11-10 DIAGNOSIS — N183 Chronic kidney disease, stage 3 (moderate): Secondary | ICD-10-CM | POA: Diagnosis not present

## 2018-11-10 DIAGNOSIS — M10079 Idiopathic gout, unspecified ankle and foot: Secondary | ICD-10-CM

## 2018-11-10 DIAGNOSIS — M10011 Idiopathic gout, right shoulder: Secondary | ICD-10-CM | POA: Diagnosis not present

## 2018-11-10 DIAGNOSIS — B370 Candidal stomatitis: Secondary | ICD-10-CM | POA: Diagnosis not present

## 2018-11-10 DIAGNOSIS — F419 Anxiety disorder, unspecified: Secondary | ICD-10-CM | POA: Diagnosis not present

## 2018-11-10 DIAGNOSIS — G8929 Other chronic pain: Secondary | ICD-10-CM | POA: Diagnosis not present

## 2018-11-10 DIAGNOSIS — Z23 Encounter for immunization: Secondary | ICD-10-CM | POA: Diagnosis not present

## 2018-11-10 DIAGNOSIS — E119 Type 2 diabetes mellitus without complications: Secondary | ICD-10-CM | POA: Diagnosis not present

## 2018-11-10 DIAGNOSIS — M25571 Pain in right ankle and joints of right foot: Secondary | ICD-10-CM | POA: Diagnosis not present

## 2018-11-10 DIAGNOSIS — M6281 Muscle weakness (generalized): Secondary | ICD-10-CM | POA: Diagnosis not present

## 2018-11-10 DIAGNOSIS — M109 Gout, unspecified: Secondary | ICD-10-CM | POA: Diagnosis not present

## 2018-11-10 DIAGNOSIS — M2550 Pain in unspecified joint: Secondary | ICD-10-CM | POA: Diagnosis not present

## 2018-11-10 DIAGNOSIS — R5381 Other malaise: Secondary | ICD-10-CM | POA: Diagnosis not present

## 2018-11-10 DIAGNOSIS — R262 Difficulty in walking, not elsewhere classified: Secondary | ICD-10-CM | POA: Diagnosis not present

## 2018-11-10 DIAGNOSIS — M10071 Idiopathic gout, right ankle and foot: Secondary | ICD-10-CM | POA: Diagnosis not present

## 2018-11-10 DIAGNOSIS — R52 Pain, unspecified: Secondary | ICD-10-CM | POA: Diagnosis not present

## 2018-11-10 DIAGNOSIS — I10 Essential (primary) hypertension: Secondary | ICD-10-CM | POA: Diagnosis not present

## 2018-11-10 DIAGNOSIS — R600 Localized edema: Secondary | ICD-10-CM | POA: Diagnosis not present

## 2018-11-10 DIAGNOSIS — R2689 Other abnormalities of gait and mobility: Secondary | ICD-10-CM | POA: Diagnosis not present

## 2018-11-10 DIAGNOSIS — R197 Diarrhea, unspecified: Secondary | ICD-10-CM | POA: Diagnosis not present

## 2018-11-10 DIAGNOSIS — N182 Chronic kidney disease, stage 2 (mild): Secondary | ICD-10-CM | POA: Diagnosis not present

## 2018-11-10 LAB — RENAL FUNCTION PANEL
Albumin: 2.2 g/dL — ABNORMAL LOW (ref 3.5–5.0)
Anion gap: 8 (ref 5–15)
BUN: 39 mg/dL — ABNORMAL HIGH (ref 8–23)
CO2: 22 mmol/L (ref 22–32)
Calcium: 8.2 mg/dL — ABNORMAL LOW (ref 8.9–10.3)
Chloride: 106 mmol/L (ref 98–111)
Creatinine, Ser: 1.3 mg/dL — ABNORMAL HIGH (ref 0.61–1.24)
GFR calc Af Amer: >60 mL/min (ref >60)
GFR calc non Af Amer: 56 mL/min — ABNORMAL LOW (ref >60)
Glucose, Bld: 283 mg/dL — ABNORMAL HIGH (ref 70–99)
Phosphorus: 4.1 mg/dL (ref 2.5–4.6)
Potassium: 4 mmol/L (ref 3.5–5.1)
Sodium: 136 mmol/L (ref 135–145)

## 2018-11-10 LAB — GLUCOSE, CAPILLARY
Glucose-Capillary: 263 mg/dL — ABNORMAL HIGH (ref 70–99)
Glucose-Capillary: 255 mg/dL — ABNORMAL HIGH (ref 70–99)

## 2018-11-10 MED ORDER — ASPIRIN 81 MG PO CHEW: 81.0000 mg | CHEWABLE_TABLET | Freq: Every day | ORAL | 0 refills | Status: AC

## 2018-11-10 MED ORDER — ALLOPURINOL 100 MG PO TABS: 100.0000 mg | ORAL_TABLET | Freq: Every day | ORAL | 0 refills | Status: DC

## 2018-11-10 MED ORDER — PREDNISONE 10 MG PO TABS: ORAL_TABLET | ORAL | 0 refills | Status: DC

## 2018-11-10 MED ORDER — COLCHICINE 0.6 MG PO TABS: 0.6000 mg | ORAL_TABLET | Freq: Every day | ORAL | 1 refills | Status: DC

## 2018-11-10 NOTE — Discharge Summary (Signed)
Physician Discharge Summary  Jerry York IDP:[id number] DOB: 1951-09-12 DOA: 11/03/2018  PCP: [name], MD  Admit date: 11/03/2018 Discharge date: 11/10/2018  Admitted From: Home Disposition:  SNF  Recommendations for Outpatient Follow-up:  1. Follow up with PCP in 1-2 weeks 2. Please obtain BMP/CBC in one week 3. Please follow up with rheumatologist as an outpatient  Home Health: No Equipment/Devices:Rolling walker with 5" wheels;Wheelchair (measurements PT);Wheelchair cushion   Discharge Condition: stable CODE STATUS: FULL Diet recommendation: Carb Modified    Brief/Interim Summary:  #) Acute bilateral ankle gout: Patient was admitted with severe bilateral ankle swelling and pain.  He did have an arthrocentesis on 11/05/2018 that showed gout.  He also received steroid injections.  He was started on IV methylprednisolone, allopurinol.  He received a colchicine load and was discharged on oral colchicine.  Patient was evaluated by physical therapy recommended skilled nursing facility.  Patient was given outpatient follow-up with rheumatology.  #) Hypertension/hyperlipidemia: Patient was continued on amlodipine, atorvastatin, metoprolol.  #) type 2 diabetes on insulin: Patient was maintained on glargine and aspart here.  He may continue his home regular insulin with meals.  #) Stage II CKD: This was stable during his hospitalization.  Discharge Diagnoses:  Active Problems:   Hyperlipidemia associated with type 2 diabetes mellitus (HCC)   Hypertension associated with diabetes ([place])   Type 2 diabetes mellitus ([place])   History of BPH   Gout flare   Elevated lactic acid level   Foot pain, right   Unsteady gait    Discharge Instructions  Discharge Instructions    Call MD for:  difficulty breathing, headache or visual disturbances   Complete by:  As directed    Call MD for:  hives   Complete by:  As directed    Call MD for:  persistant nausea and vomiting    Complete by:  As directed    Call MD for:  redness, tenderness, or signs of infection (pain, swelling, redness, odor or green/yellow discharge around incision site)   Complete by:  As directed    Call MD for:  severe uncontrolled pain   Complete by:  As directed    Call MD for:  temperature >100.4   Complete by:  As directed    Diet - low sodium heart healthy   Complete by:  As directed    Discharge instructions   Complete by:  As directed    Please follow-up with your primary care doctor in 1 week.  Please follow-up with your rheumatologist when you are called with the appointment.   Increase activity slowly   Complete by:  As directed      Allergies as of 11/10/2018      Reactions   Prednisone Nausea And Vomiting   Elevated blood sugar   Metformin And Related Diarrhea   Sulfa Antibiotics Other (See Comments)   "make my feet burn"      Medication List    STOP taking these medications   doxycycline 100 MG capsule Commonly known as:  VIBRAMYCIN   niacin 500 MG tablet   omega-3 acid ethyl esters 1 g capsule Commonly known as:  LOVAZA     TAKE these medications   acetaminophen 325 MG tablet Commonly known as:  TYLENOL Take 650 mg by mouth every 6 (six) hours as needed for moderate pain.   allopurinol 100 MG tablet Commonly known as:  ZYLOPRIM Take 1 tablet (100 mg total) by mouth daily. Start taking on:  11/11/2018   amLODipine 5 MG tablet Commonly known as:  NORVASC TAKE 1 TABLET BY MOUTH ONCE DAILY   aspirin 81 MG chewable tablet Chew 1 tablet (81 mg total) by mouth daily for 7 days.   atorvastatin 20 MG tablet Commonly known as:  LIPITOR TAKE ONE TABLET BY MOUTH EVERY EVENING What changed:  when to take this   [name] Take 5,000 mg by mouth 2 (two) times daily.   clonazePAM 0.5 MG tablet Commonly known as:  KLONOPIN Take 1 tablet (0.5 mg total) by mouth 2 (two) times daily.   colchicine 0.6 MG tablet Take 1 tablet (0.6 mg total) by mouth  daily. Start taking on:  11/11/2018   [name] 14 DAY READER Devi USE AS DIRECTED THREE TIMES DAILY   [name] 14 DAY SENSOR Misc 1 Units by Does not apply route every 14 (fourteen) days.   furosemide 80 MG tablet Commonly known as:  LASIX Take 1 tablet (80 mg total) by mouth 2 (two) times daily.   glucose blood test strip Check blood sugars 2-3 times per day. DX: E11.9   glucose monitoring kit monitoring kit 1 each by Does not apply route as needed for other. DX Code: E11.9   HYDROcodone-acetaminophen 5-325 MG tablet Commonly known as:  NORCO/VICODIN Take 1 tablet by mouth every 6 (six) hours as needed. What changed:  reasons to take this   insulin regular 100 units/mL injection Commonly known as:  NOVOLIN R,HUMULIN R Inject 30 Units into the skin 3 (three) times daily before meals.   metoprolol tartrate 100 MG tablet Commonly known as:  LOPRESSOR Take 1 tablet (100 mg total) by mouth 2 (two) times daily.   predniSONE 10 MG tablet Commonly known as:  DELTASONE Take 4 tablets (40 mg total) by mouth daily with breakfast for 3 days, THEN 3 tablets (30 mg total) daily with breakfast for 5 days, THEN 2 tablets (20 mg total) daily with breakfast for 5 days, THEN 1 tablet (10 mg total) daily with breakfast for 5 days, THEN 0.5 tablets (5 mg total) daily with breakfast for 5 days. Start taking on:  11/11/2018            Durable Medical Equipment  (From admission, onward)         Start     Ordered   11/10/18 1052  For home use only DME standard manual wheelchair with seat cushion  (Wheelchairs)  Once    Comments:  Patient suffers from bilateral ankle gout which impairs their ability to perform daily activities like bathing and dressing in the home.  A cane or crutch will not resolve  issue with performing activities of daily living. A wheelchair will allow patient to safely perform daily activities. Patient can safely propel the wheelchair in the home or has a  caregiver who can provide assistance.  Accessories: elevating leg rests (ELRs), wheel locks, extensions and anti-tippers.   11/10/18 1052   11/10/18 1052  For home use only DME Walker rolling  [employer])  Once    Question:  Patient needs a walker to treat with the following condition  Answer:  Gout   11/10/18 1052   11/06/18 0839  For home use only DME Walker rolling  Once    Question:  Patient needs a walker to treat with the following condition  Answer:  Gout   11/06/18 0839   11/06/18 0839  For home use only DME 3 n 1  Once  11/06/18 0839         Contact information for after-discharge care    Destination    [facility] Preferred SNF .   Service:  Skilled Nursing Contact information: [address] [phone]             Allergies  Allergen Reactions  . Prednisone Nausea And Vomiting    Elevated blood sugar  . Metformin And Related Diarrhea  . Sulfa Antibiotics Other (See Comments)    "make my feet burn"    Consultations:  Orthopedic surgery   Procedures/Studies: Dg Chest 2 View  Result Date: 11/02/2018 CLINICAL DATA:  Status post fall.  Hypertension and diabetes. EXAM: CHEST - 2 VIEW COMPARISON:  August 08, 2017 FINDINGS: The heart size and mediastinal contours are stable. The heart size is probably enlarged. There is chronic elevation of right hemidiaphragm unchanged. Both lungs are clear. The visualized skeletal structures are unremarkable. IMPRESSION: No active cardiopulmonary disease. Electronically Signed   By: Abelardo Diesel M.D.   On: 11/02/2018 15:56   Dg Tibia/fibula Right  Result Date: 11/02/2018 CLINICAL DATA:  Status post fall with right lower leg pain. EXAM: RIGHT TIBIA AND FIBULA - 2 VIEW COMPARISON:  None. FINDINGS: There is no evidence of fracture or dislocation. Degenerative joint changes of the right knee and right ankle are identified. Soft tissues are unremarkable. IMPRESSION: No acute fracture or  dislocation. Electronically Signed   By: Abelardo Diesel M.D.   On: 11/02/2018 15:57   Dg Chest Port 1 View  Result Date: 11/05/2018 CLINICAL DATA:  67 year old male with episode of shortness of breath. Subsequent encounter. EXAM: PORTABLE CHEST 1 VIEW COMPARISON:  11/02/2018 and 08/08/2017 chest x-ray. FINDINGS: Chronically elevated right hemidiaphragm. Bibasilar subsegmental atelectasis. No segmental consolidation or pulmonary edema. No plain film evidence of pulmonary malignancy. Mild cardiomegaly. Calcified slightly tortuous aorta. No pneumothorax noted. Degenerative changes shoulder joints. IMPRESSION: 1. Chronically elevated right hemidiaphragm (limits evaluation right lung base). 2. Bibasilar subsegmental atelectasis. 3. No segmental consolidation or pulmonary edema. 4. Mild cardiomegaly. 5.  Aortic Atherosclerosis (ICD10-I70.0). Electronically Signed   By: Genia Del M.D.   On: 11/05/2018 11:07   Dg Hand Complete Right  Result Date: 11/02/2018 CLINICAL DATA:  Dorsal hand skin tear after fall. EXAM: RIGHT HAND - COMPLETE 3+ VIEW COMPARISON:  None. FINDINGS: No acute fracture or dislocation. Juxtacortical erosive changes involving the ulnar aspect of the second and third proximal phalanx heads. Asymmetric mild ulnar joint space narrowing of the second and third PIP joints and third MCP joint. Bone mineralization is normal. Mild dorsal hand soft tissue irregularity. 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 IMPRESSION: 1. Mild dorsal soft tissue hand irregularity, corresponding to known skin tear. No acute osseous abnormality. 2. Chronic appearing Juxtacortical erosive changes involving the ulnar aspect of the second and third PIP joints, consistent with gout. Electronically Signed   By: Titus Dubin M.D.   On: 11/02/2018 12:13   Dg Foot Complete Left  Result Date: 11/02/2018 CLINICAL DATA:  Patient fell off of a knee roller. EXAM: LEFT  FOOT - COMPLETE 3+ VIEW COMPARISON:  01/01/2018 FINDINGS: There is no evidence of fracture or dislocation. Stable findings of arthropathy at the tarsometatarsal joints. No significant soft tissue swelling. Vascular calcifications noted. Soft tissues are unremarkable. IMPRESSION: No acute fracture or dislocation identified about the left foot. Electronically Signed   By: Fidela Salisbury M.D.   On: 11/02/2018  12:13   Dg Foot Complete Right  Result Date: 11/02/2018 CLINICAL DATA:  Patient fell off of a knee roller this morning. EXAM: RIGHT FOOT COMPLETE - 3+ VIEW COMPARISON:  None. FINDINGS: There is no evidence of fracture or dislocation. Arthritic changes at the subtalar and tarso-metatarsal joints. Midfoot soft tissue swelling. Vascular calcifications noted. IMPRESSION: No acute fracture or dislocation identified about the right foot. Electronically Signed   By: Fidela Salisbury M.D.   On: 11/02/2018 12:15   Vas [name] Lower Extremity Venous (dvt) (only [facility])  Result Date: 11/02/2018  Lower Venous Study Indications: Pain.  Performing Technologist: Oliver Hum RVT  Examination Guidelines: A complete evaluation includes B-mode imaging, spectral Doppler, color Doppler, and power Doppler as needed of all accessible portions of each vessel. Bilateral testing is considered an integral part of a complete examination. Limited examinations for reoccurring indications may be performed as noted.  Right Venous Findings: +---------+---------------+---------+-----------+----------+-----------------+          CompressibilityPhasicitySpontaneityPropertiesSummary           +---------+---------------+---------+-----------+----------+-----------------+ CFV      Full           Yes      Yes                                    +---------+---------------+---------+-----------+----------+-----------------+ SFJ      Full                                                            +---------+---------------+---------+-----------+----------+-----------------+ FV Prox  Full                                                           +---------+---------------+---------+-----------+----------+-----------------+ FV Mid   Full                                                           +---------+---------------+---------+-----------+----------+-----------------+ FV DistalFull                                                           +---------+---------------+---------+-----------+----------+-----------------+ PFV      Full                                                           +---------+---------------+---------+-----------+----------+-----------------+ POP      Full           Yes      Yes                                    +---------+---------------+---------+-----------+----------+-----------------+  PTV      Full                                                           +---------+---------------+---------+-----------+----------+-----------------+ PERO     Full                                                           +---------+---------------+---------+-----------+----------+-----------------+ SSV      None                                         Age Indeterminate +---------+---------------+---------+-----------+----------+-----------------+  Left Venous Findings: +---+---------------+---------+-----------+----------+-------+    CompressibilityPhasicitySpontaneityPropertiesSummary +---+---------------+---------+-----------+----------+-------+ CFVFull           Yes      Yes                          +---+---------------+---------+-----------+----------+-------+    Summary: Right: Findings consistent with age indeterminate superficial vein thrombosis involving the right small saphenous vein. There is no evidence of deep vein thrombosis in the lower extremity. A cystic structure measuring 2 cm high by 4 cm wide by greater than  4.7 cm long is found in the popliteal fossa.  *See table(s) above for measurements and observations. Electronically signed by Monica Martinez MD on 11/02/2018 at 1:59:43 PM.    Final       11/05/2018 bilateral ankle arthrocentesis and steroid injection  Subjective:   Discharge Exam: Vitals:   11/09/18 2116 11/10/18 0453  BP: (!) 152/71 (!) 153/78  Pulse: 62 (!) 57  Resp: 16 16  Temp: 98.2 F (36.8 C) 97.8 F (36.6 C)  SpO2: 97% 96%   Vitals:   11/09/18 0531 11/09/18 1321 11/09/18 2116 11/10/18 0453  BP: (!) 143/82 (!) 142/73 (!) 152/71 (!) 153/78  Pulse: (!) 48 (!) 51 62 (!) 57  Resp: _0 Temp: (!) 97.4 F (36.3 C) (!) 97.5 F (36.4 C) 98.2 F (36.8 C) 97.8 F (36.6 C)  TempSrc: Oral Axillary Oral Oral  SpO2: 96% 98% 97% 96%  Weight:      Height:        General exam: Appears calm and comfortable  Respiratory system: Clear to auscultation. Respiratory effort normal. Cardiovascular system: Regular rate and rhythm, no murmurs. Gastrointestinal system: Abdomen is nondistended, soft and nontender. No organomegaly or masses felt. Normal bowel sounds heard. Central nervous system: Alert and oriented.  Grossly intact, moving all extremities Extremities: Bilateral ankle swollen, pain with eversion, plantarflexion and dorsiflexion, inversion, intact distal pulses. Skin: No rashes over visible skin Psychiatry: Judgement and insight appear normal. Mood & affect appropriate  The results of significant diagnostics from this hospitalization (including imaging, microbiology, ancillary and laboratory) are listed below for reference.     Microbiology: Recent Results (from the past 240 hour(s))  Culture, blood (routine x 2)     Status: None   Collection Time: 11/02/18  1:59 PM  Result Value Ref Range Status   Specimen Description   Final  BLOOD LEFT ANTECUBITAL Performed at [facility] [address], [place], [place] [address]    Special  Requests   Final    BOTTLES DRAWN AEROBIC AND ANAEROBIC Blood Culture results may not be optimal due to an excessive volume of blood received in culture bottles Performed at [facility] [address] [place], [place] [address]    Culture   Final    NO GROWTH 5 DAYS Performed at [facility], [place] [address] [place] [address], [place], [place] [address]    Report Status 11/07/2018 FINAL  Final  Culture, blood (routine x 2)     Status: None   Collection Time: 11/02/18  2:17 PM  Result Value Ref Range Status   Specimen Description   Final    BLOOD LEFT ANTECUBITAL Performed at [facility] [address], [place], [place] [address]    Special Requests   Final    BOTTLES DRAWN AEROBIC AND ANAEROBIC Blood Culture adequate volume Performed at [facility] [address], [place], [place] [address]    Culture   Final    NO GROWTH 5 DAYS Performed at [facility], [place] [address] [place], [place] [address]    Report Status 11/07/2018 FINAL  Final  Urine culture     Status: None   Collection Time: 11/02/18  8:01 PM  Result Value Ref Range Status   Specimen Description   Final    URINE, RANDOM Performed at [facility] [address], [place], [place] [address]    Special Requests   Final    NONE Performed at [facility], [place] [address], [place], [place] [address]    Culture   Final    NO GROWTH Performed at [facility], [place] [address], [place], [place] [address]    Report Status 11/04/2018 FINAL  Final     Labs: BNP (last 3 results) No results for input(s): BNP in the last 8760 hours. Basic Metabolic Panel: Recent Labs  Lab 11/06/18 0143 11/07/18 0130 11/08/18 0243 11/09/18 0300 11/10/18 0534  NA 133* 134* 133* 136 136  K 4.1 4.4 4.3 4.7 4.0  CL 104 108 106 108 106  CO2 21* 19* 18* 21* 22  GLUCOSE 334* 293* 314* 284* 283*  BUN 54* 53* 53* 43* 39*  CREATININE 1.61*  1.39* 1.37* 1.52* 1.30*  CALCIUM 8.1* 8.0* 8.0* 8.3* 8.2*  MG  --   --  2.4  --   --   PHOS  --   --   --   --  4.1   Liver Function Tests: Recent Labs  Lab 11/04/18 0413 11/10/18 0534  AST 23  --   ALT 23  --   ALKPHOS 36*  --   BILITOT 1.1  --   PROT 6.3*  --   ALBUMIN 2.3* 2.2*   No results for input(s): LIPASE, AMYLASE in the last 168 hours. No results for input(s): AMMONIA in the last 168 hours. CBC: Recent Labs  Lab 11/03/18 1407 11/03/18 1806 11/04/18 0413 11/08/18 0243 11/09/18 0300  WBC 9.5 9.3 7.9 8.8 9.7  NEUTROABS 7.7  --   --   --   --   HGB 12.3* 11.8* 11.3* 11.2* 11.5*  HCT 39.0 37.0* 34.0* 34.2* 36.4*  MCV 92.6 91.4 91.9 91.4 91.9  PLT 173 161 143* 250 300   Cardiac Enzymes: No results for input(s): CKTOTAL, CKMB, CKMBINDEX, TROPONINI in the  last 168 hours. BNP: Invalid input(s): POCBNP CBG: Recent Labs  Lab 11/09/18 1221 11/09/18 1737 11/09/18 2205 11/10/18 0847 11/10/18 1209  GLUCAP 163* 234* 288* 263* 255*   D-Dimer No results for input(s): DDIMER in the last 72 hours. Hgb A1c No results for input(s): HGBA1C in the last 72 hours. Lipid Profile No results for input(s): CHOL, HDL, LDLCALC, TRIG, CHOLHDL, LDLDIRECT in the last 72 hours. Thyroid function studies No results for input(s): TSH, T4TOTAL, T3FREE, THYROIDAB in the last 72 hours.  Invalid input(s): FREET3 Anemia work up No results for input(s): VITAMINB12, FOLATE, FERRITIN, TIBC, IRON, RETICCTPCT in the last 72 hours. Urinalysis    Component Value Date/Time   COLORURINE YELLOW 11/02/2018 2001   APPEARANCEUR CLEAR 11/02/2018 2001   LABSPEC 1.016 11/02/2018 2001   PHURINE 5.0 11/02/2018 2001   GLUCOSEU NEGATIVE 11/02/2018 2001   HGBUR MODERATE (A) 11/02/2018 2001   BILIRUBINUR NEGATIVE 11/02/2018 2001   BILIRUBINUR negative 12/12/2016 1224   KETONESUR 5 (A) 11/02/2018 2001   PROTEINUR 30 (A) 11/02/2018 2001   UROBILINOGEN 0.2 12/12/2016 1224   NITRITE NEGATIVE 11/02/2018  2001   LEUKOCYTESUR NEGATIVE 11/02/2018 2001   Sepsis Labs Invalid input(s): PROCALCITONIN,  WBC,  LACTICIDVEN Microbiology Recent Results (from the past 240 hour(s))  Culture, blood (routine x 2)     Status: None   Collection Time: 11/02/18  1:59 PM  Result Value Ref Range Status   Specimen Description   Final    BLOOD LEFT ANTECUBITAL Performed at [facility], [place] [address], [place], [place] [address]    Special Requests   Final    BOTTLES DRAWN AEROBIC AND ANAEROBIC Blood Culture results may not be optimal due to an excessive volume of blood received in culture bottles Performed at [facility] [address] [place], [place] [address]    Culture   Final    NO GROWTH 5 DAYS Performed at [facility], [place] [address], [place], [place] [address]    Report Status 11/07/2018 FINAL  Final  Culture, blood (routine x 2)     Status: None   Collection Time: 11/02/18  2:17 PM  Result Value Ref Range Status   Specimen Description   Final    BLOOD LEFT ANTECUBITAL Performed at [facility] [address], [place], [place] [address]    Special Requests   Final    BOTTLES DRAWN AEROBIC AND ANAEROBIC Blood Culture adequate volume Performed at [facility] [address] [place], [place] [address]    Culture   Final    NO GROWTH 5 DAYS Performed at [facility], [place] [address] [place], [place] [address]    Report Status 11/07/2018 FINAL  Final  Urine culture     Status: None   Collection Time: 11/02/18  8:01 PM  Result Value Ref Range Status   Specimen Description   Final    URINE, RANDOM Performed at [facility] [address] [place], [place] [address]    Special Requests   Final    NONE Performed at [facility], [place] [address], [place], [place] [address]    Culture   Final    NO GROWTH Performed at [facility], [place] [address],  [place], [place] [address]    Report Status 11/04/2018 FINAL  Final     Time coordinating discharge: 73  SIGNED:   Cristy Folks, MD  [facility]  11/10/2018, 1:52 PM   If 7PM-7AM, please contact night-coverage [email or web address] Password [email or web address]

## 2018-11-10 NOTE — Progress Notes (Signed)
Discharged to [facility] facility via ambulance. Report given to Malachi Paradise, RN. Personal belongings, discharged instructions given to patient.

## 2018-11-10 NOTE — Discharge Instructions (Signed)

## 2018-11-10 NOTE — Progress Notes (Signed)
Patient will DC to: [facility] Anticipated DC date: 11/10/18 Family notified: [name] Transport by: Sharin Mons  Per MD patient ready for DC to [facility] . RN, patient, patient's family, and facility notified of DC. Discharge Summary sent to facility. RN given number for report [phone]. DC packet on chart. Ambulance transport requested for patient.  CSW signing off.  [place], [place] [phone]

## 2018-11-12 ENCOUNTER — Other Ambulatory Visit: Payer: Self-pay | Admitting: *Deleted

## 2018-11-12 DIAGNOSIS — E119 Type 2 diabetes mellitus without complications: Secondary | ICD-10-CM | POA: Diagnosis not present

## 2018-11-12 DIAGNOSIS — G8929 Other chronic pain: Secondary | ICD-10-CM | POA: Diagnosis not present

## 2018-11-12 DIAGNOSIS — F419 Anxiety disorder, unspecified: Secondary | ICD-10-CM | POA: Diagnosis not present

## 2018-11-12 DIAGNOSIS — N182 Chronic kidney disease, stage 2 (mild): Secondary | ICD-10-CM | POA: Diagnosis not present

## 2018-11-12 DIAGNOSIS — I10 Essential (primary) hypertension: Secondary | ICD-10-CM | POA: Diagnosis not present

## 2018-11-12 DIAGNOSIS — M6281 Muscle weakness (generalized): Secondary | ICD-10-CM | POA: Diagnosis not present

## 2018-11-12 DIAGNOSIS — M109 Gout, unspecified: Secondary | ICD-10-CM | POA: Diagnosis not present

## 2018-11-12 DIAGNOSIS — E785 Hyperlipidemia, unspecified: Secondary | ICD-10-CM | POA: Diagnosis not present

## 2018-11-12 NOTE — Patient Outreach (Signed)
[facility] Care Management follow up.  Chart reviewed. Noted Mr. Haaland discharged to [facility] on 11/10/18. Will make referral to [facility] LCSW for follow up.   Raiford Noble, MSN-Ed, RN,BSN [facility] Liaison [phone]

## 2018-11-13 ENCOUNTER — Other Ambulatory Visit: Payer: Self-pay | Admitting: Licensed Clinical Social Worker

## 2018-11-13 DIAGNOSIS — M25571 Pain in right ankle and joints of right foot: Secondary | ICD-10-CM | POA: Diagnosis not present

## 2018-11-13 DIAGNOSIS — I10 Essential (primary) hypertension: Secondary | ICD-10-CM | POA: Diagnosis not present

## 2018-11-13 DIAGNOSIS — M6281 Muscle weakness (generalized): Secondary | ICD-10-CM | POA: Diagnosis not present

## 2018-11-13 DIAGNOSIS — E119 Type 2 diabetes mellitus without complications: Secondary | ICD-10-CM | POA: Diagnosis not present

## 2018-11-13 DIAGNOSIS — M109 Gout, unspecified: Secondary | ICD-10-CM | POA: Diagnosis not present

## 2018-11-13 DIAGNOSIS — N182 Chronic kidney disease, stage 2 (mild): Secondary | ICD-10-CM | POA: Diagnosis not present

## 2018-11-13 DIAGNOSIS — F419 Anxiety disorder, unspecified: Secondary | ICD-10-CM | POA: Diagnosis not present

## 2018-11-13 DIAGNOSIS — R2689 Other abnormalities of gait and mobility: Secondary | ICD-10-CM | POA: Diagnosis not present

## 2018-11-13 DIAGNOSIS — E785 Hyperlipidemia, unspecified: Secondary | ICD-10-CM | POA: Diagnosis not present

## 2018-11-13 NOTE — Patient Outreach (Signed)
[facility]) Care Management  [facility] Social Work  11/13/2018  Jerry York 05/03/1951 [id number]  Encounter Medications:  Outpatient Encounter Medications as of 11/13/2018  Medication Sig  . acetaminophen (TYLENOL) 325 MG tablet Take 650 mg by mouth every 6 (six) hours as needed for moderate pain.  [name] allopurinol (ZYLOPRIM) 100 MG tablet Take 1 tablet (100 mg total) by mouth daily.  [name] amLODipine (NORVASC) 5 MG tablet TAKE 1 TABLET BY MOUTH ONCE DAILY (Patient taking differently: Take 5 mg by mouth daily. )  . aspirin 81 MG chewable tablet Chew 1 tablet (81 mg total) by mouth daily for 7 days.  [name] atorvastatin (LIPITOR) 20 MG tablet TAKE ONE TABLET BY MOUTH EVERY EVENING (Patient taking differently: Take 20 mg by mouth. )  . [name] Take 5,000 mg by mouth 2 (two) times daily.   . clonazePAM (KLONOPIN) 0.5 MG tablet Take 1 tablet (0.5 mg total) by mouth 2 (two) times daily.  . colchicine 0.6 MG tablet Take 1 tablet (0.6 mg total) by mouth daily.  . Continuous Blood Gluc Receiver ([name] 14 DAY READER) DEVI USE AS DIRECTED THREE TIMES DAILY  . Continuous Blood Gluc Sensor ([name] 14 DAY SENSOR) MISC 1 Units by Does not apply route every 14 (fourteen) days.  . furosemide (LASIX) 80 MG tablet Take 1 tablet (80 mg total) by mouth 2 (two) times daily.  [name] glucose blood ([name] TEST STRIPS) test strip Check blood sugars 2-3 times per day. DX: E11.9  . glucose monitoring kit ([name]) monitoring kit 1 each by Does not apply route as needed for other. DX Code: E11.9  . HYDROcodone-acetaminophen (NORCO/VICODIN) 5-325 MG tablet Take 1 tablet by mouth every 6 (six) hours as needed. (Patient taking differently: Take 1 tablet by mouth every 6 (six) hours as needed for moderate pain or severe pain. )  . insulin regular (NOVOLIN R,HUMULIN R) 100 units/mL injection Inject 30 Units into the skin 3 (three) times daily before meals.  . metoprolol tartrate (LOPRESSOR)  100 MG tablet Take 1 tablet (100 mg total) by mouth 2 (two) times daily.  . predniSONE (DELTASONE) 10 MG tablet Take 4 tablets (40 mg total) by mouth daily with breakfast for 3 days, THEN 3 tablets (30 mg total) daily with breakfast for 5 days, THEN 2 tablets (20 mg total) daily with breakfast for 5 days, THEN 1 tablet (10 mg total) daily with breakfast for 5 days, THEN 0.5 tablets (5 mg total) daily with breakfast for 5 days.   No facility-administered encounter medications on file as of 11/13/2018.     Functional Status:  In your present state of health, do you have any difficulty performing the following activities: 11/04/2018 11/02/2018  Hearing? N N  Vision? N N  Difficulty concentrating or making decisions? N N  Walking or climbing stairs? Y Y  Comment - -  Dressing or bathing? Y Y  Doing errands, shopping? [employer] and eating ? - -  Comment - -  Using the Toilet? - -  In the past six months, have you accidently leaked urine? - -  Do you have problems with loss of bowel control? - -  Managing your Medications? - -  Managing your Finances? - -  Housekeeping or managing your Housekeeping? - -  Comment - -  Some recent data might be hidden    Fall/Depression Screening:  PHQ 2/9 Scores 10/22/2018 02/23/2018 01/26/2018 11/10/2017 08/21/2017 08/10/2017  12/12/2016  PHQ - 2 Score 0 0 0 0 0 0 0    Assessment: [facility] CSW arrived at [facility] and successfully completed [facility] after receiving new referral on 11/12/18. HIPPA verifications successfully received. [facility] CSW introduced self, reason for visit and of [facility] CM Services. Patient agreeable to services and has an active consent on file. Patient reports being at facility since 11/11/18. Patient denies wanting LTC at facility and plans on discharging back home with his spouse once it is time. Patient reports having gout in both feet which led to his most recent hospitalization. He reports that he is unable to bear  any weight at this time due to the gout pain he is experiencing in both of his feet.  Patient wishes to be followed by [facility] Pharmacist and RNCM post SNF discharge. Patient reports having a few costly medications that he would like to see if he could qualify for medication assistance for. Patient reports having stable transportation through his spouse. Patient reports hoping to get a walker with 5 inch heels and a wheelchair ordered for him post SNF discharge. [facility] CSW will monitor [name] closely and will awiat for SNF discharge.   Plan: [facility] CSW will follow up within two weeks.  Eula Fried, BSW, MSW, [name] Phone: [phone] Fax: [phone]

## 2018-11-14 DIAGNOSIS — N182 Chronic kidney disease, stage 2 (mild): Secondary | ICD-10-CM | POA: Diagnosis not present

## 2018-11-14 DIAGNOSIS — I10 Essential (primary) hypertension: Secondary | ICD-10-CM | POA: Diagnosis not present

## 2018-11-14 DIAGNOSIS — G8929 Other chronic pain: Secondary | ICD-10-CM | POA: Diagnosis not present

## 2018-11-14 DIAGNOSIS — R600 Localized edema: Secondary | ICD-10-CM | POA: Diagnosis not present

## 2018-11-14 DIAGNOSIS — E119 Type 2 diabetes mellitus without complications: Secondary | ICD-10-CM | POA: Diagnosis not present

## 2018-11-14 DIAGNOSIS — F419 Anxiety disorder, unspecified: Secondary | ICD-10-CM | POA: Diagnosis not present

## 2018-11-14 DIAGNOSIS — M109 Gout, unspecified: Secondary | ICD-10-CM | POA: Diagnosis not present

## 2018-11-14 DIAGNOSIS — E785 Hyperlipidemia, unspecified: Secondary | ICD-10-CM | POA: Diagnosis not present

## 2018-11-14 DIAGNOSIS — M6281 Muscle weakness (generalized): Secondary | ICD-10-CM | POA: Diagnosis not present

## 2018-11-14 DIAGNOSIS — B370 Candidal stomatitis: Secondary | ICD-10-CM | POA: Diagnosis not present

## 2018-11-16 ENCOUNTER — Other Ambulatory Visit: Payer: Self-pay | Admitting: Licensed Clinical Social Worker

## 2018-11-16 DIAGNOSIS — M25571 Pain in right ankle and joints of right foot: Secondary | ICD-10-CM | POA: Diagnosis not present

## 2018-11-16 DIAGNOSIS — E785 Hyperlipidemia, unspecified: Secondary | ICD-10-CM | POA: Diagnosis not present

## 2018-11-16 DIAGNOSIS — R197 Diarrhea, unspecified: Secondary | ICD-10-CM | POA: Diagnosis not present

## 2018-11-16 DIAGNOSIS — F419 Anxiety disorder, unspecified: Secondary | ICD-10-CM | POA: Diagnosis not present

## 2018-11-16 DIAGNOSIS — N182 Chronic kidney disease, stage 2 (mild): Secondary | ICD-10-CM | POA: Diagnosis not present

## 2018-11-16 DIAGNOSIS — M6281 Muscle weakness (generalized): Secondary | ICD-10-CM | POA: Diagnosis not present

## 2018-11-16 DIAGNOSIS — I10 Essential (primary) hypertension: Secondary | ICD-10-CM | POA: Diagnosis not present

## 2018-11-16 DIAGNOSIS — E119 Type 2 diabetes mellitus without complications: Secondary | ICD-10-CM | POA: Diagnosis not present

## 2018-11-16 DIAGNOSIS — M109 Gout, unspecified: Secondary | ICD-10-CM | POA: Diagnosis not present

## 2018-11-16 DIAGNOSIS — R2689 Other abnormalities of gait and mobility: Secondary | ICD-10-CM | POA: Diagnosis not present

## 2018-11-16 NOTE — Patient Outreach (Signed)
[facility]) Care Management  11/16/2018  Jerry York 1951-11-03 [id number]  [facility] CSW sent care coordination email to [facility] social worker [place] notifying her that [facility] CSW is following patient during his short term stay at facility and can provide support/assistnace/coordination as needed.   Dickie La, BSW, MSW, LCSW [facility] [email or web address] Phone: [phone] Fax: [phone]

## 2018-11-19 ENCOUNTER — Other Ambulatory Visit: Payer: Self-pay | Admitting: Family Medicine

## 2018-11-19 DIAGNOSIS — I10 Essential (primary) hypertension: Secondary | ICD-10-CM | POA: Diagnosis not present

## 2018-11-19 DIAGNOSIS — F419 Anxiety disorder, unspecified: Secondary | ICD-10-CM | POA: Diagnosis not present

## 2018-11-19 DIAGNOSIS — M109 Gout, unspecified: Secondary | ICD-10-CM | POA: Diagnosis not present

## 2018-11-19 DIAGNOSIS — R2689 Other abnormalities of gait and mobility: Secondary | ICD-10-CM | POA: Diagnosis not present

## 2018-11-19 DIAGNOSIS — M6281 Muscle weakness (generalized): Secondary | ICD-10-CM | POA: Diagnosis not present

## 2018-11-19 DIAGNOSIS — E785 Hyperlipidemia, unspecified: Secondary | ICD-10-CM | POA: Diagnosis not present

## 2018-11-19 DIAGNOSIS — N182 Chronic kidney disease, stage 2 (mild): Secondary | ICD-10-CM | POA: Diagnosis not present

## 2018-11-19 DIAGNOSIS — E119 Type 2 diabetes mellitus without complications: Secondary | ICD-10-CM | POA: Diagnosis not present

## 2018-11-19 DIAGNOSIS — M25571 Pain in right ankle and joints of right foot: Secondary | ICD-10-CM | POA: Diagnosis not present

## 2018-11-20 ENCOUNTER — Other Ambulatory Visit: Payer: Self-pay

## 2018-11-20 ENCOUNTER — Other Ambulatory Visit: Payer: Self-pay | Admitting: Licensed Clinical Social Worker

## 2018-11-20 NOTE — Patient Outreach (Signed)
[facility]) Care Management  11/20/2018  Jerry York 1951/05/02 [id number]     Transition of Care Referral  Referral Date: 11/20/18 Referral Source: [facility] Dept-Urgent Date of Admission: 11/03/18 Diagnosis: "acute gout flare" Date of Hospital Discharge: 11/10/18 [facility]: [facility] Date of Discharge Home: 11/19/18 Insurance: [facility]    Outreach attempt # 1 to patient. No answer at present. RN CM left HIPAA compliant voicemail message along with contact info.    Plan: RN CM will make outreach attempt to patient within 3-4 business days. RN CM will send unsuccessful outreach letter to patient.    [name], RN,BSN,CCM [facility] Care Management Telephonic Care Management Coordinator Direct Phone: [phone] Toll Free: [phone] Fax: [phone]

## 2018-11-20 NOTE — Progress Notes (Signed)
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 male seen in consultation per request of his PCP.  According to patient last winter he had a mild flare of gout in his feet.  And after the BPH surgery in January he had severe flare in his bilateral feet.  He states he was in the hospital and was treated with colchicine.  He was also started on allopurinol.  He states since then he has been having flares every 3 to 4 weeks mostly in his feet.  He was continued on colchicine and allopurinol.  He states last flare was in August 2019 which was very severe and he had difficulty walking due to the flare.  He was hospitalized in the hospital for a week where he was given IV antibiotics, prednisone, colchicine and continued on allopurinol.  He states his symptoms have gradually improved.  Last week he had another flare for which she is was continued on the current regimen.  He has been on insulin for diabetes.  He states he does not drink any alcohol he eats mostly chicken and pork and avoids beef.  He states he ran out of colchicine about a month ago because he was in a donut hole.  He has been taking allopurinol every single day.   Activities of Daily Living:  Patient reports morning stiffness for 2 minute.   Patient Denies nocturnal pain.  Difficulty dressing/grooming: Denies Difficulty climbing stairs: Denies Difficulty getting out of chair: Denies Difficulty using hands for taps, buttons, cutlery, and/or writing: Denies  Review of Systems  Constitutional: Negative for fatigue and night sweats.  HENT: Positive for mouth dryness. Negative for mouth sores and nose dryness.   Eyes: Negative for redness and dryness.    Respiratory: Negative for shortness of breath and difficulty breathing.   Cardiovascular: Positive for swelling in legs/feet. Negative for chest pain, palpitations, hypertension and irregular heartbeat.  Gastrointestinal: Negative for constipation and diarrhea.  Endocrine: Negative for increased urination.  Musculoskeletal: Positive for arthralgias, joint pain, joint swelling and morning stiffness. Negative for myalgias, muscle weakness, muscle tenderness and myalgias.  Skin: Negative for color change, rash, hair loss, nodules/bumps, skin tightness, ulcers and sensitivity to sunlight.  Allergic/Immunologic: Negative for susceptible to infections.  Neurological: Negative for dizziness, fainting, memory loss, night sweats and weakness ( ).  Hematological: Negative for swollen glands.  Psychiatric/Behavioral: Negative for depressed mood and sleep disturbance. The patient is nervous/anxious.     PMFS History:  Patient Active Problem List   Diagnosis Date Noted  . History of anxiety 11/26/2018  . Heart murmur 11/26/2018  . Foot pain, right   . Unsteady gait   . Right knee pain 01/10/2018  . Lower extremity edema 12/11/2017  . Hypertriglyceridemia 11/27/2017  . Family history of premature CAD 11/27/2017  . 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 History of BPH 08/14/2017  . Type 2 diabetes mellitus ([place]) 08/09/2017  . Gout 08/09/2017  . Hydronephrosis, left 08/09/2017  . Anxiety 08/09/2017  . Chest pain with low  risk for cardiac etiology 08/09/2017  . Hyperlipidemia associated with type 2 diabetes mellitus ([place]) 08/08/2017  . Hypertension associated with diabetes ([place]) 08/08/2017    Past Medical History:  Diagnosis Date  . Anxiety   . Bilateral renal cysts   . Foley catheter in place   . Gout    02-22-2017 acute right foot gout---  per pt resolved  . Gross hematuria   . Heart murmur   . Hydronephrosis, left   . Hyperlipidemia   . Hypertension    . Renal insufficiency   . Type 2 diabetes mellitus ([place])   . Urinary retention   . Wears glasses     Family History  Problem Relation Age of Onset  . Congestive Heart Failure Mother        Related to valve disease.  Opted to treat medically  . Hypertension Mother   . Hyperlipidemia Mother   . Hypertension Father   . Hyperlipidemia Father   . Coronary artery disease Father 3       History of CABG  . Hypertension Brother   . Diabetes Brother   . Hypertension Daughter   . Hypertension Son   . Hypertension Brother   . Hypertension Brother    Past Surgical History:  Procedure Laterality Date  . APPENDECTOMY  age 73  . CYSTOSCOPY WITH URETEROSCOPY AND STENT PLACEMENT Left 05/08/2017   Procedure: URETEROSCOPY AND STENT PLACEMENT;  Surgeon: Kathie Rhodes, MD;  Location: [facility];  Service: Urology;  Laterality: Left;  . CYSTOSCOPY/RETROGRADE/URETEROSCOPY Bilateral 05/08/2017   Procedure: CYSTOSCOPY/ BILATERAL RETROGRADE;  Surgeon: Kathie Rhodes, MD;  Location: [facility];  Service: Urology;  Laterality: Bilateral;  . INGUINAL HERNIA REPAIR Right 10/10/2000  . KNEE ARTHROSCOPY Right 1980's  . NASAL FRACTURE SURGERY     in highschool   . SHOULDER ARTHROSCOPY WITH OPEN ROTATOR CUFF REPAIR Right 1990's  . TRANSURETHRAL RESECTION OF PROSTATE     Social History   Social History Narrative  . Not on file    Objective: Vital Signs: BP 128/79 (BP Location: Right Arm, Patient Position: Sitting, Cuff Size: Normal)   Pulse 69   Resp 15   Ht 5' 6"  (1.676 m)   Wt 176 lb 12.8 oz (80.2 kg)   BMI 28.54 kg/m    Physical Exam  Constitutional: He is oriented to person, place, and time. He appears well-developed and well-nourished.  HENT:  Head: Normocephalic and atraumatic.  Eyes: Pupils are equal, round, and reactive to light. Conjunctivae and EOM are normal.  Neck: Normal range of motion. Neck supple.  Cardiovascular: Normal rate, regular rhythm and  normal heart sounds.  Pulmonary/Chest: Effort normal and breath sounds normal.  Abdominal: Soft. Bowel sounds are normal.  Musculoskeletal: He exhibits edema.  Neurological: He is alert and oriented to person, place, and time.  Skin: Skin is warm and dry. Capillary refill takes less than 2 seconds.  Psychiatric: He has a normal mood and affect. His behavior is normal.  Nursing note and vitals reviewed.    Musculoskeletal Exam: C-spine thoracic lumbar spine good range of motion.  Shoulder joints elbow joints wrist joints were in good range of motion.  He has DIP and PIP thickening without synovitis.  Hip joints knee joints ankles were in good range of motion.  He had PIP and DIP thickening of his feet.  He had mild tenderness over bilateral first MTP joints.  CDAI Exam: CDAI Score: Not documented Patient Global Assessment: Not documented; Provider Global  Assessment: Not documented Swollen: Not documented; Tender: Not documented Joint Exam   Not documented   There is currently no information documented on the homunculus. Go to the Rheumatology activity and complete the homunculus joint exam.  Investigation: No additional findings. Component     Latest Ref Rng & Units 11/05/2018         1:37 PM  Color, Synovial     YELLOW AMBER (A)  Appearance-Synovial     CLEAR TURBID (A)  Crystals, Fluid      INTRACELLULAR MONOSODIUM URATE CRYSTALS  WBC, Synovial     0 - 200 /cu mm 4,850 (H)  Neutrophil, Synovial     0 - 25 % 95 (H)  Lymphocytes-Synovial Fld     0 - 20 % 2  Monocyte-Macrophage-Synovial Fluid     50 - 90 % 3 (L)  Other Cells-SYN      RIGHT ANKLE   Component     Latest Ref Rng & Units 11/05/2018         1:37 PM  Color, Synovial     YELLOW RED (A)  Appearance-Synovial     CLEAR TURBID (A)  Crystals, Fluid      INTRACELLULAR MONOSODIUM URATE CRYSTALS  WBC, Synovial     0 - 200 /cu mm 20,100 (H)  Neutrophil, Synovial     0 - 25 % 93 (H)  Lymphocytes-Synovial Fld      0 - 20 % 2  Monocyte-Macrophage-Synovial Fluid     50 - 90 % 5 (L)  Other Cells-SYN      LEFT ANKLE   Imaging: Dg Chest 2 View  Result Date: 11/02/2018 CLINICAL DATA:  Status post fall.  Hypertension and diabetes. EXAM: CHEST - 2 VIEW COMPARISON:  August 08, 2017 FINDINGS: The heart size and mediastinal contours are stable. The heart size is probably enlarged. There is chronic elevation of right hemidiaphragm unchanged. Both lungs are clear. The visualized skeletal structures are unremarkable. IMPRESSION: No active cardiopulmonary disease. Electronically Signed   By: Abelardo Diesel M.D.   On: 11/02/2018 15:56   Dg Tibia/fibula Right  Result Date: 11/02/2018 CLINICAL DATA:  Status post fall with right lower leg pain. EXAM: RIGHT TIBIA AND FIBULA - 2 VIEW COMPARISON:  None. FINDINGS: There is no evidence of fracture or dislocation. Degenerative joint changes of the right knee and right ankle are identified. Soft tissues are unremarkable. IMPRESSION: No acute fracture or dislocation. Electronically Signed   By: Abelardo Diesel M.D.   On: 11/02/2018 15:57   Dg Chest Port 1 View  Result Date: 11/05/2018 CLINICAL DATA:  67 year old male with episode of shortness of breath. Subsequent encounter. EXAM: PORTABLE CHEST 1 VIEW COMPARISON:  11/02/2018 and 08/08/2017 chest x-ray. FINDINGS: Chronically elevated right hemidiaphragm. Bibasilar subsegmental atelectasis. No segmental consolidation or pulmonary edema. No plain film evidence of pulmonary malignancy. Mild cardiomegaly. Calcified slightly tortuous aorta. No pneumothorax noted. Degenerative changes shoulder joints. IMPRESSION: 1. Chronically elevated right hemidiaphragm (limits evaluation right lung base). 2. Bibasilar subsegmental atelectasis. 3. No segmental consolidation or pulmonary edema. 4. Mild cardiomegaly. 5.  Aortic Atherosclerosis (ICD10-I70.0). Electronically Signed   By: Genia Del M.D.   On: 11/05/2018 11:07   Dg Hand Complete  Right  Result Date: 11/02/2018 CLINICAL DATA:  Dorsal hand skin tear after fall. EXAM: RIGHT HAND - COMPLETE 3+ VIEW COMPARISON:  None. FINDINGS: No acute fracture or dislocation. Juxtacortical erosive changes involving the ulnar aspect of the second and third proximal phalanx heads. Asymmetric mild ulnar  joint space narrowing of the second and third PIP joints and third MCP joint. Bone mineralization is normal. Mild dorsal hand soft tissue irregularity. 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 IMPRESSION: 1. Mild dorsal soft tissue hand irregularity, corresponding to known skin tear. No acute osseous abnormality. 2. Chronic appearing Juxtacortical erosive changes involving the ulnar aspect of the second and third PIP joints, consistent with gout. Electronically Signed   By: Titus Dubin M.D.   On: 11/02/2018 12:13   Dg Foot Complete Left  Result Date: 11/02/2018 CLINICAL DATA:  Patient fell off of a knee roller. EXAM: LEFT FOOT - COMPLETE 3+ VIEW COMPARISON:  01/01/2018 FINDINGS: There is no evidence of fracture or dislocation. Stable findings of arthropathy at the tarsometatarsal joints. No significant soft tissue swelling. Vascular calcifications noted. Soft tissues are unremarkable. IMPRESSION: No acute fracture or dislocation identified about the left foot. Electronically Signed   By: Fidela Salisbury M.D.   On: 11/02/2018 12:13   Dg Foot Complete Right  Result Date: 11/02/2018 CLINICAL DATA:  Patient fell off of a knee roller this morning. EXAM: RIGHT FOOT COMPLETE - 3+ VIEW COMPARISON:  None. FINDINGS: There is no evidence of fracture or dislocation. Arthritic changes at the subtalar and tarso-metatarsal joints. Midfoot soft tissue swelling. Vascular calcifications noted. IMPRESSION: No acute fracture or dislocation identified about the right foot. Electronically Signed   By: Fidela Salisbury M.D.   On: 11/02/2018 12:15   Vas [name]  Lower Extremity Venous (dvt) (only Mc & Wl)  Result Date: 11/02/2018  Lower Venous Study Indications: Pain.  Performing Technologist: Oliver Hum RVT  Examination Guidelines: A complete evaluation includes B-mode imaging, spectral Doppler, color Doppler, and power Doppler as needed of all accessible portions of each vessel. Bilateral testing is considered an integral part of a complete examination. Limited examinations for reoccurring indications may be performed as noted.  Right Venous Findings: +---------+---------------+---------+-----------+----------+-----------------+          CompressibilityPhasicitySpontaneityPropertiesSummary           +---------+---------------+---------+-----------+----------+-----------------+ CFV      Full           Yes      Yes                                    +---------+---------------+---------+-----------+----------+-----------------+ SFJ      Full                                                           +---------+---------------+---------+-----------+----------+-----------------+ FV Prox  Full                                                           +---------+---------------+---------+-----------+----------+-----------------+ FV Mid   Full                                                           +---------+---------------+---------+-----------+----------+-----------------+  FV DistalFull                                                           +---------+---------------+---------+-----------+----------+-----------------+ PFV      Full                                                           +---------+---------------+---------+-----------+----------+-----------------+ POP      Full           Yes      Yes                                    +---------+---------------+---------+-----------+----------+-----------------+ PTV      Full                                                            +---------+---------------+---------+-----------+----------+-----------------+ PERO     Full                                                           +---------+---------------+---------+-----------+----------+-----------------+ SSV      None                                         Age Indeterminate +---------+---------------+---------+-----------+----------+-----------------+  Left Venous Findings: +---+---------------+---------+-----------+----------+-------+    CompressibilityPhasicitySpontaneityPropertiesSummary +---+---------------+---------+-----------+----------+-------+ CFVFull           Yes      Yes                          +---+---------------+---------+-----------+----------+-------+    Summary: Right: Findings consistent with age indeterminate superficial vein thrombosis involving the right small saphenous vein. There is no evidence of deep vein thrombosis in the lower extremity. A cystic structure measuring 2 cm high by 4 cm wide by greater than 4.7 cm long is found in the popliteal fossa.  *See table(s) above for measurements and observations. Electronically signed by Monica Martinez MD on 11/02/2018 at 1:59:43 PM.    Final     Recent Labs: Lab Results  Component Value Date   WBC 6.7 11/21/2018   HGB 14.4 11/21/2018   PLT 178.0 11/21/2018   NA 144 11/21/2018   K 4.1 11/21/2018   CL 105 11/21/2018   CO2 30 11/21/2018   GLUCOSE 109 (H) 11/21/2018   BUN 39 (H) 11/21/2018   CREATININE 1.53 (H) 11/21/2018   BILITOT 0.4 11/21/2018   ALKPHOS 92 11/21/2018   AST 28 11/21/2018   ALT 63 (H) 11/21/2018   PROT 6.1 11/21/2018   ALBUMIN 3.6 11/21/2018   CALCIUM 8.6  11/21/2018   GFRAA >60 11/10/2018  November 05, 2018 ESR 127 August 12, 2017 uric acid 13.5  Speciality Comments: No specialty comments available.  Procedures:  No procedures performed Allergies: Prednisone; Metformin and related; and Sulfa antibiotics   Assessment / Plan:     Visit  Diagnoses: Idiopathic chronic gout of multiple sites without tophus - Synovial analysis revealed urate crystals 11/05/18.  Patient has crystal proven gout.  He has been having recurrent flares.  He states he recently ran out of colchicine due to insurance situation.  He has some colchicine currently.  He has been taking colchicine 1 tablet a day and allopurinol 200 mg a day.  I could not find a uric acid on him since August last year.  We will check his uric acid levels today.  I would like to adjust his dosing according to that.  Currently he is having some tenderness in his MTP joints.  I have advised him to continue on the current regimen for right now.  Once I have values available I can adjust the dosing.  I reviewed x-rays of his bilateral hands and bilateral feet which were consistent with osteoarthritis.  Detailed counseling regarding gout was provided.  Dietary and pharmaceutical management was discussed.  Pain in both feet - Plan: Uric acid, Sedimentation rate, Rheumatoid factor, Cyclic citrul peptide antibody, IgG  Hyperlipidemia associated with type 2 diabetes mellitus (HCC)-he is on Lipitor.  Hypertension associated with diabetes (HCC)-he is on amlodipine and takes Lasix for pedal edema.  Heart murmur  History of anxiety  History of BPH   Orders: Orders Placed This Encounter  Procedures  . Uric acid  . Sedimentation rate  . Rheumatoid factor  . Cyclic citrul peptide antibody, IgG   Meds ordered this encounter  Medications  . colchicine 0.6 MG tablet    Sig: Take 1 tablet (0.6 mg total) by mouth daily. Take BID if experiencing a flare.    Dispense:  60 tablet    Refill:  1    Face-to-face time spent with patient was 45 minutes. Greater than 50% of time was spent in counseling and coordination of care.  Follow-Up Instructions: Return for Gout.   Bo Merino, MD  Note - This record has been created using Editor, commissioning.  Chart creation errors have been sought,  but may not always  have been located. Such creation errors do not reflect on  the standard of medical care.

## 2018-11-20 NOTE — Patient Outreach (Signed)
[facility]) Care Management  11/20/2018  Jerry York Apr 18, 1951 [id number]  [facility] CSW arrived at [facility] to complete routine PAC Consult. [facility] CSW unable to find patient in room and was informed by LPN that patient moved to the 500 hall in a private room. [facility] CSW went to new room but still was unable to locate patient. [facility] CSW was unable to complete PAC Consult. [facility] CSW went by SNF social worker's office but was informed that she is out of the office today. [facility] CSW will follow up with SNF within one-two weeks and re-attempt visit.  Dickie La, BSW, MSW, LCSW [facility] [name] Phone: [phone] Fax: [phone]

## 2018-11-21 ENCOUNTER — Encounter: Payer: Self-pay | Admitting: Family Medicine

## 2018-11-21 ENCOUNTER — Ambulatory Visit (INDEPENDENT_AMBULATORY_CARE_PROVIDER_SITE_OTHER): Payer: PPO | Admitting: Family Medicine

## 2018-11-21 ENCOUNTER — Other Ambulatory Visit: Payer: Self-pay

## 2018-11-21 VITALS — BP 136/84 | HR 79 | Temp 97.6°F | Ht 66.0 in | Wt 179.8 lb

## 2018-11-21 DIAGNOSIS — N183 Chronic kidney disease, stage 3 (moderate): Secondary | ICD-10-CM | POA: Diagnosis not present

## 2018-11-21 DIAGNOSIS — I152 Hypertension secondary to endocrine disorders: Secondary | ICD-10-CM

## 2018-11-21 DIAGNOSIS — E1159 Type 2 diabetes mellitus with other circulatory complications: Secondary | ICD-10-CM | POA: Diagnosis not present

## 2018-11-21 DIAGNOSIS — M1A09X0 Idiopathic chronic gout, multiple sites, without tophus (tophi): Secondary | ICD-10-CM

## 2018-11-21 DIAGNOSIS — E1169 Type 2 diabetes mellitus with other specified complication: Secondary | ICD-10-CM | POA: Diagnosis not present

## 2018-11-21 DIAGNOSIS — Z794 Long term (current) use of insulin: Secondary | ICD-10-CM

## 2018-11-21 DIAGNOSIS — I10 Essential (primary) hypertension: Secondary | ICD-10-CM

## 2018-11-21 DIAGNOSIS — E785 Hyperlipidemia, unspecified: Secondary | ICD-10-CM | POA: Diagnosis not present

## 2018-11-21 DIAGNOSIS — E1122 Type 2 diabetes mellitus with diabetic chronic kidney disease: Secondary | ICD-10-CM | POA: Diagnosis not present

## 2018-11-21 LAB — COMPREHENSIVE METABOLIC PANEL
ALK PHOS: 92 U/L (ref 39–117)
ALT: 63 U/L — AB (ref 0–53)
AST: 28 U/L (ref 0–37)
Albumin: 3.6 g/dL (ref 3.5–5.2)
BILIRUBIN TOTAL: 0.4 mg/dL (ref 0.2–1.2)
BUN: 39 mg/dL — AB (ref 6–23)
CO2: 30 mEq/L (ref 19–32)
Calcium: 8.6 mg/dL (ref 8.4–10.5)
Chloride: 105 mEq/L (ref 96–112)
Creatinine, Ser: 1.53 mg/dL — ABNORMAL HIGH (ref 0.40–1.50)
GFR: 48.53 mL/min — ABNORMAL LOW (ref >60.00)
GLUCOSE: 109 mg/dL — AB (ref 70–99)
Potassium: 4.1 mEq/L (ref 3.5–5.1)
SODIUM: 144 meq/L (ref 135–145)
TOTAL PROTEIN: 6.1 g/dL (ref 6.0–8.3)

## 2018-11-21 LAB — CBC
HCT: 43.6 % (ref 39.0–52.0)
HEMOGLOBIN: 14.4 g/dL (ref 13.0–17.0)
MCHC: 33.1 g/dL (ref 30.0–36.0)
MCV: 92 fl (ref 78.0–100.0)
Platelets: 178 10*3/uL (ref 150.0–400.0)
RBC: 4.74 Mil/uL (ref 4.22–5.81)
RDW: 17 % — AB (ref 11.5–15.5)
WBC: 6.7 10*3/uL (ref 4.0–10.5)

## 2018-11-21 LAB — LIPID PANEL
Cholesterol: 154 mg/dL (ref 0–200)
HDL: 38 mg/dL — AB (ref >39.00)
NonHDL: 116.38
Total CHOL/HDL Ratio: 4
Triglycerides: 359 mg/dL — ABNORMAL HIGH (ref 0.0–149.0)
VLDL: 71.8 mg/dL — ABNORMAL HIGH (ref 0.0–40.0)

## 2018-11-21 LAB — LDL CHOLESTEROL, DIRECT: LDL DIRECT: 63 mg/dL

## 2018-11-21 NOTE — Patient Instructions (Signed)
It was very nice to see you today!  I am glad that you are feeling better.  We will check blood work.  No med changes today.  Come back in 3 months, or sooner as needed.   Take care, Dr [name]

## 2018-11-21 NOTE — Assessment & Plan Note (Signed)
Advised him to follow-up with endocrinology soon.  Currently on Novolin 30 units 3 times daily with meals.

## 2018-11-21 NOTE — Patient Outreach (Signed)
[facility]) Care Management  11/21/2018  Jerry York November 04, 1951 [id number]   Transition of Care Referral  Referral Date: 11/20/18 Referral Source: [facility] UM Dept-Urgent Date of Admission: 11/03/18 Diagnosis: "acute gout flare" Date of Hospital Discharge: 11/10/18 Facility: [facility] Date of Discharge Home: 11/19/18 Insurance: [facility]    Outreach attempt #2 to patient. No answer at present.     Plan: RN CM will make outreach attempt to patient within 3-4 business days.    [name], RN,BSN,CCM [facility] Care Management Telephonic Care Management Coordinator Direct Phone: [phone] Toll Free: [phone] Fax: [phone]

## 2018-11-21 NOTE — Progress Notes (Signed)
Subjective:  Jerry York is a 67 y.o. male who presents today for a hospital follow up visit.  HPI:  Summary of Hospital admission: Reason for admission: Gout flare, fall, inability to ambulate Date of admission: 11/03/2018 Date of discharge: 11/10/2018 Summary of Hospital course: Patient presented to the ED on 11/03/2018 after suffering a fall in the setting of bilateral severe ankle pain.  He was admitted for inability to ambulate.  He underwent arthrocentesis on 11/05/2018 which was consistent with gout.  He was started on IV steroids and allopurinol.  He was evaluated by physical therapy recommended placement into a skilled nursing facility.  Interim History:   Bilateral Ankle Pain / gout  Patient is fortunately doing much better since getting home about a week ago.  He still has a little bit of pain and swelling to his bilateral ankles, however this is much improved.  He is now able to ambulate with a cane.  Currently on allopurinol 200 mg daily.  He is also on colchicine 0.6 mg daily.  He will be following up next week with rheumatology.  Hypertension On Norvasc 5 mg daily and Lopressor 100 mg twice daily.  No side effects.   Dyslipidemia On atorvastatin 20 mg daily.  Type 2 diabetes Follows with endocrinology.  Sugars have been elevated recently due to his systemic steroids.   ROS: Per HPI, otherwise a complete review of systems was negative.   PMH:  The following were reviewed and entered/updated in epic: Past Medical History:  Diagnosis Date  . Anxiety   . Bilateral renal cysts   . Foley catheter in place   . Gout    02-22-2017 acute right foot gout---  per pt resolved  . Gross hematuria   . Heart murmur   . Hydronephrosis, left   . Hyperlipidemia   . Hypertension   . Renal insufficiency   . Type 2 diabetes mellitus ([place])   . Urinary retention   . Wears glasses    Patient Active Problem List   Diagnosis Date Noted  . Foot pain, right   . Unsteady  gait   . Right knee pain 01/10/2018  . Lower extremity edema 12/11/2017  . Hypertriglyceridemia 11/27/2017  . Family history of premature CAD 11/27/2017  . 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 History of BPH 08/14/2017  . Type 2 diabetes mellitus ([place]) 08/09/2017  . Gout 08/09/2017  . Hydronephrosis, left 08/09/2017  . Anxiety 08/09/2017  . Chest pain with low risk for cardiac etiology 08/09/2017  . Hyperlipidemia associated with type 2 diabetes mellitus ([place]) 08/08/2017  . Hypertension associated with diabetes ([place]) 08/08/2017   Past Surgical History:  Procedure Laterality Date  . APPENDECTOMY  age 53  . CYSTOSCOPY WITH URETEROSCOPY AND STENT PLACEMENT Left 05/08/2017   Procedure: URETEROSCOPY AND STENT PLACEMENT;  Surgeon: Kathie Rhodes, MD;  Location: [facility];  Service: Urology;  Laterality: Left;  . CYSTOSCOPY/RETROGRADE/URETEROSCOPY Bilateral 05/08/2017   Procedure: CYSTOSCOPY/ BILATERAL RETROGRADE;  Surgeon: Kathie Rhodes, MD;  Location: [facility];  Service: Urology;  Laterality: Bilateral;  . INGUINAL HERNIA REPAIR Right 10/10/2000  . KNEE ARTHROSCOPY Right 1980's  . SHOULDER ARTHROSCOPY WITH OPEN ROTATOR CUFF REPAIR Right 1990's  . TRANSURETHRAL RESECTION OF PROSTATE      Family History  Problem Relation Age of Onset  . Congestive Heart Failure Mother        Related to valve disease.  Opted to treat medically  .  Hypertension Mother   . Hyperlipidemia Mother   . Hypertension Father   . Hyperlipidemia Father   . Coronary artery disease Father 7       History of CABG  . Hypertension Brother   . Hypertension Daughter   . Hypertension Son     Medications- Reconciled discharge and current medications in Epic.  Current Outpatient Medications  Medication Sig Dispense Refill  . acetaminophen (TYLENOL) 325 MG tablet Take 650 mg by mouth every 6 (six) hours as needed for moderate pain.    [name]  allopurinol (ZYLOPRIM) 100 MG tablet Take 1 tablet (100 mg total) by mouth daily. (Patient taking differently: Take 200 mg by mouth daily. ) 30 tablet 0  . amLODipine (NORVASC) 5 MG tablet TAKE 1 TABLET BY MOUTH ONCE DAILY (Patient taking differently: Take 5 mg by mouth daily. ) 90 tablet 0  . atorvastatin (LIPITOR) 20 MG tablet TAKE ONE TABLET BY MOUTH EVERY EVENING (Patient taking differently: Take 20 mg by mouth. ) 90 tablet 1  . [name] Take 5,000 mg by mouth 2 (two) times daily.     . clonazePAM (KLONOPIN) 0.5 MG tablet Take 1 tablet (0.5 mg total) by mouth 2 (two) times daily. 180 tablet 1  . colchicine 0.6 MG tablet Take 1 tablet (0.6 mg total) by mouth daily. 30 tablet 1  . Continuous Blood Gluc Receiver ([name] 14 DAY READER) DEVI USE AS DIRECTED THREE TIMES DAILY 3 Device 3  . Continuous Blood Gluc Sensor ([name] 14 DAY SENSOR) MISC 1 Units by Does not apply route every 14 (fourteen) days. 1 each 11  . furosemide (LASIX) 80 MG tablet Take 1 tablet (80 mg total) by mouth 2 (two) times daily. 180 tablet 3  . glucose blood ([name] TEST STRIPS) test strip Check blood sugars 2-3 times per day. DX: E11.9 300 each 2  . glucose monitoring kit ([name]) monitoring kit 1 each by Does not apply route as needed for other. DX Code: E11.9 1 each 0  . HYDROcodone-acetaminophen (NORCO/VICODIN) 5-325 MG tablet Take 1 tablet by mouth every 6 (six) hours as needed. (Patient taking differently: Take 1 tablet by mouth every 6 (six) hours as needed for moderate pain or severe pain. ) 12 tablet 0  . insulin regular (NOVOLIN R,HUMULIN R) 100 units/mL injection Inject 30 Units into the skin 3 (three) times daily before meals.    . metoprolol tartrate (LOPRESSOR) 100 MG tablet Take 1 tablet (100 mg total) by mouth 2 (two) times daily. 180 tablet 0  . predniSONE (DELTASONE) 10 MG tablet Take 4 tablets (40 mg total) by mouth daily with breakfast for 3 days, THEN 3 tablets (30 mg total) daily  with breakfast for 5 days, THEN 2 tablets (20 mg total) daily with breakfast for 5 days, THEN 1 tablet (10 mg total) daily with breakfast for 5 days, THEN 0.5 tablets (5 mg total) daily with breakfast for 5 days. 50 tablet 0   No current facility-administered medications for this visit.     Allergies-reviewed and updated Allergies  Allergen Reactions  . Prednisone Nausea And Vomiting    Elevated blood sugar  . Metformin And Related Diarrhea  . Sulfa Antibiotics Other (See Comments)    "make my feet burn"    Social History   Socioeconomic History  . Marital status: Married    Spouse name: Not on file  . Number of children: Not on file  . Years of education: Not on file  .  Highest education level: Not on file  Occupational History  . Occupation: Retired  Scientific laboratory technician  . Financial resource strain: Not on file  . Food insecurity:    Worry: Not on file    Inability: Not on file  . Transportation needs:    Medical: Not on file    Non-medical: Not on file  Tobacco Use  . Smoking status: Former Smoker    Packs/day: 0.25    Years: 1.00    Pack years: 0.25  . Smokeless tobacco: Never Used  Substance and Sexual Activity  . Alcohol use: No  . Drug use: No  . Sexual activity: Yes    Birth control/protection: Surgical    Comment: vasectomy  Lifestyle  . Physical activity:    Days per week: Not on file    Minutes per session: Not on file  . Stress: Not on file  Relationships  . Social connections:    Talks on phone: Not on file    Gets together: Not on file    Attends religious service: Not on file    Active member of club or organization: Not on file    Attends meetings of clubs or organizations: Not on file    Relationship status: Not on file  Other Topics Concern  . Not on file  Social History Narrative  . Not on file    Objective:  Physical Exam: BP 136/84 (BP Location: Right Arm, Patient Position: Sitting, Cuff Size: Normal)   Pulse 79   Temp 97.6 F (36.4  C) (Oral)   Ht 5' 6"  (1.676 m)   Wt 179 lb 12.8 oz (81.6 kg)   SpO2 94%   BMI 29.02 kg/m   Gen: NAD, resting comfortably CV: RRR with no murmurs appreciated Pulm: NWOB, CTAB with no crackles, wheezes, or rhonchi GI: Normal bowel sounds present. Soft, Nontender, Nondistended. MSK: -Bilateral ankles: Strength 5 out of 5 throughout.  Neurovascular intact distally.  No gross deformities or effusions. Skin: Warm, dry Neuro: Grossly normal, moves all extremities Psych: Normal affect and thought content  Assessment/Plan:  Gout Symptoms are improving however it is unclear why patient had such severe gout flare requiring hospitalization for 7 days followed by weekly rehab stay.  He will continue allopurinol 200 mg daily and colchicine 0.6 mg daily.  He will be following up with dermatology next week.  Hypertension associated with diabetes ([place]) At goal.  Continue Norvasc 5 mg daily and Lopressor 100 mg twice daily.  Check CBC and CMET.  Hyperlipidemia associated with type 2 diabetes mellitus (HCC) Check lipid panel.  Continue atorvastatin 20 mg daily.  Type 2 diabetes mellitus ([place]) Advised him to follow-up with endocrinology soon.  Currently on Novolin 30 units 3 times daily with meals.  Algis Greenhouse. Jerline Pain, MD 11/21/2018 12:00 PM

## 2018-11-21 NOTE — Assessment & Plan Note (Signed)
Check lipid panel.  Continue atorvastatin 20 mg daily. 

## 2018-11-21 NOTE — Assessment & Plan Note (Signed)
Symptoms are improving however it is unclear why patient had such severe gout flare requiring hospitalization for 7 days followed by weekly rehab stay.  He will continue allopurinol 200 mg daily and colchicine 0.6 mg daily.  He will be following up with dermatology next week.

## 2018-11-21 NOTE — Assessment & Plan Note (Signed)
At goal.  Continue Norvasc 5 mg daily and Lopressor 100 mg twice daily.  Check CBC and CMET.

## 2018-11-23 ENCOUNTER — Other Ambulatory Visit: Payer: Self-pay

## 2018-11-23 NOTE — Addendum Note (Signed)
Addended by: Marnee Spring on: 11/23/2018 01:23 PM   Modules accepted: Orders

## 2018-11-23 NOTE — Patient Outreach (Signed)
[facility] [place] [facility]) Care Management  11/23/2018  Jerry York 24-Dec-1951 [id number]   Transition of Care Referral  Referral Date:11/20/18 Referral Source:[facility] UM Dept-Urgent Date of Admission:11/03/18 Diagnosis:"acute gout flare" Date ofHospitalDischarge: 11/10/18 [facility] Date of Discharge Home: 11/19/18 Insurance:HTA    Outreach attempt #3 to patient. Spoke with patient. He reports that he is doing fairly well since return home. Patient has already completed post discharge PCP appt on 11/21/18. He goes for appt with rheumatologist on 11/26/18. He voices that he has been driving since returning home. He lives with supportive spouse who is able to assist with his care needs if needed. Patient voices that he is independent with ADLs/IADLs. He is using a cane to get around and assist with ambulation. He states that his last fall was prior to his hospitalization.   Conditions: Patient was hospitalized from 11/03/18-11/10/18 for leg pain and gout. He was then sent to [facility] for rehab stay. Patient was discharged home on 11/19/18. He voices that he is continuing to have off and on gout pain issues. Patient with PMH of DM,CKD,HTN, HLD, gout and anxiety. He voices that he is checking his blood sugars 3x/day. Blood sugars are running in the upper 100s. Patient checked cbg while on the phone with nurse and cbg 172 this am prior to eating. He admits that he could use some assistance with managing all of his medical conditions. He was not set up with [facility] services at discharge. Patient met with [facility] SW while at facility. He was agreeable to community nurse to assist with his medical conditions post discharge.  Medications: He reports taking about 15 meds. He voices financial hardship with paying for meds at times especially his Novolog and gout med.  Consent: [facility] services reviewed and discussed with patient. Verbal consent for services given.   Plan: RN  CM will send [facility] community nurse referral for [facility] and further in home eval and assessment of care needs and mgmt of chronic conditions. RN CM will send [place] referral for polypharmacy med review and possible med assistance.   Enzo Montgomery, RN,BSN,CCM [place] Management Telephonic Care Management Coordinator Direct Phone: [phone] Toll Free: [phone] Fax: [phone]

## 2018-11-26 ENCOUNTER — Ambulatory Visit: Payer: PPO | Admitting: Rheumatology

## 2018-11-26 ENCOUNTER — Other Ambulatory Visit: Payer: Self-pay | Admitting: Licensed Clinical Social Worker

## 2018-11-26 ENCOUNTER — Encounter: Payer: Self-pay | Admitting: Rheumatology

## 2018-11-26 ENCOUNTER — Other Ambulatory Visit: Payer: Self-pay

## 2018-11-26 ENCOUNTER — Telehealth: Payer: Self-pay | Admitting: Pharmacy Technician

## 2018-11-26 VITALS — BP 128/79 | HR 69 | Resp 15 | Ht 66.0 in | Wt 176.8 lb

## 2018-11-26 DIAGNOSIS — I10 Essential (primary) hypertension: Secondary | ICD-10-CM | POA: Diagnosis not present

## 2018-11-26 DIAGNOSIS — E1169 Type 2 diabetes mellitus with other specified complication: Secondary | ICD-10-CM

## 2018-11-26 DIAGNOSIS — M79672 Pain in left foot: Secondary | ICD-10-CM

## 2018-11-26 DIAGNOSIS — M79671 Pain in right foot: Secondary | ICD-10-CM

## 2018-11-26 DIAGNOSIS — Z8659 Personal history of other mental and behavioral disorders: Secondary | ICD-10-CM | POA: Insufficient documentation

## 2018-11-26 DIAGNOSIS — E1159 Type 2 diabetes mellitus with other circulatory complications: Secondary | ICD-10-CM

## 2018-11-26 DIAGNOSIS — I152 Hypertension secondary to endocrine disorders: Secondary | ICD-10-CM

## 2018-11-26 DIAGNOSIS — Z87438 Personal history of other diseases of male genital organs: Secondary | ICD-10-CM

## 2018-11-26 DIAGNOSIS — M1A09X0 Idiopathic chronic gout, multiple sites, without tophus (tophi): Secondary | ICD-10-CM

## 2018-11-26 DIAGNOSIS — E785 Hyperlipidemia, unspecified: Secondary | ICD-10-CM

## 2018-11-26 DIAGNOSIS — R011 Cardiac murmur, unspecified: Secondary | ICD-10-CM | POA: Diagnosis not present

## 2018-11-26 MED ORDER — COLCHICINE 0.6 MG PO TABS: 0.6000 mg | ORAL_TABLET | Freq: Every day | ORAL | 1 refills | Status: DC

## 2018-11-26 NOTE — Patient Instructions (Signed)

## 2018-11-26 NOTE — Telephone Encounter (Signed)
Patient expressed gout medication was unaffordable. Ran test claims, Colcrys and Mitigare are non formulary. Patient provided income and household size. He was Approved for Gout grant from the [employer]. Will call and provide pharmacy with copay card details.  BIN- [id number] PCN- [id number] ID- [id number] Group- [id number]  9:08 AM Dorthula Nettles, CPhT

## 2018-11-26 NOTE — Progress Notes (Signed)
Dr Lavone Neri interpretation of your lab work:  Good news! Your cholesterol numbers look much better.    Your blood counts, kidney function, liver function, and electrolytes are all stable.  We do not need to make any changes to your treatment plan at this time.   If you have any additional questions, please give [name] a call or send [name] a message through [place].  Take care, Dr [name]

## 2018-11-26 NOTE — Patient Outreach (Signed)
Initial telephone outreach:  Placed call to patient who was not at home per wife.  PLAN: call back later in the afternoon as per wife's request.  Rowe Pavy, RN, BSN, CEN [facility] Building Coordinator [phone]

## 2018-11-26 NOTE — Patient Outreach (Addendum)
[facility]) Care Management  11/26/2018  Jerry York 20-Dec-1951 [id number]  [facility] CSW reviewed [name] on 11/26/18 and was notified that patient discharged back home from [facility] on 11/19/18. [facility] CSW will sign off at this time. [facility] Telephonic RNCM has submitted referrals for both [facility] pharmacy and nursing already.   Dickie La, BSW, MSW, LCSW [employer] [email or web address] Phone: [phone] Fax: [phone]

## 2018-11-27 ENCOUNTER — Other Ambulatory Visit: Payer: Self-pay

## 2018-11-27 ENCOUNTER — Other Ambulatory Visit: Payer: Self-pay | Admitting: Pharmacist

## 2018-11-27 ENCOUNTER — Telehealth: Payer: Self-pay | Admitting: Rheumatology

## 2018-11-27 LAB — URIC ACID: Uric Acid, Serum: 8.2 mg/dL — ABNORMAL HIGH (ref 4.0–8.0)

## 2018-11-27 LAB — RHEUMATOID FACTOR: Rhuematoid fact SerPl-aCnc: <14 IU/mL (ref <14)

## 2018-11-27 LAB — CYCLIC CITRUL PEPTIDE ANTIBODY, IGG

## 2018-11-27 LAB — SEDIMENTATION RATE: Sed Rate: 34 mm/h — ABNORMAL HIGH (ref 0–20)

## 2018-11-27 MED ORDER — ALLOPURINOL 300 MG PO TABS: 300.0000 mg | ORAL_TABLET | Freq: Every day | ORAL | 0 refills | Status: DC

## 2018-11-27 NOTE — Telephone Encounter (Signed)
-----   Message from Pollyann Savoy, MD sent at 11/27/2018  8:34 AM EST ----- Uric acid is still elevated.  Please call in a new prescription for allopurinol 300 mg p.o. daily.  Explained to the patient that it could be only 1 tablet a day instead of 2 tablets of allopurinol 100 mg.  We should get labs in a month which should  include CBC CMP and uric acid.  He has appointment on December 23.  Labs can be obtained at that visit.

## 2018-11-27 NOTE — Patient Outreach (Signed)
Telephone outreach:  Placed call to patient who reports that he continues to have gout pain. Reports walking with cane.  Reviewed reason for referral and call. Offered home visit for 11/29/2018. Confirmed address.  Plan: Initial home visit to assess needs. MD office does transition of care.  Rowe Pavy, RN, BSN, CEN [facility] [employer] [phone]

## 2018-11-27 NOTE — Telephone Encounter (Signed)
Patient advised Uric acid is still elevated.  Prescription called in a for allopurinol 300 mg p.o. daily.  Explained to the patient that it could be only 1 tablet. Patient advised we will obtain labs at his appointment on December 23. Patient verbalized understanding.

## 2018-11-27 NOTE — Patient Outreach (Addendum)
[facility]) Care Management  [facility]  11/27/2018  Jerry York September 05, 1951 [id number]   Reason for referral: Medication assistance with colchicine and insulin, medication review  Per review of chart, patient approved for [employer] for colchcine 11/26/2018.    Unsuccessful telephone call attempt #1 to patient.   HIPAA compliant voicemail left requesting a return call  Plan:  Noted that another [facility] discipline has recently mailed patient an unsuccessful outreach letter, I will make another outreach attempt to patient within 3-4 business days   Haynes Hoehn, PharmD, [facility] Clinical Pharmacist [facility] [employer] [phone]

## 2018-11-27 NOTE — Progress Notes (Signed)
Uric acid is still elevated.  Please call in a new prescription for allopurinol 300 mg p.o. daily.  Explained to the patient that it could be only 1 tablet a day instead of 2 tablets of allopurinol 100 mg.  We should get labs in a month which should include CBC CMP and uric acid.  He has appointment on December 23.  Labs can be obtained at that visit.

## 2018-11-27 NOTE — Telephone Encounter (Signed)
Patient returning your call.

## 2018-11-29 ENCOUNTER — Other Ambulatory Visit: Payer: Self-pay

## 2018-11-29 NOTE — Patient Outreach (Signed)
[facility]) Care Management   11/29/2018  Jerry York 25-Jul-1951 [id number]  Jerry York is an 67 y.o. male  Subjective: Patient reports that he is doing much better. Reports he was in the doughnut hole and missed 1 month of gout medications. Reports acute onset of severe foot pain and inability to walk. Reports fell outside trying to make it to MD office. EMS took patient to ED and patient was discharged home and still unable to ambulate. EMS took patient home. 24 hours later patient returned to the ED and was admitted for a 1 week stay with gout.  Patient discharged to short term rehab for 2 weeks and returned home 1 week ago. Patient reports he was supposed to have home health PT but no one showed up. Reports he no longer needs PT.  Patient reports that he is walking with a cane and is able to perform his ADLS and IADLS. Wife available to assist as needed. Patient reports that his DM is under good control. Reports with recent steroids he had elevated CBG. But since discharge home CBG's in 200's.  Today reading of 202. Reports self monitoring 3-4 times per day.   Objective:  Awake and alert. Home neat and clean.  Ambulates with a limp. Foot deformity noted. Ambulates turning right foot outward.  Vitals:   11/29/18 1018  BP: 130/72  Pulse: 76  Resp: 16  SpO2: 96%    Review of Systems  Constitutional: Positive for malaise/fatigue.  HENT: Negative.   Eyes:       Wears glasses.  Respiratory: Positive for cough.   Cardiovascular: Positive for leg swelling.  Gastrointestinal: Negative.   Genitourinary: Negative.   Musculoskeletal: Positive for back pain, falls and joint pain.       Recent flare of gout  Skin:       Dry   Neurological: Negative.   Endo/Heme/Allergies: Bruises/bleeds easily.  Psychiatric/Behavioral: Negative.     Physical Exam  Constitutional: He is oriented to person, place, and time. He appears well-developed and well-nourished.   Cardiovascular: Normal rate, regular rhythm, normal heart sounds and intact distal pulses.  Respiratory: Effort normal and breath sounds normal.  GI: Soft. Bowel sounds are normal.  Musculoskeletal: Normal range of motion. He exhibits edema.  Left leg 2 plus edema, right leg 1 plus edema  Neurological: He is alert and oriented to person, place, and time.  Skin: Skin is warm and dry.  Psychiatric: He has a normal mood and affect. His behavior is normal. Thought content normal.    Encounter Medications:   Outpatient Encounter Medications as of 11/29/2018  Medication Sig  . acetaminophen (TYLENOL) 325 MG tablet Take 650 mg by mouth every 6 (six) hours as needed for moderate pain.  [name] allopurinol (ZYLOPRIM) 300 MG tablet Take 1 tablet (300 mg total) by mouth daily.  [name] amLODipine (NORVASC) 5 MG tablet TAKE 1 TABLET BY MOUTH ONCE DAILY (Patient taking differently: Take 5 mg by mouth daily. )  . atorvastatin (LIPITOR) 20 MG tablet TAKE ONE TABLET BY MOUTH EVERY EVENING (Patient taking differently: Take 20 mg by mouth. )  . clonazePAM (KLONOPIN) 0.5 MG tablet Take 1 tablet (0.5 mg total) by mouth 2 (two) times daily.  . colchicine 0.6 MG tablet Take 1 tablet (0.6 mg total) by mouth daily. Take BID if experiencing a flare.  . Continuous Blood Gluc Receiver ([name] 14 DAY READER) DEVI USE AS DIRECTED THREE TIMES DAILY  . Continuous Blood Gluc  Sensor ([name] 14 DAY SENSOR) MISC 1 Units by Does not apply route every 14 (fourteen) days.  . furosemide (LASIX) 80 MG tablet Take 1 tablet (80 mg total) by mouth 2 (two) times daily.  [name] glucose blood ([name] TEST STRIPS) test strip Check blood sugars 2-3 times per day. DX: E11.9  . glucose monitoring kit ([name]) monitoring kit 1 each by Does not apply route as needed for other. DX Code: E11.9  . insulin regular (NOVOLIN R,HUMULIN R) 100 units/mL injection Inject 30 Units into the skin 3 (three) times daily before meals.  . metoprolol  tartrate (LOPRESSOR) 100 MG tablet Take 1 tablet (100 mg total) by mouth 2 (two) times daily.  [name] allopurinol (ZYLOPRIM) 100 MG tablet Take 1 tablet (100 mg total) by mouth daily. (Patient not taking: Reported on 11/29/2018)  . [name] Take 5,000 mg by mouth 2 (two) times daily.   [name] HYDROcodone-acetaminophen (NORCO/VICODIN) 5-325 MG tablet Take 1 tablet by mouth every 6 (six) hours as needed. (Patient not taking: Reported on 11/29/2018)  . predniSONE (DELTASONE) 10 MG tablet Take 4 tablets (40 mg total) by mouth daily with breakfast for 3 days, THEN 3 tablets (30 mg total) daily with breakfast for 5 days, THEN 2 tablets (20 mg total) daily with breakfast for 5 days, THEN 1 tablet (10 mg total) daily with breakfast for 5 days, THEN 0.5 tablets (5 mg total) daily with breakfast for 5 days.   No facility-administered encounter medications on file as of 11/29/2018.     Functional Status:   In your present state of health, do you have any difficulty performing the following activities: 11/29/2018 11/04/2018  Hearing? N N  Vision? N N  Difficulty concentrating or making decisions? N N  Walking or climbing stairs? Y Y  Comment - -  Dressing or bathing? N Y  Doing errands, shopping? N Y  Comment - -  Conservation officer, nature and eating ? N -  Comment - -  Using the Toilet? N -  In the past six months, have you accidently leaked urine? N -  Do you have problems with loss of bowel control? N -  Managing your Medications? N -  Managing your Finances? N -  Housekeeping or managing your Housekeeping? N -  Comment - -  Some recent data might be hidden    Fall/Depression Screening:    Fall Risk  11/29/2018 11/23/2018 10/22/2018  Falls in the past year? 1 1 No  Number falls in past yr: 0 0 -  Injury with Fall? 0 1 -  Risk for fall due to : History of fall(s) History of fall(s);Impaired vision;Impaired balance/gait;Medication side effect;Impaired mobility -  Follow up Falls evaluation completed;Falls  prevention discussed Falls evaluation completed;Education provided -   [facility] 2/9 Scores 11/29/2018 11/23/2018 10/22/2018 02/23/2018 01/26/2018 11/10/2017 08/21/2017  PHQ - 2 Score 0 0 0 0 0 0 0    Assessment:   (1) reviewed [facility] program. Provided a new patient packet. Provided my contact card and 24 hour nurse line magnet.  Reviewed current consent signed while inpatient. Patient declines need for change in consent. (2) recent severe gout flare. (3) no advanced directives. (4) DM: last A1c of 8.2.   (5) has all medications and is taking all medications as prescribed.  (6) completed follow up with primary MD, and rheumatologist. (7) Reports ambulation is improved.  Plan:  (1) consent on file. Next follow up planned for 2 weeks. Encouraged patient and or wife to  call sooner if needed. (2) reviewed with patient diet, importance of medications. Encouraged patient to reports change in symptoms to MD. (3) Provided an advanced directive packet and reviewed how to complete. (4) Reviewed with patient the importance of not running out of meds. Reviewed importance and reporting abnormal CBG readings to MD> (5) Reviewed [facility] pharmacy program and encouraged patient to ask for samples from MD's and inform [facility] for problems with medication procurement. (6) reviewed pending follow up appointments and ensured transportation. (7) Encouraged patient to exercise as much as possible. Reviewed fall precautions.   Goal setting and care planning during home visit and primary goal is to avoid a readmission. Next outreach in 2 weeks.  This note and barrier letter sent to MD.  [facility] CM Care Plan Problem One     Most Recent Value  Care Plan Problem One  Recent prolonged admission for gout flare.   Role Documenting the Problem One  Care Management [place] for Problem One  Active  [facility] Long Term Goal   Patient will report no readmission for gout in the next 31 days.  [facility] Long Term Goal Start Date  11/29/18   Interventions for Problem One Long Term Goal  Home visit completed. Reviewed discharge plan and follow up appointments.  Encouraged reporting new or change in symptoms to MD.  [facility] CM Short Term Goal #1   Patient will report no falls in the next 30 days.   [facility] CM Short Term Goal #1 Start Date  11/29/18  Interventions for Short Term Goal #1  Reviewed use of assistive devices and encouraged patient  to use as needed. Reviewed fall risk and use of caution.  [facility] CM Short Term Goal #2   Patient will report taking all his medications as directed in the next 30 days.   [facility] CM Short Term Goal #2 Start Date  11/29/18  Interventions for Short Term Goal #2  Reviewed all medications. Offered suggestions about how not to run out of meds. Encouraged patient to inform [facility] or MD that he can not afford medications vs running out.  Reviewed with patient [facility] pharmacy  program.      Tomasa Rand, RN, BSN, CEN [place] Coordinator [phone]

## 2018-11-30 ENCOUNTER — Other Ambulatory Visit: Payer: Self-pay | Admitting: Pharmacist

## 2018-11-30 NOTE — Patient Outreach (Addendum)
[place] [facility]) Care Management  [place]   11/30/2018  Jerry York 1951-12-20 [id number]  Reason for referral: medication assistance, medication review post discharge  Referral source: [facility] RN Referral medication(s): colchicine and insulin Current insurance: HTA  PMHx: T2DM, CKD-II, HTN, HLD, anxiety, urinary retention, recent hospitalization for acute gout flare  Outreach:  Successful call to Jerry York today.  HIPAA identifiers verified. Patient agreeable to review medications telephonically.  States he has trouble affording colchine and insulin and "shops around" at several pharmacies for prices.  He recently was approved for colchicine via [employer] and has supply at home.  He reports he previously was taking Novolog but due to high co-pay in coverage gap, switched to [employer] brand Relion Novolin R.  He states he pays $25 / vial (~11 day supply).  Reports he applied for Extra Help earlier this year and was denied.   Objective: Allergies  Allergen Reactions  . Metformin And Related Diarrhea  . Sulfa Antibiotics Other (See Comments)    "make my feet burn"    Medications Reviewed Today    Reviewed by Rudean Haskell, [facility] (Pharmacist) on 11/30/18 at 0944  Med List Status: <None>  Medication Order Taking? Sig Documenting Provider Last Dose Status Informant  acetaminophen (TYLENOL) 325 MG tablet [id number] Yes Take 650 mg by mouth every 6 (six) hours as needed for moderate pain. [provider] Taking Active Self       Patient not taking:       Discontinued 31/54/00 [id number] (Duplicate)   allopurinol (ZYLOPRIM) 300 MG tablet [id number] Yes Take 1 tablet (300 mg total) by mouth daily. Bo Merino, MD Taking Active   amLODipine (NORVASC) 5 MG tablet [id number] Yes TAKE 1 TABLET BY MOUTH ONCE DAILY  Patient taking differently:  Take 5 mg by mouth daily.    [name], MD Taking Active Self  atorvastatin (LIPITOR) 20 MG tablet  [id number] Yes TAKE ONE TABLET BY MOUTH EVERY EVENING  Patient taking differently:  Take 20 mg by mouth.    [name], MD Taking Active Self  [name] [id number] No Take 5,000 mg by mouth 2 (two) times daily.  [provider] Not Taking Active Self  clonazePAM (KLONOPIN) 0.5 MG tablet [id number] Yes Take 1 tablet (0.5 mg total) by mouth 2 (two) times daily. [name], MD Taking Active Self  colchicine 0.6 MG tablet [id number] Yes Take 1 tablet (0.6 mg total) by mouth daily. Take BID if experiencing a flare.  Patient taking differently:  Take 0.6 mg by mouth daily. Take BID if experiencing a flare.   Bo Merino, MD Taking Active   Continuous Blood Gluc Receiver ([name] 14 DAY READER) [place] [id number] Yes USE AS DIRECTED THREE TIMES DAILY [name], MD Taking Active Self  Continuous Blood Gluc Sensor ([name] 14 DAY SENSOR) [place] [id number] Yes 1 Units by Does not apply route every 14 (fourteen) days. [name], MD Taking Active Self  furosemide (LASIX) 80 MG tablet [id number] Yes Take 1 tablet (80 mg total) by mouth 2 (two) times daily. [name], MD Taking Active Self  glucose blood ([name] TEST STRIPS) test strip [id number] Yes Check blood sugars 2-3 times per day. DX: E11.9 [name], MD Taking Active Self  glucose monitoring kit ([name]) monitoring kit [id number] Yes 1 each by Does not apply route as needed for other. DX Code: E11.9 [name], MD Taking Active Self  Patient not taking:       Discontinued 11/30/18 0943 (Completed Course)   insulin regular (NOVOLIN R,HUMULIN R) 100 units/mL injection [id number] Yes Inject 30 Units into the skin 3 (three) times daily before meals. [provider] Taking Active Self  metoprolol tartrate (LOPRESSOR) 100 MG tablet [id number] Yes Take 1 tablet (100 mg total) by mouth 2 (two) times daily. [name], MD Taking Active Self        Discontinued 11/30/18 [phone]  (Completed Course)           ASSESSMENT: Date Discharged from Hospital: 11/10/2018 Date Medication Reconciliation Performed: 11/30/2018  Medications Discontinued at Discharge:   Doxycycline   Niacin  Omega-3 acid ethyl esters  New Medications at Discharge:   Allopurinol  Colchicine  Prednisone  Medications with Dose Adjustments at Discharge: . None  Patient was recently discharged from hospital and all medications have been reviewed.  Drugs sorted by system:  Neurologic/Psychologic: clonazepam  Cardiovascular: amlodipine, atorvastatin, furosemide, metoprolol  Endocrine: Novolin R  Pain: acetaminophen  Vitamins/Minerals/Supplements: cinnamon  Miscellaneous:allopurinol, colchicine  Medication Review Findings:  . Completed prednisone taper - removed from profile . Allopurinol - removed duplicate medication entry . Hydrocodone-APAP - no longer taking, removed from profile . Colchicine - taking BID, dose ok for estimated CrCl ~55 ml/min . Allopurinol - 353m daily dose borderline for adjustment per package insert with CrCl 55 ml/min, monitor renal function closely and adjust as warranted  Medication Assistance Findings:  Extra Help:   []  Already receiving Full Extra Help  []  Already receiving Partial Extra Help  []  Eligible based on reported income and assets  [x]  Not Eligible based on reported income and assets  Patient Assistance Programs: 1) Humalog made by [employer] Income requirement met: [x]  Yes []  No []  Unknown o Out-of-pocket prescription expenditure met:    []  Yes [x]  No  []  Unknown  []  Not applicable - May be able to submit letter of financial hardship to override TROOP   Additional medication assistance options reviewed with patient as warranted:  Coupon and Discount pharmacy lists  Plan: -Will reach out to Dr. JDelrae Rend(endocrinologist) regarding substitution to Humalog.  If approved, will start patient assistance program application  process  CRalene Bathe PharmD, BPine [place]3450 [phone]   Addendum: [facility] KCindra Evesoffice.  Ok to substitute to Humalog and apply for patient assistance program.  Reports latest A1C = 8.0 in 09/2018. No long acting insulin on board currently.   Will route [facility] who will assist with coordination of application.   CRalene Bathe PharmD, BR[place]3805 [phone]

## 2018-12-03 ENCOUNTER — Other Ambulatory Visit: Payer: Self-pay | Admitting: Pharmacy Technician

## 2018-12-03 ENCOUNTER — Ambulatory Visit: Payer: PPO | Admitting: Pharmacist

## 2018-12-03 DIAGNOSIS — E790 Hyperuricemia without signs of inflammatory arthritis and tophaceous disease: Secondary | ICD-10-CM | POA: Insufficient documentation

## 2018-12-03 DIAGNOSIS — Z8639 Personal history of other endocrine, nutritional and metabolic disease: Secondary | ICD-10-CM | POA: Insufficient documentation

## 2018-12-03 DIAGNOSIS — I10 Essential (primary) hypertension: Secondary | ICD-10-CM | POA: Insufficient documentation

## 2018-12-03 NOTE — Patient Outreach (Addendum)
[facility]) Care Management  12/03/2018  Jerry York Jun 16, 1951 [id number]                                                  Medication Assistance Referral  Referral From: Jerry York RPh Doree Fudge  Medication/Company: Humalog / [employer] Patient application portion:  Mail along with Letter of [place] Provider application portion: Faxed  to Dr. Sharl Ma   Follow up:  Will follow up with patient in 5-7 business days to confirm application(s) have been received.  Suzan Slick [name] CPhT Certified Pharmacy Technician [facility] Care Management Direct [phone]

## 2018-12-03 NOTE — Progress Notes (Signed)
Office Visit Note  Patient: Jerry York             Date of Birth: 01/10/1951           MRN: [id number]             PCP: [name], MD Referring: [name], MD Visit Date: 12/17/2018 Occupation: @GUAROCC @  Subjective:  Pain in feet.   History of Present Illness: Jerry York is a 67 y.o. male with history of gout and hyperuricemia.  He was in started on increased dose of allopurinol at last visit taking 300 mg a day.  He is also been taking colchicine 0.6 mg twice daily.  He states he continues to have discomfort in his feet due to fluid retention but he has not had any gout flare.  Noticed some cramps in his bilateral hands.  He states the [place] comes to do home visits.  She was concerned because he rotates his foot out when he walks.  He states he does have some chronic discomfort in his right hip and right knee joint.  He also has chronic diarrhea.  He states the diarrhea has been worse on colchicine.  Activities of Daily Living:  Patient reports morning stiffness for 5-10 minutes.   Patient Reports nocturnal pain.  Difficulty dressing/grooming: Denies Difficulty climbing stairs: Reports Difficulty getting out of chair: Denies Difficulty using hands for taps, buttons, cutlery, and/or writing: Denies  Review of Systems  Constitutional: Positive for fatigue.  HENT: Positive for mouth dryness. Negative for mouth sores, trouble swallowing and trouble swallowing.   Eyes: Negative for pain, redness, itching and dryness.  Respiratory: Negative for shortness of breath, wheezing and difficulty breathing.   Cardiovascular: Negative for chest pain, palpitations and swelling in legs/feet.  Gastrointestinal: Positive for diarrhea. Negative for abdominal pain, blood in stool, constipation, nausea and vomiting.       Related to colchicine  Endocrine: Negative for increased urination.  Genitourinary: Negative for painful urination, pelvic pain and urgency.    Musculoskeletal: Positive for arthralgias and joint pain. Negative for joint swelling and morning stiffness.  Skin: Negative for rash and hair loss.  Allergic/Immunologic: Negative for susceptible to infections.  Neurological: Negative for dizziness, light-headedness, headaches, memory loss and weakness.  Hematological: Negative for bruising/bleeding tendency.  Psychiatric/Behavioral: Negative for confusion. The patient is not nervous/anxious.     PMFS History:  Patient Active Problem List   Diagnosis Date Noted  . Hyperuricemia 12/03/2018  . Essential hypertension 12/03/2018  . History of diabetes mellitus 12/03/2018  . History of anxiety 11/26/2018  . Heart murmur 11/26/2018  . Foot pain, right   . Unsteady gait   . Right knee pain 01/10/2018  . Lower extremity edema 12/11/2017  . Hypertriglyceridemia 11/27/2017  . Family history of premature CAD 11/27/2017  . 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 History of BPH 08/14/2017  . Type 2 diabetes mellitus ([place]) 08/09/2017  . Gout 08/09/2017  . Hydronephrosis, left 08/09/2017  . Anxiety 08/09/2017  . Chest pain with low risk for cardiac etiology 08/09/2017  . Hyperlipidemia associated with type 2 diabetes mellitus ([place]) 08/08/2017  . Hypertension associated with diabetes ([place]) 08/08/2017    Past Medical History:  Diagnosis Date  . Anxiety   . Bilateral renal cysts   . Foley catheter in place   . Gout    02-22-2017 acute right foot gout---  per pt resolved  . Gout   . Johney Maine  hematuria   . Heart murmur   . Hydronephrosis, left   . Hyperlipidemia   . Hypertension   . Renal insufficiency   . Type 2 diabetes mellitus ([place])   . Urinary retention   . Wears glasses     Family History  Problem Relation Age of Onset  . Congestive Heart Failure Mother        Related to valve disease.  Opted to treat medically  . Hypertension Mother   . Hyperlipidemia Mother   . Hypertension Father   .  Hyperlipidemia Father   . Coronary artery disease Father 80       History of CABG  . Hypertension Brother   . Diabetes Brother   . Hypertension Daughter   . Hypertension Son   . Hypertension Brother   . Hypertension Brother    Past Surgical History:  Procedure Laterality Date  . APPENDECTOMY  age 63  . CYSTOSCOPY WITH URETEROSCOPY AND STENT PLACEMENT Left 05/08/2017   Procedure: URETEROSCOPY AND STENT PLACEMENT;  Surgeon: Kathie Rhodes, MD;  Location: [facility];  Service: Urology;  Laterality: Left;  . CYSTOSCOPY/RETROGRADE/URETEROSCOPY Bilateral 05/08/2017   Procedure: CYSTOSCOPY/ BILATERAL RETROGRADE;  Surgeon: Kathie Rhodes, MD;  Location: [facility];  Service: Urology;  Laterality: Bilateral;  . INGUINAL HERNIA REPAIR Right 10/10/2000  . KNEE ARTHROSCOPY Right 1980's  . NASAL FRACTURE SURGERY     in highschool   . SHOULDER ARTHROSCOPY WITH OPEN ROTATOR CUFF REPAIR Right 1990's  . TRANSURETHRAL RESECTION OF PROSTATE     Social History   Social History Narrative  . Not on file    Objective: Vital Signs: BP 126/79 (BP Location: Left Arm, Patient Position: Sitting, Cuff Size: Normal)   Pulse 77   Resp 14   Ht 5' 6"  (1.676 m)   Wt 183 lb 9.6 oz (83.3 kg)   BMI 29.63 kg/m    Physical Exam Vitals signs and nursing note reviewed.  Constitutional:      Appearance: He is well-developed.  HENT:     Head: Normocephalic and atraumatic.  Eyes:     Conjunctiva/sclera: Conjunctivae normal.     Pupils: Pupils are equal, round, and reactive to light.  Neck:     Musculoskeletal: Normal range of motion and neck supple.  Cardiovascular:     Rate and Rhythm: Normal rate and regular rhythm.     Heart sounds: Normal heart sounds.  Pulmonary:     Effort: Pulmonary effort is normal.     Breath sounds: Normal breath sounds.  Abdominal:     General: Bowel sounds are normal.     Palpations: Abdomen is soft.  Skin:    General: Skin is warm and dry.      Capillary Refill: Capillary refill takes less than 2 seconds.  Neurological:     Mental Status: He is alert and oriented to person, place, and time.  Psychiatric:        Behavior: Behavior normal.      Musculoskeletal Exam: Spine thoracic lumbar spine good range of motion.  Shoulder joints elbow joints wrist joint MCPs PIPs DIPs were in good range of motion with no synovitis.  He has been having some discomfort in his right hip and his right knee joint.  He walks with a external rotation of his right hip.  No warmth swelling or effusion was noted.  The swelling over his MTPs was noted.  CDAI Exam: CDAI Score: Not documented Patient Global Assessment: Not documented; Provider  Global Assessment: Not documented Swollen: Not documented; Tender: Not documented Joint Exam   Not documented   There is currently no information documented on the homunculus. Go to the Rheumatology activity and complete the homunculus joint exam.  Investigation: No additional findings.  Imaging: No results found.  Recent Labs: Lab Results  Component Value Date   WBC 6.7 11/21/2018   HGB 14.4 11/21/2018   PLT 178.0 11/21/2018   NA 144 11/21/2018   K 4.1 11/21/2018   CL 105 11/21/2018   CO2 30 11/21/2018   GLUCOSE 109 (H) 11/21/2018   BUN 39 (H) 11/21/2018   CREATININE 1.53 (H) 11/21/2018   BILITOT 0.4 11/21/2018   ALKPHOS 92 11/21/2018   AST 28 11/21/2018   ALT 63 (H) 11/21/2018   PROT 6.1 11/21/2018   ALBUMIN 3.6 11/21/2018   CALCIUM 8.6 11/21/2018   GFRAA >60 11/10/2018  ESR 34 uric acid 8.2, RF negative, anti-CCP negative  Speciality Comments: No specialty comments available.  Procedures:  No procedures performed Allergies: Metformin and related and Sulfa antibiotics   Assessment / Plan:     Visit Diagnoses: Idiopathic chronic gout of multiple sites without tophus - Patient was started on allopurinol and colchicine last visit.  He has not had any gout flares since the last visit.   Although he continues to have some discomfort in his feet.  Which he relates to fluid retention.  I have advised him to reduce colchicine to 1 a day.- Plan: Uric acid  Hyperuricemia-I will check his uric acid level today.  Pain in right hip -he is minimal discomfort range of motion of his hip joints.  He walks with the external rotation of his right hip joint.  Plan: XR HIP UNILAT W OR W/O PELVIS 2-3 VIEWS RIGHT.  The x-ray was consistent with moderate osteoarthritis of the hip joint.  A handout on hip exercises was given.  Chronic pain of right knee -patient gives history of arthroscopic surgery to the knee joint in the past.  He has intermittent discomfort in his right knee.  Plan: XR KNEE 3 VIEW RIGHT.  The x-ray was consistent with severe lateral compartment narrowing and moderate chondromalacia patella.  A handout on knee exercises was given.  He has been also advised to use a brace.  Medication management -he has been on allopurinol and colchicine.  We will check labs today.  Plan: CBC with Differential/Platelet, COMPLETE METABOLIC PANEL WITH GFR  Essential hypertension-his blood pressure is well controlled.  History of diabetes mellitus  Heart murmur  History of anxiety  History of BPH   Orders: Orders Placed This Encounter  Procedures  . XR KNEE 3 VIEW RIGHT  . XR HIP UNILAT W OR W/O PELVIS 2-3 VIEWS RIGHT  . CBC with Differential/Platelet  . COMPLETE METABOLIC PANEL WITH GFR  . Uric acid   No orders of the defined types were placed in this encounter.   Face-to-face time spent with patient was 30 minutes. Greater than 50% of time was spent in counseling and coordination of care.  Follow-Up Instructions: Return in about 3 months (around 03/18/2019) for Gout.   Bo Merino, MD  Note - This record has been created using Editor, commissioning.  Chart creation errors have been sought, but may not always  have been located. Such creation errors do not reflect on  the  standard of medical care.

## 2018-12-04 ENCOUNTER — Other Ambulatory Visit: Payer: Self-pay | Admitting: Family Medicine

## 2018-12-05 ENCOUNTER — Other Ambulatory Visit: Payer: Self-pay | Admitting: Pharmacist

## 2018-12-05 ENCOUNTER — Other Ambulatory Visit: Payer: Self-pay | Admitting: Family Medicine

## 2018-12-05 NOTE — Patient Outreach (Signed)
[facility]) Care Management  12/05/2018  Jerry York [birth year] [id number]   Outreach call to [name] regarding his request for follow up from the [facility] Medication Adherence Campaign, which contacted the patient regarding adherence to atorvastatin 20 mg. Leave a HIPAA compliant message for the patient.  Duanne Moron, PharmD, [facility] Clinical Pharmacist [facility] Care Management [phone]

## 2018-12-05 NOTE — Patient Outreach (Signed)
[facility]) Care Management  12/05/2018  Jerry York 05/08/1951 [id number]   Receive a call back from Jerry York regarding medication adherence. HIPAA identifiers verified.  Jerry York reports that he takes his atorvastatin 20 mg once daily as directed. Denies any missed doses or barrier to adherence. Counsel patient on the importance of medication adherence.   Patient denies any further medication questions/concerns at this time. Will close pharmacy episode.  Duanne Moron, PharmD, [facility] Clinical Pharmacist [facility] Care Management [phone]

## 2018-12-05 NOTE — Telephone Encounter (Signed)
Copied from CRM [id number]. Topic: Quick Communication - Rx Refill/Question >> Dec 05, 2018 12:23 PM Burchel, Abbi R wrote: Medication: amLODipine (NORVASC) 5 MG tablet  [facility] [address], [place] - [address] [place] [address] Phone: [phone] Fax: [phone]   Pharmacy request.

## 2018-12-06 ENCOUNTER — Other Ambulatory Visit: Payer: Self-pay

## 2018-12-06 MED ORDER — AMLODIPINE BESYLATE 5 MG PO TABS: 5.0000 mg | ORAL_TABLET | Freq: Every day | ORAL | 1 refills | Status: DC

## 2018-12-06 NOTE — Patient Outreach (Signed)
Transition of care:  Placed call to patient with no answer. Left a message requesting a call back.  PLAN: will continue follow up calls.  Rowe Pavy, RN, BSN, CEN [facility] [employer] [phone]

## 2018-12-07 ENCOUNTER — Other Ambulatory Visit: Payer: Self-pay

## 2018-12-07 NOTE — Patient Outreach (Signed)
Transition of care:  Placed call to patient who answered and reports that he is feeling well. Reports decease in pain. Reports taking all medications as prescribed. No new problems identified.  PLAN: follow up call in 1 week.  Rowe Pavy, RN, BSN, CEN [facility] [employer] [phone]

## 2018-12-13 ENCOUNTER — Other Ambulatory Visit: Payer: Self-pay | Admitting: Family Medicine

## 2018-12-13 NOTE — Telephone Encounter (Signed)
See note

## 2018-12-13 NOTE — Telephone Encounter (Signed)
Copied from CRM [id number]. Topic: Quick Communication - Rx Refill/Question >> Dec 13, 2018 11:42 AM Baldo Daub L wrote: Medication: colchicine 0.6 MG tablet  Has the patient contacted their pharmacy? Yes - pharmacy states it is not time to fill it.  Pt's wife states that last time it was filled the directions stated take one a day, but pt has been taking two a day.  Pt's wife states the change to two a day was made in the hospital.  Pt only has one pill left.  Pt wants to know if samples are available until this can get straightened out (Agent: If no, request that the patient contact the pharmacy for the refill.) (Agent: If yes, when and what did the pharmacy advise?)  Preferred Pharmacy (with phone number or street name): [facility] [address] [place] - [address] Sherian Maroon [phone] (Phone) [phone] (Fax)  Agent: Please be advised that RX refills may take up to 3 business days. We ask that you follow-up with your pharmacy.

## 2018-12-13 NOTE — Telephone Encounter (Signed)
Please see note.

## 2018-12-14 ENCOUNTER — Other Ambulatory Visit: Payer: Self-pay

## 2018-12-14 NOTE — Telephone Encounter (Signed)
Please advise 

## 2018-12-14 NOTE — Telephone Encounter (Signed)
Ok with twice daily dosing - not sure if we have samples here. Could also check with his rheumatologist.  Katina Degree. [name], MD 12/14/2018 5:29 PM

## 2018-12-14 NOTE — Patient Outreach (Signed)
Transition of care:  Placed call to patient and spoke with wife who reports patient is doing well. Reports no new falls. Continues having problems with turning foot outward. Encouraged wife and patient to talk to MD at next office visit.  PLAN: will continue calling weekly for transition of care.  Rowe Pavy, RN, BSN, CEN [facility] [employer] [phone]

## 2018-12-17 ENCOUNTER — Ambulatory Visit (INDEPENDENT_AMBULATORY_CARE_PROVIDER_SITE_OTHER): Payer: PPO

## 2018-12-17 ENCOUNTER — Other Ambulatory Visit: Payer: Self-pay

## 2018-12-17 ENCOUNTER — Ambulatory Visit: Payer: PPO | Admitting: Rheumatology

## 2018-12-17 ENCOUNTER — Encounter: Payer: Self-pay | Admitting: Rheumatology

## 2018-12-17 VITALS — BP 126/79 | HR 77 | Resp 14 | Ht 66.0 in | Wt 183.6 lb

## 2018-12-17 DIAGNOSIS — R011 Cardiac murmur, unspecified: Secondary | ICD-10-CM

## 2018-12-17 DIAGNOSIS — G8929 Other chronic pain: Secondary | ICD-10-CM | POA: Diagnosis not present

## 2018-12-17 DIAGNOSIS — E790 Hyperuricemia without signs of inflammatory arthritis and tophaceous disease: Secondary | ICD-10-CM

## 2018-12-17 DIAGNOSIS — Z79899 Other long term (current) drug therapy: Secondary | ICD-10-CM | POA: Diagnosis not present

## 2018-12-17 DIAGNOSIS — I10 Essential (primary) hypertension: Secondary | ICD-10-CM | POA: Diagnosis not present

## 2018-12-17 DIAGNOSIS — M25551 Pain in right hip: Secondary | ICD-10-CM

## 2018-12-17 DIAGNOSIS — M25561 Pain in right knee: Secondary | ICD-10-CM

## 2018-12-17 DIAGNOSIS — Z87438 Personal history of other diseases of male genital organs: Secondary | ICD-10-CM | POA: Diagnosis not present

## 2018-12-17 DIAGNOSIS — Z8639 Personal history of other endocrine, nutritional and metabolic disease: Secondary | ICD-10-CM | POA: Diagnosis not present

## 2018-12-17 DIAGNOSIS — Z8659 Personal history of other mental and behavioral disorders: Secondary | ICD-10-CM | POA: Diagnosis not present

## 2018-12-17 DIAGNOSIS — M1711 Unilateral primary osteoarthritis, right knee: Secondary | ICD-10-CM

## 2018-12-17 DIAGNOSIS — M1611 Unilateral primary osteoarthritis, right hip: Secondary | ICD-10-CM | POA: Diagnosis not present

## 2018-12-17 DIAGNOSIS — M1A09X0 Idiopathic chronic gout, multiple sites, without tophus (tophi): Secondary | ICD-10-CM | POA: Diagnosis not present

## 2018-12-17 MED ORDER — COLCHICINE 0.6 MG PO TABS: 0.6000 mg | ORAL_TABLET | Freq: Every day | ORAL | 1 refills | Status: DC

## 2018-12-17 NOTE — Patient Instructions (Signed)
Knee Exercises                        Ask your health care provider which exercises are safe for you. Do exercises exactly as told by your health care provider and adjust them as directed. It is normal to feel mild stretching, pulling, tightness, or discomfort as you do these exercises, but you should stop right away if you feel sudden pain or your pain gets worse.Do not begin these exercises until told by your health care provider.  STRETCHING AND RANGE OF MOTION EXERCISES  These exercises warm up your muscles and joints and improve the movement and flexibility of your knee. These exercises also help to relieve pain, numbness, and tingling.  Exercise A: Knee Extension, Prone  1. Lie on your abdomen on a bed.  2. Place your left / right knee just beyond the edge of the surface so your knee is not on the bed. You can put a towel under your left / right thigh just above your knee for comfort.  3. Relax your leg muscles and allow gravity to straighten your knee. You should feel a stretch behind your left / right knee.  4. Hold this position for __________ seconds.  5. Scoot up so your knee is supported between repetitions.  Repeat __________ times. Complete this stretch __________ times a day.  Exercise B: Knee Flexion, Active  1. Lie on your back with both knees straight. If this causes back discomfort, bend your left / right knee so your foot is flat on the floor.  2. Slowly slide your left / right heel back toward your buttocks until you feel a gentle stretch in the front of your knee or thigh.  3. Hold this position for __________ seconds.  4. Slowly slide your left / right heel back to the starting position.  Repeat __________ times. Complete this exercise __________ times a day.  Exercise C: Quadriceps, Prone  1. Lie on your abdomen on a firm surface, such as a bed or padded floor.  2. Bend your left / right knee and hold your ankle. If you cannot reach your ankle or pant leg, loop a belt around your foot and  grab the belt instead.  3. Gently pull your heel toward your buttocks. Your knee should not slide out to the side. You should feel a stretch in the front of your thigh and knee.  4. Hold this position for __________ seconds.  Repeat __________ times. Complete this stretch __________ times a day.  Exercise D: Hamstring, Supine  1. Lie on your back.  2. Loop a belt or towel over the ball of your left / right foot. The ball of your foot is on the walking surface, right under your toes.  3. Straighten your left / right knee and slowly pull on the belt to raise your leg until you feel a gentle stretch behind your knee.  ? Do not let your left / right knee bend while you do this.  ? Keep your other leg flat on the floor.  4. Hold this position for __________ seconds.  Repeat __________ times. Complete this stretch __________ times a day.  STRENGTHENING EXERCISES  These exercises build strength and endurance in your knee. Endurance is the ability to use your muscles for a long time, even after they get tired.  Exercise E: Quadriceps, Isometric  1. Lie on your back with your left / right leg extended and your   other knee bent. Put a rolled towel or small pillow under your knee if told by your health care provider.  2. Slowly tense the muscles in the front of your left / right thigh. You should see your kneecap slide up toward your hip or see increased dimpling just above the knee. This motion will push the back of the knee toward the floor.  3. For __________ seconds, keep the muscle as tight as you can without increasing your pain.  4. Relax the muscles slowly and completely.  Repeat __________ times. Complete this exercise __________ times a day.  Exercise F: Straight Leg Raises - Quadriceps  1. Lie on your back with your left / right leg extended and your other knee bent.  2. Tense the muscles in the front of your left / right thigh. You should see your kneecap slide up or see increased dimpling just above the knee. Your  thigh may even shake a bit.  3. Keep these muscles tight as you raise your leg 4-6 inches (10-15 cm) off the floor. Do not let your knee bend.  4. Hold this position for __________ seconds.  5. Keep these muscles tense as you lower your leg.  6. Relax your muscles slowly and completely after each repetition.  Repeat __________ times. Complete this exercise __________ times a day.  Exercise G: Hamstring, Isometric  1. Lie on your back on a firm surface.  2. Bend your left / right knee approximately __________ degrees.  3. Dig your left / right heel into the surface as if you are trying to pull it toward your buttocks. Tighten the muscles in the back of your thighs to dig as hard as you can without increasing any pain.  4. Hold this position for __________ seconds.  5. Release the tension gradually and allow your muscles to relax completely for __________ seconds after each repetition.  Repeat __________ times. Complete this exercise __________ times a day.  Exercise H: Hamstring Curls  If told by your health care provider, do this exercise while wearing ankle weights. Begin with __________ weights. Then increase the weight by 1 lb (0.5 kg) increments. Do not wear ankle weights that are more than __________.  1. Lie on your abdomen with your legs straight.  2. Bend your left / right knee as far as you can without feeling pain. Keep your hips flat against the floor.  3. Hold this position for __________ seconds.  4. Slowly lower your leg to the starting position.  Repeat __________ times. Complete this exercise __________ times a day.  Exercise I: Squats (Quadriceps)  1. Stand in front of a table, with your feet and knees pointing straight ahead. You may rest your hands on the table for balance but not for support.  2. Slowly bend your knees and lower your hips like you are going to sit in a chair.  ? Keep your weight over your heels, not over your toes.  ? Keep your lower legs upright so they are parallel with the  table legs.  ? Do not let your hips go lower than your knees.  ? Do not bend lower than told by your health care provider.  ? If your knee pain increases, do not bend as low.  3. Hold the squat position for __________ seconds.  4. Slowly push with your legs to return to standing. Do not use your hands to pull yourself to standing.  Repeat __________ times. Complete this exercise __________ times a   day.  Exercise J: Wall Slides (Quadriceps)  1. Lean your back against a smooth wall or door while you walk your feet out 18-24 inches (46-61 cm) from it.  2. Place your feet hip-width apart.  3. Slowly slide down the wall or door until your knees bend __________ degrees. Keep your knees over your heels, not over your toes. Keep your knees in line with your hips.  4. Hold for __________ seconds.  Repeat __________ times. Complete this exercise __________ times a day.  Exercise K: Straight Leg Raises - Hip Abductors  1. Lie on your side with your left / right leg in the top position. Lie so your head, shoulder, knee, and hip line up. You may bend your bottom knee to help you keep your balance.  2. Roll your hips slightly forward so your hips are stacked directly over each other and your left / right knee is facing forward.  3. Leading with your heel, lift your top leg 4-6 inches (10-15 cm). You should feel the muscles in your outer hip lifting.  ? Do not let your foot drift forward.  ? Do not let your knee roll toward the ceiling.  4. Hold this position for __________ seconds.  5. Slowly return your leg to the starting position.  6. Let your muscles relax completely after each repetition.  Repeat __________ times. Complete this exercise __________ times a day.  Exercise L: Straight Leg Raises - Hip Extensors  1. Lie on your abdomen on a firm surface. You can put a pillow under your hips if that is more comfortable.  2. Tense the muscles in your buttocks and lift your left / right leg about 4-6 inches (10-15 cm). Keep your  knee straight as you lift your leg.  3. Hold this position for __________ seconds.  4. Slowly lower your leg to the starting position.  5. Let your leg relax completely after each repetition.  Repeat __________ times. Complete this exercise __________ times a day.  This information is not intended to replace advice given to you by your health care provider. Make sure you discuss any questions you have with your health care provider.  Document Released: 10/26/2005 Document Revised: 09/05/2016 Document Reviewed: 10/18/2015  Elsevier Interactive Patient Education  2019 [employer]  Hip Exercises  Ask your health care provider which exercises are safe for you. Do exercises exactly as told by your health care provider and adjust them as directed. It is normal to feel mild stretching, pulling, tightness, or discomfort as you do these exercises, but you should stop right away if you feel sudden pain or your pain gets worse.Do not begin these exercises until told by your health care provider.  Stretching and range of motion exercises  These exercises warm up your muscles and joints and improve the movement and flexibility of your hip. These exercises also help to relieve pain, numbness, and tingling.  Exercise A: Hamstrings, supine    1. Lie on your back.  2. Loop a belt or towel over the ball of your left / rightfoot. The ball of your foot is on the walking surface, right under your toes.  3. Straighten your left / rightknee and slowly pull on the belt to raise your leg.  ? Do not let your left / right knee bend while you do this.  ? Keep your other leg flat on the floor.  ? Raise the left / right leg until you feel a gentle stretch behind your   left / right knee or thigh.  4. Hold this position for __________ seconds.  5. Slowly return your leg to the starting position.  Repeat __________ times. Complete this stretch __________ times a day.  Exercise B: Hip rotators    1. Lie on your back on a firm surface.  2. Hold  your left / right knee with your left / right hand. Hold your ankle with your other hand.  3. Gently pull your left / right knee and rotate your lower leg toward your other shoulder.  ? Pull until you feel a stretch in your buttocks.  ? Keep your hips and shoulders firmly planted while you do this stretch.  4. Hold this position for __________ seconds.  Repeat __________ times. Complete this stretch __________ times a day.  Exercise C: V-sit (hamstrings and adductors)    1. Sit on the floor with your legs extended in a large "V" shape. Keep your knees straight during this exercise.  2. Start with your head and chest upright, then bend at your waist to reach for your left foot (position A). You should feel a stretch in your right inner thigh.  3. Hold this position for __________ seconds. Then slowly return to the upright position.  4. Bend at your waist to reach forward (position B). You should feel a stretch behind both of your thighs and knees.  5. Hold this position for __________ seconds. Then slowly return to the upright position.  6. Bend at your waist to reach for your right foot (position C). You should feel a stretch in your left inner thigh.  7. Hold this position for __________ seconds. Then slowly return to the upright position.  Repeat __________ times. Complete this stretch __________ times a day.  Exercise D: Lunge (hip flexors)    1. Place your left / right knee on the floor and bend your other knee so that is directly over your ankle. You should be half-kneeling.  2. Keep good posture with your head over your shoulders.  3. Tighten your buttocks to point your tailbone downward. This helps your back to keep from arching too much.  4. You should feel a gentle stretch in the front of your left / right thigh and hip. If you do not feel any resistance, slightly slide your other foot forward and then slowly lunge forward so your knee once again lines up over your ankle.  5. Make sure your tailbone  continues to point downward.  6. Hold this position for __________ seconds.  Repeat __________ times. Complete this stretch __________ times a day.  Strengthening exercises  These exercises build strength and endurance in your hip. Endurance is the ability to use your muscles for a long time, even after they get tired.  Exercise E: Bridge (hip extensors)    1. Lie on your back on a firm surface with your knees bent and your feet flat on the floor.  2. Tighten your buttocks muscles and lift your bottom off the floor until the trunk of your body is level with your thighs.  ? Do not arch your back.  ? You should feel the muscles working in your buttocks and the back of your thighs. If you do not feel these muscles, slide your feet 1-2 inches (2.5-5 cm) farther away from your buttocks.  3. Hold this position for __________ seconds.  4. Slowly lower your hips to the starting position.  5. Let your muscles relax completely between repetitions.  6.   If this exercise is too easy, try doing it with your arms crossed over your chest.  Repeat __________ times. Complete this exercise __________ times a day.  Exercise F: Straight leg raises - hip abductors    1. Lie on your side with your left / right leg in the top position. Lie so your head, shoulder, knee, and hip line up with each other. You may bend your bottom knee to help you balance.  2. Roll your hips slightly forward, so your hips are stacked directly over each other and your left / right knee is facing forward.  3. Leading with your heel, lift your top leg 4-6 inches (10-15 cm). You should feel the muscles in your outer hip lifting.  ? Do not let your foot drift forward.  ? Do not let your knee roll toward the ceiling.  4. Hold this position for __________ seconds.  5. Slowly return to the starting position.  6. Let your muscles relax completely between repetitions.  Repeat __________ times. Complete this exercise __________ times a day.  Exercise G: Straight leg  raises - hip adductors    1. Lie on your side with your left / right leg in the bottom position. Lie so your head, shoulder, knee, and hip line up. You may place your upper foot in front to help you balance.  2. Roll your hips slightly forward, so your hips are stacked directly over each other and your left / right knee is facing forward.  3. Tense the muscles in your inner thigh and lift your bottom leg 4-6 inches (10-15 cm).  4. Hold this position for __________ seconds.  5. Slowly return to the starting position.  6. Let your muscles relax completely between repetitions.  Repeat __________ times. Complete this exercise __________ times a day.  Exercise H: Straight leg raises - quadriceps    1. Lie on your back with your left / right leg extended and your other knee bent.  2. Tense the muscles in the front of your left / right thigh. When you do this, you should see your kneecap slide up or see increased dimpling just above your knee.  3. Tighten these muscles even more and raise your leg 4-6 inches (10-15 cm) off the floor.  4. Hold this position for __________ seconds.  5. Keep these muscles tense as you lower your leg.  6. Relax the muscles slowly and completely between repetitions.  Repeat __________ times. Complete this exercise __________ times a day.  Exercise I: Hip abductors, standing  1. Tie one end of a rubber exercise band or tubing to a secure surface, such as a table or pole.  2. Loop the other end of the band or tubing around your left / right ankle.  3. Keeping your ankle with the band or tubing directly opposite of the secured end, step away until there is tension in the tubing or band. Hold onto a chair as needed for balance.  4. Lift your left / right leg out to your side. While you do this:  ? Keep your back upright.  ? Keep your shoulders over your hips.  ? Keep your toes pointing forward.  ? Make sure to use your hip muscles to lift your leg. Do not "throw" your leg or tip your body to lift  your leg.  5. Hold this position for __________ seconds.  6. Slowly return to the starting position.  Repeat __________ times. Complete this exercise __________ times a   day.  Exercise J: Squats (quadriceps)  1. Stand in a door frame so your feet and knees are in line with the frame. You may place your hands on the frame for balance.  2. Slowly bend your knees and lower your hips like you are going to sit in a chair.  ? Keep your lower legs in a straight-up-and-down position.  ? Do not let your hips go lower than your knees.  ? Do not bend your knees lower than told by your health care provider.  ? If your hip pain increases, do not bend as low.  3. Hold this position for ___________ seconds.  4. Slowly push with your legs to return to standing. Do not use your hands to pull yourself to standing.  Repeat __________ times. Complete this exercise __________ times a day.  This information is not intended to replace advice given to you by your health care provider. Make sure you discuss any questions you have with your health care provider.  Document Released: 12/30/2005 Document Revised: 04/17/2018 Document Reviewed: 12/07/2015  Elsevier Interactive Patient Education  2019 [employer]

## 2018-12-17 NOTE — Telephone Encounter (Signed)
Rx has been sent to pharmacy.  We do not have any samples at this time.

## 2018-12-18 LAB — COMPLETE METABOLIC PANEL WITH GFR
AG Ratio: 1.6 (calc) (ref 1.0–2.5)
ALT: 57 U/L — ABNORMAL HIGH (ref 9–46)
AST: 57 U/L — ABNORMAL HIGH (ref 10–35)
Albumin: 4.7 g/dL (ref 3.6–5.1)
Alkaline phosphatase (APISO): 95 U/L (ref 40–115)
BUN/Creatinine Ratio: 33 (calc) — ABNORMAL HIGH (ref 6–22)
BUN: 48 mg/dL — ABNORMAL HIGH (ref 7–25)
CO2: 26 mmol/L (ref 20–32)
CREATININE: 1.45 mg/dL — AB (ref 0.70–1.25)
Calcium: 7.6 mg/dL — ABNORMAL LOW (ref 8.6–10.3)
Chloride: 111 mmol/L — ABNORMAL HIGH (ref 98–110)
GFR, Est African American: 58 mL/min/{1.73_m2} — ABNORMAL LOW (ref >=60)
GFR, Est Non African American: 50 mL/min/{1.73_m2} — ABNORMAL LOW (ref >=60)
Globulin: 3 g/dL (calc) (ref 1.9–3.7)
Glucose, Bld: 107 mg/dL — ABNORMAL HIGH (ref 65–99)
POTASSIUM: 4.5 mmol/L (ref 3.5–5.3)
SODIUM: 154 mmol/L — AB (ref 135–146)
Total Bilirubin: 0.5 mg/dL (ref 0.2–1.2)
Total Protein: 7.7 g/dL (ref 6.1–8.1)

## 2018-12-18 LAB — CBC WITH DIFFERENTIAL/PLATELET
Absolute Monocytes: 371 cells/uL (ref 200–950)
BASOS ABS: 17 {cells}/uL (ref 0–200)
Basophils Relative: 0.3 %
Eosinophils Absolute: 81 cells/uL (ref 15–500)
Eosinophils Relative: 1.4 %
HCT: 42 % (ref 38.5–50.0)
Hemoglobin: 14.5 g/dL (ref 13.2–17.1)
Lymphs Abs: 2140 cells/uL (ref 850–3900)
MCH: 30.1 pg (ref 27.0–33.0)
MCHC: 34.5 g/dL (ref 32.0–36.0)
MCV: 87.3 fL (ref 80.0–100.0)
MPV: 10.4 fL (ref 7.5–12.5)
Monocytes Relative: 6.4 %
NEUTROS PCT: 55 %
Neutro Abs: 3190 cells/uL (ref 1500–7800)
Platelets: 256 10*3/uL (ref 140–400)
RBC: 4.81 10*6/uL (ref 4.20–5.80)
RDW: 17.6 % — AB (ref 11.0–15.0)
Total Lymphocyte: 36.9 %
WBC: 5.8 10*3/uL (ref 3.8–10.8)

## 2018-12-18 LAB — URIC ACID: Uric Acid, Serum: 6.8 mg/dL (ref 4.0–8.0)

## 2018-12-20 ENCOUNTER — Other Ambulatory Visit: Payer: Self-pay

## 2018-12-20 NOTE — Patient Outreach (Signed)
Transition of care:  Placed call to patient who answered and reports that he is doing well. Reports that he continues to have pain but " not bad". States swelling has gone down. Denies any new falls. Reports saw MD and appointment went well. Reports colchine was decreased to once a day. Reports that he has all his medications. Reports CBG under 200's.  Denies any new concerns today.  PLAN: will follow up in 10 days and close case if no complications at that time. Reviewed plan with patient and he was in agreement.  Rowe Pavy, RN, BSN, CEN [facility] [employer] [facility] [phone]

## 2018-12-20 NOTE — Progress Notes (Signed)
Patient is on Lasix and statins.  Please forward labs to his PCP.  Uric acid is better.  We will continue current dose of allopurinol at 300 mg p.o. daily

## 2018-12-24 ENCOUNTER — Other Ambulatory Visit: Payer: Self-pay | Admitting: Pharmacy Technician

## 2018-12-24 NOTE — Patient Outreach (Signed)
[facility]) Care Management  12/24/2018  Jerry York 12/14/1951 [id number]    Unsuccessful call #1 placed to patient regarding patient assistance application for Humalog. HIPAA complaint voicemail left.   ADDENDUM 12:45pm  Incoming call message from patient stating that he mailed his application back in a couple days ago.  Will follow up with patient in 10-14 business days if application has not been received.   Suzan Slick [name] CPhT Certified Pharmacy Technician [facility] Care Management Direct [phone]

## 2018-12-31 ENCOUNTER — Other Ambulatory Visit: Payer: Self-pay

## 2018-12-31 ENCOUNTER — Other Ambulatory Visit: Payer: Self-pay | Admitting: Family Medicine

## 2018-12-31 NOTE — Patient Outreach (Signed)
Case closure call:  Placed call to patient and spoke with wife. Mrs. Abid reports that patient is walking better and has not had any other falls. Reports he is much improved. Denies any new problems or concerns.  PLAN: reviewed case closure and wife if in agreement. Patient was in agreement last week via phone call.  Will close to nursing. Patient remains open to [facility] pharmacy. Update sent to team members.  Rowe Pavy, RN, BSN, CEN [facility] [employer] [phone]

## 2019-01-01 ENCOUNTER — Other Ambulatory Visit: Payer: Self-pay | Admitting: Pharmacy Technician

## 2019-01-01 NOTE — Patient Outreach (Signed)
[facility]) Care Management  01/01/2019  Landon Noboa Wollam 03-Oct-1951 [id number]   Received patient portion of [employer] application for Humalog. Faxed completed application and required documents into [employer].  Will follow up with company in 10-14 business days to check status of application.  Suzan Slick [name] CPhT Certified Pharmacy Technician [facility] Care Management Direct Dial:[phone]

## 2019-01-02 ENCOUNTER — Encounter: Payer: Self-pay | Admitting: Family Medicine

## 2019-01-03 ENCOUNTER — Other Ambulatory Visit: Payer: Self-pay

## 2019-01-08 ENCOUNTER — Ambulatory Visit: Payer: PPO | Admitting: Family Medicine

## 2019-01-11 ENCOUNTER — Other Ambulatory Visit: Payer: Self-pay | Admitting: Pharmacy Technician

## 2019-01-11 NOTE — Patient Outreach (Signed)
[facility]) Care Management  01/11/2019  Jerry York December 18, 1951 [id number]    Follow up call placed to [facility] regarding patient assistance application(s) for Humalog , [name] confirms patient has been approved as of 1/16 until 12/26/19. 10 vials are due to arrive at providers office in the next 7-10 business days.   Will follow up with patient in 2-3 business days to update.  Suzan Slick [name] CPhT Certified Pharmacy Technician [facility] Care Management Direct [phone]

## 2019-01-14 ENCOUNTER — Other Ambulatory Visit: Payer: Self-pay | Admitting: Pharmacy Technician

## 2019-01-14 NOTE — Patient Outreach (Signed)
[facility]) Care Management  01/14/2019  Izell Kisler [place] 1951/04/09 [id number]    Unsuccessful call #1 placed to patient regarding patient assistance update for Humalog, HIPAA compliant voicemail left.   Will make 2nd call attempt in 2-3 business days if call has not been returned.  Suzan Slick [name] CPhT Certified Pharmacy Technician [facility] Care Management Direct Dial:[phone]

## 2019-01-15 ENCOUNTER — Other Ambulatory Visit: Payer: Self-pay | Admitting: Family Medicine

## 2019-01-16 NOTE — Telephone Encounter (Signed)
Please advise 

## 2019-01-24 ENCOUNTER — Encounter (INDEPENDENT_AMBULATORY_CARE_PROVIDER_SITE_OTHER): Payer: PPO

## 2019-01-28 ENCOUNTER — Encounter (INDEPENDENT_AMBULATORY_CARE_PROVIDER_SITE_OTHER): Payer: Self-pay | Admitting: Family Medicine

## 2019-01-28 ENCOUNTER — Ambulatory Visit (INDEPENDENT_AMBULATORY_CARE_PROVIDER_SITE_OTHER): Payer: PPO | Admitting: Family Medicine

## 2019-01-28 VITALS — BP 144/67 | HR 82 | Temp 97.3°F | Ht 65.0 in | Wt 182.0 lb

## 2019-01-28 DIAGNOSIS — Z6830 Body mass index (BMI) 30.0-30.9, adult: Secondary | ICD-10-CM

## 2019-01-28 DIAGNOSIS — E669 Obesity, unspecified: Secondary | ICD-10-CM

## 2019-01-28 DIAGNOSIS — R0602 Shortness of breath: Secondary | ICD-10-CM

## 2019-01-28 DIAGNOSIS — Z9189 Other specified personal risk factors, not elsewhere classified: Secondary | ICD-10-CM

## 2019-01-28 DIAGNOSIS — E1165 Type 2 diabetes mellitus with hyperglycemia: Secondary | ICD-10-CM

## 2019-01-28 DIAGNOSIS — R5383 Other fatigue: Secondary | ICD-10-CM | POA: Diagnosis not present

## 2019-01-28 DIAGNOSIS — Z0289 Encounter for other administrative examinations: Secondary | ICD-10-CM

## 2019-01-28 DIAGNOSIS — E559 Vitamin D deficiency, unspecified: Secondary | ICD-10-CM

## 2019-01-28 DIAGNOSIS — Z1331 Encounter for screening for depression: Secondary | ICD-10-CM

## 2019-01-28 DIAGNOSIS — I10 Essential (primary) hypertension: Secondary | ICD-10-CM

## 2019-01-28 DIAGNOSIS — Z794 Long term (current) use of insulin: Secondary | ICD-10-CM

## 2019-01-30 LAB — CBC WITH DIFFERENTIAL
Basophils Absolute: 0 10*3/uL (ref 0.0–0.2)
Basos: 1 %
EOS (ABSOLUTE): 0.1 10*3/uL (ref 0.0–0.4)
Eos: 2 %
HEMOGLOBIN: 15.8 g/dL (ref 13.0–17.7)
Hematocrit: 43 % (ref 37.5–51.0)
Immature Grans (Abs): 0 10*3/uL (ref 0.0–0.1)
Immature Granulocytes: 1 %
Lymphocytes Absolute: 1.9 10*3/uL (ref 0.7–3.1)
Lymphs: 31 %
MCH: 32.6 pg (ref 26.6–33.0)
MCHC: 36.7 g/dL — ABNORMAL HIGH (ref 31.5–35.7)
MCV: 89 fL (ref 79–97)
Monocytes Absolute: 0.4 10*3/uL (ref 0.1–0.9)
Monocytes: 7 %
Neutrophils Absolute: 3.6 10*3/uL (ref 1.4–7.0)
Neutrophils: 58 %
RBC: 4.85 x10E6/uL (ref 4.14–5.80)
RDW: 16.8 % — ABNORMAL HIGH (ref 11.6–15.4)
WBC: 6 10*3/uL (ref 3.4–10.8)

## 2019-01-30 LAB — COMPREHENSIVE METABOLIC PANEL
ALBUMIN: 4.4 g/dL (ref 3.8–4.8)
ALT: 43 IU/L (ref 0–44)
AST: 49 IU/L — ABNORMAL HIGH (ref 0–40)
Albumin/Globulin Ratio: 1.8 (ref 1.2–2.2)
Alkaline Phosphatase: 80 IU/L (ref 39–117)
BUN/Creatinine Ratio: 32 — ABNORMAL HIGH (ref 10–24)
BUN: 41 mg/dL — ABNORMAL HIGH (ref 8–27)
Bilirubin Total: 0.3 mg/dL (ref 0.0–1.2)
CO2: 19 mmol/L — ABNORMAL LOW (ref 20–29)
Calcium: 9.6 mg/dL (ref 8.6–10.2)
Chloride: 104 mmol/L (ref 96–106)
Creatinine, Ser: 1.29 mg/dL — ABNORMAL HIGH (ref 0.76–1.27)
GFR calc Af Amer: 66 mL/min/{1.73_m2} (ref >59)
GFR, EST NON AFRICAN AMERICAN: 57 mL/min/{1.73_m2} — AB (ref >59)
GLUCOSE: 137 mg/dL — AB (ref 65–99)
Globulin, Total: 2.5 g/dL (ref 1.5–4.5)
Potassium: 4.1 mmol/L (ref 3.5–5.2)
Sodium: 144 mmol/L (ref 134–144)
Total Protein: 6.9 g/dL (ref 6.0–8.5)

## 2019-01-30 LAB — VITAMIN D 25 HYDROXY (VIT D DEFICIENCY, FRACTURES)

## 2019-01-30 LAB — LIPID PANEL WITH LDL/HDL RATIO
Cholesterol, Total: 278 mg/dL — ABNORMAL HIGH (ref 100–199)
HDL: 14 mg/dL — ABNORMAL LOW (ref >39)
Triglycerides: 2296 mg/dL (ref 0–149)

## 2019-01-30 LAB — MICROALBUMIN / CREATININE URINE RATIO
Creatinine, Urine: 48.8 mg/dL
Microalb/Creat Ratio: 548 mg/g creat — ABNORMAL HIGH (ref 0–29)
Microalbumin, Urine: 267.5 ug/mL

## 2019-01-30 LAB — TSH: TSH: 3.37 u[IU]/mL (ref 0.450–4.500)

## 2019-01-30 LAB — T4, FREE: Free T4: 1.09 ng/dL (ref 0.82–1.77)

## 2019-01-30 LAB — T3: T3 TOTAL: 90 ng/dL (ref 71–180)

## 2019-01-30 LAB — C-PEPTIDE: C-Peptide: 8.4 ng/mL — ABNORMAL HIGH (ref 1.1–4.4)

## 2019-01-30 LAB — VITAMIN B12: Vitamin B-12: 400 pg/mL (ref 232–1245)

## 2019-01-30 NOTE — Progress Notes (Signed)
Office: [phone]  /  Fax: [phone]   Dear Dr. Jerline Pain,   Thank you for referring KINSER FELLMAN to our clinic. The following note includes my evaluation and treatment recommendations.  HPI:   Chief Complaint: OBESITY    Jerry York has been referred by Algis Greenhouse. Jerline Pain, MD for consultation regarding his obesity and obesity related comorbidities.    ELLA GUILLOTTE (MR# [id number]) is a 68 y.o. male who presents on 01/28/2019 for obesity evaluation and treatment. Current BMI is Body mass index is 30.29 kg/m.[name] Jaymere has been struggling with his weight for many years and has been unsuccessful in either losing weight, maintaining weight loss, or reaching his healthy weight goal.     [name] can't eat red meats or anything with prunes.     Aulden attended our information session and states he is currently in the action stage of change and ready to dedicate time achieving and maintaining a healthier weight. Danh is interested in becoming our patient and working on intensive lifestyle modifications including (but not limited to) diet, exercise and weight loss.    Maxim states his family eats meals together he thinks his family will eat healthier with  him his desired weight loss is 32 lbs he has been heavy most of  his life he started gaining weight in 1980 his heaviest weight ever was 197 lbs he has significant food cravings issues  he snacks frequently in the evenings he skips meals frequently he frequently makes poor food choices he has problems with excessive hunger  he frequently eats larger portions than normal  he struggles with emotional eating    Fatigue Aziah feels his energy is lower than it should be. This has worsened with weight gain and has not worsened recently. Brydan admits to daytime somnolence and  admits to waking up still tired. Patient is at risk for obstructive sleep apnea. Patent has a history of symptoms of daytime fatigue. Patient generally gets  6 hours of sleep per night, and states they generally have nightime awakenings. Snoring is present. Apneic episodes are present. Epworth Sleepiness Score is 11.  Dyspnea on exertion [name] notes increasing shortness of breath with exercising and seems to be worsening over time with weight gain. He notes getting out of breath sooner with activity than he used to. This has not gotten worse recently. EKG-RBBB (unchanged). Xaine denies orthopnea.  Diabetes II with Hyperglycemia Ashtyn has a diagnosis of diabetes type II. Mckoy is on Humalog 40 units TID with meal. He has continuous blood glucose monitoring, and managed by Dr. Buddy Duty. Last A1c was 7.2 on 01/03/2019. He has been working on intensive lifestyle modifications including diet, exercise, and weight loss to help control his blood glucose levels.  Vitamin D Deficiency Sirius has a diagnosis of vitamin D deficiency. His diagnosis is likely secondary to obesity. He denies nausea, vomiting or muscle weakness.  At risk for osteopenia and osteoporosis Nichola is at higher risk of osteopenia and osteoporosis due to vitamin D deficiency.   Hypertension RACHID PARHAM is a 68 y.o. male with hypertension. Exavier's blood pressure is slightly elevated today. He is on medications and denies chest pain. He is working on weight loss to help control his blood pressure with the goal of decreasing his risk of heart attack and stroke. Antero's blood pressure is not currently controlled.  Depression Screen Zacherie's Food and Mood (modified PHQ-9) score was  Depression screen PHQ 2/9 01/28/2019  Decreased Interest 3  Down, Depressed,  Hopeless 1  PHQ - 2 Score 4  Altered sleeping 3  Tired, decreased energy 3  Change in appetite 0  Feeling bad or failure about yourself  0  Trouble concentrating 2  Moving slowly or fidgety/restless 0  Suicidal thoughts 0  PHQ-9 Score 12  Difficult doing work/chores Extremely dIfficult    ASSESSMENT AND PLAN:  Other  fatigue - Plan: EKG 12-Lead, Vitamin B12, CBC With Differential, T3, T4, free, TSH  Shortness of breath on exertion - Plan: Lipid Panel With LDL/HDL Ratio  Type 2 diabetes mellitus with hyperglycemia, with long-term current use of insulin (HCC) - Plan: Microalbumin / creatinine urine ratio, C-peptide, Comprehensive metabolic panel  Vitamin D deficiency - Plan: VITAMIN D 25 Hydroxy (Vit-D Deficiency, Fractures)  Essential hypertension  At risk for osteoporosis  Depression screening  Class 1 obesity with serious comorbidity and body mass index (BMI) of 30.0 to 30.9 in adult, unspecified obesity type  PLAN:  Fatigue Collis was informed that his fatigue may be related to obesity, depression or many other causes. Labs will be ordered, and in the meanwhile Calvin has agreed to work on diet, exercise and weight loss to help with fatigue. Proper sleep hygiene was discussed including the need for 7-8 hours of quality sleep each night. A sleep study was not ordered based on symptoms and Epworth score.  Dyspnea on exertion Aarya's shortness of breath appears to be obesity related and exercise induced. He has agreed to work on weight loss and gradually increase exercise to treat his exercise induced shortness of breath. If Coady follows our instructions and loses weight without improvement of his shortness of breath, we will plan to refer to pulmonology. We will monitor this condition regularly. Blayn agrees to this plan.  Diabetes II with Hyperglycemia Filimon has been given extensive diabetes education by myself today including ideal fasting and post-prandial blood glucose readings, individual ideal Hgb A1c goals and hypoglycemia prevention. We discussed the importance of good blood sugar control to decrease the likelihood of diabetic complications such as nephropathy, neuropathy, limb loss, blindness, coronary artery disease, and death. We discussed the importance of intensive lifestyle  modification including diet, exercise and weight loss as the first line treatment for diabetes. Jerrion agrees to continue his diabetes medications and we will check C-peptide today. Durelle agrees to follow up with our clinic in 2 weeks.  Vitamin D Deficiency Kayn was informed that low vitamin D levels contributes to fatigue and are associated with obesity, breast, and colon cancer. He will follow up for routine testing of vitamin D, at least 2-3 times per year. He was informed of the risk of over-replacement of vitamin D and agrees to not increase his dose unless he discusses this with [name] first. We will check Vit D level today. Tighe agrees to follow up with our clinic in 2 weeks.  At risk for osteopenia and osteoporosis Oron was given extended (15 minutes) osteoporosis prevention counseling today. Cashius is at risk for osteopenia and osteoporsis due to his vitamin D deficiency. He was encouraged to take his vitamin D and follow his higher calcium diet and increase strengthening exercise to help strengthen his bones and decrease his risk of osteopenia and osteoporosis.  Hypertension We discussed sodium restriction, working on healthy weight loss, and a regular exercise program as the means to achieve improved blood pressure control. [name] agreed with this plan and agreed to follow up as directed. We will continue to monitor his blood pressure as well as his  progress with the above lifestyle modifications. Orlin will continue his medications as prescribed and will watch for signs of hypotension as he continues his lifestyle modifications. We will check CMP today. Wisdom agrees to follow up with our clinic in 2 weeks.  Depression Screen Marcio had a moderately positive depression screening. Depression is commonly associated with obesity and often results in emotional eating behaviors. We will monitor this closely and work on CBT to help improve the non-hunger eating patterns. Referral to Psychology  may be required if no improvement is seen as he continues in our clinic.  Obesity Giannis is currently in the action stage of change and his goal is to continue with weight loss efforts. I recommend Wilfred begin the structured treatment plan as follows:  He has agreed to follow the Category 2 plan + 100 calories Teigen has been instructed to eventually work up to a goal of 150 minutes of combined cardio and strengthening exercise per week for weight loss and overall health benefits. We discussed the following Behavioral Modification Strategies today: increasing lean protein intake, increasing vegetables, increase H20 intake, work on meal planning and easy cooking plans, and planning for success   He was informed of the importance of frequent follow up visits to maximize his success with intensive lifestyle modifications for his multiple health conditions. He was informed we would discuss his lab results at his next visit unless there is a critical issue that needs to be addressed sooner. Zeb agreed to keep his next visit at the agreed upon time to discuss these results.  ALLERGIES: Allergies  Allergen Reactions  . Metformin And Related Diarrhea  . Sulfa Antibiotics Other (See Comments)    "make my feet burn"    MEDICATIONS: Current Outpatient Medications on File Prior to Visit  Medication Sig Dispense Refill  . acetaminophen (TYLENOL) 325 MG tablet Take 650 mg by mouth every 6 (six) hours as needed for moderate pain.    [name] allopurinol (ZYLOPRIM) 300 MG tablet Take 1 tablet (300 mg total) by mouth daily. 90 tablet 0  . amLODipine (NORVASC) 5 MG tablet Take 1 tablet (5 mg total) by mouth daily. 90 tablet 1  . atorvastatin (LIPITOR) 20 MG tablet TAKE ONE TABLET BY MOUTH EVERY EVENING (Patient taking differently: Take 20 mg by mouth. ) 90 tablet 1  . [name] Take 5,000 mg by mouth 2 (two) times daily.     . clonazePAM (KLONOPIN) 0.5 MG tablet TAKE ONE TABLET BY MOUTH TWICE A DAY 180  tablet 0  . colchicine 0.6 MG tablet Take 1 tablet (0.6 mg total) by mouth daily. Take BID if experiencing a flare. 60 tablet 1  . Continuous Blood Gluc Receiver ([name] 14 DAY READER) DEVI USE AS DIRECTED THREE TIMES DAILY 3 Device 3  . Continuous Blood Gluc Sensor ([name] 14 DAY SENSOR) MISC USE AS DIRECTED FOR 14 DAYS 1 each 3  . furosemide (LASIX) 80 MG tablet Take 1 tablet (80 mg total) by mouth 2 (two) times daily. 180 tablet 3  . glucose blood ([name] TEST STRIPS) test strip Check blood sugars 2-3 times per day. DX: E11.9 300 each 2  . glucose monitoring kit ([name]) monitoring kit 1 each by Does not apply route as needed for other. DX Code: E11.9 1 each 0  . insulin lispro (HUMALOG) 100 UNIT/ML injection Inject 40 Units into the skin 3 (three) times daily before meals.    . metoprolol tartrate (LOPRESSOR) 100 MG tablet TAKE ONE TABLET  BY MOUTH TWICE A DAY 180 tablet 0  . NON FORMULARY Rehmannia 6 1335m TID     No current facility-administered medications on file prior to visit.     PAST MEDICAL HISTORY: Past Medical History:  Diagnosis Date  . Anxiety   . Bilateral renal cysts   . Foley catheter in place   . Gout    02-22-2017 acute right foot gout---  per pt resolved  . Gout   . Gross hematuria   . Heart murmur   . Hydronephrosis, left   . Hyperlipidemia   . Hypertension   . Renal insufficiency   . [place]   . Swelling   . [place] 2 diabetes [place]   . Urinary retention   . Wears glasses     PAST SURGICAL HISTORY: Past Surgical History:  Procedure Laterality Date  . APPENDECTOMY  age 89 . CYSTOSCOPY WITH URETEROSCOPY AND STENT PLACEMENT Left 05/08/2017   Procedure: URETEROSCOPY AND STENT PLACEMENT;  Surgeon: OKathie Rhodes MD;  [facility]  Service: Urology;  Laterality: Left;  . CYSTOSCOPY/RETROGRADE/URETEROSCOPY Bilateral 05/08/2017   Procedure: CYSTOSCOPY/ BILATERAL RETROGRADE;  Surgeon:  OKathie Rhodes MD;  [facility]  Service: Urology;  Laterality: Bilateral;  . INGUINAL HERNIA REPAIR Right 10/10/2000  . KNEE ARTHROSCOPY Right 1980's  . NASAL FRACTURE SURGERY     in highschool   . SHOULDER ARTHROSCOPY WITH OPEN ROTATOR CUFF REPAIR Right 1990's  . TRANSURETHRAL RESECTION OF PROSTATE      SOCIAL HISTORY: Social History   Tobacco Use  . Smoking status: Never Smoker  . Smokeless tobacco: Never Used  Substance Use Topics  . Alcohol use: No  . Drug use: No    FAMILY HISTORY: Family History  Problem Relation Age of Onset  . Congestive Heart Failure Mother        Related to valve disease.  Opted to treat medically  . Hypertension Mother   . Hyperlipidemia Mother   . Diabetes Mother   . Hypertension Father   . Hyperlipidemia Father   . Coronary artery disease Father 574      History of CABG  . Stroke Father   . Hypertension Brother   . Diabetes Brother   . Hypertension Daughter   . Hypertension Son   . Hypertension Brother   . Hypertension Brother     ROS: Review of Systems  Constitutional: Positive for malaise/fatigue. Negative for weight loss.       + Trouble sleeping  HENT:       + Nasal stuffiness  Eyes:       + Wear glasses or contacts  Respiratory: Positive for shortness of breath (with exertion).   Cardiovascular: Negative for chest pain and orthopnea.       + Leg cramping + Very cold feet or hands  Gastrointestinal: Negative for nausea and vomiting.  Musculoskeletal: Positive for back pain and neck pain.       Negative muscle weakness + Neck stiffness + Muscle or joint pain + Muscle stiffness  Skin:       + Dryness  Neurological: Positive for weakness.  Endo/Heme/Allergies:       Negative hypoglycemia + Heat/cold intolerance + Excessive thirst    PHYSICAL EXAM: Blood pressure (!) 144/67, pulse 82, temperature (!) 97.3 F (36.3 C), temperature source Oral, height _0  (1.651 m), weight 182 lb (82.6 kg),  SpO2 94 %. Body mass index is 30.29 kg/m. Physical Exam Vitals  signs reviewed.  Constitutional:      Appearance: Normal appearance. He is obese.  HENT:     Head: Normocephalic and atraumatic.     Nose: Nose normal.  Eyes:     General: No scleral icterus.    Extraocular Movements: Extraocular movements intact.  Neck:     Musculoskeletal: Normal range of motion and neck supple.     Comments: No thyromegaly present Cardiovascular:     Rate and Rhythm: Normal rate and regular rhythm.     Pulses: Normal pulses.     Heart sounds: Normal heart sounds.  Pulmonary:     Effort: Pulmonary effort is normal. No respiratory distress.     Breath sounds: Normal breath sounds.  Abdominal:     Palpations: Abdomen is soft.     Tenderness: There is no abdominal tenderness.     Comments: + Obesity  Musculoskeletal: Normal range of motion.     Right lower leg: No edema.     Left lower leg: No edema.  Skin:    General: Skin is warm and dry.  Neurological:     Mental Status: He is alert and oriented to person, place, and time.     Coordination: Coordination normal.  Psychiatric:        Mood and Affect: Mood normal.        Behavior: Behavior normal.     RECENT LABS AND TESTS: BMET    Component Value Date/Time   NA 144 01/28/2019 1413   K 4.1 01/28/2019 1413   CL 104 01/28/2019 1413   CO2 19 (L) 01/28/2019 1413   GLUCOSE 137 (H) 01/28/2019 1413   GLUCOSE 107 (H) 12/17/2018 1004   BUN 41 (H) 01/28/2019 1413   CREATININE 1.29 (H) 01/28/2019 1413   CREATININE 1.45 (H) 12/17/2018 1004   CALCIUM 9.6 01/28/2019 1413   GFRNONAA 57 (L) 01/28/2019 1413   GFRNONAA 50 (L) 12/17/2018 1004   GFRAA 66 01/28/2019 1413   GFRAA 58 (L) 12/17/2018 1004   Lab Results  Component Value Date   HGBA1C 6.7 (H) 08/14/2017   No results found for: INSULIN CBC    Component Value Date/Time   WBC 6.0 01/28/2019 1413   WBC 5.8 12/17/2018 1004   RBC 4.85 01/28/2019 1413   RBC 4.81 12/17/2018 1004   HGB  15.8 01/28/2019 1413   HCT 43.0 01/28/2019 1413   PLT 256 12/17/2018 1004   MCV 89 01/28/2019 1413   MCH 32.6 01/28/2019 1413   MCH 30.1 12/17/2018 1004   MCHC 36.7 (H) 01/28/2019 1413   MCHC 34.5 12/17/2018 1004   RDW 16.8 (H) 01/28/2019 1413   LYMPHSABS 1.9 01/28/2019 1413   MONOABS 0.8 11/03/2018 1407   EOSABS 0.1 01/28/2019 1413   BASOSABS 0.0 01/28/2019 1413   Iron/TIBC/Ferritin/ %Sat No results found for: IRON, TIBC, FERRITIN, IRONPCTSAT Lipid Panel     Component Value Date/Time   CHOL 278 (H) 01/28/2019 1413   TRIG 2,296 (HH) 01/28/2019 1413   HDL 14 (L) 01/28/2019 1413   CHOLHDL 4 11/21/2018 1102   VLDL 71.8 (H) 11/21/2018 1102   LDLCALC Comment 01/28/2019 1413   LDLDIRECT 63.0 11/21/2018 1102   Hepatic Function Panel     Component Value Date/Time   PROT 6.9 01/28/2019 1413   ALBUMIN 4.4 01/28/2019 1413   AST 49 (H) 01/28/2019 1413   ALT 43 01/28/2019 1413   ALKPHOS 80 01/28/2019 1413   BILITOT 0.3 01/28/2019 1413   BILIDIR 0.1 09/04/2008 0953  Component Value Date/Time   TSH 3.370 01/28/2019 1413   TSH 1.78 01/15/2007 0829    ECG  shows NSR with a rate of 84 BPM INDIRECT CALORIMETER done today shows a VO2 of 276 and a REE of 1919.  His calculated basal metabolic rate is 1165 thus his basal metabolic rate is better than expected.       OBESITY BEHAVIORAL INTERVENTION VISIT  Today's visit was # 1   Starting weight: 182 lbs Starting date: 01/28/2019 Today's weight : 182 lbs  Today's date: 01/28/2019 Total lbs lost to date: 0    ASK: We discussed the diagnosis of obesity with Karna Dupes today and [name] agreed to give [name] permission to discuss obesity behavioral modification therapy today.  ASSESS: Lacy has the diagnosis of obesity and his BMI today is 30.29 Jerimiah is in the action stage of change   ADVISE: Mikel was educated on the multiple health risks of obesity as well as the benefit of weight loss to improve his health. He was  advised of the need for long term treatment and the importance of lifestyle modifications to improve his current health and to decrease his risk of future health problems.  AGREE: Multiple dietary modification options and treatment options were discussed and  Shed agreed to follow the recommendations documented in the above note.  ARRANGE: Kayston was educated on the importance of frequent visits to treat obesity as outlined per CMS and USPSTF guidelines and agreed to schedule his next follow up appointment today.  I, Trixie Dredge, am acting as transcriptionist for Ilene Qua, MD  I have reviewed the above documentation for accuracy and completeness, and I agree with the above. - Ilene Qua, MD

## 2019-02-04 DIAGNOSIS — E785 Hyperlipidemia, unspecified: Secondary | ICD-10-CM | POA: Diagnosis not present

## 2019-02-04 DIAGNOSIS — Z5181 Encounter for therapeutic drug level monitoring: Secondary | ICD-10-CM | POA: Diagnosis not present

## 2019-02-04 DIAGNOSIS — E119 Type 2 diabetes mellitus without complications: Secondary | ICD-10-CM | POA: Diagnosis not present

## 2019-02-04 DIAGNOSIS — Z794 Long term (current) use of insulin: Secondary | ICD-10-CM | POA: Diagnosis not present

## 2019-02-11 ENCOUNTER — Ambulatory Visit (INDEPENDENT_AMBULATORY_CARE_PROVIDER_SITE_OTHER): Payer: PPO | Admitting: Family Medicine

## 2019-02-11 ENCOUNTER — Encounter (INDEPENDENT_AMBULATORY_CARE_PROVIDER_SITE_OTHER): Payer: Self-pay | Admitting: Family Medicine

## 2019-02-11 VITALS — BP 132/79 | HR 63 | Temp 98.0°F | Ht 65.0 in | Wt 182.0 lb

## 2019-02-11 DIAGNOSIS — E669 Obesity, unspecified: Secondary | ICD-10-CM

## 2019-02-11 DIAGNOSIS — E1165 Type 2 diabetes mellitus with hyperglycemia: Secondary | ICD-10-CM | POA: Diagnosis not present

## 2019-02-11 DIAGNOSIS — Z794 Long term (current) use of insulin: Secondary | ICD-10-CM | POA: Diagnosis not present

## 2019-02-11 DIAGNOSIS — E781 Pure hyperglyceridemia: Secondary | ICD-10-CM

## 2019-02-11 DIAGNOSIS — I10 Essential (primary) hypertension: Secondary | ICD-10-CM

## 2019-02-11 DIAGNOSIS — Z6830 Body mass index (BMI) 30.0-30.9, adult: Secondary | ICD-10-CM | POA: Diagnosis not present

## 2019-02-11 NOTE — Progress Notes (Signed)
Office: [phone]  /  Fax: [phone]   HPI:   Chief Complaint: OBESITY Jerry York is here to discuss his progress with his obesity treatment plan. He is on the Category 2 plan + 100 calories and is following his eating plan approximately 98 % of the time. He states he is exercising 0 minutes 0 times per week. Jerry York denies being hungry. He found that he was eating significantly more than he was used to. He is eating most snacks, but skipping bread fairly religious.  His weight is 182 lb (82.6 kg) today and has not lost weight since his last visit. He has lost 0 lbs since starting treatment with [name].  Diabetes II with Hyperglycemia Jerry York has a diagnosis of diabetes type II. Jerry York states BGs range between 194 and 335. Last Hgb A1c was 6.0 and C-peptide was 8.4. He denies hypoglycemic episodes. He has been working on intensive lifestyle modifications including diet, exercise, and weight loss to help control his blood glucose levels.  Hypertriglyceridemia Jerry York restarted triglyceride medication (Nycor). He stopped taking it 10 years ago.  Hypertension Jerry York is a 68 y.o. male with hypertension. Jerry York's blood pressure is controlled. He denies chest pain, chest pressure, or headaches. He is working on weight loss to help control his blood pressure with the goal of decreasing his risk of heart attack and stroke.   ASSESSMENT AND PLAN:  Type 2 diabetes mellitus with hyperglycemia, with long-term current use of insulin (HCC)  Hypertriglyceridemia  Essential hypertension  Class 1 obesity with serious comorbidity and body mass index (BMI) of 30.0 to 30.9 in adult, unspecified obesity type  PLAN:  Diabetes II with Hyperglycemia Jerry York has been given extensive diabetes education by myself today including ideal fasting and post-prandial blood glucose readings, individual ideal Hgb A1c goals and hypoglycemia prevention. We discussed the importance of good blood sugar control to  decrease the likelihood of diabetic complications such as nephropathy, neuropathy, limb loss, blindness, coronary artery disease, and death. We discussed the importance of intensive lifestyle modification including diet, exercise and weight loss as the first line treatment for diabetes. Jerry York agrees to continue his current diabetes medications. He sees Endocrinology, last appointment was last week. Jerry York agrees to follow up with our clinic in 2 weeks.   Hypertriglyceridemia Jerry York is to follow up with his primary care physician and Cardiology. Jerry York agrees to follow up with our clinic in 2 weeks.  Hypertension We discussed sodium restriction, working on healthy weight loss, and a regular exercise program as the means to achieve improved blood pressure control. Jerry York agreed with this plan and agreed to follow up as directed. We will continue to monitor his blood pressure as well as his progress with the above lifestyle modifications. Jerry York agrees to continue his current medicarions and will watch for signs of hypotension as he continues his lifestyle modifications. Jerry York agrees to follow up with our clinic in 2 weeks.  I spent > than 50% of the 25 minute visit on counseling as documented in the note.  Obesity Jerry York is currently in the action stage of change. As such, his goal is to continue with weight loss efforts He has agreed to follow the Category 2 plan + 100 calories Jerry York has been instructed to work up to a goal of 150 minutes of combined cardio and strengthening exercise per week for weight loss and overall health benefits. We discussed the following Behavioral Modification Strategies today: increasing lean protein intake, increasing vegetables and work on meal planning  and easy cooking plans, and planning for success   Jerry York has agreed to follow up with our clinic in 2 weeks. He was informed of the importance of frequent follow up visits to maximize his success with intensive  lifestyle modifications for his multiple health conditions.  ALLERGIES: Allergies  Allergen Reactions  . Metformin And Related Diarrhea  . Sulfa Antibiotics Other (See Comments)    "make my feet burn"    MEDICATIONS: Current Outpatient Medications on File Prior to Visit  Medication Sig Dispense Refill  . acetaminophen (TYLENOL) 325 MG tablet Take 650 mg by mouth every 6 (six) hours as needed for moderate pain.    [name] allopurinol (ZYLOPRIM) 300 MG tablet Take 1 tablet (300 mg total) by mouth daily. 90 tablet 0  . amLODipine (NORVASC) 5 MG tablet Take 1 tablet (5 mg total) by mouth daily. 90 tablet 1  . atorvastatin (LIPITOR) 20 MG tablet TAKE ONE TABLET BY MOUTH EVERY EVENING (Patient taking differently: Take 20 mg by mouth. ) 90 tablet 1  . [name] Take 5,000 mg by mouth 2 (two) times daily.     . clonazePAM (KLONOPIN) 0.5 MG tablet TAKE ONE TABLET BY MOUTH TWICE A DAY 180 tablet 0  . colchicine 0.6 MG tablet Take 1 tablet (0.6 mg total) by mouth daily. Take BID if experiencing a flare. 60 tablet 1  . Continuous Blood Gluc Receiver ([name] 14 DAY READER) DEVI USE AS DIRECTED THREE TIMES DAILY 3 Device 3  . Continuous Blood Gluc Sensor ([name] 14 DAY SENSOR) MISC USE AS DIRECTED FOR 14 DAYS 1 each 3  . furosemide (LASIX) 80 MG tablet Take 1 tablet (80 mg total) by mouth 2 (two) times daily. 180 tablet 3  . glucose blood ([name] TEST STRIPS) test strip Check blood sugars 2-3 times per day. DX: E11.9 300 each 2  . glucose monitoring kit ([name]) monitoring kit 1 each by Does not apply route as needed for other. DX Code: E11.9 1 each 0  . insulin lispro (HUMALOG) 100 UNIT/ML injection Inject 40 Units into the skin 3 (three) times daily before meals.    . metoprolol tartrate (LOPRESSOR) 100 MG tablet TAKE ONE TABLET BY MOUTH TWICE A DAY 180 tablet 0  . NON FORMULARY Rehmannia 6 1341m TID     No current facility-administered medications on file prior to visit.      PAST MEDICAL HISTORY: Past Medical History:  Diagnosis Date  . Anxiety   . Bilateral renal cysts   . Foley catheter in place   . Gout    02-22-2017 acute right foot gout---  per pt resolved  . Gout   . Gross hematuria   . Heart murmur   . Hydronephrosis, left   . Hyperlipidemia   . Hypertension   . Renal insufficiency   . [place]   . Swelling   . [place]   . Urinary retention   . Wears glasses     PAST SURGICAL HISTORY: Past Surgical History:  Procedure Laterality Date  . APPENDECTOMY  age 68 . CYSTOSCOPY WITH URETEROSCOPY AND STENT PLACEMENT Left 05/08/2017   Procedure: URETEROSCOPY AND STENT PLACEMENT;  Surgeon: OKathie Rhodes MD;  [facility]  Service: Urology;  Laterality: Left;  . CYSTOSCOPY/RETROGRADE/URETEROSCOPY Bilateral 05/08/2017   Procedure: CYSTOSCOPY/ BILATERAL RETROGRADE;  Surgeon: OKathie Rhodes MD;  [facility]  Service: Urology;  Laterality: Bilateral;  . INGUINAL HERNIA REPAIR Right  10/10/2000  . KNEE ARTHROSCOPY Right 1980's  . NASAL FRACTURE SURGERY     in highschool   . SHOULDER ARTHROSCOPY WITH OPEN ROTATOR CUFF REPAIR Right 1990's  . TRANSURETHRAL RESECTION OF PROSTATE      SOCIAL HISTORY: Social History   Tobacco Use  . Smoking status: Never Smoker  . Smokeless tobacco: Never Used  Substance Use Topics  . Alcohol use: No  . Drug use: No    FAMILY HISTORY: Family History  Problem Relation Age of Onset  . Congestive Heart Failure Mother        Related to valve disease.  Opted to treat medically  . Hypertension Mother   . Hyperlipidemia Mother   . Diabetes Mother   . Hypertension Father   . Hyperlipidemia Father   . Coronary artery disease Father 56       History of CABG  . Stroke Father   . Hypertension Brother   . Diabetes Brother   . Hypertension Daughter   . Hypertension Son   . Hypertension Brother   . Hypertension Brother      ROS: Review of Systems  Constitutional: Negative for weight loss.  Cardiovascular: Negative for chest pain.       Negative chest pressure  Neurological: Negative for headaches.  Endo/Heme/Allergies:       Negative hypoglycemia    PHYSICAL EXAM: Blood pressure 132/79, pulse 63, temperature 98 F (36.7 C), temperature source Oral, height 5' 5"  (1.651 m), weight 182 lb (82.6 kg), SpO2 100 %. Body mass index is 30.29 kg/m. Physical Exam Vitals signs reviewed.  Constitutional:      Appearance: Normal appearance. He is obese.  Cardiovascular:     Rate and Rhythm: Normal rate.     Pulses: Normal pulses.  Pulmonary:     Effort: Pulmonary effort is normal.     Breath sounds: Normal breath sounds.  Musculoskeletal: Normal range of motion.  Skin:    General: Skin is warm and dry.  Neurological:     Mental Status: He is alert and oriented to person, place, and time.  Psychiatric:        Mood and Affect: Mood normal.        Behavior: Behavior normal.     RECENT LABS AND TESTS: BMET    Component Value Date/Time   NA 144 01/28/2019 1413   K 4.1 01/28/2019 1413   CL 104 01/28/2019 1413   CO2 19 (L) 01/28/2019 1413   GLUCOSE 137 (H) 01/28/2019 1413   GLUCOSE 107 (H) 12/17/2018 1004   BUN 41 (H) 01/28/2019 1413   CREATININE 1.29 (H) 01/28/2019 1413   CREATININE 1.45 (H) 12/17/2018 1004   CALCIUM 9.6 01/28/2019 1413   GFRNONAA 57 (L) 01/28/2019 1413   GFRNONAA 50 (L) 12/17/2018 1004   GFRAA 66 01/28/2019 1413   GFRAA 58 (L) 12/17/2018 1004   Lab Results  Component Value Date   HGBA1C 6.7 (H) 08/14/2017   HGBA1C 8.3 (H) 01/15/2007   No results found for: INSULIN CBC    Component Value Date/Time   WBC 6.0 01/28/2019 1413   WBC 5.8 12/17/2018 1004   RBC 4.85 01/28/2019 1413   RBC 4.81 12/17/2018 1004   HGB 15.8 01/28/2019 1413   HCT 43.0 01/28/2019 1413   PLT 256 12/17/2018 1004   MCV 89 01/28/2019 1413   MCH 32.6 01/28/2019 1413   MCH 30.1 12/17/2018 1004    MCHC 36.7 (H) 01/28/2019 1413   MCHC 34.5 12/17/2018 1004  RDW 16.8 (H) 01/28/2019 1413   LYMPHSABS 1.9 01/28/2019 1413   MONOABS 0.8 11/03/2018 1407   EOSABS 0.1 01/28/2019 1413   BASOSABS 0.0 01/28/2019 1413   Iron/TIBC/Ferritin/ %Sat No results found for: IRON, TIBC, FERRITIN, IRONPCTSAT Lipid Panel     Component Value Date/Time   CHOL 278 (H) 01/28/2019 1413   TRIG 2,296 (HH) 01/28/2019 1413   HDL 14 (L) 01/28/2019 1413   CHOLHDL 4 11/21/2018 1102   VLDL 71.8 (H) 11/21/2018 1102   LDLCALC Comment 01/28/2019 1413   LDLDIRECT 63.0 11/21/2018 1102   Hepatic Function Panel     Component Value Date/Time   PROT 6.9 01/28/2019 1413   ALBUMIN 4.4 01/28/2019 1413   AST 49 (H) 01/28/2019 1413   ALT 43 01/28/2019 1413   ALKPHOS 80 01/28/2019 1413   BILITOT 0.3 01/28/2019 1413   BILIDIR 0.1 09/04/2008 0953      Component Value Date/Time   TSH 3.370 01/28/2019 1413   TSH 1.78 01/15/2007 0829      OBESITY BEHAVIORAL INTERVENTION VISIT  Today's visit was # 2   Starting weight: 182 lbs Starting date: 01/28/2019 Today's weight : 182 lbs Today's date: 02/11/2019 Total lbs lost to date: 0    ASK: We discussed the diagnosis of obesity with Karna Dupes today and Jerry York agreed to give [name] permission to discuss obesity behavioral modification therapy today.  ASSESS: Abdallah has the diagnosis of obesity and his BMI today is 30.29 Noriel is in the action stage of change   ADVISE: Jyron was educated on the multiple health risks of obesity as well as the benefit of weight loss to improve his health. He was advised of the need for long term treatment and the importance of lifestyle modifications to improve his current health and to decrease his risk of future health problems.  AGREE: Multiple dietary modification options and treatment options were discussed and  Lazer agreed to follow the recommendations documented in the above note.  ARRANGE: Atticus was educated on the  importance of frequent visits to treat obesity as outlined per CMS and USPSTF guidelines and agreed to schedule his next follow up appointment today.  I, Jerry York, am acting as transcriptionist for Jerry Qua, MD  I have reviewed the above documentation for accuracy and completeness, and I agree with the above. - Jerry Qua, MD

## 2019-02-17 ENCOUNTER — Other Ambulatory Visit: Payer: Self-pay | Admitting: Family Medicine

## 2019-02-17 ENCOUNTER — Other Ambulatory Visit: Payer: Self-pay | Admitting: Rheumatology

## 2019-02-18 ENCOUNTER — Other Ambulatory Visit: Payer: Self-pay | Admitting: Pharmacy Technician

## 2019-02-18 NOTE — Telephone Encounter (Signed)
Last visit: 12/17/18 Next visit: 03/18/19 Labs: 01/28/19 Creat 1.29 GFR 57 BUN 41 Glucose 137 AST 49  Okay to refill per Dr. Corliss Skains

## 2019-02-18 NOTE — Patient Outreach (Signed)
[facility]) Care Management  02/18/2019  Jerry York 1950-12-29 [id number]    Successful call placed to patient regarding patient assistance medication receipt from [facility], HIPAA identifiers verified. Jerry York confirms that he received 10 vials of Humalog from [facility] however his dose has increased since receiving them. He states he is now injecting 50 units TID. Informed him that we [facility]) will inform his provider that a new script will need to be sent into company but also encouraged him to contact Dr. Daune Perch office when he starts his last vial to confirm they have initiated the new script process.   Will route note to [facility] RPh Colleen Summe to inform and for case closure.  Suzan Slick [name] CPhT Certified Pharmacy Technician [facility] Care Management Direct Dial:[phone]

## 2019-02-19 DIAGNOSIS — H5202 Hypermetropia, left eye: Secondary | ICD-10-CM | POA: Diagnosis not present

## 2019-02-20 ENCOUNTER — Other Ambulatory Visit: Payer: Self-pay | Admitting: Pharmacist

## 2019-02-20 NOTE — Patient Outreach (Signed)
[facility]) Care Management  [facility] 02/20/2019  Jerry York April 17, 1951 [id number]  Care coordination call placed to Dr. Daune Perch office regarding updated SIG for patient's insulin through [facility].  -spoke with RN, Sherlon Handing who will contact [employer] to provide new dosing / # vials / day supply for insulin through [employer].  Phone number provided for [employer].  -Patient previously updated by [facility] pharmacy technician and voiced understanding as well  Plan: Will close [facility] pharmacy case as no further medication concerns at this time.  Am happy to assist in the future as needed.   Haynes Hoehn, PharmD, [facility] Clinical Pharmacist [facility] [employer] [phone]

## 2019-02-21 ENCOUNTER — Other Ambulatory Visit: Payer: Self-pay | Admitting: Family Medicine

## 2019-02-24 ENCOUNTER — Other Ambulatory Visit: Payer: Self-pay | Admitting: Family Medicine

## 2019-02-25 ENCOUNTER — Other Ambulatory Visit: Payer: Self-pay | Admitting: Family Medicine

## 2019-02-26 ENCOUNTER — Ambulatory Visit (INDEPENDENT_AMBULATORY_CARE_PROVIDER_SITE_OTHER): Payer: PPO | Admitting: Family Medicine

## 2019-02-26 ENCOUNTER — Encounter (INDEPENDENT_AMBULATORY_CARE_PROVIDER_SITE_OTHER): Payer: Self-pay | Admitting: Family Medicine

## 2019-02-26 VITALS — BP 144/78 | HR 77 | Temp 97.8°F | Ht 65.0 in | Wt 185.0 lb

## 2019-02-26 DIAGNOSIS — E1165 Type 2 diabetes mellitus with hyperglycemia: Secondary | ICD-10-CM

## 2019-02-26 DIAGNOSIS — I10 Essential (primary) hypertension: Secondary | ICD-10-CM | POA: Diagnosis not present

## 2019-02-26 DIAGNOSIS — E669 Obesity, unspecified: Secondary | ICD-10-CM

## 2019-02-26 DIAGNOSIS — Z6830 Body mass index (BMI) 30.0-30.9, adult: Secondary | ICD-10-CM | POA: Diagnosis not present

## 2019-02-26 DIAGNOSIS — Z794 Long term (current) use of insulin: Secondary | ICD-10-CM | POA: Diagnosis not present

## 2019-02-26 NOTE — Progress Notes (Signed)
Office: [phone]  /  Fax: [phone]   HPI:   Chief Complaint: OBESITY Jerry York is here to discuss his progress with his obesity treatment plan. He is on the Category 2 plan + 100 calories and is following his eating plan approximately 98% of the time. He states he is exercising 0 minutes 0 times per week. Jerry York is starting to get bored with the quantity of meat, especially at dinner. He is looking for ways to eat less meat but get all of his protein in. His weight is 185 lb (83.9 kg) today and has had a weight gain of 3 lbs since his last visit. He has lost 0 lbs since starting treatment with [name].  Diabetes II with Hyperglycemia on Long-Term Insulin Jerry York has a diagnosis of diabetes type II. Jerry York states fasting blood sugars range between 182 and 315; postprandials range between 140 and 296. Last A1c was reported at 6.7 on 08/14/2017. He has been working on intensive lifestyle modifications including diet, exercise, and weight loss to help control his blood glucose levels. He denies any hypoglycemic events.  Essential Hypertension Jerry York is a 68 y.o. male with hypertension.  Burle Kwan Deshong denies chest pain, chest pressure, and headache. He is working weight loss to help control his blood pressure with the goal of decreasing his risk of heart attack and stroke. Jerry York' blood pressure is slightly elevated today (he hasn't taken his medicine today).   ASSESSMENT AND PLAN:  Type 2 diabetes mellitus with hyperglycemia, with long-term current use of insulin (HCC)  Essential hypertension  Class 1 obesity with serious comorbidity and body mass index (BMI) of 30.0 to 30.9 in adult, unspecified obesity type  PLAN:  Diabetes II with Hyperglycemia on Long-Term Insulin Elick has been given extensive diabetes education by myself today including ideal fasting and post-prandial blood glucose readings, individual ideal HgA1c goals  and hypoglycemia prevention. We discussed the  importance of good blood sugar control to decrease the likelihood of diabetic complications such as nephropathy, neuropathy, limb loss, blindness, coronary artery disease, and death. We discussed the importance of intensive lifestyle modification including diet, exercise and weight loss as the first line treatment for diabetes. [name] agrees to continue his diabetes medications, will follow-up at the agreed upon time, and will have follow-up blood work in 2 months.  Essential Hypertension We discussed sodium restriction, working on healthy weight loss, and a regular exercise program as the means to achieve improved blood pressure control. Jerry York agreed with this plan and agreed to follow up as directed. We will continue to monitor his blood pressure as well as his progress with the above lifestyle modifications. He will continue his medications as prescribed and will watch for signs of hypotension as he continues his lifestyle modifications. He will have follow-up blood pressure at his next appointment; this was previously well controlled.  I spent > than 50% of the 15 minute visit on counseling as documented in the note.  Obesity Jerry York is currently in the action stage of change. As such, his goal is to continue with weight loss efforts. He has agreed to follow the Category 2 plan + 100 calories and journaling 400-500 calories + 35+ grams of protein at supper. Jerry York has been instructed to work up to a goal of 150 minutes of combined cardio and strengthening exercise per week for weight loss and overall health benefits. We discussed the following Behavioral Modification Strategies today: increasing lean protein intake, increasing vegetables, work on meal planning and  easy cooking plans, better snacking choices, and planning for success.  Jerry York has agreed to follow-up with our clinic in 2 weeks. He was informed of the importance of frequent follow up visits to maximize his success with intensive  lifestyle modifications for his multiple health conditions.  ALLERGIES: Allergies  Allergen Reactions  . Metformin And Related Diarrhea  . Sulfa Antibiotics Other (See Comments)    "make my feet burn"    MEDICATIONS: Current Outpatient Medications on File Prior to Visit  Medication Sig Dispense Refill  . acetaminophen (TYLENOL) 325 MG tablet Take 650 mg by mouth every 6 (six) hours as needed for moderate pain.    [name] allopurinol (ZYLOPRIM) 300 MG tablet TAKE ONE TABLET BY MOUTH DAILY 90 tablet 0  . amLODipine (NORVASC) 5 MG tablet Take 1 tablet (5 mg total) by mouth daily. 90 tablet 1  . atorvastatin (LIPITOR) 20 MG tablet TAKE ONE TABLET BY MOUTH EVERY EVENING (Patient taking differently: Take 20 mg by mouth. ) 90 tablet 1  . [name] Take 5,000 mg by mouth 2 (two) times daily.     . clonazePAM (KLONOPIN) 0.5 MG tablet TAKE ONE TABLET BY MOUTH TWICE A DAY 180 tablet 0  . colchicine 0.6 MG tablet Take 1 tablet (0.6 mg total) by mouth daily. Take BID if experiencing a flare. 60 tablet 1  . Continuous Blood Gluc Receiver ([name] 14 DAY READER) DEVI USE AS DIRECTED THREE TIMES DAILY 3 Device 3  . Continuous Blood Gluc Sensor ([name] 14 DAY SENSOR) MISC USE AS DIRECTED FOR 14 DAYS 1 each 0  . furosemide (LASIX) 80 MG tablet Take 1 tablet (80 mg total) by mouth 2 (two) times daily. 180 tablet 3  . glucose blood ([name] TEST STRIPS) test strip Check blood sugars 2-3 times per day. DX: E11.9 300 each 2  . glucose monitoring kit ([name]) monitoring kit 1 each by Does not apply route as needed for other. DX Code: E11.9 1 each 0  . insulin lispro (HUMALOG) 100 UNIT/ML injection Inject 40 Units into the skin 3 (three) times daily before meals.    . metoprolol tartrate (LOPRESSOR) 100 MG tablet TAKE ONE TABLET BY MOUTH TWICE A DAY 180 tablet 0  . NON FORMULARY Rehmannia 6 1355m TID     No current facility-administered medications on file prior to visit.     PAST MEDICAL  HISTORY: Past Medical History:  Diagnosis Date  . Anxiety   . Bilateral renal cysts   . Foley catheter in place   . Gout    02-22-2017 acute right foot gout---  per pt resolved  . Gout   . Gross hematuria   . Heart murmur   . Hydronephrosis, left   . Hyperlipidemia   . Hypertension   . Renal insufficiency   . Rheumatoid arthritis ([place]   . Swelling   . Type 2 diabetes mellitus ([place]   . Urinary retention   . Wears glasses     PAST SURGICAL HISTORY: Past Surgical History:  Procedure Laterality Date  . APPENDECTOMY  age 68 . CYSTOSCOPY WITH URETEROSCOPY AND STENT PLACEMENT Left 05/08/2017   Procedure: URETEROSCOPY AND STENT PLACEMENT;  Surgeon: OKathie Rhodes MD;  [facility]  Service: Urology;  Laterality: Left;  . CYSTOSCOPY/RETROGRADE/URETEROSCOPY Bilateral 05/08/2017   Procedure: CYSTOSCOPY/ BILATERAL RETROGRADE;  Surgeon: OKathie Rhodes MD;  [facility]  Service: Urology;  Laterality: Bilateral;  . INGUINAL HERNIA REPAIR Right 10/10/2000  .  KNEE ARTHROSCOPY Right 1980's  . NASAL FRACTURE SURGERY     in highschool   . SHOULDER ARTHROSCOPY WITH OPEN ROTATOR CUFF REPAIR Right 1990's  . TRANSURETHRAL RESECTION OF PROSTATE      SOCIAL HISTORY: Social History   Tobacco Use  . Smoking status: Never Smoker  . Smokeless tobacco: Never Used  Substance Use Topics  . Alcohol use: No  . Drug use: No    FAMILY HISTORY: Family History  Problem Relation Age of Onset  . Congestive Heart Failure Mother        Related to valve disease.  Opted to treat medically  . Hypertension Mother   . Hyperlipidemia Mother   . Diabetes Mother   . Hypertension Father   . Hyperlipidemia Father   . Coronary artery disease Father 8       History of CABG  . Stroke Father   . Hypertension Brother   . Diabetes Brother   . Hypertension Daughter   . Hypertension Son   . Hypertension Brother   . Hypertension Brother    ROS: Review of  Systems  Constitutional: Negative for weight loss.  Cardiovascular: Negative for chest pain.       Negative for chest pressure.  Neurological: Negative for headaches.   PHYSICAL EXAM: Blood pressure (!) 144/78, pulse 77, temperature 97.8 F (36.6 C), temperature source Oral, height _0  (1.651 m), weight 185 lb (83.9 kg), SpO2 98 %. Body mass index is 30.79 kg/m. Physical Exam Vitals signs reviewed.  Constitutional:      Appearance: Normal appearance. He is obese.  Cardiovascular:     Rate and Rhythm: Normal rate.     Pulses: Normal pulses.  Pulmonary:     Effort: Pulmonary effort is normal.     Breath sounds: Normal breath sounds.  Musculoskeletal: Normal range of motion.  Skin:    General: Skin is warm and dry.  Neurological:     Mental Status: He is alert and oriented to person, place, and time.  Psychiatric:        Behavior: Behavior normal.   RECENT LABS AND TESTS: BMET    Component Value Date/Time   NA 144 01/28/2019 1413   K 4.1 01/28/2019 1413   CL 104 01/28/2019 1413   CO2 19 (L) 01/28/2019 1413   GLUCOSE 137 (H) 01/28/2019 1413   GLUCOSE 107 (H) 12/17/2018 1004   BUN 41 (H) 01/28/2019 1413   CREATININE 1.29 (H) 01/28/2019 1413   CREATININE 1.45 (H) 12/17/2018 1004   CALCIUM 9.6 01/28/2019 1413   GFRNONAA 57 (L) 01/28/2019 1413   GFRNONAA 50 (L) 12/17/2018 1004   GFRAA 66 01/28/2019 1413   GFRAA 58 (L) 12/17/2018 1004   Lab Results  Component Value Date   HGBA1C 6.7 (H) 08/14/2017   HGBA1C 8.3 (H) 01/15/2007   No results found for: INSULIN CBC    Component Value Date/Time   WBC 6.0 01/28/2019 1413   WBC 5.8 12/17/2018 1004   RBC 4.85 01/28/2019 1413   RBC 4.81 12/17/2018 1004   HGB 15.8 01/28/2019 1413   HCT 43.0 01/28/2019 1413   PLT 256 12/17/2018 1004   MCV 89 01/28/2019 1413   MCH 32.6 01/28/2019 1413   MCH 30.1 12/17/2018 1004   MCHC 36.7 (H) 01/28/2019 1413   MCHC 34.5 12/17/2018 1004   RDW 16.8 (H) 01/28/2019 1413   LYMPHSABS 1.9  01/28/2019 1413   MONOABS 0.8 11/03/2018 1407   EOSABS 0.1 01/28/2019 1413   BASOSABS 0.0  01/28/2019 1413   Iron/TIBC/Ferritin/ %Sat No results found for: IRON, TIBC, FERRITIN, IRONPCTSAT Lipid Panel     Component Value Date/Time   CHOL 278 (H) 01/28/2019 1413   TRIG 2,296 (HH) 01/28/2019 1413   HDL 14 (L) 01/28/2019 1413   CHOLHDL 4 11/21/2018 1102   VLDL 71.8 (H) 11/21/2018 1102   LDLCALC Comment 01/28/2019 1413   LDLDIRECT 63.0 11/21/2018 1102   Hepatic Function Panel     Component Value Date/Time   PROT 6.9 01/28/2019 1413   ALBUMIN 4.4 01/28/2019 1413   AST 49 (H) 01/28/2019 1413   ALT 43 01/28/2019 1413   ALKPHOS 80 01/28/2019 1413   BILITOT 0.3 01/28/2019 1413   BILIDIR 0.1 09/04/2008 0953      Component Value Date/Time   TSH 3.370 01/28/2019 1413   TSH 1.78 01/15/2007 0829    Ref. Range 01/28/2019 14:13  Vitamin D, 25-Hydroxy Latest Units: ng/mL CANCELED   OBESITY BEHAVIORAL INTERVENTION VISIT  Today's visit was #3  Starting weight: 182 lbs Starting date: 01/28/2019 Today's weight: 185 lbs Today's date: 02/26/2019 Total lbs lost to date: 0    02/26/2019  Height _0  (1.651 m)  Weight 185 lb (83.9 kg)  BMI (Calculated) 30.79  BLOOD PRESSURE - SYSTOLIC 003  BLOOD PRESSURE - DIASTOLIC 78   Body Fat % 49.1 %  Total Body Water (lbs) 98 lbs   ASK: We discussed the diagnosis of obesity with Karna Dupes today and Jerry York agreed to give [name] permission to discuss obesity behavioral modification therapy today.  ASSESS: Camdyn has the diagnosis of obesity and his BMI today is 30.79. Jovann is in the action stage of change.   ADVISE: Jedd was educated on the multiple health risks of obesity as well as the benefit of weight loss to improve his health. He was advised of the need for long term treatment and the importance of lifestyle modifications to improve his current health and to decrease his risk of future health problems.  AGREE: Multiple dietary  modification options and treatment options were discussed and  Landis agreed to follow the recommendations documented in the above note.  ARRANGE: Advik was educated on the importance of frequent visits to treat obesity as outlined per CMS and USPSTF guidelines and agreed to schedule his next follow up appointment today.  I, Michaelene Song, am acting as transcriptionist for Ilene Qua, MD  I have reviewed the above documentation for accuracy and completeness, and I agree with the above. - Ilene Qua, MD

## 2019-03-04 ENCOUNTER — Ambulatory Visit: Payer: PPO | Admitting: Family Medicine

## 2019-03-04 NOTE — Progress Notes (Deleted)
Office Visit Note  Patient: Jerry York             Date of Birth: 09-14-1951           MRN: [id number]             PCP: Ardith Dark, MD Referring: Ardith Dark, MD Visit Date: 03/18/2019 Occupation: @GUAROCC @  Subjective:  No chief complaint on file.   History of Present Illness: Jerry York is a 68 y.o. male ***   Activities of Daily Living:  Patient reports morning stiffness for *** {minute/hour:19697}.   Patient {ACTIONS;DENIES/REPORTS:21021675::"Denies"} nocturnal pain.  Difficulty dressing/grooming: {ACTIONS;DENIES/REPORTS:21021675::"Denies"} Difficulty climbing stairs: {ACTIONS;DENIES/REPORTS:21021675::"Denies"} Difficulty getting out of chair: {ACTIONS;DENIES/REPORTS:21021675::"Denies"} Difficulty using hands for taps, buttons, cutlery, and/or writing: {ACTIONS;DENIES/REPORTS:21021675::"Denies"}  No Rheumatology ROS completed.   PMFS History:  Patient Active Problem List   Diagnosis Date Noted  . Primary osteoarthritis of right knee 12/17/2018  . Primary osteoarthritis of right hip 12/17/2018  . Hyperuricemia 12/03/2018  . Essential hypertension 12/03/2018  . History of diabetes mellitus 12/03/2018  . History of anxiety 11/26/2018  . Heart murmur 11/26/2018  . Foot pain, right   . Unsteady gait   . Right knee pain 01/10/2018  . Lower extremity edema 12/11/2017  . Hypertriglyceridemia 11/27/2017  . Family history of premature CAD 11/27/2017  . 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 History of BPH 08/14/2017  . Type 2 diabetes mellitus (HCC) 08/09/2017  . Gout 08/09/2017  . Hydronephrosis, left 08/09/2017  . Anxiety 08/09/2017  . Chest pain with low risk for cardiac etiology 08/09/2017  . Hyperlipidemia associated with type 2 diabetes mellitus (HCC) 08/08/2017  . Hypertension associated with diabetes (HCC) 08/08/2017    Past Medical History:  Diagnosis Date  . Anxiety   . Bilateral renal cysts   .  Foley catheter in place   . Gout    02-22-2017 acute right foot gout---  per pt resolved  . Gout   . Gross hematuria   . Heart murmur   . Hydronephrosis, left   . Hyperlipidemia   . Hypertension   . Renal insufficiency   . Rheumatoid arthritis (HCC)   . Swelling   . Type 2 diabetes mellitus (HCC)   . Urinary retention   . Wears glasses     Family History  Problem Relation Age of Onset  . Congestive Heart Failure Mother        Related to valve disease.  Opted to treat medically  . Hypertension Mother   . Hyperlipidemia Mother   . Diabetes Mother   . Hypertension Father   . Hyperlipidemia Father   . Coronary artery disease Father 51       History of CABG  . Stroke Father   . Hypertension Brother   . Diabetes Brother   . Hypertension Daughter   . Hypertension Son   . Hypertension Brother   . Hypertension Brother    Past Surgical History:  Procedure Laterality Date  . APPENDECTOMY  age 59  . CYSTOSCOPY WITH URETEROSCOPY AND STENT PLACEMENT Left 05/08/2017   Procedure: URETEROSCOPY AND STENT PLACEMENT;  Surgeon: Ihor Gully, MD;  Location[facility];  Service: Urology;  Laterality: Left;  . CYSTOSCOPY/RETROGRADE/URETEROSCOPY Bilateral 05/08/2017   Procedure: CYSTOSCOPY/ BILATERAL RETROGRADE;  Surgeon: Ihor Gully, MD;  Location: [facility];  Service: Urology;  Laterality: Bilateral;  . INGUINAL HERNIA REPAIR Right 10/10/2000  . KNEE ARTHROSCOPY Right 1980's  . NASAL FRACTURE  SURGERY     in highschool   . SHOULDER ARTHROSCOPY WITH OPEN ROTATOR CUFF REPAIR Right 1990's  . TRANSURETHRAL RESECTION OF PROSTATE     Social History   Social History Narrative  . Not on file   Immunization History  Administered Date(s) Administered  . Influenza, High Dose Seasonal PF 10/12/2018  . Influenza,inj,Quad PF,6+ Mos 09/26/2017  . Influenza-Unspecified 09/26/2017  . Pneumococcal Conjugate-13 11/10/2017  . Tdap 11/02/2018     Objective: Vital  Signs: There were no vitals taken for this visit.   Physical Exam   Musculoskeletal Exam: ***  CDAI Exam: CDAI Score: Not documented Patient Global Assessment: Not documented; Provider Global Assessment: Not documented Swollen: Not documented; Tender: Not documented Joint Exam   Not documented   There is currently no information documented on the homunculus. Go to the Rheumatology activity and complete the homunculus joint exam.  Investigation: No additional findings.  Imaging: No results found.  Recent Labs: Lab Results  Component Value Date   WBC 6.0 01/28/2019   HGB 15.8 01/28/2019   PLT 256 12/17/2018   NA 144 01/28/2019   K 4.1 01/28/2019   CL 104 01/28/2019   CO2 19 (L) 01/28/2019   GLUCOSE 137 (H) 01/28/2019   BUN 41 (H) 01/28/2019   CREATININE 1.29 (H) 01/28/2019   BILITOT 0.3 01/28/2019   ALKPHOS 80 01/28/2019   AST 49 (H) 01/28/2019   ALT 43 01/28/2019   PROT 6.9 01/28/2019   ALBUMIN 4.4 01/28/2019   CALCIUM 9.6 01/28/2019   GFRAA 66 01/28/2019    Speciality Comments: No specialty comments available.  Procedures:  No procedures performed Allergies: Metformin and related and Sulfa antibiotics   Assessment / Plan:     Visit Diagnoses: No diagnosis found.   Orders: No orders of the defined types were placed in this encounter.  No orders of the defined types were placed in this encounter.   Face-to-face time spent with patient was *** minutes. Greater than 50% of time was spent in counseling and coordination of care.  Follow-Up Instructions: No follow-ups on file.   Ellen Henri, CMA  Note - This record has been created using Animal nutritionist.  Chart creation errors have been sought, but may not always  have been located. Such creation errors do not reflect on  the standard of medical care.

## 2019-03-06 ENCOUNTER — Ambulatory Visit (INDEPENDENT_AMBULATORY_CARE_PROVIDER_SITE_OTHER): Payer: PPO | Admitting: Family Medicine

## 2019-03-06 ENCOUNTER — Encounter: Payer: Self-pay | Admitting: Family Medicine

## 2019-03-06 ENCOUNTER — Other Ambulatory Visit: Payer: Self-pay

## 2019-03-06 VITALS — BP 138/74 | HR 71 | Temp 97.8°F | Ht 65.0 in | Wt 188.2 lb

## 2019-03-06 DIAGNOSIS — E1169 Type 2 diabetes mellitus with other specified complication: Secondary | ICD-10-CM | POA: Diagnosis not present

## 2019-03-06 DIAGNOSIS — Z794 Long term (current) use of insulin: Secondary | ICD-10-CM

## 2019-03-06 DIAGNOSIS — E785 Hyperlipidemia, unspecified: Secondary | ICD-10-CM | POA: Diagnosis not present

## 2019-03-06 DIAGNOSIS — M25519 Pain in unspecified shoulder: Secondary | ICD-10-CM | POA: Diagnosis not present

## 2019-03-06 DIAGNOSIS — N183 Chronic kidney disease, stage 3 (moderate): Secondary | ICD-10-CM | POA: Diagnosis not present

## 2019-03-06 DIAGNOSIS — I10 Essential (primary) hypertension: Secondary | ICD-10-CM | POA: Diagnosis not present

## 2019-03-06 DIAGNOSIS — G4700 Insomnia, unspecified: Secondary | ICD-10-CM | POA: Diagnosis not present

## 2019-03-06 DIAGNOSIS — I152 Hypertension secondary to endocrine disorders: Secondary | ICD-10-CM

## 2019-03-06 DIAGNOSIS — J329 Chronic sinusitis, unspecified: Secondary | ICD-10-CM | POA: Diagnosis not present

## 2019-03-06 DIAGNOSIS — E1159 Type 2 diabetes mellitus with other circulatory complications: Secondary | ICD-10-CM

## 2019-03-06 DIAGNOSIS — F419 Anxiety disorder, unspecified: Secondary | ICD-10-CM

## 2019-03-06 DIAGNOSIS — E1122 Type 2 diabetes mellitus with diabetic chronic kidney disease: Secondary | ICD-10-CM | POA: Diagnosis not present

## 2019-03-06 DIAGNOSIS — M25561 Pain in right knee: Secondary | ICD-10-CM

## 2019-03-06 DIAGNOSIS — M1A09X0 Idiopathic chronic gout, multiple sites, without tophus (tophi): Secondary | ICD-10-CM

## 2019-03-06 MED ORDER — DICLOFENAC SODIUM 75 MG PO TBEC: 75.0000 mg | DELAYED_RELEASE_TABLET | Freq: Two times a day (BID) | ORAL | 0 refills | Status: DC

## 2019-03-06 MED ORDER — ALLOPURINOL 100 MG PO TABS: 300.0000 mg | ORAL_TABLET | Freq: Every day | ORAL | 0 refills | Status: DC

## 2019-03-06 MED ORDER — IPRATROPIUM BROMIDE 0.06 % NA SOLN: 2.0000 | Freq: Four times a day (QID) | NASAL | 0 refills | Status: DC

## 2019-03-06 MED ORDER — TRAZODONE HCL 50 MG PO TABS: 25.0000 mg | ORAL_TABLET | Freq: Every evening | ORAL | 3 refills | Status: DC | PRN

## 2019-03-06 MED ORDER — METOPROLOL TARTRATE 100 MG PO TABS: 100.0000 mg | ORAL_TABLET | Freq: Two times a day (BID) | ORAL | 3 refills | Status: DC

## 2019-03-06 MED ORDER — AMLODIPINE BESYLATE 5 MG PO TABS: 5.0000 mg | ORAL_TABLET | Freq: Every day | ORAL | 3 refills | Status: DC

## 2019-03-06 MED ORDER — ATORVASTATIN CALCIUM 20 MG PO TABS: 20.0000 mg | ORAL_TABLET | Freq: Every evening | ORAL | 3 refills | Status: DC

## 2019-03-06 MED ORDER — AMOXICILLIN-POT CLAVULANATE 875-125 MG PO TABS: 1.0000 | ORAL_TABLET | Freq: Two times a day (BID) | ORAL | 0 refills | Status: DC

## 2019-03-06 MED ORDER — DEXCOM G6 SENSOR MISC: 1.0000 "application " | Freq: Every day | 5 refills | Status: DC

## 2019-03-06 MED ORDER — FUROSEMIDE 80 MG PO TABS: 80.0000 mg | ORAL_TABLET | Freq: Two times a day (BID) | ORAL | 3 refills | Status: DC

## 2019-03-06 NOTE — Assessment & Plan Note (Signed)
Refilled Klonopin.  Database reviewed without red flags.

## 2019-03-06 NOTE — Assessment & Plan Note (Signed)
At goal.  Continue amlodipine 5 mg daily and metoprolol tartrate 100 mg twice daily. °

## 2019-03-06 NOTE — Assessment & Plan Note (Signed)
Continue Lipitor 20 mg daily. 

## 2019-03-06 NOTE — Progress Notes (Signed)
Chief Complaint:  Jerry York is a 68 y.o. male who presents today with a chief complaint of sinus congestion.   Assessment/Plan:  Sinusitis Start Atrovent and Augmentin.  Encouraged good oral hydration.  Discussed reasons return to care.  Follow-up as needed.  Shoulder pain Likely secondary to underlying osteoarthritis and prior rotator cuff issues.  Will start diclofenac 75 mg twice daily as needed.  Follow-up with sports medicine no improvement with this.  Type 2 diabetes mellitus (HCC) Refilled glucometer.  Advised him to follow-up with endocrinology soon.  Gout Stable.  Continue allopurinol and colchicine.  Hypertension associated with diabetes (HCC) At goal.  Continue amlodipine 5 mg daily and metoprolol tartrate 100 mg twice daily.  Hyperlipidemia associated with type 2 diabetes mellitus (HCC) Continue Lipitor 20 mg daily.  Anxiety Refilled Klonopin.  Database reviewed without red flags.     Subjective:  HPI:  Sinus Drainage Started about 2 months ago.  Stable over the last few weeks.  Associated with postnasal drip and sore throat.  Tried several over-the-counter medications which have not helped.  No known sick contacts.  No fever or chills.  No other obvious alleviating or aggravating factors.  Insomnia Several year history.  Worsened recently.  Has tried several over-the-counter medications with no improvement.  Typically sleeps 4 to 5 hours at a time.  Has difficulty staying asleep and falling asleep.  Has never been on any prescription medications for this in the past.  Shoulder Pain Has follow-up with orthopedics in the past.  Symptoms seem to be worsening.  His stable, chronic medical conditions are outlined below:  #Essential hypertension -On amlodipine 5 mg daily and metoprolol tartrate 100 mg twice daily and tolerating well. - ROS: No reported chest pain or shortness of breath.  # Dyslipidemia -On Lipitor 20 mg daily and tolerating well. -  ROS: No reported myalgias.  # Anxiety -Klonopin 0.5 mg twice daily as needed.  Tolerating well without side effects.  % Gout -Follows with rheumatology On allopurinol 300 mg daily and colchicine 0.6 mg daily.  % T2DM -Follows with endocrinology for this.  On Humalog 40 units 3 times daily with meals.  ROS: Per HPI  PMH: He reports that he has never smoked. He has never used smokeless tobacco. He reports that he does not drink alcohol or use drugs.      Objective:  Physical Exam: BP 138/74 (BP Location: Left Arm, Patient Position: Sitting, Cuff Size: Normal)   Pulse 71   Temp 97.8 F (36.6 C) (Oral)   Ht 5\' 5"  (1.651 m)   Wt 188 lb 3.2 oz (85.4 kg)   SpO2 94%   BMI 31.32 kg/m   Gen: NAD, resting comfortably HEENT: EACs obstructed by cerumen bilaterally.  Nasal coast erythematous and boggy with clear discharge. CV: Regular rate and rhythm with no murmurs appreciated Pulm: Normal work of breathing, clear to auscultation bilaterally with no crackles, wheezes, or rhonchi GI: Normal bowel sounds present. Soft, Nontender, Nondistended. MSK: No edema, cyanosis, or clubbing noted.  Bilateral shoulder weakness and pain elicited with resisted abduction and extension. Skin: Warm, dry Neuro: Grossly normal, moves all extremities Psych: Normal affect and thought content  Impacted Cerumen Irrigation Procedure Note Patient has impacted cerumen based on diagnostic criteria of visual inspection and hearing loss.  Irrigation was recommended.  Verbal consent was obtained.  The EACs were then irrigated with successful removal of impacted cerumen.  Patient tolerated well without complications.      [facility]  Doretha Imus, MD 03/06/2019 12:32 PM

## 2019-03-06 NOTE — Assessment & Plan Note (Signed)
Refilled glucometer.  Advised him to follow-up with endocrinology soon.

## 2019-03-06 NOTE — Patient Instructions (Addendum)
It was very nice to see you today!  Please start Atrovent and Augmentin.  Start trazodone at night for your sleep.  Take the diclofenac as needed for your shoulders.  I will refill your other medications.  Come back to see me in 6 months, or sooner as needed.  Take care, Dr [name]

## 2019-03-06 NOTE — Assessment & Plan Note (Signed)
Stable.  Continue allopurinol and colchicine.

## 2019-03-11 ENCOUNTER — Other Ambulatory Visit: Payer: Self-pay | Admitting: Family Medicine

## 2019-03-11 ENCOUNTER — Ambulatory Visit: Payer: PPO | Admitting: Family Medicine

## 2019-03-12 ENCOUNTER — Encounter: Payer: Self-pay | Admitting: Family Medicine

## 2019-03-14 ENCOUNTER — Encounter: Payer: Self-pay | Admitting: Family Medicine

## 2019-03-14 ENCOUNTER — Other Ambulatory Visit: Payer: Self-pay

## 2019-03-14 MED ORDER — TRAZODONE HCL 50 MG PO TABS: 150.0000 mg | ORAL_TABLET | Freq: Every evening | ORAL | 3 refills | Status: DC | PRN

## 2019-03-18 ENCOUNTER — Ambulatory Visit (INDEPENDENT_AMBULATORY_CARE_PROVIDER_SITE_OTHER): Payer: PPO | Admitting: Family Medicine

## 2019-03-18 ENCOUNTER — Ambulatory Visit: Payer: PPO | Admitting: Rheumatology

## 2019-03-20 ENCOUNTER — Encounter (INDEPENDENT_AMBULATORY_CARE_PROVIDER_SITE_OTHER): Payer: Self-pay | Admitting: Family Medicine

## 2019-03-20 ENCOUNTER — Ambulatory Visit (INDEPENDENT_AMBULATORY_CARE_PROVIDER_SITE_OTHER): Payer: PPO | Admitting: Family Medicine

## 2019-03-20 ENCOUNTER — Other Ambulatory Visit: Payer: Self-pay | Admitting: Family Medicine

## 2019-03-20 ENCOUNTER — Other Ambulatory Visit: Payer: Self-pay

## 2019-03-20 DIAGNOSIS — Z6832 Body mass index (BMI) 32.0-32.9, adult: Secondary | ICD-10-CM | POA: Diagnosis not present

## 2019-03-20 DIAGNOSIS — E7849 Other hyperlipidemia: Secondary | ICD-10-CM

## 2019-03-20 DIAGNOSIS — Z794 Long term (current) use of insulin: Secondary | ICD-10-CM | POA: Diagnosis not present

## 2019-03-20 DIAGNOSIS — E1165 Type 2 diabetes mellitus with hyperglycemia: Secondary | ICD-10-CM | POA: Diagnosis not present

## 2019-03-20 DIAGNOSIS — E669 Obesity, unspecified: Secondary | ICD-10-CM | POA: Diagnosis not present

## 2019-03-21 ENCOUNTER — Encounter (INDEPENDENT_AMBULATORY_CARE_PROVIDER_SITE_OTHER): Payer: Self-pay

## 2019-03-21 NOTE — Progress Notes (Signed)
Office: [phone]  /  Fax: [phone] TeleHealth Visit:  Jerry York has consented to this TeleHealth visit today via telephone. The patient is located at home, the provider is located at the [employer] [facility] office. The participants in this visit include the listed provider and patient and provider's assistant. Time spent on visit was 20 minutes  HPI:   Chief Complaint: OBESITY Jerry York is here to discuss his progress with his obesity treatment plan. He is on the keep a food journal with 400-500 calories and 35+ grams of protein at supper daily and follow the Category 2 plan + 100 calories and is following his eating plan approximately 25 % of the time. He states he is exercising 0 minutes 0 times per week. Jerry York saw his doctor early March for sinusitis. He is staying home with his family, except for grocery pickup and driving to [place]. He is still struggling to find protein secondary to COVID-19. He is half following the meal plan, eating more yogurt and balance bars. He has done a bit of journaling in terms of substitution. We were unable to weight the patient today for this TeleHealth visit. He feels as if he has gained 1 lb since his last visit. He has lost 0 lbs since starting treatment with [name].  Diabetes II with Hyperglycemia Jerry York has a diagnosis of diabetes type II. Jerry York states BGs have been low of 67 and high of 230. He states fasting BGs range between 150 and 190. He is doing 50 units of insulin TID with meals. He is encouraged to follow up with Endocrinology. He has been working on intensive lifestyle modifications including diet, exercise, and weight loss to help control his blood glucose levels.  Hyperlipidemia Jerry York has hyperlipidemia and has been trying to improve his cholesterol levels with intensive lifestyle modification including a low saturated fat diet, exercise and weight loss. He is on statin and denies any chest pain, claudication or myalgias.   ASSESSMENT AND PLAN:  Type 2 diabetes mellitus with hyperglycemia, with long-term current use of insulin (HCC)  Other hyperlipidemia  Class 1 obesity with serious comorbidity and body mass index (BMI) of 32.0 to 32.9 in adult, unspecified obesity type  PLAN:  Diabetes II with Hyperglycemia Jerry York has been given extensive diabetes education by myself today including ideal fasting and post-prandial blood glucose readings, individual ideal Hgb A1c goals and hypoglycemia prevention. We discussed the importance of good blood sugar control to decrease the likelihood of diabetic complications such as nephropathy, neuropathy, limb loss, blindness, coronary artery disease, and death. We discussed the importance of intensive lifestyle modification including diet, exercise and weight loss as the first line treatment for diabetes. Payam agrees to continue his diabetes medications and he will follow up with Endocrinology in May. Jerry York agrees to follow up with our clinic in 2 weeks.  Hyperlipidemia Jerry York was informed of the American Heart Association Guidelines emphasizing intensive lifestyle modifications as the first line treatment for hyperlipidemia. We discussed many lifestyle modifications today in depth, and Tovia will continue to work on decreasing saturated fats such as fatty red meat, butter and many fried foods. He will also increase vegetables and lean protein in his diet and continue to work on exercise and weight loss efforts. We will repeat labs in mid May. Jerry York agrees to follow up with our clinic in 2 weeks.  Obesity Jerry York is currently in the action stage of change. As such, his goal is to continue with weight loss efforts He  has agreed to keep a food journal with 1150-1300 calories and 85+ grams of protein daily Jerry York has been instructed to work up to a goal of 150 minutes of combined cardio and strengthening exercise per week for weight loss and overall health benefits. We discussed  the following Behavioral Modification Strategies today: increasing lean protein intake, increasing vegetables and work on meal planning and easy cooking plans, better snacking choices, and planning for success   Jerry York has agreed to follow up with our clinic in 2 weeks. He was informed of the importance of frequent follow up visits to maximize his success with intensive lifestyle modifications for his multiple health conditions.  ALLERGIES: Allergies  Allergen Reactions  . Metformin And Related Diarrhea  . Sulfa Antibiotics Other (See Comments)    "make my feet burn"    MEDICATIONS: Current Outpatient Medications on File Prior to Visit  Medication Sig Dispense Refill  . acetaminophen (TYLENOL) 325 MG tablet Take 650 mg by mouth every 6 (six) hours as needed for moderate pain.    [name] allopurinol (ZYLOPRIM) 100 MG tablet Take 3 tablets (300 mg total) by mouth daily. 270 tablet 0  . amLODipine (NORVASC) 5 MG tablet Take 1 tablet (5 mg total) by mouth daily. 90 tablet 3  . amoxicillin-clavulanate (AUGMENTIN) 875-125 MG tablet Take 1 tablet by mouth 2 (two) times daily. 14 tablet 0  . atorvastatin (LIPITOR) 20 MG tablet Take 1 tablet (20 mg total) by mouth every evening. 90 tablet 3  . [name] Take 5,000 mg by mouth 2 (two) times daily.     . clonazePAM (KLONOPIN) 0.5 MG tablet TAKE ONE TABLET BY MOUTH TWICE A DAY 180 tablet 0  . colchicine 0.6 MG tablet Take 1 tablet (0.6 mg total) by mouth daily. Take BID if experiencing a flare. 60 tablet 1  . diclofenac (VOLTAREN) 75 MG EC tablet Take 1 tablet (75 mg total) by mouth 2 (two) times daily. 30 tablet 0  . furosemide (LASIX) 80 MG tablet Take 1 tablet (80 mg total) by mouth 2 (two) times daily. 180 tablet 3  . glucose blood ([name] TEST STRIPS) test strip Check blood sugars 2-3 times per day. DX: E11.9 300 each 2  . glucose monitoring kit ([name]) monitoring kit 1 each by Does not apply route as needed for other. DX Code: E11.9 1 each 0   . insulin lispro (HUMALOG) 100 UNIT/ML injection Inject 40 Units into the skin 3 (three) times daily before meals.    [name] ipratropium (ATROVENT) 0.06 % nasal spray Place 2 sprays into both nostrils 4 (four) times daily. 15 mL 0  . metoprolol tartrate (LOPRESSOR) 100 MG tablet Take 1 tablet (100 mg total) by mouth 2 (two) times daily. 180 tablet 3  . NON FORMULARY Rehmannia 6 1315m TID    . traZODone (DESYREL) 50 MG tablet Take 3 tablets (150 mg total) by mouth at bedtime as needed for sleep. 180 tablet 3   No current facility-administered medications on file prior to visit.     PAST MEDICAL HISTORY: Past Medical History:  Diagnosis Date  . Anxiety   . Bilateral renal cysts   . Foley catheter in place   . Gout    02-22-2017 acute right foot gout---  per pt resolved  . Gout   . Gross hematuria   . Heart murmur   . History of BPH 08/14/2017  . Hydronephrosis, left   . Hyperlipidemia   . Hypertension   . Renal insufficiency   .  Rheumatoid arthritis ([place])   . Swelling   . Type 2 diabetes mellitus ([place])   . Urinary retention   . Wears glasses     PAST SURGICAL HISTORY: Past Surgical History:  Procedure Laterality Date  . APPENDECTOMY  age 66  . CYSTOSCOPY WITH URETEROSCOPY AND STENT PLACEMENT Left 05/08/2017   Procedure: URETEROSCOPY AND STENT PLACEMENT;  Surgeon: Kathie Rhodes, MD;  Location: [facility];  Service: Urology;  Laterality: Left;  . CYSTOSCOPY/RETROGRADE/URETEROSCOPY Bilateral 05/08/2017   Procedure: CYSTOSCOPY/ BILATERAL RETROGRADE;  Surgeon: Kathie Rhodes, MD;  Location: [facility];  Service: Urology;  Laterality: Bilateral;  . INGUINAL HERNIA REPAIR Right 10/10/2000  . KNEE ARTHROSCOPY Right 1980's  . NASAL FRACTURE SURGERY     in highschool   . SHOULDER ARTHROSCOPY WITH OPEN ROTATOR CUFF REPAIR Right 1990's  . TRANSURETHRAL RESECTION OF PROSTATE      SOCIAL HISTORY: Social History   Tobacco Use  . Smoking status: Never Smoker   . Smokeless tobacco: Never Used  Substance Use Topics  . Alcohol use: No  . Drug use: No    FAMILY HISTORY: Family History  Problem Relation Age of Onset  . Congestive Heart Failure Mother        Related to valve disease.  Opted to treat medically  . Hypertension Mother   . Hyperlipidemia Mother   . Diabetes Mother   . Hypertension Father   . Hyperlipidemia Father   . Coronary artery disease Father 63       History of CABG  . Stroke Father   . Hypertension Brother   . Diabetes Brother   . Hypertension Daughter   . Hypertension Son   . Hypertension Brother   . Hypertension Brother     ROS: Review of Systems  Constitutional: Negative for weight loss.  Cardiovascular: Negative for chest pain and claudication.  Musculoskeletal: Negative for myalgias.  Endo/Heme/Allergies:       Negative hypoglycemia    PHYSICAL EXAM: Pt in no acute distress  RECENT LABS AND TESTS: BMET    Component Value Date/Time   NA 144 01/28/2019 1413   K 4.1 01/28/2019 1413   CL 104 01/28/2019 1413   CO2 19 (L) 01/28/2019 1413   GLUCOSE 137 (H) 01/28/2019 1413   GLUCOSE 107 (H) 12/17/2018 1004   BUN 41 (H) 01/28/2019 1413   CREATININE 1.29 (H) 01/28/2019 1413   CREATININE 1.45 (H) 12/17/2018 1004   CALCIUM 9.6 01/28/2019 1413   GFRNONAA 57 (L) 01/28/2019 1413   GFRNONAA 50 (L) 12/17/2018 1004   GFRAA 66 01/28/2019 1413   GFRAA 58 (L) 12/17/2018 1004   Lab Results  Component Value Date   HGBA1C 6.7 (H) 08/14/2017   HGBA1C 8.3 (H) 01/15/2007   No results found for: INSULIN CBC    Component Value Date/Time   WBC 6.0 01/28/2019 1413   WBC 5.8 12/17/2018 1004   RBC 4.85 01/28/2019 1413   RBC 4.81 12/17/2018 1004   HGB 15.8 01/28/2019 1413   HCT 43.0 01/28/2019 1413   PLT 256 12/17/2018 1004   MCV 89 01/28/2019 1413   MCH 32.6 01/28/2019 1413   MCH 30.1 12/17/2018 1004   MCHC 36.7 (H) 01/28/2019 1413   MCHC 34.5 12/17/2018 1004   RDW 16.8 (H) 01/28/2019 1413   LYMPHSABS 1.9  01/28/2019 1413   MONOABS 0.8 11/03/2018 1407   EOSABS 0.1 01/28/2019 1413   BASOSABS 0.0 01/28/2019 1413   Iron/TIBC/Ferritin/ %Sat No results found for: IRON, TIBC,  FERRITIN, IRONPCTSAT Lipid Panel     Component Value Date/Time   CHOL 278 (H) 01/28/2019 1413   TRIG 2,296 (HH) 01/28/2019 1413   HDL 14 (L) 01/28/2019 1413   CHOLHDL 4 11/21/2018 1102   VLDL 71.8 (H) 11/21/2018 1102   LDLCALC Comment 01/28/2019 1413   LDLDIRECT 63.0 11/21/2018 1102   Hepatic Function Panel     Component Value Date/Time   PROT 6.9 01/28/2019 1413   ALBUMIN 4.4 01/28/2019 1413   AST 49 (H) 01/28/2019 1413   ALT 43 01/28/2019 1413   ALKPHOS 80 01/28/2019 1413   BILITOT 0.3 01/28/2019 1413   BILIDIR 0.1 09/04/2008 0953      Component Value Date/Time   TSH 3.370 01/28/2019 1413   TSH 1.78 01/15/2007 0829      I, Trixie Dredge, am acting as transcriptionist for Ilene Qua, MD  I have reviewed the above documentation for accuracy and completeness, and I agree with the above. - Ilene Qua, MD

## 2019-04-04 ENCOUNTER — Ambulatory Visit (INDEPENDENT_AMBULATORY_CARE_PROVIDER_SITE_OTHER): Payer: PPO | Admitting: Family Medicine

## 2019-04-04 ENCOUNTER — Encounter (INDEPENDENT_AMBULATORY_CARE_PROVIDER_SITE_OTHER): Payer: Self-pay | Admitting: Family Medicine

## 2019-04-04 ENCOUNTER — Other Ambulatory Visit: Payer: Self-pay

## 2019-04-04 DIAGNOSIS — E669 Obesity, unspecified: Secondary | ICD-10-CM | POA: Diagnosis not present

## 2019-04-04 DIAGNOSIS — E1165 Type 2 diabetes mellitus with hyperglycemia: Secondary | ICD-10-CM | POA: Diagnosis not present

## 2019-04-04 DIAGNOSIS — Z6831 Body mass index (BMI) 31.0-31.9, adult: Secondary | ICD-10-CM | POA: Diagnosis not present

## 2019-04-04 DIAGNOSIS — I10 Essential (primary) hypertension: Secondary | ICD-10-CM

## 2019-04-04 DIAGNOSIS — Z794 Long term (current) use of insulin: Secondary | ICD-10-CM | POA: Diagnosis not present

## 2019-04-06 ENCOUNTER — Encounter: Payer: Self-pay | Admitting: Family Medicine

## 2019-04-07 ENCOUNTER — Other Ambulatory Visit: Payer: Self-pay | Admitting: Family Medicine

## 2019-04-08 ENCOUNTER — Other Ambulatory Visit: Payer: Self-pay

## 2019-04-08 MED ORDER — ALLOPURINOL 100 MG PO TABS: 300.0000 mg | ORAL_TABLET | Freq: Every day | ORAL | 0 refills | Status: DC

## 2019-04-08 MED ORDER — FREESTYLE LIBRE 14 DAY SENSOR MISC: 1.0000 | 3 refills | Status: DC

## 2019-04-08 NOTE — Progress Notes (Signed)
Office: [phone]  /  Fax: [phone] TeleHealth Visit:  Jerry York has verbally consented to this TeleHealth visit today. The patient is located at home, the provider is located at the [employer] [facility] office. The participants in this visit include the listed provider and patient and provider's assistant. The visit was conducted today via skype.  HPI:   Chief Complaint: OBESITY Jerry York is here to discuss his progress with his obesity treatment plan. He is on the keep a food journal with 1150-1300 calories and 85+ grams of protein daily and is following his eating plan approximately 90 % of the time. He states he is riding stationary bike for 20 minutes 4 times per week. Jerry York is finally able to find a good amount of protein and only struggling to find bread. He notes self quarantine has not affected him much.  We were unable to weigh the patient today for this TeleHealth visit. He feels as if he has maintained his weight since his last visit. He has lost 0 lbs since starting treatment with [name].  Diabetes II with Hyperglycemia Jerry York has a diagnosis of diabetes type II. Jerry York states fasting BGs range between 150 and 200, lowest reading was of 95. He denies hypoglycemia. He is still taking the same amount of insulin, only doing BID dosing. He has been working on intensive lifestyle modifications including diet, exercise, and weight loss to help control his blood glucose levels.  Hypertension Jerry York is a 68 y.o. male with hypertension. Jerry York's blood pressure at home is 130/80. He denies chest pain, chest pressure, or headaches. He is working on weight loss to help control his blood pressure with the goal of decreasing his risk of heart attack and stroke.   ASSESSMENT AND PLAN:  Type 2 diabetes mellitus with hyperglycemia, with long-term current use of insulin (HCC)  Essential hypertension  Class 1 obesity with serious comorbidity and body mass index (BMI) of  31.0 to 31.9 in adult, unspecified obesity type  PLAN:  Diabetes II with Hyperglycemia Jerry York has been given extensive diabetes education by myself today including ideal fasting and post-prandial blood glucose readings, individual ideal Hgb A1c goals and hypoglycemia prevention. We discussed the importance of good blood sugar control to decrease the likelihood of diabetic complications such as nephropathy, neuropathy, limb loss, blindness, coronary artery disease, and death. We discussed the importance of intensive lifestyle modification including diet, exercise and weight loss as the first line treatment for diabetes. Jerry York agrees to continue his current insulin dosing, and he agrees to follow up with our clinic in 2 weeks.  Hypertension We discussed sodium restriction, working on healthy weight loss, and a regular exercise program as the means to achieve improved blood pressure control. Jerry York agreed with this plan and agreed to follow up as directed. We will continue to monitor his blood pressure as well as his progress with the above lifestyle modifications. Jerry York agrees to continue his current medications, no refill needed and will watch for signs of hypotension as he continues his lifestyle modifications. Jerry York agrees to follow up with our clinic in 2 weeks.  Obesity Jerry York is currently in the action stage of change. As such, his goal is to continue with weight loss efforts He has agreed to follow the Category 2 plan Jerry York has been instructed to work up to a goal of 150 minutes of combined cardio and strengthening exercise per week for weight loss and overall health benefits. We discussed the following Behavioral Modification Strategies  today: increasing lean protein intake, increasing vegetables, work on meal planning and easy cooking plans, emotional eating strategies, ways to avoid night time snacking, and planning for success   Jerry York has agreed to follow up with our clinic in 2  weeks. He was informed of the importance of frequent follow up visits to maximize his success with intensive lifestyle modifications for his multiple health conditions.  ALLERGIES: Allergies  Allergen Reactions  . Metformin And Related Diarrhea  . Sulfa Antibiotics Other (See Comments)    "make my feet burn"    MEDICATIONS: Current Outpatient Medications on File Prior to Visit  Medication Sig Dispense Refill  . acetaminophen (TYLENOL) 325 MG tablet Take 650 mg by mouth every 6 (six) hours as needed for moderate pain.    [name] allopurinol (ZYLOPRIM) 100 MG tablet Take 3 tablets (300 mg total) by mouth daily. 270 tablet 0  . amLODipine (NORVASC) 5 MG tablet Take 1 tablet (5 mg total) by mouth daily. 90 tablet 3  . amoxicillin-clavulanate (AUGMENTIN) 875-125 MG tablet Take 1 tablet by mouth 2 (two) times daily. 14 tablet 0  . atorvastatin (LIPITOR) 20 MG tablet Take 1 tablet (20 mg total) by mouth every evening. 90 tablet 3  . [name] Take 5,000 mg by mouth 2 (two) times daily.     . clonazePAM (KLONOPIN) 0.5 MG tablet TAKE ONE TABLET BY MOUTH TWICE A DAY 180 tablet 0  . colchicine 0.6 MG tablet Take 1 tablet (0.6 mg total) by mouth daily. Take BID if experiencing a flare. 60 tablet 1  . Continuous Blood Gluc Sensor ([name] 14 DAY SENSOR) MISC USE AS DIRECTED FOR  14  DAYS 1 each 0  . diclofenac (VOLTAREN) 75 MG EC tablet Take 1 tablet (75 mg total) by mouth 2 (two) times daily. 30 tablet 0  . furosemide (LASIX) 80 MG tablet Take 1 tablet (80 mg total) by mouth 2 (two) times daily. 180 tablet 3  . glucose blood ([name] TEST STRIPS) test strip Check blood sugars 2-3 times per day. DX: E11.9 300 each 2  . glucose monitoring kit ([name]) monitoring kit 1 each by Does not apply route as needed for other. DX Code: E11.9 1 each 0  . insulin lispro (HUMALOG) 100 UNIT/ML injection Inject 40 Units into the skin 3 (three) times daily before meals.    [name] ipratropium (ATROVENT) 0.06 % nasal  spray Place 2 sprays into both nostrils 4 (four) times daily. 15 mL 0  . metoprolol tartrate (LOPRESSOR) 100 MG tablet Take 1 tablet (100 mg total) by mouth 2 (two) times daily. 180 tablet 3  . NON FORMULARY Rehmannia 6 1314m TID    . traZODone (DESYREL) 50 MG tablet Take 3 tablets (150 mg total) by mouth at bedtime as needed for sleep. 180 tablet 3   No current facility-administered medications on file prior to visit.     PAST MEDICAL HISTORY: Past Medical History:  Diagnosis Date  . Anxiety   . Bilateral renal cysts   . Foley catheter in place   . Gout    02-22-2017 acute right foot gout---  per pt resolved  . Gout   . Gross hematuria   . Heart murmur   . History of BPH 08/14/2017  . Hydronephrosis, left   . Hyperlipidemia   . Hypertension   . Renal insufficiency   . [place]   . Swelling   . Type 2 diabetes mellitus ([place]   . Urinary retention   .  Wears glasses     PAST SURGICAL HISTORY: Past Surgical History:  Procedure Laterality Date  . APPENDECTOMY  age 65  . CYSTOSCOPY WITH URETEROSCOPY AND STENT PLACEMENT Left 05/08/2017   Procedure: URETEROSCOPY AND STENT PLACEMENT;  Surgeon: Kathie Rhodes, MD;  Location: [facility];  Service: Urology;  Laterality: Left;  . CYSTOSCOPY/RETROGRADE/URETEROSCOPY Bilateral 05/08/2017   Procedure: CYSTOSCOPY/ BILATERAL RETROGRADE;  Surgeon: Kathie Rhodes, MD;  Location: [facility];  Service: Urology;  Laterality: Bilateral;  . INGUINAL HERNIA REPAIR Right 10/10/2000  . KNEE ARTHROSCOPY Right 1980's  . NASAL FRACTURE SURGERY     in highschool   . SHOULDER ARTHROSCOPY WITH OPEN ROTATOR CUFF REPAIR Right 1990's  . TRANSURETHRAL RESECTION OF PROSTATE      SOCIAL HISTORY: Social History   Tobacco Use  . Smoking status: Never Smoker  . Smokeless tobacco: Never Used  Substance Use Topics  . Alcohol use: No  . Drug use: No    FAMILY HISTORY: Family History  Problem Relation Age of  Onset  . Congestive Heart Failure Mother        Related to valve disease.  Opted to treat medically  . Hypertension Mother   . Hyperlipidemia Mother   . Diabetes Mother   . Hypertension Father   . Hyperlipidemia Father   . Coronary artery disease Father 92       History of CABG  . Stroke Father   . Hypertension Brother   . Diabetes Brother   . Hypertension Daughter   . Hypertension Son   . Hypertension Brother   . Hypertension Brother     ROS: Review of Systems  Constitutional: Negative for weight loss.  Cardiovascular: Negative for chest pain.       Negative chest pressure  Neurological: Negative for headaches.  Endo/Heme/Allergies:       Negative hypoglycemia    PHYSICAL EXAM: Pt in no acute distress  RECENT LABS AND TESTS: BMET    Component Value Date/Time   NA 144 01/28/2019 1413   K 4.1 01/28/2019 1413   CL 104 01/28/2019 1413   CO2 19 (L) 01/28/2019 1413   GLUCOSE 137 (H) 01/28/2019 1413   GLUCOSE 107 (H) 12/17/2018 1004   BUN 41 (H) 01/28/2019 1413   CREATININE 1.29 (H) 01/28/2019 1413   CREATININE 1.45 (H) 12/17/2018 1004   CALCIUM 9.6 01/28/2019 1413   GFRNONAA 57 (L) 01/28/2019 1413   GFRNONAA 50 (L) 12/17/2018 1004   GFRAA 66 01/28/2019 1413   GFRAA 58 (L) 12/17/2018 1004   Lab Results  Component Value Date   HGBA1C 6.7 (H) 08/14/2017   HGBA1C 8.3 (H) 01/15/2007   No results found for: INSULIN CBC    Component Value Date/Time   WBC 6.0 01/28/2019 1413   WBC 5.8 12/17/2018 1004   RBC 4.85 01/28/2019 1413   RBC 4.81 12/17/2018 1004   HGB 15.8 01/28/2019 1413   HCT 43.0 01/28/2019 1413   PLT 256 12/17/2018 1004   MCV 89 01/28/2019 1413   MCH 32.6 01/28/2019 1413   MCH 30.1 12/17/2018 1004   MCHC 36.7 (H) 01/28/2019 1413   MCHC 34.5 12/17/2018 1004   RDW 16.8 (H) 01/28/2019 1413   LYMPHSABS 1.9 01/28/2019 1413   MONOABS 0.8 11/03/2018 1407   EOSABS 0.1 01/28/2019 1413   BASOSABS 0.0 01/28/2019 1413   Iron/TIBC/Ferritin/ %Sat No  results found for: IRON, TIBC, FERRITIN, IRONPCTSAT Lipid Panel     Component Value Date/Time   CHOL 278 (H)  01/28/2019 1413   TRIG 2,296 (HH) 01/28/2019 1413   HDL 14 (L) 01/28/2019 1413   CHOLHDL 4 11/21/2018 1102   VLDL 71.8 (H) 11/21/2018 1102   LDLCALC Comment 01/28/2019 1413   LDLDIRECT 63.0 11/21/2018 1102   Hepatic Function Panel     Component Value Date/Time   PROT 6.9 01/28/2019 1413   ALBUMIN 4.4 01/28/2019 1413   AST 49 (H) 01/28/2019 1413   ALT 43 01/28/2019 1413   ALKPHOS 80 01/28/2019 1413   BILITOT 0.3 01/28/2019 1413   BILIDIR 0.1 09/04/2008 0953      Component Value Date/Time   TSH 3.370 01/28/2019 1413   TSH 1.78 01/15/2007 0829      I, Sharon Martin, am acting as transcriptionist for  Kadolph, MD  I have reviewed the above documentation for accuracy and completeness, and I agree with the above. -  Kadolph, MD   

## 2019-04-18 ENCOUNTER — Other Ambulatory Visit: Payer: Self-pay

## 2019-04-18 ENCOUNTER — Ambulatory Visit (INDEPENDENT_AMBULATORY_CARE_PROVIDER_SITE_OTHER): Payer: PPO | Admitting: Family Medicine

## 2019-04-18 ENCOUNTER — Encounter (INDEPENDENT_AMBULATORY_CARE_PROVIDER_SITE_OTHER): Payer: Self-pay | Admitting: Family Medicine

## 2019-04-18 DIAGNOSIS — E1165 Type 2 diabetes mellitus with hyperglycemia: Secondary | ICD-10-CM

## 2019-04-18 DIAGNOSIS — Z794 Long term (current) use of insulin: Secondary | ICD-10-CM

## 2019-04-18 DIAGNOSIS — E669 Obesity, unspecified: Secondary | ICD-10-CM

## 2019-04-18 DIAGNOSIS — I10 Essential (primary) hypertension: Secondary | ICD-10-CM

## 2019-04-18 DIAGNOSIS — Z6830 Body mass index (BMI) 30.0-30.9, adult: Secondary | ICD-10-CM | POA: Diagnosis not present

## 2019-04-23 NOTE — Progress Notes (Signed)
Office: [phone]  /  Fax: [phone] TeleHealth Visit:  AYVION KAVANAGH has verbally consented to this TeleHealth visit today. The patient is located at home, the provider is located at the [facility] office. The participants in this visit include the listed provider and patient. The visit was conducted today via face time.  HPI:   Chief Complaint: OBESITY Lorenzo is here to discuss his progress with his obesity treatment plan. He is on the Category 2 plan and is following his eating plan approximately 90 % of the time. He states he is riding stationary bike for 20 minutes 5 times per week. Kavaughn notes it has been easier to find food. He is doing eggs for breakfast, sandwich for lunch, and protein and vegetables for dinner. He is able to eat all of the food on the plan. He denies hunger.  We were unable to weigh the patient today for this TeleHealth visit. He feels as if he has maintained his weight since his last visit. He has lost 0 lbs since starting treatment with [name].  Diabetes II with Hyperglycemia Iktan has a diagnosis of diabetes type II. Chang states his BGs range between 100 and 200's. He is still using 40 units of Humalog TID. He denies hypoglycemia. He has been working on intensive lifestyle modifications including diet, exercise, and weight loss to help control his blood glucose levels.  Hypertension MICKEAL DAWS is a 68 y.o. male with hypertension. Jamarii's blood pressure was previously slightly elevated. He denies chest pain, chest pressure, or headaches. He is working on weight loss to help control his blood pressure with the goal of decreasing his risk of heart attack and stroke.   ASSESSMENT AND PLAN:  Type 2 diabetes mellitus with hyperglycemia, with long-term current use of insulin (HCC)  Essential hypertension  Class 1 obesity with serious comorbidity and body mass index (BMI) of 30.0 to 30.9 in adult, unspecified obesity type  PLAN:   Diabetes II with Hyperglycemia Kassius has been given extensive diabetes education by myself today including ideal fasting and post-prandial blood glucose readings, individual ideal Hgb A1c goals and hypoglycemia prevention. We discussed the importance of good blood sugar control to decrease the likelihood of diabetic complications such as nephropathy, neuropathy, limb loss, blindness, coronary artery disease, and death. We discussed the importance of intensive lifestyle modification including diet, exercise and weight loss as the first line treatment for diabetes. Vasiliy agrees to continue his current insulin dose, and he agrees to follow up with our clinic in 2 weeks.  Hypertension We discussed sodium restriction, working on healthy weight loss, and a regular exercise program as the means to achieve improved blood pressure control. [name] agreed with this plan and agreed to follow up as directed. We will continue to monitor his blood pressure as well as his progress with the above lifestyle modifications. Medard agrees to continue his current medications and will watch for signs of hypotension as he continues his lifestyle modifications. Miki agrees to follow up with our clinic in 2 weeks.  Obesity Cruze is currently in the action stage of change. As such, his goal is to continue with weight loss efforts He has agreed to follow the Category 2 plan Thurmon has been instructed to work up to a goal of 150 minutes of combined cardio and strengthening exercise per week for weight loss and overall health benefits. We discussed the following Behavioral Modification Strategies today: increasing lean protein intake, increasing vegetables, work on meal planning  and easy cooking plans and emotional eating strategies, keeping healthy foods in the home, better snacking choices, and planning for success   Keats has agreed to follow up with our clinic in 2 weeks. He was informed of the importance of frequent  follow up visits to maximize his success with intensive lifestyle modifications for his multiple health conditions.  ALLERGIES: Allergies  Allergen Reactions  . Metformin And Related Diarrhea  . Sulfa Antibiotics Other (See Comments)    "make my feet burn"    MEDICATIONS: Current Outpatient Medications on File Prior to Visit  Medication Sig Dispense Refill  . acetaminophen (TYLENOL) 325 MG tablet Take 650 mg by mouth every 6 (six) hours as needed for moderate pain.    [name] allopurinol (ZYLOPRIM) 100 MG tablet Take 3 tablets (300 mg total) by mouth daily. 270 tablet 0  . amLODipine (NORVASC) 5 MG tablet Take 1 tablet (5 mg total) by mouth daily. 90 tablet 3  . amoxicillin-clavulanate (AUGMENTIN) 875-125 MG tablet Take 1 tablet by mouth 2 (two) times daily. 14 tablet 0  . atorvastatin (LIPITOR) 20 MG tablet Take 1 tablet (20 mg total) by mouth every evening. 90 tablet 3  . [name] Take 5,000 mg by mouth 2 (two) times daily.     . clonazePAM (KLONOPIN) 0.5 MG tablet TAKE ONE TABLET BY MOUTH TWICE A DAY 180 tablet 0  . colchicine 0.6 MG tablet Take 1 tablet (0.6 mg total) by mouth daily. Take BID if experiencing a flare. 60 tablet 1  . Continuous Blood Gluc Sensor ([name] 14 DAY SENSOR) MISC Apply 1 kit topically every 14 (fourteen) days. 6 each 3  . diclofenac (VOLTAREN) 75 MG EC tablet Take 1 tablet (75 mg total) by mouth 2 (two) times daily. 30 tablet 0  . furosemide (LASIX) 80 MG tablet Take 1 tablet (80 mg total) by mouth 2 (two) times daily. 180 tablet 3  . glucose blood ([name] TEST STRIPS) test strip Check blood sugars 2-3 times per day. DX: E11.9 300 each 2  . glucose monitoring kit ([name]) monitoring kit 1 each by Does not apply route as needed for other. DX Code: E11.9 1 each 0  . insulin lispro (HUMALOG) 100 UNIT/ML injection Inject 40 Units into the skin 3 (three) times daily before meals.    [name] ipratropium (ATROVENT) 0.06 % nasal spray Place 2 sprays into both  nostrils 4 (four) times daily. 15 mL 0  . metoprolol tartrate (LOPRESSOR) 100 MG tablet Take 1 tablet (100 mg total) by mouth 2 (two) times daily. 180 tablet 3  . NON FORMULARY Rehmannia 6 1336m TID    . traZODone (DESYREL) 50 MG tablet Take 3 tablets (150 mg total) by mouth at bedtime as needed for sleep. 180 tablet 3   No current facility-administered medications on file prior to visit.     PAST MEDICAL HISTORY: Past Medical History:  Diagnosis Date  . Anxiety   . Bilateral renal cysts   . Foley catheter in place   . Gout    02-22-2017 acute right foot gout---  per pt resolved  . Gout   . Gross hematuria   . Heart murmur   . History of BPH 08/14/2017  . Hydronephrosis, left   . Hyperlipidemia   . Hypertension   . Renal insufficiency   . [place]   . Swelling   . [place] 2 diabetes [place]   . Urinary retention   . Wears glasses  PAST SURGICAL HISTORY: Past Surgical History:  Procedure Laterality Date  . APPENDECTOMY  age 18  . CYSTOSCOPY WITH URETEROSCOPY AND STENT PLACEMENT Left 05/08/2017   Procedure: URETEROSCOPY AND STENT PLACEMENT;  Surgeon: Kathie Rhodes, MD;  Location: [facility];  Service: Urology;  Laterality: Left;  . CYSTOSCOPY/RETROGRADE/URETEROSCOPY Bilateral 05/08/2017   Procedure: CYSTOSCOPY/ BILATERAL RETROGRADE;  Surgeon: Kathie Rhodes, MD;  Location: [facility];  Service: Urology;  Laterality: Bilateral;  . INGUINAL HERNIA REPAIR Right 10/10/2000  . KNEE ARTHROSCOPY Right 1980's  . NASAL FRACTURE SURGERY     in highschool   . SHOULDER ARTHROSCOPY WITH OPEN ROTATOR CUFF REPAIR Right 1990's  . TRANSURETHRAL RESECTION OF PROSTATE      SOCIAL HISTORY: Social History   Tobacco Use  . Smoking status: Never Smoker  . Smokeless tobacco: Never Used  Substance Use Topics  . Alcohol use: No  . Drug use: No    FAMILY HISTORY: Family History  Problem Relation Age of Onset  . Congestive Heart  Failure Mother        Related to valve disease.  Opted to treat medically  . Hypertension Mother   . Hyperlipidemia Mother   . Diabetes Mother   . Hypertension Father   . Hyperlipidemia Father   . Coronary artery disease Father 46       History of CABG  . Stroke Father   . Hypertension Brother   . Diabetes Brother   . Hypertension Daughter   . Hypertension Son   . Hypertension Brother   . Hypertension Brother     ROS: Review of Systems  Constitutional: Negative for weight loss.  Cardiovascular: Negative for chest pain.       Negative chest pressure  Neurological: Negative for headaches.  Endo/Heme/Allergies:       Negative hypoglycemia    PHYSICAL EXAM: Pt in no acute distress  RECENT LABS AND TESTS: BMET    Component Value Date/Time   NA 144 01/28/2019 1413   K 4.1 01/28/2019 1413   CL 104 01/28/2019 1413   CO2 19 (L) 01/28/2019 1413   GLUCOSE 137 (H) 01/28/2019 1413   GLUCOSE 107 (H) 12/17/2018 1004   BUN 41 (H) 01/28/2019 1413   CREATININE 1.29 (H) 01/28/2019 1413   CREATININE 1.45 (H) 12/17/2018 1004   CALCIUM 9.6 01/28/2019 1413   GFRNONAA 57 (L) 01/28/2019 1413   GFRNONAA 50 (L) 12/17/2018 1004   GFRAA 66 01/28/2019 1413   GFRAA 58 (L) 12/17/2018 1004   Lab Results  Component Value Date   HGBA1C 6.7 (H) 08/14/2017   HGBA1C 8.3 (H) 01/15/2007   No results found for: INSULIN CBC    Component Value Date/Time   WBC 6.0 01/28/2019 1413   WBC 5.8 12/17/2018 1004   RBC 4.85 01/28/2019 1413   RBC 4.81 12/17/2018 1004   HGB 15.8 01/28/2019 1413   HCT 43.0 01/28/2019 1413   PLT 256 12/17/2018 1004   MCV 89 01/28/2019 1413   MCH 32.6 01/28/2019 1413   MCH 30.1 12/17/2018 1004   MCHC 36.7 (H) 01/28/2019 1413   MCHC 34.5 12/17/2018 1004   RDW 16.8 (H) 01/28/2019 1413   LYMPHSABS 1.9 01/28/2019 1413   MONOABS 0.8 11/03/2018 1407   EOSABS 0.1 01/28/2019 1413   BASOSABS 0.0 01/28/2019 1413   Iron/TIBC/Ferritin/ %Sat No results found for: IRON, TIBC,  FERRITIN, IRONPCTSAT Lipid Panel     Component Value Date/Time   CHOL 278 (H) 01/28/2019 1413   TRIG 2,296 (  HH) 01/28/2019 1413   HDL 14 (L) 01/28/2019 1413   CHOLHDL 4 11/21/2018 1102   VLDL 71.8 (H) 11/21/2018 1102   LDLCALC Comment 01/28/2019 1413   LDLDIRECT 63.0 11/21/2018 1102   Hepatic Function Panel     Component Value Date/Time   PROT 6.9 01/28/2019 1413   ALBUMIN 4.4 01/28/2019 1413   AST 49 (H) 01/28/2019 1413   ALT 43 01/28/2019 1413   ALKPHOS 80 01/28/2019 1413   BILITOT 0.3 01/28/2019 1413   BILIDIR 0.1 09/04/2008 0953      Component Value Date/Time   TSH 3.370 01/28/2019 1413   TSH 1.78 01/15/2007 0829      I, Trixie Dredge, am acting as transcriptionist for Ilene Qua, MD  I have reviewed the above documentation for accuracy and completeness, and I agree with the above. - Ilene Qua, MD

## 2019-05-02 ENCOUNTER — Encounter (INDEPENDENT_AMBULATORY_CARE_PROVIDER_SITE_OTHER): Payer: Self-pay | Admitting: Family Medicine

## 2019-05-02 ENCOUNTER — Other Ambulatory Visit: Payer: Self-pay

## 2019-05-02 ENCOUNTER — Ambulatory Visit (INDEPENDENT_AMBULATORY_CARE_PROVIDER_SITE_OTHER): Payer: PPO | Admitting: Family Medicine

## 2019-05-02 DIAGNOSIS — I10 Essential (primary) hypertension: Secondary | ICD-10-CM | POA: Diagnosis not present

## 2019-05-02 DIAGNOSIS — E669 Obesity, unspecified: Secondary | ICD-10-CM

## 2019-05-02 DIAGNOSIS — Z794 Long term (current) use of insulin: Secondary | ICD-10-CM

## 2019-05-02 DIAGNOSIS — E1165 Type 2 diabetes mellitus with hyperglycemia: Secondary | ICD-10-CM

## 2019-05-02 DIAGNOSIS — Z6831 Body mass index (BMI) 31.0-31.9, adult: Secondary | ICD-10-CM

## 2019-05-02 NOTE — Progress Notes (Signed)
 Office: [phone]  /  Fax: [phone] TeleHealth Visit:  Cannen W Waggoner has verbally consented to this TeleHealth visit today. The patient is located at home, the provider is located at the [facility] office. The participants in this visit include the listed provider and patient. The visit was conducted today via face time.  HPI:   Chief Complaint: OBESITY Gorje is here to discuss his progress with his obesity treatment plan. He is on the Category 2 plan and is following his eating plan approximately 90 % of the time. He states he is riding the stationary bike and walking for 20 minutes 4 times per week. Yorel voices some trouble finding some food, particularly apples and 45 calorie bread (doing 70 calorie wheat bread). He has no problems finding meat. He denies hunger. His most recent weight is of 187 lbs.  We were unable to weigh the patient today for this TeleHealth visit. He feels as if he has maintained his weight since his last visit. He has lost 0 lbs since starting treatment with us.  Diabetes II with Hyperglycemia Tamotsu has a diagnosis of diabetes type II. Lionell states fasting BGs range between 100 and 200 (180-190 average). He denies hypoglycemia. He is using Humalog 50 units BID with meal. He has been working on intensive lifestyle modifications including diet, exercise, and weight loss to help control his blood glucose levels.  Hypertension Keyston W Asbury is a 68 y.o. male with hypertension. Mckenna's blood pressure at home is 130/86. He denies chest pain, chest pressure, or headaches. He is working on weight loss to help control his blood pressure with the goal of decreasing his risk of heart attack and stroke.   ASSESSMENT AND PLAN:  Type 2 diabetes mellitus with hyperglycemia, with long-term current use of insulin (HCC)  Essential hypertension  Class 1 obesity with serious comorbidity and body mass index (BMI) of 31.0 to 31.9 in adult, unspecified  obesity type  PLAN:  Diabetes II with Hyperglycemia Joson has been given extensive diabetes education by myself today including ideal fasting and post-prandial blood glucose readings, individual ideal Hgb A1c goals and hypoglycemia prevention. We discussed the importance of good blood sugar control to decrease the likelihood of diabetic complications such as nephropathy, neuropathy, limb loss, blindness, coronary artery disease, and death. We discussed the importance of intensive lifestyle modification including diet, exercise and weight loss as the first line treatment for diabetes. Melton agrees to continue his current dosage of Humalog, and he agrees to follow up with our clinic in 2 weeks.  Hypertension We discussed sodium restriction, working on healthy weight loss, and a regular exercise program as the means to achieve improved blood pressure control. Longino agreed with this plan and agreed to follow up as directed. We will continue to monitor his blood pressure as well as his progress with the above lifestyle modifications. Nathanyl agrees to continue his current medication dosage and will watch for signs of hypotension as he continues his lifestyle modifications. Owenn agrees to follow up with our clinic in 2 weeks.  Obesity Jubal is currently in the action stage of change. As such, his goal is to continue with weight loss efforts He has agreed to follow the Category 2 plan Irish has been instructed to work up to a goal of 150 minutes of combined cardio and strengthening exercise per week or continue current level of physical activity for weight loss and overall health benefits. We discussed the following Behavioral Modification Strategies today:   increasing lean protein intake, increasing vegetables and work on meal planning and easy cooking plans, increase H20 intake, keeping healthy foods in the home, and planning for success   Joseff has agreed to follow up with our clinic in 2 weeks.  He was informed of the importance of frequent follow up visits to maximize his success with intensive lifestyle modifications for his multiple health conditions.  ALLERGIES: Allergies  Allergen Reactions  . Metformin And Related Diarrhea  . Sulfa Antibiotics Other (See Comments)    "make my feet burn"    MEDICATIONS: Current Outpatient Medications on File Prior to Visit  Medication Sig Dispense Refill  . acetaminophen (TYLENOL) 325 MG tablet Take 650 mg by mouth every 6 (six) hours as needed for moderate pain.    . allopurinol (ZYLOPRIM) 100 MG tablet Take 3 tablets (300 mg total) by mouth daily. 270 tablet 0  . amLODipine (NORVASC) 5 MG tablet Take 1 tablet (5 mg total) by mouth daily. 90 tablet 3  . amoxicillin-clavulanate (AUGMENTIN) 875-125 MG tablet Take 1 tablet by mouth 2 (two) times daily. 14 tablet 0  . atorvastatin (LIPITOR) 20 MG tablet Take 1 tablet (20 mg total) by mouth every evening. 90 tablet 3  . [name] Take 5,000 mg by mouth 2 (two) times daily.     . clonazePAM (KLONOPIN) 0.5 MG tablet TAKE ONE TABLET BY MOUTH TWICE A DAY 180 tablet 0  . colchicine 0.6 MG tablet Take 1 tablet (0.6 mg total) by mouth daily. Take BID if experiencing a flare. 60 tablet 1  . Continuous Blood Gluc Sensor ([name] 14 DAY SENSOR) MISC Apply 1 kit topically every 14 (fourteen) days. 6 each 3  . diclofenac (VOLTAREN) 75 MG EC tablet Take 1 tablet (75 mg total) by mouth 2 (two) times daily. 30 tablet 0  . furosemide (LASIX) 80 MG tablet Take 1 tablet (80 mg total) by mouth 2 (two) times daily. 180 tablet 3  . glucose blood ([name] TEST STRIPS) test strip Check blood sugars 2-3 times per day. DX: E11.9 300 each 2  . glucose monitoring kit ([name]) monitoring kit 1 each by Does not apply route as needed for other. DX Code: E11.9 1 each 0  . insulin lispro (HUMALOG) 100 UNIT/ML injection Inject 40 Units into the skin 3 (three) times daily before meals.    . ipratropium (ATROVENT)  0.06 % nasal spray Place 2 sprays into both nostrils 4 (four) times daily. 15 mL 0  . metoprolol tartrate (LOPRESSOR) 100 MG tablet Take 1 tablet (100 mg total) by mouth 2 (two) times daily. 180 tablet 3  . NON FORMULARY Rehmannia 6 1350mg TID    . traZODone (DESYREL) 50 MG tablet Take 3 tablets (150 mg total) by mouth at bedtime as needed for sleep. 180 tablet 3   No current facility-administered medications on file prior to visit.     PAST MEDICAL HISTORY: Past Medical History:  Diagnosis Date  . Anxiety   . Bilateral renal cysts   . Foley catheter in place   . Gout    02-22-2017 acute right foot gout---  per pt resolved  . Gout   . Gross hematuria   . Heart murmur   . History of BPH 08/14/2017  . Hydronephrosis, left   . Hyperlipidemia   . Hypertension   . Renal insufficiency   . Rheumatoid arthritis (HCC)   . Swelling   . Type 2 diabetes mellitus (HCC)   . Urinary retention   .   Wears glasses     PAST SURGICAL HISTORY: Past Surgical History:  Procedure Laterality Date  . APPENDECTOMY  age 26  . CYSTOSCOPY WITH URETEROSCOPY AND STENT PLACEMENT Left 05/08/2017   Procedure: URETEROSCOPY AND STENT PLACEMENT;  Surgeon: Ottelin, Mark, MD;  Location: [facility];  Service: Urology;  Laterality: Left;  . CYSTOSCOPY/RETROGRADE/URETEROSCOPY Bilateral 05/08/2017   Procedure: CYSTOSCOPY/ BILATERAL RETROGRADE;  Surgeon: Ottelin, Mark, MD;  Location: [facility];  Service: Urology;  Laterality: Bilateral;  . INGUINAL HERNIA REPAIR Right 10/10/2000  . KNEE ARTHROSCOPY Right 1980's  . NASAL FRACTURE SURGERY     in highschool   . SHOULDER ARTHROSCOPY WITH OPEN ROTATOR CUFF REPAIR Right 1990's  . TRANSURETHRAL RESECTION OF PROSTATE      SOCIAL HISTORY: Social History   Tobacco Use  . Smoking status: Never Smoker  . Smokeless tobacco: Never Used  Substance Use Topics  . Alcohol use: No  . Drug use: No    FAMILY HISTORY: Family History  Problem  Relation Age of Onset  . Congestive Heart Failure Mother        Related to valve disease.  Opted to treat medically  . Hypertension Mother   . Hyperlipidemia Mother   . Diabetes Mother   . Hypertension Father   . Hyperlipidemia Father   . Coronary artery disease Father 50       History of CABG  . Stroke Father   . Hypertension Brother   . Diabetes Brother   . Hypertension Daughter   . Hypertension Son   . Hypertension Brother   . Hypertension Brother     ROS: Review of Systems  Constitutional: Negative for weight loss.  Cardiovascular: Negative for chest pain.       Negative chest pressure  Neurological: Negative for headaches.  Endo/Heme/Allergies:       Negative hypoglycemia    PHYSICAL EXAM: Pt in no acute distress  RECENT LABS AND TESTS: BMET    Component Value Date/Time   NA 144 01/28/2019 1413   K 4.1 01/28/2019 1413   CL 104 01/28/2019 1413   CO2 19 (L) 01/28/2019 1413   GLUCOSE 137 (H) 01/28/2019 1413   GLUCOSE 107 (H) 12/17/2018 1004   BUN 41 (H) 01/28/2019 1413   CREATININE 1.29 (H) 01/28/2019 1413   CREATININE 1.45 (H) 12/17/2018 1004   CALCIUM 9.6 01/28/2019 1413   GFRNONAA 57 (L) 01/28/2019 1413   GFRNONAA 50 (L) 12/17/2018 1004   GFRAA 66 01/28/2019 1413   GFRAA 58 (L) 12/17/2018 1004   Lab Results  Component Value Date   HGBA1C 6.7 (H) 08/14/2017   HGBA1C 8.3 (H) 01/15/2007   No results found for: INSULIN CBC    Component Value Date/Time   WBC 6.0 01/28/2019 1413   WBC 5.8 12/17/2018 1004   RBC 4.85 01/28/2019 1413   RBC 4.81 12/17/2018 1004   HGB 15.8 01/28/2019 1413   HCT 43.0 01/28/2019 1413   PLT 256 12/17/2018 1004   MCV 89 01/28/2019 1413   MCH 32.6 01/28/2019 1413   MCH 30.1 12/17/2018 1004   MCHC 36.7 (H) 01/28/2019 1413   MCHC 34.5 12/17/2018 1004   RDW 16.8 (H) 01/28/2019 1413   LYMPHSABS 1.9 01/28/2019 1413   MONOABS 0.8 11/03/2018 1407   EOSABS 0.1 01/28/2019 1413   BASOSABS 0.0 01/28/2019 1413    Iron/TIBC/Ferritin/ %Sat No results found for: IRON, TIBC, FERRITIN, IRONPCTSAT Lipid Panel     Component Value Date/Time   CHOL 278 (H)   01/28/2019 1413   TRIG 2,296 (HH) 01/28/2019 1413   HDL 14 (L) 01/28/2019 1413   CHOLHDL 4 11/21/2018 1102   VLDL 71.8 (H) 11/21/2018 1102   LDLCALC Comment 01/28/2019 1413   LDLDIRECT 63.0 11/21/2018 1102   Hepatic Function Panel     Component Value Date/Time   PROT 6.9 01/28/2019 1413   ALBUMIN 4.4 01/28/2019 1413   AST 49 (H) 01/28/2019 1413   ALT 43 01/28/2019 1413   ALKPHOS 80 01/28/2019 1413   BILITOT 0.3 01/28/2019 1413   BILIDIR 0.1 09/04/2008 0953      Component Value Date/Time   TSH 3.370 01/28/2019 1413   TSH 1.78 01/15/2007 0829      I, Sharon Martin, am acting as transcriptionist for Alexandria Kadolph, MD  I have reviewed the above documentation for accuracy and completeness, and I agree with the above. - Alexandria Kadolph, MD   

## 2019-05-16 ENCOUNTER — Other Ambulatory Visit: Payer: Self-pay

## 2019-05-16 ENCOUNTER — Encounter (INDEPENDENT_AMBULATORY_CARE_PROVIDER_SITE_OTHER): Payer: Self-pay | Admitting: Family Medicine

## 2019-05-16 ENCOUNTER — Ambulatory Visit (INDEPENDENT_AMBULATORY_CARE_PROVIDER_SITE_OTHER): Payer: PPO | Admitting: Family Medicine

## 2019-05-16 DIAGNOSIS — M25511 Pain in right shoulder: Secondary | ICD-10-CM

## 2019-05-16 DIAGNOSIS — I10 Essential (primary) hypertension: Secondary | ICD-10-CM | POA: Diagnosis not present

## 2019-05-16 DIAGNOSIS — E669 Obesity, unspecified: Secondary | ICD-10-CM

## 2019-05-16 DIAGNOSIS — Z6830 Body mass index (BMI) 30.0-30.9, adult: Secondary | ICD-10-CM | POA: Diagnosis not present

## 2019-05-16 DIAGNOSIS — E1165 Type 2 diabetes mellitus with hyperglycemia: Secondary | ICD-10-CM

## 2019-05-16 DIAGNOSIS — G8929 Other chronic pain: Secondary | ICD-10-CM | POA: Diagnosis not present

## 2019-05-16 MED ORDER — DICLOFENAC SODIUM 50 MG PO TBEC: 50.0000 mg | DELAYED_RELEASE_TABLET | Freq: Two times a day (BID) | ORAL | 0 refills | Status: AC | PRN

## 2019-05-18 ENCOUNTER — Other Ambulatory Visit: Payer: Self-pay | Admitting: Rheumatology

## 2019-05-20 NOTE — Progress Notes (Signed)
Office: [phone]  /  Fax: [phone] TeleHealth Visit:  Jerry York has verbally consented to this TeleHealth visit today. The patient is located at home, the provider is located at the [employer] [facility] office. The participants in this visit include the listed provider and patient. The visit was conducted today via face time.  HPI:   Chief Complaint: OBESITY Jerry York is here to discuss his progress with his obesity treatment plan. He is on the Category 2 plan and is following his eating plan approximately 0 % of the time (unsure). He states he is exercising 0 minutes 0 times per week. Jerry York voices occasional unavailability of chicken. He is still getting in around 8 oz of chicken at dinner. He denies hunger consistently, but occasional hunger a couple of hours after eating. He is doing Dalance break and cheese sticks for snacks.  We were unable to weigh the patient today for this TeleHealth visit. He feels as if he has maintained his weight since his last visit. He has lost 0 lbs since starting treatment with [name].  Diabetes II with Hyperglycemia Jerry York has a diagnosis of diabetes type II. Jerry York states fasting BGs range between 100 and 260's, more consistently in 170's. The lowest BGs seen is 80. Last Hgb A1c was of 7.2 on 01/03/2019. He has been working on intensive lifestyle modifications including diet, exercise, and weight loss to help control his blood glucose levels.  Hypertension Jerry York is a 68 y.o. male with hypertension. [name]'s blood pressure is controlled at home at 120/80. He denies chest pain, chest pressure, or headaches. He is working on weight loss to help control his blood pressure with the goal of decreasing his risk of heart attack and stroke.   Bilateral Right Shoulder Pain Jerry York is still experiencing shoulder and back pain. He was previously prescribed diclofenac with improvement in pain control.  ASSESSMENT AND PLAN:  Type 2 diabetes  mellitus with hyperglycemia, without long-term current use of insulin (HCC)  Essential hypertension  Chronic right shoulder pain - Plan: diclofenac (VOLTAREN) 50 MG EC tablet  Class 1 obesity with serious comorbidity and body mass index (BMI) of 30.0 to 30.9 in adult, unspecified obesity type  PLAN:  Diabetes II with Hyperglycemia Jerry York has been given extensive diabetes education by myself today including ideal fasting and post-prandial blood glucose readings, individual ideal Hgb A1c goals and hypoglycemia prevention. We discussed the importance of good blood sugar control to decrease the likelihood of diabetic complications such as nephropathy, neuropathy, limb loss, blindness, coronary artery disease, and death. We discussed the importance of intensive lifestyle modification including diet, exercise and weight loss as the first line treatment for diabetes. Jerry York agrees to continue his current insulin dose of Humalog 63 units BID. Jerry York agrees to follow up with our clinic in 2 weeks.  Hypertension We discussed sodium restriction, working on healthy weight loss, and a regular exercise program as the means to achieve improved blood pressure control. Jerry York agreed with this plan and agreed to follow up as directed. We will continue to monitor his blood pressure as well as his progress with the above lifestyle modifications. Jerry York agrees to continue his current medications, no change in medications and will watch for signs of hypotension as he continues his lifestyle modifications. Jerry York agrees to follow up with our clinic in 2 weeks.  Bilateral Right Shoulder Pain Jerry York agrees to continue taking diclofenac 75 mg PO BID as needed for pain #30 and we will refill for  1 month. Jerry York agrees to follow up with our clinic in 2 weeks.  Obesity Jerry York is currently in the action stage of change. As such, his goal is to continue with weight loss efforts He has agreed to follow the Category 2 plan  Page has been instructed to work up to a goal of 150 minutes of combined cardio and strengthening exercise per week or start soing some activity for 10-15 minutes 3-4 times per week for weight loss and overall health benefits. We discussed the following Behavioral Modification Strategies today: increasing lean protein intake, increasing vegetables and work on meal planning and easy cooking plans, keeping healthy foods in the home, and planning for success   Jerry York has agreed to follow up with our clinic in 2 weeks. He was informed of the importance of frequent follow up visits to maximize his success with intensive lifestyle modifications for his multiple health conditions.  ALLERGIES: Allergies  Allergen Reactions  . Metformin And Related Diarrhea  . Sulfa Antibiotics Other (See Comments)    "make my feet burn"    MEDICATIONS: Current Outpatient Medications on File Prior to Visit  Medication Sig Dispense Refill  . acetaminophen (TYLENOL) 325 MG tablet Take 650 mg by mouth every 6 (six) hours as needed for moderate pain.    Jerry York allopurinol (ZYLOPRIM) 100 MG tablet Take 3 tablets (300 mg total) by mouth daily. 270 tablet 0  . amLODipine (NORVASC) 5 MG tablet Take 1 tablet (5 mg total) by mouth daily. 90 tablet 3  . amoxicillin-clavulanate (AUGMENTIN) 875-125 MG tablet Take 1 tablet by mouth 2 (two) times daily. 14 tablet 0  . atorvastatin (LIPITOR) 20 MG tablet Take 1 tablet (20 mg total) by mouth every evening. 90 tablet 3  . [name] Take 5,000 mg by mouth 2 (two) times daily.     . clonazePAM (KLONOPIN) 0.5 MG tablet TAKE ONE TABLET BY MOUTH TWICE A DAY 180 tablet 0  . colchicine 0.6 MG tablet Take 1 tablet (0.6 mg total) by mouth daily. Take BID if experiencing a flare. 60 tablet 1  . Continuous Blood Gluc Sensor ([name] 14 DAY SENSOR) MISC Apply 1 kit topically every 14 (fourteen) days. 6 each 3  . furosemide (LASIX) 80 MG tablet Take 1 tablet (80 mg total) by mouth 2 (two)  times daily. 180 tablet 3  . glucose blood ([name] TEST STRIPS) test strip Check blood sugars 2-3 times per day. DX: E11.9 300 each 2  . glucose monitoring kit ([name]) monitoring kit 1 each by Does not apply route as needed for other. DX Code: E11.9 1 each 0  . insulin lispro (HUMALOG) 100 UNIT/ML injection Inject 40 Units into the skin 3 (three) times daily before meals.    Jerry York ipratropium (ATROVENT) 0.06 % nasal spray Place 2 sprays into both nostrils 4 (four) times daily. 15 mL 0  . metoprolol tartrate (LOPRESSOR) 100 MG tablet Take 1 tablet (100 mg total) by mouth 2 (two) times daily. 180 tablet 3  . NON FORMULARY Rehmannia 6 1334m TID    . traZODone (DESYREL) 50 MG tablet Take 3 tablets (150 mg total) by mouth at bedtime as needed for sleep. 180 tablet 3   No current facility-administered medications on file prior to visit.     PAST MEDICAL HISTORY: Past Medical History:  Diagnosis Date  . Anxiety   . Bilateral renal cysts   . Foley catheter in place   . Gout    02-22-2017 acute right foot  gout---  per pt resolved  . Gout   . Gross hematuria   . Heart murmur   . History of BPH 08/14/2017  . Hydronephrosis, left   . Hyperlipidemia   . Hypertension   . Renal insufficiency   . Rheumatoid arthritis ([place])   . Swelling   . Type 2 diabetes mellitus ([place])   . Urinary retention   . Wears glasses     PAST SURGICAL HISTORY: Past Surgical History:  Procedure Laterality Date  . APPENDECTOMY  age 51  . CYSTOSCOPY WITH URETEROSCOPY AND STENT PLACEMENT Left 05/08/2017   Procedure: URETEROSCOPY AND STENT PLACEMENT;  Surgeon: Kathie Rhodes, MD;  Location: [facility];  Service: Urology;  Laterality: Left;  . CYSTOSCOPY/RETROGRADE/URETEROSCOPY Bilateral 05/08/2017   Procedure: CYSTOSCOPY/ BILATERAL RETROGRADE;  Surgeon: Kathie Rhodes, MD;  Location: [facility];  Service: Urology;  Laterality: Bilateral;  . INGUINAL HERNIA REPAIR Right 10/10/2000  . KNEE  ARTHROSCOPY Right 1980's  . NASAL FRACTURE SURGERY     in highschool   . SHOULDER ARTHROSCOPY WITH OPEN ROTATOR CUFF REPAIR Right 1990's  . TRANSURETHRAL RESECTION OF PROSTATE      SOCIAL HISTORY: Social History   Tobacco Use  . Smoking status: Never Smoker  . Smokeless tobacco: Never Used  Substance Use Topics  . Alcohol use: No  . Drug use: No    FAMILY HISTORY: Family History  Problem Relation Age of Onset  . Congestive Heart Failure Mother        Related to valve disease.  Opted to treat medically  . Hypertension Mother   . Hyperlipidemia Mother   . Diabetes Mother   . Hypertension Father   . Hyperlipidemia Father   . Coronary artery disease Father 84       History of CABG  . Stroke Father   . Hypertension Brother   . Diabetes Brother   . Hypertension Daughter   . Hypertension Son   . Hypertension Brother   . Hypertension Brother     ROS: Review of Systems  Constitutional: Negative for weight loss.  Cardiovascular: Negative for chest pain.       Negative chest pressure  Musculoskeletal: Positive for back pain.       + Right shoulder pain  Neurological: Negative for headaches.  Endo/Heme/Allergies:       Negative hypoglycemia    PHYSICAL EXAM: Pt in no acute distress  RECENT LABS AND TESTS: BMET    Component Value Date/Time   NA 144 01/28/2019 1413   K 4.1 01/28/2019 1413   CL 104 01/28/2019 1413   CO2 19 (L) 01/28/2019 1413   GLUCOSE 137 (H) 01/28/2019 1413   GLUCOSE 107 (H) 12/17/2018 1004   BUN 41 (H) 01/28/2019 1413   CREATININE 1.29 (H) 01/28/2019 1413   CREATININE 1.45 (H) 12/17/2018 1004   CALCIUM 9.6 01/28/2019 1413   GFRNONAA 57 (L) 01/28/2019 1413   GFRNONAA 50 (L) 12/17/2018 1004   GFRAA 66 01/28/2019 1413   GFRAA 58 (L) 12/17/2018 1004   Lab Results  Component Value Date   HGBA1C 6.7 (H) 08/14/2017   HGBA1C 8.3 (H) 01/15/2007   No results found for: INSULIN CBC    Component Value Date/Time   WBC 6.0 01/28/2019 1413    WBC 5.8 12/17/2018 1004   RBC 4.85 01/28/2019 1413   RBC 4.81 12/17/2018 1004   HGB 15.8 01/28/2019 1413   HCT 43.0 01/28/2019 1413   PLT 256 12/17/2018 1004   MCV 89  01/28/2019 1413   MCH 32.6 01/28/2019 1413   MCH 30.1 12/17/2018 1004   MCHC 36.7 (H) 01/28/2019 1413   MCHC 34.5 12/17/2018 1004   RDW 16.8 (H) 01/28/2019 1413   LYMPHSABS 1.9 01/28/2019 1413   MONOABS 0.8 11/03/2018 1407   EOSABS 0.1 01/28/2019 1413   BASOSABS 0.0 01/28/2019 1413   Iron/TIBC/Ferritin/ %Sat No results found for: IRON, TIBC, FERRITIN, IRONPCTSAT Lipid Panel     Component Value Date/Time   CHOL 278 (H) 01/28/2019 1413   TRIG 2,296 (HH) 01/28/2019 1413   HDL 14 (L) 01/28/2019 1413   CHOLHDL 4 11/21/2018 1102   VLDL 71.8 (H) 11/21/2018 1102   LDLCALC Comment 01/28/2019 1413   LDLDIRECT 63.0 11/21/2018 1102   Hepatic Function Panel     Component Value Date/Time   PROT 6.9 01/28/2019 1413   ALBUMIN 4.4 01/28/2019 1413   AST 49 (H) 01/28/2019 1413   ALT 43 01/28/2019 1413   ALKPHOS 80 01/28/2019 1413   BILITOT 0.3 01/28/2019 1413   BILIDIR 0.1 09/04/2008 0953      Component Value Date/Time   TSH 3.370 01/28/2019 1413   TSH 1.78 01/15/2007 0829      I, Trixie Dredge, am acting as transcriptionist for Renee Rival, MD  I have reviewed the above documentation for accuracy and completeness, and I agree with the above. - Ilene Qua, MD

## 2019-05-21 NOTE — Telephone Encounter (Signed)
Last visit: 12/17/18 Next visit: 06/17/19 Labs: 01/28/19 Creat 1.29 GFR 57 BUN 41 Glucose 137 AST 49  Okay to refill per Dr. Corliss Skains

## 2019-05-22 ENCOUNTER — Other Ambulatory Visit: Payer: Self-pay | Admitting: Family Medicine

## 2019-05-30 ENCOUNTER — Encounter (INDEPENDENT_AMBULATORY_CARE_PROVIDER_SITE_OTHER): Payer: Self-pay | Admitting: Family Medicine

## 2019-05-30 ENCOUNTER — Ambulatory Visit (INDEPENDENT_AMBULATORY_CARE_PROVIDER_SITE_OTHER): Payer: PPO | Admitting: Family Medicine

## 2019-05-30 ENCOUNTER — Other Ambulatory Visit: Payer: Self-pay

## 2019-05-30 DIAGNOSIS — Z6830 Body mass index (BMI) 30.0-30.9, adult: Secondary | ICD-10-CM

## 2019-05-30 DIAGNOSIS — E7849 Other hyperlipidemia: Secondary | ICD-10-CM | POA: Diagnosis not present

## 2019-05-30 DIAGNOSIS — E1165 Type 2 diabetes mellitus with hyperglycemia: Secondary | ICD-10-CM | POA: Diagnosis not present

## 2019-05-30 DIAGNOSIS — Z794 Long term (current) use of insulin: Secondary | ICD-10-CM | POA: Diagnosis not present

## 2019-05-30 DIAGNOSIS — E669 Obesity, unspecified: Secondary | ICD-10-CM | POA: Diagnosis not present

## 2019-05-30 NOTE — Progress Notes (Signed)
Office: [phone]  /  Fax: [phone] TeleHealth Visit:  Jerry York has verbally consented to this TeleHealth visit today. The patient is located at home, the provider is located at the [employer] [facility] office. The participants in this visit include the listed provider and patient. The visit was conducted today via face time.  HPI:   Chief Complaint: OBESITY Jerry York is here to discuss his progress with his obesity treatment plan. He is on the Category 2 plan and is following his eating plan approximately 0 % of the time (unsure). He states he is walking for 5-10 minutes 3-4 times per week. Jerry York's weight is of 190 lbs today. He is able to find all of the food. He denies any obstacles with following the plan. He is frustrated with lack of weight loss and voices that he is eating all of the food. He does have pain in posterior leg even at rest occasionally. He is planning for a beach trip July 4th-10th. He states his blood pressure is 130/80. We were unable to weigh the patient today for this TeleHealth visit. He feels as if he has maintained his weight since his last visit. He has lost 0 lbs since starting treatment with [name].  Diabetes II with Hyperglycemia Jerry York has a diagnosis of diabetes type II. Jerry York states his fasting BGs were 159 this morning. He denies hypoglycemia. He has been working on intensive lifestyle modifications including diet, exercise, and weight loss to help control his blood glucose levels.  Hyperlipidemia Jerry York has hyperlipidemia and has been trying to improve his cholesterol levels with intensive lifestyle modification including a low saturated fat diet, exercise and weight loss. He is on statin and denies any chest pain, claudication or myalgias.  ASSESSMENT AND PLAN:  Type 2 diabetes mellitus with hyperglycemia, with long-term current use of insulin (HCC)  Other hyperlipidemia  Class 1 obesity with serious comorbidity and body mass index  (BMI) of 30.0 to 30.9 in adult, unspecified obesity type  PLAN:  Diabetes II with Hyperglycemia Jerry York has been given extensive diabetes education by myself today including ideal fasting and post-prandial blood glucose readings, individual ideal Hgb A1c goals and hypoglycemia prevention. We discussed the importance of good blood sugar control to decrease the likelihood of diabetic complications such as nephropathy, neuropathy, limb loss, blindness, coronary artery disease, and death. We discussed the importance of intensive lifestyle modification including diet, exercise and weight loss as the first line treatment for diabetes. Jerry York agrees to continue his insulin at current dose, and he agrees to follow up with our clinic in 2 weeks.  Hyperlipidemia Jerry York was informed of the American Heart Association Guidelines emphasizing intensive lifestyle modifications as the first line treatment for hyperlipidemia. We discussed many lifestyle modifications today in depth, and Jerry York will continue to work on decreasing saturated fats such as fatty red meat, butter and many fried foods. He will also increase vegetables and lean protein in his diet and continue to work on exercise and weight loss efforts. We will repeat FLP at his first in person appointment. Jerry York agrees to follow up with our clinic in 2 weeks.  Obesity Jerry York is currently in the action stage of change. As such, his goal is to continue with weight loss efforts He has agreed to follow the Category 3 plan Jerry York has been instructed to work up to a goal of 150 minutes of combined cardio and strengthening exercise per week for weight loss and overall health benefits. We discussed the following  Behavioral Modification Strategies today: increasing lean protein intake, increasing vegetables and work on meal planning and easy cooking plans, keeping healthy foods in the home, better snacking choices, and planning for success   Jerry York has agreed to  follow up with our clinic in 2 weeks. He was informed of the importance of frequent follow up visits to maximize his success with intensive lifestyle modifications for his multiple health conditions.  ALLERGIES: Allergies  Allergen Reactions  . Metformin And Related Diarrhea  . Sulfa Antibiotics Other (See Comments)    "make my feet burn"    MEDICATIONS: Current Outpatient Medications on File Prior to Visit  Medication Sig Dispense Refill  . acetaminophen (TYLENOL) 325 MG tablet Take 650 mg by mouth every 6 (six) hours as needed for moderate pain.    [name] allopurinol (ZYLOPRIM) 300 MG tablet TAKE ONE TABLET BY MOUTH DAILY 90 tablet 0  . amLODipine (NORVASC) 5 MG tablet Take 1 tablet (5 mg total) by mouth daily. 90 tablet 3  . atorvastatin (LIPITOR) 20 MG tablet TAKE ONE TABLET BY MOUTH EVERY EVENING 90 tablet 0  . [name] Take 5,000 mg by mouth 2 (two) times daily.     . clonazePAM (KLONOPIN) 0.5 MG tablet TAKE ONE TABLET BY MOUTH TWICE A DAY 180 tablet 0  . colchicine 0.6 MG tablet Take 1 tablet (0.6 mg total) by mouth daily. Take BID if experiencing a flare. 60 tablet 1  . Continuous Blood Gluc Sensor ([name] 14 DAY SENSOR) MISC Apply 1 kit topically every 14 (fourteen) days. 6 each 3  . diclofenac (VOLTAREN) 50 MG EC tablet Take 1 tablet (50 mg total) by mouth 2 (two) times daily as needed for up to 30 days for moderate pain. 60 tablet 0  . furosemide (LASIX) 80 MG tablet Take 1 tablet (80 mg total) by mouth 2 (two) times daily. 180 tablet 3  . glucose blood ([name] TEST STRIPS) test strip Check blood sugars 2-3 times per day. DX: E11.9 300 each 2  . glucose monitoring kit ([name]) monitoring kit 1 each by Does not apply route as needed for other. DX Code: E11.9 1 each 0  . insulin lispro (HUMALOG) 100 UNIT/ML injection Inject 40 Units into the skin 3 (three) times daily before meals.    . metoprolol tartrate (LOPRESSOR) 100 MG tablet Take 1 tablet (100 mg total) by mouth  2 (two) times daily. 180 tablet 3  . NON FORMULARY Rehmannia 6 1348m TID    . traZODone (DESYREL) 50 MG tablet Take 3 tablets (150 mg total) by mouth at bedtime as needed for sleep. 180 tablet 3   No current facility-administered medications on file prior to visit.     PAST MEDICAL HISTORY: Past Medical History:  Diagnosis Date  . Anxiety   . Bilateral renal cysts   . Foley catheter in place   . Gout    02-22-2017 acute right foot gout---  per pt resolved  . Gout   . Gross hematuria   . Heart murmur   . History of BPH 08/14/2017  . Hydronephrosis, left   . Hyperlipidemia   . Hypertension   . Renal insufficiency   . [place]   . Sw[place]   . [place]   . Urinary retention   . Wears glasses     PAST SURGICAL HISTORY: Past Surgical History:  Procedure Laterality Date  . APPENDECTOMY  age 68 . CYSTOSCOPY WITH URETEROSCOPY AND STENT PLACEMENT  Left 05/08/2017   Procedure: URETEROSCOPY AND STENT PLACEMENT;  Surgeon: Kathie Rhodes, MD;  Location: [facility];  Service: Urology;  Laterality: Left;  . CYSTOSCOPY/RETROGRADE/URETEROSCOPY Bilateral 05/08/2017   Procedure: CYSTOSCOPY/ BILATERAL RETROGRADE;  Surgeon: Kathie Rhodes, MD;  Location: [facility];  Service: Urology;  Laterality: Bilateral;  . INGUINAL HERNIA REPAIR Right 10/10/2000  . KNEE ARTHROSCOPY Right 1980's  . NASAL FRACTURE SURGERY     in highschool   . SHOULDER ARTHROSCOPY WITH OPEN ROTATOR CUFF REPAIR Right 1990's  . TRANSURETHRAL RESECTION OF PROSTATE      SOCIAL HISTORY: Social History   Tobacco Use  . Smoking status: Never Smoker  . Smokeless tobacco: Never Used  Substance Use Topics  . Alcohol use: No  . Drug use: No    FAMILY HISTORY: Family History  Problem Relation Age of Onset  . Congestive Heart Failure Mother        Related to valve disease.  Opted to treat medically  . Hypertension Mother   . Hyperlipidemia Mother   .  Diabetes Mother   . Hypertension Father   . Hyperlipidemia Father   . Coronary artery disease Father 33       History of CABG  . Stroke Father   . Hypertension Brother   . Diabetes Brother   . Hypertension Daughter   . Hypertension Son   . Hypertension Brother   . Hypertension Brother     ROS: Review of Systems  Constitutional: Negative for weight loss.  Cardiovascular: Negative for chest pain and claudication.       + Leg pain  Musculoskeletal: Negative for myalgias.  Endo/Heme/Allergies:       Negative hypoglycemia    PHYSICAL EXAM: Pt in no acute distress  RECENT LABS AND TESTS: BMET    Component Value Date/Time   NA 144 01/28/2019 1413   K 4.1 01/28/2019 1413   CL 104 01/28/2019 1413   CO2 19 (L) 01/28/2019 1413   GLUCOSE 137 (H) 01/28/2019 1413   GLUCOSE 107 (H) 12/17/2018 1004   BUN 41 (H) 01/28/2019 1413   CREATININE 1.29 (H) 01/28/2019 1413   CREATININE 1.45 (H) 12/17/2018 1004   CALCIUM 9.6 01/28/2019 1413   GFRNONAA 57 (L) 01/28/2019 1413   GFRNONAA 50 (L) 12/17/2018 1004   GFRAA 66 01/28/2019 1413   GFRAA 58 (L) 12/17/2018 1004   Lab Results  Component Value Date   HGBA1C 6.7 (H) 08/14/2017   HGBA1C 8.3 (H) 01/15/2007   No results found for: INSULIN CBC    Component Value Date/Time   WBC 6.0 01/28/2019 1413   WBC 5.8 12/17/2018 1004   RBC 4.85 01/28/2019 1413   RBC 4.81 12/17/2018 1004   HGB 15.8 01/28/2019 1413   HCT 43.0 01/28/2019 1413   PLT 256 12/17/2018 1004   MCV 89 01/28/2019 1413   MCH 32.6 01/28/2019 1413   MCH 30.1 12/17/2018 1004   MCHC 36.7 (H) 01/28/2019 1413   MCHC 34.5 12/17/2018 1004   RDW 16.8 (H) 01/28/2019 1413   LYMPHSABS 1.9 01/28/2019 1413   MONOABS 0.8 11/03/2018 1407   EOSABS 0.1 01/28/2019 1413   BASOSABS 0.0 01/28/2019 1413   Iron/TIBC/Ferritin/ %Sat No results found for: IRON, TIBC, FERRITIN, IRONPCTSAT Lipid Panel     Component Value Date/Time   CHOL 278 (H) 01/28/2019 1413   TRIG 2,296 (HH)  01/28/2019 1413   HDL 14 (L) 01/28/2019 1413   CHOLHDL 4 11/21/2018 1102   VLDL 71.8 (H) 11/21/2018  1102   [place] 01/28/2019 1413   LDLDIRECT 63.0 11/21/2018 1102   Hepatic Function Panel     Component Value Date/Time   PROT 6.9 01/28/2019 1413   ALBUMIN 4.4 01/28/2019 1413   AST 49 (H) 01/28/2019 1413   ALT 43 01/28/2019 1413   ALKPHOS 80 01/28/2019 1413   BILITOT 0.3 01/28/2019 1413   BILIDIR 0.1 09/04/2008 0953      Component Value Date/Time   TSH 3.370 01/28/2019 1413   TSH 1.78 01/15/2007 0829      I, Trixie Dredge, am acting as transcriptionist for Ilene Qua, MD  I have reviewed the above documentation for accuracy and completeness, and I agree with the above. - Ilene Qua, MD

## 2019-05-31 ENCOUNTER — Other Ambulatory Visit: Payer: Self-pay

## 2019-05-31 ENCOUNTER — Encounter: Payer: Self-pay | Admitting: Family Medicine

## 2019-06-02 ENCOUNTER — Encounter: Payer: Self-pay | Admitting: Family Medicine

## 2019-06-03 MED ORDER — AMLODIPINE BESYLATE 5 MG PO TABS: 5.0000 mg | ORAL_TABLET | Freq: Every day | ORAL | 1 refills | Status: DC

## 2019-06-03 MED ORDER — CLONAZEPAM 0.5 MG PO TABS: 0.5000 mg | ORAL_TABLET | Freq: Two times a day (BID) | ORAL | 0 refills | Status: DC

## 2019-06-03 NOTE — Progress Notes (Deleted)
Office Visit Note  Patient: Jerry York             Date of Birth: 10-07-1951           MRN: [id number]             PCP: [name], MD Referring: [name], MD Visit Date: 06/17/2019 Occupation: @GUAROCC @  Subjective:  No chief complaint on file.   History of Present Illness: Jerry York is a 68 y.o. male ***   Activities of Daily Living:  Patient reports morning stiffness for *** {minute/hour:19697}.   Patient {ACTIONS;DENIES/REPORTS:21021675::"Denies"} nocturnal pain.  Difficulty dressing/grooming: {ACTIONS;DENIES/REPORTS:21021675::"Denies"} Difficulty climbing stairs: {ACTIONS;DENIES/REPORTS:21021675::"Denies"} Difficulty getting out of chair: {ACTIONS;DENIES/REPORTS:21021675::"Denies"} Difficulty using hands for taps, buttons, cutlery, and/or writing: {ACTIONS;DENIES/REPORTS:21021675::"Denies"}  No Rheumatology ROS completed.   PMFS History:  Patient Active Problem List   Diagnosis Date Noted  . Lower extremity edema 12/11/2017  . Family history of premature CAD 11/27/2017  . 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 Type 2 diabetes mellitus ([place]) 08/09/2017  . Gout 08/09/2017  . Anxiety 08/09/2017  . Hyperlipidemia associated with type 2 diabetes mellitus ([place]) 08/08/2017  . Hypertension associated with diabetes ([place]) 08/08/2017    Past Medical History:  Diagnosis Date  . Anxiety   . Bilateral renal cysts   . Foley catheter in place   . Gout    02-22-2017 acute right foot gout---  per pt resolved  . Gout   . Gross hematuria   . Heart murmur   . History of BPH 08/14/2017  . Hydronephrosis, left   . Hyperlipidemia   . Hypertension   . Renal insufficiency   . Rheumatoid arthritis ([place])   . Swelling   . Type 2 diabetes mellitus ([place])   . Urinary retention   . Wears glasses     Family History  Problem Relation Age of Onset  . Congestive Heart Failure Mother        Related to valve disease.  Opted to  treat medically  . Hypertension Mother   . Hyperlipidemia Mother   . Diabetes Mother   . Hypertension Father   . Hyperlipidemia Father   . Coronary artery disease Father 75       History of CABG  . Stroke Father   . Hypertension Brother   . Diabetes Brother   . Hypertension Daughter   . Hypertension Son   . Hypertension Brother   . Hypertension Brother    Past Surgical History:  Procedure Laterality Date  . APPENDECTOMY  age 52  . CYSTOSCOPY WITH URETEROSCOPY AND STENT PLACEMENT Left 05/08/2017   Procedure: URETEROSCOPY AND STENT PLACEMENT;  Surgeon: Kathie Rhodes, MD;  Location: [facility];  Service: Urology;  Laterality: Left;  . CYSTOSCOPY/RETROGRADE/URETEROSCOPY Bilateral 05/08/2017   Procedure: CYSTOSCOPY/ BILATERAL RETROGRADE;  Surgeon: Kathie Rhodes, MD;  Location: [facility];  Service: Urology;  Laterality: Bilateral;  . INGUINAL HERNIA REPAIR Right 10/10/2000  . KNEE ARTHROSCOPY Right 1980's  . NASAL FRACTURE SURGERY     in highschool   . SHOULDER ARTHROSCOPY WITH OPEN ROTATOR CUFF REPAIR Right 1990's  . TRANSURETHRAL RESECTION OF PROSTATE     Social History   Social History Narrative  . Not on file   Immunization History  Administered Date(s) Administered  . Influenza, High Dose Seasonal PF 10/12/2018  . Influenza,inj,Quad PF,6+ Mos 09/26/2017  . Influenza-Unspecified 09/26/2017  . Pneumococcal Conjugate-13 11/10/2017  . Tdap 11/02/2018  Objective: Vital Signs: There were no vitals taken for this visit.   Physical Exam   Musculoskeletal Exam: ***  CDAI Exam: CDAI Score: Not documented Patient Global Assessment: Not documented; Provider Global Assessment: Not documented Swollen: Not documented; Tender: Not documented Joint Exam   Not documented   There is currently no information documented on the homunculus. Go to the Rheumatology activity and complete the homunculus joint exam.  Investigation: No additional  findings.  Imaging: No results found.  Recent Labs: Lab Results  Component Value Date   WBC 6.0 01/28/2019   HGB 15.8 01/28/2019   PLT 256 12/17/2018   NA 144 01/28/2019   K 4.1 01/28/2019   CL 104 01/28/2019   CO2 19 (L) 01/28/2019   GLUCOSE 137 (H) 01/28/2019   BUN 41 (H) 01/28/2019   CREATININE 1.29 (H) 01/28/2019   BILITOT 0.3 01/28/2019   ALKPHOS 80 01/28/2019   AST 49 (H) 01/28/2019   ALT 43 01/28/2019   PROT 6.9 01/28/2019   ALBUMIN 4.4 01/28/2019   CALCIUM 9.6 01/28/2019   GFRAA 66 01/28/2019    Speciality Comments: No specialty comments available.  Procedures:  No procedures performed Allergies: Metformin and related and Sulfa antibiotics   Assessment / Plan:     Visit Diagnoses: No diagnosis found.   Orders: No orders of the defined types were placed in this encounter.  No orders of the defined types were placed in this encounter.   Face-to-face time spent with patient was *** minutes. Greater than 50% of time was spent in counseling and coordination of care.  Follow-Up Instructions: No follow-ups on file.   Ellen Henri, CMA  Note - This record has been created using Animal nutritionist.  Chart creation errors have been sought, but may not always  have been located. Such creation errors do not reflect on  the standard of medical care.

## 2019-06-13 ENCOUNTER — Ambulatory Visit (INDEPENDENT_AMBULATORY_CARE_PROVIDER_SITE_OTHER): Payer: PPO | Admitting: Family Medicine

## 2019-06-13 ENCOUNTER — Other Ambulatory Visit: Payer: Self-pay

## 2019-06-13 ENCOUNTER — Encounter (INDEPENDENT_AMBULATORY_CARE_PROVIDER_SITE_OTHER): Payer: Self-pay | Admitting: Family Medicine

## 2019-06-13 DIAGNOSIS — E669 Obesity, unspecified: Secondary | ICD-10-CM | POA: Diagnosis not present

## 2019-06-13 DIAGNOSIS — Z6830 Body mass index (BMI) 30.0-30.9, adult: Secondary | ICD-10-CM

## 2019-06-13 DIAGNOSIS — E1165 Type 2 diabetes mellitus with hyperglycemia: Secondary | ICD-10-CM | POA: Diagnosis not present

## 2019-06-13 DIAGNOSIS — E7849 Other hyperlipidemia: Secondary | ICD-10-CM | POA: Diagnosis not present

## 2019-06-16 NOTE — Progress Notes (Signed)
Office: [phone]  /  Fax: [phone] TeleHealth Visit:  Jerry York has verbally consented to this TeleHealth visit today. The patient is located at home, the provider is located at the [employer] [facility] office. The participants in this visit include the listed provider and patient. The visit was conducted today via Facetime.  HPI:   Chief Complaint: OBESITY Jerry York is here to discuss his progress with his obesity treatment plan. He is on the  follow the Category 3 plan and is following his eating plan approximately 70 % of the time. He states he is exercising 0 minutes 0 times per week. Jerry York is having a hard time finding deli meat so he has been doing deli meat but not 4 oz. He would like to do Category 3 plan. He is doing starches ar dinner frequently so experiencing elevated blood sugars after dinner. He is having left hip pain.  We were unable to weigh the patient today for this TeleHealth visit. He feels as if he has lost weight since his last visit. He has lost 0 lbs since starting treatment with [name].  Diabetes II Jerry York has a diagnosis of diabetes type II. Jerry York states fasting BGs range between 100 and 180 and denies any hypoglycemic episodes. He has been working on intensive lifestyle modifications including diet, exercise, and weight loss to help control his blood glucose levels. He is doing 30 units BID and has had a few blood sugars in the 300s after dinner.   Hyperlipidemia Jerry York has hyperlipidemia and has been trying to improve his cholesterol levels with intensive lifestyle modification including a low saturated fat diet, exercise and weight loss. He denies any chest pain, claudication or myalgias.  ASSESSMENT AND PLAN:  Type 2 diabetes mellitus with hyperglycemia, without long-term current use of insulin (HCC)  Other hyperlipidemia  Class 1 obesity with serious comorbidity and body mass index (BMI) of 30.0 to 30.9 in adult, unspecified obesity type   PLAN: Diabetes II Jerry York has been given extensive diabetes education by myself today including ideal fasting and post-prandial blood glucose readings, individual ideal HgA1c goals  and hypoglycemia prevention. We discussed the importance of good blood sugar control to decrease the likelihood of diabetic complications such as nephropathy, neuropathy, limb loss, blindness, coronary artery disease, and death. We discussed the importance of intensive lifestyle modification including diet, exercise and weight loss as the first line treatment for diabetes. Jerry York agrees to continue his diabetes medications and will follow up at the agreed upon time.  Hyperlipidemia Jerry York was informed of the American Heart Association Guidelines emphasizing intensive lifestyle modifications as the first line treatment for hyperlipidemia. We discussed many lifestyle modifications today in depth, and Jerry York will continue to work on decreasing saturated fats such as fatty red meat, butter and many fried foods. He will also increase vegetables and lean protein in his diet and continue to work on exercise and weight loss efforts. We will repeat fasting lipid panel at 1st in office appointment.   Obesity Jerry York is currently in the action stage of change. As such, his goal is to continue with weight loss efforts He has agreed to follow the Category 3 plan Jerry York has been instructed to work up to a goal of 150 minutes of combined cardio and strengthening exercise per week for weight loss and overall health benefits. We discussed the following Behavioral Modification Stratagies today: keeping healthy foods in the home, better snacking choices, planning for success, increasing lean protein intake, increasing vegetables and  work on [employer] and easy cooking plans   Jerry York has agreed to follow up with our clinic in 2 weeks. He was informed of the importance of frequent follow up visits to maximize his success with intensive  lifestyle modifications for his multiple health conditions.  ALLERGIES: Allergies  Allergen Reactions  . Metformin And Related Diarrhea  . Sulfa Antibiotics Other (See Comments)    "make my feet burn"    MEDICATIONS: Current Outpatient Medications on File Prior to Visit  Medication Sig Dispense Refill  . acetaminophen (TYLENOL) 325 MG tablet Take 650 mg by mouth every 6 (six) hours as needed for moderate pain.    [name] allopurinol (ZYLOPRIM) 300 MG tablet TAKE ONE TABLET BY MOUTH DAILY 90 tablet 0  . amLODipine (NORVASC) 5 MG tablet Take 1 tablet (5 mg total) by mouth daily. 90 tablet 1  . atorvastatin (LIPITOR) 20 MG tablet TAKE ONE TABLET BY MOUTH EVERY EVENING 90 tablet 0  . [name] Take 5,000 mg by mouth 2 (two) times daily.     . clonazePAM (KLONOPIN) 0.5 MG tablet Take 1 tablet (0.5 mg total) by mouth 2 (two) times daily. 180 tablet 0  . colchicine 0.6 MG tablet Take 1 tablet (0.6 mg total) by mouth daily. Take BID if experiencing a flare. 60 tablet 1  . Continuous Blood Gluc Sensor ([name] 14 DAY SENSOR) MISC Apply 1 kit topically every 14 (fourteen) days. 6 each 3  . furosemide (LASIX) 80 MG tablet Take 1 tablet (80 mg total) by mouth 2 (two) times daily. 180 tablet 3  . glucose blood ([name] TEST STRIPS) test strip Check blood sugars 2-3 times per day. DX: E11.9 300 each 2  . glucose monitoring kit ([name]) monitoring kit 1 each by Does not apply route as needed for other. DX Code: E11.9 1 each 0  . insulin lispro (HUMALOG) 100 UNIT/ML injection Inject 40 Units into the skin 3 (three) times daily before meals.    . metoprolol tartrate (LOPRESSOR) 100 MG tablet Take 1 tablet (100 mg total) by mouth 2 (two) times daily. 180 tablet 3  . NON FORMULARY [place] 6 1334m TID    . traZODone (DESYREL) 50 MG tablet Take 3 tablets (150 mg total) by mouth at bedtime as needed for sleep. 180 tablet 3   No current facility-administered medications on file prior to visit.      PAST MEDICAL HISTORY: Past Medical History:  Diagnosis Date  . Anxiety   . Bilateral renal cysts   . Foley catheter in place   . Gout    02-22-2017 acute right foot gout---  per pt resolved  . Gout   . Gross hematuria   . Heart murmur   . History of BPH 08/14/2017  . Hydronephrosis, left   . Hyperlipidemia   . Hypertension   . Renal insufficiency   . [place]umatoid arthritis ([place]   . Swelling   . Type 2 diabetes mellitus ([place]   . Urinary retention   . Wears glasses     PAST SURGICAL HISTORY: Past Surgical History:  Procedure Laterality Date  . APPENDECTOMY  age 68 . CYSTOSCOPY WITH URETEROSCOPY AND STENT PLACEMENT Left 05/08/2017   Procedure: URETEROSCOPY AND STENT PLACEMENT;  Surgeon: OKathie Rhodes MD;  [facility]  Service: Urology;  Laterality: Left;  . CYSTOSCOPY/RETROGRADE/URETEROSCOPY Bilateral 05/08/2017   Procedure: CYSTOSCOPY/ BILATERAL RETROGRADE;  Surgeon: OKathie Rhodes MD;  [facility]  Service: Urology;  Laterality:  Bilateral;  . INGUINAL HERNIA REPAIR Right 10/10/2000  . KNEE ARTHROSCOPY Right 1980's  . NASAL FRACTURE SURGERY     in highschool   . SHOULDER ARTHROSCOPY WITH OPEN ROTATOR CUFF REPAIR Right 1990's  . TRANSURETHRAL RESECTION OF PROSTATE      SOCIAL HISTORY: Social History   Tobacco Use  . Smoking status: Never Smoker  . Smokeless tobacco: Never Used  Substance Use Topics  . Alcohol use: No  . Drug use: No    FAMILY HISTORY: Family History  Problem Relation Age of Onset  . Congestive Heart Failure Mother        Related to valve disease.  Opted to treat medically  . Hypertension Mother   . Hyperlipidemia Mother   . Diabetes Mother   . Hypertension Father   . Hyperlipidemia Father   . Coronary artery disease Father 77       History of CABG  . Stroke Father   . Hypertension Brother   . Diabetes Brother   . Hypertension Daughter   . Hypertension Son   . Hypertension Brother    . Hypertension Brother     ROS: Review of Systems  Cardiovascular: Negative for chest pain and claudication.  Musculoskeletal: Negative for myalgias.  Endo/Heme/Allergies:       Negative for hypoglycemia     PHYSICAL EXAM: Pt in no acute distress  RECENT LABS AND TESTS: BMET    Component Value Date/Time   NA 144 01/28/2019 1413   K 4.1 01/28/2019 1413   CL 104 01/28/2019 1413   CO2 19 (L) 01/28/2019 1413   GLUCOSE 137 (H) 01/28/2019 1413   GLUCOSE 107 (H) 12/17/2018 1004   BUN 41 (H) 01/28/2019 1413   CREATININE 1.29 (H) 01/28/2019 1413   CREATININE 1.45 (H) 12/17/2018 1004   CALCIUM 9.6 01/28/2019 1413   GFRNONAA 57 (L) 01/28/2019 1413   GFRNONAA 50 (L) 12/17/2018 1004   GFRAA 66 01/28/2019 1413   GFRAA 58 (L) 12/17/2018 1004   Lab Results  Component Value Date   HGBA1C 6.7 (H) 08/14/2017   HGBA1C 8.3 (H) 01/15/2007   No results found for: INSULIN CBC    Component Value Date/Time   WBC 6.0 01/28/2019 1413   WBC 5.8 12/17/2018 1004   RBC 4.85 01/28/2019 1413   RBC 4.81 12/17/2018 1004   HGB 15.8 01/28/2019 1413   HCT 43.0 01/28/2019 1413   PLT 256 12/17/2018 1004   MCV 89 01/28/2019 1413   MCH 32.6 01/28/2019 1413   MCH 30.1 12/17/2018 1004   MCHC 36.7 (H) 01/28/2019 1413   MCHC 34.5 12/17/2018 1004   RDW 16.8 (H) 01/28/2019 1413   LYMPHSABS 1.9 01/28/2019 1413   MONOABS 0.8 11/03/2018 1407   EOSABS 0.1 01/28/2019 1413   BASOSABS 0.0 01/28/2019 1413   Iron/TIBC/Ferritin/ %Sat No results found for: IRON, TIBC, FERRITIN, IRONPCTSAT Lipid Panel     Component Value Date/Time   CHOL 278 (H) 01/28/2019 1413   TRIG 2,296 (HH) 01/28/2019 1413   HDL 14 (L) 01/28/2019 1413   CHOLHDL 4 11/21/2018 1102   VLDL 71.8 (H) 11/21/2018 1102   LDLCALC Comment 01/28/2019 1413   LDLDIRECT 63.0 11/21/2018 1102   Hepatic Function Panel     Component Value Date/Time   PROT 6.9 01/28/2019 1413   ALBUMIN 4.4 01/28/2019 1413   AST 49 (H) 01/28/2019 1413   ALT 43  01/28/2019 1413   ALKPHOS 80 01/28/2019 1413   BILITOT 0.3 01/28/2019 1413   BILIDIR 0.1  09/04/2008 0953      Component Value Date/Time   TSH 3.370 01/28/2019 1413   TSH 1.78 01/15/2007 0829      I, Renee Ramus, am acting as transcriptionist for Ilene Qua, MD   I have reviewed the above documentation for accuracy and completeness, and I agree with the above. - Ilene Qua, MD

## 2019-06-17 ENCOUNTER — Ambulatory Visit: Payer: Self-pay | Admitting: Rheumatology

## 2019-06-27 ENCOUNTER — Telehealth (INDEPENDENT_AMBULATORY_CARE_PROVIDER_SITE_OTHER): Payer: PPO | Admitting: Family Medicine

## 2019-06-27 ENCOUNTER — Encounter (INDEPENDENT_AMBULATORY_CARE_PROVIDER_SITE_OTHER): Payer: Self-pay | Admitting: Family Medicine

## 2019-06-27 ENCOUNTER — Other Ambulatory Visit: Payer: Self-pay

## 2019-06-27 DIAGNOSIS — I10 Essential (primary) hypertension: Secondary | ICD-10-CM

## 2019-06-27 DIAGNOSIS — E1165 Type 2 diabetes mellitus with hyperglycemia: Secondary | ICD-10-CM | POA: Diagnosis not present

## 2019-06-27 DIAGNOSIS — E669 Obesity, unspecified: Secondary | ICD-10-CM

## 2019-06-27 DIAGNOSIS — Z6831 Body mass index (BMI) 31.0-31.9, adult: Secondary | ICD-10-CM | POA: Diagnosis not present

## 2019-06-27 DIAGNOSIS — Z794 Long term (current) use of insulin: Secondary | ICD-10-CM | POA: Diagnosis not present

## 2019-06-27 NOTE — Progress Notes (Signed)
Office: [phone]  /  Fax: [phone] TeleHealth Visit:  Jerry York has verbally consented to this TeleHealth visit today. The patient is located at home, the provider is located at the [employer] [facility] office. The participants in this visit include the listed provider and patient. The visit was conducted today via face time.  HPI:   Chief Complaint: OBESITY Jerry York is here to discuss his progress with his obesity treatment plan. He is on the Category 2 plan and is following his eating plan approximately 90 % of the time. He states he is walking for 10 minutes 7 times per week. Jerry York weighed in at 188 lbs yesterday (down in 2 lbs since 2 visits ago). He still struggled with finding sliced [place] deli meat. He denies hunger or cravings. He never got to start Category 3. We were unable to weigh the patient today for this TeleHealth visit. He feels as if he has lost 2 lbs since his last visit. He has lost 0 lbs since starting treatment with [name].  Diabetes II with Hyperglycemia Jerry York has a diagnosis of diabetes type II. Jerry York states his average BGs were of 179 for 14 days. His fasting BGs range between 150 and 170's. He denies hypoglycemia or side effects of any medications. He has not decreased his insulin dosage (still on 30 units BID). He has been working on intensive lifestyle modifications including diet, exercise, and weight loss to help control his blood glucose levels.  Hypertension Jerry York is a 68 y.o. male with hypertension. Jerry York's blood pressure is well controlled at home at 130/80. He denies chest pain, chest pressure, or headaches. He is working on weight loss to help control his blood pressure with the goal of decreasing his risk of heart attack and stroke.   ASSESSMENT AND PLAN:  Type 2 diabetes mellitus with hyperglycemia, with long-term current use of insulin (HCC)  Essential hypertension  Class 1 obesity with serious comorbidity and body mass  index (BMI) of 31.0 to 31.9 in adult, unspecified obesity type  PLAN:  Diabetes II with Hyperglycemia Jerry York has been given extensive diabetes education by myself today including ideal fasting and post-prandial blood glucose readings, individual ideal Hgb A1c goals and hypoglycemia prevention. We discussed the importance of good blood sugar control to decrease the likelihood of diabetic complications such as nephropathy, neuropathy, limb loss, blindness, coronary artery disease, and death. We discussed the importance of intensive lifestyle modification including diet, exercise and weight loss as the first line treatment for diabetes. Jerry York agrees to continue his diabetes medications, and we will follow up on his BGs at next appointment. We will repeat labs at the end of July or early August. Jerry York agrees to follow up with our clinic in 5 weeks.  Hypertension We discussed sodium restriction, working on healthy weight loss, and a regular exercise program as the means to achieve improved blood pressure control. Jerry York agreed with this plan and agreed to follow up as directed. We will continue to monitor his blood pressure as well as his progress with the above lifestyle modifications. Jerry York agrees to continue his current medications, no change in dosages. He will watch for signs of hypotension as he continues his lifestyle modifications. Jerry York agrees to follow up with our clinic in 5 weeks.  Obesity Jerry York is currently in the action stage of change. As such, his goal is to continue with weight loss efforts He has agreed to follow the Category 3 plan Jerry York has been instructed to  work up to a goal of 150 minutes of combined cardio and strengthening exercise per week for weight loss and overall health benefits. We discussed the following Behavioral Modification Strategies today: increasing lean protein intake, increasing vegetables and work on meal planning and easy cooking plans, keeping healthy foods  in the home, and planning for success   Jerry York has agreed to follow up with our clinic in 5 weeks. He was informed of the importance of frequent follow up visits to maximize his success with intensive lifestyle modifications for his multiple health conditions.  ALLERGIES: Allergies  Allergen Reactions  . Metformin And Related Diarrhea  . Sulfa Antibiotics Other (See Comments)    "make my feet burn"    MEDICATIONS: Current Outpatient Medications on File Prior to Visit  Medication Sig Dispense Refill  . acetaminophen (TYLENOL) 325 MG tablet Take 650 mg by mouth every 6 (six) hours as needed for moderate pain.    Jerry York allopurinol (ZYLOPRIM) 300 MG tablet TAKE ONE TABLET BY MOUTH DAILY 90 tablet 0  . amLODipine (NORVASC) 5 MG tablet Take 1 tablet (5 mg total) by mouth daily. 90 tablet 1  . atorvastatin (LIPITOR) 20 MG tablet TAKE ONE TABLET BY MOUTH EVERY EVENING 90 tablet 0  . [name] Take 5,000 mg by mouth 2 (two) times daily.     . clonazePAM (KLONOPIN) 0.5 MG tablet Take 1 tablet (0.5 mg total) by mouth 2 (two) times daily. 180 tablet 0  . colchicine 0.6 MG tablet Take 1 tablet (0.6 mg total) by mouth daily. Take BID if experiencing a flare. 60 tablet 1  . Continuous Blood Gluc Sensor ([name] 14 DAY SENSOR) MISC Apply 1 kit topically every 14 (fourteen) days. 6 each 3  . furosemide (LASIX) 80 MG tablet Take 1 tablet (80 mg total) by mouth 2 (two) times daily. 180 tablet 3  . glucose blood ([name] TEST STRIPS) test strip Check blood sugars 2-3 times per day. DX: E11.9 300 each 2  . glucose monitoring kit ([name]) monitoring kit 1 each by Does not apply route as needed for other. DX Code: E11.9 1 each 0  . insulin lispro (HUMALOG) 100 UNIT/ML injection Inject 40 Units into the skin 3 (three) times daily before meals.    . metoprolol tartrate (LOPRESSOR) 100 MG tablet Take 1 tablet (100 mg total) by mouth 2 (two) times daily. 180 tablet 3  . NON FORMULARY [place] 6 1358m  TID    . traZODone (DESYREL) 50 MG tablet Take 3 tablets (150 mg total) by mouth at bedtime as needed for sleep. 180 tablet 3   No current facility-administered medications on file prior to visit.     PAST MEDICAL HISTORY: Past Medical History:  Diagnosis Date  . Anxiety   . Bilateral renal cysts   . Foley catheter in place   . Gout    02-22-2017 acute right foot gout---  per pt resolved  . Gout   . Gross hematuria   . Heart murmur   . History of BPH 08/14/2017  . Hydronephrosis, left   . Hyperlipidemia   . Hypertension   . Renal insufficiency   . [place]   . Swelling   . Type 2 diabetes mellitus ([place]   . Urinary retention   . Wears glasses     PAST SURGICAL HISTORY: Past Surgical History:  Procedure Laterality Date  . APPENDECTOMY  age 68 . CYSTOSCOPY WITH URETEROSCOPY AND STENT PLACEMENT Left 05/08/2017   Procedure:  URETEROSCOPY AND STENT PLACEMENT;  Surgeon: Kathie Rhodes, MD;  Location: [facility];  Service: Urology;  Laterality: Left;  . CYSTOSCOPY/RETROGRADE/URETEROSCOPY Bilateral 05/08/2017   Procedure: CYSTOSCOPY/ BILATERAL RETROGRADE;  Surgeon: Kathie Rhodes, MD;  Location: [facility];  Service: Urology;  Laterality: Bilateral;  . INGUINAL HERNIA REPAIR Right 10/10/2000  . KNEE ARTHROSCOPY Right 1980's  . NASAL FRACTURE SURGERY     in highschool   . SHOULDER ARTHROSCOPY WITH OPEN ROTATOR CUFF REPAIR Right 1990's  . TRANSURETHRAL RESECTION OF PROSTATE      SOCIAL HISTORY: Social History   Tobacco Use  . Smoking status: Never Smoker  . Smokeless tobacco: Never Used  Substance Use Topics  . Alcohol use: No  . Drug use: No    FAMILY HISTORY: Family History  Problem Relation Age of Onset  . Congestive Heart Failure Mother        Related to valve disease.  Opted to treat medically  . Hypertension Mother   . Hyperlipidemia Mother   . Diabetes Mother   . Hypertension Father   . Hyperlipidemia Father    . Coronary artery disease Father 60       History of CABG  . Stroke Father   . Hypertension Brother   . Diabetes Brother   . Hypertension Daughter   . Hypertension Son   . Hypertension Brother   . Hypertension Brother     ROS: Review of Systems  Constitutional: Positive for weight loss.  Cardiovascular: Negative for chest pain.       Negative chest pressure  Neurological: Negative for headaches.  Endo/Heme/Allergies:       Negative hypoglycemia    PHYSICAL EXAM: Pt in no acute distress  RECENT LABS AND TESTS: BMET    Component Value Date/Time   NA 144 01/28/2019 1413   K 4.1 01/28/2019 1413   CL 104 01/28/2019 1413   CO2 19 (L) 01/28/2019 1413   GLUCOSE 137 (H) 01/28/2019 1413   GLUCOSE 107 (H) 12/17/2018 1004   BUN 41 (H) 01/28/2019 1413   CREATININE 1.29 (H) 01/28/2019 1413   CREATININE 1.45 (H) 12/17/2018 1004   CALCIUM 9.6 01/28/2019 1413   GFRNONAA 57 (L) 01/28/2019 1413   GFRNONAA 50 (L) 12/17/2018 1004   GFRAA 66 01/28/2019 1413   GFRAA 58 (L) 12/17/2018 1004   Lab Results  Component Value Date   HGBA1C 6.7 (H) 08/14/2017   HGBA1C 8.3 (H) 01/15/2007   No results found for: INSULIN CBC    Component Value Date/Time   WBC 6.0 01/28/2019 1413   WBC 5.8 12/17/2018 1004   RBC 4.85 01/28/2019 1413   RBC 4.81 12/17/2018 1004   HGB 15.8 01/28/2019 1413   HCT 43.0 01/28/2019 1413   PLT 256 12/17/2018 1004   MCV 89 01/28/2019 1413   MCH 32.6 01/28/2019 1413   MCH 30.1 12/17/2018 1004   MCHC 36.7 (H) 01/28/2019 1413   MCHC 34.5 12/17/2018 1004   RDW 16.8 (H) 01/28/2019 1413   LYMPHSABS 1.9 01/28/2019 1413   MONOABS 0.8 11/03/2018 1407   EOSABS 0.1 01/28/2019 1413   BASOSABS 0.0 01/28/2019 1413   Iron/TIBC/Ferritin/ %Sat No results found for: IRON, TIBC, FERRITIN, IRONPCTSAT Lipid Panel     Component Value Date/Time   CHOL 278 (H) 01/28/2019 1413   TRIG 2,296 (HH) 01/28/2019 1413   HDL 14 (L) 01/28/2019 1413   CHOLHDL 4 11/21/2018 1102   VLDL  71.8 (H) 11/21/2018 1102   [place] Comment 01/28/2019 1413  LDLDIRECT 63.0 11/21/2018 1102   Hepatic Function Panel     Component Value Date/Time   PROT 6.9 01/28/2019 1413   ALBUMIN 4.4 01/28/2019 1413   AST 49 (H) 01/28/2019 1413   ALT 43 01/28/2019 1413   ALKPHOS 80 01/28/2019 1413   BILITOT 0.3 01/28/2019 1413   BILIDIR 0.1 09/04/2008 0953      Component Value Date/Time   TSH 3.370 01/28/2019 1413   TSH 1.78 01/15/2007 0829      I, Trixie Dredge, am acting as transcriptionist for Ilene Qua, MD  I have reviewed the above documentation for accuracy and completeness, and I agree with the above. - Ilene Qua, MD

## 2019-07-09 ENCOUNTER — Other Ambulatory Visit: Payer: Self-pay | Admitting: Family Medicine

## 2019-07-09 NOTE — Progress Notes (Deleted)
Office Visit Note  Patient: Jerry York             Date of Birth: 01/13/1951           MRN: [id number]             PCP: [name], MD Referring: [name], MD Visit Date: 07/23/2019 Occupation: @GUAROCC @  Subjective:  No chief complaint on file.   History of Present Illness: Jerry York is a 68 y.o. male ***   Activities of Daily Living:  Patient reports morning stiffness for *** {minute/hour:19697}.   Patient {ACTIONS;DENIES/REPORTS:21021675::"Denies"} nocturnal pain.  Difficulty dressing/grooming: {ACTIONS;DENIES/REPORTS:21021675::"Denies"} Difficulty climbing stairs: {ACTIONS;DENIES/REPORTS:21021675::"Denies"} Difficulty getting out of chair: {ACTIONS;DENIES/REPORTS:21021675::"Denies"} Difficulty using hands for taps, buttons, cutlery, and/or writing: {ACTIONS;DENIES/REPORTS:21021675::"Denies"}  No Rheumatology ROS completed.   PMFS History:  Patient Active Problem List   Diagnosis Date Noted  . Lower extremity edema 12/11/2017  . Family history of premature CAD 11/27/2017  . 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 Type 2 diabetes mellitus ([place]) 08/09/2017  . Gout 08/09/2017  . Anxiety 08/09/2017  . Hyperlipidemia associated with type 2 diabetes mellitus ([place]) 08/08/2017  . Hypertension associated with diabetes ([place]) 08/08/2017    Past Medical History:  Diagnosis Date  . Anxiety   . Bilateral renal cysts   . Foley catheter in place   . Gout    02-22-2017 acute right foot gout---  per pt resolved  . Gout   . Gross hematuria   . Heart murmur   . History of BPH 08/14/2017  . Hydronephrosis, left   . Hyperlipidemia   . Hypertension   . Renal insufficiency   . Rheumatoid arthritis ([place])   . Swelling   . Type 2 diabetes mellitus ([place])   . Urinary retention   . Wears glasses     Family History  Problem Relation Age of Onset  . Congestive Heart Failure Mother        Related to valve disease.  Opted to  treat medically  . Hypertension Mother   . Hyperlipidemia Mother   . Diabetes Mother   . Hypertension Father   . Hyperlipidemia Father   . Coronary artery disease Father 61       History of CABG  . Stroke Father   . Hypertension Brother   . Diabetes Brother   . Hypertension Daughter   . Hypertension Son   . Hypertension Brother   . Hypertension Brother    Past Surgical History:  Procedure Laterality Date  . APPENDECTOMY  age 3  . CYSTOSCOPY WITH URETEROSCOPY AND STENT PLACEMENT Left 05/08/2017   Procedure: URETEROSCOPY AND STENT PLACEMENT;  Surgeon: Kathie Rhodes, MD;  Location[facility];  Service: Urology;  Laterality: Left;  . CYSTOSCOPY/RETROGRADE/URETEROSCOPY Bilateral 05/08/2017   Procedure: CYSTOSCOPY/ BILATERAL RETROGRADE;  Surgeon: Kathie Rhodes, MD;  Location: [facility];  Service: Urology;  Laterality: Bilateral;  . INGUINAL HERNIA REPAIR Right 10/10/2000  . KNEE ARTHROSCOPY Right 1980's  . NASAL FRACTURE SURGERY     in highschool   . SHOULDER ARTHROSCOPY WITH OPEN ROTATOR CUFF REPAIR Right 1990's  . TRANSURETHRAL RESECTION OF PROSTATE     Social History   Social History Narrative  . Not on file   Immunization History  Administered Date(s) Administered  . Influenza, High Dose Seasonal PF 10/12/2018  . Influenza,inj,Quad PF,6+ Mos 09/26/2017  . Influenza-Unspecified 09/26/2017  . Pneumococcal Conjugate-13 11/10/2017  . Tdap 11/02/2018  Objective: Vital Signs: There were no vitals taken for this visit.   Physical Exam   Musculoskeletal Exam: ***  CDAI Exam: CDAI Score: - Patient Global: -; Provider Global: - Swollen: -; Tender: - Joint Exam   No joint exam has been documented for this visit   There is currently no information documented on the homunculus. Go to the Rheumatology activity and complete the homunculus joint exam.  Investigation: No additional findings.  Imaging: No results found.  Recent Labs:  Lab Results  Component Value Date   WBC 6.0 01/28/2019   HGB 15.8 01/28/2019   PLT 256 12/17/2018   NA 144 01/28/2019   K 4.1 01/28/2019   CL 104 01/28/2019   CO2 19 (L) 01/28/2019   GLUCOSE 137 (H) 01/28/2019   BUN 41 (H) 01/28/2019   CREATININE 1.29 (H) 01/28/2019   BILITOT 0.3 01/28/2019   ALKPHOS 80 01/28/2019   AST 49 (H) 01/28/2019   ALT 43 01/28/2019   PROT 6.9 01/28/2019   ALBUMIN 4.4 01/28/2019   CALCIUM 9.6 01/28/2019   GFRAA 66 01/28/2019    Speciality Comments: No specialty comments available.  Procedures:  No procedures performed Allergies: Metformin and related and Sulfa antibiotics   Assessment / Plan:     Visit Diagnoses: No diagnosis found.  Orders: No orders of the defined types were placed in this encounter.  No orders of the defined types were placed in this encounter.   Face-to-face time spent with patient was *** minutes. Greater than 50% of time was spent in counseling and coordination of care.  Follow-Up Instructions: No follow-ups on file.   Gearldine Bienenstock, PA-C  Note - This record has been created using Dragon software.  Chart creation errors have been sought, but may not always  have been located. Such creation errors do not reflect on  the standard of medical care.

## 2019-07-10 ENCOUNTER — Encounter: Payer: Self-pay | Admitting: Family Medicine

## 2019-07-10 ENCOUNTER — Other Ambulatory Visit: Payer: Self-pay

## 2019-07-10 ENCOUNTER — Telehealth: Payer: Self-pay | Admitting: Family Medicine

## 2019-07-10 MED ORDER — ALLOPURINOL 100 MG PO TABS: 300.0000 mg | ORAL_TABLET | Freq: Every day | ORAL | 3 refills | Status: DC

## 2019-07-10 MED ORDER — ALLOPURINOL 300 MG PO TABS: 300.0000 mg | ORAL_TABLET | Freq: Every day | ORAL | 0 refills | Status: DC

## 2019-07-10 NOTE — Telephone Encounter (Signed)
allopurinol (ZYLOPRIM) 300 MG tablet  Pt states he needs to take allopurinol (ZYLOPRIM) 100 MG tablet because 300mg  tablets are too strong and make him sick.  Please advise.

## 2019-07-10 NOTE — Telephone Encounter (Signed)
Rx sent to pharmacy   

## 2019-07-11 ENCOUNTER — Telehealth (INDEPENDENT_AMBULATORY_CARE_PROVIDER_SITE_OTHER): Payer: PPO | Admitting: Family Medicine

## 2019-07-11 ENCOUNTER — Other Ambulatory Visit: Payer: Self-pay

## 2019-07-11 ENCOUNTER — Encounter (INDEPENDENT_AMBULATORY_CARE_PROVIDER_SITE_OTHER): Payer: Self-pay | Admitting: Family Medicine

## 2019-07-11 DIAGNOSIS — Z6832 Body mass index (BMI) 32.0-32.9, adult: Secondary | ICD-10-CM

## 2019-07-11 DIAGNOSIS — E669 Obesity, unspecified: Secondary | ICD-10-CM | POA: Diagnosis not present

## 2019-07-11 DIAGNOSIS — E7849 Other hyperlipidemia: Secondary | ICD-10-CM

## 2019-07-11 DIAGNOSIS — E1165 Type 2 diabetes mellitus with hyperglycemia: Secondary | ICD-10-CM | POA: Diagnosis not present

## 2019-07-11 DIAGNOSIS — Z794 Long term (current) use of insulin: Secondary | ICD-10-CM

## 2019-07-16 NOTE — Progress Notes (Signed)
Office: [phone]  /  Fax: [phone] TeleHealth Visit:  Jerry York has verbally consented to this TeleHealth visit today. The patient is located in his car, the provider is located at the [employer] [facility] office. The participants in this visit include the listed provider and patient and any and all parties involved. The visit was conducted today via FaceTime.  HPI:   Chief Complaint: OBESITY Jerry York is here to discuss his progress with his obesity treatment plan. He is on the Category 3 plan and is following his eating plan approximately 90 % of the time. He states he is walking 5 minutes 7 times per week. Jerry York has been having a few good weeks. He is eating eggs (2, no cheese) and toast and no milk for breakfast. He is eating a [place] sandwich with 4 ounces of [place] and cheese, yogurt and apple for lunch. Jerry York is eating brussel sprouts or broccoli (not a full 8 to 10 ounces, probably approximately 6 ounces) for dinner. For snacks he is eating [place] balance breaks. Jerry York denies hunger. We were unable to weigh the patient today for this TeleHealth visit. He feels as if he has lost weight since his last visit. He has lost 0 lbs since starting treatment with [name].  Diabetes II Jerry York has a diagnosis of diabetes type II. He is still doing twice daily insulin and he is not on Victoza/GLP1. Jerry York states fasting BGs are in the 170's approximately and his blood sugars average between 135 and 179 and he denies any hypoglycemic episodes. He has been working on intensive lifestyle modifications including diet, exercise, and weight loss to help control his blood glucose levels.  Hyperlipidemia Jerry York has hyperlipidemia and his LDL is significantly elevated. Jerry York has been trying to improve his cholesterol levels with intensive lifestyle modification including a low saturated fat diet, exercise and weight loss. He is currently on Crestor. He denies any chest pain or myalgias.   ASSESSMENT AND PLAN:  No diagnosis found.  PLAN:  Diabetes II Jerry York has been given extensive diabetes education by myself today including ideal fasting and post-prandial blood glucose readings, individual ideal Hgb A1c goals and hypoglycemia prevention. We discussed the importance of good blood sugar control to decrease the likelihood of diabetic complications such as nephropathy, neuropathy, limb loss, blindness, coronary artery disease, and death. We discussed the importance of intensive lifestyle modification including diet, exercise and weight loss as the first line treatment for diabetes. We will check labs at the next appointment. Jerry York agrees to continue his diabetes medications and will follow up at the agreed upon time.  Hyperlipidemia Mouhamad was informed of the American Heart Association Guidelines emphasizing intensive lifestyle modifications as the first line treatment for hyperlipidemia. We discussed many lifestyle modifications today in depth, and Jerry York will continue to work on decreasing saturated fats such as fatty red meat, butter and many fried foods. He will also increase vegetables and lean protein in his diet and continue to work on exercise and weight loss efforts. We will check labs at the next appointment.  Obesity Clement is currently in the action stage of change. As such, his goal is to continue with weight loss efforts He has agreed to follow the Category 3 plan Cheskel has been instructed to work up to a goal of 150 minutes of combined cardio and strengthening exercise per week for weight loss and overall health benefits. We discussed the following Behavioral Modification Strategies today: planning for success, keeping healthy foods in the  home, better snacking choices, increasing lean protein intake, increasing vegetables and work on meal planning and easy cooking plans Jerry York has not been getting all of the food in and substitutions were encouraged if he is not able  to eat all of the protein.  Jerry York has agreed to follow up with our clinic in 2 weeks. He was informed of the importance of frequent follow up visits to maximize his success with intensive lifestyle modifications for his multiple health conditions.  ALLERGIES: Allergies  Allergen Reactions  . Metformin And Related Diarrhea  . Sulfa Antibiotics Other (See Comments)    "make my feet burn"    MEDICATIONS: Current Outpatient Medications on File Prior to Visit  Medication Sig Dispense Refill  . acetaminophen (TYLENOL) 325 MG tablet Take 650 mg by mouth every 6 (six) hours as needed for moderate pain.    Jerry York Kitchen allopurinol (ZYLOPRIM) 100 MG tablet Take 3 tablets (300 mg total) by mouth daily. 90 tablet 3  . amLODipine (NORVASC) 5 MG tablet Take 1 tablet (5 mg total) by mouth daily. 90 tablet 1  . atorvastatin (LIPITOR) 20 MG tablet TAKE ONE TABLET BY MOUTH EVERY EVENING 90 tablet 0  . Jerry York PO Take 5,000 mg by mouth 2 (two) times daily.     . Jerry York (KLONOPIN) 0.5 MG tablet Take 1 tablet (0.5 mg total) by mouth 2 (two) times daily. 180 tablet 0  . colchicine 0.6 MG tablet Take 1 tablet (0.6 mg total) by mouth daily. Take BID if experiencing a flare. 60 tablet 1  . Continuous Blood Gluc Sensor ([name] 14 DAY SENSOR) MISC Apply 1 kit topically every 14 (fourteen) days. 6 each 3  . furosemide (LASIX) 80 MG tablet Take 1 tablet (80 mg total) by mouth 2 (two) times daily. 180 tablet 3  . glucose blood ([name] TEST STRIPS) test strip Check blood sugars 2-3 times per day. DX: E11.9 300 each 2  . glucose monitoring kit ([name]) monitoring kit 1 each by Does not apply route as needed for other. DX Code: E11.9 1 each 0  . insulin lispro (HUMALOG) 100 UNIT/ML injection Inject 40 Units into the skin 3 (three) times daily before meals.    . metoprolol tartrate (LOPRESSOR) 100 MG tablet Take 1 tablet (100 mg total) by mouth 2 (two) times daily. 180 tablet 3  . NON FORMULARY Rehmannia 6  1364m TID    . traZODone (DESYREL) 50 MG tablet Take 3 tablets (150 mg total) by mouth at bedtime as needed for sleep. 180 tablet 3   No current facility-administered medications on file prior to visit.     PAST MEDICAL HISTORY: Past Medical History:  Diagnosis Date  . Anxiety   . Bilateral renal cysts   . Foley catheter in place   . Gout    02-22-2017 acute right foot gout---  per pt resolved  . Gout   . Gross hematuria   . Heart murmur   . History of BPH 08/14/2017  . Hydronephrosis, left   . Hyperlipidemia   . Hypertension   . Renal insufficiency   . Rheumatoid arthritis ([place]   . Swelling   . Type 2 diabetes mellitus ([place]   . Urinary retention   . Wears glasses     PAST SURGICAL HISTORY: Past Surgical History:  Procedure Laterality Date  . APPENDECTOMY  age 68 . CYSTOSCOPY WITH URETEROSCOPY AND STENT PLACEMENT Left 05/08/2017   Procedure: URETEROSCOPY AND STENT PLACEMENT;  Surgeon: OKathie Rhodes MD;  Location: [place];  Service: Urology;  Laterality: Left;  . CYSTOSCOPY/RETROGRADE/URETEROSCOPY Bilateral 05/08/2017   Procedure: CYSTOSCOPY/ BILATERAL RETROGRADE;  Surgeon: Kathie Rhodes, MD;  Location: [facility];  Service: Urology;  Laterality: Bilateral;  . INGUINAL HERNIA REPAIR Right 10/10/2000  . KNEE ARTHROSCOPY Right 1980's  . NASAL FRACTURE SURGERY     in highschool   . SHOULDER ARTHROSCOPY WITH OPEN ROTATOR CUFF REPAIR Right 1990's  . TRANSURETHRAL RESECTION OF PROSTATE      SOCIAL HISTORY: Social History   Tobacco Use  . Smoking status: Never Smoker  . Smokeless tobacco: Never Used  Substance Use Topics  . Alcohol use: No  . Drug use: No    FAMILY HISTORY: Family History  Problem Relation Age of Onset  . Congestive Heart Failure Mother        Related to valve disease.  Opted to treat medically  . Hypertension Mother   . Hyperlipidemia Mother   . Diabetes Mother   . Hypertension Father   . Hyperlipidemia  Father   . Coronary artery disease Father 2       History of CABG  . Stroke Father   . Hypertension Brother   . Diabetes Brother   . Hypertension Daughter   . Hypertension Son   . Hypertension Brother   . Hypertension Brother     ROS: Review of Systems  Constitutional: Positive for weight loss.  Cardiovascular: Negative for chest pain.  Musculoskeletal: Negative for myalgias.  Endo/Heme/Allergies:       Negative for hypoglycemia    PHYSICAL EXAM: Pt in no acute distress  RECENT LABS AND TESTS: BMET    Component Value Date/Time   NA 144 01/28/2019 1413   K 4.1 01/28/2019 1413   CL 104 01/28/2019 1413   CO2 19 (L) 01/28/2019 1413   GLUCOSE 137 (H) 01/28/2019 1413   GLUCOSE 107 (H) 12/17/2018 1004   BUN 41 (H) 01/28/2019 1413   CREATININE 1.29 (H) 01/28/2019 1413   CREATININE 1.45 (H) 12/17/2018 1004   CALCIUM 9.6 01/28/2019 1413   GFRNONAA 57 (L) 01/28/2019 1413   GFRNONAA 50 (L) 12/17/2018 1004   GFRAA 66 01/28/2019 1413   GFRAA 58 (L) 12/17/2018 1004   Lab Results  Component Value Date   HGBA1C 6.7 (H) 08/14/2017   HGBA1C 8.3 (H) 01/15/2007   No results found for: INSULIN CBC    Component Value Date/Time   WBC 6.0 01/28/2019 1413   WBC 5.8 12/17/2018 1004   RBC 4.85 01/28/2019 1413   RBC 4.81 12/17/2018 1004   HGB 15.8 01/28/2019 1413   HCT 43.0 01/28/2019 1413   PLT 256 12/17/2018 1004   MCV 89 01/28/2019 1413   MCH 32.6 01/28/2019 1413   MCH 30.1 12/17/2018 1004   MCHC 36.7 (H) 01/28/2019 1413   MCHC 34.5 12/17/2018 1004   RDW 16.8 (H) 01/28/2019 1413   LYMPHSABS 1.9 01/28/2019 1413   MONOABS 0.8 11/03/2018 1407   EOSABS 0.1 01/28/2019 1413   BASOSABS 0.0 01/28/2019 1413   Iron/TIBC/Ferritin/ %Sat No results found for: IRON, TIBC, FERRITIN, IRONPCTSAT Lipid Panel     Component Value Date/Time   CHOL 278 (H) 01/28/2019 1413   TRIG 2,296 (HH) 01/28/2019 1413   HDL 14 (L) 01/28/2019 1413   CHOLHDL 4 11/21/2018 1102   VLDL 71.8 (H)  11/21/2018 1102   LDLCALC Comment 01/28/2019 1413   LDLDIRECT 63.0 11/21/2018 1102   Hepatic Function Panel     Component Value Date/Time  PROT 6.9 01/28/2019 1413   ALBUMIN 4.4 01/28/2019 1413   AST 49 (H) 01/28/2019 1413   ALT 43 01/28/2019 1413   ALKPHOS 80 01/28/2019 1413   BILITOT 0.3 01/28/2019 1413   BILIDIR 0.1 09/04/2008 0953      Component Value Date/Time   TSH 3.370 01/28/2019 1413   TSH 1.78 01/15/2007 0829      I, Doreene Nest, am acting as transcriptionist for Eber Jones, MD   I have reviewed the above documentation for accuracy and completeness, and I agree with the above. - Ilene Qua, MD

## 2019-07-19 DIAGNOSIS — M545 Low back pain: Secondary | ICD-10-CM | POA: Diagnosis not present

## 2019-07-23 ENCOUNTER — Ambulatory Visit: Payer: PPO | Admitting: Physician Assistant

## 2019-07-25 DIAGNOSIS — M545 Low back pain: Secondary | ICD-10-CM | POA: Diagnosis not present

## 2019-07-29 ENCOUNTER — Other Ambulatory Visit: Payer: Self-pay

## 2019-07-29 ENCOUNTER — Telehealth (INDEPENDENT_AMBULATORY_CARE_PROVIDER_SITE_OTHER): Payer: PPO | Admitting: Family Medicine

## 2019-07-29 ENCOUNTER — Encounter (INDEPENDENT_AMBULATORY_CARE_PROVIDER_SITE_OTHER): Payer: Self-pay | Admitting: Family Medicine

## 2019-07-29 DIAGNOSIS — E1165 Type 2 diabetes mellitus with hyperglycemia: Secondary | ICD-10-CM | POA: Diagnosis not present

## 2019-07-29 DIAGNOSIS — Z6832 Body mass index (BMI) 32.0-32.9, adult: Secondary | ICD-10-CM | POA: Diagnosis not present

## 2019-07-29 DIAGNOSIS — E669 Obesity, unspecified: Secondary | ICD-10-CM | POA: Diagnosis not present

## 2019-07-29 DIAGNOSIS — M48061 Spinal stenosis, lumbar region without neurogenic claudication: Secondary | ICD-10-CM | POA: Diagnosis not present

## 2019-07-29 DIAGNOSIS — E7849 Other hyperlipidemia: Secondary | ICD-10-CM

## 2019-07-29 DIAGNOSIS — Z794 Long term (current) use of insulin: Secondary | ICD-10-CM | POA: Diagnosis not present

## 2019-07-29 NOTE — Progress Notes (Signed)
Office: [phone]  /  Fax: [phone] TeleHealth Visit:  Jerry York has verbally consented to this TeleHealth visit today. The patient is located at home, the provider is located at the [employer] [facility] office. The participants in this visit include the listed provider and patient. The visit was conducted today via face time.  HPI:   Chief Complaint: OBESITY Jerry York is here to discuss his progress with his obesity treatment plan. He is on the Category 3 plan and is following his eating plan approximately 90 % of the time. He states he is exercising 0 minutes 0 times per week. Walker voices for breakfast he has 2 pieces of toast an 2 eggs. For lunch, he has [place] or ham with cheese and 2 pieces of bread. For dinner, he has chicken or beef (5 oz), broccoli (1 cup) or tries for 4 oz, and balance breaks for snacks. He states his blood pressure was 120/80 and his weight is of 187 lbs. We were unable to weigh the patient today for this TeleHealth visit. He feels as if he has lost 1 lb since his last visit. He has lost 0 lbs since starting treatment with [name].  Diabetes II with Hyperglycemia [name] has a diagnosis of diabetes type II. Lincoln states his average BGs for the last 7 days is 189. His fasting BGs range between 170 and 180's. No low blood sugars and he denies hypoglycemic episodes. He has been working on intensive lifestyle modifications including diet, exercise, and weight loss to help control his blood glucose levels.  Hyperlipidemia [name] has hyperlipidemia and has been trying to improve his cholesterol levels with intensive lifestyle modification including a low saturated fat diet, exercise and weight loss. Last LDL was very elevated on last read. He is on statin and denies any chest pain, claudication or myalgias.  ASSESSMENT AND PLAN:  Type 2 diabetes mellitus with hyperglycemia, with long-term current use of insulin (HCC)  Other hyperlipidemia  Class 1 obesity  with serious comorbidity and body mass index (BMI) of 32.0 to 32.9 in adult, unspecified obesity type  PLAN:  Diabetes II with Hyperglycemia Bence has been given extensive diabetes education by myself today including ideal fasting and post-prandial blood glucose readings, individual ideal Hgb A1c goals and hypoglycemia prevention. We discussed the importance of good blood sugar control to decrease the likelihood of diabetic complications such as nephropathy, neuropathy, limb loss, blindness, coronary artery disease, and death. We discussed the importance of intensive lifestyle modification including diet, exercise and weight loss as the first line treatment for diabetes. [name] agrees to continue his current diabetes medications, and he agrees to follow up with our clinic in 3 weeks.   Hyperlipidemia [name] was informed of the American Heart Association Guidelines emphasizing intensive lifestyle modifications as the first line treatment for hyperlipidemia. We discussed many lifestyle modifications today in depth, and [name] will continue to work on decreasing saturated fats such as fatty red meat, butter and many fried foods. He will also increase vegetables and lean protein in his diet and continue to work on exercise and weight loss efforts. He will need labs at his next appointment. [name] agrees to follow up with our clinic in 3 weeks.  Obesity [name] is currently in the action stage of change. As such, his goal is to continue with weight loss efforts He has agreed to follow the Category 3 plan [name] has been instructed to work up to a goal of 150 minutes of combined cardio and  strengthening exercise per week for weight loss and overall health benefits. We discussed the following Behavioral Modification Strategies today: increasing lean protein intake, increasing vegetables and work on meal planning and easy cooking plans, keeping healthy foods in the home, and planning for success   [name]  has agreed to follow up with our clinic in 3 weeks. He was informed of the importance of frequent follow up visits to maximize his success with intensive lifestyle modifications for his multiple health conditions.  ALLERGIES: Allergies  Allergen Reactions  . Metformin And Related Diarrhea  . Sulfa Antibiotics Other (See Comments)    "make my feet burn"    MEDICATIONS: Current Outpatient Medications on File Prior to Visit  Medication Sig Dispense Refill  . acetaminophen (TYLENOL) 325 MG tablet Take 650 mg by mouth every 6 (six) hours as needed for moderate pain.    [name] allopurinol (ZYLOPRIM) 100 MG tablet Take 3 tablets (300 mg total) by mouth daily. 90 tablet 3  . amLODipine (NORVASC) 5 MG tablet Take 1 tablet (5 mg total) by mouth daily. 90 tablet 1  . atorvastatin (LIPITOR) 20 MG tablet TAKE ONE TABLET BY MOUTH EVERY EVENING 90 tablet 0  . [name] Take 5,000 mg by mouth 2 (two) times daily.     . clonazePAM (KLONOPIN) 0.5 MG tablet Take 1 tablet (0.5 mg total) by mouth 2 (two) times daily. 180 tablet 0  . colchicine 0.6 MG tablet Take 1 tablet (0.6 mg total) by mouth daily. Take BID if experiencing a flare. 60 tablet 1  . Continuous Blood Gluc Sensor ([name] 14 DAY SENSOR) MISC Apply 1 kit topically every 14 (fourteen) days. 6 each 3  . furosemide (LASIX) 80 MG tablet Take 1 tablet (80 mg total) by mouth 2 (two) times daily. 180 tablet 3  . glucose blood ([name] TEST STRIPS) test strip Check blood sugars 2-3 times per day. DX: E11.9 300 each 2  . glucose monitoring kit ([name]) monitoring kit 1 each by Does not apply route as needed for other. DX Code: E11.9 1 each 0  . insulin lispro (HUMALOG) 100 UNIT/ML injection Inject 40 Units into the skin 3 (three) times daily before meals.    . metoprolol tartrate (LOPRESSOR) 100 MG tablet Take 1 tablet (100 mg total) by mouth 2 (two) times daily. 180 tablet 3  . NON FORMULARY Rehmannia 6 1375m TID    . traZODone (DESYREL) 50 MG  tablet Take 3 tablets (150 mg total) by mouth at bedtime as needed for sleep. 180 tablet 3   No current facility-administered medications on file prior to visit.     PAST MEDICAL HISTORY: Past Medical History:  Diagnosis Date  . Anxiety   . Bilateral renal cysts   . Foley catheter in place   . Gout    02-22-2017 acute right foot gout---  per pt resolved  . Gout   . Gross hematuria   . Heart murmur   . History of BPH 08/14/2017  . Hydronephrosis, left   . Hyperlipidemia   . Hypertension   . Renal insufficiency   . Rheumatoid arthritis ([facility]   . Swelling   . Type 2 diabetes mellitus ([place]   . Urinary retention   . Wears glasses     PAST SURGICAL HISTORY: Past Surgical History:  Procedure Laterality Date  . APPENDECTOMY  age 68 . CYSTOSCOPY WITH URETEROSCOPY AND STENT PLACEMENT Left 05/08/2017   Procedure: URETEROSCOPY AND STENT PLACEMENT;  Surgeon: OKathie Rhodes MD;  Location: [place];  Service: Urology;  Laterality: Left;  . CYSTOSCOPY/RETROGRADE/URETEROSCOPY Bilateral 05/08/2017   Procedure: CYSTOSCOPY/ BILATERAL RETROGRADE;  Surgeon: Kathie Rhodes, MD;  Location: [facility];  Service: Urology;  Laterality: Bilateral;  . INGUINAL HERNIA REPAIR Right 10/10/2000  . KNEE ARTHROSCOPY Right 1980's  . NASAL FRACTURE SURGERY     in highschool   . SHOULDER ARTHROSCOPY WITH OPEN ROTATOR CUFF REPAIR Right 1990's  . TRANSURETHRAL RESECTION OF PROSTATE      SOCIAL HISTORY: Social History   Tobacco Use  . Smoking status: Never Smoker  . Smokeless tobacco: Never Used  Substance Use Topics  . Alcohol use: No  . Drug use: No    FAMILY HISTORY: Family History  Problem Relation Age of Onset  . Congestive Heart Failure Mother        Related to valve disease.  Opted to treat medically  . Hypertension Mother   . Hyperlipidemia Mother   . Diabetes Mother   . Hypertension Father   . Hyperlipidemia Father   . Coronary artery disease Father 72        History of CABG  . Stroke Father   . Hypertension Brother   . Diabetes Brother   . Hypertension Daughter   . Hypertension Son   . Hypertension Brother   . Hypertension Brother     ROS: Review of Systems  Constitutional: Positive for weight loss.  Cardiovascular: Negative for chest pain and claudication.  Musculoskeletal: Negative for myalgias.  Endo/Heme/Allergies:       Negative hypoglycemia    PHYSICAL EXAM: Pt in no acute distress  RECENT LABS AND TESTS: BMET    Component Value Date/Time   NA 144 01/28/2019 1413   K 4.1 01/28/2019 1413   CL 104 01/28/2019 1413   CO2 19 (L) 01/28/2019 1413   GLUCOSE 137 (H) 01/28/2019 1413   GLUCOSE 107 (H) 12/17/2018 1004   BUN 41 (H) 01/28/2019 1413   CREATININE 1.29 (H) 01/28/2019 1413   CREATININE 1.45 (H) 12/17/2018 1004   CALCIUM 9.6 01/28/2019 1413   GFRNONAA 57 (L) 01/28/2019 1413   GFRNONAA 50 (L) 12/17/2018 1004   GFRAA 66 01/28/2019 1413   GFRAA 58 (L) 12/17/2018 1004   Lab Results  Component Value Date   HGBA1C 6.7 (H) 08/14/2017   HGBA1C 8.3 (H) 01/15/2007   No results found for: INSULIN CBC    Component Value Date/Time   WBC 6.0 01/28/2019 1413   WBC 5.8 12/17/2018 1004   RBC 4.85 01/28/2019 1413   RBC 4.81 12/17/2018 1004   HGB 15.8 01/28/2019 1413   HCT 43.0 01/28/2019 1413   PLT 256 12/17/2018 1004   MCV 89 01/28/2019 1413   MCH 32.6 01/28/2019 1413   MCH 30.1 12/17/2018 1004   MCHC 36.7 (H) 01/28/2019 1413   MCHC 34.5 12/17/2018 1004   RDW 16.8 (H) 01/28/2019 1413   LYMPHSABS 1.9 01/28/2019 1413   MONOABS 0.8 11/03/2018 1407   EOSABS 0.1 01/28/2019 1413   BASOSABS 0.0 01/28/2019 1413   Iron/TIBC/Ferritin/ %Sat No results found for: IRON, TIBC, FERRITIN, IRONPCTSAT Lipid Panel     Component Value Date/Time   CHOL 278 (H) 01/28/2019 1413   TRIG 2,296 (HH) 01/28/2019 1413   HDL 14 (L) 01/28/2019 1413   CHOLHDL 4 11/21/2018 1102   VLDL 71.8 (H) 11/21/2018 1102   LDLCALC Comment  01/28/2019 1413   LDLDIRECT 63.0 11/21/2018 1102   Hepatic Function Panel     Component Value  Date/Time   PROT 6.9 01/28/2019 1413   ALBUMIN 4.4 01/28/2019 1413   AST 49 (H) 01/28/2019 1413   ALT 43 01/28/2019 1413   ALKPHOS 80 01/28/2019 1413   BILITOT 0.3 01/28/2019 1413   BILIDIR 0.1 09/04/2008 0953      Component Value Date/Time   TSH 3.370 01/28/2019 1413   TSH 1.78 01/15/2007 0829      I, Trixie Dredge, am acting as transcriptionist for Ilene Qua, MD  I have reviewed the above documentation for accuracy and completeness, and I agree with the above. - Ilene Qua, MD

## 2019-08-02 NOTE — Progress Notes (Signed)
Office Visit Note  Patient: Jerry York             Date of Birth: 07-Sep-1951           MRN: [id number]             PCP: [name], MD Referring: [name], MD Visit Date: 08/06/2019 Occupation: @GUAROCC @  Subjective:  Lower back pain   History of Present Illness: Jerry York is a 68 y.o. male with history of gout and osteoarthritis.  He continues to take allopurinol 300 mg by mouth daily.  He has been tolerating it well by splitting up the dose and taking 200 mg in the morning and 100 mg at night.  He takes colchicine 0.6 mg 1 tablet by mouth daily.  He has not had any recent gout flares.  He has been avoiding trigger foods and beer.  He reports he has been having worsening lower back pain.  He had a MRI and was referred to Dr. Ronnald Ramp for further evaluation and treatment.  He denies any other joint pain or joint swelling at this time.  He continues to have pedal edema bilaterally.     Activities of Daily Living:  Patient reports morning stiffness for 5-10 minutes.   Patient Reports nocturnal pain.  Difficulty dressing/grooming: Denies Difficulty climbing stairs: Reports Difficulty getting out of chair: Reports Difficulty using hands for taps, buttons, cutlery, and/or writing: Denies  Review of Systems  Constitutional: Negative for fatigue and night sweats.  HENT: Positive for mouth dryness. Negative for mouth sores and nose dryness.   Eyes: Negative for redness, itching and dryness.  Respiratory: Negative for cough, hemoptysis, shortness of breath, wheezing and difficulty breathing.   Cardiovascular: Positive for swelling in legs/feet. Negative for chest pain, palpitations, hypertension and irregular heartbeat.  Gastrointestinal: Negative for abdominal pain, blood in stool, constipation and diarrhea.  Endocrine: Negative for increased urination.  Genitourinary: Negative for painful urination.  Musculoskeletal: Positive for morning stiffness. Negative for  arthralgias, joint pain, joint swelling, myalgias, muscle weakness, muscle tenderness and myalgias.  Skin: Negative for color change, rash, hair loss, nodules/bumps, redness, skin tightness, ulcers and sensitivity to sunlight.  Allergic/Immunologic: Negative for susceptible to infections.  Neurological: Negative for dizziness, fainting, headaches, memory loss and night sweats.  Hematological: Negative for bruising/bleeding tendency and swollen glands.  Psychiatric/Behavioral: Negative for depressed mood, confusion and sleep disturbance. The patient is not nervous/anxious.     PMFS History:  Patient Active Problem List   Diagnosis Date Noted  . Lower extremity edema 12/11/2017  . Family history of premature CAD 11/27/2017  . 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 Type 2 diabetes mellitus ([place]) 08/09/2017  . Gout 08/09/2017  . Anxiety 08/09/2017  . Hyperlipidemia associated with type 2 diabetes mellitus ([place]) 08/08/2017  . Hypertension associated with diabetes ([place]) 08/08/2017    Past Medical History:  Diagnosis Date  . Anxiety   . Bilateral renal cysts   . Foley catheter in place   . Gout    02-22-2017 acute right foot gout---  per pt resolved  . Gout   . Gross hematuria   . Heart murmur   . History of BPH 08/14/2017  . Hydronephrosis, left   . Hyperlipidemia   . Hypertension   . Renal insufficiency   . Rheumatoid arthritis ([place])   . Swelling   . Type 2 diabetes mellitus ([place])   . Urinary retention   . Wears glasses  Family History  Problem Relation Age of Onset  . Congestive Heart Failure Mother        Related to valve disease.  Opted to treat medically  . Hypertension Mother   . Hyperlipidemia Mother   . Diabetes Mother   . Hypertension Father   . Hyperlipidemia Father   . Coronary artery disease Father 94       History of CABG  . Stroke Father   . Hypertension Brother   . Diabetes Brother   . Hypertension Daughter   .  Hypertension Son   . Hypertension Brother   . Hypertension Brother    Past Surgical History:  Procedure Laterality Date  . APPENDECTOMY  age 71  . CYSTOSCOPY WITH URETEROSCOPY AND STENT PLACEMENT Left 05/08/2017   Procedure: URETEROSCOPY AND STENT PLACEMENT;  Surgeon: Ihor Gully, MD;  Location[facility];  Service: Urology;  Laterality: Left;  . CYSTOSCOPY/RETROGRADE/URETEROSCOPY Bilateral 05/08/2017   Procedure: CYSTOSCOPY/ BILATERAL RETROGRADE;  Surgeon: Ihor Gully, MD;  Location: [facility];  Service: Urology;  Laterality: Bilateral;  . INGUINAL HERNIA REPAIR Right 10/10/2000  . KNEE ARTHROSCOPY Right 1980's  . NASAL FRACTURE SURGERY     in highschool   . SHOULDER ARTHROSCOPY WITH OPEN ROTATOR CUFF REPAIR Right 1990's  . TRANSURETHRAL RESECTION OF PROSTATE     Social History   Social History Narrative  . Not on file   Immunization History  Administered Date(s) Administered  . Influenza, High Dose Seasonal PF 10/12/2018  . Influenza,inj,Quad PF,6+ Mos 09/26/2017  . Influenza-Unspecified 09/26/2017  . Pneumococcal Conjugate-13 11/10/2017  . Tdap 11/02/2018     Objective: Vital Signs: BP 131/72 (BP Location: Left Arm, Patient Position: Sitting, Cuff Size: Normal)   Pulse 78   Resp 14   Ht 5\' 6"  (1.676 m)   Wt 191 lb 12.8 oz (87 kg)   BMI 30.96 kg/m    Physical Exam Vitals signs and nursing note reviewed.  Constitutional:      Appearance: He is well-developed.  HENT:     Head: Normocephalic and atraumatic.  Eyes:     Conjunctiva/sclera: Conjunctivae normal.     Pupils: Pupils are equal, round, and reactive to light.  Neck:     Musculoskeletal: Normal range of motion and neck supple.  Cardiovascular:     Rate and Rhythm: Normal rate and regular rhythm.     Heart sounds: Normal heart sounds.  Pulmonary:     Effort: Pulmonary effort is normal.     Breath sounds: Normal breath sounds.  Abdominal:     General: Bowel sounds are  normal.     Palpations: Abdomen is soft.  Skin:    General: Skin is warm and dry.     Capillary Refill: Capillary refill takes less than 2 seconds.  Neurological:     Mental Status: He is alert and oriented to person, place, and time.  Psychiatric:        Behavior: Behavior normal.      Musculoskeletal Exam: C-spine limited range of motion.  He has limited range of motion of thoracic and lumbar spine with discomfort.  He has midline spinal tenderness in the lumbar region.  Shoulder joints have good range of motion with discomfort and stiffness bilaterally.  Elbow joint contractures noted bilaterally.  Wrist joints, MCPs, PIPs and DIPs good range of motion no synovitis.  He has complete fist formation bilaterally.  He has PIP and DIP synovial thickening consistent with osteoarthritis of bilateral hands.  Hip joints have limited range of motion with discomfort.  Knee joints have good range of motion with no warmth or effusion.  He has bilateral knee crepitus.  He has pedal edema bilaterally.  CDAI Exam: CDAI Score: - Patient Global: -; Provider Global: - Swollen: -; Tender: - Joint Exam   No joint exam has been documented for this visit   There is currently no information documented on the homunculus. Go to the Rheumatology activity and complete the homunculus joint exam.  Investigation: No additional findings.  Imaging: No results found.  Recent Labs: Lab Results  Component Value Date   WBC 6.0 01/28/2019   HGB 15.8 01/28/2019   PLT 256 12/17/2018   NA 144 01/28/2019   K 4.1 01/28/2019   CL 104 01/28/2019   CO2 19 (L) 01/28/2019   GLUCOSE 137 (H) 01/28/2019   BUN 41 (H) 01/28/2019   CREATININE 1.29 (H) 01/28/2019   BILITOT 0.3 01/28/2019   ALKPHOS 80 01/28/2019   AST 49 (H) 01/28/2019   ALT 43 01/28/2019   PROT 6.9 01/28/2019   ALBUMIN 4.4 01/28/2019   CALCIUM 9.6 01/28/2019   GFRAA 66 01/28/2019    Speciality Comments: No specialty comments available.   Procedures:  No procedures performed Allergies: Metformin and related and Sulfa antibiotics   Assessment / Plan:     Visit Diagnoses: Idiopathic chronic gout of multiple sites without tophus -He has not had any recent gout flares.  He is clinically doing well on allopurinol 300 mg by mouth daily and colchicine 0.6 mg 1 tablet by mouth daily.  He has been tolerating allopurinol without experiencing any diarrhea since he has started splitting the dose to 200 mg in the morning and 100 mg in the evening.  He has been trying to avoid trigger foods such as shellfish and red meat.  His uric acid level was 6.8 on 12/17/2018.  We will check uric acid level today.  CBC and CMP will also be checked today to monitor for drug toxicity.  A refill of allopurinol and colchicine were sent to the pharmacy.  He was advised to notify us if he develops signs or symptoms of a gout flare.  He will follow-up in the office in 6 months.  - Plan: allopurinol (ZYLOPRIM) 100 MG tablet, colchicine 0.6 MG tablet, Uric acid  Hyperuricemia -Uric acid was 6.8 on 12/17/18.  We will check uric acid level today. Plan: Uric acid  Medication management - CBC, CMP, and uric acid level will be checked today. Plan: Uric acid, CBC with Differential/Platelet, COMPLETE METABOLIC PANEL WITH GFR  Primary osteoarthritis of right knee - Severe lateral compartment narrowing and moderate chondromalacia patella: He has good range of motion with no discomfort.  No warmth or effusion was noted.  He has right knee crepitus on exam.  Primary osteoarthritis of right hip -He has limited ROM of the right hip joint on exam.  He has no discomfort at this time.   Chronic midline low back pain without sciatica: He has chronic pain in his lower back.  He had an MRI recently, but we do not have the results in Epic.  He was referred to Dr. Yetta BarreJones for further evaluation and treatment.  Other medical conditions are listed as follows:   Essential hypertension    History of diabetes mellitus   Heart murmur   History of anxiety   History of BPH  Hyperlipidemia associated with type 2 diabetes mellitus (HCC)  Orders: Orders Placed This Encounter  Procedures  . Uric acid  . CBC with Differential/Platelet  . COMPLETE METABOLIC PANEL WITH GFR   Meds ordered this encounter  Medications  . allopurinol (ZYLOPRIM) 100 MG tablet    Sig: Take 3 tablets (300 mg total) by mouth daily.    Dispense:  90 tablet    Refill:  2  . colchicine 0.6 MG tablet    Sig: TAKE 1 TABLET BY MOUTH DAILY. TAKE 2 TIMES A DAY IF EXPERIENCING A FLARE    Dispense:  90 tablet    Refill:  0      Follow-Up Instructions: Return in about 6 months (around 02/06/2020) for Gout, Osteoarthritis.   Gearldine Bienenstock, PA-C   I examined and evaluated the patient with Sherron Ales PA.  Patient is clinically doing well on allopurinol.  He has not had any gout flare.  If his uric acid stays below 6 and he does not have any gout flares he may discontinue colchicine in the future.  He had no synovitis on my examination.  The plan of care was discussed as noted above.  Pollyann Savoy, MD  Note - This record has been created using Animal nutritionist.  Chart creation errors have been sought, but may not always  have been located. Such creation errors do not reflect on  the standard of medical care.

## 2019-08-06 ENCOUNTER — Encounter: Payer: Self-pay | Admitting: Rheumatology

## 2019-08-06 ENCOUNTER — Other Ambulatory Visit: Payer: Self-pay | Admitting: Family Medicine

## 2019-08-06 ENCOUNTER — Ambulatory Visit (INDEPENDENT_AMBULATORY_CARE_PROVIDER_SITE_OTHER): Payer: PPO | Admitting: Rheumatology

## 2019-08-06 ENCOUNTER — Other Ambulatory Visit: Payer: Self-pay

## 2019-08-06 VITALS — BP 131/72 | HR 78 | Resp 14 | Ht 66.0 in | Wt 191.8 lb

## 2019-08-06 DIAGNOSIS — M1711 Unilateral primary osteoarthritis, right knee: Secondary | ICD-10-CM | POA: Diagnosis not present

## 2019-08-06 DIAGNOSIS — Z8659 Personal history of other mental and behavioral disorders: Secondary | ICD-10-CM

## 2019-08-06 DIAGNOSIS — G8929 Other chronic pain: Secondary | ICD-10-CM

## 2019-08-06 DIAGNOSIS — Z87438 Personal history of other diseases of male genital organs: Secondary | ICD-10-CM

## 2019-08-06 DIAGNOSIS — M1A09X0 Idiopathic chronic gout, multiple sites, without tophus (tophi): Secondary | ICD-10-CM

## 2019-08-06 DIAGNOSIS — I10 Essential (primary) hypertension: Secondary | ICD-10-CM | POA: Diagnosis not present

## 2019-08-06 DIAGNOSIS — M1611 Unilateral primary osteoarthritis, right hip: Secondary | ICD-10-CM | POA: Diagnosis not present

## 2019-08-06 DIAGNOSIS — Z79899 Other long term (current) drug therapy: Secondary | ICD-10-CM

## 2019-08-06 DIAGNOSIS — R011 Cardiac murmur, unspecified: Secondary | ICD-10-CM

## 2019-08-06 DIAGNOSIS — M545 Low back pain, unspecified: Secondary | ICD-10-CM

## 2019-08-06 DIAGNOSIS — Z8639 Personal history of other endocrine, nutritional and metabolic disease: Secondary | ICD-10-CM | POA: Diagnosis not present

## 2019-08-06 DIAGNOSIS — E790 Hyperuricemia without signs of inflammatory arthritis and tophaceous disease: Secondary | ICD-10-CM | POA: Diagnosis not present

## 2019-08-06 DIAGNOSIS — E785 Hyperlipidemia, unspecified: Secondary | ICD-10-CM

## 2019-08-06 DIAGNOSIS — E1169 Type 2 diabetes mellitus with other specified complication: Secondary | ICD-10-CM | POA: Diagnosis not present

## 2019-08-06 MED ORDER — COLCHICINE 0.6 MG PO TABS: ORAL_TABLET | ORAL | 0 refills | Status: DC

## 2019-08-06 MED ORDER — ALLOPURINOL 100 MG PO TABS: 300.0000 mg | ORAL_TABLET | Freq: Every day | ORAL | 2 refills | Status: DC

## 2019-08-07 LAB — COMPLETE METABOLIC PANEL WITH GFR
AG Ratio: 1.6 (calc) (ref 1.0–2.5)
ALT: 58 U/L — ABNORMAL HIGH (ref 9–46)
AST: 46 U/L — ABNORMAL HIGH (ref 10–35)
Albumin: 3.9 g/dL (ref 3.6–5.1)
Alkaline phosphatase (APISO): 91 U/L (ref 35–144)
BUN/Creatinine Ratio: 23 (calc) — ABNORMAL HIGH (ref 6–22)
BUN: 36 mg/dL — ABNORMAL HIGH (ref 7–25)
CO2: 28 mmol/L (ref 20–32)
Calcium: 9.5 mg/dL (ref 8.6–10.3)
Chloride: 103 mmol/L (ref 98–110)
Creat: 1.57 mg/dL — ABNORMAL HIGH (ref 0.70–1.25)
GFR, Est African American: 52 mL/min/{1.73_m2} — ABNORMAL LOW (ref >=60)
GFR, Est Non African American: 45 mL/min/{1.73_m2} — ABNORMAL LOW (ref >=60)
Globulin: 2.4 g/dL (calc) (ref 1.9–3.7)
Glucose, Bld: 247 mg/dL — ABNORMAL HIGH (ref 65–99)
Potassium: 3.8 mmol/L (ref 3.5–5.3)
Sodium: 143 mmol/L (ref 135–146)
Total Bilirubin: 0.5 mg/dL (ref 0.2–1.2)
Total Protein: 6.3 g/dL (ref 6.1–8.1)

## 2019-08-07 LAB — CBC WITH DIFFERENTIAL/PLATELET
Absolute Monocytes: 246 cells/uL (ref 200–950)
Basophils Absolute: 12 cells/uL (ref 0–200)
Basophils Relative: 0.3 %
Eosinophils Absolute: 0 cells/uL — ABNORMAL LOW (ref 15–500)
Eosinophils Relative: 0 %
HCT: 41.2 % (ref 38.5–50.0)
Hemoglobin: 14.7 g/dL (ref 13.2–17.1)
Lymphs Abs: 1365 cells/uL (ref 850–3900)
MCH: 30.5 pg (ref 27.0–33.0)
MCHC: 35.7 g/dL (ref 32.0–36.0)
MCV: 85.5 fL (ref 80.0–100.0)
MPV: 11 fL (ref 7.5–12.5)
Monocytes Relative: 6.3 %
Neutro Abs: 2278 cells/uL (ref 1500–7800)
Neutrophils Relative %: 58.4 %
Platelets: 121 10*3/uL — ABNORMAL LOW (ref 140–400)
RBC: 4.82 10*6/uL (ref 4.20–5.80)
RDW: 15.5 % — ABNORMAL HIGH (ref 11.0–15.0)
Total Lymphocyte: 35 %
WBC: 3.9 10*3/uL (ref 3.8–10.8)

## 2019-08-07 LAB — URIC ACID: Uric Acid, Serum: 5.9 mg/dL (ref 4.0–8.0)

## 2019-08-07 NOTE — Progress Notes (Signed)
Uric acid is within desirable range.  Plts are low at 121.  Please notify patient and forward labs to PCP.   Glucose is elevated-247.  Creatinine is elevated and trending up.  GFR is 45.   LFTs are elevated.  Please advise patient to avoid NSAIDs, tylenol, and alcohol.  Please forward labs to PCP.  Reviewed labs with PCP.

## 2019-08-09 DIAGNOSIS — Z794 Long term (current) use of insulin: Secondary | ICD-10-CM | POA: Diagnosis not present

## 2019-08-09 DIAGNOSIS — E785 Hyperlipidemia, unspecified: Secondary | ICD-10-CM | POA: Diagnosis not present

## 2019-08-09 DIAGNOSIS — Z79899 Other long term (current) drug therapy: Secondary | ICD-10-CM | POA: Diagnosis not present

## 2019-08-09 DIAGNOSIS — E1165 Type 2 diabetes mellitus with hyperglycemia: Secondary | ICD-10-CM | POA: Diagnosis not present

## 2019-08-09 LAB — HEMOGLOBIN A1C: Hemoglobin A1C: 8.1

## 2019-08-12 ENCOUNTER — Other Ambulatory Visit: Payer: Self-pay

## 2019-08-12 ENCOUNTER — Ambulatory Visit (INDEPENDENT_AMBULATORY_CARE_PROVIDER_SITE_OTHER): Payer: PPO | Admitting: Family Medicine

## 2019-08-13 DIAGNOSIS — M48062 Spinal stenosis, lumbar region with neurogenic claudication: Secondary | ICD-10-CM | POA: Diagnosis not present

## 2019-08-14 ENCOUNTER — Other Ambulatory Visit: Payer: Self-pay | Admitting: Neurological Surgery

## 2019-08-15 ENCOUNTER — Other Ambulatory Visit: Payer: Self-pay

## 2019-08-15 ENCOUNTER — Telehealth (INDEPENDENT_AMBULATORY_CARE_PROVIDER_SITE_OTHER): Payer: PPO | Admitting: Family Medicine

## 2019-08-15 ENCOUNTER — Encounter (INDEPENDENT_AMBULATORY_CARE_PROVIDER_SITE_OTHER): Payer: Self-pay | Admitting: Family Medicine

## 2019-08-15 DIAGNOSIS — E1165 Type 2 diabetes mellitus with hyperglycemia: Secondary | ICD-10-CM | POA: Diagnosis not present

## 2019-08-15 DIAGNOSIS — E669 Obesity, unspecified: Secondary | ICD-10-CM | POA: Diagnosis not present

## 2019-08-15 DIAGNOSIS — R7989 Other specified abnormal findings of blood chemistry: Secondary | ICD-10-CM | POA: Diagnosis not present

## 2019-08-15 DIAGNOSIS — Z6832 Body mass index (BMI) 32.0-32.9, adult: Secondary | ICD-10-CM | POA: Diagnosis not present

## 2019-08-15 DIAGNOSIS — Z794 Long term (current) use of insulin: Secondary | ICD-10-CM | POA: Diagnosis not present

## 2019-08-15 DIAGNOSIS — E66811 Obesity, class 1: Secondary | ICD-10-CM

## 2019-08-20 NOTE — Progress Notes (Signed)
Office: [phone]  /  Fax: [phone] TeleHealth Visit:  Jerry York has verbally consented to this TeleHealth visit today. The patient is located at home, the provider is located at the [facility] office. The participants in this visit include the listed provider and patient. The visit was conducted today via face time.  HPI:   Chief Complaint: OBESITY Wake is here to discuss his progress with his obesity treatment plan. He is on the Category 3 plan and is following his eating plan approximately 70 % of the time. He states he is exercising 0 minutes 0 times per week. Jerry York has been trying to lose weight so he was decreasing the amount of food on the plan. He says he has been often getting close to 4 oz meat at lunch, but only getting in 4-6 oz at dinner. His weight this morning was 189 lbs, and his blood pressure was 131/72. We were unable to weigh the patient today for this TeleHealth visit. He feels as if he has gained 2 lbs since his last visit. He has lost 0 lbs since starting treatment with Jerry York.  Diabetes II with Hyperglycemia Tag has a diagnosis of diabetes type II. Jerry York just changed to Humulin R 500 80 units TID. Last Hgb A1c was of 8.1 on 08/09/2019. He states his blood sugars range in 200's, blood sugar was 206 this morning, but he tried to stop eating to lose weight. He denies hypoglycemia. He has been working on intensive lifestyle modifications including diet, exercise, and weight loss to help control his blood glucose levels.  Elevated Creatinine [name]'s recent Cr was at 1.57. He has an appointment with his primary care physician on September 11th.  ASSESSMENT AND PLAN:  Type 2 diabetes mellitus with hyperglycemia, with long-term current use of insulin (HCC)  Elevated serum creatinine  Class 1 obesity with serious comorbidity and body mass index (BMI) of 32.0 to 32.9 in adult, unspecified obesity type  PLAN:  Diabetes II with  Hyperglycemia Jahmari has been given extensive diabetes education by myself today including ideal fasting and post-prandial blood glucose readings, individual ideal Hgb A1c goals and hypoglycemia prevention. We discussed the importance of good blood sugar control to decrease the likelihood of diabetic complications such as nephropathy, neuropathy, limb loss, blindness, coronary artery disease, and death. We discussed the importance of intensive lifestyle modification including diet, exercise and weight loss as the first line treatment for diabetes. Abdulkarim agrees to continue his diabetes medications, and continue checking his blood sugars at home. We will check C-peptide at his next appointment. Melissa agrees to follow up with our clinic in 2 weeks.  Elevated Creatinine We will repeat labs at his next appointment. [place] agrees to follow up with our clinic in 2 weeks.  Obesity Jerry York is currently in the action stage of change. As such, his goal is to continue with weight loss efforts He has agreed to follow the Category 3 plan Jerry York has been instructed to work up to a goal of 150 minutes of combined cardio and strengthening exercise per week for weight loss and overall health benefits. We discussed the following Behavioral Modification Strategies today: increasing lean protein intake, increasing vegetables and work on meal planning and easy cooking plans, keeping healthy foods in the home, and planning for success   Jerry York has agreed to follow up with our clinic in 2 weeks. He was informed of the importance of frequent follow up visits to maximize his success with intensive lifestyle  modifications for his multiple health conditions.  ALLERGIES: Allergies  Allergen Reactions   Metformin And Related Diarrhea   Sulfa Antibiotics Other (See Comments)    "make my feet burn"    MEDICATIONS: Current Outpatient Medications on File Prior to Visit  Medication Sig Dispense Refill   allopurinol  (ZYLOPRIM) 100 MG tablet Take 3 tablets (300 mg total) by mouth daily. 90 tablet 2   amLODipine (NORVASC) 5 MG tablet Take 1 tablet (5 mg total) by mouth daily. 90 tablet 1   atorvastatin (LIPITOR) 20 MG tablet TAKE ONE TABLET BY MOUTH EVERY EVENING 90 tablet 0   [name] Take 5,000 mg by mouth 2 (two) times daily.      clonazePAM (KLONOPIN) 0.5 MG tablet Take 1 tablet (0.5 mg total) by mouth 2 (two) times daily. 180 tablet 0   colchicine 0.6 MG tablet TAKE 1 TABLET BY MOUTH DAILY. TAKE 2 TIMES A DAY IF EXPERIENCING A FLARE 90 tablet 0   Continuous Blood Gluc Sensor ([name] 14 DAY SENSOR) MISC Apply 1 kit topically every 14 (fourteen) days. 6 each 3   furosemide (LASIX) 80 MG tablet Take 1 tablet (80 mg total) by mouth 2 (two) times daily. 180 tablet 3   glucose blood ([name] TEST STRIPS) test strip Check blood sugars 2-3 times per day. DX: E11.9 300 each 2   glucose monitoring kit ([name]) monitoring kit 1 each by Does not apply route as needed for other. DX Code: E11.9 1 each 0   insulin regular (NOVOLIN R) 100 units/mL injection Inject 80 Units into the skin 3 (three) times daily before meals.     metoprolol tartrate (LOPRESSOR) 100 MG tablet Take 1 tablet (100 mg total) by mouth 2 (two) times daily. 180 tablet 3   traZODone (DESYREL) 50 MG tablet Take 3 tablets (150 mg total) by mouth at bedtime as needed for sleep. 180 tablet 3   No current facility-administered medications on file prior to visit.     PAST MEDICAL HISTORY: Past Medical History:  Diagnosis Date   Anxiety    Bilateral renal cysts    Foley catheter in place    Gout    02-22-2017 acute right foot gout---  per pt resolved   Gout    Gross hematuria    Heart murmur    History of BPH 08/14/2017   Hydronephrosis, left    Hyperlipidemia    Hypertension    Renal insufficiency    Rheumatoid arthritis ([place])    Swelling    Type 2 diabetes mellitus ([place])    Urinary retention     Wears glasses     PAST SURGICAL HISTORY: Past Surgical History:  Procedure Laterality Date   APPENDECTOMY  age 49   CYSTOSCOPY WITH URETEROSCOPY AND STENT PLACEMENT Left 05/08/2017   Procedure: URETEROSCOPY AND STENT PLACEMENT;  Surgeon: Kathie Rhodes, MD;  Location: [facility];  Service: Urology;  Laterality: Left;   CYSTOSCOPY/RETROGRADE/URETEROSCOPY Bilateral 05/08/2017   Procedure: CYSTOSCOPY/ BILATERAL RETROGRADE;  Surgeon: Kathie Rhodes, MD;  Location[facility];  Service: Urology;  Laterality: Bilateral;   INGUINAL HERNIA REPAIR Right 10/10/2000   KNEE ARTHROSCOPY Right 1980's   NASAL FRACTURE SURGERY     in highschool    SHOULDER ARTHROSCOPY WITH OPEN ROTATOR CUFF REPAIR Right 1990's   TRANSURETHRAL RESECTION OF PROSTATE      SOCIAL HISTORY: Social History   Tobacco Use   Smoking status: Never Smoker   Smokeless tobacco: Never Used  Substance Use Topics   Alcohol use: No   Drug use: No    FAMILY HISTORY: Family History  Problem Relation Age of Onset   Congestive Heart Failure Mother        Related to valve disease.  Opted to treat medically   Hypertension Mother    Hyperlipidemia Mother    Diabetes Mother    Hypertension Father    Hyperlipidemia Father    Coronary artery disease Father 9       History of CABG   Stroke Father    Hypertension Brother    Diabetes Brother    Hypertension Daughter    Hypertension Son    Hypertension Brother    Hypertension Brother     ROS: Review of Systems  Constitutional: Negative for weight loss.  Endo/Heme/Allergies:       Negative hypoglycemia    PHYSICAL EXAM: Pt in no acute distress  RECENT LABS AND TESTS: BMET    Component Value Date/Time   NA 143 08/06/2019 1531   NA 144 01/28/2019 1413   K 3.8 08/06/2019 1531   CL 103 08/06/2019 1531   CO2 28 08/06/2019 1531   GLUCOSE 247 (H) 08/06/2019 1531   BUN 36 (H) 08/06/2019 1531   BUN 41 (H)  01/28/2019 1413   CREATININE 1.57 (H) 08/06/2019 1531   CALCIUM 9.5 08/06/2019 1531   GFRNONAA 45 (L) 08/06/2019 1531   GFRAA 52 (L) 08/06/2019 1531   Lab Results  Component Value Date   HGBA1C 6.7 (H) 08/14/2017   HGBA1C 8.3 (H) 01/15/2007   No results found for: INSULIN CBC    Component Value Date/Time   WBC 3.9 08/06/2019 1531   RBC 4.82 08/06/2019 1531   HGB 14.7 08/06/2019 1531   HGB 15.8 01/28/2019 1413   HCT 41.2 08/06/2019 1531   HCT 43.0 01/28/2019 1413   PLT 121 (L) 08/06/2019 1531   MCV 85.5 08/06/2019 1531   MCV 89 01/28/2019 1413   MCH 30.5 08/06/2019 1531   MCHC 35.7 08/06/2019 1531   RDW 15.5 (H) 08/06/2019 1531   RDW 16.8 (H) 01/28/2019 1413   LYMPHSABS 1,365 08/06/2019 1531   LYMPHSABS 1.9 01/28/2019 1413   MONOABS 0.8 11/03/2018 1407   EOSABS 0 (L) 08/06/2019 1531   EOSABS 0.1 01/28/2019 1413   BASOSABS 12 08/06/2019 1531   BASOSABS 0.0 01/28/2019 1413   Iron/TIBC/Ferritin/ %Sat No results found for: IRON, TIBC, FERRITIN, IRONPCTSAT Lipid Panel     Component Value Date/Time   CHOL 278 (H) 01/28/2019 1413   TRIG 2,296 (HH) 01/28/2019 1413   HDL 14 (L) 01/28/2019 1413   CHOLHDL 4 11/21/2018 1102   VLDL 71.8 (H) 11/21/2018 1102   LDLCALC Comment 01/28/2019 1413   LDLDIRECT 63.0 11/21/2018 1102   Hepatic Function Panel     Component Value Date/Time   PROT 6.3 08/06/2019 1531   PROT 6.9 01/28/2019 1413   ALBUMIN 4.4 01/28/2019 1413   AST 46 (H) 08/06/2019 1531   ALT 58 (H) 08/06/2019 1531   ALKPHOS 80 01/28/2019 1413   BILITOT 0.5 08/06/2019 1531   BILITOT 0.3 01/28/2019 1413   BILIDIR 0.1 09/04/2008 0953      Component Value Date/Time   TSH 3.370 01/28/2019 1413   TSH 1.78 01/15/2007 0829      I, Trixie Dredge, am acting as transcriptionist for Ilene Qua, MD   I have reviewed the above documentation for accuracy and completeness, and I agree with the above. - Ilene Qua, MD

## 2019-08-21 ENCOUNTER — Encounter: Payer: Self-pay | Admitting: Family Medicine

## 2019-08-29 ENCOUNTER — Encounter (INDEPENDENT_AMBULATORY_CARE_PROVIDER_SITE_OTHER): Payer: Self-pay | Admitting: Family Medicine

## 2019-08-29 ENCOUNTER — Ambulatory Visit (INDEPENDENT_AMBULATORY_CARE_PROVIDER_SITE_OTHER): Payer: PPO | Admitting: Family Medicine

## 2019-08-29 ENCOUNTER — Other Ambulatory Visit: Payer: Self-pay

## 2019-08-29 ENCOUNTER — Encounter: Payer: Self-pay | Admitting: Family Medicine

## 2019-08-29 VITALS — BP 144/68 | HR 65 | Temp 97.6°F | Ht 65.0 in | Wt 186.0 lb

## 2019-08-29 DIAGNOSIS — E669 Obesity, unspecified: Secondary | ICD-10-CM | POA: Diagnosis not present

## 2019-08-29 DIAGNOSIS — Z9189 Other specified personal risk factors, not elsewhere classified: Secondary | ICD-10-CM

## 2019-08-29 DIAGNOSIS — R0602 Shortness of breath: Secondary | ICD-10-CM

## 2019-08-29 DIAGNOSIS — E559 Vitamin D deficiency, unspecified: Secondary | ICD-10-CM | POA: Diagnosis not present

## 2019-08-29 DIAGNOSIS — E1165 Type 2 diabetes mellitus with hyperglycemia: Secondary | ICD-10-CM

## 2019-08-29 DIAGNOSIS — Z794 Long term (current) use of insulin: Secondary | ICD-10-CM | POA: Diagnosis not present

## 2019-08-29 DIAGNOSIS — Z6831 Body mass index (BMI) 31.0-31.9, adult: Secondary | ICD-10-CM | POA: Diagnosis not present

## 2019-08-30 LAB — LIPID PANEL WITH LDL/HDL RATIO
Cholesterol, Total: 236 mg/dL — ABNORMAL HIGH (ref 100–199)
HDL: 18 mg/dL — ABNORMAL LOW (ref >39)
Triglycerides: 1274 mg/dL (ref 0–149)

## 2019-08-30 LAB — COMPREHENSIVE METABOLIC PANEL
ALT: 64 IU/L — ABNORMAL HIGH (ref 0–44)
AST: 58 IU/L — ABNORMAL HIGH (ref 0–40)
Albumin/Globulin Ratio: 1.6 (ref 1.2–2.2)
Albumin: 4 g/dL (ref 3.8–4.8)
Alkaline Phosphatase: 77 IU/L (ref 39–117)
BUN/Creatinine Ratio: 25 — ABNORMAL HIGH (ref 10–24)
BUN: 39 mg/dL — ABNORMAL HIGH (ref 8–27)
Bilirubin Total: 0.3 mg/dL (ref 0.0–1.2)
CO2: 24 mmol/L (ref 20–29)
Calcium: 9.4 mg/dL (ref 8.6–10.2)
Chloride: 100 mmol/L (ref 96–106)
Creatinine, Ser: 1.56 mg/dL — ABNORMAL HIGH (ref 0.76–1.27)
GFR calc Af Amer: 52 mL/min/{1.73_m2} — ABNORMAL LOW (ref >59)
GFR calc non Af Amer: 45 mL/min/{1.73_m2} — ABNORMAL LOW (ref >59)
Globulin, Total: 2.5 g/dL (ref 1.5–4.5)
Glucose: 197 mg/dL — ABNORMAL HIGH (ref 65–99)
Potassium: 4.4 mmol/L (ref 3.5–5.2)
Sodium: 142 mmol/L (ref 134–144)
Total Protein: 6.5 g/dL (ref 6.0–8.5)

## 2019-08-30 LAB — C-PEPTIDE: C-Peptide: 9.8 ng/mL — ABNORMAL HIGH (ref 1.1–4.4)

## 2019-08-30 LAB — VITAMIN D 25 HYDROXY (VIT D DEFICIENCY, FRACTURES)

## 2019-09-04 NOTE — Progress Notes (Signed)
Office: [phone]  /  Fax: [phone]   HPI:   Chief Complaint: OBESITY Jerry York is here to discuss his progress with his obesity treatment plan. He is on the Category 3 plan and is following his eating plan approximately 90 % of the time. He states he is exercising 0 minutes 0 times per week. Jerry York is trying to get his A1c down for his upcoming surgery. Last A1c was 8.1 on 08/09/2019. He is trying to lower A1c by decreasing or cutting out carbohydrates. He is measuring his meat and getting in around 8 oz of meat at dinner. His surgery is scheduled for 09/16/2019.  His weight is 186 lb (84.4 kg) today and has gained 1 lb since his last visit. He has lost 0 lbs since starting treatment with [name].  Shortness of Breath with Exertion Jerry York notes increasing shortness of breath with exercising, and symptoms are the same since initial appointment. He is not on any inhalers. This has not gotten worse recently. Hoyt denies shortness of breath at rest or orthopnea.  Diabetes II with Hyperglycemia Jerry York has a diagnosis of diabetes type II. Clent states his fasting BGs averaged at 123 this morning. His BGs have been ranging between 150 and 180's (not fasting). He had a hypoglycemic episode of 56 (he reports likely secondary to length of time between eating). He is doing 100,90,90 units of Novolin R. Last A1c was 8.1. He has been working on intensive lifestyle modifications including diet, exercise, and weight loss to help control his blood glucose levels.  Vitamin D Deficiency Jerry York has a diagnosis of vitamin D deficiency. He is not currently taking Vit D. He notes fatigue and denies nausea, vomiting or muscle weakness.  At risk for osteopenia and osteoporosis Jerry York is at higher risk of osteopenia and osteoporosis due to vitamin D deficiency.   ASSESSMENT AND PLAN:  Shortness of breath on exertion - Plan: Lipid Panel With LDL/HDL Ratio  Type 2 diabetes mellitus with hyperglycemia, with  long-term current use of insulin (HCC) - Plan: C-peptide, Comprehensive metabolic panel  Vitamin D deficiency - Plan: VITAMIN D 25 Hydroxy (Vit-D Deficiency, Fractures)  At risk for osteoporosis  Class 1 obesity with serious comorbidity and body mass index (BMI) of 31.0 to 31.9 in adult, unspecified obesity type  PLAN:  Shortness of Breath with Exertion Jerry York's shortness of breath appears to be obesity related and exercise induced. The indirect calorimeter results showed VO2 of 236 and a REE of 1642. He has agreed to work on weight loss and gradually increase exercise to treat his exercise induced shortness of breath. If Vash follows our instructions and loses weight without improvement of his shortness of breath, we will plan to refer to pulmonology. Jerry York agrees to this plan.  Diabetes II with Hyperglycemia Jerry York has been given extensive diabetes education by myself today including ideal fasting and post-prandial blood glucose readings, individual ideal Hgb A1c goals and hypoglycemia prevention. We discussed the importance of good blood sugar control to decrease the likelihood of diabetic complications such as nephropathy, neuropathy, limb loss, blindness, coronary artery disease, and death. We discussed the importance of intensive lifestyle modification including diet, exercise and weight loss as the first line treatment for diabetes. Jerry York agrees to continue his diabetes medications, and we will check C-peptide, CMP, and FLP today. Jerry York agrees to follow up with our clinic in 2 to 3 weeks.  Vitamin D Deficiency Jerry York was informed that low vitamin D levels contributes to fatigue and are associated with  obesity, breast, and colon cancer. He will follow up for routine testing of vitamin D, at least 2-3 times per year. He was informed of the risk of over-replacement of vitamin D and agrees to not increase his dose unless he discusses this with [name] first. We will check Vit D level today.  Adalberto agrees to follow up with our clinic in 2 to 3 weeks.  At risk for osteopenia and osteoporosis Daniel was given extended (15 minutes) osteoporosis prevention counseling today. Jerry York is at risk for osteopenia and osteoporsis due to his vitamin D deficiency. He was encouraged to take his vitamin D and follow his higher calcium diet and increase strengthening exercise to help strengthen his bones and decrease his risk of osteopenia and osteoporosis.  Obesity Jerry York is currently in the action stage of change. As such, his goal is to continue with weight loss efforts He has agreed to follow the Category 2 plan + 100 calories Jerry York has been instructed to work up to a goal of 150 minutes of combined cardio and strengthening exercise per week for weight loss and overall health benefits. We discussed the following Behavioral Modification Strategies today: increasing lean protein intake, increasing vegetables and work on meal planning and easy cooking plans, keeping healthy foods in the home, and planning for success   Jerry York has agreed to follow up with our clinic in 2 to 3 weeks. He was informed of the importance of frequent follow up visits to maximize his success with intensive lifestyle modifications for his multiple health conditions.  ALLERGIES: Allergies  Allergen Reactions   Metformin And Related Diarrhea   Sulfa Antibiotics Other (See Comments)    "make my feet burn"    MEDICATIONS: Current Outpatient Medications on File Prior to Visit  Medication Sig Dispense Refill   allopurinol (ZYLOPRIM) 100 MG tablet Take 3 tablets (300 mg total) by mouth daily. 90 tablet 2   amLODipine (NORVASC) 5 MG tablet Take 1 tablet (5 mg total) by mouth daily. 90 tablet 1   atorvastatin (LIPITOR) 20 MG tablet TAKE ONE TABLET BY MOUTH EVERY EVENING 90 tablet 0   [name] Take 5,000 mg by mouth 2 (two) times daily.      clonazePAM (KLONOPIN) 0.5 MG tablet Take 1 tablet (0.5 mg total) by  mouth 2 (two) times daily. 180 tablet 0   colchicine 0.6 MG tablet TAKE 1 TABLET BY MOUTH DAILY. TAKE 2 TIMES A DAY IF EXPERIENCING A FLARE 90 tablet 0   Continuous Blood Gluc Sensor ([name] 14 DAY SENSOR) MISC Apply 1 kit topically every 14 (fourteen) days. 6 each 3   furosemide (LASIX) 80 MG tablet Take 1 tablet (80 mg total) by mouth 2 (two) times daily. 180 tablet 3   glucose blood ([name] TEST STRIPS) test strip Check blood sugars 2-3 times per day. DX: E11.9 300 each 2   glucose monitoring kit ([name]) monitoring kit 1 each by Does not apply route as needed for other. DX Code: E11.9 1 each 0   insulin regular (NOVOLIN R) 100 units/mL injection Inject 80 Units into the skin 3 (three) times daily before meals.     metoprolol tartrate (LOPRESSOR) 100 MG tablet Take 1 tablet (100 mg total) by mouth 2 (two) times daily. 180 tablet 3   traZODone (DESYREL) 50 MG tablet Take 3 tablets (150 mg total) by mouth at bedtime as needed for sleep. 180 tablet 3   No current facility-administered medications on file prior to visit.  PAST MEDICAL HISTORY: Past Medical History:  Diagnosis Date   Anxiety    Bilateral renal cysts    Foley catheter in place    Gout    02-22-2017 acute right foot gout---  per pt resolved   Gout    Gross hematuria    Heart murmur    History of BPH 08/14/2017   Hydronephrosis, left    Hyperlipidemia    Hypertension    Renal insufficiency    Rheumatoid arthritis ([place])    Swelling    Type 2 diabetes mellitus ([place])    Urinary retention    Wears glasses     PAST SURGICAL HISTORY: Past Surgical History:  Procedure Laterality Date   APPENDECTOMY  age 65   CYSTOSCOPY WITH URETEROSCOPY AND STENT PLACEMENT Left 05/08/2017   Procedure: URETEROSCOPY AND STENT PLACEMENT;  Surgeon: Kathie Rhodes, MD;  Location: [facility];  Service: Urology;  Laterality: Left;   CYSTOSCOPY/RETROGRADE/URETEROSCOPY Bilateral  05/08/2017   Procedure: CYSTOSCOPY/ BILATERAL RETROGRADE;  Surgeon: Kathie Rhodes, MD;  Location: [facility];  Service: Urology;  Laterality: Bilateral;   INGUINAL HERNIA REPAIR Right 10/10/2000   KNEE ARTHROSCOPY Right 1980's   NASAL FRACTURE SURGERY     in highschool    SHOULDER ARTHROSCOPY WITH OPEN ROTATOR CUFF REPAIR Right 1990's   TRANSURETHRAL RESECTION OF PROSTATE      SOCIAL HISTORY: Social History   Tobacco Use   Smoking status: Never Smoker   Smokeless tobacco: Never Used  Substance Use Topics   Alcohol use: No   Drug use: No    FAMILY HISTORY: Family History  Problem Relation Age of Onset   Congestive Heart Failure Mother        Related to valve disease.  Opted to treat medically   Hypertension Mother    Hyperlipidemia Mother    Diabetes Mother    Hypertension Father    Hyperlipidemia Father    Coronary artery disease Father 60       History of CABG   Stroke Father    Hypertension Brother    Diabetes Brother    Hypertension Daughter    Hypertension Son    Hypertension Brother    Hypertension Brother     ROS: Review of Systems  Constitutional: Positive for malaise/fatigue. Negative for weight loss.  Respiratory: Positive for shortness of breath (with exertion).   Cardiovascular: Negative for orthopnea.  Gastrointestinal: Negative for nausea and vomiting.  Musculoskeletal:       Negative muscle weakness  Endo/Heme/Allergies:       Negative hypoglycemia    PHYSICAL EXAM: Blood pressure (!) 144/68, pulse 65, temperature 97.6 F (36.4 C), temperature source Oral, height _0  (1.651 m), weight 186 lb (84.4 kg), SpO2 94 %. Body mass index is 30.95 kg/m. Physical Exam Vitals signs reviewed.  Constitutional:      Appearance: Normal appearance. He is obese.  Cardiovascular:     Rate and Rhythm: Normal rate.     Pulses: Normal pulses.  Pulmonary:     Effort: Pulmonary effort is normal.     Breath sounds:  Normal breath sounds.  Musculoskeletal: Normal range of motion.  Skin:    General: Skin is warm and dry.  Neurological:     Mental Status: He is alert and oriented to person, place, and time.  Psychiatric:        Mood and Affect: Mood normal.        Behavior: Behavior normal.     RECENT LABS  AND TESTS: BMET    Component Value Date/Time   NA 142 08/29/2019 1105   K 4.4 08/29/2019 1105   CL 100 08/29/2019 1105   CO2 24 08/29/2019 1105   GLUCOSE 197 (H) 08/29/2019 1105   GLUCOSE 247 (H) 08/06/2019 1531   BUN 39 (H) 08/29/2019 1105   CREATININE 1.56 (H) 08/29/2019 1105   CREATININE 1.57 (H) 08/06/2019 1531   CALCIUM 9.4 08/29/2019 1105   GFRNONAA 45 (L) 08/29/2019 1105   GFRNONAA 45 (L) 08/06/2019 1531   GFRAA 52 (L) 08/29/2019 1105   GFRAA 52 (L) 08/06/2019 1531   Lab Results  Component Value Date   HGBA1C 8.1 08/09/2019   HGBA1C 6.7 (H) 08/14/2017   HGBA1C 8.3 (H) 01/15/2007   No results found for: INSULIN CBC    Component Value Date/Time   WBC 3.9 08/06/2019 1531   RBC 4.82 08/06/2019 1531   HGB 14.7 08/06/2019 1531   HGB 15.8 01/28/2019 1413   HCT 41.2 08/06/2019 1531   HCT 43.0 01/28/2019 1413   PLT 121 (L) 08/06/2019 1531   MCV 85.5 08/06/2019 1531   MCV 89 01/28/2019 1413   MCH 30.5 08/06/2019 1531   MCHC 35.7 08/06/2019 1531   RDW 15.5 (H) 08/06/2019 1531   RDW 16.8 (H) 01/28/2019 1413   LYMPHSABS 1,365 08/06/2019 1531   LYMPHSABS 1.9 01/28/2019 1413   MONOABS 0.8 11/03/2018 1407   EOSABS 0 (L) 08/06/2019 1531   EOSABS 0.1 01/28/2019 1413   BASOSABS 12 08/06/2019 1531   BASOSABS 0.0 01/28/2019 1413   Iron/TIBC/Ferritin/ %Sat No results found for: IRON, TIBC, FERRITIN, IRONPCTSAT Lipid Panel     Component Value Date/Time   CHOL 236 (H) 08/29/2019 1105   TRIG 1,274 (HH) 08/29/2019 1105   HDL 18 (L) 08/29/2019 1105   CHOLHDL 4 11/21/2018 1102   VLDL 71.8 (H) 11/21/2018 1102   LDLCALC Comment 01/28/2019 1413   LDLDIRECT 63.0 11/21/2018 1102    Hepatic Function Panel     Component Value Date/Time   PROT 6.5 08/29/2019 1105   ALBUMIN 4.0 08/29/2019 1105   AST 58 (H) 08/29/2019 1105   ALT 64 (H) 08/29/2019 1105   ALKPHOS 77 08/29/2019 1105   BILITOT 0.3 08/29/2019 1105   BILIDIR 0.1 09/04/2008 0953      Component Value Date/Time   TSH 3.370 01/28/2019 1413   TSH 1.78 01/15/2007 0829      OBESITY BEHAVIORAL INTERVENTION VISIT  Today's visit was # 15   Starting weight: 182 lbs Starting date: 01/28/2019 Today's weight : 186 lbs  Today's date: 08/29/2019 Total lbs lost to date: 0    ASK: We discussed the diagnosis of obesity with Karna Dupes today and Jerry York agreed to give [name] permission to discuss obesity behavioral modification therapy today.  ASSESS: Alanson has the diagnosis of obesity and his BMI today is 30.95 Callin is in the action stage of change   ADVISE: Orie was educated on the multiple health risks of obesity as well as the benefit of weight loss to improve his health. He was advised of the need for long term treatment and the importance of lifestyle modifications to improve his current health and to decrease his risk of future health problems.  AGREE: Multiple dietary modification options and treatment options were discussed and  Nicolas agreed to follow the recommendations documented in the above note.  ARRANGE: Karston was educated on the importance of frequent visits to treat obesity as outlined per CMS and USPSTF guidelines and  agreed to schedule his next follow up appointment today.  I, Trixie Dredge, am acting as transcriptionist for Ilene Qua, MD  I have reviewed the above documentation for accuracy and completeness, and I agree with the above. - Ilene Qua, MD

## 2019-09-05 ENCOUNTER — Ambulatory Visit (INDEPENDENT_AMBULATORY_CARE_PROVIDER_SITE_OTHER): Payer: PPO | Admitting: Family Medicine

## 2019-09-06 ENCOUNTER — Ambulatory Visit: Payer: PPO | Admitting: Family Medicine

## 2019-09-06 ENCOUNTER — Ambulatory Visit (INDEPENDENT_AMBULATORY_CARE_PROVIDER_SITE_OTHER): Payer: PPO | Admitting: Family Medicine

## 2019-09-06 ENCOUNTER — Encounter: Payer: Self-pay | Admitting: Family Medicine

## 2019-09-06 DIAGNOSIS — Z794 Long term (current) use of insulin: Secondary | ICD-10-CM

## 2019-09-06 DIAGNOSIS — N183 Chronic kidney disease, stage 3 unspecified: Secondary | ICD-10-CM

## 2019-09-06 DIAGNOSIS — I10 Essential (primary) hypertension: Secondary | ICD-10-CM | POA: Diagnosis not present

## 2019-09-06 DIAGNOSIS — I152 Hypertension secondary to endocrine disorders: Secondary | ICD-10-CM

## 2019-09-06 DIAGNOSIS — M1A09X0 Idiopathic chronic gout, multiple sites, without tophus (tophi): Secondary | ICD-10-CM | POA: Diagnosis not present

## 2019-09-06 DIAGNOSIS — E1159 Type 2 diabetes mellitus with other circulatory complications: Secondary | ICD-10-CM | POA: Diagnosis not present

## 2019-09-06 DIAGNOSIS — E1169 Type 2 diabetes mellitus with other specified complication: Secondary | ICD-10-CM

## 2019-09-06 DIAGNOSIS — F419 Anxiety disorder, unspecified: Secondary | ICD-10-CM | POA: Diagnosis not present

## 2019-09-06 DIAGNOSIS — E785 Hyperlipidemia, unspecified: Secondary | ICD-10-CM | POA: Diagnosis not present

## 2019-09-06 DIAGNOSIS — E1122 Type 2 diabetes mellitus with diabetic chronic kidney disease: Secondary | ICD-10-CM

## 2019-09-06 MED ORDER — ATORVASTATIN CALCIUM 40 MG PO TABS: 40.0000 mg | ORAL_TABLET | Freq: Every day | ORAL | 3 refills | Status: DC

## 2019-09-06 MED ORDER — TRAZODONE HCL 100 MG PO TABS: 200.0000 mg | ORAL_TABLET | Freq: Every evening | ORAL | 3 refills | Status: DC | PRN

## 2019-09-06 NOTE — Assessment & Plan Note (Signed)
Doing well.  No recent flares.  Continue allopurinol 300 mg daily and management per rheumatology.

## 2019-09-06 NOTE — Assessment & Plan Note (Signed)
Continue Klonopin.  Increase trazodone to 200 mg daily.  Follow-up in 6 months.

## 2019-09-06 NOTE — Assessment & Plan Note (Signed)
Significantly elevated triglyceride levels.  Possibly due to uncontrolled diabetes.  Will increase atorvastatin to 40 mg daily.  May need addition of fibrate of triglycerides do not respond to better glycemic control increase dose statin.

## 2019-09-06 NOTE — Progress Notes (Signed)
   Chief Complaint:  Jerry York is a 68 y.o. male who presents today for a virtual office visit with a chief complaint of HLD follow up.   Assessment/Plan:  Anxiety Continue Klonopin.  Increase trazodone to 200 mg daily.  Follow-up in 6 months.  Gout Doing well.  No recent flares.  Continue allopurinol 300 mg daily and management per rheumatology.  Type 2 diabetes mellitus ([place]) Improving per patient.  Continue management per endocrinology.  Hypertension associated with diabetes ([place]) Reported blood pressures are at goal.  Continue amlodipine 5 mg daily and metoprolol tartrate 100 mg twice daily.  Hyperlipidemia associated with type 2 diabetes mellitus (HCC) Significantly elevated triglyceride levels.  Possibly due to uncontrolled diabetes.  Will increase atorvastatin to 40 mg daily.  May need addition of fibrate of triglycerides do not respond to better glycemic control increase dose statin.    Subjective:  HPI:  His stable, chronic medical conditions are outlined below:  #Essential hypertension -On amlodipine 5 mg daily and metoprolol tartrate 100 mg twice daily and tolerating well. - Home BPs: 120s/70s - ROS: No reported chest pain or shortness of breath.  # Dyslipidemia -On Lipitor 20 mg daily and tolerating well. - ROS: No reported myalgias.  # Anxiety / insomnia -Klonopin 0.5 mg twice daily as needed.  Tolerating well without side effects. -On trazadone 150mg  nightly and tolerating well -ROS: No reported SI or HI  % Gout -Follows with rheumatology - On allopurinol 300 mg daily and colchicine 0.6 mg daily.  % T2DM -Follows with endocrinology for this.  On Novlalin R 80 units 3 times daily with meals.   ROS: Per HPI  PMH: He reports that he has never smoked. He has never used smokeless tobacco. He reports that he does not drink alcohol or use drugs.      Objective/Observations  Physical Exam: Gen: NAD, resting comfortably Pulm: Normal work of  breathing Neuro: Grossly normal, moves all extremities Psych: Normal affect and thought content  Virtual Visit via Video   I connected with Jerry York on 09/06/19 at 10:40 AM EDT by a video enabled telemedicine application and verified that I am speaking with the correct person using two identifiers. I discussed the limitations of evaluation and management by telemedicine and the availability of in person appointments. The patient expressed understanding and agreed to proceed.   Patient location: Home Provider location: [place] participating in the virtual visit: Myself and Patient     Algis Greenhouse. Jerline Pain, MD 09/06/2019 11:12 AM

## 2019-09-06 NOTE — Assessment & Plan Note (Signed)
Improving per patient.  Continue management per endocrinology.

## 2019-09-06 NOTE — Assessment & Plan Note (Signed)
Reported blood pressures are at goal.  Continue amlodipine 5 mg daily and metoprolol tartrate 100 mg twice daily.

## 2019-09-11 NOTE — Progress Notes (Signed)
[facility] [address] [place], [place] - [address] [place] [address] Phone: [phone] Fax: [phone]    Your procedure is scheduled on Monday, September 21st.  Report to [facility] Main Entrance "A" at 5:30 A.M., and check in at the Admitting office.  Call this number if you have problems the morning of surgery:  [phone]  Call [phone] if you have any questions prior to your surgery date Monday-Friday 8am-4pm  VISITATION HOURS: 10 AM - 8PM   Remember:  Do not eat or drink after midnight the night before your surgery   Take these medicines the morning of surgery with A SIP OF WATER  allopurinol (ZYLOPRIM) amLODipine (NORVASC) clonazePAM (KLONOPIN)  metoprolol tartrate (LOPRESSOR)   If needed - colchicine  7 days prior to surgery STOP taking any Aspirin (unless otherwise instructed by your surgeon), Aleve, Naproxen, Ibuprofen, Motrin, Advil, Goody's, BC's, all herbal medications, fish oil, and all vitamins.  How to Manage Your Diabetes   WHAT DO I DO ABOUT MY DIABETES MEDICATION?    . THE MORNING OF SURGERY, do NOT  _insulin lispro (HUMALOG.)  . If your CBG is greater than 220 mg/dL, you may take  of your sliding scale (correction) dose of insulin.  Before and After Surgery  Why is it important to control my blood sugar before and after surgery? . Improving blood sugar levels before and after surgery helps healing and can limit problems. . A way of improving blood sugar control is eating a healthy diet by: o  Eating less sugar and carbohydrates o  Increasing activity/exercise o  Talking with your doctor about reaching your blood sugar goals . High blood sugars (greater than 180 mg/dL) can raise your risk of infections and slow your recovery, so you will need to focus on controlling your diabetes during the weeks before surgery. . Make sure that the doctor who takes care of your diabetes knows about your  planned surgery including the date and location.  How do I manage my blood sugar before surgery? . Check your blood sugar at least 4 times a day, starting 2 days before surgery, to make sure that the level is not too high or low. o Check your blood sugar the morning of your surgery when you wake up and every 2 hours until you get to the Short Stay unit. . If your blood sugar is less than 70 mg/dL, you will need to treat for low blood sugar: o Do not take insulin. o Treat a low blood sugar (less than 70 mg/dL) with  cup of clear juice (cranberry or apple), 4 glucose tablets, OR glucose gel. Recheck blood sugar in 15 minutes after treatment (to make sure it is greater than 70 mg/dL). If your blood sugar is not greater than 70 mg/dL on recheck, call [phone] o  for further instructions. . Report your blood sugar to the short stay nurse when you get to Short Stay.  . If you are admitted to the hospital after surgery: o Your blood sugar will be checked by the staff and you will probably be given insulin after surgery (instead of oral diabetes medicines) to make sure you have good blood sugar levels. o The goal for blood sugar control after surgery is 80-180 mg/dL.  Reviewed and Endorsed by [facility] Patient Education Committee, August 2015   The Morning of Surgery  Do not wear jewelry, make-up or nail polish.  Do not wear lotions, powders, or  perfumes/colognes, or deodorant  Do not shave 48 hours prior to surgery.  Men may shave face and neck.  Do not bring valuables to the hospital.  [facility] is not responsible for any belongings or valuables.  If you are a smoker, DO NOT Smoke 24 hours prior to surgery IF you wear a CPAP at night please bring your mask, tubing, and machine the morning of surgery   Remember that you must have someone to transport you home after your surgery, and remain with you for 24 hours if you are discharged the same day.  Contacts, glasses, hearing aids,  dentures or bridgework may not be worn into surgery.   Leave your suitcase in the car.  After surgery it may be brought to your room.  For patients admitted to the hospital, discharge time will be determined by your treatment team.  Patients discharged the day of surgery will not be allowed to drive home.   Special instructions:   [place]- Preparing For Surgery  Before surgery, you can play an important role. Because skin is not sterile, your skin needs to be as free of germs as possible. You can reduce the number of germs on your skin by washing with CHG (chlorahexidine gluconate) Soap before surgery.  CHG is an antiseptic cleaner which kills germs and bonds with the skin to continue killing germs even after washing.    Oral Hygiene is also important to reduce your risk of infection.  Remember - BRUSH YOUR TEETH THE MORNING OF SURGERY WITH YOUR REGULAR TOOTHPASTE  Please do not use if you have an allergy to CHG or antibacterial soaps. If your skin becomes reddened/irritated stop using the CHG.  Do not shave (including legs and underarms) for at least 48 hours prior to first CHG shower. It is OK to shave your face.  Please follow these instructions carefully.   1. Shower the NIGHT BEFORE SURGERY and the MORNING OF SURGERY with CHG Soap.   2. If you chose to wash your hair, wash your hair first as usual with your normal shampoo.  3. After you shampoo, rinse your hair and body thoroughly to remove the shampoo.  4. Use CHG as you would any other liquid soap. You can apply CHG directly to the skin and wash gently with a scrungie or a clean washcloth.   5. Apply the CHG Soap to your body ONLY FROM THE NECK DOWN.  Do not use on open wounds or open sores. Avoid contact with your eyes, ears, mouth and genitals (private parts). Wash Face and genitals (private parts)  with your normal soap.   6. Wash thoroughly, paying special attention to the area where your surgery will be  performed.  7. Thoroughly rinse your body with warm water from the neck down.  8. DO NOT shower/wash with your normal soap after using and rinsing off the CHG Soap.  9. Pat yourself dry with a CLEAN TOWEL.  10. Wear CLEAN PAJAMAS to bed the night before surgery, wear comfortable clothes the morning of surgery  11. Place CLEAN SHEETS on your bed the night of your first shower and DO NOT SLEEP WITH PETS.  Day of Surgery: Do not apply any deodorants/lotions. Please shower the morning of surgery with the CHG soap  Please wear clean clothes to the hospital/surgery center.   Remember to brush your teeth WITH YOUR REGULAR TOOTHPASTE.  Please read over the following fact sheets that you were given.

## 2019-09-12 ENCOUNTER — Other Ambulatory Visit (HOSPITAL_COMMUNITY)
Admission: RE | Admit: 2019-09-12 | Discharge: 2019-09-12 | Disposition: A | Discharge status: Home / Self Care | Payer: PPO | Source: Ambulatory Visit | Attending: Neurological Surgery | Admitting: Neurological Surgery

## 2019-09-12 ENCOUNTER — Other Ambulatory Visit: Payer: Self-pay

## 2019-09-12 ENCOUNTER — Ambulatory Visit (HOSPITAL_COMMUNITY)
Admission: RE | Admit: 2019-09-12 | Discharge: 2019-09-12 | Disposition: A | Discharge status: Home / Self Care | Payer: PPO | Source: Ambulatory Visit | Attending: Neurological Surgery | Admitting: Neurological Surgery

## 2019-09-12 ENCOUNTER — Encounter (HOSPITAL_COMMUNITY)
Admission: RE | Admit: 2019-09-12 | Discharge: 2019-09-12 | Disposition: A | Discharge status: Home / Self Care | Payer: PPO | Source: Ambulatory Visit | Attending: Neurological Surgery | Admitting: Neurological Surgery

## 2019-09-12 ENCOUNTER — Encounter (HOSPITAL_COMMUNITY): Payer: Self-pay

## 2019-09-12 DIAGNOSIS — Z20828 Contact with and (suspected) exposure to other viral communicable diseases: Secondary | ICD-10-CM | POA: Insufficient documentation

## 2019-09-12 DIAGNOSIS — I10 Essential (primary) hypertension: Secondary | ICD-10-CM | POA: Insufficient documentation

## 2019-09-12 DIAGNOSIS — E119 Type 2 diabetes mellitus without complications: Secondary | ICD-10-CM | POA: Insufficient documentation

## 2019-09-12 DIAGNOSIS — M069 Rheumatoid arthritis, unspecified: Secondary | ICD-10-CM | POA: Diagnosis not present

## 2019-09-12 DIAGNOSIS — M48061 Spinal stenosis, lumbar region without neurogenic claudication: Secondary | ICD-10-CM | POA: Diagnosis not present

## 2019-09-12 DIAGNOSIS — E785 Hyperlipidemia, unspecified: Secondary | ICD-10-CM | POA: Diagnosis not present

## 2019-09-12 DIAGNOSIS — Z79899 Other long term (current) drug therapy: Secondary | ICD-10-CM | POA: Insufficient documentation

## 2019-09-12 DIAGNOSIS — Z01818 Encounter for other preprocedural examination: Secondary | ICD-10-CM | POA: Insufficient documentation

## 2019-09-12 DIAGNOSIS — Z794 Long term (current) use of insulin: Secondary | ICD-10-CM | POA: Diagnosis not present

## 2019-09-12 DIAGNOSIS — I517 Cardiomegaly: Secondary | ICD-10-CM | POA: Diagnosis not present

## 2019-09-12 LAB — BASIC METABOLIC PANEL
Anion gap: 11 (ref 5–15)
BUN: 35 mg/dL — ABNORMAL HIGH (ref 8–23)
CO2: 27 mmol/L (ref 22–32)
Calcium: 9 mg/dL (ref 8.9–10.3)
Chloride: 101 mmol/L (ref 98–111)
Creatinine, Ser: 1.41 mg/dL — ABNORMAL HIGH (ref 0.61–1.24)
GFR calc Af Amer: 59 mL/min — ABNORMAL LOW (ref >60)
GFR calc non Af Amer: 51 mL/min — ABNORMAL LOW (ref >60)
Glucose, Bld: 187 mg/dL — ABNORMAL HIGH (ref 70–99)
Potassium: 4.2 mmol/L (ref 3.5–5.1)
Sodium: 139 mmol/L (ref 135–145)

## 2019-09-12 LAB — CBC WITH DIFFERENTIAL/PLATELET
Abs Immature Granulocytes: 0.03 10*3/uL (ref 0.00–0.07)
Basophils Absolute: 0 10*3/uL (ref 0.0–0.1)
Basophils Relative: 0 %
Eosinophils Absolute: 0 10*3/uL (ref 0.0–0.5)
Eosinophils Relative: 0 %
HCT: 43.8 % (ref 39.0–52.0)
Hemoglobin: 15.7 g/dL (ref 13.0–17.0)
Immature Granulocytes: 1 %
Lymphocytes Relative: 31 %
Lymphs Abs: 1.2 10*3/uL (ref 0.7–4.0)
MCH: 32 pg (ref 26.0–34.0)
MCHC: 35.8 g/dL (ref 30.0–36.0)
MCV: 89.2 fL (ref 80.0–100.0)
Monocytes Absolute: 0.2 10*3/uL (ref 0.1–1.0)
Monocytes Relative: 6 %
Neutro Abs: 2.4 10*3/uL (ref 1.7–7.7)
Neutrophils Relative %: 62 %
Platelets: 127 10*3/uL — ABNORMAL LOW (ref 150–400)
RBC: 4.91 MIL/uL (ref 4.22–5.81)
RDW: 16.3 % — ABNORMAL HIGH (ref 11.5–15.5)
WBC: 3.9 10*3/uL — ABNORMAL LOW (ref 4.0–10.5)
nRBC: 0 % (ref 0.0–0.2)

## 2019-09-12 LAB — PROTIME-INR
INR: 1 (ref 0.8–1.2)
Prothrombin Time: 12.7 seconds (ref 11.4–15.2)

## 2019-09-12 LAB — GLUCOSE, CAPILLARY: Glucose-Capillary: 175 mg/dL — ABNORMAL HIGH (ref 70–99)

## 2019-09-12 LAB — SURGICAL PCR SCREEN
MRSA, PCR: NEGATIVE
Staphylococcus aureus: NEGATIVE

## 2019-09-12 NOTE — Progress Notes (Signed)
   09/12/19 1308  OBSTRUCTIVE SLEEP APNEA  Have you ever been diagnosed with sleep apnea through a sleep study? No  Do you snore loudly (loud enough to be heard through closed doors)?  1  Do you often feel tired, fatigued, or sleepy during the daytime (such as falling asleep during driving or talking to someone)? 0  Has anyone observed you stop breathing during your sleep? 0  Do you have, or are you being treated for high blood pressure? 1  BMI more than 35 kg/m2? 0  Age > 50 (1-yes) 1  Neck circumference greater than:Male 16 inches or larger, Male 17inches or larger? 1  Male Gender (Yes=1) 1  Obstructive Sleep Apnea Score 5  Score 5 or greater  Results sent to PCP

## 2019-09-12 NOTE — Progress Notes (Signed)
PCP - Dr. Dimas Chyle Cardiologist - denies Endocrinologist - per patient, Dr. Delrae Rend  PPM/ICD - N/A Device Orders - N/A\, Rep Notified  Chest x-ray - 09/12/2019 EKG - 09/12/2019 Stress Test - denies ECHO - 08/09/17 Cardiac Cath - denies  Sleep Study - denies CPAP - N/A  Fasting Blood Sugar - 170-180 Checks Blood Sugar 7 times a day  Blood Thinner Instructions: N/A Aspirin Instructions:N/A  ERAS Protcol - No PRE-SURGERY Ensure - N/A  COVID TEST- Scheduled for Monday, 09/16/2019  Anesthesia review: YES, A1C 8.1 (08/09/2019)  Patient denies shortness of breath, fever, cough and chest pain at PAT appointment  Patient verbalized understanding of instructions that were given to them at the PAT appointment. Patient was also instructed that they will need to review over the PAT instructions again at home before surgery.

## 2019-09-13 ENCOUNTER — Encounter (HOSPITAL_COMMUNITY): Payer: Self-pay

## 2019-09-13 LAB — NOVEL CORONAVIRUS, NAA (HOSP ORDER, SEND-OUT TO REF LAB; TAT 18-24 HRS): SARS-CoV-2, NAA: NOT DETECTED

## 2019-09-13 NOTE — Anesthesia Preprocedure Evaluation (Addendum)
Anesthesia Evaluation  Patient identified by MRN, date of birth, ID band Patient awake    Reviewed: Allergy & Precautions, NPO status , Patient's Chart, lab work & pertinent test results, reviewed documented beta blocker date and time   History of Anesthesia Complications Negative for: history of anesthetic complications  Airway Mallampati: II  TM Distance: >3 FB Neck ROM: Full    Dental no notable dental hx. (+) Teeth Intact, Dental Advisory Given, Chipped,    Pulmonary neg pulmonary ROS,    Pulmonary exam normal        Cardiovascular hypertension, Pt. on home beta blockers and Pt. on medications Normal cardiovascular exam  TTE 2018: Normal LV size with mild LV hypertrophy. 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  Reproductive/Obstetrics negative OB ROS                         Anesthesia Physical Anesthesia Plan  ASA: II  Anesthesia Plan: General   Post-op Pain Management:    Induction: Intravenous  PONV Risk Score and Plan: 3 and Treatment may vary due to age or medical condition, Ondansetron, Dexamethasone and Midazolam  Airway Management Planned: Oral ETT  Additional Equipment: None  Intra-op Plan:   Post-operative Plan: Extubation in OR  Informed Consent: I have reviewed the patients History and Physical, chart, labs and discussed the procedure including the risks, benefits and alternatives for the proposed anesthesia with the patient or authorized  representative who has indicated his/her understanding and acceptance.     Dental advisory given  Plan Discussed with: CRNA  Anesthesia Plan Comments: ( )     Anesthesia Quick Evaluation

## 2019-09-16 ENCOUNTER — Inpatient Hospital Stay (HOSPITAL_COMMUNITY): Payer: PPO | Admitting: Anesthesiology

## 2019-09-16 ENCOUNTER — Inpatient Hospital Stay (HOSPITAL_COMMUNITY): Payer: PPO

## 2019-09-16 ENCOUNTER — Encounter (HOSPITAL_COMMUNITY)
Admission: RE | Disposition: A | Discharge status: Home / Self Care | Payer: Self-pay | Source: Home / Self Care | Attending: Neurological Surgery

## 2019-09-16 ENCOUNTER — Inpatient Hospital Stay (HOSPITAL_COMMUNITY)
Admission: RE | Admit: 2019-09-16 | Discharge: 2019-09-18 | DRG: 516 | Disposition: A | Discharge status: Home / Self Care | Payer: PPO | Attending: Neurological Surgery | Admitting: Neurological Surgery

## 2019-09-16 ENCOUNTER — Encounter (HOSPITAL_COMMUNITY): Payer: Self-pay | Admitting: Certified Registered"

## 2019-09-16 ENCOUNTER — Inpatient Hospital Stay (HOSPITAL_COMMUNITY): Payer: PPO | Admitting: Physician Assistant

## 2019-09-16 ENCOUNTER — Other Ambulatory Visit: Payer: Self-pay

## 2019-09-16 DIAGNOSIS — I452 Bifascicular block: Secondary | ICD-10-CM | POA: Diagnosis present

## 2019-09-16 DIAGNOSIS — Z833 Family history of diabetes mellitus: Secondary | ICD-10-CM

## 2019-09-16 DIAGNOSIS — Z9889 Other specified postprocedural states: Secondary | ICD-10-CM

## 2019-09-16 DIAGNOSIS — E785 Hyperlipidemia, unspecified: Secondary | ICD-10-CM | POA: Diagnosis not present

## 2019-09-16 DIAGNOSIS — M069 Rheumatoid arthritis, unspecified: Secondary | ICD-10-CM | POA: Diagnosis present

## 2019-09-16 DIAGNOSIS — M79605 Pain in left leg: Secondary | ICD-10-CM | POA: Diagnosis not present

## 2019-09-16 DIAGNOSIS — Z882 Allergy status to sulfonamides status: Secondary | ICD-10-CM

## 2019-09-16 DIAGNOSIS — Z823 Family history of stroke: Secondary | ICD-10-CM

## 2019-09-16 DIAGNOSIS — I440 Atrioventricular block, first degree: Secondary | ICD-10-CM | POA: Diagnosis present

## 2019-09-16 DIAGNOSIS — Z794 Long term (current) use of insulin: Secondary | ICD-10-CM

## 2019-09-16 DIAGNOSIS — E119 Type 2 diabetes mellitus without complications: Secondary | ICD-10-CM | POA: Diagnosis not present

## 2019-09-16 DIAGNOSIS — M109 Gout, unspecified: Secondary | ICD-10-CM | POA: Diagnosis present

## 2019-09-16 DIAGNOSIS — I10 Essential (primary) hypertension: Secondary | ICD-10-CM | POA: Diagnosis not present

## 2019-09-16 DIAGNOSIS — F419 Anxiety disorder, unspecified: Secondary | ICD-10-CM | POA: Diagnosis not present

## 2019-09-16 DIAGNOSIS — Z8349 Family history of other endocrine, nutritional and metabolic diseases: Secondary | ICD-10-CM | POA: Diagnosis not present

## 2019-09-16 DIAGNOSIS — Z79899 Other long term (current) drug therapy: Secondary | ICD-10-CM | POA: Diagnosis not present

## 2019-09-16 DIAGNOSIS — Z888 Allergy status to other drugs, medicaments and biological substances status: Secondary | ICD-10-CM | POA: Diagnosis not present

## 2019-09-16 DIAGNOSIS — Z8249 Family history of ischemic heart disease and other diseases of the circulatory system: Secondary | ICD-10-CM

## 2019-09-16 DIAGNOSIS — Z881 Allergy status to other antibiotic agents status: Secondary | ICD-10-CM

## 2019-09-16 DIAGNOSIS — Z419 Encounter for procedure for purposes other than remedying health state, unspecified: Secondary | ICD-10-CM

## 2019-09-16 DIAGNOSIS — M48061 Spinal stenosis, lumbar region without neurogenic claudication: Principal | ICD-10-CM | POA: Diagnosis present

## 2019-09-16 DIAGNOSIS — Z9079 Acquired absence of other genital organ(s): Secondary | ICD-10-CM | POA: Diagnosis not present

## 2019-09-16 DIAGNOSIS — M79604 Pain in right leg: Secondary | ICD-10-CM | POA: Diagnosis present

## 2019-09-16 HISTORY — PX: LUMBAR LAMINECTOMY/DECOMPRESSION MICRODISCECTOMY: SHX5026

## 2019-09-16 LAB — GLUCOSE, CAPILLARY
Glucose-Capillary: 184 mg/dL — ABNORMAL HIGH (ref 70–99)
Glucose-Capillary: 234 mg/dL — ABNORMAL HIGH (ref 70–99)
Glucose-Capillary: 287 mg/dL — ABNORMAL HIGH (ref 70–99)
Glucose-Capillary: 437 mg/dL — ABNORMAL HIGH (ref 70–99)
Glucose-Capillary: 406 mg/dL — ABNORMAL HIGH (ref 70–99)
Glucose-Capillary: 319 mg/dL — ABNORMAL HIGH (ref 70–99)

## 2019-09-16 SURGERY — LUMBAR LAMINECTOMY/DECOMPRESSION MICRODISCECTOMY 4 LEVEL
Anesthesia: General | Site: Back

## 2019-09-16 MED ORDER — MIDAZOLAM HCL 2 MG/2ML IJ SOLN
INTRAMUSCULAR | Status: AC
  Filled 2019-09-16: qty 2

## 2019-09-16 MED ORDER — ONDANSETRON HCL 4 MG/2ML IJ SOLN: 4.0000 mg | Freq: Four times a day (QID) | INTRAMUSCULAR | Status: DC | PRN

## 2019-09-16 MED ORDER — ACETAMINOPHEN 650 MG RE SUPP: 650.0000 mg | RECTAL | Status: DC | PRN

## 2019-09-16 MED ORDER — SODIUM CHLORIDE 0.9% FLUSH
3.0000 mL | Freq: Two times a day (BID) | INTRAVENOUS | Status: DC
  Administered 2019-09-16: 3 mL via INTRAVENOUS

## 2019-09-16 MED ORDER — DEXAMETHASONE SODIUM PHOSPHATE 10 MG/ML IJ SOLN
INTRAMUSCULAR | Status: AC
  Filled 2019-09-16: qty 1

## 2019-09-16 MED ORDER — TRAZODONE HCL 100 MG PO TABS
200.0000 mg | ORAL_TABLET | Freq: Every day | ORAL | Status: DC
  Administered 2019-09-16 – 2019-09-17 (×2): 200 mg via ORAL
  Filled 2019-09-16 (×3): qty 2

## 2019-09-16 MED ORDER — THROMBIN 20000 UNITS EX SOLR
CUTANEOUS | Status: DC | PRN
  Administered 2019-09-16: 20 mL via TOPICAL

## 2019-09-16 MED ORDER — EPHEDRINE SULFATE-NACL 50-0.9 MG/10ML-% IV SOSY
PREFILLED_SYRINGE | INTRAVENOUS | Status: DC | PRN
  Administered 2019-09-16 (×2): 5 mg via INTRAVENOUS

## 2019-09-16 MED ORDER — CEFAZOLIN SODIUM-DEXTROSE 2-4 GM/100ML-% IV SOLN
2.0000 g | Freq: Three times a day (TID) | INTRAVENOUS | Status: AC
  Administered 2019-09-16 (×2): 2 g via INTRAVENOUS
  Filled 2019-09-16 (×2): qty 100

## 2019-09-16 MED ORDER — SENNA 8.6 MG PO TABS
1.0000 | ORAL_TABLET | Freq: Two times a day (BID) | ORAL | Status: DC
  Administered 2019-09-16 – 2019-09-18 (×5): 8.6 mg via ORAL
  Filled 2019-09-16 (×5): qty 1

## 2019-09-16 MED ORDER — HYDROMORPHONE HCL 1 MG/ML IJ SOLN
0.2500 mg | INTRAMUSCULAR | Status: DC | PRN
  Administered 2019-09-16 (×4): 0.25 mg via INTRAVENOUS

## 2019-09-16 MED ORDER — LACTATED RINGERS IV SOLN
INTRAVENOUS | Status: DC | PRN
  Administered 2019-09-16 (×2): via INTRAVENOUS

## 2019-09-16 MED ORDER — SUCCINYLCHOLINE CHLORIDE 200 MG/10ML IV SOSY
PREFILLED_SYRINGE | INTRAVENOUS | Status: AC
  Filled 2019-09-16: qty 10

## 2019-09-16 MED ORDER — GLYCOPYRROLATE PF 0.2 MG/ML IJ SOSY
PREFILLED_SYRINGE | INTRAMUSCULAR | Status: AC
  Filled 2019-09-16: qty 1

## 2019-09-16 MED ORDER — PROMETHAZINE HCL 25 MG/ML IJ SOLN: 6.2500 mg | INTRAMUSCULAR | Status: DC | PRN

## 2019-09-16 MED ORDER — ACETAMINOPHEN 500 MG PO TABS
1000.0000 mg | ORAL_TABLET | Freq: Once | ORAL | Status: AC
  Administered 2019-09-16: 1000 mg via ORAL
  Filled 2019-09-16: qty 2

## 2019-09-16 MED ORDER — ONDANSETRON HCL 4 MG PO TABS: 4.0000 mg | ORAL_TABLET | Freq: Four times a day (QID) | ORAL | Status: DC | PRN

## 2019-09-16 MED ORDER — CHLORHEXIDINE GLUCONATE CLOTH 2 % EX PADS: 6.0000 | MEDICATED_PAD | Freq: Once | CUTANEOUS | Status: DC

## 2019-09-16 MED ORDER — SUGAMMADEX SODIUM 200 MG/2ML IV SOLN
INTRAVENOUS | Status: DC | PRN
  Administered 2019-09-16: 200 mg via INTRAVENOUS

## 2019-09-16 MED ORDER — METHOCARBAMOL 500 MG PO TABS
500.0000 mg | ORAL_TABLET | Freq: Four times a day (QID) | ORAL | Status: DC | PRN
  Administered 2019-09-16 – 2019-09-18 (×6): 500 mg via ORAL
  Filled 2019-09-16 (×5): qty 1

## 2019-09-16 MED ORDER — MORPHINE SULFATE (PF) 2 MG/ML IV SOLN: 2.0000 mg | INTRAVENOUS | Status: DC | PRN

## 2019-09-16 MED ORDER — ACETAMINOPHEN 325 MG PO TABS: 650.0000 mg | ORAL_TABLET | ORAL | Status: DC | PRN

## 2019-09-16 MED ORDER — LIDOCAINE 2% (20 MG/ML) 5 ML SYRINGE
INTRAMUSCULAR | Status: AC
  Filled 2019-09-16: qty 5

## 2019-09-16 MED ORDER — METHOCARBAMOL 500 MG PO TABS
ORAL_TABLET | ORAL | Status: AC
  Administered 2019-09-16: 500 mg via ORAL
  Filled 2019-09-16: qty 1

## 2019-09-16 MED ORDER — ONDANSETRON HCL 4 MG/2ML IJ SOLN
INTRAMUSCULAR | Status: AC
  Filled 2019-09-16: qty 2

## 2019-09-16 MED ORDER — FENTANYL CITRATE (PF) 250 MCG/5ML IJ SOLN
INTRAMUSCULAR | Status: AC
  Filled 2019-09-16: qty 5

## 2019-09-16 MED ORDER — ONDANSETRON HCL 4 MG/2ML IJ SOLN
INTRAMUSCULAR | Status: DC | PRN
  Administered 2019-09-16: 4 mg via INTRAVENOUS

## 2019-09-16 MED ORDER — CLONAZEPAM 0.5 MG PO TABS
0.5000 mg | ORAL_TABLET | Freq: Two times a day (BID) | ORAL | Status: DC
  Administered 2019-09-16 – 2019-09-18 (×4): 0.5 mg via ORAL
  Filled 2019-09-16 (×4): qty 1

## 2019-09-16 MED ORDER — MENTHOL 3 MG MT LOZG
1.0000 | LOZENGE | OROMUCOSAL | Status: DC | PRN
  Administered 2019-09-16: 3 mg via ORAL
  Filled 2019-09-16: qty 9

## 2019-09-16 MED ORDER — SODIUM CHLORIDE 0.9 % IV SOLN
INTRAVENOUS | Status: DC | PRN
  Administered 2019-09-16: 500 mL

## 2019-09-16 MED ORDER — FUROSEMIDE 40 MG PO TABS
80.0000 mg | ORAL_TABLET | Freq: Two times a day (BID) | ORAL | Status: DC
  Administered 2019-09-16 – 2019-09-18 (×4): 80 mg via ORAL
  Filled 2019-09-16 (×4): qty 2

## 2019-09-16 MED ORDER — INSULIN ASPART 100 UNIT/ML ~~LOC~~ SOLN
0.0000 [IU] | Freq: Three times a day (TID) | SUBCUTANEOUS | Status: DC
  Administered 2019-09-16: 15 [IU] via SUBCUTANEOUS
  Administered 2019-09-16: 5 [IU] via SUBCUTANEOUS

## 2019-09-16 MED ORDER — FENTANYL CITRATE (PF) 100 MCG/2ML IJ SOLN
INTRAMUSCULAR | Status: DC | PRN
  Administered 2019-09-16: 100 ug via INTRAVENOUS
  Administered 2019-09-16: 50 ug via INTRAVENOUS

## 2019-09-16 MED ORDER — THROMBIN 5000 UNITS EX SOLR
CUTANEOUS | Status: AC
  Filled 2019-09-16: qty 5000

## 2019-09-16 MED ORDER — INSULIN ASPART 100 UNIT/ML ~~LOC~~ SOLN
0.0000 [IU] | Freq: Every day | SUBCUTANEOUS | Status: DC
  Administered 2019-09-16: 5 [IU] via SUBCUTANEOUS
  Administered 2019-09-17: 2 [IU] via SUBCUTANEOUS

## 2019-09-16 MED ORDER — ROCURONIUM BROMIDE 50 MG/5ML IV SOSY
PREFILLED_SYRINGE | INTRAVENOUS | Status: DC | PRN
  Administered 2019-09-16: 20 mg via INTRAVENOUS
  Administered 2019-09-16: 50 mg via INTRAVENOUS

## 2019-09-16 MED ORDER — PROPOFOL 10 MG/ML IV BOLUS
INTRAVENOUS | Status: AC
  Filled 2019-09-16: qty 20

## 2019-09-16 MED ORDER — THROMBIN 20000 UNITS EX SOLR
CUTANEOUS | Status: AC
  Filled 2019-09-16: qty 20000

## 2019-09-16 MED ORDER — PROPOFOL 10 MG/ML IV BOLUS
INTRAVENOUS | Status: DC | PRN
  Administered 2019-09-16: 170 mg via INTRAVENOUS

## 2019-09-16 MED ORDER — AMLODIPINE BESYLATE 5 MG PO TABS
5.0000 mg | ORAL_TABLET | Freq: Every day | ORAL | Status: DC
  Administered 2019-09-17 – 2019-09-18 (×2): 5 mg via ORAL
  Filled 2019-09-16 (×2): qty 1

## 2019-09-16 MED ORDER — PHENYLEPHRINE 40 MCG/ML (10ML) SYRINGE FOR IV PUSH (FOR BLOOD PRESSURE SUPPORT)
PREFILLED_SYRINGE | INTRAVENOUS | Status: DC | PRN
  Administered 2019-09-16 (×2): 80 ug via INTRAVENOUS
  Administered 2019-09-16: 120 ug via INTRAVENOUS

## 2019-09-16 MED ORDER — CEFAZOLIN SODIUM-DEXTROSE 2-4 GM/100ML-% IV SOLN
2.0000 g | INTRAVENOUS | Status: AC
  Administered 2019-09-16: 08:00:00 2 g via INTRAVENOUS
  Filled 2019-09-16: qty 100

## 2019-09-16 MED ORDER — EPHEDRINE 5 MG/ML INJ
INTRAVENOUS | Status: AC
  Filled 2019-09-16: qty 10

## 2019-09-16 MED ORDER — SODIUM CHLORIDE 0.9 % IV SOLN
INTRAVENOUS | Status: DC | PRN
  Administered 2019-09-16: 40 ug/min via INTRAVENOUS

## 2019-09-16 MED ORDER — PHENYLEPHRINE 40 MCG/ML (10ML) SYRINGE FOR IV PUSH (FOR BLOOD PRESSURE SUPPORT)
PREFILLED_SYRINGE | INTRAVENOUS | Status: AC
  Filled 2019-09-16: qty 10

## 2019-09-16 MED ORDER — LIDOCAINE 2% (20 MG/ML) 5 ML SYRINGE
INTRAMUSCULAR | Status: DC | PRN
  Administered 2019-09-16: 80 mg via INTRAVENOUS

## 2019-09-16 MED ORDER — HYDROCODONE-ACETAMINOPHEN 7.5-325 MG PO TABS
1.0000 | ORAL_TABLET | Freq: Four times a day (QID) | ORAL | Status: DC
  Administered 2019-09-16 – 2019-09-17 (×4): 1 via ORAL
  Filled 2019-09-16 (×4): qty 1

## 2019-09-16 MED ORDER — POTASSIUM CHLORIDE IN NACL 20-0.9 MEQ/L-% IV SOLN: INTRAVENOUS | Status: DC

## 2019-09-16 MED ORDER — ROCURONIUM BROMIDE 10 MG/ML (PF) SYRINGE
PREFILLED_SYRINGE | INTRAVENOUS | Status: AC
  Filled 2019-09-16: qty 10

## 2019-09-16 MED ORDER — MIDAZOLAM HCL 5 MG/5ML IJ SOLN
INTRAMUSCULAR | Status: DC | PRN
  Administered 2019-09-16: 1 mg via INTRAVENOUS

## 2019-09-16 MED ORDER — INSULIN ASPART 100 UNIT/ML ~~LOC~~ SOLN
0.0000 [IU] | Freq: Three times a day (TID) | SUBCUTANEOUS | Status: DC
  Administered 2019-09-17 (×2): 11 [IU] via SUBCUTANEOUS
  Administered 2019-09-17 – 2019-09-18 (×3): 7 [IU] via SUBCUTANEOUS

## 2019-09-16 MED ORDER — HYDROMORPHONE HCL 1 MG/ML IJ SOLN
INTRAMUSCULAR | Status: AC
  Administered 2019-09-16: 0.25 mg via INTRAVENOUS
  Filled 2019-09-16: qty 1

## 2019-09-16 MED ORDER — GLYCOPYRROLATE PF 0.2 MG/ML IJ SOSY
PREFILLED_SYRINGE | INTRAMUSCULAR | Status: DC | PRN
  Administered 2019-09-16: .1 mg via INTRAVENOUS

## 2019-09-16 MED ORDER — BUPIVACAINE HCL (PF) 0.25 % IJ SOLN
INTRAMUSCULAR | Status: DC | PRN
  Administered 2019-09-16: 5 mL

## 2019-09-16 MED ORDER — DEXAMETHASONE SODIUM PHOSPHATE 10 MG/ML IJ SOLN
10.0000 mg | Freq: Once | INTRAMUSCULAR | Status: AC
  Administered 2019-09-16: 10 mg via INTRAVENOUS
  Filled 2019-09-16: qty 1

## 2019-09-16 MED ORDER — INSULIN ASPART 100 UNIT/ML ~~LOC~~ SOLN
SUBCUTANEOUS | Status: AC
  Filled 2019-09-16: qty 1

## 2019-09-16 MED ORDER — OXYCODONE HCL 5 MG/5ML PO SOLN: 5.0000 mg | Freq: Once | ORAL | Status: AC | PRN

## 2019-09-16 MED ORDER — OXYCODONE HCL 5 MG PO TABS
ORAL_TABLET | ORAL | Status: AC
  Administered 2019-09-16: 5 mg via ORAL
  Filled 2019-09-16: qty 1

## 2019-09-16 MED ORDER — PHENOL 1.4 % MT LIQD: 1.0000 | OROMUCOSAL | Status: DC | PRN

## 2019-09-16 MED ORDER — 0.9 % SODIUM CHLORIDE (POUR BTL) OPTIME
TOPICAL | Status: DC | PRN
  Administered 2019-09-16: 1000 mL

## 2019-09-16 MED ORDER — METHOCARBAMOL 1000 MG/10ML IJ SOLN
500.0000 mg | Freq: Four times a day (QID) | INTRAVENOUS | Status: DC | PRN
  Filled 2019-09-16: qty 5

## 2019-09-16 MED ORDER — METOPROLOL TARTRATE 25 MG PO TABS
100.0000 mg | ORAL_TABLET | Freq: Two times a day (BID) | ORAL | Status: DC
  Administered 2019-09-16 – 2019-09-18 (×4): 100 mg via ORAL
  Filled 2019-09-16 (×4): qty 4

## 2019-09-16 MED ORDER — OXYCODONE HCL 5 MG PO TABS
5.0000 mg | ORAL_TABLET | Freq: Once | ORAL | Status: AC | PRN
  Administered 2019-09-16: 12:00:00 5 mg via ORAL

## 2019-09-16 MED ORDER — BUPIVACAINE HCL (PF) 0.25 % IJ SOLN
INTRAMUSCULAR | Status: AC
  Filled 2019-09-16: qty 30

## 2019-09-16 MED ORDER — COLCHICINE 0.6 MG PO TABS
0.6000 mg | ORAL_TABLET | Freq: Every day | ORAL | Status: DC
  Administered 2019-09-17 – 2019-09-18 (×2): 0.6 mg via ORAL
  Filled 2019-09-16 (×2): qty 1

## 2019-09-16 MED ORDER — SODIUM CHLORIDE 0.9% FLUSH: 3.0000 mL | INTRAVENOUS | Status: DC | PRN

## 2019-09-16 MED ORDER — THROMBIN 5000 UNITS EX SOLR
OROMUCOSAL | Status: DC | PRN
  Administered 2019-09-16: 08:00:00 5 mL via TOPICAL

## 2019-09-16 MED ORDER — SODIUM CHLORIDE 0.9 % IV SOLN: 250.0000 mL | INTRAVENOUS | Status: DC

## 2019-09-16 MED ORDER — ALLOPURINOL 100 MG PO TABS
100.0000 mg | ORAL_TABLET | Freq: Three times a day (TID) | ORAL | Status: DC
  Administered 2019-09-17 – 2019-09-18 (×4): 100 mg via ORAL
  Filled 2019-09-16 (×6): qty 1

## 2019-09-16 SURGICAL SUPPLY — 54 items
ADH SKN CLS APL DERMABOND .7 (GAUZE/BANDAGES/DRESSINGS) ×1
APL SKNCLS STERI-STRIP NONHPOA (GAUZE/BANDAGES/DRESSINGS) ×1
BAG DECANTER FOR FLEXI CONT (MISCELLANEOUS) ×2 IMPLANT
BAND INSRT 18 STRL LF DISP RB (MISCELLANEOUS) ×2
BAND RUBBER #18 3X1/16 STRL (MISCELLANEOUS) ×4 IMPLANT
BENZOIN TINCTURE PRP APPL 2/3 (GAUZE/BANDAGES/DRESSINGS) ×2 IMPLANT
BUR MATCHSTICK NEURO 3.0 LAGG (BURR) ×2 IMPLANT
CANISTER SUCT 3000ML PPV (MISCELLANEOUS) ×2 IMPLANT
CARTRIDGE OIL MAESTRO DRILL (MISCELLANEOUS) ×1 IMPLANT
COVER WAND RF STERILE (DRAPES) ×1 IMPLANT
DERMABOND ADVANCED (GAUZE/BANDAGES/DRESSINGS) ×1
DERMABOND ADVANCED .7 DNX12 (GAUZE/BANDAGES/DRESSINGS) IMPLANT
DIFFUSER DRILL AIR PNEUMATIC (MISCELLANEOUS) ×2 IMPLANT
DRAPE LAPAROTOMY 100X72X124 (DRAPES) ×2 IMPLANT
DRAPE MICROSCOPE LEICA (MISCELLANEOUS) ×2 IMPLANT
DRAPE POUCH INSTRU U-SHP 10X18 (DRAPES) ×2 IMPLANT
DRAPE SURG 17X23 STRL (DRAPES) ×2 IMPLANT
DRSG OPSITE 4X5.5 SM (GAUZE/BANDAGES/DRESSINGS) ×1 IMPLANT
DRSG OPSITE POSTOP 4X8 (GAUZE/BANDAGES/DRESSINGS) ×1 IMPLANT
DURAPREP 26ML APPLICATOR (WOUND CARE) ×2 IMPLANT
ELECT REM PT RETURN 9FT ADLT (ELECTROSURGICAL) ×2
ELECTRODE REM PT RTRN 9FT ADLT (ELECTROSURGICAL) ×1 IMPLANT
EVACUATOR 1/8 PVC DRAIN (DRAIN) ×1 IMPLANT
GAUZE 4X4 16PLY RFD (DISPOSABLE) IMPLANT
GLOVE BIO SURGEON STRL SZ7 (GLOVE) IMPLANT
GLOVE BIO SURGEON STRL SZ8 (GLOVE) ×2 IMPLANT
GLOVE BIOGEL PI IND STRL 7.0 (GLOVE) IMPLANT
GLOVE BIOGEL PI INDICATOR 7.0 (GLOVE)
GOWN STRL REUS W/ TWL LRG LVL3 (GOWN DISPOSABLE) IMPLANT
GOWN STRL REUS W/ TWL XL LVL3 (GOWN DISPOSABLE) ×1 IMPLANT
GOWN STRL REUS W/TWL 2XL LVL3 (GOWN DISPOSABLE) IMPLANT
GOWN STRL REUS W/TWL LRG LVL3 (GOWN DISPOSABLE)
GOWN STRL REUS W/TWL XL LVL3 (GOWN DISPOSABLE) ×2
HEMOSTAT POWDER KIT SURGIFOAM (HEMOSTASIS) ×1 IMPLANT
KIT BASIN OR (CUSTOM PROCEDURE TRAY) ×2 IMPLANT
KIT TURNOVER KIT B (KITS) ×2 IMPLANT
NDL HYPO 25X1 1.5 SAFETY (NEEDLE) ×1 IMPLANT
NDL SPNL 20GX3.5 QUINCKE YW (NEEDLE) IMPLANT
NEEDLE HYPO 25X1 1.5 SAFETY (NEEDLE) ×2 IMPLANT
NEEDLE SPNL 20GX3.5 QUINCKE YW (NEEDLE) ×2 IMPLANT
NS IRRIG 1000ML POUR BTL (IV SOLUTION) ×2 IMPLANT
OIL CARTRIDGE MAESTRO DRILL (MISCELLANEOUS) ×2
PACK LAMINECTOMY NEURO (CUSTOM PROCEDURE TRAY) ×2 IMPLANT
PAD ARMBOARD 7.5X6 YLW CONV (MISCELLANEOUS) ×6 IMPLANT
SPONGE SURGIFOAM ABS GEL 100 (HEMOSTASIS) ×1 IMPLANT
SPONGE SURGIFOAM ABS GEL SZ50 (HEMOSTASIS) IMPLANT
STRIP CLOSURE SKIN 1/2X4 (GAUZE/BANDAGES/DRESSINGS) ×2 IMPLANT
SUT VIC AB 0 CT1 18XCR BRD8 (SUTURE) ×1 IMPLANT
SUT VIC AB 0 CT1 8-18 (SUTURE) ×4
SUT VIC AB 2-0 CP2 18 (SUTURE) ×2 IMPLANT
SUT VIC AB 3-0 SH 8-18 (SUTURE) ×3 IMPLANT
TOWEL GREEN STERILE (TOWEL DISPOSABLE) ×2 IMPLANT
TOWEL GREEN STERILE FF (TOWEL DISPOSABLE) ×2 IMPLANT
WATER STERILE IRR 1000ML POUR (IV SOLUTION) ×2 IMPLANT

## 2019-09-16 NOTE — Progress Notes (Signed)
Pt's CBG taken by NT at 2103 was 406. MD notified and sliding scale with bedtime coverage was ordered. Short-acting insulin, Novolog administered at 2133 per scale. CBG was rechecked at 2306 and trended down to 319. Pt resting comfortably on bed with no acute complaints of pain or discomfort at this time. Will continue to monitor pt.

## 2019-09-16 NOTE — Anesthesia Postprocedure Evaluation (Signed)
Anesthesia Post Note  Patient: Jerry York  Procedure(s) Performed: Laminectomy and Foraminotomy - Lumbar Two-Lumbar Three - Lumbar Three-Lumbar Four - Lumbar Four-Lumbar Five - Lumbar Five-Sacral One (N/A Back)     Patient location during evaluation: PACU Anesthesia Type: General Level of consciousness: awake and alert and oriented Pain management: pain level controlled Vital Signs Assessment: post-procedure vital signs reviewed and stable Respiratory status: spontaneous breathing, nonlabored ventilation and respiratory function stable Cardiovascular status: blood pressure returned to baseline Postop Assessment: no apparent nausea or vomiting Anesthetic complications: no    Last Vitals:  Vitals:   09/16/19 1215 09/16/19 1305  BP: 115/73 125/78  Pulse: 68 69  Resp: 16 18  Temp: 36.4 C 36.5 C  SpO2: 94% 94%    Last Pain:  Vitals:   09/16/19 1305  TempSrc: Oral  PainSc:                  Brennan Bailey

## 2019-09-16 NOTE — H&P (Signed)
Subjective: Patient is a 68 y.o. male admitted for lumbar stenosis. Onset of symptoms was several months ago, gradually worsening since that time.  The pain is rated severe, and is located at the across the lower back and radiates to legs. The pain is described as aching and occurs all day. The symptoms have been progressive. Symptoms are exacerbated by exercise. MRI or CT showed stenosis   Past Medical History:  Diagnosis Date  . Anxiety   . Bilateral renal cysts   . Gout    02-22-2017 acute right foot gout---  per pt resolved  . Gout   . Gross hematuria   . Heart murmur   . History of BPH 08/14/2017  . Hydronephrosis, left   . Hyperlipidemia   . Hypertension   . Renal insufficiency   . Rheumatoid arthritis ([place])   . Swelling   . Type 2 diabetes mellitus ([place])   . Urinary retention   . Wears glasses     Past Surgical History:  Procedure Laterality Date  . APPENDECTOMY  age 30  . CYSTOSCOPY WITH URETEROSCOPY AND STENT PLACEMENT Left 05/08/2017   Procedure: URETEROSCOPY AND STENT PLACEMENT;  Surgeon: Kathie Rhodes, MD;  Location: [facility];  Service: Urology;  Laterality: Left;  . CYSTOSCOPY/RETROGRADE/URETEROSCOPY Bilateral 05/08/2017   Procedure: CYSTOSCOPY/ BILATERAL RETROGRADE;  Surgeon: Kathie Rhodes, MD;  Location[facility];  Service: Urology;  Laterality: Bilateral;  . INGUINAL HERNIA REPAIR Right 10/10/2000  . KNEE ARTHROSCOPY Right 1980's  . NASAL FRACTURE SURGERY     in highschool   . SHOULDER ARTHROSCOPY WITH OPEN ROTATOR CUFF REPAIR Right 1990's  . TRANSURETHRAL RESECTION OF PROSTATE      Prior to Admission medications   Medication Sig Start Date End Date Taking? Authorizing Provider  allopurinol (ZYLOPRIM) 100 MG tablet Take 3 tablets (300 mg total) by mouth daily. Patient taking differently: Take 100 mg by mouth 3 (three) times daily.  08/06/19  Yes Ofilia Neas, PA-C  amLODipine (NORVASC) 5 MG tablet Take 1 tablet (5 mg total) by  mouth daily. 06/03/19  Yes [name], MD  atorvastatin (LIPITOR) 40 MG tablet Take 1 tablet (40 mg total) by mouth daily. Patient taking differently: Take 40 mg by mouth at bedtime.  09/06/19  Yes [name], MD  [name] Take 500 mg by mouth 2 (two) times daily.    Yes [provider]  clonazePAM (KLONOPIN) 0.5 MG tablet Take 1 tablet (0.5 mg total) by mouth 2 (two) times daily. 06/03/19  Yes [name], MD  colchicine 0.6 MG tablet TAKE 1 TABLET BY MOUTH DAILY. TAKE 2 TIMES A DAY IF EXPERIENCING A FLARE Patient taking differently: Take 0.6 mg by mouth See admin instructions. Take 0.6 mg daily, may take twice daily if experiencing a gout flare 08/06/19  Yes Ofilia Neas, PA-C  furosemide (LASIX) 80 MG tablet Take 1 tablet (80 mg total) by mouth 2 (two) times daily. 03/06/19  Yes [name], MD  glucose blood ([name] TEST STRIPS) test strip Check blood sugars 2-3 times per day. DX: E11.9 09/01/17  Yes [name], MD  glucose monitoring kit ([name]) monitoring kit 1 each by Does not apply route as needed for other. DX Code: E11.9 06/11/18  Yes [name], MD  HUMULIN R U-500 KWIKPEN 500 UNIT/ML kwikpen Inject 60-80 Units as directed See admin instructions. INJECT 80 UNITS BEFORE BREAKFAST 60 UNITS BEFORE LUNCH AND 60 UNITS BEFORE EVENING MEAL.  [place] 08/12/19  Yes [provider]  insulin lispro (HUMALOG) 100 UNIT/ML injection Inject 30-80 Units into the skin 3 (three) times daily before meals.   Yes [provider]  metoprolol tartrate (LOPRESSOR) 100 MG tablet Take 1 tablet (100 mg total) by mouth 2 (two) times daily. 03/06/19  Yes [name], MD  traZODone (DESYREL) 100 MG tablet Take 2 tablets (200 mg total) by mouth at bedtime as needed for sleep. Patient taking differently: Take 200 mg by mouth at bedtime.  09/06/19  Yes [name], MD  Continuous Blood Gluc Sensor ([name] 14 DAY SENSOR) MISC Apply 1 kit  topically every 14 (fourteen) days. 04/08/19   [name], MD   Allergies  Allergen Reactions  . Sulfa Antibiotics Other (See Comments)    "make my feet burn"  . Azithromycin Nausea And Vomiting  . Metformin And Related Diarrhea    Social History   Tobacco Use  . Smoking status: Never Smoker  . Smokeless tobacco: Never Used  Substance Use Topics  . Alcohol use: No    Family History  Problem Relation Age of Onset  . Congestive Heart Failure Mother        Related to valve disease.  Opted to treat medically  . Hypertension Mother   . Hyperlipidemia Mother   . Diabetes Mother   . Hypertension Father   . Hyperlipidemia Father   . Coronary artery disease Father 37       History of CABG  . Stroke Father   . Hypertension Brother   . Diabetes Brother   . Hypertension Daughter   . Hypertension Son   . Hypertension Brother   . Hypertension Brother      Review of Systems  Positive ROS: neg  All other systems have been reviewed and were otherwise negative with the exception of those mentioned in the HPI and as above.  Objective: Vital signs in last 24 hours: Temp:  [98.4 F (36.9 C)] 98.4 F (36.9 C) (09/21 0627) Pulse Rate:  [64] 64 (09/21 0627) Resp:  [20] 20 (09/21 0627) BP: (145)/(78) 145/78 (09/21 0627) SpO2:  [96 %] 96 % (09/21 0627)  General Appearance: Alert, cooperative, no distress, appears stated age Head: Normocephalic, without obvious abnormality, atraumatic Eyes: PERRL, conjunctiva/corneas clear, EOM's intact    Neck: Supple, symmetrical, trachea midline Back: Symmetric, no curvature, ROM normal, no CVA tenderness Lungs:  respirations unlabored Heart: Regular rate and rhythm Abdomen: Soft, non-tender Extremities: Extremities normal, atraumatic, no cyanosis or edema Pulses: 2+ and symmetric all extremities Skin: Skin color, texture, turgor normal, no rashes or lesions  NEUROLOGIC:   Mental status: Alert and oriented x4,  no aphasia, good  attention span, fund of knowledge, and memory Motor Exam - grossly normal Sensory Exam - grossly normal Reflexes: 1+ Coordination - grossly normal Gait - grossly normal Balance - grossly normal Cranial Nerves: I: smell Not tested  II: visual acuity  OS: nl    OD: nl  II: visual fields Full to confrontation  II: pupils Equal, round, reactive to light  III,VII: ptosis None  III,IV,VI: extraocular muscles  Full ROM  V: mastication Normal  V: facial light touch sensation  Normal  V,VII: corneal reflex  Present  VII: facial muscle function - upper  Normal  VII: facial muscle function - lower Normal  VIII: hearing Not tested  IX: soft palate elevation  Normal  IX,X: gag reflex Present  XI: trapezius strength  5/5  XI: sternocleidomastoid strength 5/5  XI: neck flexion strength  5/5  XII: tongue strength  Normal    Data Review Lab Results  Component Value Date   WBC 3.9 (L) 09/12/2019   HGB 15.7 09/12/2019   HCT 43.8 09/12/2019   MCV 89.2 09/12/2019   PLT 127 (L) 09/12/2019   Lab Results  Component Value Date   NA 139 09/12/2019   K 4.2 09/12/2019   CL 101 09/12/2019   CO2 27 09/12/2019   BUN 35 (H) 09/12/2019   CREATININE 1.41 (H) 09/12/2019   GLUCOSE 187 (H) 09/12/2019   Lab Results  Component Value Date   INR 1.0 09/12/2019    Assessment/Plan:  Estimated body mass index is 31.75 kg/m as calculated from the following:   Height as of 09/12/19: _0  (1.651 m).   Weight as of 09/12/19: 86.5 kg. Patient admitted for LL L2-5. Patient has failed a reasonable attempt at conservative therapy.  I explained the condition and procedure to the patient and answered any questions.  Patient wishes to proceed with procedure as planned. Understands risks/ benefits and typical outcomes of procedure.   Eustace Moore 09/16/2019 7:25 AM

## 2019-09-16 NOTE — Transfer of Care (Signed)
Immediate Anesthesia Transfer of Care Note  Patient: Jerry York  Procedure(s) Performed: Laminectomy and Foraminotomy - Lumbar Two-Lumbar Three - Lumbar Three-Lumbar Four - Lumbar Four-Lumbar Five - Lumbar Five-Sacral One (N/A Back)  Patient Location: PACU  Anesthesia Type:General  Level of Consciousness: drowsy and patient cooperative  Airway & Oxygen Therapy: Patient Spontanous Breathing and Patient connected to face mask oxygen  Post-op Assessment: Report given to RN and Post -op Vital signs reviewed and stable  Post vital signs: Reviewed and stable  Last Vitals:  Vitals Value Taken Time  BP 118/80 09/16/19 1037  Temp    Pulse 69 09/16/19 1038  Resp 16 09/16/19 1038  SpO2 98 % 09/16/19 1038  Vitals shown include unvalidated device data.  Last Pain:  Vitals:   09/16/19 0627  TempSrc: Oral  PainSc: 0-No pain      Patients Stated Pain Goal: 3 (37/[id number])  Complications: No apparent anesthesia complications

## 2019-09-16 NOTE — Anesthesia Procedure Notes (Signed)
Procedure Name: Intubation Date/Time: 09/16/2019 7:43 AM Performed by: Orlie Dakin, CRNA Pre-anesthesia Checklist: Patient identified, Emergency Drugs available, Suction available and Patient being monitored Patient Re-evaluated:Patient Re-evaluated prior to induction Oxygen Delivery Method: Circle system utilized Preoxygenation: Pre-oxygenation with 100% oxygen Induction Type: IV induction Ventilation: Mask ventilation without difficulty Laryngoscope Size: Miller and 3 Grade View: Grade I Tube type: Oral Tube size: 7.5 mm Number of attempts: 1 Airway Equipment and Method: Stylet Placement Confirmation: ETT inserted through vocal cords under direct vision,  positive ETCO2 and breath sounds checked- equal and bilateral Secured at: 24 cm Tube secured with: Tape Dental Injury: Teeth and Oropharynx as per pre-operative assessment  Comments: Pre-op noted "rough" edges to upper front 2 teeth.   4x4s bite block used.

## 2019-09-16 NOTE — Op Note (Signed)
09/16/2019  10:37 AM  PATIENT:  Jerry York  68 y.o. male  PRE-OPERATIVE DIAGNOSIS:  Lumbar spinal stenosis with back and leg pain  POST-OPERATIVE DIAGNOSIS:  same  PROCEDURE: Decompressive lumbar laminectomy medial facetectomy and foraminotomy L2-3 L3-4 L4-5 L5-S1  SURGEON:  Sherley Bounds, MD  ASSISTANTS: Meyran FNP  ANESTHESIA:   General  EBL: 200 ml  Total I/O In: 1000 [I.V.:1000] Out: 200 [Blood:200]  BLOOD ADMINISTERED: none  DRAINS: medium hemovac  SPECIMEN:  none  INDICATION FOR PROCEDURE: This patient presented with severe back and bilateral leg pain. Imaging showed severe spinal stenosis L2-S1. The patient tried conservative measures without relief. Pain was debilitating. Recommended decompressive laminectomy. Patient understood the risks, benefits, and alternatives and potential outcomes and wished to proceed.  PROCEDURE DETAILS: The patient was taken to the operating room and after induction of adequate generalized endotracheal anesthesia, the patient was rolled into the prone position on the Wilson frame and all pressure points were padded. The lumbar region was cleaned and then prepped with DuraPrep and draped in the usual sterile fashion. 5 cc of local anesthesia was injected and then a dorsal midline incision was made and carried down to the lumbo sacral fascia. The fascia was opened and the paraspinous musculature was taken down in a subperiosteal fashion to expose L2-S1 bilaterally. Intraoperative x-ray confirmed my level, and then I used a combination of the high-speed drill and the Kerrison punches to perform a hemilaminectomy, medial facetectomy, and foraminotomy at L5-S1.  I also removed the spinous process of L3 and L4 and the inferior part of L2.  I did complete laminectomies medial facetectomies and foraminotomies at those 3 levels but hemilaminectomies at L5-S1.  The underlying yellow ligament was opened and removed in a piecemeal fashion to expose the  underlying dura and exiting nerve root. I undercut the lateral recess and dissected down until I was medial to and distal to the pedicle. The nerve root was well decompressed.  I then palpated with a coronary dilator along the nerve root and into the foramen to assure adequate decompression. I felt no more compression of the nerve root. I irrigated with saline solution containing bacitracin. Achieved hemostasis with bipolar cautery and Surgifoam, lined the dura with Gelfoam, placed a medium Hemovac drain through a separate stab incision, and then closed the fascia with 0 Vicryl. I closed the subcutaneous tissues with 2-0 Vicryl and the subcuticular tissues with 3-0 Vicryl. The skin was then closed with benzoin and Steri-Strips. The drapes were removed, a sterile dressing was applied. The patient was awakened from general anesthesia and transferred to the recovery room in stable condition. At the end of the procedure all sponge, needle and instrument counts were correct.    PLAN OF CARE: Admit to inpatient   PATIENT DISPOSITION:  PACU - hemodynamically stable.   Delay start of Pharmacological VTE agent (>24hrs) due to surgical blood loss or risk of bleeding:  yes

## 2019-09-17 ENCOUNTER — Other Ambulatory Visit: Payer: Self-pay

## 2019-09-17 ENCOUNTER — Encounter (HOSPITAL_COMMUNITY): Payer: Self-pay | Admitting: Neurological Surgery

## 2019-09-17 LAB — GLUCOSE, CAPILLARY
Glucose-Capillary: 274 mg/dL — ABNORMAL HIGH (ref 70–99)
Glucose-Capillary: 260 mg/dL — ABNORMAL HIGH (ref 70–99)
Glucose-Capillary: 250 mg/dL — ABNORMAL HIGH (ref 70–99)
Glucose-Capillary: 227 mg/dL — ABNORMAL HIGH (ref 70–99)

## 2019-09-17 MED ORDER — METHOCARBAMOL 500 MG PO TABS: 500.0000 mg | ORAL_TABLET | Freq: Four times a day (QID) | ORAL | 1 refills | Status: DC | PRN

## 2019-09-17 MED ORDER — OXYCODONE-ACETAMINOPHEN 5-325 MG PO TABS
2.0000 | ORAL_TABLET | ORAL | Status: DC | PRN
  Administered 2019-09-17 – 2019-09-18 (×7): 2 via ORAL
  Filled 2019-09-17 (×7): qty 2

## 2019-09-17 MED ORDER — OXYCODONE-ACETAMINOPHEN 10-325 MG PO TABS: 1.0000 | ORAL_TABLET | ORAL | 0 refills | Status: DC | PRN

## 2019-09-17 NOTE — Discharge Summary (Addendum)
Physician Discharge Summary  Patient ID: Jerry York MRN: [id number] DOB/AGE: 1951/01/18 68 y.o.  Admit date: 09/16/2019 Discharge date: 09/18/2019  Admission Diagnoses: lumbar stenosis    Discharge Diagnoses: same   Discharged Condition: good  Hospital Course: The patient was admitted on 09/16/2019 and taken to the operating room where the patient underwent LL L2-5. The patient tolerated the procedure well and was taken to the recovery room and then to the floor in stable condition. The hospital course was routine. There were no complications. The wound remained clean dry and intact. Pt had appropriate back soreness. No complaints of leg pain or new N/T/W. The patient remained afebrile with stable vital signs, and tolerated a regular diet. The patient continued to increase activities, and pain was well controlled with oral pain medications.   Consults: None  Significant Diagnostic Studies:  Results for orders placed or performed during the hospital encounter of 09/16/19  Glucose, capillary  Result Value Ref Range   Glucose-Capillary 184 (H) 70 - 99 mg/dL  Glucose, capillary  Result Value Ref Range   Glucose-Capillary 234 (H) 70 - 99 mg/dL   Comment 1 Notify RN    Comment 2 Document in Chart   Glucose, capillary  Result Value Ref Range   Glucose-Capillary 287 (H) 70 - 99 mg/dL  Glucose, capillary  Result Value Ref Range   Glucose-Capillary 437 (H) 70 - 99 mg/dL  Glucose, capillary  Result Value Ref Range   Glucose-Capillary 406 (H) 70 - 99 mg/dL   Comment 1 Notify RN    Comment 2 Document in Chart   Glucose, capillary  Result Value Ref Range   Glucose-Capillary 319 (H) 70 - 99 mg/dL  Glucose, capillary  Result Value Ref Range   Glucose-Capillary 274 (H) 70 - 99 mg/dL   Comment 1 Notify RN    Comment 2 Document in Chart   Glucose, capillary  Result Value Ref Range   Glucose-Capillary 260 (H) 70 - 99 mg/dL  Glucose, capillary  Result Value Ref Range    Glucose-Capillary 250 (H) 70 - 99 mg/dL  Glucose, capillary  Result Value Ref Range   Glucose-Capillary 227 (H) 70 - 99 mg/dL   Comment 1 Notify RN    Comment 2 Document in Chart   Glucose, capillary  Result Value Ref Range   Glucose-Capillary 233 (H) 70 - 99 mg/dL   Comment 1 Notify RN    Comment 2 Document in Chart   Glucose, capillary  Result Value Ref Range   Glucose-Capillary 243 (H) 70 - 99 mg/dL    Chest 2 View  Result Date: 09/13/2019 CLINICAL DATA:  Spinal stenosis.  Preoperative study. EXAM: CHEST - 2 VIEW COMPARISON:  Chest x-ray 09/05/2018. FINDINGS: Mediastinum hilar structures normal. Stable bibasilar subsegmental atelectasis/scarring. No acute infiltrate. No pleural effusion pneumothorax. Stable cardiomegaly. No pulmonary venous congestion. Degenerative change thoracic spine. IMPRESSION: 1. Stable bibasilar subsegmental atelectasis/scarring. No acute pulmonary abnormality. 2.  Stable cardiomegaly.  No pulmonary venous congestion. Electronically Signed   By: Marcello Moores  Register   On: 09/13/2019 06:18   Dg Lumbar Spine 1 View  Result Date: 09/16/2019 CLINICAL DATA:  Multilevel severe spinal stenosis. EXAM: LUMBAR SPINE - 1 VIEW COMPARISON:  Radiographs dated 08/13/2019 and lumbar MRI dated 07/25/2019 FINDINGS: Lateral image #1 of the lumbar spine demonstrates an instrument at the L5-S1 level. IMPRESSION: Instrument at L5-S1. Electronically Signed   By: Lorriane Shire M.D.   On: 09/16/2019 12:14    Antibiotics:  Anti-infectives (From admission, onward)  Start     Dose/Rate Route Frequency Ordered Stop   09/16/19 1300  ceFAZolin (ANCEF) IVPB 2g/100 mL premix     2 g 200 mL/hr over 30 Minutes Intravenous Every 8 hours 09/16/19 1258 09/16/19 2038   09/16/19 0822  bacitracin 50,000 Units in sodium chloride 0.9 % 500 mL irrigation  Status:  Discontinued       As needed 09/16/19 0823 09/16/19 1033   09/16/19 0600  ceFAZolin (ANCEF) IVPB 2g/100 mL premix     2 g 200 mL/hr over  30 Minutes Intravenous On call to O.R. 09/16/19 0556 09/16/19 0758      Discharge Exam: Blood pressure 119/69, pulse 87, temperature 98.6 F (37 C), temperature source Oral, resp. rate 19, height 5' 5"  (1.651 m), weight 86.6 kg, SpO2 97 %. Neurologic: Grossly normal Dressing dry  Discharge Medications:   Allergies as of 09/18/2019      Reactions   Sulfa Antibiotics Other (See Comments)   "make my feet burn"   Azithromycin Nausea And Vomiting   Metformin And Related Diarrhea      Medication List    TAKE these medications   allopurinol 100 MG tablet Commonly known as: ZYLOPRIM Take 3 tablets (300 mg total) by mouth daily. What changed:   how much to take  when to take this   amLODipine 5 MG tablet Commonly known as: NORVASC Take 1 tablet (5 mg total) by mouth daily.   atorvastatin 40 MG tablet Commonly known as: LIPITOR Take 1 tablet (40 mg total) by mouth daily. What changed: when to take this   [name] Take 500 mg by mouth 2 (two) times daily.   clonazePAM 0.5 MG tablet Commonly known as: KLONOPIN Take 1 tablet (0.5 mg total) by mouth 2 (two) times daily.   colchicine 0.6 MG tablet TAKE 1 TABLET BY MOUTH DAILY. TAKE 2 TIMES A DAY IF EXPERIENCING A FLARE What changed:   how much to take  how to take this  when to take this  additional instructions   cyclobenzaprine 5 MG tablet Commonly known as: FLEXERIL Take 1 tablet (5 mg total) by mouth 3 (three) times daily as needed for muscle spasms.   [name] 14 Day Sensor Misc Apply 1 kit topically every 14 (fourteen) days.   furosemide 80 MG tablet Commonly known as: LASIX Take 1 tablet (80 mg total) by mouth 2 (two) times daily.   glucose blood test strip Commonly known as: [name] TEST STRIPS Check blood sugars 2-3 times per day. DX: E11.9   glucose monitoring kit monitoring kit 1 each by Does not apply route as needed for other. DX Code: E11.9   HumuLIN R U-500 KwikPen 500 UNIT/ML  kwikpen Generic drug: insulin regular human CONCENTRATED Inject 60-80 Units as directed See admin instructions. INJECT 80 UNITS BEFORE BREAKFAST 60 UNITS BEFORE LUNCH AND 60 UNITS BEFORE EVENING MEAL. 30 MINUTES BEFORE MEALS   insulin lispro 100 UNIT/ML injection Commonly known as: HUMALOG Inject 30-80 Units into the skin 3 (three) times daily before meals.   methocarbamol 500 MG tablet Commonly known as: ROBAXIN Take 1 tablet (500 mg total) by mouth every 6 (six) hours as needed for muscle spasms.   metoprolol tartrate 100 MG tablet Commonly known as: LOPRESSOR Take 1 tablet (100 mg total) by mouth 2 (two) times daily.   oxyCODONE-acetaminophen 10-325 MG tablet Commonly known as: Percocet Take 1 tablet by mouth every 4 (four) hours as needed for pain.   oxyCODONE-acetaminophen 10-325 MG  tablet Commonly known as: Percocet Take 1 tablet by mouth every 4 (four) hours as needed for pain.   traZODone 100 MG tablet Commonly known as: DESYREL Take 2 tablets (200 mg total) by mouth at bedtime as needed for sleep. What changed: when to take this       Disposition: home   Final Dx: LL for stenosis  Discharge Instructions     Remove dressing in 72 hours   Complete by: As directed    Call MD for:  difficulty breathing, headache or visual disturbances   Complete by: As directed    Call MD for:  persistant nausea and vomiting   Complete by: As directed    Call MD for:  redness, tenderness, or signs of infection (pain, swelling, redness, odor or green/yellow discharge around incision site)   Complete by: As directed    Call MD for:  severe uncontrolled pain   Complete by: As directed    Call MD for:  temperature >100.4   Complete by: As directed    Diet - low sodium heart healthy   Complete by: As directed    Increase activity slowly   Complete by: As directed       Follow-up Information    Eustace Moore, MD. Schedule an appointment as soon as possible for a visit in 2  week(s).   Specialty: Neurosurgery Contact information: [address] [place] [address] [phone]            Signed: Ocie Cornfield [facility] 09/18/2019, 12:36 PM

## 2019-09-17 NOTE — Consult Note (Signed)
   [facility] Inpatient Consult   09/17/2019  Jerry York January 03, 1951 [id number]   [facility] Status: Showing as 'Active'  This patient is being followed by another care management team for [employer] which is currently [facility].  Will close active [facility] [facility] status and then sign off. All care team members are noted as 'not active'.  For questions, please contact:  Natividad Brood, RN BSN [facility] Liaison  [phone] business mobile phone Toll free office [phone]  Fax number: [phone] [name]

## 2019-09-17 NOTE — Evaluation (Signed)
Physical Therapy Evaluation Patient Details Name: Jerry York MRN: [id number] DOB: 1951/03/10 Today's Date: 09/17/2019   History of Present Illness  Pt is a 68 y/o male who presents s/p L2-S1 laminectomy/decompression on 09/16/2019. PMH significant for DMII, RA, renal insufficiency, HTN, heart murmur, gout, rotator cuff repair (R).   Clinical Impression  Pt admitted with above diagnosis. At the time of PT eval, pt was able to demonstrate transfers and ambulation with gross min guard assist for balance support and safety. Pt was educated on precautions, appropriate activity progression, and car transfer. Pt currently with functional limitations due to the deficits listed below (see PT Problem List). Pt will benefit from skilled PT to increase their independence and safety with mobility to allow discharge to the venue listed below.     Follow Up Recommendations No PT follow up;Supervision for mobility/OOB    Equipment Recommendations  3in1 (PT)    Recommendations for Other Services       Precautions / Restrictions Precautions Precautions: Fall;Back Precaution Booklet Issued: Yes (comment) Precaution Comments: Reviewed handout in detail and pt was cued for precautions during functional mobility.  Restrictions Weight Bearing Restrictions: No      Mobility  Bed Mobility Overal bed mobility: Needs Assistance Bed Mobility: Rolling;Sidelying to Sit Rolling: Modified independent (Device/Increase time) Sidelying to sit: Supervision       General bed mobility comments: VC's for proper log roll technique.   Transfers Overall transfer level: Needs assistance   Transfers: Sit to/from Stand Sit to Stand: Min guard         General transfer comment: Close guard for safety as pt powered up to full standing position. Pt was able to stand from EOB and toilet without assistance, however required increased time/effort.  Ambulation/Gait Ambulation/Gait assistance: Min guard Gait  Distance (Feet): 250 Feet Assistive device: Rolling walker (2 wheeled);None Gait Pattern/deviations: Step-through pattern;Decreased stride length;Trunk flexed;Narrow base of support Gait velocity: Decreased Gait velocity interpretation: <1.8 ft/sec, indicate of risk for recurrent falls General Gait Details: VC's for improved posture and general safety with ambulation. Pt was able to use no AD in the room however reaching for external support throughout. In hallway, pt appeared more comfortable with RW for balance support and energy conservation.   Stairs            Wheelchair Mobility    Modified Rankin (Stroke Patients Only)       Balance Overall balance assessment: Needs assistance Sitting-balance support: No upper extremity supported;Feet supported Sitting balance-Leahy Scale: Fair     Standing balance support: Bilateral upper extremity supported;During functional activity Standing balance-Leahy Scale: Poor Standing balance comment: RW or reaching for walls/foot board of bed for support                             Pertinent Vitals/Pain Pain Assessment: Faces Faces Pain Scale: Hurts even more Pain Location: Incision site Pain Descriptors / Indicators: Operative site guarding;Discomfort Pain Intervention(s): Limited activity within patient's tolerance;Monitored during session;Repositioned    Home Living Family/patient expects to be discharged to:: Private residence Living Arrangements: Spouse/significant other Available Help at Discharge: Family Type of Home: House Home Access: Ramped entrance     Home Layout: One level Home Equipment: Environmental consultant - 2 wheels      Prior Function Level of Independence: Independent               Hand Dominance        Extremity/Trunk Assessment  Upper Extremity Assessment Upper Extremity Assessment: Overall WFL for tasks assessed    Lower Extremity Assessment Lower Extremity Assessment: Generalized  weakness(Consistent with pre-op diagnosis)    Cervical / Trunk Assessment Cervical / Trunk Assessment: Other exceptions Cervical / Trunk Exceptions: s/p surgery  Communication   Communication: No difficulties  Cognition Arousal/Alertness: Awake/alert Behavior During Therapy: WFL for tasks assessed/performed Overall Cognitive Status: Within Functional Limits for tasks assessed                                        General Comments      Exercises     Assessment/Plan    PT Assessment Patient needs continued PT services  PT Problem List Decreased strength;Decreased activity tolerance;Decreased balance;Decreased mobility;Decreased knowledge of use of DME;Decreased safety awareness;Decreased knowledge of precautions;Pain       PT Treatment Interventions DME instruction;Gait training;Stair training;Functional mobility training;Therapeutic activities;Therapeutic exercise;Neuromuscular re-education;Patient/family education    PT Goals (Current goals can be found in the Care Plan section)  Acute Rehab PT Goals Patient Stated Goal: Home tomorrow with better pain control PT Goal Formulation: With patient Time For Goal Achievement: 09/24/19 Potential to Achieve Goals: Good    Frequency Min 5X/week   Barriers to discharge        Co-evaluation               AM-PAC PT "6 Clicks" Mobility  Outcome Measure Help needed turning from your back to your side while in a flat bed without using bedrails?: None Help needed moving from lying on your back to sitting on the side of a flat bed without using bedrails?: A Little Help needed moving to and from a bed to a chair (including a wheelchair)?: A Little Help needed standing up from a chair using your arms (e.g., wheelchair or bedside chair)?: A Little Help needed to walk in hospital room?: A Little Help needed climbing 3-5 steps with a railing? : A Little 6 Click Score: 19    End of Session Equipment Utilized  During Treatment: Gait belt Activity Tolerance: Patient tolerated treatment well Patient left: in chair Nurse Communication: Mobility status PT Visit Diagnosis: Unsteadiness on feet (R26.81);Pain;Other symptoms and signs involving the nervous system (R29.898) Pain - part of body: (back)    Time: 0626-9485 PT Time Calculation (min) (ACUTE ONLY): 29 min   Charges:   PT Evaluation $PT Eval Moderate Complexity: 1 Mod PT Treatments $Gait Training: 8-22 mins        Rolinda Roan, PT, DPT Acute Rehabilitation Services Pager: [phone] Office: [phone]   Thelma Comp 09/17/2019, 9:42 AM

## 2019-09-18 LAB — GLUCOSE, CAPILLARY
Glucose-Capillary: 233 mg/dL — ABNORMAL HIGH (ref 70–99)
Glucose-Capillary: 243 mg/dL — ABNORMAL HIGH (ref 70–99)

## 2019-09-18 MED ORDER — OXYCODONE-ACETAMINOPHEN 10-325 MG PO TABS: 1.0000 | ORAL_TABLET | ORAL | 0 refills | Status: DC | PRN

## 2019-09-18 MED ORDER — CYCLOBENZAPRINE HCL 5 MG PO TABS: 5.0000 mg | ORAL_TABLET | Freq: Three times a day (TID) | ORAL | 0 refills | Status: DC | PRN

## 2019-09-18 NOTE — Plan of Care (Signed)
Pt given D/C instructions with verbal understanding. Rx's were sent to pharmacy by MD. Pt's incision is clean and dry with no sign of infection. Pt's IV was removed prior to D/C. Pt refused 3-n-1 at D/C. Pt D/C'd home via wheelchair @ 1455 per MD order. Pt is stable @ D/C and has no other needs at this time. Holli Humbles, RN

## 2019-09-18 NOTE — Progress Notes (Signed)
Physical Therapy Treatment Patient Details Name: Jerry York MRN: [id number] DOB: 07-17-1951 Today's Date: 09/18/2019    History of Present Illness Pt is a 68 y/o male who presents s/p L2-S1 laminectomy/decompression on 09/16/2019. PMH significant for DMII, RA, renal insufficiency, HTN, heart murmur, gout, rotator cuff repair (R).     PT Comments    Pt progressing towards physical therapy goals. Was able to perform transfers and ambulation with increased time and up to min assist this session. Pt reports pain was elevated and was very reliant on RW for support during gait training. Pt anticipates d/c home today and we reinforced education regarding car transfer, activity progression, and positioning recommendations.     Follow Up Recommendations  No PT follow up;Supervision for mobility/OOB     Equipment Recommendations  3in1 (PT)    Recommendations for Other Services       Precautions / Restrictions Precautions Precautions: Fall;Back Precaution Booklet Issued: Yes (comment) Precaution Comments: Reviewed handout in detail and pt was cued for precautions during functional mobility.  Restrictions Weight Bearing Restrictions: No    Mobility  Bed Mobility Overal bed mobility: Needs Assistance Bed Mobility: Rolling;Sidelying to Sit Rolling: Supervision Sidelying to sit: Min assist       General bed mobility comments: Pt required assist to elevate LE's back up into bed at end of session. Pt reports pain is his biggest limiting fator.   Transfers Overall transfer level: Needs assistance Equipment used: Rolling walker (2 wheeled) Transfers: Sit to/from Stand Sit to Stand: Min guard         General transfer comment: Close guard for safety as pt powered up to full standing position. Pt was able to stand from EOB and toilet without assistance, however required increased time/effort.  Ambulation/Gait Ambulation/Gait assistance: Min guard Gait Distance (Feet): 200  Feet Assistive device: Rolling walker (2 wheeled);None Gait Pattern/deviations: Step-through pattern;Decreased stride length;Trunk flexed;Narrow base of support Gait velocity: Decreased Gait velocity interpretation: <1.8 ft/sec, indicate of risk for recurrent falls General Gait Details: VC's for improved posture and general safety with ambulation. Pt was able to use no AD in the room however reaching for external support throughout. In hallway, pt appeared more comfortable with RW for balance support and energy conservation.    Stairs             Wheelchair Mobility    Modified Rankin (Stroke Patients Only)       Balance Overall balance assessment: Needs assistance Sitting-balance support: No upper extremity supported;Feet supported Sitting balance-Leahy Scale: Fair     Standing balance support: Bilateral upper extremity supported;During functional activity Standing balance-Leahy Scale: Poor Standing balance comment: RW or reaching for walls/foot board of bed for support                            Cognition Arousal/Alertness: Awake/alert Behavior During Therapy: WFL for tasks assessed/performed Overall Cognitive Status: Within Functional Limits for tasks assessed                                        Exercises      General Comments        Pertinent Vitals/Pain Pain Assessment: Faces Faces Pain Scale: Hurts even more Pain Location: Incision site Pain Descriptors / Indicators: Operative site guarding;Discomfort Pain Intervention(s): Monitored during session;Limited activity within patient's tolerance;Repositioned    Home Living  Family/patient expects to be discharged to:: Private residence Living Arrangements: Spouse/significant other Available Help at Discharge: Family Type of Home: House Home Access: [place]: One [place]: Environmental consultant - 2 wheels      Prior Function Level of Independence:  Independent          PT Goals (current goals can now be found in the care plan section) Acute Rehab PT Goals Patient Stated Goal: Home tomorrow with better pain control PT Goal Formulation: With patient Time For Goal Achievement: 09/24/19 Potential to Achieve Goals: Good Progress towards PT goals: Progressing toward goals    Frequency    Min 5X/week      PT Plan Current plan remains appropriate    Co-evaluation              AM-PAC PT "6 Clicks" Mobility   Outcome Measure  Help needed turning from your back to your side while in a flat bed without using bedrails?: None Help needed moving from lying on your back to sitting on the side of a flat bed without using bedrails?: A Little Help needed moving to and from a bed to a chair (including a wheelchair)?: A Little Help needed standing up from a chair using your arms (e.g., wheelchair or bedside chair)?: A Little Help needed to walk in hospital room?: A Little Help needed climbing 3-5 steps with a railing? : A Little 6 Click Score: 19    End of Session Equipment Utilized During Treatment: Gait belt Activity Tolerance: Patient tolerated treatment well Patient left: in chair Nurse Communication: Mobility status PT Visit Diagnosis: Unsteadiness on feet (R26.81);Pain;Other symptoms and signs involving the nervous system (R29.898) Pain - part of body: (back)     Time: 9147-8295 PT Time Calculation (min) (ACUTE ONLY): 38 min  Charges:  $Gait Training: 38-52 mins                     Rolinda Roan, PT, DPT Acute Rehabilitation Services Pager: [phone] Office: [phone]    Thelma Comp 09/18/2019, 1:24 PM

## 2019-09-19 ENCOUNTER — Other Ambulatory Visit: Payer: Self-pay

## 2019-09-19 ENCOUNTER — Inpatient Hospital Stay (HOSPITAL_COMMUNITY)
Admission: EM | Admit: 2019-09-19 | Discharge: 2019-09-24 | DRG: 982 | Disposition: A | Discharge status: Rehabilitation Facility | Payer: PPO | Attending: Neurological Surgery | Admitting: Neurological Surgery

## 2019-09-19 ENCOUNTER — Encounter (HOSPITAL_COMMUNITY): Payer: Self-pay | Admitting: Emergency Medicine

## 2019-09-19 DIAGNOSIS — E1122 Type 2 diabetes mellitus with diabetic chronic kidney disease: Secondary | ICD-10-CM | POA: Diagnosis present

## 2019-09-19 DIAGNOSIS — Z79899 Other long term (current) drug therapy: Secondary | ICD-10-CM

## 2019-09-19 DIAGNOSIS — Z885 Allergy status to narcotic agent status: Secondary | ICD-10-CM | POA: Diagnosis not present

## 2019-09-19 DIAGNOSIS — D72819 Decreased white blood cell count, unspecified: Secondary | ICD-10-CM | POA: Diagnosis not present

## 2019-09-19 DIAGNOSIS — E1169 Type 2 diabetes mellitus with other specified complication: Secondary | ICD-10-CM | POA: Diagnosis not present

## 2019-09-19 DIAGNOSIS — Z833 Family history of diabetes mellitus: Secondary | ICD-10-CM

## 2019-09-19 DIAGNOSIS — R338 Other retention of urine: Secondary | ICD-10-CM | POA: Diagnosis present

## 2019-09-19 DIAGNOSIS — Z8249 Family history of ischemic heart disease and other diseases of the circulatory system: Secondary | ICD-10-CM | POA: Diagnosis not present

## 2019-09-19 DIAGNOSIS — I10 Essential (primary) hypertension: Secondary | ICD-10-CM | POA: Diagnosis not present

## 2019-09-19 DIAGNOSIS — F419 Anxiety disorder, unspecified: Secondary | ICD-10-CM | POA: Diagnosis present

## 2019-09-19 DIAGNOSIS — Z79891 Long term (current) use of opiate analgesic: Secondary | ICD-10-CM

## 2019-09-19 DIAGNOSIS — Z823 Family history of stroke: Secondary | ICD-10-CM | POA: Diagnosis not present

## 2019-09-19 DIAGNOSIS — E785 Hyperlipidemia, unspecified: Secondary | ICD-10-CM | POA: Diagnosis present

## 2019-09-19 DIAGNOSIS — G4700 Insomnia, unspecified: Secondary | ICD-10-CM | POA: Diagnosis not present

## 2019-09-19 DIAGNOSIS — Z794 Long term (current) use of insulin: Secondary | ICD-10-CM | POA: Diagnosis not present

## 2019-09-19 DIAGNOSIS — E669 Obesity, unspecified: Secondary | ICD-10-CM | POA: Diagnosis not present

## 2019-09-19 DIAGNOSIS — Z8349 Family history of other endocrine, nutritional and metabolic diseases: Secondary | ICD-10-CM

## 2019-09-19 DIAGNOSIS — R531 Weakness: Secondary | ICD-10-CM | POA: Diagnosis not present

## 2019-09-19 DIAGNOSIS — M48061 Spinal stenosis, lumbar region without neurogenic claudication: Secondary | ICD-10-CM | POA: Diagnosis not present

## 2019-09-19 DIAGNOSIS — G834 Cauda equina syndrome: Secondary | ICD-10-CM | POA: Diagnosis present

## 2019-09-19 DIAGNOSIS — M5126 Other intervertebral disc displacement, lumbar region: Secondary | ICD-10-CM | POA: Diagnosis not present

## 2019-09-19 DIAGNOSIS — M96840 Postprocedural hematoma of a musculoskeletal structure following a musculoskeletal system procedure: Secondary | ICD-10-CM | POA: Diagnosis not present

## 2019-09-19 DIAGNOSIS — N289 Disorder of kidney and ureter, unspecified: Secondary | ICD-10-CM

## 2019-09-19 DIAGNOSIS — Z881 Allergy status to other antibiotic agents status: Secondary | ICD-10-CM

## 2019-09-19 DIAGNOSIS — M069 Rheumatoid arthritis, unspecified: Secondary | ICD-10-CM | POA: Diagnosis present

## 2019-09-19 DIAGNOSIS — Z882 Allergy status to sulfonamides status: Secondary | ICD-10-CM

## 2019-09-19 DIAGNOSIS — N179 Acute kidney failure, unspecified: Secondary | ICD-10-CM | POA: Diagnosis present

## 2019-09-19 DIAGNOSIS — R52 Pain, unspecified: Secondary | ICD-10-CM | POA: Diagnosis not present

## 2019-09-19 DIAGNOSIS — Z9079 Acquired absence of other genital organ(s): Secondary | ICD-10-CM

## 2019-09-19 DIAGNOSIS — G9761 Postprocedural hematoma of a nervous system organ or structure following a nervous system procedure: Principal | ICD-10-CM | POA: Diagnosis present

## 2019-09-19 DIAGNOSIS — M5489 Other dorsalgia: Secondary | ICD-10-CM | POA: Diagnosis not present

## 2019-09-19 DIAGNOSIS — N401 Enlarged prostate with lower urinary tract symptoms: Secondary | ICD-10-CM | POA: Diagnosis present

## 2019-09-19 DIAGNOSIS — N182 Chronic kidney disease, stage 2 (mild): Secondary | ICD-10-CM | POA: Diagnosis present

## 2019-09-19 DIAGNOSIS — R339 Retention of urine, unspecified: Secondary | ICD-10-CM | POA: Diagnosis not present

## 2019-09-19 DIAGNOSIS — Z20828 Contact with and (suspected) exposure to other viral communicable diseases: Secondary | ICD-10-CM | POA: Diagnosis present

## 2019-09-19 DIAGNOSIS — Y838 Other surgical procedures as the cause of abnormal reaction of the patient, or of later complication, without mention of misadventure at the time of the procedure: Secondary | ICD-10-CM | POA: Diagnosis present

## 2019-09-19 DIAGNOSIS — K5900 Constipation, unspecified: Secondary | ICD-10-CM | POA: Diagnosis not present

## 2019-09-19 DIAGNOSIS — I129 Hypertensive chronic kidney disease with stage 1 through stage 4 chronic kidney disease, or unspecified chronic kidney disease: Secondary | ICD-10-CM | POA: Diagnosis present

## 2019-09-19 DIAGNOSIS — Z888 Allergy status to other drugs, medicaments and biological substances status: Secondary | ICD-10-CM | POA: Diagnosis not present

## 2019-09-19 DIAGNOSIS — Z9889 Other specified postprocedural states: Secondary | ICD-10-CM

## 2019-09-19 DIAGNOSIS — E876 Hypokalemia: Secondary | ICD-10-CM | POA: Diagnosis present

## 2019-09-19 DIAGNOSIS — M109 Gout, unspecified: Secondary | ICD-10-CM | POA: Diagnosis present

## 2019-09-19 DIAGNOSIS — Z4789 Encounter for other orthopedic aftercare: Secondary | ICD-10-CM | POA: Diagnosis not present

## 2019-09-19 DIAGNOSIS — Z03818 Encounter for observation for suspected exposure to other biological agents ruled out: Secondary | ICD-10-CM | POA: Diagnosis not present

## 2019-09-19 DIAGNOSIS — S064X9A Epidural hemorrhage with loss of consciousness of unspecified duration, initial encounter: Secondary | ICD-10-CM | POA: Diagnosis not present

## 2019-09-19 DIAGNOSIS — E119 Type 2 diabetes mellitus without complications: Secondary | ICD-10-CM | POA: Diagnosis not present

## 2019-09-19 NOTE — ED Triage Notes (Signed)
Patient arrived with EMS from home reports worsening low back pain this evening radiating to both legs with weakness ,  recent back surgery ( laminectomy) last 09/16/2019 .

## 2019-09-20 ENCOUNTER — Emergency Department (HOSPITAL_COMMUNITY): Payer: PPO | Admitting: Anesthesiology

## 2019-09-20 ENCOUNTER — Encounter (HOSPITAL_COMMUNITY)
Admission: EM | Disposition: A | Discharge status: Rehabilitation Facility | Payer: Self-pay | Source: Home / Self Care | Attending: Neurological Surgery

## 2019-09-20 ENCOUNTER — Encounter (HOSPITAL_COMMUNITY): Payer: Self-pay | Admitting: *Deleted

## 2019-09-20 ENCOUNTER — Emergency Department (HOSPITAL_COMMUNITY): Payer: PPO

## 2019-09-20 DIAGNOSIS — R339 Retention of urine, unspecified: Secondary | ICD-10-CM | POA: Diagnosis not present

## 2019-09-20 DIAGNOSIS — G4700 Insomnia, unspecified: Secondary | ICD-10-CM | POA: Diagnosis not present

## 2019-09-20 DIAGNOSIS — E876 Hypokalemia: Secondary | ICD-10-CM | POA: Diagnosis present

## 2019-09-20 DIAGNOSIS — Z8249 Family history of ischemic heart disease and other diseases of the circulatory system: Secondary | ICD-10-CM | POA: Diagnosis not present

## 2019-09-20 DIAGNOSIS — S064X9A Epidural hemorrhage with loss of consciousness of unspecified duration, initial encounter: Secondary | ICD-10-CM | POA: Diagnosis not present

## 2019-09-20 DIAGNOSIS — Z79899 Other long term (current) drug therapy: Secondary | ICD-10-CM | POA: Diagnosis not present

## 2019-09-20 DIAGNOSIS — F419 Anxiety disorder, unspecified: Secondary | ICD-10-CM | POA: Diagnosis present

## 2019-09-20 DIAGNOSIS — Z882 Allergy status to sulfonamides status: Secondary | ICD-10-CM | POA: Diagnosis not present

## 2019-09-20 DIAGNOSIS — E785 Hyperlipidemia, unspecified: Secondary | ICD-10-CM | POA: Diagnosis present

## 2019-09-20 DIAGNOSIS — Z823 Family history of stroke: Secondary | ICD-10-CM | POA: Diagnosis not present

## 2019-09-20 DIAGNOSIS — G834 Cauda equina syndrome: Secondary | ICD-10-CM | POA: Diagnosis present

## 2019-09-20 DIAGNOSIS — Z9889 Other specified postprocedural states: Secondary | ICD-10-CM | POA: Diagnosis not present

## 2019-09-20 DIAGNOSIS — Z888 Allergy status to other drugs, medicaments and biological substances status: Secondary | ICD-10-CM | POA: Diagnosis not present

## 2019-09-20 DIAGNOSIS — Z885 Allergy status to narcotic agent status: Secondary | ICD-10-CM | POA: Diagnosis not present

## 2019-09-20 DIAGNOSIS — Z4789 Encounter for other orthopedic aftercare: Secondary | ICD-10-CM | POA: Diagnosis not present

## 2019-09-20 DIAGNOSIS — I10 Essential (primary) hypertension: Secondary | ICD-10-CM | POA: Diagnosis not present

## 2019-09-20 DIAGNOSIS — G9761 Postprocedural hematoma of a nervous system organ or structure following a nervous system procedure: Secondary | ICD-10-CM | POA: Diagnosis present

## 2019-09-20 DIAGNOSIS — M069 Rheumatoid arthritis, unspecified: Secondary | ICD-10-CM | POA: Diagnosis present

## 2019-09-20 DIAGNOSIS — Z881 Allergy status to other antibiotic agents status: Secondary | ICD-10-CM | POA: Diagnosis not present

## 2019-09-20 DIAGNOSIS — E1122 Type 2 diabetes mellitus with diabetic chronic kidney disease: Secondary | ICD-10-CM | POA: Diagnosis present

## 2019-09-20 DIAGNOSIS — M96840 Postprocedural hematoma of a musculoskeletal structure following a musculoskeletal system procedure: Secondary | ICD-10-CM | POA: Diagnosis not present

## 2019-09-20 DIAGNOSIS — Z79891 Long term (current) use of opiate analgesic: Secondary | ICD-10-CM | POA: Diagnosis not present

## 2019-09-20 DIAGNOSIS — Z8349 Family history of other endocrine, nutritional and metabolic diseases: Secondary | ICD-10-CM | POA: Diagnosis not present

## 2019-09-20 DIAGNOSIS — Y838 Other surgical procedures as the cause of abnormal reaction of the patient, or of later complication, without mention of misadventure at the time of the procedure: Secondary | ICD-10-CM | POA: Diagnosis present

## 2019-09-20 DIAGNOSIS — E1169 Type 2 diabetes mellitus with other specified complication: Secondary | ICD-10-CM | POA: Diagnosis not present

## 2019-09-20 DIAGNOSIS — N179 Acute kidney failure, unspecified: Secondary | ICD-10-CM | POA: Diagnosis present

## 2019-09-20 DIAGNOSIS — N401 Enlarged prostate with lower urinary tract symptoms: Secondary | ICD-10-CM | POA: Diagnosis present

## 2019-09-20 DIAGNOSIS — Z9079 Acquired absence of other genital organ(s): Secondary | ICD-10-CM | POA: Diagnosis not present

## 2019-09-20 DIAGNOSIS — M109 Gout, unspecified: Secondary | ICD-10-CM | POA: Diagnosis present

## 2019-09-20 DIAGNOSIS — E119 Type 2 diabetes mellitus without complications: Secondary | ICD-10-CM | POA: Diagnosis not present

## 2019-09-20 DIAGNOSIS — Z20828 Contact with and (suspected) exposure to other viral communicable diseases: Secondary | ICD-10-CM | POA: Diagnosis present

## 2019-09-20 DIAGNOSIS — R338 Other retention of urine: Secondary | ICD-10-CM | POA: Diagnosis present

## 2019-09-20 DIAGNOSIS — Z794 Long term (current) use of insulin: Secondary | ICD-10-CM | POA: Diagnosis not present

## 2019-09-20 DIAGNOSIS — Z833 Family history of diabetes mellitus: Secondary | ICD-10-CM | POA: Diagnosis not present

## 2019-09-20 DIAGNOSIS — E669 Obesity, unspecified: Secondary | ICD-10-CM | POA: Diagnosis not present

## 2019-09-20 HISTORY — PX: HEMATOMA EVACUATION: SHX5118

## 2019-09-20 LAB — CBC WITH DIFFERENTIAL/PLATELET
Abs Immature Granulocytes: 0.04 10*3/uL (ref 0.00–0.07)
Basophils Absolute: 0 10*3/uL (ref 0.0–0.1)
Basophils Relative: 0 %
Eosinophils Absolute: 0 10*3/uL (ref 0.0–0.5)
Eosinophils Relative: 0 %
HCT: 39.4 % (ref 39.0–52.0)
Hemoglobin: 13.1 g/dL (ref 13.0–17.0)
Immature Granulocytes: 1 %
Lymphocytes Relative: 28 %
Lymphs Abs: 1.8 10*3/uL (ref 0.7–4.0)
MCH: 30 pg (ref 26.0–34.0)
MCHC: 33.2 g/dL (ref 30.0–36.0)
MCV: 90.2 fL (ref 80.0–100.0)
Monocytes Absolute: 0.5 10*3/uL (ref 0.1–1.0)
Monocytes Relative: 8 %
Neutro Abs: 4 10*3/uL (ref 1.7–7.7)
Neutrophils Relative %: 63 %
Platelets: 125 10*3/uL — ABNORMAL LOW (ref 150–400)
RBC: 4.37 MIL/uL (ref 4.22–5.81)
RDW: 16.3 % — ABNORMAL HIGH (ref 11.5–15.5)
WBC: 6.4 10*3/uL (ref 4.0–10.5)
nRBC: 0 % (ref 0.0–0.2)

## 2019-09-20 LAB — COMPREHENSIVE METABOLIC PANEL
ALT: 19 U/L (ref 0–44)
AST: 24 U/L (ref 15–41)
Albumin: 3.3 g/dL — ABNORMAL LOW (ref 3.5–5.0)
Alkaline Phosphatase: 57 U/L (ref 38–126)
Anion gap: 14 (ref 5–15)
BUN: 48 mg/dL — ABNORMAL HIGH (ref 8–23)
CO2: 27 mmol/L (ref 22–32)
Calcium: 8.6 mg/dL — ABNORMAL LOW (ref 8.9–10.3)
Chloride: 100 mmol/L (ref 98–111)
Creatinine, Ser: 1.72 mg/dL — ABNORMAL HIGH (ref 0.61–1.24)
GFR calc Af Amer: 47 mL/min — ABNORMAL LOW (ref >60)
GFR calc non Af Amer: 40 mL/min — ABNORMAL LOW (ref >60)
Glucose, Bld: 72 mg/dL (ref 70–99)
Potassium: 3.3 mmol/L — ABNORMAL LOW (ref 3.5–5.1)
Sodium: 141 mmol/L (ref 135–145)
Total Bilirubin: 0.6 mg/dL (ref 0.3–1.2)
Total Protein: 6.4 g/dL — ABNORMAL LOW (ref 6.5–8.1)

## 2019-09-20 LAB — URINALYSIS, ROUTINE W REFLEX MICROSCOPIC
Bilirubin Urine: NEGATIVE
Glucose, UA: NEGATIVE mg/dL
Hgb urine dipstick: NEGATIVE
Ketones, ur: NEGATIVE mg/dL
Leukocytes,Ua: NEGATIVE
Nitrite: NEGATIVE
Protein, ur: NEGATIVE mg/dL
Specific Gravity, Urine: 1.011 (ref 1.005–1.030)
pH: 5 (ref 5.0–8.0)

## 2019-09-20 LAB — MAGNESIUM: Magnesium: 2 mg/dL (ref 1.7–2.4)

## 2019-09-20 LAB — SARS CORONAVIRUS 2 BY RT PCR (HOSPITAL ORDER, PERFORMED IN ~~LOC~~ HOSPITAL LAB): SARS Coronavirus 2: NEGATIVE

## 2019-09-20 LAB — CK: Total CK: 258 U/L (ref 49–397)

## 2019-09-20 LAB — GLUCOSE, CAPILLARY
Glucose-Capillary: 151 mg/dL — ABNORMAL HIGH (ref 70–99)
Glucose-Capillary: 199 mg/dL — ABNORMAL HIGH (ref 70–99)
Glucose-Capillary: 235 mg/dL — ABNORMAL HIGH (ref 70–99)

## 2019-09-20 SURGERY — EVACUATION HEMATOMA
Anesthesia: General | Site: Back

## 2019-09-20 MED ORDER — OXYCODONE-ACETAMINOPHEN 10-325 MG PO TABS: 1.0000 | ORAL_TABLET | ORAL | Status: DC | PRN

## 2019-09-20 MED ORDER — THROMBIN 5000 UNITS EX SOLR
CUTANEOUS | Status: DC | PRN
  Administered 2019-09-20 (×2): 5000 [IU] via TOPICAL

## 2019-09-20 MED ORDER — FUROSEMIDE 80 MG PO TABS
80.0000 mg | ORAL_TABLET | Freq: Two times a day (BID) | ORAL | Status: DC
  Administered 2019-09-20 – 2019-09-24 (×8): 80 mg via ORAL
  Filled 2019-09-20 (×8): qty 1

## 2019-09-20 MED ORDER — BACITRACIN ZINC 500 UNIT/GM EX OINT
TOPICAL_OINTMENT | CUTANEOUS | Status: AC
  Filled 2019-09-20: qty 28.35

## 2019-09-20 MED ORDER — LIDOCAINE 2% (20 MG/ML) 5 ML SYRINGE
INTRAMUSCULAR | Status: AC
  Filled 2019-09-20: qty 5

## 2019-09-20 MED ORDER — DEXAMETHASONE SODIUM PHOSPHATE 4 MG/ML IJ SOLN: 2.0000 mg | Freq: Four times a day (QID) | INTRAMUSCULAR | Status: DC

## 2019-09-20 MED ORDER — CLONAZEPAM 0.5 MG PO TABS
0.5000 mg | ORAL_TABLET | Freq: Two times a day (BID) | ORAL | Status: DC
  Administered 2019-09-20 – 2019-09-24 (×8): 0.5 mg via ORAL
  Filled 2019-09-20 (×8): qty 1

## 2019-09-20 MED ORDER — SODIUM CHLORIDE 0.9 % IV SOLN
INTRAVENOUS | Status: DC | PRN
  Administered 2019-09-20: 07:00:00 25 ug/min via INTRAVENOUS

## 2019-09-20 MED ORDER — GADOBUTROL 1 MMOL/ML IV SOLN
9.0000 mL | Freq: Once | INTRAVENOUS | Status: AC | PRN
  Administered 2019-09-20: 02:00:00 9 mL via INTRAVENOUS

## 2019-09-20 MED ORDER — ALLOPURINOL 100 MG PO TABS
100.0000 mg | ORAL_TABLET | Freq: Three times a day (TID) | ORAL | Status: DC
  Administered 2019-09-20 – 2019-09-24 (×12): 100 mg via ORAL
  Filled 2019-09-20 (×12): qty 1

## 2019-09-20 MED ORDER — THROMBIN 5000 UNITS EX SOLR
OROMUCOSAL | Status: DC | PRN
  Administered 2019-09-20: 5 mL via TOPICAL

## 2019-09-20 MED ORDER — 0.9 % SODIUM CHLORIDE (POUR BTL) OPTIME
TOPICAL | Status: DC | PRN
  Administered 2019-09-20: 1000 mL

## 2019-09-20 MED ORDER — OXYCODONE HCL 5 MG PO TABS
5.0000 mg | ORAL_TABLET | ORAL | Status: DC | PRN
  Administered 2019-09-20 – 2019-09-21 (×2): 5 mg via ORAL
  Filled 2019-09-20 (×2): qty 1

## 2019-09-20 MED ORDER — FENTANYL CITRATE (PF) 100 MCG/2ML IJ SOLN
25.0000 ug | INTRAMUSCULAR | Status: DC | PRN
  Administered 2019-09-20 (×5): 25 ug via INTRAVENOUS

## 2019-09-20 MED ORDER — COLCHICINE 0.6 MG PO TABS
0.6000 mg | ORAL_TABLET | Freq: Every day | ORAL | Status: DC
  Administered 2019-09-21 – 2019-09-24 (×4): 0.6 mg via ORAL
  Filled 2019-09-20 (×4): qty 1

## 2019-09-20 MED ORDER — SUGAMMADEX SODIUM 200 MG/2ML IV SOLN
INTRAVENOUS | Status: DC | PRN
  Administered 2019-09-20: 200 mg via INTRAVENOUS

## 2019-09-20 MED ORDER — ONDANSETRON HCL 4 MG/2ML IJ SOLN
INTRAMUSCULAR | Status: DC | PRN
  Administered 2019-09-20: 4 mg via INTRAVENOUS

## 2019-09-20 MED ORDER — SODIUM CHLORIDE 0.9% FLUSH: 3.0000 mL | INTRAVENOUS | Status: DC | PRN

## 2019-09-20 MED ORDER — CHLORHEXIDINE GLUCONATE CLOTH 2 % EX PADS
6.0000 | MEDICATED_PAD | Freq: Every day | CUTANEOUS | Status: DC
  Administered 2019-09-20 – 2019-09-24 (×5): 6 via TOPICAL

## 2019-09-20 MED ORDER — MORPHINE SULFATE (PF) 2 MG/ML IV SOLN
2.0000 mg | INTRAVENOUS | Status: DC | PRN
  Administered 2019-09-20 – 2019-09-21 (×3): 2 mg via INTRAVENOUS
  Filled 2019-09-20 (×3): qty 1

## 2019-09-20 MED ORDER — ONDANSETRON HCL 4 MG PO TABS: 4.0000 mg | ORAL_TABLET | Freq: Four times a day (QID) | ORAL | Status: DC | PRN

## 2019-09-20 MED ORDER — OXYCODONE-ACETAMINOPHEN 5-325 MG PO TABS
1.0000 | ORAL_TABLET | ORAL | Status: DC | PRN
  Administered 2019-09-20 – 2019-09-21 (×2): 1 via ORAL
  Filled 2019-09-20 (×2): qty 1

## 2019-09-20 MED ORDER — PROPOFOL 10 MG/ML IV BOLUS
INTRAVENOUS | Status: DC | PRN
  Administered 2019-09-20: 175 mg via INTRAVENOUS

## 2019-09-20 MED ORDER — FENTANYL CITRATE (PF) 250 MCG/5ML IJ SOLN
INTRAMUSCULAR | Status: DC | PRN
  Administered 2019-09-20: 50 ug via INTRAVENOUS
  Administered 2019-09-20: 125 ug via INTRAVENOUS
  Administered 2019-09-20: 25 ug via INTRAVENOUS

## 2019-09-20 MED ORDER — HEMOSTATIC AGENTS (NO CHARGE) OPTIME
TOPICAL | Status: DC | PRN
  Administered 2019-09-20: 1 via TOPICAL

## 2019-09-20 MED ORDER — CEFAZOLIN SODIUM 1 G IJ SOLR
INTRAMUSCULAR | Status: AC
  Filled 2019-09-20: qty 20

## 2019-09-20 MED ORDER — LACTATED RINGERS IV SOLN
INTRAVENOUS | Status: DC | PRN
  Administered 2019-09-20 (×2): via INTRAVENOUS

## 2019-09-20 MED ORDER — CEFAZOLIN SODIUM-DEXTROSE 2-3 GM-%(50ML) IV SOLR
INTRAVENOUS | Status: DC | PRN
  Administered 2019-09-20: 2 g via INTRAVENOUS

## 2019-09-20 MED ORDER — FENTANYL CITRATE (PF) 250 MCG/5ML IJ SOLN
INTRAMUSCULAR | Status: AC
  Filled 2019-09-20: qty 5

## 2019-09-20 MED ORDER — DEXAMETHASONE 2 MG PO TABS
2.0000 mg | ORAL_TABLET | Freq: Four times a day (QID) | ORAL | Status: DC
  Filled 2019-09-20: qty 1

## 2019-09-20 MED ORDER — ONDANSETRON HCL 4 MG/2ML IJ SOLN
INTRAMUSCULAR | Status: AC
  Filled 2019-09-20: qty 2

## 2019-09-20 MED ORDER — POTASSIUM CHLORIDE IN NACL 20-0.9 MEQ/L-% IV SOLN
INTRAVENOUS | Status: DC
  Administered 2019-09-20 – 2019-09-21 (×3): via INTRAVENOUS
  Filled 2019-09-20 (×4): qty 1000

## 2019-09-20 MED ORDER — ONDANSETRON HCL 4 MG/2ML IJ SOLN: 4.0000 mg | Freq: Four times a day (QID) | INTRAMUSCULAR | Status: DC | PRN

## 2019-09-20 MED ORDER — THROMBIN 5000 UNITS EX SOLR
CUTANEOUS | Status: AC
  Filled 2019-09-20: qty 5000

## 2019-09-20 MED ORDER — CYCLOBENZAPRINE HCL 10 MG PO TABS
5.0000 mg | ORAL_TABLET | Freq: Three times a day (TID) | ORAL | Status: DC | PRN
  Administered 2019-09-21 – 2019-09-23 (×2): 5 mg via ORAL
  Filled 2019-09-20 (×2): qty 1

## 2019-09-20 MED ORDER — INSULIN ASPART 100 UNIT/ML ~~LOC~~ SOLN
0.0000 [IU] | Freq: Three times a day (TID) | SUBCUTANEOUS | Status: DC
  Administered 2019-09-20: 3 [IU] via SUBCUTANEOUS
  Administered 2019-09-21: 5 [IU] via SUBCUTANEOUS
  Administered 2019-09-21 (×2): 3 [IU] via SUBCUTANEOUS
  Administered 2019-09-22 – 2019-09-24 (×7): 11 [IU] via SUBCUTANEOUS
  Administered 2019-09-24: 12:00:00 8 [IU] via SUBCUTANEOUS

## 2019-09-20 MED ORDER — POTASSIUM CHLORIDE CRYS ER 20 MEQ PO TBCR
40.0000 meq | EXTENDED_RELEASE_TABLET | Freq: Once | ORAL | Status: AC
  Administered 2019-09-20: 03:00:00 40 meq via ORAL
  Filled 2019-09-20: qty 2

## 2019-09-20 MED ORDER — FENTANYL CITRATE (PF) 100 MCG/2ML IJ SOLN
INTRAMUSCULAR | Status: AC
  Filled 2019-09-20: qty 2

## 2019-09-20 MED ORDER — BUPIVACAINE-EPINEPHRINE (PF) 0.5% -1:200000 IJ SOLN
INTRAMUSCULAR | Status: AC
  Filled 2019-09-20: qty 30

## 2019-09-20 MED ORDER — ACETAMINOPHEN 325 MG PO TABS: 650.0000 mg | ORAL_TABLET | ORAL | Status: DC | PRN

## 2019-09-20 MED ORDER — PHENYLEPHRINE HCL (PRESSORS) 10 MG/ML IV SOLN
INTRAVENOUS | Status: DC | PRN
  Administered 2019-09-20: 80 ug via INTRAVENOUS
  Administered 2019-09-20 (×2): 40 ug via INTRAVENOUS
  Administered 2019-09-20 (×3): 80 ug via INTRAVENOUS

## 2019-09-20 MED ORDER — OXYCODONE HCL 5 MG PO TABS
ORAL_TABLET | ORAL | Status: AC
  Filled 2019-09-20: qty 1

## 2019-09-20 MED ORDER — SUCCINYLCHOLINE CHLORIDE 200 MG/10ML IV SOSY
PREFILLED_SYRINGE | INTRAVENOUS | Status: AC
  Filled 2019-09-20: qty 10

## 2019-09-20 MED ORDER — METOPROLOL TARTRATE 50 MG PO TABS
100.0000 mg | ORAL_TABLET | Freq: Two times a day (BID) | ORAL | Status: DC
  Administered 2019-09-20 – 2019-09-24 (×8): 100 mg via ORAL
  Filled 2019-09-20 (×8): qty 2

## 2019-09-20 MED ORDER — SUCCINYLCHOLINE 20MG/ML (10ML) SYRINGE FOR MEDFUSION PUMP - OPTIME
INTRAMUSCULAR | Status: DC | PRN
  Administered 2019-09-20: 140 mg via INTRAVENOUS

## 2019-09-20 MED ORDER — SODIUM CHLORIDE 0.9% FLUSH
3.0000 mL | Freq: Two times a day (BID) | INTRAVENOUS | Status: DC
  Administered 2019-09-20 – 2019-09-24 (×7): 3 mL via INTRAVENOUS

## 2019-09-20 MED ORDER — TRAZODONE HCL 100 MG PO TABS
200.0000 mg | ORAL_TABLET | Freq: Every evening | ORAL | Status: DC | PRN
  Administered 2019-09-21 – 2019-09-23 (×3): 200 mg via ORAL
  Filled 2019-09-20 (×3): qty 2

## 2019-09-20 MED ORDER — MIDAZOLAM HCL 2 MG/2ML IJ SOLN
INTRAMUSCULAR | Status: DC | PRN
  Administered 2019-09-20: 1 mg via INTRAVENOUS

## 2019-09-20 MED ORDER — PROPOFOL 10 MG/ML IV BOLUS
INTRAVENOUS | Status: AC
  Filled 2019-09-20: qty 20

## 2019-09-20 MED ORDER — MIDAZOLAM HCL 2 MG/2ML IJ SOLN
INTRAMUSCULAR | Status: AC
  Filled 2019-09-20: qty 2

## 2019-09-20 MED ORDER — SODIUM CHLORIDE 0.9 % IV SOLN: 250.0000 mL | INTRAVENOUS | Status: DC

## 2019-09-20 MED ORDER — ACETAMINOPHEN 650 MG RE SUPP: 650.0000 mg | RECTAL | Status: DC | PRN

## 2019-09-20 MED ORDER — MENTHOL 3 MG MT LOZG: 1.0000 | LOZENGE | OROMUCOSAL | Status: DC | PRN

## 2019-09-20 MED ORDER — ONDANSETRON HCL 4 MG/2ML IJ SOLN: 4.0000 mg | Freq: Once | INTRAMUSCULAR | Status: DC | PRN

## 2019-09-20 MED ORDER — OXYCODONE HCL 5 MG PO TABS
5.0000 mg | ORAL_TABLET | Freq: Once | ORAL | Status: AC | PRN
  Administered 2019-09-20: 5 mg via ORAL

## 2019-09-20 MED ORDER — SENNA 8.6 MG PO TABS
1.0000 | ORAL_TABLET | Freq: Two times a day (BID) | ORAL | Status: DC
  Administered 2019-09-20 – 2019-09-24 (×8): 8.6 mg via ORAL
  Filled 2019-09-20 (×8): qty 1

## 2019-09-20 MED ORDER — LIDOCAINE HCL (CARDIAC) PF 100 MG/5ML IV SOSY
PREFILLED_SYRINGE | INTRAVENOUS | Status: DC | PRN
  Administered 2019-09-20: 60 mg via INTRATRACHEAL

## 2019-09-20 MED ORDER — SODIUM CHLORIDE 0.9 % IV SOLN
INTRAVENOUS | Status: DC | PRN
  Administered 2019-09-20: 500 mL

## 2019-09-20 MED ORDER — PHENYLEPHRINE 40 MCG/ML (10ML) SYRINGE FOR IV PUSH (FOR BLOOD PRESSURE SUPPORT)
PREFILLED_SYRINGE | INTRAVENOUS | Status: AC
  Filled 2019-09-20: qty 10

## 2019-09-20 MED ORDER — OXYCODONE-ACETAMINOPHEN 5-325 MG PO TABS
2.0000 | ORAL_TABLET | Freq: Once | ORAL | Status: AC
  Administered 2019-09-20: 2 via ORAL
  Filled 2019-09-20: qty 2

## 2019-09-20 MED ORDER — THROMBIN 5000 UNITS EX SOLR
CUTANEOUS | Status: AC
  Filled 2019-09-20: qty 10000

## 2019-09-20 MED ORDER — ROCURONIUM BROMIDE 10 MG/ML (PF) SYRINGE
PREFILLED_SYRINGE | INTRAVENOUS | Status: AC
  Filled 2019-09-20: qty 10

## 2019-09-20 MED ORDER — PHENOL 1.4 % MT LIQD: 1.0000 | OROMUCOSAL | Status: DC | PRN

## 2019-09-20 MED ORDER — CEFAZOLIN SODIUM-DEXTROSE 2-4 GM/100ML-% IV SOLN
2.0000 g | Freq: Three times a day (TID) | INTRAVENOUS | Status: AC
  Administered 2019-09-20 (×2): 2 g via INTRAVENOUS
  Filled 2019-09-20 (×2): qty 100

## 2019-09-20 MED ORDER — AMLODIPINE BESYLATE 5 MG PO TABS
5.0000 mg | ORAL_TABLET | Freq: Every day | ORAL | Status: DC
  Administered 2019-09-20 – 2019-09-24 (×5): 5 mg via ORAL
  Filled 2019-09-20 (×5): qty 1

## 2019-09-20 MED ORDER — OXYCODONE HCL 5 MG/5ML PO SOLN: 5.0000 mg | Freq: Once | ORAL | Status: AC | PRN

## 2019-09-20 MED ORDER — ROCURONIUM 10MG/ML (10ML) SYRINGE FOR MEDFUSION PUMP - OPTIME
INTRAVENOUS | Status: DC | PRN
  Administered 2019-09-20: 50 mg via INTRAVENOUS

## 2019-09-20 SURGICAL SUPPLY — 56 items
ADH SKN CLS APL DERMABOND .7 (GAUZE/BANDAGES/DRESSINGS) ×1
APL SKNCLS STERI-STRIP NONHPOA (GAUZE/BANDAGES/DRESSINGS) ×1
BAG DECANTER FOR FLEXI CONT (MISCELLANEOUS) ×2 IMPLANT
BENZOIN TINCTURE PRP APPL 2/3 (GAUZE/BANDAGES/DRESSINGS) ×2 IMPLANT
BLADE CLIPPER SURG (BLADE) IMPLANT
BUR MATCHSTICK NEURO 3.0 LAGG (BURR) ×1 IMPLANT
BUR PRECISION FLUTE 6.0 (BURR) ×1 IMPLANT
CANISTER SUCT 3000ML PPV (MISCELLANEOUS) ×2 IMPLANT
CARTRIDGE OIL MAESTRO DRILL (MISCELLANEOUS) ×1 IMPLANT
COVER WAND RF STERILE (DRAPES) ×2 IMPLANT
DERMABOND ADVANCED (GAUZE/BANDAGES/DRESSINGS) ×1
DERMABOND ADVANCED .7 DNX12 (GAUZE/BANDAGES/DRESSINGS) IMPLANT
DIFFUSER DRILL AIR PNEUMATIC (MISCELLANEOUS) ×2 IMPLANT
DRAPE LAPAROTOMY 100X72X124 (DRAPES) ×2 IMPLANT
DRAPE MICROSCOPE LEICA (MISCELLANEOUS) ×1 IMPLANT
DRAPE POUCH INSTRU U-SHP 10X18 (DRAPES) ×2 IMPLANT
DRAPE SURG 17X23 STRL (DRAPES) ×8 IMPLANT
DRSG OPSITE POSTOP 4X6 (GAUZE/BANDAGES/DRESSINGS) ×1 IMPLANT
ELECT BLADE 4.0 EZ CLEAN MEGAD (MISCELLANEOUS) ×2
ELECT REM PT RETURN 9FT ADLT (ELECTROSURGICAL) ×2
ELECTRODE BLDE 4.0 EZ CLN MEGD (MISCELLANEOUS) ×1 IMPLANT
ELECTRODE REM PT RTRN 9FT ADLT (ELECTROSURGICAL) ×1 IMPLANT
EVACUATOR 1/8 PVC DRAIN (DRAIN) ×1 IMPLANT
GAUZE 4X4 16PLY RFD (DISPOSABLE) IMPLANT
GAUZE SPONGE 4X4 12PLY STRL (GAUZE/BANDAGES/DRESSINGS) ×2 IMPLANT
GLOVE BIO SURGEON STRL SZ 6.5 (GLOVE) ×2 IMPLANT
GLOVE BIO SURGEON STRL SZ7 (GLOVE) ×2 IMPLANT
GLOVE BIO SURGEON STRL SZ8 (GLOVE) ×2 IMPLANT
GLOVE BIO SURGEON STRL SZ8.5 (GLOVE) ×2 IMPLANT
GLOVE BIOGEL M 8.0 STRL (GLOVE) ×1 IMPLANT
GLOVE EXAM NITRILE XL STR (GLOVE) IMPLANT
GOWN STRL REUS W/ TWL LRG LVL3 (GOWN DISPOSABLE) IMPLANT
GOWN STRL REUS W/ TWL XL LVL3 (GOWN DISPOSABLE) ×1 IMPLANT
GOWN STRL REUS W/TWL 2XL LVL3 (GOWN DISPOSABLE) IMPLANT
GOWN STRL REUS W/TWL LRG LVL3 (GOWN DISPOSABLE) ×2
GOWN STRL REUS W/TWL XL LVL3 (GOWN DISPOSABLE) ×4
KIT BASIN OR (CUSTOM PROCEDURE TRAY) ×2 IMPLANT
KIT TURNOVER KIT B (KITS) ×2 IMPLANT
NDL HYPO 21X1.5 SAFETY (NEEDLE) IMPLANT
NEEDLE HYPO 21X1.5 SAFETY (NEEDLE) IMPLANT
NEEDLE HYPO 22GX1.5 SAFETY (NEEDLE) ×2 IMPLANT
NS IRRIG 1000ML POUR BTL (IV SOLUTION) ×2 IMPLANT
OIL CARTRIDGE MAESTRO DRILL (MISCELLANEOUS) ×2
PACK LAMINECTOMY NEURO (CUSTOM PROCEDURE TRAY) ×2 IMPLANT
PAD ARMBOARD 7.5X6 YLW CONV (MISCELLANEOUS) ×6 IMPLANT
PATTIES SURGICAL .5 X1 (DISPOSABLE) IMPLANT
RUBBERBAND STERILE (MISCELLANEOUS) ×2 IMPLANT
SPONGE SURGIFOAM ABS GEL 100 (HEMOSTASIS) ×1 IMPLANT
SPONGE SURGIFOAM ABS GEL SZ50 (HEMOSTASIS) ×2 IMPLANT
STRIP CLOSURE SKIN 1/2X4 (GAUZE/BANDAGES/DRESSINGS) ×2 IMPLANT
SUT VIC AB 1 CT1 18XBRD ANBCTR (SUTURE) ×2 IMPLANT
SUT VIC AB 1 CT1 8-18 (SUTURE) ×4
SUT VIC AB 2-0 CP2 18 (SUTURE) ×4 IMPLANT
TOWEL GREEN STERILE (TOWEL DISPOSABLE) ×2 IMPLANT
TOWEL GREEN STERILE FF (TOWEL DISPOSABLE) ×2 IMPLANT
WATER STERILE IRR 1000ML POUR (IV SOLUTION) ×2 IMPLANT

## 2019-09-20 NOTE — Transfer of Care (Signed)
Immediate Anesthesia Transfer of Care Note  Patient: Jerry York  Procedure(s) Performed: EVACUATION LUMBAR EPIDURAL HEMATOMA (N/A Back)  Patient Location: PACU  Anesthesia Type:General  Level of Consciousness: awake, alert , oriented and patient cooperative  Airway & Oxygen Therapy: Patient Spontanous Breathing and Patient connected to nasal cannula oxygen  Post-op Assessment: Report given to RN and Post -op Vital signs reviewed and stable  Post vital signs: Reviewed and stable  Last Vitals:  Vitals Value Taken Time  BP 130/79 09/20/19 0739  Temp    Pulse 110 09/20/19 0740  Resp 18 09/20/19 0740  SpO2 97 % 09/20/19 0740  Vitals shown include unvalidated device data.  Last Pain:  Vitals:   09/20/19 0411  TempSrc:   PainSc: 0-No pain         Complications: No apparent anesthesia complications

## 2019-09-20 NOTE — ED Notes (Signed)
ED TO INPATIENT HANDOFF REPORT  ED Nurse Name and Phone #:  Lucious Groves [phone]  I Name/Age/Gender Jerry York 68 y.o. male Room/Bed: 034C/034C  Code Status   Code Status: Prior  Home/SNF/Other Home {Patient oriented x4 Is this baseline? Yes  Triage Complete: Triage complete  Chief Complaint WEAKNESS GENERAL   Triage Note Patient arrived with EMS from home reports worsening low back pain this evening radiating to both legs with weakness ,  recent back surgery ( laminectomy) last 09/16/2019 .    Allergies Allergies  Allergen Reactions  . Sulfa Antibiotics Other (See Comments)    "make my feet burn"  . Azithromycin Nausea And Vomiting  . Metformin And Related Diarrhea    Level of Care/Admitting Diagnosis ED Disposition    None      B Medical/Surgery History Past Medical History:  Diagnosis Date  . Anxiety   . Bilateral renal cysts   . Gout    02-22-2017 acute right foot gout---  per pt resolved  . Gout   . Gross hematuria   . Heart murmur   . History of BPH 08/14/2017  . Hydronephrosis, left   . Hyperlipidemia   . Hypertension   . Renal insufficiency   . Rheumatoid arthritis (HCC)   . Swelling   . Type 2 diabetes mellitus (HCC)   . Urinary retention   . Wears glasses    Past Surgical History:  Procedure Laterality Date  . APPENDECTOMY  age 75  . CYSTOSCOPY WITH URETEROSCOPY AND STENT PLACEMENT Left 05/08/2017   Procedure: URETEROSCOPY AND STENT PLACEMENT;  Surgeon: Ihor Gully, MD;  Location: [facility];  Service: Urology;  Laterality: Left;  . CYSTOSCOPY/RETROGRADE/URETEROSCOPY Bilateral 05/08/2017   Procedure: CYSTOSCOPY/ BILATERAL RETROGRADE;  Surgeon: Ihor Gully, MD;  Location: [facility];  Service: Urology;  Laterality: Bilateral;  . INGUINAL HERNIA REPAIR Right 10/10/2000  . KNEE ARTHROSCOPY Right 1980's  . LUMBAR LAMINECTOMY/DECOMPRESSION MICRODISCECTOMY N/A 09/16/2019   Procedure: Laminectomy and  Foraminotomy - Lumbar Two-Lumbar Three - Lumbar Three-Lumbar Four - Lumbar Four-Lumbar Five - Lumbar Five-Sacral One;  Surgeon: Tia Alert, MD;  Location: [facility] OR;  Service: Neurosurgery;  Laterality: N/A;  Laminectomy and Foraminotomy - Lumbar Two-Lumbar Three - Lumbar Three-Lumbar Four - Lumbar Four-Lumbar Five - Lumbar Five-Sacral One  . NASAL FRACTURE SURGERY     in highschool   . SHOULDER ARTHROSCOPY WITH OPEN ROTATOR CUFF REPAIR Right 1990's  . TRANSURETHRAL RESECTION OF PROSTATE       A IV Location/Drains/Wounds Patient Lines/Drains/Airways Status   Active Line/Drains/Airways    Name:   Placement date:   Placement time:   Site:   Days:   Peripheral IV 09/20/19 Left Antecubital   09/20/19    0000    Antecubital   less than 1   Urethral Catheter R. Raydin Bielinski RN  Non-latex 16 Fr.   09/20/19    0046    Non-latex   less than 1   Ureteral Drain/Stent Left ureter 6 Fr.   05/08/17    0844    Left ureter   865   Incision (Closed) 05/08/17 Penis Other (Comment)   05/08/17    0907     865   Incision (Closed) 09/16/19 Back   09/16/19    1010     4   Wound / Incision (Open or Dehisced) 11/06/18 Other (Comment) Hand Right Skin tear   11/06/18    0809    Hand   318  Intake/Output Last 24 hours  Intake/Output Summary (Last 24 hours) at 09/20/2019 0330 Last data filed at 09/20/2019 0100 Gross per 24 hour  Intake -  Output 1900 ml  Net -1900 ml    Labs/Imaging Results for orders placed or performed during the hospital encounter of 09/19/19 (from the past 48 hour(s))  CBC with Differential     Status: Abnormal   Collection Time: 09/20/19 12:02 AM  Result Value Ref Range   WBC 6.4 4.0 - 10.5 K/uL   RBC 4.37 4.22 - 5.81 MIL/uL   Hemoglobin 13.1 13.0 - 17.0 g/dL   HCT 73.5 32.9 - 92.4 %   MCV 90.2 80.0 - 100.0 fL   MCH 30.0 26.0 - 34.0 pg   MCHC 33.2 30.0 - 36.0 g/dL   RDW 26.8 (H) 34.1 - 96.2 %   Platelets 125 (L) 150 - 400 K/uL    Comment: REPEATED TO VERIFY   nRBC 0.0 0.0  - 0.2 %   Neutrophils Relative % 63 %   Neutro Abs 4.0 1.7 - 7.7 K/uL   Lymphocytes Relative 28 %   Lymphs Abs 1.8 0.7 - 4.0 K/uL   Monocytes Relative 8 %   Monocytes Absolute 0.5 0.1 - 1.0 K/uL   Eosinophils Relative 0 %   Eosinophils Absolute 0.0 0.0 - 0.5 K/uL   Basophils Relative 0 %   Basophils Absolute 0.0 0.0 - 0.1 K/uL   Immature Granulocytes 1 %   Abs Immature Granulocytes 0.04 0.00 - 0.07 K/uL    Comment: Performed at [facility], [address] [place], [place] [address]  Comprehensive metabolic panel     Status: Abnormal   Collection Time: 09/20/19 12:02 AM  Result Value Ref Range   Sodium 141 135 - 145 mmol/L   Potassium 3.3 (L) 3.5 - 5.1 mmol/L   Chloride 100 98 - 111 mmol/L   CO2 27 22 - 32 mmol/L   Glucose, Bld 72 70 - 99 mg/dL   BUN 48 (H) 8 - 23 mg/dL   Creatinine, Ser 8.92 (H) 0.61 - 1.24 mg/dL   Calcium 8.6 (L) 8.9 - 10.3 mg/dL   Total Protein 6.4 (L) 6.5 - 8.1 g/dL   Albumin 3.3 (L) 3.5 - 5.0 g/dL   AST 24 15 - 41 U/L   ALT 19 0 - 44 U/L   Alkaline Phosphatase 57 38 - 126 U/L   Total Bilirubin 0.6 0.3 - 1.2 mg/dL   GFR calc non Af Amer 40 (L) >60 mL/min   GFR calc Af Amer 47 (L) >60 mL/min   Anion gap 14 5 - 15    Comment: Performed at [facility], [address] [place], [place] [address]  Magnesium     Status: None   Collection Time: 09/20/19 12:27 AM  Result Value Ref Range   Magnesium 2.0 1.7 - 2.4 mg/dL    Comment: Performed at [facility], [address], [place], [place] [address]  CK     Status: None   Collection Time: 09/20/19 12:27 AM  Result Value Ref Range   Total CK 258 49 - 397 U/L    Comment: Performed at [facility], [address] [place], [place] [address]  Urinalysis, Routine w reflex microscopic     Status: None   Collection Time: 09/20/19 12:28 AM  Result Value Ref Range   Color, Urine YELLOW YELLOW   APPearance CLEAR CLEAR   Specific Gravity, Urine 1.011 1.005 -  1.030   pH 5.0 5.0 - 8.0    Glucose, UA NEGATIVE NEGATIVE mg/dL   Hgb urine dipstick NEGATIVE NEGATIVE   Bilirubin Urine NEGATIVE NEGATIVE   Ketones, ur NEGATIVE NEGATIVE mg/dL   Protein, ur NEGATIVE NEGATIVE mg/dL   Nitrite NEGATIVE NEGATIVE   Leukocytes,Ua NEGATIVE NEGATIVE    Comment: Performed at [facility] [address], [place], [place] [address]   No results found.  Pending Labs [employer] (From admission, onward)    Start     Ordered   Pending  SARS Coronavirus 2 [facility] order, Performed in [facility] hospital lab) Nasopharyngeal Nasopharyngeal Swab  (Symptomatic/High Risk/Tier 1)  Once,   R    Question Answer Comment  Is this test for diagnosis or screening Diagnosis of ill patient   Symptomatic for COVID-19 as defined by CDC No   Hospitalized for COVID-19 No   Admitted to ICU for COVID-19 No   Previously tested for COVID-19 Yes   Resident in a congregate (group) care setting No   Employed in healthcare setting No      Pending          Vitals/Pain Today's Vitals   09/20/19 0115 09/20/19 0130 09/20/19 0235 09/20/19 0329  BP: 111/84 124/69 126/78   Pulse: (!) 106 (!) 104 (!) 105   Resp: 18 17 16    Temp:      TempSrc:      SpO2: 91% 90% 97%   PainSc:   Asleep 0-No pain    Isolation Precautions No active isolations  Medications Medications  potassium chloride SA (K-DUR) CR tablet 40 mEq (40 mEq Oral Given 09/20/19 0236)  gadobutrol (GADAVIST) 1 MMOL/ML injection 9 mL (9 mLs Intravenous Contrast Given 09/20/19 0220)  oxyCODONE-acetaminophen (PERCOCET/ROXICET) 5-325 MG per tablet 2 tablet (2 tablets Oral Given 09/20/19 0257)    Mobility walks with device Low fall risk   Focused Assessments No pain at this time .    R Recommendations: See Admitting Provider Note  Report given to:   Additional Notes:

## 2019-09-20 NOTE — Anesthesia Procedure Notes (Signed)
Procedure Name: Intubation Date/Time: 09/20/2019 6:08 AM Performed by: Valetta Fuller, CRNA Pre-anesthesia Checklist: Patient identified, Emergency Drugs available, Suction available and Patient being monitored Patient Re-evaluated:Patient Re-evaluated prior to induction Oxygen Delivery Method: Circle system utilized Preoxygenation: Pre-oxygenation with 100% oxygen Induction Type: IV induction, Rapid sequence and Cricoid Pressure applied Laryngoscope Size: Miller and 2 Grade View: Grade I Tube type: Oral Tube size: 7.5 mm Number of attempts: 1 Airway Equipment and Method: Stylet Placement Confirmation: ETT inserted through vocal cords under direct vision,  positive ETCO2 and breath sounds checked- equal and bilateral Secured at: 21 cm Tube secured with: Tape Dental Injury: Teeth and Oropharynx as per pre-operative assessment

## 2019-09-20 NOTE — ED Provider Notes (Signed)
[facility] EMERGENCY DEPARTMENT Provider Note   CSN: [id number] Arrival date & time: 09/19/19  2341    History   Chief Complaint Chief Complaint  Patient presents with  . Back Pain    S/P Laminectomy    HPI Jerry York is a 68 y.o. male.   The history is provided by the patient.  Back Pain He has history of hypertension, hyperlipidemia, diabetes, rheumatoid arthritis, chronic kidney disease and is status post lumbar laminectomy on September 21.  He was discharged from the hospital on September 23.  Since going home, he has noted that he is not able to stand and that he is not able to use his arms to support him when he stands.  He was ambulatory in the hospital.  Symptoms have been basically stable.  He also has not had a bowel movement since discharge from the hospital.  He has had decreased urination and has not urinated since about 4PM.  He denies numbness or tingling.  He denies fever or chills.  He is only having mild to moderate pain at the site of his incision.  Past Medical History:  Diagnosis Date  . Anxiety   . Bilateral renal cysts   . Gout    02-22-2017 acute right foot gout---  per pt resolved  . Gout   . Gross hematuria   . Heart murmur   . History of BPH 08/14/2017  . Hydronephrosis, left   . Hyperlipidemia   . Hypertension   . Renal insufficiency   . Rheumatoid arthritis ([place])   . Swelling   . Type 2 diabetes mellitus ([place])   . Urinary retention   . Wears glasses     Patient Active Problem List   Diagnosis Date Noted  . S/P lumbar laminectomy 09/16/2019  . Lower extremity edema 12/11/2017  . Family history of premature CAD 11/27/2017  . 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 Type 2 diabetes mellitus ([place]) 08/09/2017  . Gout 08/09/2017  . Anxiety with insomnia 08/09/2017  . Hyperlipidemia associated with type 2 diabetes mellitus ([place]) 08/08/2017  . Hypertension associated with diabetes  ([place]) 08/08/2017    Past Surgical History:  Procedure Laterality Date  . APPENDECTOMY  age 7  . CYSTOSCOPY WITH URETEROSCOPY AND STENT PLACEMENT Left 05/08/2017   Procedure: URETEROSCOPY AND STENT PLACEMENT;  Surgeon: Kathie Rhodes, MD;  Location: [facility];  Service: Urology;  Laterality: Left;  . CYSTOSCOPY/RETROGRADE/URETEROSCOPY Bilateral 05/08/2017   Procedure: CYSTOSCOPY/ BILATERAL RETROGRADE;  Surgeon: Kathie Rhodes, MD;  Location: [facility];  Service: Urology;  Laterality: Bilateral;  . INGUINAL HERNIA REPAIR Right 10/10/2000  . KNEE ARTHROSCOPY Right 1980's  . LUMBAR LAMINECTOMY/DECOMPRESSION MICRODISCECTOMY N/A 09/16/2019   Procedure: Laminectomy and Foraminotomy - Lumbar Two-Lumbar Three - Lumbar Three-Lumbar Four - Lumbar Four-Lumbar Five - Lumbar Five-Sacral One;  Surgeon: Eustace Moore, MD;  Location: [facility] OR;  Service: Neurosurgery;  Laterality: N/A;  Laminectomy and Foraminotomy - Lumbar Two-Lumbar Three - Lumbar Three-Lumbar Four - Lumbar Four-Lumbar Five - Lumbar Five-Sacral One  . NASAL FRACTURE SURGERY     in highschool   . SHOULDER ARTHROSCOPY WITH OPEN ROTATOR CUFF REPAIR Right 1990's  . TRANSURETHRAL RESECTION OF PROSTATE          Home Medications    Prior to Admission medications   Medication Sig Start Date End Date Taking? Authorizing Provider  allopurinol (ZYLOPRIM) 100 MG tablet Take 3 tablets (300 mg total) by mouth  daily. Patient taking differently: Take 100 mg by mouth 3 (three) times daily.  08/06/19   Ofilia Neas, PA-C  amLODipine (NORVASC) 5 MG tablet Take 1 tablet (5 mg total) by mouth daily. 06/03/19   [name], MD  atorvastatin (LIPITOR) 40 MG tablet Take 1 tablet (40 mg total) by mouth daily. Patient taking differently: Take 40 mg by mouth at bedtime.  09/06/19   [name], MD  [name] Take 500 mg by mouth 2 (two) times daily.     [provider]  clonazePAM (KLONOPIN) 0.5 MG tablet Take 1  tablet (0.5 mg total) by mouth 2 (two) times daily. 06/03/19   [name], MD  colchicine 0.6 MG tablet TAKE 1 TABLET BY MOUTH DAILY. TAKE 2 TIMES A DAY IF EXPERIENCING A FLARE Patient taking differently: Take 0.6 mg by mouth See admin instructions. Take 0.6 mg daily, may take twice daily if experiencing a gout flare 08/06/19   Ofilia Neas, PA-C  Continuous Blood Gluc Sensor ([name] 14 DAY SENSOR) MISC Apply 1 kit topically every 14 (fourteen) days. 04/08/19   [name], MD  cyclobenzaprine (FLEXERIL) 5 MG tablet Take 1 tablet (5 mg total) by mouth 3 (three) times daily as needed for muscle spasms. 09/18/19   Meyran, Ocie Cornfield, NP  furosemide (LASIX) 80 MG tablet Take 1 tablet (80 mg total) by mouth 2 (two) times daily. 03/06/19   [name], MD  glucose blood ([name] TEST STRIPS) test strip Check blood sugars 2-3 times per day. DX: E11.9 09/01/17   [name], MD  glucose monitoring kit ([name]) monitoring kit 1 each by Does not apply route as needed for other. DX Code: E11.9 06/11/18   [name], MD  HUMULIN R U-500 KWIKPEN 500 UNIT/ML kwikpen Inject 60-80 Units as directed See admin instructions. INJECT 80 UNITS BEFORE BREAKFAST 60 UNITS BEFORE LUNCH AND 60 UNITS BEFORE EVENING MEAL. [place] 08/12/19   [provider]  insulin lispro (HUMALOG) 100 UNIT/ML injection Inject 30-80 Units into the skin 3 (three) times daily before meals.    [provider]  methocarbamol (ROBAXIN) 500 MG tablet Take 1 tablet (500 mg total) by mouth every 6 (six) hours as needed for muscle spasms. 09/17/19   Eustace Moore, MD  metoprolol tartrate (LOPRESSOR) 100 MG tablet Take 1 tablet (100 mg total) by mouth 2 (two) times daily. 03/06/19   [name], MD  oxyCODONE-acetaminophen (PERCOCET) 10-325 MG tablet Take 1 tablet by mouth every 4 (four) hours as needed for pain. 09/17/19 09/16/20  Eustace Moore, MD  oxyCODONE-acetaminophen (PERCOCET)  10-325 MG tablet Take 1 tablet by mouth every 4 (four) hours as needed for pain. 09/18/19 09/17/20  Meyran, Ocie Cornfield, NP  traZODone (DESYREL) 100 MG tablet Take 2 tablets (200 mg total) by mouth at bedtime as needed for sleep. Patient taking differently: Take 200 mg by mouth at bedtime.  09/06/19   [name], MD    Family History Family History  Problem Relation Age of Onset  . Congestive Heart Failure Mother        Related to valve disease.  Opted to treat medically  . Hypertension Mother   . Hyperlipidemia Mother   . Diabetes Mother   . Hypertension Father   . Hyperlipidemia Father   . Coronary artery disease Father 46       History of CABG  . Stroke Father   .  Hypertension Brother   . Diabetes Brother   . Hypertension Daughter   . Hypertension Son   . Hypertension Brother   . Hypertension Brother     Social History Social History   Tobacco Use  . Smoking status: Never Smoker  . Smokeless tobacco: Never Used  Substance Use Topics  . Alcohol use: No  . Drug use: No     Allergies   Sulfa antibiotics, Azithromycin, and Metformin and related   Review of Systems Review of Systems  Musculoskeletal: Positive for back pain.  All other systems reviewed and are negative.    Physical Exam Updated Vital Signs BP 124/71 (BP Location: Right Arm)   Pulse (!) 112   Temp 98.8 F (37.1 C) (Oral)   Resp 19   SpO2 93%   Physical Exam Vitals signs and nursing note reviewed.    68 year old male, resting comfortably and in no acute distress. Vital signs are significant for elevated heart rate. Oxygen saturation is 93%, which is normal. Head is normocephalic and atraumatic. PERRLA, EOMI. Oropharynx is clear. Neck is nontender and supple without adenopathy or JVD. Lungs are clear without rales, wheezes, or rhonchi. Chest is nontender. Heart has regular rate and rhythm without murmur. Abdomen is soft, flat, nontender without masses or hepatosplenomegaly and  peristalsis is normoactive. Extremities have 2+ edema with moderate venous stasis changes bilaterally, full range of motion is present. Skin is warm and dry without rash. Neurologic: Mental status is normal, cranial nerves are intact.  Upper extremity has normal strength on handgrip and elbow extension but weakness of elbow flexion 3+/5.  Lower extremities have normal strength of dorsiflexion plantarflexion of the ankle, mild weakness of hip flexion, knee extension, knee flexion with strength in those locations 4/5 but slightly stronger on the left than on the right.  There are no sensory deficits.  ED Treatments / Results  Labs (all labs ordered are listed, but only abnormal results are displayed) Labs Reviewed  CBC WITH DIFFERENTIAL/PLATELET - Abnormal; Notable for the following components:      Result Value   RDW 16.3 (*)    Platelets 125 (*)    All other components within normal limits  COMPREHENSIVE METABOLIC PANEL - Abnormal; Notable for the following components:   Potassium 3.3 (*)    BUN 48 (*)    Creatinine, Ser 1.72 (*)    Calcium 8.6 (*)    Total Protein 6.4 (*)    Albumin 3.3 (*)    GFR calc non Af Amer 40 (*)    GFR calc Af Amer 47 (*)    All other components within normal limits  SARS CORONAVIRUS 2 (HOSPITAL ORDER, [facility])  MAGNESIUM  CK  URINALYSIS, ROUTINE W REFLEX MICROSCOPIC    Radiology Mr Lumbar Spine W Wo Contrast  Result Date: 09/20/2019 CLINICAL DATA:  Initial evaluation for worsening back pain with lower extremity weakness, recent surgery. EXAM: MRI LUMBAR SPINE WITHOUT AND WITH CONTRAST TECHNIQUE: Multiplanar and multiecho pulse sequences of the lumbar spine were obtained without and with intravenous contrast. CONTRAST:  19m GADAVIST GADOBUTROL 1 MMOL/ML IV SOLN COMPARISON:  Prior MRI from 07/25/2019. FINDINGS: Segmentation: Standard. Lowest well-formed disc labeled the L5-S1 level. Alignment: Trace anterolisthesis of L5 on S1,  stable. Alignment otherwise normal with preservation of the normal lumbar lordosis. Vertebrae: Chronic height loss at the superior endplate of TQ75with associated Schmorl's node deformity, stable. Vertebral body height otherwise maintained without acute or interval fracture. Bone  marrow signal intensity mildly heterogeneous but within normal limits. Benign hemangioma noted within the L1 vertebral body. No worrisome osseous lesions. Postoperative changes from recent decompressive laminectomy at L2-3 through L5-S1. No evidence for osteomyelitis discitis. Conus medullaris and cauda equina: Conus extends to the inferior aspect of the L1 level. Conus medullaris within normal limits. Nerve roots of the cauda equina are compressed anteriorly by a posterior intradural or subdural collection extending from L2-3 through L4-5. Collection is positioned at the left posterolateral aspect of the thecal sac, and measures 1.2 x 1.6 x 8.0 cm in greatest dimensions (AP by transverse by craniocaudad). This is positioned at the site of recent laminectomy, and suspicious for a possible CSF leak with subsequent collection. Paraspinal and other soft tissues: Postoperative changes from recent laminectomy present within the posterior paraspinous soft tissues. Small fairly linear T2 hyperintense collection seen along the lower midline incision measures 3.9 x 9.5 cm (series 8, image 10). 4.9 cm simple cyst within the right retroperitoneum likely reflects a partially visualized exophytic right renal cyst. Visualized visceral structures otherwise unremarkable. Disc levels: L1-2: Disc desiccation with mild disc bulge, asymmetric to the right. Mild right worse than left facet hypertrophy. No stenosis. L2-3: Mild disc bulging. Prior posterior decompression. Collection at the posterior thecal sac with secondary compression of the thecal sac anteriorly. Severe stenosis with the thecal sac measuring 4 mm in AP diameter. Residual bilateral facet  hypertrophy. Foramina remain patent. L3-4: Diffuse disc bulge. Interval laminectomy. Posterior collection with compression of the thecal sac anteriorly and resultant severe spinal stenosis. Moderate facet hypertrophy with associated joint effusion on the left. Moderate bilateral L3 foraminal stenosis. L4-5: Disc desiccation without disc bulge. Decompressive laminectomy. Collection at the posterior thecal sac with resultant severe stenosis. Residual facet hypertrophy. Foramina remain patent. L5-S1: Trace anterolisthesis. Mild disc bulge. Decompressive laminectomy. 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 No stenosis. IMPRESSION: Postoperative changes from recent laminectomy at L2-3 through L5-S1 with posterior intradural or subdural collection extending from L2-3 through L4-5, suspicious for a possible CSF leak with subsequent collection. Resultant severe diffuse spinal stenosis with compression of the nerve roots of the cauda equina. Emergent neuro surgical consultation recommended. Critical Value/emergent results were called by telephone at the time of interpretation on 09/20/2019 at 3:23 am to [place] , who verbally acknowledged these results. Electronically Signed   By: Jeannine Boga M.D.   On: 09/20/2019 03:28    Procedures Procedures  Medications Ordered in ED Medications  potassium chloride SA (K-DUR) CR tablet 40 mEq (40 mEq Oral Given 09/20/19 0236)  gadobutrol (GADAVIST) 1 MMOL/ML injection 9 mL (9 mLs Intravenous Contrast Given 09/20/19 0220)  oxyCODONE-acetaminophen (PERCOCET/ROXICET) 5-325 MG per tablet 2 tablet (2 tablets Oral Given 09/20/19 0257)     Initial Impression / Assessment and Plan / ED Course  I have reviewed the triage vital signs and the nursing notes.  Pertinent labs & imaging results that were available during my care of the patient were reviewed by me and considered in my medical decision making (see chart for details).  Leg  weakness status post lumbar surgery.  Old records reviewed confirming recent lumbar laminectomy.  Labs do show evidence of acute kidney injury with creatinine 1.72 compared with 1.41 on September 17.  Also, mild hypokalemia and he is given a dose of oral potassium.  Will check magnesium and CK.  Will check bladder scan to see if he has urinary retention, and check urinalysis to look for UTI.  Will  send for MRI of lumbar spine to make sure no postoperative complications.  Bladder scan shows greater than 700 mL in the bladder.  Foley catheter is placed.  MRI shows compression of cauda equina from fluid collection which probably is a CSF leak.  I have discussed these findings directly with the radiologist.  Case was then discussed with Viona Gilmore nurse practitioner with [place] [facility] who states that she will have Dr. Arnoldo Morale review the MRI scan.  Dr. Arnoldo Morale has reviewed the MRI scan and is he making arrangements to take the patient to the operating room to evacuate the fluid collection.  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

## 2019-09-20 NOTE — Anesthesia Preprocedure Evaluation (Addendum)
Anesthesia Evaluation  Patient identified by MRN, date of birth, ID band Patient awake    Reviewed: Allergy & Precautions, NPO status , Patient's Chart, lab work & pertinent test results  Airway Mallampati: III  TM Distance: >3 FB Neck ROM: Full    Dental  (+) Teeth Intact, Dental Advisory Given   Pulmonary    breath sounds clear to auscultation       Cardiovascular hypertension,  Rhythm:Regular Rate:Normal     Neuro/Psych    GI/Hepatic   Endo/Other  diabetes  Renal/GU      Musculoskeletal   Abdominal   Peds  Hematology   Anesthesia Other Findings   Reproductive/Obstetrics                            Anesthesia Physical Anesthesia Plan  ASA: III  Anesthesia Plan: General   Post-op Pain Management:    Induction: Intravenous, Rapid sequence and Cricoid pressure planned  PONV Risk Score and Plan: Ondansetron  Airway Management Planned: Oral ETT  Additional Equipment:   Intra-op Plan:   Post-operative Plan: Extubation in OR  Informed Consent: I have reviewed the patients History and Physical, chart, labs and discussed the procedure including the risks, benefits and alternatives for the proposed anesthesia with the patient or authorized representative who has indicated his/her understanding and acceptance.     Dental advisory given  Plan Discussed with: CRNA and Anesthesiologist  Anesthesia Plan Comments: (68 year old male 4 days S/P Lumbar foraminotomy now with epidural hematoma and LE weakness Type 2 DM glucose- 93 Htn  Renal insufficiency Cr. 1.72 baseline 1.57  Plan GA with RSI  Roberts Gaudy )       Anesthesia Quick Evaluation

## 2019-09-20 NOTE — Anesthesia Postprocedure Evaluation (Signed)
Anesthesia Post Note  Patient: ARION SHANKLES  Procedure(s) Performed: EVACUATION LUMBAR EPIDURAL HEMATOMA (N/A Back)     Patient location during evaluation: PACU Anesthesia Type: General Level of consciousness: awake Pain management: pain level controlled Vital Signs Assessment: post-procedure vital signs reviewed and stable Respiratory status: spontaneous breathing, nonlabored ventilation, respiratory function stable and patient connected to nasal cannula oxygen Cardiovascular status: blood pressure returned to baseline and stable Postop Assessment: no apparent nausea or vomiting Anesthetic complications: no    Last Vitals:  Vitals:   09/20/19 1415 09/20/19 1530  BP: (!) 146/69 124/84  Pulse: (!) 109 (!) 104  Resp: 18 20  Temp: 37.2 C 36.8 C  SpO2: 94% 97%    Last Pain:  Vitals:   09/20/19 1530  TempSrc: Oral  PainSc:                  Cruze Zingaro P Naseem Varden

## 2019-09-20 NOTE — Evaluation (Signed)
Physical Therapy Evaluation Patient Details Name: Jerry York MRN: [id number] DOB: [birth year] Today's Date: 09/20/2019   History of Present Illness  Pt is a 68 y/o male who is s/p L2-S1 laminectomy/decompression on 09/16/2019. Developed increased pain and LE weakness post surgery and found to have epidural hematoma.  Now s/p evacuation of hematoma on 09/20/19.  PMH significant for DMII, RA, renal insufficiency, HTN, heart murmur, gout, rotator cuff repair (R).  Clinical Impression  Patient presents with decreased strength, decreased balance, decreased safety awareness and limited activity tolerance with pain with mobility all limiting independence and safety.  Currently min to mod A for bed mobility and steps to chair with RW.  Feel he will benefit from skilled PT in the acute setting to maximize mobility for return home with wife able to provide supervision.      Follow Up Recommendations Home health PT;Supervision/Assistance - 24 hour    Equipment Recommendations  3in1 (PT)    Recommendations for Other Services       Precautions / Restrictions Precautions Precautions: Fall Precaution Booklet Issued: No Precaution Comments: verbally reviewed Required Braces or Orthoses: (no brace needed) Restrictions Weight Bearing Restrictions: No      Mobility  Bed Mobility Overal bed mobility: Needs Assistance Bed Mobility: Rolling;Sidelying to Sit Rolling: Min assist Sidelying to sit: Min assist;Mod assist       General bed mobility comments: some lifting help for the trunk  Transfers Overall transfer level: Needs assistance Equipment used: Rolling walker (2 wheeled) Transfers: Sit to/from [facility] Sit to Stand: Min assist Stand pivot transfers: Min assist       General transfer comment: for balance, to stabilize walker; initially not sure he could take steps, then able to step about 2' over to chair with min A with RW  Ambulation/Gait              General Gait Details: NT due to pain and LE weakness  Stairs            Wheelchair Mobility    Modified Rankin (Stroke Patients Only)       Balance Overall balance assessment: Needs assistance Sitting-balance support: Feet supported Sitting balance-Leahy Scale: Fair     Standing balance support: Bilateral upper extremity supported Standing balance-Leahy Scale: Poor Standing balance comment: reliant on UE support on walker                             Pertinent Vitals/Pain Pain Assessment: 0-10 Pain Score: 7  Pain Location: back Pain Descriptors / Indicators: Grimacing;Sharp Pain Intervention(s): Monitored during session;Repositioned    Home Living Family/patient expects to be discharged to:: Private residence Living Arrangements: Spouse/significant other Available Help at Discharge: Family Type of Home: House Home Access: Ramped entrance   Entrance Stairs-Number of Steps: 3 Home Layout: One level Home Equipment: Environmental consultant - 2 wheels;Shower seat - built in;Grab bars - tub/shower      Prior Function Level of Independence: Needs assistance   Gait / Transfers Assistance Needed: unable to ambulate since discharge     Comments: independent prior to surgery on 9/21     Hand Dominance        Extremity/Trunk Assessment        Lower Extremity Assessment Lower Extremity Assessment: LLE deficits/detail;RLE deficits/detail RLE Deficits / Details: ankle AROM WFL, strength hip flexion 3-/5, knee extension 4-/5 LLE Deficits / Details: ankle AROM WFL, strength hip flexion 3-/5, knee extension 4-/5  Communication   Communication: No difficulties  Cognition Arousal/Alertness: Awake/alert Behavior During Therapy: WFL for tasks assessed/performed Overall Cognitive Status: Within Functional Limits for tasks assessed                                        General Comments      Exercises     Assessment/Plan    PT Assessment  Patient needs continued PT services  PT Problem List Decreased strength;Decreased activity tolerance;Decreased mobility;Decreased safety awareness;Pain;Decreased knowledge of precautions;Decreased balance       PT Treatment Interventions DME instruction;Gait training;Functional mobility training;Patient/family education;Balance training;Therapeutic activities    PT Goals (Current goals can be found in the Care Plan section)  Acute Rehab PT Goals Patient Stated Goal: to help pain and be able to walk PT Goal Formulation: With patient Time For Goal Achievement: 09/27/19 Potential to Achieve Goals: Good    Frequency Min 5X/week   Barriers to discharge        Co-evaluation               AM-PAC PT "6 Clicks" Mobility  Outcome Measure Help needed turning from your back to your side while in a flat bed without using bedrails?: A Little Help needed moving from lying on your back to sitting on the side of a flat bed without using bedrails?: A Lot Help needed moving to and from a bed to a chair (including a wheelchair)?: A Little Help needed standing up from a chair using your arms (e.g., wheelchair or bedside chair)?: A Little Help needed to walk in hospital room?: A Lot Help needed climbing 3-5 steps with a railing? : Total 6 Click Score: 14    End of Session Equipment Utilized During Treatment: Gait belt Activity Tolerance: Patient limited by pain Patient left: in chair;with call bell/phone within reach;with chair alarm set Nurse Communication: Mobility status PT Visit Diagnosis: Muscle weakness (generalized) (M62.81);Difficulty in walking, not elsewhere classified (R26.2);Pain Pain - part of body: (back)    Time: 1715-1740 PT Time Calculation (min) (ACUTE ONLY): 25 min   Charges:   PT Evaluation $PT Eval Moderate Complexity: 1 Mod PT Treatments $Therapeutic Activity: 8-22 mins        Jerry York, [facility] [phone] 09/20/2019   Jerry York 09/20/2019, 5:52 PM

## 2019-09-20 NOTE — Op Note (Signed)
09/20/2019  7:36 AM  PATIENT:  Jerry York  68 y.o. male  PRE-OPERATIVE DIAGNOSIS: Postoperative lumbar epidural hematoma  POST-OPERATIVE DIAGNOSIS:  same  PROCEDURE: Lumbar reexploration with removal of epidural hematoma L2-S1  SURGEON:  Sherley Bounds, MD  ASSISTANTS: Earle Gell, MD  ANESTHESIA:   General  EBL: 50 ml  Total I/O In: -  Out: 65 [Blood:50]  BLOOD ADMINISTERED: none  DRAINS: 2 medium Hemovac drains  SPECIMEN:  none  INDICATION FOR PROCEDURE: This patient presented with back pain and leg pain and difficulty walking with some urinary retention 4 days after 4 level laminectomy for stenosis. Imaging showed epidural hematoma. Recommended lumbar reexploration with evacuation of postoperative epidural hematoma. Patient understood the risks, benefits, and alternatives and potential outcomes and wished to proceed.  PROCEDURE DETAILS: The patient was taken to the operating room and after induction of adequate generalized endotracheal anesthesia, the patient was rolled into the prone position on the Wilson frame and all pressure points were padded. The lumbar region was cleaned and then prepped with DuraPrep and draped in the usual sterile fashion. 5 cc of local anesthesia was injected and then a dorsal midline incision was made and carried down to the lumbo sacral fascia. The fascia was opened and the paraspinous musculature was taken down in a subperiosteal fashion to expose previous laminectomy from L2-S1.  There was some epidural hematoma and some Gelfoam that was removed.  It was not overly impressive.  The dura looked full and capacious throughout.  There was some mild ooze from the muscular tissues.  I spent considerable time drying the muscular tissues and surgical bed with unipolar cautery.  I irrigated with saline solution containing bacitracin. Achieved hemostasis with bipolar cautery as best I could, lined the dura with Gelfoam, laced 2 medium Hemovac drains through  separate stab incisions and then closed the muscle and fascia with 0 Vicryl. I closed the subcutaneous tissues with 2-0 Vicryl and the subcuticular tissues with 3-0 Vicryl. The skin was then closed with benzoin and Steri-Strips. The drapes were removed, a sterile dressing was applied. The patient was awakened from general anesthesia and transferred to the recovery room in stable condition. At the end of the procedure all sponge, needle and instrument counts were correct.    PLAN OF CARE: Admit to inpatient   PATIENT DISPOSITION:  PACU - hemodynamically stable.   Delay start of Pharmacological VTE agent (>24hrs) due to surgical blood loss or risk of bleeding:  yes

## 2019-09-20 NOTE — ED Notes (Signed)
Patient transported to MRI 

## 2019-09-20 NOTE — ED Notes (Signed)
Bladder Scan = 878 cc.

## 2019-09-20 NOTE — H&P (Signed)
Subjective: The patient is a 68 year old white male patient of Dr. Sherley Bounds.  Dr. Ronnald Ramp performed a lumbar laminectomy on him on 09/16/2019.  The patient's postoperative course was unremarkable.  He was discharged to home on 09/17/2019.  The patient says he has not been able to stand since discharge.  He developed urinary retention last evening.  He came to the [facility], ER and was evaluated by Dr. Roxanne Mins to include a lumbar MRI.  This demonstrated an epidural hematoma with significant neural compression.  Presently the patient is alert and pleasant.  He is not complaining of much pain or numbness.  He feels his legs are too weak to stand.  He denies perineal numbness.  He has a Foley catheter in place.  He is not on any anticoagulants.  Past Medical History:  Diagnosis Date  . Anxiety   . Bilateral renal cysts   . Gout    02-22-2017 acute right foot gout---  per pt resolved  . Gout   . Gross hematuria   . Heart murmur   . History of BPH 08/14/2017  . Hydronephrosis, left   . Hyperlipidemia   . Hypertension   . Renal insufficiency   . Rheumatoid arthritis ([place])   . Swelling   . Type 2 diabetes mellitus ([place])   . Urinary retention   . Wears glasses     Past Surgical History:  Procedure Laterality Date  . APPENDECTOMY  age 22  . CYSTOSCOPY WITH URETEROSCOPY AND STENT PLACEMENT Left 05/08/2017   Procedure: URETEROSCOPY AND STENT PLACEMENT;  Surgeon: Kathie Rhodes, MD;  Location: [facility];  Service: Urology;  Laterality: Left;  . CYSTOSCOPY/RETROGRADE/URETEROSCOPY Bilateral 05/08/2017   Procedure: CYSTOSCOPY/ BILATERAL RETROGRADE;  Surgeon: Kathie Rhodes, MD;  Location: [facility];  Service: Urology;  Laterality: Bilateral;  . INGUINAL HERNIA REPAIR Right 10/10/2000  . KNEE ARTHROSCOPY Right 1980's  . LUMBAR LAMINECTOMY/DECOMPRESSION MICRODISCECTOMY N/A 09/16/2019   Procedure: Laminectomy and Foraminotomy - Lumbar Two-Lumbar Three - Lumbar Three-Lumbar  Four - Lumbar Four-Lumbar Five - Lumbar Five-Sacral One;  Surgeon: Eustace Moore, MD;  Location: [facility] OR;  Service: Neurosurgery;  Laterality: N/A;  Laminectomy and Foraminotomy - Lumbar Two-Lumbar Three - Lumbar Three-Lumbar Four - Lumbar Four-Lumbar Five - Lumbar Five-Sacral One  . NASAL FRACTURE SURGERY     in highschool   . SHOULDER ARTHROSCOPY WITH OPEN ROTATOR CUFF REPAIR Right 1990's  . TRANSURETHRAL RESECTION OF PROSTATE      Allergies  Allergen Reactions  . Sulfa Antibiotics Other (See Comments)    "make my feet burn"  . Azithromycin Nausea And Vomiting  . Metformin And Related Diarrhea    Social History   Tobacco Use  . Smoking status: Never Smoker  . Smokeless tobacco: Never Used  Substance Use Topics  . Alcohol use: No    Family History  Problem Relation Age of Onset  . Congestive Heart Failure Mother        Related to valve disease.  Opted to treat medically  . Hypertension Mother   . Hyperlipidemia Mother   . Diabetes Mother   . Hypertension Father   . Hyperlipidemia Father   . Coronary artery disease Father 39       History of CABG  . Stroke Father   . Hypertension Brother   . Diabetes Brother   . Hypertension Daughter   . Hypertension Son   . Hypertension Brother   . Hypertension Brother    Prior to Admission medications  Medication Sig Start Date End Date Taking? Authorizing Provider  allopurinol (ZYLOPRIM) 100 MG tablet Take 3 tablets (300 mg total) by mouth daily. Patient taking differently: Take 100 mg by mouth 3 (three) times daily.  08/06/19   Ofilia Neas, PA-C  amLODipine (NORVASC) 5 MG tablet Take 1 tablet (5 mg total) by mouth daily. 06/03/19   [name], MD  atorvastatin (LIPITOR) 40 MG tablet Take 1 tablet (40 mg total) by mouth daily. Patient taking differently: Take 40 mg by mouth at bedtime.  09/06/19   [name], MD  [name] Take 500 mg by mouth 2 (two) times daily.     [provider]  clonazePAM (KLONOPIN) 0.5 MG  tablet Take 1 tablet (0.5 mg total) by mouth 2 (two) times daily. 06/03/19   [name], MD  colchicine 0.6 MG tablet TAKE 1 TABLET BY MOUTH DAILY. TAKE 2 TIMES A DAY IF EXPERIENCING A FLARE Patient taking differently: Take 0.6 mg by mouth See admin instructions. Take 0.6 mg daily, may take twice daily if experiencing a gout flare 08/06/19   Ofilia Neas, PA-C  Continuous Blood Gluc Sensor ([name] 14 DAY SENSOR) MISC Apply 1 kit topically every 14 (fourteen) days. 04/08/19   [name], MD  cyclobenzaprine (FLEXERIL) 5 MG tablet Take 1 tablet (5 mg total) by mouth 3 (three) times daily as needed for muscle spasms. 09/18/19   Meyran, Ocie Cornfield, NP  furosemide (LASIX) 80 MG tablet Take 1 tablet (80 mg total) by mouth 2 (two) times daily. 03/06/19   [name], MD  glucose blood ([name] TEST STRIPS) test strip Check blood sugars 2-3 times per day. DX: E11.9 09/01/17   [name], MD  glucose monitoring kit ([name]) monitoring kit 1 each by Does not apply route as needed for other. DX Code: E11.9 06/11/18   [name], MD  HUMULIN R U-500 KWIKPEN 500 UNIT/ML kwikpen Inject 60-80 Units as directed See admin instructions. INJECT 80 UNITS BEFORE BREAKFAST 60 UNITS BEFORE LUNCH AND 60 UNITS BEFORE EVENING MEAL. [place] 08/12/19   [provider]  insulin lispro (HUMALOG) 100 UNIT/ML injection Inject 30-80 Units into the skin 3 (three) times daily before meals.    [provider]  methocarbamol (ROBAXIN) 500 MG tablet Take 1 tablet (500 mg total) by mouth every 6 (six) hours as needed for muscle spasms. 09/17/19   Eustace Moore, MD  metoprolol tartrate (LOPRESSOR) 100 MG tablet Take 1 tablet (100 mg total) by mouth 2 (two) times daily. 03/06/19   [name], MD  oxyCODONE-acetaminophen (PERCOCET) 10-325 MG tablet Take 1 tablet by mouth every 4 (four) hours as needed for pain. 09/17/19 09/16/20  Eustace Moore, MD  oxyCODONE-acetaminophen  (PERCOCET) 10-325 MG tablet Take 1 tablet by mouth every 4 (four) hours as needed for pain. 09/18/19 09/17/20  Meyran, Ocie Cornfield, NP  traZODone (DESYREL) 100 MG tablet Take 2 tablets (200 mg total) by mouth at bedtime as needed for sleep. Patient taking differently: Take 200 mg by mouth at bedtime.  09/06/19   [name], MD     Review of Systems  Positive ROS: As above  All other systems have been reviewed and were otherwise negative with the exception of those mentioned in the HPI and as above.  Objective: Vital signs in last 24 hours: Temp:  [98.8 F (37.1 C)] 98.8 F (37.1 C) (09/24 2342) Pulse Rate:  [102-112]  103 (09/25 0515) Resp:  [15-27] 16 (09/25 0515) BP: (108-139)/(64-84) 117/75 (09/25 0515) SpO2:  [90 %-97 %] 92 % (09/25 0515) Estimated body mass index is 31.77 kg/m as calculated from the following:   Height as of 09/16/19: 5' 5"  (1.651 m).   Weight as of 09/16/19: 86.6 kg.   Physical exam:  General: An alert and pleasant 68 year old white male in no apparent distress  HEENT: Normocephalic, atraumatic, extraocular muscles intact  Neck: Limited range of motion otherwise unremarkable  Thorax: Symmetric  Abdomen: Obese and soft  Extremities: Unremarkable  Neurologic exam: The patient is alert and oriented x3.  Cranial nerve exam is grossly normal.  The patient has normal sensation to light touch in his bilateral lower extremities.  His strength is approximately 4+/5 in his bilateral gastrocnemius and dorsiflexors.  He has weakness in his right quadricep at 4-/5, 4+/5 on the left.  He has slight bilateral psoas weakness.  I have reviewed the patient's lumbar MRI performed at [facility] this morning.  It demonstrates what appears to be a large epidural hematoma with significant thecal sac/neural compression.   Data Review Lab Results  Component Value Date   WBC 6.4 09/20/2019   HGB 13.1 09/20/2019   HCT 39.4 09/20/2019   MCV 90.2 09/20/2019    PLT 125 (L) 09/20/2019   Lab Results  Component Value Date   NA 141 09/20/2019   K 3.3 (L) 09/20/2019   CL 100 09/20/2019   CO2 27 09/20/2019   BUN 48 (H) 09/20/2019   CREATININE 1.72 (H) 09/20/2019   GLUCOSE 72 09/20/2019   Lab Results  Component Value Date   INR 1.0 09/12/2019    Assessment/Plan: Lumbar epidural hematoma, cauda equina syndrome: I have discussed the situation with the patient.  I recommended an exploration of his lumbar wound with evacuation of presumed epidural hematoma.  I have described that surgery to him.  We have discussed the risks of surgery including the risk of anesthesia, hemorrhage, infection, spinal fluid leak, nerve injury, etc.  We have discussed the risks, benefits, alternatives, expected postoperative course, and likelihood of achieving our goals with surgery.  I have answered all his questions.  He has decided to proceed with surgery.  His COVID came back negative.  We are awaiting the OR.   Ophelia Charter 09/20/2019 5:57 AM

## 2019-09-20 NOTE — ED Notes (Signed)
Patient currently at MRI

## 2019-09-21 LAB — GLUCOSE, CAPILLARY
Glucose-Capillary: 178 mg/dL — ABNORMAL HIGH (ref 70–99)
Glucose-Capillary: 168 mg/dL — ABNORMAL HIGH (ref 70–99)
Glucose-Capillary: 216 mg/dL — ABNORMAL HIGH (ref 70–99)
Glucose-Capillary: 320 mg/dL — ABNORMAL HIGH (ref 70–99)

## 2019-09-21 MED ORDER — DEXAMETHASONE SODIUM PHOSPHATE 4 MG/ML IJ SOLN
4.0000 mg | Freq: Four times a day (QID) | INTRAMUSCULAR | Status: AC
  Administered 2019-09-21 – 2019-09-22 (×4): 4 mg via INTRAVENOUS
  Filled 2019-09-21 (×4): qty 1

## 2019-09-21 MED ORDER — MELOXICAM 7.5 MG PO TABS
7.5000 mg | ORAL_TABLET | Freq: Two times a day (BID) | ORAL | Status: DC
  Administered 2019-09-21 – 2019-09-24 (×7): 7.5 mg via ORAL
  Filled 2019-09-21 (×8): qty 1

## 2019-09-21 MED ORDER — HYDROMORPHONE HCL 1 MG/ML IJ SOLN
0.5000 mg | INTRAMUSCULAR | Status: DC | PRN
  Administered 2019-09-21 – 2019-09-22 (×5): 0.5 mg via INTRAVENOUS
  Filled 2019-09-21 (×5): qty 0.5

## 2019-09-21 MED ORDER — OXYCODONE-ACETAMINOPHEN 5-325 MG PO TABS
1.0000 | ORAL_TABLET | ORAL | Status: DC | PRN
  Administered 2019-09-21 – 2019-09-22 (×2): 2 via ORAL
  Filled 2019-09-21 (×2): qty 2

## 2019-09-21 MED ORDER — OXYCODONE HCL 5 MG PO TABS
5.0000 mg | ORAL_TABLET | ORAL | Status: DC | PRN
  Administered 2019-09-21 – 2019-09-23 (×2): 5 mg via ORAL
  Filled 2019-09-21 (×2): qty 1

## 2019-09-21 NOTE — Progress Notes (Signed)
Occupational Therapy Evaluation Patient Details Name: Jerry York MRN: [id number] DOB: 12-29-1950 Today's Date: 09/21/2019    History of Present Illness Pt is a 68 y/o male who is s/p L2-S1 laminectomy/decompression on 09/16/2019. Developed increased pain and LE weakness post surgery and found to have epidural hematoma.  Now s/p evacuation of hematoma on 09/20/19.  PMH significant for DMII, RA, renal insufficiency, HTN, heart murmur, gout, rotator cuff repair (R).   Clinical Impression   Prior to first back surgery on 09/16/2019, pt lived with his wife in one story home and was independent for ADLs and IADLs. Pt currently presents with decreased activity tolerance, strength, balance, knowledge of precautions, and increased pain. Provided education for precautions and compensatory strategies for LB ADLs. Pt performed simulated toilet transfer to the recliner and partially donned LB clothing with AE with min A-mod A for safety and balance. Pt would benefit from continued OT services to address deficits with balance, strength, activity tolerance, knowledge of precautions, and pain. Recommend dc home with HHOT to increase safety with performance of ADLs once medically stable per MD.     Follow Up Recommendations  Home health OT;Supervision/Assistance - 24 hour    Equipment Recommendations  3 in 1 bedside commode    Recommendations for Other Services PT consult     Precautions / Restrictions Precautions Precautions: Fall;Back Precaution Booklet Issued: No Precaution Comments: Unable to recall precautions. Reviewed 3/3 precautions Required Braces or Orthoses: (no brace needed) Restrictions Weight Bearing Restrictions: No      Mobility Bed Mobility           General bed mobility comments: Dovetail with PT. Pt sitting in recliner upon arrival  Transfers Overall transfer level: Needs assistance Equipment used: Rolling walker (2 wheeled) Transfers: Sit to/from Stand Sit to Stand:  Min assist         General transfer comment: required min A for power up and VCs for hand placement    Balance Overall balance assessment: Needs assistance Sitting-balance support: Feet supported Sitting balance-Leahy Scale: Fair     Standing balance support: Bilateral upper extremity supported;During functional activity Standing balance-Leahy Scale: Poor Standing balance comment: requires BUE on RW                           ADL either performed or assessed with clinical judgement   ADL Overall ADL's : Needs assistance/impaired Eating/Feeding: Independent   Grooming: Set up;Sitting   Upper Body Bathing: Supervision/ safety;Sitting   Lower Body Bathing: Moderate assistance   Upper Body Dressing : Supervision/safety;Sitting   Lower Body Dressing: Sit to/from stand;With adaptive equipment;Moderate assistance Lower Body Dressing Details (indicate cue type and reason): Provided education on use of AE for LB ADLs. Pt demonstrated understanding and partially donned pants with supervision, require mod A to stand and adjust LB clothing Toilet Transfer: Minimal assistance;Ambulation(simulated to recliner) Toilet Transfer Details (indicate cue type and reason): pt required min A for safety and balance Toileting- Clothing Manipulation and Hygiene: Minimal assistance;Sit to/from stand       Functional mobility during ADLs: Min guard;Rolling walker General ADL Comments: Pt requires supervision- min guard A for safety for ADLs, mod A for LB ADLs     Vision Baseline Vision/History: Wears glasses Wears Glasses: At all times Patient Visual Report: No change from baseline       Perception     Praxis      Pertinent Vitals/Pain Pain Assessment: Faces Faces Pain Scale: Hurts  even more Pain Location: back Pain Descriptors / Indicators: Aching;Discomfort;Grimacing;Guarding;Operative site guarding;Sore Pain Intervention(s): Monitored during session;Limited activity within  patient's tolerance;Repositioned     Hand Dominance Right   Extremity/Trunk Assessment Upper Extremity Assessment Upper Extremity Assessment: Overall WFL for tasks assessed   Lower Extremity Assessment Lower Extremity Assessment: Defer to PT evaluation   Cervical / Trunk Assessment Cervical / Trunk Assessment: Other exceptions Cervical / Trunk Exceptions: s/p surgery   Communication Communication Communication: No difficulties   Cognition Arousal/Alertness: Awake/alert Behavior During Therapy: WFL for tasks assessed/performed Overall Cognitive Status: Within Functional Limits for tasks assessed                                 General Comments: Benefits from encouragement   General Comments       Exercises     Shoulder Instructions      Home Living Family/patient expects to be discharged to:: Private residence Living Arrangements: Spouse/significant other Available Help at Discharge: Family Type of Home: House Home Access: Ramped entrance     Home Layout: One level     Bathroom Shower/Tub: Walk-in shower;Door   [employer] Toilet: Standard     Home Equipment: Hand held shower head;Grab bars - tub/shower;Shower seat - built in   Additional Comments: Pt reports that wife can help him with shoes if needed, but she does not like to be bothered      Prior Functioning/Environment Level of Independence: Independent        Comments: independent prior to surgery on 9/21        OT Problem List: Decreased strength;Decreased range of motion;Decreased activity tolerance;Impaired balance (sitting and/or standing);Decreased safety awareness;Decreased knowledge of use of DME or AE;Decreased knowledge of precautions;Pain      OT Treatment/Interventions: Self-care/ADL training;DME and/or AE instruction;Patient/family education;Balance training    OT Goals(Current goals can be found in the care plan section) Acute Rehab OT Goals Patient Stated Goal:  Decrease pain OT Goal Formulation: With patient Time For Goal Achievement: 10/05/19 Potential to Achieve Goals: Good  OT Frequency: Min 3X/week   Barriers to D/C:            Co-evaluation              AM-PAC OT "6 Clicks" Daily Activity     Outcome Measure Help from another person eating meals?: None Help from another person taking care of personal grooming?: A Little Help from another person toileting, which includes using toliet, bedpan, or urinal?: A Little Help from another person bathing (including washing, rinsing, drying)?: A Little Help from another person to put on and taking off regular upper body clothing?: None Help from another person to put on and taking off regular lower body clothing?: A Little 6 Click Score: 20   End of Session Equipment Utilized During Treatment: Rolling walker Nurse Communication: Mobility status  Activity Tolerance: Patient limited by pain Patient left: in chair;with chair alarm set;with call bell/phone within reach  OT Visit Diagnosis: Unsteadiness on feet (R26.81);Other abnormalities of gait and mobility (R26.89);Muscle weakness (generalized) (M62.81);History of falling (Z91.81);Pain Pain - part of body: (back)                Time: 1429-1450 OT Time Calculation (min): 21 min Charges:  OT General Charges $OT Visit: 1 Visit OT Evaluation $OT Eval Moderate Complexity: 1 Mod  Sandrea Hammond, OT Student  Sandrea Hammond 09/21/2019, 3:49 PM

## 2019-09-21 NOTE — Progress Notes (Signed)
Patient ID: Jerry York, male   DOB: [birth year], 68 y.o.   MRN: [id number] Unfortunately he still has a lot of back pain and aching in his legs and tingling in his legs.  Difficult to know how much the drain is put out as I think his urine output was charted in his drain output section.  It looks like he has had probably 40 cc out of the drain.  There is a small amount of bloody drainage in the drain now.  He has 4 out of 5 strength in all muscle groups of the lower extremities.  He looks to be in discomfort.  We are going to add Decadron despite his diabetes and try to manage his glucose levels with insulin sliding scale  I am going to change his pain medications around somewhat and add an NSAID.  Continue to try to mobilize  Do not believe that a repeat MRI at this time would be helpful, we will continue to monitor closely

## 2019-09-21 NOTE — Progress Notes (Signed)
Physical Therapy Treatment Patient Details Name: FREDI GEILER MRN: [id number] DOB: 08-12-1951 Today's Date: 09/21/2019    History of Present Illness Pt is a 68 y/o male who is s/p L2-S1 laminectomy/decompression on 09/16/2019. Developed increased pain and LE weakness post surgery and found to have epidural hematoma.  Now s/p evacuation of hematoma on 09/20/19.  PMH significant for DMII, RA, renal insufficiency, HTN, heart murmur, gout, rotator cuff repair (R).    PT Comments    Pt progressing well with post-op mobility. He was able to demonstrate transfers and ambulation with up to min/mod assist and RW for support. Pt was educated on precautions, brace application/wearing schedule, and appropriate activity progression. Overall pt is quick to report he cannot do a mobility task that therapist is suggesting and demonstrates decreased tolerance for functional activity. Encouraged RW use instead of Stedy with nursing staff. Will continue to follow.     Follow Up Recommendations  Home health PT;Supervision/Assistance - 24 hour     Equipment Recommendations  3in1 (PT)    Recommendations for Other Services       Precautions / Restrictions Precautions Precautions: Fall Precaution Booklet Issued: No Precaution Comments: verbally reviewed during functional activity. Has a precaution handout from original admission earlier this week.  Required Braces or Orthoses: (no brace needed) Restrictions Weight Bearing Restrictions: No    Mobility  Bed Mobility Overal bed mobility: Needs Assistance Bed Mobility: Rolling;Sidelying to Sit Rolling: Min assist Sidelying to sit: Min assist;Mod assist       General bed mobility comments: VC's for sequencing and use of rails. Pt required assist for full roll and for trunk elevation to full sitting position.   Transfers Overall transfer level: Needs assistance Equipment used: Rolling walker (2 wheeled) Transfers: Sit to/from Stand Sit to Stand:  Min assist         General transfer comment: VC's for hand placement on seated surface for safety. Pt required assist for balance support during power-up to full stand when bringing hand from bed/chair to walker.   Ambulation/Gait Ambulation/Gait assistance: Min guard;+2 safety/equipment(chair follow) Gait Distance (Feet): 100 Feet(50', seated rest, 50') Assistive device: Rolling walker (2 wheeled) Gait Pattern/deviations: Step-through pattern;Decreased stride length;Trunk flexed;Narrow base of support Gait velocity: Decreased Gait velocity interpretation: <1.8 ft/sec, indicate of risk for recurrent falls General Gait Details: VC's for improved posture, forward gaze, and closer walker proximity. Pt reporting his legs were "giving out on him" however did not note any overt buckling or LOB throughout gait training. 1 seated rest break due to fatigue and pain.    Stairs             Wheelchair Mobility    Modified Rankin (Stroke Patients Only)       Balance Overall balance assessment: Needs assistance Sitting-balance support: Feet supported Sitting balance-Leahy Scale: Fair     Standing balance support: Bilateral upper extremity supported Standing balance-Leahy Scale: Poor Standing balance comment: reliant on UE support on walker                            Cognition Arousal/Alertness: Awake/alert Behavior During Therapy: WFL for tasks assessed/performed Overall Cognitive Status: Within Functional Limits for tasks assessed                                        Exercises      General  Comments        Pertinent Vitals/Pain Pain Assessment: Faces Faces Pain Scale: Hurts even more Pain Location: back Pain Descriptors / Indicators: Grimacing;Operative site guarding Pain Intervention(s): Monitored during session;Limited activity within patient's tolerance;Repositioned    Home Living                      Prior Function             PT Goals (current goals can now be found in the care plan section) Acute Rehab PT Goals Patient Stated Goal: Decrease pain PT Goal Formulation: With patient Time For Goal Achievement: 09/27/19 Potential to Achieve Goals: Good Progress towards PT goals: Progressing toward goals    Frequency    Min 5X/week      PT Plan Current plan remains appropriate    Co-evaluation              AM-PAC PT "6 Clicks" Mobility   Outcome Measure  Help needed turning from your back to your side while in a flat bed without using bedrails?: A Little Help needed moving from lying on your back to sitting on the side of a flat bed without using bedrails?: A Lot Help needed moving to and from a bed to a chair (including a wheelchair)?: A Little Help needed standing up from a chair using your arms (e.g., wheelchair or bedside chair)?: A Little Help needed to walk in hospital room?: A Lot Help needed climbing 3-5 steps with a railing? : Total 6 Click Score: 14    End of Session Equipment Utilized During Treatment: Gait belt Activity Tolerance: Patient limited by pain Patient left: in chair;with call bell/phone within reach;with chair alarm set Nurse Communication: Mobility status PT Visit Diagnosis: Muscle weakness (generalized) (M62.81);Difficulty in walking, not elsewhere classified (R26.2);Pain Pain - part of body: (back)     Time: 1410-1440 PT Time Calculation (min) (ACUTE ONLY): 30 min  Charges:  $Gait Training: 23-37 mins                     Conni Slipper, PT, DPT Acute Rehabilitation Services Pager: [phone] Office: [phone]    Marylynn Pearson 09/21/2019, 2:48 PM

## 2019-09-22 LAB — GLUCOSE, CAPILLARY
Glucose-Capillary: 314 mg/dL — ABNORMAL HIGH (ref 70–99)
Glucose-Capillary: 302 mg/dL — ABNORMAL HIGH (ref 70–99)
Glucose-Capillary: 340 mg/dL — ABNORMAL HIGH (ref 70–99)
Glucose-Capillary: 275 mg/dL — ABNORMAL HIGH (ref 70–99)

## 2019-09-22 MED ORDER — WHITE PETROLATUM EX OINT
TOPICAL_OINTMENT | CUTANEOUS | Status: AC
  Administered 2019-09-22: 0.2
  Filled 2019-09-22: qty 28.35

## 2019-09-22 MED ORDER — POLYETHYLENE GLYCOL 3350 17 G PO PACK
17.0000 g | PACK | Freq: Every day | ORAL | Status: DC
  Administered 2019-09-22: 18:00:00 17 g via ORAL
  Filled 2019-09-22 (×3): qty 1

## 2019-09-22 NOTE — Progress Notes (Signed)
Was stronger during the am assessment and was assisting and working with this nurse during the assessment.  During the reassessment, he was not doing as much and said he is just too tired and not strong at all.  He did say that he is tired of hearing that he is better when he is not.  This nurse reminded him that he walked in the hallway yesterday and he was up at the sink and did his own bath with min assist from myself.  He said that was because we insisted.  Michela Pitcher he is not better and does not want to be rushed out of the hospital because he needs to take his time and heal and get stronger in the hospital and then go home, and not to a nursing home.

## 2019-09-22 NOTE — Progress Notes (Signed)
Occupational Therapy Treatment Patient Details Name: Jerry York MRN: [id number] DOB: [birth year] Today's Date: 09/22/2019    History of present illness Pt is a 68 y/o male who is s/p L2-S1 laminectomy/decompression on 09/16/2019. Developed increased pain and LE weakness post surgery and found to have epidural hematoma.  Now s/p evacuation of hematoma on 09/20/19.  PMH significant for DMII, RA, renal insufficiency, HTN, heart murmur, gout, rotator cuff repair (R).   OT comments  Pt continues to make progress towards OT goals. Pt currently presenting with decreased strength, balance, activity tolerance, knowledge of precautions, and increased pain. Provided education regarding precautions and reducing pain by moving. Pt performed bed mobility, functional mobility, and oral care with set up- min A and VCs for safety and to maintain precautions. Continue to recommend dc with HHOT, however pending progress and inconsistencies among therapies, may require dc to SNF. Will continue to follow acutely as admitted.    Follow Up Recommendations  Home health OT;SNF(Progress inconsistencies among therapies, may need SNF)    Equipment Recommendations  3 in 1 bedside commode    Recommendations for Other Services PT consult    Precautions / Restrictions Precautions Precautions: Fall;Back Precaution Booklet Issued: No Precaution Comments: Recalled 1/3 precautions. Reviewed 3/3 precautions Required Braces or Orthoses: (no brace needed) Restrictions Weight Bearing Restrictions: No       Mobility Bed Mobility Overal bed mobility: Needs Assistance Bed Mobility: Rolling;Sidelying to Sit;Sit to Sidelying Rolling: Min assist;Min guard Sidelying to sit: Min assist     Sit to sidelying: Min assist General bed mobility comments: Pt required increased time and min A to push up to sitting and to bring feet to EOB. required min A for raising legs onto bed, min guard A for rolling back into  bed  Transfers Overall transfer level: Needs assistance Equipment used: Rolling walker (2 wheeled) Transfers: Sit to/from Stand Sit to Stand: Min assist         General transfer comment: required min A and VCs for hand placement on walker and to raise trunk    Balance Overall balance assessment: Needs assistance Sitting-balance support: Feet supported Sitting balance-Leahy Scale: Fair     Standing balance support: Bilateral upper extremity supported;During functional activity Standing balance-Leahy Scale: Poor Standing balance comment: requires BUE on RW                           ADL either performed or assessed with clinical judgement   ADL Overall ADL's : Needs assistance/impaired     Grooming: Oral care;Set up;Sitting Grooming Details (indicate cue type and reason): Pt performed oral care sitting EOB with set up                 Toilet Transfer: Min guard;Minimal assistance;Ambulation;RW(simulated to recliner) [employer] forces technical [employer] Details (indicate cue type and reason): Pt required min A for power up to stand, min guard A for safety and balance         Functional mobility during ADLs: Min guard;Rolling walker General ADL Comments: Pt performed oral care and functional mobility with set up- min A for safety     Vision       Perception     Praxis      Cognition Arousal/Alertness: Awake/alert Behavior During Therapy: WFL for tasks assessed/performed Overall Cognitive Status: No family/caregiver present to determine baseline cognitive functioning  Exercises     Shoulder Instructions       General Comments pt benefitted from encouragement throughout session    Pertinent Vitals/ Pain       Pain Assessment: Faces Faces Pain Scale: Hurts little more Pain Location: back and legs Pain Descriptors / Indicators: Discomfort;Grimacing;Aching Pain Intervention(s): Monitored during  session;Patient requesting pain meds-RN notified;Limited activity within patient's tolerance  Home Living                                          Prior Functioning/Environment              Frequency  Min 3X/week        Progress Toward Goals  OT Goals(current goals can now be found in the care plan section)  Progress towards OT goals: Progressing toward goals  Acute Rehab OT Goals Patient Stated Goal: Decrease pain OT Goal Formulation: With patient Time For Goal Achievement: 10/05/19 Potential to Achieve Goals: Good ADL Goals Pt Will Perform Grooming: with modified independence;sitting Pt Will Perform Upper Body Dressing: with modified independence;sitting Pt Will Perform Lower Body Dressing: with supervision;with adaptive equipment;sit to/from stand Pt Will Transfer to Toilet: with supervision;ambulating;bedside commode Pt Will Perform Toileting - Clothing Manipulation and hygiene: with supervision;sit to/from stand Pt Will Perform Tub/Shower Transfer: Shower transfer;with supervision;shower seat;ambulating;rolling walker Additional ADL Goal #1: Pt will perform bed mobility with supervision in preparation for ADLs. Additional ADL Goal #2: Pt will verbalize 3/3 precautions with 1 verbal cue.  Plan Other (comment)(Pending progress, HHOT vs SNF)    Co-evaluation                 AM-PAC OT "6 Clicks" Daily Activity     Outcome Measure   Help from another person eating meals?: None Help from another person taking care of personal grooming?: A Little Help from another person toileting, which includes using toliet, bedpan, or urinal?: A Little Help from another person bathing (including washing, rinsing, drying)?: A Little Help from another person to put on and taking off regular upper body clothing?: None Help from another person to put on and taking off regular lower body clothing?: A Little 6 Click Score: 20    End of Session Equipment  Utilized During Treatment: Rolling walker;Gait belt  OT Visit Diagnosis: Unsteadiness on feet (R26.81);Other abnormalities of gait and mobility (R26.89);Muscle weakness (generalized) (M62.81);History of falling (Z91.81);Pain Pain - part of body: (Back Simultaneous filing. User may not have seen previous data.)   Activity Tolerance Patient limited by pain   Patient Left in bed;with call bell/phone within reach   Nurse Communication Mobility status;Patient requests pain meds        Time: 1416-1446 OT Time Calculation (min): 30 min  Charges: OT General Charges $OT Visit: 1 Visit OT Treatments $Self Care/Home Management : 8-22 mins $Therapeutic Activity: 8-22 mins  Sandrea Hammond, OT Student  Jerry York 09/22/2019, 4:30 PM

## 2019-09-22 NOTE — Progress Notes (Signed)
Patient ID: Jerry York, male   DOB: 1951-11-28, 68 y.o.   MRN: [id number] Patients appear better than yesterday morning. Therapies stated improvements in mobility yesterday but he does not seem to think so. Continue pain management today and therapy. Continue drains likely until tomorrow

## 2019-09-22 NOTE — Progress Notes (Signed)
Physical Therapy Treatment Patient Details Name: Jerry York MRN: [id number] DOB: 1951-03-05 Today's Date: 09/22/2019    History of Present Illness Pt is a 68 y/o male who is s/p L2-S1 laminectomy/decompression on 09/16/2019. Developed increased pain and LE weakness post surgery and found to have epidural hematoma.  Now s/p evacuation of hematoma on 09/20/19.  PMH significant for DMII, RA, renal insufficiency, HTN, heart murmur, gout, rotator cuff repair (R).    PT Comments    First attempt to see pt he reported he was in 10/10 pain and needed pain medicine. He was relaxed and supine with HOB elevated. Reviewed the meaning of 10/10 pain and he insisted he was at a level 10. Returned 45 minutes after patient had been given pain medication (and other meds) and pt insisting he had not taken ANY meds since I was last in his room. Ultimately, pt with decreased LE strength compared to evaluation on 9/25 (bil knee extension documented 4-/5; today 3/5). Yesterday he was able to walk with minguard assist 50 ft, sit and rest, and walk another 50 ft. Today he was only able to stand at EOB. Pt reporting increased bil LE weakness and numbness. Discussed with RN (familiar with this patient) and then contacted Dr. Yetta Barre re: patient's decline (including ?cognition).   Patient refusing SNF adamantly, yet agrees he is not safe to go home with his wife. This discussion irritates pt as he doesn't want to be "rushed out of here like last time."     Follow Up Recommendations  Supervision/Assistance - 24 hour;SNF(pt refusing SNF, however is safest option )     Equipment Recommendations  3in1 (PT)    Recommendations for Other Services       Precautions / Restrictions Precautions Precautions: Fall;Back Precaution Booklet Issued: No Precaution Comments: Unable to recall precautions. Reviewed 3/3 precautions Required Braces or Orthoses: (no brace needed) Restrictions Weight Bearing Restrictions: No     Mobility  Bed Mobility Overal bed mobility: Needs Assistance Bed Mobility: Rolling;Sidelying to Sit;Sit to Sidelying Rolling: Min guard Sidelying to sit: Min assist(with rail)     Sit to sidelying: Min assist General bed mobility comments: Pt required 3 attempts to push up from his side to sitting; incr time to scoot to EOB to reach feet to the floor; assist to raise legs onto bed; max cues throughout to maintain back precautions  Transfers Overall transfer level: Needs assistance Equipment used: Rolling walker (2 wheeled) Transfers: Sit to/from Stand Sit to Stand: Min assist;Mod assist;From elevated surface         General transfer comment: initial attempt did not achieve standing and returned to sit EOB; next 2 attempts successful, however remained forward flexed inisiting he could not stand all the way up. Knees  were straight,however would not push thru UEs to stand erect; stood for ~45 seconds   Ambulation/Gait             General Gait Details: pt not safe to ambulate with current incr weakness; RN and MD notified of this change   Stairs             Wheelchair Mobility    Modified Rankin (Stroke Patients Only)       Balance Overall balance assessment: Needs assistance Sitting-balance support: Feet supported Sitting balance-Leahy Scale: Fair     Standing balance support: Bilateral upper extremity supported;During functional activity Standing balance-Leahy Scale: Poor Standing balance comment: requires BUE on RW  Cognition Arousal/Alertness: Awake/alert Behavior During Therapy: WFL for tasks assessed/performed Overall Cognitive Status: No family/caregiver present to determine baseline cognitive functioning                                 General Comments: Did not recall RN giving him his meds at 9:16; RN reports he earlier did not recall getting insulin shot      Exercises      General  Comments General comments (skin integrity, edema, etc.): assessed knee extension strength with pt 3/5 (was 4-/5 on 9/25 PT evaluation); ?effort, however given max encouragement      Pertinent Vitals/Pain Pain Assessment: 0-10 Pain Score: 8  Pain Location: back and legs Pain Descriptors / Indicators: Numbness;Aching Pain Intervention(s): Monitored during session;Premedicated before session;Repositioned    Home Living                      Prior Function            PT Goals (current goals can now be found in the care plan section) Acute Rehab PT Goals Patient Stated Goal: Decrease pain Time For Goal Achievement: 09/27/19 Potential to Achieve Goals: Good Progress towards PT goals: Not progressing toward goals - comment    Frequency    Min 5X/week      PT Plan Current plan remains appropriate    Co-evaluation              AM-PAC PT "6 Clicks" Mobility   Outcome Measure  Help needed turning from your back to your side while in a flat bed without using bedrails?: A Little Help needed moving from lying on your back to sitting on the side of a flat bed without using bedrails?: A Lot Help needed moving to and from a bed to a chair (including a wheelchair)?: A Lot Help needed standing up from a chair using your arms (e.g., wheelchair or bedside chair)?: A Lot Help needed to walk in hospital room?: Total Help needed climbing 3-5 steps with a railing? : Total 6 Click Score: 11    End of Session Equipment Utilized During Treatment: Gait belt Activity Tolerance: Patient limited by fatigue;Treatment limited secondary to medical complications (Comment)(incr leg weakness, numbness) Patient left: in chair;with call bell/phone within reach;with chair alarm set Nurse Communication: Mobility status;Other (comment)(changes in strength and sensation; MD also notified) PT Visit Diagnosis: Muscle weakness (generalized) (M62.81);Difficulty in walking, not elsewhere classified  (R26.2);Pain Pain - part of body: (back)     Time: 8242-3536 PT Time Calculation (min) (ACUTE ONLY): 41 min  Charges:  $Neuromuscular Re-education: 23-37 mins $Self Care/Home Management: 8-22                       [name] Brunner, PT       [name] 09/22/2019, 1:02 PM

## 2019-09-22 NOTE — Progress Notes (Signed)
Rehab Admissions Coordinator Note:  Patient was screened by Cleatrice Burke for appropriateness for an Inpatient Acute Rehab Consult per PT change in recs due to decline in pt's functional mobility..  At this time, we are recommending Inpatient Rehab consult if pt would like to be considered for admit.   Cleatrice Burke 09/22/2019, 3:34 PM  I can be reached at [phone].

## 2019-09-23 ENCOUNTER — Encounter (HOSPITAL_COMMUNITY): Payer: Self-pay | Admitting: Neurosurgery

## 2019-09-23 LAB — GLUCOSE, CAPILLARY
Glucose-Capillary: 352 mg/dL — ABNORMAL HIGH (ref 70–99)
Glucose-Capillary: 330 mg/dL — ABNORMAL HIGH (ref 70–99)
Glucose-Capillary: 317 mg/dL — ABNORMAL HIGH (ref 70–99)
Glucose-Capillary: 374 mg/dL — ABNORMAL HIGH (ref 70–99)

## 2019-09-23 MED ORDER — MAGNESIUM CITRATE PO SOLN: 1.0000 | Freq: Once | ORAL | Status: DC

## 2019-09-23 MED ORDER — BISACODYL 10 MG RE SUPP: 10.0000 mg | Freq: Every day | RECTAL | Status: DC | PRN

## 2019-09-23 NOTE — Progress Notes (Signed)
Patient ID: Jerry York, male   DOB: 09/16/1951, 69 y.o.   MRN: [id number] Back sore like pressure, no leg pain today, seems to have good strength to in bed exam with at least 4/5 each muscle group, see how he progresses with PT/OT. CIR consult. Foley in place - will d'c today

## 2019-09-23 NOTE — Anesthesia Postprocedure Evaluation (Signed)
Anesthesia Post Note  Patient: Jerry York  Procedure(s) Performed: EVACUATION LUMBAR EPIDURAL HEMATOMA (N/A Back)     Patient location during evaluation: PACU Anesthesia Type: General Level of consciousness: awake and alert Pain management: pain level controlled Vital Signs Assessment: post-procedure vital signs reviewed and stable Respiratory status: spontaneous breathing, nonlabored ventilation, respiratory function stable and patient connected to nasal cannula oxygen Cardiovascular status: blood pressure returned to baseline and stable Postop Assessment: no apparent nausea or vomiting Anesthetic complications: no    Last Vitals:  Vitals:   09/23/19 0726 09/23/19 1126  BP: 120/61 126/65  Pulse: (!) 41 (!) 51  Resp: 20 20  Temp: 36.5 C 36.4 C  SpO2: 96% 96%    Last Pain:  Vitals:   09/23/19 1126  TempSrc: Oral  PainSc:                  Douglass Dunshee COKER

## 2019-09-23 NOTE — Progress Notes (Signed)
Inpatient Diabetes Program Recommendations  AACE/ADA: New Consensus Statement on Inpatient Glycemic Control (2015)  Target Ranges:  Prepandial:   less than 140 mg/dL      Peak postprandial:   less than 180 mg/dL (1-2 hours)      Critically ill patients:  140 - 180 mg/dL   Lab Results  Component Value Date   GLUCAP 330 (H) 09/23/2019   HGBA1C 8.1 08/09/2019    Review of Glycemic Control Results for Jerry York, Jerry York (MRN [id number]) as of 09/23/2019 12:38  Ref. Range 09/22/2019 12:11 09/22/2019 16:31 09/22/2019 21:26 09/23/2019 06:01 09/23/2019 11:23  Glucose-Capillary Latest Ref Range: 70 - 99 mg/dL 302 (H) 340 (H) 275 (H) 352 (H) 330 (H)   Diabetes history: DM2 Outpatient Diabetes medications: Humulin R U 500 80 units ac breakfast & 60 units ac supper Current orders for Inpatient glycemic control: Novolog moderate correction tid  Inpatient Diabetes Program Recommendations:   -Start U 500 insulin 40 units ac breakfast + 30 units ac supper (50% home dose) -Increase Novolog correction to resistant + hs 0-5 units  Thank you, Bethena Roys E. Brynden Thune, RN, MSN, CDE  Diabetes Coordinator Inpatient Glycemic Control Team Team Pager [phone] (8am-5pm) 09/23/2019 12:40 PM

## 2019-09-23 NOTE — Progress Notes (Signed)
OT Cancellation Note  Patient Details Name: Jerry York MRN: [id number] DOB: 13-Dec-1951   Cancelled Treatment:    Reason Eval/Treat Not Completed: Other (comment); pt eating lunch, request OT to check back at a later time. Will follow up for OT treatment as schedule allows.  Lou Cal, OT Supplemental Rehabilitation Services Pager [phone] Office [phone]   Raymondo Band 09/23/2019, 11:51 AM

## 2019-09-23 NOTE — Care Management Important Message (Signed)
Important Message  Patient Details  Name: Jerry York MRN: [id number] Date of Birth: 1951/03/25   Medicare Important Message Given:  Yes     Orbie Pyo 09/23/2019, 2:33 PM

## 2019-09-23 NOTE — Progress Notes (Signed)
Inpatient Rehabilitation Admissions Coordinator  Inpatient rehab consult received. I met with patient at bedside for rehab assessment. We discussed goals and expectations of an inpt rehab . Pt refuses SNF. I will begin insurance authorization with [employer] for a possible admit penidng insurance approval.  Danne Baxter, RN, MSN Rehab Admissions Coordinator [phone] 09/23/2019 1:07 PM

## 2019-09-23 NOTE — Progress Notes (Signed)
Physical Therapy Treatment Patient Details Name: Jerry York MRN: [id number] DOB: 1951/04/17 Today's Date: 09/23/2019    History of Present Illness Pt is a 68 y/o male who is s/p L2-S1 laminectomy/decompression on 09/16/2019. Developed increased pain and LE weakness post surgery and found to have epidural hematoma.  Now s/p evacuation of hematoma on 09/20/19.  PMH significant for DMII, RA, renal insufficiency, HTN, heart murmur, gout, rotator cuff repair (R).    PT Comments    Pt progressing slowly towards physical therapy goals. Tolerance for functional activity is decreased and pt requires seated rest breaks during gait training. Continue to feel pt will benefit most from post-acute rehab to maximize functional independence and decrease burden of care at home.   Follow Up Recommendations  Supervision/Assistance - 24 hour;SNF(pt refusing SNF, however is safest option )     Equipment Recommendations  3in1 (PT)    Recommendations for Other Services       Precautions / Restrictions Precautions Precautions: Fall;Back Precaution Booklet Issued: No Precaution Comments: Reviewed precautions during functional mobility Required Braces or Orthoses: (no brace needed) Restrictions Weight Bearing Restrictions: No    Mobility  Bed Mobility Overal bed mobility: Needs Assistance Bed Mobility: Rolling;Sidelying to Sit Rolling: Supervision Sidelying to sit: Min guard       General bed mobility comments: Increased time and use of rails required to transition to EOB.   Transfers Overall transfer level: Needs assistance Equipment used: Rolling walker (2 wheeled) Transfers: Sit to/from Stand Sit to Stand: Min guard         General transfer comment: Hands-on guarding required for power-up to full stand. VC's for hand placement on seated surface for safety, however pt reluctant to comply.   Ambulation/Gait Ambulation/Gait assistance: Min guard;+2 safety/equipment(chair follow) Gait  Distance (Feet): 100 Feet(25', seated rest, 75') Assistive device: Rolling walker (2 wheeled) Gait Pattern/deviations: Step-through pattern;Decreased stride length;Trunk flexed;Narrow base of support Gait velocity: Decreased Gait velocity interpretation: <1.31 ft/sec, indicative of household ambulator General Gait Details: pt not safe to ambulate with current incr weakness; RN and MD notified of this change   Stairs             Wheelchair Mobility    Modified Rankin (Stroke Patients Only)       Balance Overall balance assessment: Needs assistance Sitting-balance support: Feet supported Sitting balance-Leahy Scale: Fair     Standing balance support: Bilateral upper extremity supported;During functional activity Standing balance-Leahy Scale: Poor Standing balance comment: requires BUE on RW                            Cognition Arousal/Alertness: Awake/alert Behavior During Therapy: WFL for tasks assessed/performed Overall Cognitive Status: No family/caregiver present to determine baseline cognitive functioning                                 General Comments: Pt with many questions regarding who was in his room earlier from therapy. No other notes in chart from this morning - unsure who pt is referring to.       Exercises      General Comments        Pertinent Vitals/Pain Pain Assessment: Faces Faces Pain Scale: Hurts little more Pain Location: back and legs Pain Descriptors / Indicators: Discomfort;Grimacing;Aching Pain Intervention(s): Limited activity within patient's tolerance;Monitored during session;Repositioned    Home Living Family/patient expects to be discharged to::  Private residence Living Arrangements: Spouse/significant other Available Help at Discharge: Family Type of Home: House Home Access: [place]: One [place]: Hand held shower head;Grab bars - tub/shower;Shower seat - built  in Additional Comments: Pt reports that wife can help him with shoes if needed, but she does not like to be bothered    Prior Function Level of Independence: Independent  Gait / Transfers Assistance Needed: unable to ambulate since discharge   Comments: independent prior to surgery on 9/21   PT Goals (current goals can now be found in the care plan section) Acute Rehab PT Goals Patient Stated Goal: Decrease pain PT Goal Formulation: With patient Time For Goal Achievement: 09/27/19 Potential to Achieve Goals: Good Progress towards PT goals: Progressing toward goals    Frequency    Min 5X/week      PT Plan Current plan remains appropriate    Co-evaluation              AM-PAC PT "6 Clicks" Mobility   Outcome Measure  Help needed turning from your back to your side while in a flat bed without using bedrails?: A Little Help needed moving from lying on your back to sitting on the side of a flat bed without using bedrails?: A Lot Help needed moving to and from a bed to a chair (including a wheelchair)?: A Lot Help needed standing up from a chair using your arms (e.g., wheelchair or bedside chair)?: A Lot Help needed to walk in hospital room?: Total Help needed climbing 3-5 steps with a railing? : Total 6 Click Score: 11    End of Session Equipment Utilized During Treatment: Gait belt Activity Tolerance: Patient limited by fatigue Patient left: with call bell/phone within reach;Other (comment)(Sitting on toilet attempting to have a BM - NT notified) Nurse Communication: Mobility status;Other (comment)(NT aware that pt on commode having BM) PT Visit Diagnosis: Muscle weakness (generalized) (M62.81);Difficulty in walking, not elsewhere classified (R26.2);Pain Pain - part of body: (back)     Time: 7322-0254 PT Time Calculation (min) (ACUTE ONLY): 20 min  Charges:  $Gait Training: 8-22 mins                     Rolinda Roan, PT, DPT Acute Rehabilitation  Services Pager: [phone] Office: [phone]    Thelma Comp 09/23/2019, 11:32 AM

## 2019-09-23 NOTE — H&P (Signed)
Physical Medicine and Rehabilitation Admission H&P    Chief Complaint  Patient presents with   Back Pain    S/P Laminectomy  : HPI: Jerry York is a 68 year old right-handed male history of hypertension, rheumatoid arthritis, hyperlipidemia, type 2 diabetes mellitus, [name]  Per chart review lives with spouse.  1 level home 3 steps to entry.  Independent prior to most recent surgery 09/16/2019.  Presented 09/20/2019 after recent decompressive lumbar laminectomy medial facetectomy and foraminotomies L2-3, 3-4, 4-5 and L5-S1 per Dr. Sherley Bounds.  Discharge to home ambulating 250 feet minimal guard 09/18/2019.  Presented 09/20/2019 with increasing urinary retention and decreased mobility.  Follow-up imaging showed postoperative lumbar epidural hematoma.  Underwent lumbar reexploration with removal of epidural hematoma L2-S1 09/20/2019 per Dr. Ronnald Ramp.  Hospital course pain management.  No brace required.  Therapy evaluation completed and patient was admitted for a comprehensive rehab program.  Review of Systems  Constitutional: Negative for chills and fever.  HENT: Negative for hearing loss.   Eyes: Negative for blurred vision and double vision.  Respiratory: Negative for cough and shortness of breath.   Cardiovascular: Negative for chest pain and leg swelling.  Gastrointestinal: Positive for constipation. Negative for heartburn, nausea and vomiting.  Genitourinary: Negative for dysuria and flank pain.       Urinary retention  Musculoskeletal: Positive for back pain and myalgias.  Skin: Negative for rash.  Psychiatric/Behavioral:       Anxiety  All other systems reviewed and are negative.  Past Medical History:  Diagnosis Date   Anxiety    Bilateral renal cysts    Gout    02-22-2017 acute right foot gout---  per pt resolved   Gout    Gross hematuria    Heart murmur    History of BPH 08/14/2017   Hydronephrosis, left    Hyperlipidemia     Hypertension    Renal insufficiency    Rheumatoid arthritis ([place])    Swelling    Type 2 diabetes mellitus ([place])    Urinary retention    Wears glasses    Past Surgical History:  Procedure Laterality Date   APPENDECTOMY  age 36   CYSTOSCOPY WITH URETEROSCOPY AND STENT PLACEMENT Left 05/08/2017   Procedure: URETEROSCOPY AND STENT PLACEMENT;  Surgeon: Kathie Rhodes, MD;  Location: [facility];  Service: Urology;  Laterality: Left;   CYSTOSCOPY/RETROGRADE/URETEROSCOPY Bilateral 05/08/2017   Procedure: CYSTOSCOPY/ BILATERAL RETROGRADE;  Surgeon: Kathie Rhodes, MD;  Location: [facility];  Service: Urology;  Laterality: Bilateral;   HEMATOMA EVACUATION N/A 09/20/2019   Procedure: EVACUATION LUMBAR EPIDURAL HEMATOMA;  Surgeon: Newman Pies, MD;  Location: [place];  Service: Neurosurgery;  Laterality: N/A;  EVACUATION LUMBAR EPIDURAL HEMATOMA   INGUINAL HERNIA REPAIR Right 10/10/2000   KNEE ARTHROSCOPY Right 1980's   LUMBAR LAMINECTOMY/DECOMPRESSION MICRODISCECTOMY N/A 09/16/2019   Procedure: Laminectomy and Foraminotomy - Lumbar Two-Lumbar Three - Lumbar Three-Lumbar Four - Lumbar Four-Lumbar Five - Lumbar Five-Sacral One;  Surgeon: Eustace Moore, MD;  Location: [facility];  Service: Neurosurgery;  Laterality: N/A;  Laminectomy and Foraminotomy - Lumbar Two-Lumbar Three - Lumbar Three-Lumbar Four - Lumbar Four-Lumbar Five - Lumbar Five-Sacral One   NASAL FRACTURE SURGERY     in highschool    SHOULDER ARTHROSCOPY WITH OPEN ROTATOR CUFF REPAIR Right 1990's   TRANSURETHRAL RESECTION OF PROSTATE     Family History  Problem Relation Age of Onset   Congestive Heart Failure Mother  Related to valve disease.  Opted to treat medically   Hypertension Mother    Hyperlipidemia Mother    Diabetes Mother    Hypertension Father    Hyperlipidemia Father    Coronary artery disease Father 18       History of CABG   Stroke Father    Hypertension  Brother    Diabetes Brother    Hypertension Daughter    Hypertension Son    Hypertension Brother    Hypertension Brother    Social History:  reports that he has never smoked. He has never used smokeless tobacco. He reports that he does not drink alcohol or use drugs. Allergies:  Allergies  Allergen Reactions   Prednisone Other (See Comments)    Elevated blood sugar   Sulfa Antibiotics Other (See Comments)    "make my feet burn"   Azithromycin Nausea And Vomiting   Hydrocodone-Acetaminophen Itching   Metformin And Related Diarrhea   Medications Prior to Admission  Medication Sig Dispense Refill   allopurinol (ZYLOPRIM) 100 MG tablet Take 3 tablets (300 mg total) by mouth daily. (Patient taking differently: Take 100-200 mg by mouth See admin instructions. Take one tablet (100 mg) by mouth every morning and two tablets (200 mg) at night) 90 tablet 2   amLODipine (NORVASC) 5 MG tablet Take 1 tablet (5 mg total) by mouth daily. 90 tablet 1   atorvastatin (LIPITOR) 40 MG tablet Take 1 tablet (40 mg total) by mouth daily. (Patient taking differently: Take 40 mg by mouth at bedtime. ) 90 tablet 3   [name] Take 500 mg by mouth 2 (two) times daily.      clonazePAM (KLONOPIN) 0.5 MG tablet Take 1 tablet (0.5 mg total) by mouth 2 (two) times daily. 180 tablet 0   colchicine 0.6 MG tablet TAKE 1 TABLET BY MOUTH DAILY. TAKE 2 TIMES A DAY IF EXPERIENCING A FLARE (Patient taking differently: Take 0.6 mg by mouth See admin instructions. Take one tablet (0.6 mg) by mouth every morning, may take a 2nd dose at night if experiencing a gout flare) 90 tablet 0   cyclobenzaprine (FLEXERIL) 5 MG tablet Take 1 tablet (5 mg total) by mouth 3 (three) times daily as needed for muscle spasms. 30 tablet 0   furosemide (LASIX) 80 MG tablet Take 1 tablet (80 mg total) by mouth 2 (two) times daily. 180 tablet 3   insulin lispro (HUMALOG) 100 UNIT/ML injection Inject 50-80 Units into the skin See  admin instructions. Inject 80 units subcutaneously 30 minutes before breakfast and 50 units before lunch and supper     metoprolol tartrate (LOPRESSOR) 100 MG tablet Take 1 tablet (100 mg total) by mouth 2 (two) times daily. 180 tablet 3   oxyCODONE-acetaminophen (PERCOCET) 10-325 MG tablet Take 1 tablet by mouth every 4 (four) hours as needed for pain. 20 tablet 0   traZODone (DESYREL) 100 MG tablet Take 2 tablets (200 mg total) by mouth at bedtime as needed for sleep. (Patient taking differently: Take 200 mg by mouth at bedtime. ) 180 tablet 3   Continuous Blood Gluc Sensor ([name] 14 DAY SENSOR) MISC Apply 1 kit topically every 14 (fourteen) days. 6 each 3   glucose blood ([name] TEST STRIPS) test strip Check blood sugars 2-3 times per day. DX: E11.9 300 each 2   glucose monitoring kit ([name]) monitoring kit 1 each by Does not apply route as needed for other. DX Code: E11.9 1 each 0   HUMULIN R  U-500 KWIKPEN 500 UNIT/ML kwikpen Inject 60-80 Units as directed See admin instructions. INJECT 80 UNITS BEFORE BREAKFAST 60 UNITS BEFORE LUNCH AND 60 UNITS BEFORE EVENING MEAL. 30 MINUTES BEFORE MEALS     methocarbamol (ROBAXIN) 500 MG tablet Take 1 tablet (500 mg total) by mouth every 6 (six) hours as needed for muscle spasms. (Patient not taking: Reported on 09/20/2019) 60 tablet 1   oxyCODONE-acetaminophen (PERCOCET) 10-325 MG tablet Take 1 tablet by mouth every 4 (four) hours as needed for pain. (Patient not taking: Reported on 09/20/2019) 40 tablet 0    Drug Regimen Review Drug regimen was reviewed and remains appropriate with no significant issues identified  Home: Home Living Family/patient expects to be discharged to:: Private residence Living Arrangements: Spouse/significant other Available Help at Discharge: Family Type of Home: House Home Access: Ramped entrance Entrance Stairs-Number of Steps: [place]: One level Bathroom Shower/Tub: Gaffer,  Charity fundraiser: [place]: Hand held shower head, Grab bars - tub/shower, Shower seat - built in Additional Comments: Pt reports that wife can help him with shoes if needed, but she does not like to be bothered   Functional History: Prior Function Level of Independence: Independent Gait / Transfers Assistance Needed: unable to ambulate since discharge Comments: independent prior to surgery on 9/21  Functional Status:  Mobility: Bed Mobility Overal bed mobility: Needs Assistance Bed Mobility: Rolling, Sidelying to Sit Rolling: Supervision Sidelying to sit: Min guard Sit to sidelying: Min assist General bed mobility comments: Increased time and use of rails required to transition to EOB.  Transfers Overall transfer level: Needs assistance Equipment used: Rolling walker (2 wheeled) Transfers: Sit to/from Stand Sit to Stand: Min guard Stand pivot transfers: Min assist General transfer comment: Hands-on guarding required for power-up to full stand. VC's for hand placement on seated surface for safety, however pt reluctant to comply.  Ambulation/Gait Ambulation/Gait assistance: Min guard, +2 safety/equipment(chair follow) Gait Distance (Feet): 100 Feet(25', seated rest, 75') Assistive device: Rolling walker (2 wheeled) Gait Pattern/deviations: Step-through pattern, Decreased stride length, Trunk flexed, Narrow base of support General Gait Details: pt not safe to ambulate with current incr weakness; RN and MD notified of this change Gait velocity: Decreased Gait velocity interpretation: <1.31 ft/sec, indicative of household ambulator    ADL: ADL Overall ADL's : Needs assistance/impaired Eating/Feeding: Independent Grooming: Oral care, Set up, Sitting Grooming Details (indicate cue type and reason): Pt performed oral care sitting EOB with set up Upper Body Bathing: Supervision/ safety, Sitting Lower Body Bathing: Moderate assistance Upper Body Dressing :  Supervision/safety, Sitting Lower Body Dressing: Sit to/from stand, With adaptive equipment, Moderate assistance Lower Body Dressing Details (indicate cue type and reason): Provided education on use of AE for LB ADLs. Pt demonstrated understanding and partially donned pants with supervision, require mod A to stand and adjust LB clothing Toilet Transfer: Min guard, Minimal assistance, Ambulation, RW(simulated to recliner) Toilet Transfer Details (indicate cue type and reason): Pt required min A for power up to stand, min guard A for safety and balance Toileting- Clothing Manipulation and Hygiene: Minimal assistance, Sit to/from stand Functional mobility during ADLs: Min guard, Rolling walker General ADL Comments: Pt performed oral care and functional mobility with set up- min A for safety  Cognition: Cognition Overall Cognitive Status: No family/caregiver present to determine baseline cognitive functioning Orientation Level: Oriented X4 Cognition Arousal/Alertness: Awake/alert Behavior During Therapy: WFL for tasks assessed/performed Overall Cognitive Status: No family/caregiver present to determine baseline cognitive functioning General Comments: Pt with many  questions regarding who was in his room earlier from therapy. No other notes in chart from this morning - unsure who pt is referring to.   Physical Exam: Blood pressure (!) 130/59, pulse (!) 52, temperature 98.4 F (36.9 C), temperature source Oral, resp. rate 18, height _0  (1.651 m), weight 87.5 kg, SpO2 94 %. Physical Exam  Constitutional: He is oriented to person, place, and time. He appears well-developed and well-nourished. No distress.  HENT:  Head: Normocephalic and atraumatic.  Mouth/Throat: Oropharynx is clear and moist. No oropharyngeal exudate.  Eyes: Pupils are equal, round, and reactive to light. EOM are normal. Right eye exhibits no discharge. Left eye exhibits no discharge. No scleral icterus.  Neck: Normal range of  motion. No JVD present. No tracheal deviation present. No thyromegaly present.  Cardiovascular: Normal rate and regular rhythm. Exam reveals no friction rub.  No murmur heard. Respiratory: Effort normal. No respiratory distress. He has no wheezes. He has no rales.  GI: Soft. He exhibits no distension. There is no abdominal tenderness.  Genitourinary:    Genitourinary Comments: Foley catheter in place, clear yellow urine   Musculoskeletal: Normal range of motion.        General: No edema.     Comments: Low back TTP  Neurological: He is alert and oriented to person, place, and time. No cranial nerve deficit.  Normal insight and awareness. Motor 5/5 UE and 4/5 LE prox to distal. No sensory findings.   Skin: He is not diaphoretic.  Back incision clean and dry with Steri-Strips in place, wound well-approximated  Psychiatric: He has a normal mood and affect. His behavior is normal.    Results for orders placed or performed during the hospital encounter of 09/19/19 (from the past 48 hour(s))  Glucose, capillary     Status: Abnormal   Collection Time: 09/22/19  6:10 AM  Result Value Ref Range   Glucose-Capillary 314 (H) 70 - 99 mg/dL  Glucose, capillary     Status: Abnormal   Collection Time: 09/22/19 12:11 PM  Result Value Ref Range   Glucose-Capillary 302 (H) 70 - 99 mg/dL  Glucose, capillary     Status: Abnormal   Collection Time: 09/22/19  4:31 PM  Result Value Ref Range   Glucose-Capillary 340 (H) 70 - 99 mg/dL  Glucose, capillary     Status: Abnormal   Collection Time: 09/22/19  9:26 PM  Result Value Ref Range   Glucose-Capillary 275 (H) 70 - 99 mg/dL  Glucose, capillary     Status: Abnormal   Collection Time: 09/23/19  6:01 AM  Result Value Ref Range   Glucose-Capillary 352 (H) 70 - 99 mg/dL  Glucose, capillary     Status: Abnormal   Collection Time: 09/23/19 11:23 AM  Result Value Ref Range   Glucose-Capillary 330 (H) 70 - 99 mg/dL   Comment 1 Notify RN    Comment 2  Document in Chart   Glucose, capillary     Status: Abnormal   Collection Time: 09/23/19  4:25 PM  Result Value Ref Range   Glucose-Capillary 317 (H) 70 - 99 mg/dL  Glucose, capillary     Status: Abnormal   Collection Time: 09/23/19  9:28 PM  Result Value Ref Range   Glucose-Capillary 374 (H) 70 - 99 mg/dL   Comment 1 Notify RN    Comment 2 Document in Chart    No results found.     Medical Problem List and Plan: 1.  Decreased functional mobility secondary  to lumbar epidural hematoma status post removal of epidural hematoma L2 S1 09/20/2019 after decompressive lumbar laminectomy and foraminotomies L2-3, 3-4, L4-5, L5-S1 09/16/2019.  No back brace required  -admit to inpatient rehab 2.  Antithrombotics: -DVT/anticoagulation: SCD.  Check vascular not needed  -antiplatelet therapy: N/A 3. Pain Management: Oxycodone and Flexeril as needed 4. Mood: Klonopin 0.5 mg twice daily, trazodone 200 mg nightly as needed  -antipsychotic agents: N/A 5. Neuropsych: This patient is capable of making decisions on his own behalf. 6. Skin/Wound Care: Routine skin checks 7. Fluids/Electrolytes/Nutrition: Routine in and outs with follow-up chemistries 8.  Hypertension.  Norvasc 5 mg daily, Lasix 80 mg twice daily, Lopressor 100 mg twice daily  -monitor bp's with activity 9.  Urinary retention.  Remove foley at discharge  -begin voiding trial, check pvr's  -I/O caths prn. Was apparently I/O cathing at home 10.  Rheumatoid arthritis.  Mobic 7.5 mg twice daily 11.  CKD stage II.  Creatinine baseline 1.29-1.72.  Continue Lasix as prior to admission 12.  History of gout.  Colchicine 0.6 mg daily and Zyloprim 300 mg daily.    -no flare ups at present 13.  Diabetes mellitus.  Hemoglobin A1c 8.1.  SSI. With cbg's tid/ac&hs  -monitor for trends 14.  Constipation.  Laxative assistance     Cathlyn Parsons, PA-C 09/24/2019

## 2019-09-24 ENCOUNTER — Other Ambulatory Visit: Payer: Self-pay

## 2019-09-24 ENCOUNTER — Inpatient Hospital Stay (HOSPITAL_COMMUNITY)
Admission: RE | Admit: 2019-09-24 | Discharge: 2019-09-29 | DRG: 561 | Disposition: A | Discharge status: Home / Self Care | Payer: PPO | Source: Intra-hospital | Attending: Physical Medicine and Rehabilitation | Admitting: Physical Medicine and Rehabilitation

## 2019-09-24 ENCOUNTER — Encounter (HOSPITAL_COMMUNITY): Payer: Self-pay | Admitting: *Deleted

## 2019-09-24 DIAGNOSIS — S064X9A Epidural hemorrhage with loss of consciousness of unspecified duration, initial encounter: Secondary | ICD-10-CM | POA: Diagnosis not present

## 2019-09-24 DIAGNOSIS — R339 Retention of urine, unspecified: Secondary | ICD-10-CM

## 2019-09-24 DIAGNOSIS — N182 Chronic kidney disease, stage 2 (mild): Secondary | ICD-10-CM | POA: Diagnosis present

## 2019-09-24 DIAGNOSIS — E1122 Type 2 diabetes mellitus with diabetic chronic kidney disease: Secondary | ICD-10-CM | POA: Diagnosis present

## 2019-09-24 DIAGNOSIS — Z4789 Encounter for other orthopedic aftercare: Secondary | ICD-10-CM | POA: Diagnosis not present

## 2019-09-24 DIAGNOSIS — E785 Hyperlipidemia, unspecified: Secondary | ICD-10-CM | POA: Diagnosis present

## 2019-09-24 DIAGNOSIS — I129 Hypertensive chronic kidney disease with stage 1 through stage 4 chronic kidney disease, or unspecified chronic kidney disease: Secondary | ICD-10-CM | POA: Diagnosis present

## 2019-09-24 DIAGNOSIS — E669 Obesity, unspecified: Secondary | ICD-10-CM

## 2019-09-24 DIAGNOSIS — E119 Type 2 diabetes mellitus without complications: Secondary | ICD-10-CM

## 2019-09-24 DIAGNOSIS — N401 Enlarged prostate with lower urinary tract symptoms: Secondary | ICD-10-CM | POA: Diagnosis not present

## 2019-09-24 DIAGNOSIS — Z9889 Other specified postprocedural states: Secondary | ICD-10-CM

## 2019-09-24 DIAGNOSIS — M069 Rheumatoid arthritis, unspecified: Secondary | ICD-10-CM | POA: Diagnosis not present

## 2019-09-24 DIAGNOSIS — Z79899 Other long term (current) drug therapy: Secondary | ICD-10-CM

## 2019-09-24 DIAGNOSIS — M109 Gout, unspecified: Secondary | ICD-10-CM | POA: Diagnosis present

## 2019-09-24 DIAGNOSIS — D72819 Decreased white blood cell count, unspecified: Secondary | ICD-10-CM | POA: Diagnosis present

## 2019-09-24 DIAGNOSIS — Z833 Family history of diabetes mellitus: Secondary | ICD-10-CM

## 2019-09-24 DIAGNOSIS — R338 Other retention of urine: Secondary | ICD-10-CM | POA: Diagnosis present

## 2019-09-24 DIAGNOSIS — Z794 Long term (current) use of insulin: Secondary | ICD-10-CM | POA: Diagnosis not present

## 2019-09-24 DIAGNOSIS — K5900 Constipation, unspecified: Secondary | ICD-10-CM | POA: Diagnosis not present

## 2019-09-24 DIAGNOSIS — E1169 Type 2 diabetes mellitus with other specified complication: Secondary | ICD-10-CM

## 2019-09-24 DIAGNOSIS — N319 Neuromuscular dysfunction of bladder, unspecified: Secondary | ICD-10-CM | POA: Diagnosis present

## 2019-09-24 DIAGNOSIS — I10 Essential (primary) hypertension: Secondary | ICD-10-CM

## 2019-09-24 DIAGNOSIS — S064XAA Epidural hemorrhage with loss of consciousness status unknown, initial encounter: Secondary | ICD-10-CM | POA: Diagnosis present

## 2019-09-24 LAB — GLUCOSE, CAPILLARY
Glucose-Capillary: 346 mg/dL — ABNORMAL HIGH (ref 70–99)
Glucose-Capillary: 267 mg/dL — ABNORMAL HIGH (ref 70–99)
Glucose-Capillary: 180 mg/dL — ABNORMAL HIGH (ref 70–99)
Glucose-Capillary: 321 mg/dL — ABNORMAL HIGH (ref 70–99)

## 2019-09-24 MED ORDER — MORPHINE SULFATE (PF) 2 MG/ML IV SOLN
2.0000 mg | INTRAVENOUS | Status: DC | PRN
  Administered 2019-09-24 (×2): 2 mg via INTRAVENOUS
  Filled 2019-09-24 (×2): qty 1

## 2019-09-24 MED ORDER — SORBITOL 70 % SOLN: 30.0000 mL | Freq: Every day | Status: DC | PRN

## 2019-09-24 MED ORDER — SENNA 8.6 MG PO TABS
1.0000 | ORAL_TABLET | Freq: Two times a day (BID) | ORAL | Status: DC
  Administered 2019-09-24 – 2019-09-29 (×10): 8.6 mg via ORAL
  Filled 2019-09-24 (×9): qty 1

## 2019-09-24 MED ORDER — OXYCODONE-ACETAMINOPHEN 10-325 MG PO TABS: 1.0000 | ORAL_TABLET | Freq: Four times a day (QID) | ORAL | 0 refills | Status: DC | PRN

## 2019-09-24 MED ORDER — BISACODYL 10 MG RE SUPP: 10.0000 mg | Freq: Every day | RECTAL | Status: DC | PRN

## 2019-09-24 MED ORDER — CYCLOBENZAPRINE HCL 5 MG PO TABS
5.0000 mg | ORAL_TABLET | Freq: Three times a day (TID) | ORAL | Status: DC | PRN
  Administered 2019-09-28: 5 mg via ORAL
  Filled 2019-09-24: qty 1

## 2019-09-24 MED ORDER — INSULIN ASPART 100 UNIT/ML ~~LOC~~ SOLN
0.0000 [IU] | Freq: Three times a day (TID) | SUBCUTANEOUS | Status: DC
  Administered 2019-09-24 – 2019-09-25 (×3): 3 [IU] via SUBCUTANEOUS
  Administered 2019-09-25 – 2019-09-26 (×2): 5 [IU] via SUBCUTANEOUS
  Administered 2019-09-26: 3 [IU] via SUBCUTANEOUS
  Administered 2019-09-26: 1 [IU] via SUBCUTANEOUS
  Administered 2019-09-27 – 2019-09-28 (×4): 3 [IU] via SUBCUTANEOUS
  Administered 2019-09-28 (×2): 2 [IU] via SUBCUTANEOUS
  Administered 2019-09-29: 08:00:00 3 [IU] via SUBCUTANEOUS

## 2019-09-24 MED ORDER — ALLOPURINOL 100 MG PO TABS
100.0000 mg | ORAL_TABLET | Freq: Three times a day (TID) | ORAL | Status: DC
  Administered 2019-09-24 – 2019-09-29 (×14): 100 mg via ORAL
  Filled 2019-09-24 (×14): qty 1

## 2019-09-24 MED ORDER — MELOXICAM 7.5 MG PO TABS
7.5000 mg | ORAL_TABLET | Freq: Two times a day (BID) | ORAL | Status: DC
  Administered 2019-09-24 – 2019-09-28 (×9): 7.5 mg via ORAL
  Filled 2019-09-24 (×10): qty 1

## 2019-09-24 MED ORDER — OXYCODONE-ACETAMINOPHEN 5-325 MG PO TABS
1.0000 | ORAL_TABLET | ORAL | Status: DC | PRN
  Administered 2019-09-24: 2 via ORAL
  Administered 2019-09-24: 1 via ORAL
  Administered 2019-09-25: 2 via ORAL
  Administered 2019-09-25: 1 via ORAL
  Administered 2019-09-26 – 2019-09-28 (×5): 2 via ORAL
  Filled 2019-09-24: qty 2
  Filled 2019-09-24: qty 1
  Filled 2019-09-24: qty 2
  Filled 2019-09-24: qty 1
  Filled 2019-09-24 (×5): qty 2

## 2019-09-24 MED ORDER — ONDANSETRON HCL 4 MG PO TABS: 4.0000 mg | ORAL_TABLET | Freq: Four times a day (QID) | ORAL | Status: DC | PRN

## 2019-09-24 MED ORDER — FUROSEMIDE 40 MG PO TABS
80.0000 mg | ORAL_TABLET | Freq: Two times a day (BID) | ORAL | Status: DC
  Administered 2019-09-24 – 2019-09-29 (×10): 80 mg via ORAL
  Filled 2019-09-24 (×10): qty 2

## 2019-09-24 MED ORDER — ACETAMINOPHEN 325 MG PO TABS: 650.0000 mg | ORAL_TABLET | ORAL | Status: DC | PRN

## 2019-09-24 MED ORDER — INFLUENZA VAC A&B SA ADJ QUAD 0.5 ML IM PRSY: 0.5000 mL | PREFILLED_SYRINGE | INTRAMUSCULAR | Status: DC

## 2019-09-24 MED ORDER — POLYETHYLENE GLYCOL 3350 17 G PO PACK
17.0000 g | PACK | Freq: Every day | ORAL | Status: DC
  Administered 2019-09-25 – 2019-09-28 (×3): 17 g via ORAL
  Filled 2019-09-24 (×5): qty 1

## 2019-09-24 MED ORDER — ACETAMINOPHEN 650 MG RE SUPP: 650.0000 mg | RECTAL | Status: DC | PRN

## 2019-09-24 MED ORDER — CLONAZEPAM 0.5 MG PO TABS
0.5000 mg | ORAL_TABLET | Freq: Two times a day (BID) | ORAL | Status: DC
  Administered 2019-09-24 – 2019-09-29 (×10): 0.5 mg via ORAL
  Filled 2019-09-24 (×10): qty 1

## 2019-09-24 MED ORDER — OXYCODONE HCL 5 MG PO TABS
5.0000 mg | ORAL_TABLET | ORAL | Status: DC | PRN
  Administered 2019-09-24 – 2019-09-27 (×7): 5 mg via ORAL
  Filled 2019-09-24 (×7): qty 1

## 2019-09-24 MED ORDER — INSULIN ASPART 100 UNIT/ML ~~LOC~~ SOLN
5.0000 [IU] | Freq: Once | SUBCUTANEOUS | Status: AC
  Administered 2019-09-24: 5 [IU] via SUBCUTANEOUS

## 2019-09-24 MED ORDER — ONDANSETRON HCL 4 MG/2ML IJ SOLN: 4.0000 mg | Freq: Four times a day (QID) | INTRAMUSCULAR | Status: DC | PRN

## 2019-09-24 MED ORDER — METOPROLOL TARTRATE 50 MG PO TABS
100.0000 mg | ORAL_TABLET | Freq: Two times a day (BID) | ORAL | Status: DC
  Administered 2019-09-24 – 2019-09-29 (×10): 100 mg via ORAL
  Filled 2019-09-24 (×10): qty 2

## 2019-09-24 MED ORDER — METHOCARBAMOL 500 MG PO TABS: 500.0000 mg | ORAL_TABLET | Freq: Four times a day (QID) | ORAL | 0 refills | Status: DC

## 2019-09-24 MED ORDER — COLCHICINE 0.6 MG PO TABS
0.6000 mg | ORAL_TABLET | Freq: Every day | ORAL | Status: DC
  Administered 2019-09-25 – 2019-09-29 (×5): 0.6 mg via ORAL
  Filled 2019-09-24 (×5): qty 1

## 2019-09-24 MED ORDER — TRAZODONE HCL 50 MG PO TABS
200.0000 mg | ORAL_TABLET | Freq: Every evening | ORAL | Status: DC | PRN
  Administered 2019-09-24 – 2019-09-28 (×4): 200 mg via ORAL
  Filled 2019-09-24 (×4): qty 4

## 2019-09-24 MED ORDER — AMLODIPINE BESYLATE 5 MG PO TABS
5.0000 mg | ORAL_TABLET | Freq: Every day | ORAL | Status: DC
  Administered 2019-09-25 – 2019-09-29 (×5): 5 mg via ORAL
  Filled 2019-09-24 (×5): qty 1

## 2019-09-24 NOTE — Progress Notes (Signed)
Inpatient Rehabilitation  Patient information reviewed and entered into eRehab system by Ipek Westra M. Leighton Brickley, M.A., CCC/SLP, PPS Coordinator.  Information including medical coding, functional ability and quality indicators will be reviewed and updated through discharge.    

## 2019-09-24 NOTE — Progress Notes (Signed)
Meredith Staggers, MD  Physician  Physical Medicine and Rehabilitation  PMR Pre-admission  Signed  Date of Service:  09/24/2019 1:03 PM      Related encounter: ED to Hosp-Admission (Discharged) from 09/19/2019 in [facility]      Signed         Show:Clear all _0 Manual_1 Template_2 Copied  Added by: _3 Cristina Gong, RN_4 Meredith Staggers, MD  _5 Hover for details PMR Admission Coordinator Pre-Admission Assessment  Patient: TAYSHUN GAPPA is an 68 y.o., male MRN: [id number] DOB: 22-Jan-1951 Height: _6  (165.1 cm) Weight: 87.5 kg  Insurance Information HMO: PPO: yes     PCP:      IPA:      80/20:      OTHER: medicare advantage plan PRIMARY: [employer]      Policy#: [id number]      Subscriber: pt CM Name: Liam Rogers      Phone#: [phone]     Fax#: Epic access Pre-Cert#: [id number] approved for 7 days     Employer:  Benefits:  Phone #: [phone]     Name: 9/29 Eff. Date: 12/26/2018     Deduct: none      Out of Pocket Max: $3400      Life Max: none CIR: $295 co pay per day days 1 until 6      SNF: $20 co pay per day days 1 until 20; $160 co pay per day days 21 until 100 Outpatient: $15 co pay per visit     Co-Pay: visits per medical neccesity Home Health: 100%      Co-Pay: visits per medical neccesity DME: 80%     Co-Pay: 20% Providers: in network  SECONDARY: none       Medicaid Application Date:       Case Manager:  Disability Application Date:       Case Worker:   The Data Collection Information Summary for patients in Inpatient Rehabilitation Facilities with attached Privacy Act [place] Records was provided and verbally reviewed with: Patient  Emergency Contact Information         Contact Information    Name Relation Home Work Mobile   Duque Spouse [phone]  [phone]   Creason,Christina Daughter [phone]        Current Medical History  Patient Admitting  Diagnosis: epidural hematoma s/p laminectomy  History of Present Illness:  Rorke is a 68 year old right-handed male history of hypertension, rheumatoid arthritis, hyperlipidemia, type 2 diabetes mellitus, [name] Per chart review lives with spouse. 1 level home 3 steps to entry. Independent prior to most recent surgery 09/16/2019. Presented 09/20/2019 after recent decompressive lumbar laminectomy medial facetectomyand foraminotomies L2-3, 3-4, 4-5 and L5-S1 per Dr. Sherley Bounds. Discharge to home ambulating 250 feet minimal guard 09/18/2019. Presented 09/20/2019 with increasing urinary retention and decreased mobility. Follow-up imaging showed postoperative lumbar epidural hematoma. Underwent lumbar reexploration with removal of epidural hematoma L2-S1 09/20/2019 per Dr. Ronnald Ramp. Hospital course pain management. No brace required.   Patient's medical record from [facility]  has been reviewed by the rehabilitation admission coordinator and physician.  Past Medical History      Past Medical History:  Diagnosis Date   Anxiety    Bilateral renal cysts    Gout    02-22-2017 acute right foot gout---  per pt resolved   Gout    Gross hematuria    Heart murmur    History of BPH 08/14/2017   Hydronephrosis, left  Hyperlipidemia    Hypertension    Renal insufficiency    Rheumatoid arthritis (HCC)    Swelling    Type 2 diabetes mellitus (HCC)    Urinary retention    Wears glasses     Family History   family history includes Congestive Heart Failure in his mother; Coronary artery disease (age of onset: 3) in his father; Diabetes in his brother and mother; Hyperlipidemia in his father and mother; Hypertension in his brother, brother, brother, daughter, father, mother, and son; Stroke in his father.  Prior Rehab/Hospitalizations Has the patient had prior rehab or hospitalizations prior to admission? Yes [facility] 10/2018 for 6 days  Has the patient had major surgery during 100 days prior to admission? Yes             Current Medications  Current Facility-Administered Medications:    0.9 %  sodium chloride infusion, 250 mL, Intravenous, Continuous, Eustace Moore, MD   0.9 % NaCl with KCl 20 mEq/ L  infusion, , Intravenous, Continuous, Eustace Moore, MD, Last Rate: 10 mL/hr at 09/23/19 1008   acetaminophen (TYLENOL) tablet 650 mg, 650 mg, Oral, Q4H PRN **OR** acetaminophen (TYLENOL) suppository 650 mg, 650 mg, Rectal, Q4H PRN, Eustace Moore, MD   allopurinol (ZYLOPRIM) tablet 100 mg, 100 mg, Oral, TID, Eustace Moore, MD, 100 mg at 09/24/19 1048   amLODipine (NORVASC) tablet 5 mg, 5 mg, Oral, Daily, Eustace Moore, MD, 5 mg at 09/24/19 1047   bisacodyl (DULCOLAX) suppository 10 mg, 10 mg, Rectal, Daily PRN, Eustace Moore, MD   Chlorhexidine Gluconate Cloth 2 % PADS 6 each, 6 each, Topical, Daily, Arrien, Jimmy Picket, MD, 6 each at 09/24/19 1048   clonazePAM (KLONOPIN) tablet 0.5 mg, 0.5 mg, Oral, BID, Eustace Moore, MD, 0.5 mg at 09/24/19 1047   colchicine tablet 0.6 mg, 0.6 mg, Oral, Daily, Eustace Moore, MD, 0.6 mg at 09/24/19 1048   cyclobenzaprine (FLEXERIL) tablet 5 mg, 5 mg, Oral, TID PRN, Eustace Moore, MD, 5 mg at 09/23/19 1638   furosemide (LASIX) tablet 80 mg, 80 mg, Oral, BID, Eustace Moore, MD, 80 mg at 09/24/19 0847   [START ON 09/25/2019] influenza vaccine adjuvanted (FLUAD) injection 0.5 mL, 0.5 mL, Intramuscular, Tomorrow-1000, Eustace Moore, MD   insulin aspart (novoLOG) injection 0-15 Units, 0-15 Units, Subcutaneous, TID WC, Eustace Moore, MD, 8 Units at 09/24/19 1201   magnesium citrate solution 1 Bottle, 1 Bottle, Oral, Once, Eustace Moore, MD   meloxicam [facility]) tablet 7.5 mg, 7.5 mg, Oral, BID, Eustace Moore, MD, 7.5 mg at 09/24/19 1047   menthol-cetylpyridinium (CEPACOL) lozenge 3 mg, 1 lozenge, Oral, PRN **OR** phenol (CHLORASEPTIC) mouth  spray 1 spray, 1 spray, Mouth/Throat, PRN, Eustace Moore, MD   metoprolol tartrate (LOPRESSOR) tablet 100 mg, 100 mg, Oral, BID, Eustace Moore, MD, 100 mg at 09/24/19 1047   morphine 2 MG/ML injection 2 mg, 2 mg, Intravenous, Q2H PRN, Eustace Moore, MD, 2 mg at 09/24/19 1045   ondansetron (ZOFRAN) tablet 4 mg, 4 mg, Oral, Q6H PRN **OR** ondansetron (ZOFRAN) injection 4 mg, 4 mg, Intravenous, Q6H PRN, Eustace Moore, MD   oxyCODONE-acetaminophen (PERCOCET/ROXICET) 5-325 MG per tablet 1-2 tablet, 1-2 tablet, Oral, Q4H PRN, 2 tablet at 09/22/19 1811 **AND** oxyCODONE (Oxy IR/ROXICODONE) immediate release tablet 5 mg, 5 mg, Oral, Q4H PRN, Eustace Moore, MD, 5 mg at 09/23/19 0959   polyethylene glycol (MIRALAX / GLYCOLAX)  packet 17 g, 17 g, Oral, Daily, Costella, Vista Mink, PA-C, 17 g at 09/22/19 1811   senna (SENOKOT) tablet 8.6 mg, 1 tablet, Oral, BID, Eustace Moore, MD, 8.6 mg at 09/24/19 1047   sodium chloride flush (NS) 0.9 % injection 3 mL, 3 mL, Intravenous, Q12H, Eustace Moore, MD, 3 mL at 09/24/19 1049   sodium chloride flush (NS) 0.9 % injection 3 mL, 3 mL, Intravenous, PRN, Eustace Moore, MD   traZODone (DESYREL) tablet 200 mg, 200 mg, Oral, QHS PRN, Eustace Moore, MD, 200 mg at 09/23/19 2216  Patients Current Diet:     Diet Order                  Diet heart healthy/carb modified Room service appropriate? Yes; Fluid consistency: Thin  Diet effective now               Precautions / Restrictions Precautions Precautions: Fall, Back Precaution Booklet Issued: No Precaution Comments: Reviewed precautions during functional mobility Restrictions Weight Bearing Restrictions: No   Has the patient had 2 or more falls or a fall with injury in the past year? No  Prior Activity Level Limited Community (1-2x/wk): Mod I with cane; driving  Prior Functional Level Self Care: Did the patient need help bathing, dressing, using the toilet or eating?  Independent  Indoor Mobility: Did the patient need assistance with walking from room to room (with or without device)? Independent  Stairs: Did the patient need assistance with internal or external stairs (with or without device)? Independent  Functional Cognition: Did the patient need help planning regular tasks such as shopping or remembering to take medications? Independent  Home Assistive Devices / Equipment Home Assistive Devices/Equipment: Eyeglasses, CBG Meter, Blood pressure cuff, Walker (specify type), Cane (specify quad or straight), Grab bars in shower, Built-in shower seat Home Equipment: Hand held shower head, Grab bars - tub/shower, Shower seat - built in  Prior Device Use: Indicate devices/aids used by the patient prior to current illness, exacerbation or injury? cane  Current Functional Level Cognition  Overall Cognitive Status: No family/caregiver present to determine baseline cognitive functioning Orientation Level: Oriented X4 General Comments: Pt with many questions regarding who was in his room earlier from therapy. No other notes in chart from this morning - unsure who pt is referring to.     Extremity Assessment (includes Sensation/Coordination)  Upper Extremity Assessment: Overall WFL for tasks assessed  Lower Extremity Assessment: Defer to PT evaluation RLE Deficits / Details: ankle AROM WFL, strength hip flexion 3-/5, knee extension 4-/5 LLE Deficits / Details: ankle AROM WFL, strength hip flexion 3-/5, knee extension 4-/5    ADLs  Overall ADL's : Needs assistance/impaired Eating/Feeding: Independent Grooming: Oral care, Set up, Sitting Grooming Details (indicate cue type and reason): Pt performed oral care sitting EOB with set up Upper Body Bathing: Supervision/ safety, Sitting Lower Body Bathing: Moderate assistance Upper Body Dressing : Supervision/safety, Sitting Lower Body Dressing: Sit to/from stand, With adaptive equipment, Moderate  assistance Lower Body Dressing Details (indicate cue type and reason): Provided education on use of AE for LB ADLs. Pt demonstrated understanding and partially donned pants with supervision, require mod A to stand and adjust LB clothing Toilet Transfer: Min guard, Minimal assistance, Ambulation, RW(simulated to recliner) Toilet Transfer Details (indicate cue type and reason): Pt required min A for power up to stand, min guard A for safety and balance Toileting- Clothing Manipulation and Hygiene: Minimal assistance, Sit to/from stand Functional mobility during  ADLs: Min guard, Rolling walker General ADL Comments: Pt performed oral care and functional mobility with set up- min A for safety    Mobility  Overal bed mobility: Needs Assistance Bed Mobility: Rolling, Sidelying to Sit Rolling: Supervision Sidelying to sit: Min guard Sit to sidelying: Min assist General bed mobility comments: Increased time and use of rails required to transition to EOB.     Transfers  Overall transfer level: Needs assistance Equipment used: Rolling walker (2 wheeled) Transfers: Sit to/from Stand Sit to Stand: Min guard Stand pivot transfers: Min assist General transfer comment: Hands-on guarding required for power-up to full stand. VC's for hand placement on seated surface for safety, however pt reluctant to comply.     Ambulation / Gait / Stairs / Wheelchair Mobility  Ambulation/Gait Ambulation/Gait assistance: Min guard, +2 safety/equipment(chair follow) Gait Distance (Feet): 100 Feet(25', seated rest, 75') Assistive device: Rolling walker (2 wheeled) Gait Pattern/deviations: Step-through pattern, Decreased stride length, Trunk flexed, Narrow base of support General Gait Details: pt not safe to ambulate with current incr weakness; RN and MD notified of this change Gait velocity: Decreased Gait velocity interpretation: <1.31 ft/sec, indicative of household ambulator    Posture / Balance  Balance Overall balance assessment: Needs assistance Sitting-balance support: Feet supported Sitting balance-Leahy Scale: Fair Standing balance support: Bilateral upper extremity supported, During functional activity Standing balance-Leahy Scale: Poor Standing balance comment: requires BUE on RW    Special needs/care consideration BiPAP/CPAP n/a CPM  N/a Continuous Drip IV  N/a Dialysis n/a Life Vest  N/a Oxygen  N/a Special Bed  N/a Trach Size  N/a Wound Vac n/a Skin surgical dressing Bowel mgmt:  Continent LBM 9/20 Bladder mgmt: 9/25 non latex #16 french placed Diabetic mgmt: yes Behavioral consideration  N/a Chemo/radiation  N/a Designated visitor is wife Malachy Mood   Previous Home Environment  Living Arrangements: Spouse/significant other  Lives With: Spouse Available Help at Discharge: Family, Available 24 hours/day(wife) Type of Home: House Home Layout: One level Home Access: Ramped entrance Entrance Stairs-Number of Steps: 3 Bathroom Shower/Tub: Gaffer, Charity fundraiser: Standard Bathroom Accessibility: Yes How Accessible: Accessible via walker Home Care Services: No Additional Comments: Pt reports that wife can help him with shoes if needed, but she does not like to be bothered  Discharge Living Setting Plans for Discharge Living Setting: Patient's home, Lives with (comment)(wife) Type of Home at Discharge: House Discharge Home Layout: One level Discharge Home Access: Ramped entrance Discharge Bathroom Shower/Tub: Walk-in shower, Door Discharge Bathroom Toilet: Standard Discharge Bathroom Accessibility: Yes How Accessible: Accessible via walker Does the patient have any problems obtaining your medications?: No  Social/Family/Support Systems Patient Roles: Spouse Contact Information: wife, Malachy Mood Anticipated Caregiver: wife Anticipated Ambulance person Information: see above Ability/Limitations of Caregiver: wife has shoulder issues so can  not lift him or pull on him Caregiver Availability: 24/7 Discharge Plan Discussed with Primary Caregiver: Yes Is Caregiver In Agreement with Plan?: No Does Caregiver/Family have Issues with Lodging/Transportation while Pt is in Rehab?: No  Goals/Additional Needs Patient/Family Goal for Rehab: Mod I with PT and OT Expected length of stay: ELOS 7 days Pt/Family Agrees to Admission and willing to participate: Yes Program Orientation Provided & Reviewed with Pt/Caregiver Including Roles  & Responsibilities: Yes  Decrease burden of Care through IP rehab admission: n/a  Possible need for SNF placement upon discharge: not anticipated  Patient Condition: I have reviewed medical records from [facility] , spoken with CM, and patient. I met with patient at the  bedside for inpatient rehabilitation assessment.  Patient will benefit from ongoing PT and OT, can actively participate in 3 hours of therapy a day 5 days of the week, and can make measurable gains during the admission.  Patient will also benefit from the coordinated team approach during an Inpatient Acute Rehabilitation admission.  The patient will receive intensive therapy as well as Rehabilitation physician, nursing, social worker, and care management interventions.  Due to bladder management, bowel management, safety, skin/wound care, disease management, medication administration, pain management and patient education the patient requires 24 hour a day rehabilitation nursing.  The patient is currently min assist with mobility and basic ADLs.  Discharge setting and therapy post discharge at home with home health is anticipated.  Patient has agreed to participate in the Acute Inpatient Rehabilitation Program and will admit today.  Preadmission Screen Completed By:  Cleatrice Burke, 09/24/2019 1:04 PM ______________________________________________________________________   Discussed status with Dr. Naaman Plummer  on  09/24/2019 at  1317  and received approval for admission today.  Admission Coordinator:  Cleatrice Burke, RN, time  7282 Date  09/24/2019   Assessment/Plan: Diagnosis: lumbar epidural hematoma 1. Does the need for close, 24 hr/day Medical supervision in concert with the patient's rehab needs make it unreasonable for this patient to be served in a less intensive setting? Yes 2. Co-Morbidities requiring supervision/potential complications: RA, HTN, DM, CKD 3. Due to bladder management, bowel management, safety, skin/wound care, disease management, medication administration, pain management and patient education, does the patient require 24 hr/day rehab nursing? Yes 4. Does the patient require coordinated care of a physician, rehab nurse, PT (1-2 hrs/day, 5 days/week) and OT (1-2 hrs/day, 5 days/week) to address physical and functional deficits in the context of the above medical diagnosis(es)? Yes Addressing deficits in the following areas: balance, endurance, locomotion, strength, transferring, bowel/bladder control, bathing, dressing, feeding, grooming, toileting and psychosocial support 5. Can the patient actively participate in an intensive therapy program of at least 3 hrs of therapy 5 days a week? Yes 6. The potential for patient to make measurable gains while on inpatient rehab is excellent 7. Anticipated functional outcomes upon discharge from inpatients are: modified independent PT, modified independent OT, n/a SLP 8. Estimated rehab length of stay to reach the above functional goals is: 7 days 9. Anticipated D/C setting: Home 10. Anticipated post D/C treatments: [place] therapy 11. Overall Rehab/Functional Prognosis: excellent  MD Signature: Meredith Staggers, MD, [facility] 09/24/2019         Revision History

## 2019-09-24 NOTE — Progress Notes (Addendum)
Pt transfer to Rehab. Pt is on room air and is stable with no new concerns. Transfer report given to Nurse on Rehab unit. Pt is transferring with his foley cath due to urinary retention.    Nurse d/c foley before transferring pt to rehab per order.

## 2019-09-24 NOTE — Discharge Summary (Signed)
Physician Discharge Summary  Patient ID: Jerry York MRN: [id number] DOB/AGE: [birth year] 68 y.o.  Admit date: 09/19/2019 Discharge date: 09/24/2019  Admission Diagnoses: lumbar epidural hematoma   Discharge Diagnoses: same   Discharged Condition: good  Hospital Course: The patient was admitted on 09/19/2019 and taken to the operating room where the patient underwent reexploration of lumbar wound for evacuation of epidural hematoma. The patient tolerated the procedure well and was taken to the recovery room and then to the floor in stable condition. The hospital course was routine. There were no complications. The wound remained clean dry and intact. Pt had appropriate back soreness. No complaints of leg pain or new N/T/W. The patient remained afebrile with stable vital signs, and tolerated a regular diet. The patient continued to increase activities, and pain was well controlled with oral pain medications.   Consults: None  Significant Diagnostic Studies:  Results for orders placed or performed during the hospital encounter of 09/19/19  SARS Coronavirus 2 [facility] order, Performed in [facility] hospital lab) Nasopharyngeal Nasopharyngeal Swab   Specimen: Nasopharyngeal Swab  Result Value Ref Range   SARS Coronavirus 2 NEGATIVE NEGATIVE  CBC with Differential  Result Value Ref Range   WBC 6.4 4.0 - 10.5 K/uL   RBC 4.37 4.22 - 5.81 MIL/uL   Hemoglobin 13.1 13.0 - 17.0 g/dL   HCT 39.4 39.0 - 52.0 %   MCV 90.2 80.0 - 100.0 fL   MCH 30.0 26.0 - 34.0 pg   MCHC 33.2 30.0 - 36.0 g/dL   RDW 16.3 (H) 11.5 - 15.5 %   Platelets 125 (L) 150 - 400 K/uL   nRBC 0.0 0.0 - 0.2 %   Neutrophils Relative % 63 %   Neutro Abs 4.0 1.7 - 7.7 K/uL   Lymphocytes Relative 28 %   Lymphs Abs 1.8 0.7 - 4.0 K/uL   Monocytes Relative 8 %   Monocytes Absolute 0.5 0.1 - 1.0 K/uL   Eosinophils Relative 0 %   Eosinophils Absolute 0.0 0.0 - 0.5 K/uL   Basophils Relative 0 %   Basophils Absolute 0.0 0.0 -  0.1 K/uL   Immature Granulocytes 1 %   Abs Immature Granulocytes 0.04 0.00 - 0.07 K/uL  Comprehensive metabolic panel  Result Value Ref Range   Sodium 141 135 - 145 mmol/L   Potassium 3.3 (L) 3.5 - 5.1 mmol/L   Chloride 100 98 - 111 mmol/L   CO2 27 22 - 32 mmol/L   Glucose, Bld 72 70 - 99 mg/dL   BUN 48 (H) 8 - 23 mg/dL   Creatinine, Ser 1.72 (H) 0.61 - 1.24 mg/dL   Calcium 8.6 (L) 8.9 - 10.3 mg/dL   Total Protein 6.4 (L) 6.5 - 8.1 g/dL   Albumin 3.3 (L) 3.5 - 5.0 g/dL   AST 24 15 - 41 U/L   ALT 19 0 - 44 U/L   Alkaline Phosphatase 57 38 - 126 U/L   Total Bilirubin 0.6 0.3 - 1.2 mg/dL   GFR calc non Af Amer 40 (L) >60 mL/min   GFR calc Af Amer 47 (L) >60 mL/min   Anion gap 14 5 - 15  Magnesium  Result Value Ref Range   Magnesium 2.0 1.7 - 2.4 mg/dL  CK  Result Value Ref Range   Total CK 258 49 - 397 U/L  Urinalysis, Routine w reflex microscopic  Result Value Ref Range   Color, Urine YELLOW YELLOW   APPearance CLEAR CLEAR   Specific Gravity, Urine  1.011 1.005 - 1.030   pH 5.0 5.0 - 8.0   Glucose, UA NEGATIVE NEGATIVE mg/dL   Hgb urine dipstick NEGATIVE NEGATIVE   Bilirubin Urine NEGATIVE NEGATIVE   Ketones, ur NEGATIVE NEGATIVE mg/dL   Protein, ur NEGATIVE NEGATIVE mg/dL   Nitrite NEGATIVE NEGATIVE   Leukocytes,Ua NEGATIVE NEGATIVE  Glucose, capillary  Result Value Ref Range   Glucose-Capillary 151 (H) 70 - 99 mg/dL  Glucose, capillary  Result Value Ref Range   Glucose-Capillary 199 (H) 70 - 99 mg/dL   Comment 1 Notify RN    Comment 2 Document in Chart   Glucose, capillary  Result Value Ref Range   Glucose-Capillary 235 (H) 70 - 99 mg/dL  Glucose, capillary  Result Value Ref Range   Glucose-Capillary 178 (H) 70 - 99 mg/dL  Glucose, capillary  Result Value Ref Range   Glucose-Capillary 168 (H) 70 - 99 mg/dL  Glucose, capillary  Result Value Ref Range   Glucose-Capillary 216 (H) 70 - 99 mg/dL  Glucose, capillary  Result Value Ref Range   Glucose-Capillary  320 (H) 70 - 99 mg/dL  Glucose, capillary  Result Value Ref Range   Glucose-Capillary 314 (H) 70 - 99 mg/dL  Glucose, capillary  Result Value Ref Range   Glucose-Capillary 302 (H) 70 - 99 mg/dL  Glucose, capillary  Result Value Ref Range   Glucose-Capillary 340 (H) 70 - 99 mg/dL  Glucose, capillary  Result Value Ref Range   Glucose-Capillary 275 (H) 70 - 99 mg/dL  Glucose, capillary  Result Value Ref Range   Glucose-Capillary 352 (H) 70 - 99 mg/dL  Glucose, capillary  Result Value Ref Range   Glucose-Capillary 330 (H) 70 - 99 mg/dL   Comment 1 Notify RN    Comment 2 Document in Chart   Glucose, capillary  Result Value Ref Range   Glucose-Capillary 317 (H) 70 - 99 mg/dL  Glucose, capillary  Result Value Ref Range   Glucose-Capillary 374 (H) 70 - 99 mg/dL   Comment 1 Notify RN    Comment 2 Document in Chart   Glucose, capillary  Result Value Ref Range   Glucose-Capillary 346 (H) 70 - 99 mg/dL   Comment 1 Notify RN    Comment 2 Document in Chart   Glucose, capillary  Result Value Ref Range   Glucose-Capillary 267 (H) 70 - 99 mg/dL   Comment 1 Notify RN    Comment 2 Document in Chart     Chest 2 View  Result Date: 09/13/2019 CLINICAL DATA:  Spinal stenosis.  Preoperative study. EXAM: CHEST - 2 VIEW COMPARISON:  Chest x-ray 09/05/2018. FINDINGS: Mediastinum hilar structures normal. Stable bibasilar subsegmental atelectasis/scarring. No acute infiltrate. No pleural effusion pneumothorax. Stable cardiomegaly. No pulmonary venous congestion. Degenerative change thoracic spine. IMPRESSION: 1. Stable bibasilar subsegmental atelectasis/scarring. No acute pulmonary abnormality. 2.  Stable cardiomegaly.  No pulmonary venous congestion. Electronically Signed   By: Marcello Moores  Register   On: 09/13/2019 06:18   Mr Lumbar Spine W Wo Contrast  Result Date: 09/20/2019 CLINICAL DATA:  Initial evaluation for worsening back pain with lower extremity weakness, recent surgery. EXAM: MRI LUMBAR  SPINE WITHOUT AND WITH CONTRAST TECHNIQUE: Multiplanar and multiecho pulse sequences of the lumbar spine were obtained without and with intravenous contrast. CONTRAST:  73m GADAVIST GADOBUTROL 1 MMOL/ML IV SOLN COMPARISON:  Prior MRI from 07/25/2019. FINDINGS: Segmentation: Standard. Lowest well-formed disc labeled the L5-S1 level. Alignment: Trace anterolisthesis of L5 on S1, stable. Alignment otherwise normal with preservation of the normal  lumbar lordosis. Vertebrae: Chronic height loss at the superior endplate of Q28 with associated Schmorl's node deformity, stable. Vertebral body height otherwise maintained without acute or interval fracture. Bone marrow signal intensity mildly heterogeneous but within normal limits. Benign hemangioma noted within the L1 vertebral body. No worrisome osseous lesions. Postoperative changes from recent decompressive laminectomy at L2-3 through L5-S1. No evidence for osteomyelitis discitis. Conus medullaris and cauda equina: Conus extends to the inferior aspect of the L1 level. Conus medullaris within normal limits. Nerve roots of the cauda equina are compressed anteriorly by a posterior intradural or subdural collection extending from L2-3 through L4-5. Collection is positioned at the left posterolateral aspect of the thecal sac, and measures 1.2 x 1.6 x 8.0 cm in greatest dimensions (AP by transverse by craniocaudad). This is positioned at the site of recent laminectomy, and suspicious for a possible CSF leak with subsequent collection. Paraspinal and other soft tissues: Postoperative changes from recent laminectomy present within the posterior paraspinous soft tissues. Small fairly linear T2 hyperintense collection seen along the lower midline incision measures 3.9 x 9.5 cm (series 8, image 10). 4.9 cm simple cyst within the right retroperitoneum likely reflects a partially visualized exophytic right renal cyst. Visualized visceral structures otherwise unremarkable. Disc levels:  L1-2: Disc desiccation with mild disc bulge, asymmetric to the right. Mild right worse than left facet hypertrophy. No stenosis. L2-3: Mild disc bulging. Prior posterior decompression. Collection at the posterior thecal sac with secondary compression of the thecal sac anteriorly. Severe stenosis with the thecal sac measuring 4 mm in AP diameter. Residual bilateral facet hypertrophy. Foramina remain patent. L3-4: Diffuse disc bulge. Interval laminectomy. Posterior collection with compression of the thecal sac anteriorly and resultant severe spinal stenosis. Moderate facet hypertrophy with associated joint effusion on the left. Moderate bilateral L3 foraminal stenosis. L4-5: Disc desiccation without disc bulge. Decompressive laminectomy. Collection at the posterior thecal sac with resultant severe stenosis. Residual facet hypertrophy. Foramina remain patent. L5-S1: Trace anterolisthesis. Mild disc bulge. Decompressive laminectomy. 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 No stenosis. IMPRESSION: Postoperative changes from recent laminectomy at L2-3 through L5-S1 with posterior intradural or subdural collection extending from L2-3 through L4-5, suspicious for a possible CSF leak with subsequent collection. Resultant severe diffuse spinal stenosis with compression of the nerve roots of the cauda equina. Emergent neuro surgical consultation recommended. Critical Value/emergent results were called by telephone at the time of interpretation on 09/20/2019 at 3:23 am to [place] , who verbally acknowledged these results. Electronically Signed   By: Jeannine Boga M.D.   On: 09/20/2019 03:28   Dg Lumbar Spine 1 View  Result Date: 09/16/2019 CLINICAL DATA:  Multilevel severe spinal stenosis. EXAM: LUMBAR SPINE - 1 VIEW COMPARISON:  Radiographs dated 08/13/2019 and lumbar MRI dated 07/25/2019 FINDINGS: Lateral image #1 of the lumbar spine demonstrates an instrument at the L5-S1 level.  IMPRESSION: Instrument at L5-S1. Electronically Signed   By: Lorriane Shire M.D.   On: 09/16/2019 12:14    Antibiotics:  Anti-infectives (From admission, onward)   Start     Dose/Rate Route Frequency Ordered Stop   09/20/19 1445  ceFAZolin (ANCEF) IVPB 2g/100 mL premix     2 g 200 mL/hr over 30 Minutes Intravenous Every 8 hours 09/20/19 1439 09/21/19 0015   09/20/19 0644  bacitracin 50,000 Units in sodium chloride 0.9 % 500 mL irrigation  Status:  Discontinued       As needed 09/20/19 0645 09/20/19 0737  Discharge Exam: Blood pressure 104/63, pulse (!) 58, temperature 98.3 F (36.8 C), temperature source Oral, resp. rate 20, height 5' 5"  (1.651 m), weight 87.5 kg, SpO2 96 %. Neurologic: Grossly normal Ambulating with assistance, voiding well   Discharge Medications:   Allergies as of 09/24/2019      Reactions   Prednisone Other (See Comments)   Elevated blood sugar   Sulfa Antibiotics Other (See Comments)   "make my feet burn"   Azithromycin Nausea And Vomiting   Hydrocodone-acetaminophen Itching   Metformin And Related Diarrhea      Medication List    TAKE these medications   allopurinol 100 MG tablet Commonly known as: ZYLOPRIM Take 3 tablets (300 mg total) by mouth daily. What changed:   how much to take  when to take this  additional instructions   amLODipine 5 MG tablet Commonly known as: NORVASC Take 1 tablet (5 mg total) by mouth daily.   atorvastatin 40 MG tablet Commonly known as: LIPITOR Take 1 tablet (40 mg total) by mouth daily. What changed: when to take this   [name] Take 500 mg by mouth 2 (two) times daily.   clonazePAM 0.5 MG tablet Commonly known as: KLONOPIN Take 1 tablet (0.5 mg total) by mouth 2 (two) times daily.   colchicine 0.6 MG tablet TAKE 1 TABLET BY MOUTH DAILY. TAKE 2 TIMES A DAY IF EXPERIENCING A FLARE What changed:   how much to take  how to take this  when to take this  additional instructions    cyclobenzaprine 5 MG tablet Commonly known as: FLEXERIL Take 1 tablet (5 mg total) by mouth 3 (three) times daily as needed for muscle spasms.   [name] 14 Day Sensor Misc Apply 1 kit topically every 14 (fourteen) days.   furosemide 80 MG tablet Commonly known as: LASIX Take 1 tablet (80 mg total) by mouth 2 (two) times daily.   glucose blood test strip Commonly known as: [name] TEST STRIPS Check blood sugars 2-3 times per day. DX: E11.9   glucose monitoring kit monitoring kit 1 each by Does not apply route as needed for other. DX Code: E11.9   HumuLIN R U-500 KwikPen 500 UNIT/ML kwikpen Generic drug: insulin regular human CONCENTRATED Inject 60-80 Units as directed See admin instructions. INJECT 80 UNITS BEFORE BREAKFAST 60 UNITS BEFORE LUNCH AND 60 UNITS BEFORE EVENING MEAL. 30 MINUTES BEFORE MEALS   insulin lispro 100 UNIT/ML injection Commonly known as: HUMALOG Inject 50-80 Units into the skin See admin instructions. Inject 80 units subcutaneously 30 minutes before breakfast and 50 units before lunch and supper   methocarbamol 500 MG tablet Commonly known as: Robaxin Take 1 tablet (500 mg total) by mouth 4 (four) times daily. What changed:   when to take this  reasons to take this   metoprolol tartrate 100 MG tablet Commonly known as: LOPRESSOR Take 1 tablet (100 mg total) by mouth 2 (two) times daily.   oxyCODONE-acetaminophen 10-325 MG tablet Commonly known as: Percocet Take 1 tablet by mouth every 4 (four) hours as needed for pain. What changed: Another medication with the same name was changed. Make sure you understand how and when to take each.   oxyCODONE-acetaminophen 10-325 MG tablet Commonly known as: Percocet Take 1 tablet by mouth every 6 (six) hours as needed for pain. What changed: when to take this   traZODone 100 MG tablet Commonly known as: DESYREL Take 2 tablets (200 mg total) by mouth at bedtime as needed  for sleep. What changed:  when to take this            Durable Medical Equipment  (From admission, onward)         Start     Ordered   09/20/19 1440  DME Walker rolling  Once    Question:  Patient needs a walker to treat with the following condition  Answer:  Gait instability   09/20/19 1439          Disposition: rehab   Final Dx: reexploration of lumbar wound for evacuation of epidural hematoma  Discharge Instructions    Call MD for:   Complete by: As directed    Call MD for:  difficulty breathing, headache or visual disturbances   Complete by: As directed    Call MD for:  hives   Complete by: As directed    Call MD for:  persistant dizziness or light-headedness   Complete by: As directed    Call MD for:  persistant nausea and vomiting   Complete by: As directed    Call MD for:  redness, tenderness, or signs of infection (pain, swelling, redness, odor or green/yellow discharge around incision site)   Complete by: As directed    Call MD for:  severe uncontrolled pain   Complete by: As directed    Call MD for:  temperature >100.4   Complete by: As directed    Diet - low sodium heart healthy   Complete by: As directed    Increase activity slowly   Complete by: As directed    Remove dressing in 48 hours   Complete by: As directed          Signed: Ocie Cornfield Malon Siddall 09/24/2019, 2:43 PM

## 2019-09-24 NOTE — H&P (Signed)
Physical Medicine and Rehabilitation Admission H&P         Chief Complaint  Patient presents with  . Back Pain      S/P Laminectomy  : HPI: Jerry York is a 68 year old right-handed male history of hypertension, rheumatoid arthritis, hyperlipidemia, type 2 diabetes mellitus, [name]  Per chart review lives with spouse.  1 level home 3 steps to entry.  Independent prior to most recent surgery 09/16/2019.  Presented 09/20/2019 after recent decompressive lumbar laminectomy medial facetectomy and foraminotomies L2-3, 3-4, 4-5 and L5-S1 per Dr. Sherley Bounds.  Discharge to home ambulating 250 feet minimal guard 09/18/2019.  Presented 09/20/2019 with increasing urinary retention and decreased mobility.  Follow-up imaging showed postoperative lumbar epidural hematoma.  Underwent lumbar reexploration with removal of epidural hematoma L2-S1 09/20/2019 per Dr. Ronnald Ramp.  Hospital course pain management.  No brace required.  Therapy evaluation completed and patient was admitted for a comprehensive rehab program.   Review of Systems  Constitutional: Negative for chills and fever.  HENT: Negative for hearing loss.   Eyes: Negative for blurred vision and double vision.  Respiratory: Negative for cough and shortness of breath.   Cardiovascular: Negative for chest pain and leg swelling.  Gastrointestinal: Positive for constipation. Negative for heartburn, nausea and vomiting.  Genitourinary: Negative for dysuria and flank pain.       Urinary retention  Musculoskeletal: Positive for back pain and myalgias.  Skin: Negative for rash.  Psychiatric/Behavioral:       Anxiety  All other systems reviewed and are negative.       Past Medical History:  Diagnosis Date  . Anxiety    . Bilateral renal cysts    . Gout      02-22-2017 acute right foot gout---  per pt resolved  . Gout    . Gross hematuria    . Heart murmur    . History of BPH 08/14/2017  . Hydronephrosis, left     . Hyperlipidemia    . Hypertension    . Renal insufficiency    . Rheumatoid arthritis ([place])    . Swelling    . Type 2 diabetes mellitus ([place])    . Urinary retention    . Wears glasses          Past Surgical History:  Procedure Laterality Date  . APPENDECTOMY   age 78  . CYSTOSCOPY WITH URETEROSCOPY AND STENT PLACEMENT Left 05/08/2017    Procedure: URETEROSCOPY AND STENT PLACEMENT;  Surgeon: Kathie Rhodes, MD;  Location: [facility];  Service: Urology;  Laterality: Left;  . CYSTOSCOPY/RETROGRADE/URETEROSCOPY Bilateral 05/08/2017    Procedure: CYSTOSCOPY/ BILATERAL RETROGRADE;  Surgeon: Kathie Rhodes, MD;  Location: [facility];  Service: Urology;  Laterality: Bilateral;  . HEMATOMA EVACUATION N/A 09/20/2019    Procedure: EVACUATION LUMBAR EPIDURAL HEMATOMA;  Surgeon: Newman Pies, MD;  Location: [place];  Service: Neurosurgery;  Laterality: N/A;  EVACUATION LUMBAR EPIDURAL HEMATOMA  . INGUINAL HERNIA REPAIR Right 10/10/2000  . KNEE ARTHROSCOPY Right 1980's  . LUMBAR LAMINECTOMY/DECOMPRESSION MICRODISCECTOMY N/A 09/16/2019    Procedure: Laminectomy and Foraminotomy - Lumbar Two-Lumbar Three - Lumbar Three-Lumbar Four - Lumbar Four-Lumbar Five - Lumbar Five-Sacral One;  Surgeon: Eustace Moore, MD;  Location: [facility] OR;  Service: Neurosurgery;  Laterality: N/A;  Laminectomy and Foraminotomy - Lumbar Two-Lumbar Three - Lumbar Three-Lumbar Four - Lumbar Four-Lumbar Five - Lumbar Five-Sacral One  . NASAL FRACTURE SURGERY  in highschool   . SHOULDER ARTHROSCOPY WITH OPEN ROTATOR CUFF REPAIR Right 1990's  . TRANSURETHRAL RESECTION OF PROSTATE            Family History  Problem Relation Age of Onset  . Congestive Heart Failure Mother          Related to valve disease.  Opted to treat medically  . Hypertension Mother    . Hyperlipidemia Mother    . Diabetes Mother    . Hypertension Father    . Hyperlipidemia Father    . Coronary artery disease Father 26         History of CABG  . Stroke Father    . Hypertension Brother    . Diabetes Brother    . Hypertension Daughter    . Hypertension Son    . Hypertension Brother    . Hypertension Brother     Social History:  reports that he has never smoked. He has never used smokeless tobacco. He reports that he does not drink alcohol or use drugs. Allergies:       Allergies  Allergen Reactions  . Prednisone Other (See Comments)      Elevated blood sugar  . Sulfa Antibiotics Other (See Comments)      "make my feet burn"  . Azithromycin Nausea And Vomiting  . Hydrocodone-Acetaminophen Itching  . Metformin And Related Diarrhea         Medications Prior to Admission  Medication Sig Dispense Refill  . allopurinol (ZYLOPRIM) 100 MG tablet Take 3 tablets (300 mg total) by mouth daily. (Patient taking differently: Take 100-200 mg by mouth See admin instructions. Take one tablet (100 mg) by mouth every morning and two tablets (200 mg) at night) 90 tablet 2  . amLODipine (NORVASC) 5 MG tablet Take 1 tablet (5 mg total) by mouth daily. 90 tablet 1  . atorvastatin (LIPITOR) 40 MG tablet Take 1 tablet (40 mg total) by mouth daily. (Patient taking differently: Take 40 mg by mouth at bedtime. ) 90 tablet 3  . [name] Take 500 mg by mouth 2 (two) times daily.       . clonazePAM (KLONOPIN) 0.5 MG tablet Take 1 tablet (0.5 mg total) by mouth 2 (two) times daily. 180 tablet 0  . colchicine 0.6 MG tablet TAKE 1 TABLET BY MOUTH DAILY. TAKE 2 TIMES A DAY IF EXPERIENCING A FLARE (Patient taking differently: Take 0.6 mg by mouth See admin instructions. Take one tablet (0.6 mg) by mouth every morning, may take a 2nd dose at night if experiencing a gout flare) 90 tablet 0  . cyclobenzaprine (FLEXERIL) 5 MG tablet Take 1 tablet (5 mg total) by mouth 3 (three) times daily as needed for muscle spasms. 30 tablet 0  . furosemide (LASIX) 80 MG tablet Take 1 tablet (80 mg total) by mouth 2 (two) times daily. 180 tablet 3  .  insulin lispro (HUMALOG) 100 UNIT/ML injection Inject 50-80 Units into the skin See admin instructions. Inject 80 units subcutaneously 30 minutes before breakfast and 50 units before lunch and supper      . metoprolol tartrate (LOPRESSOR) 100 MG tablet Take 1 tablet (100 mg total) by mouth 2 (two) times daily. 180 tablet 3  . oxyCODONE-acetaminophen (PERCOCET) 10-325 MG tablet Take 1 tablet by mouth every 4 (four) hours as needed for pain. 20 tablet 0  . traZODone (DESYREL) 100 MG tablet Take 2 tablets (200 mg total) by mouth at bedtime as needed for sleep. (Patient  taking differently: Take 200 mg by mouth at bedtime. ) 180 tablet 3  . Continuous Blood Gluc Sensor ([name] 14 DAY SENSOR) MISC Apply 1 kit topically every 14 (fourteen) days. 6 each 3  . glucose blood ([name] TEST STRIPS) test strip Check blood sugars 2-3 times per day. DX: E11.9 300 each 2  . glucose monitoring kit ([name]) monitoring kit 1 each by Does not apply route as needed for other. DX Code: E11.9 1 each 0  . HUMULIN R U-500 KWIKPEN 500 UNIT/ML kwikpen Inject 60-80 Units as directed See admin instructions. INJECT 80 UNITS BEFORE BREAKFAST 60 UNITS BEFORE LUNCH AND 60 UNITS BEFORE EVENING MEAL. [place] methocarbamol (ROBAXIN) 500 MG tablet Take 1 tablet (500 mg total) by mouth every 6 (six) hours as needed for muscle spasms. (Patient not taking: Reported on 09/20/2019) 60 tablet 1  . oxyCODONE-acetaminophen (PERCOCET) 10-325 MG tablet Take 1 tablet by mouth every 4 (four) hours as needed for pain. (Patient not taking: Reported on 09/20/2019) 40 tablet 0     Drug Regimen Review Drug regimen was reviewed and remains appropriate with no significant issues identified   Home: Home Living Family/patient expects to be discharged to:: Private residence Living Arrangements: Spouse/significant other Available Help at Discharge: Family Type of Home: House Home Access: Ramped entrance Entrance  Stairs-Number of Steps: [place]: One level Bathroom Shower/Tub: Gaffer, Charity fundraiser: [place]: Hand held shower head, Grab bars - tub/shower, Shower seat - built in Additional Comments: Pt reports that wife can help him with shoes if needed, but she does not like to be bothered   Functional History: Prior Function Level of Independence: Independent Gait / Transfers Assistance Needed: unable to ambulate since discharge Comments: independent prior to surgery on 9/21   Functional Status:  Mobility: Bed Mobility Overal bed mobility: Needs Assistance Bed Mobility: Rolling, Sidelying to Sit Rolling: Supervision Sidelying to sit: Min guard Sit to sidelying: Min assist General bed mobility comments: Increased time and use of rails required to transition to EOB.  Transfers Overall transfer level: Needs assistance Equipment used: Rolling walker (2 wheeled) Transfers: Sit to/from Stand Sit to Stand: Min guard Stand pivot transfers: Min assist General transfer comment: Hands-on guarding required for power-up to full stand. VC's for hand placement on seated surface for safety, however pt reluctant to comply.  Ambulation/Gait Ambulation/Gait assistance: Min guard, +2 safety/equipment(chair follow) Gait Distance (Feet): 100 Feet(25', seated rest, 75') Assistive device: Rolling walker (2 wheeled) Gait Pattern/deviations: Step-through pattern, Decreased stride length, Trunk flexed, Narrow base of support General Gait Details: pt not safe to ambulate with current incr weakness; RN and MD notified of this change Gait velocity: Decreased Gait velocity interpretation: <1.31 ft/sec, indicative of household ambulator     ADL: ADL Overall ADL's : Needs assistance/impaired Eating/Feeding: Independent Grooming: Oral care, Set up, Sitting Grooming Details (indicate cue type and reason): Pt performed oral care sitting EOB with set up Upper Body Bathing:  Supervision/ safety, Sitting Lower Body Bathing: Moderate assistance Upper Body Dressing : Supervision/safety, Sitting Lower Body Dressing: Sit to/from stand, With adaptive equipment, Moderate assistance Lower Body Dressing Details (indicate cue type and reason): Provided education on use of AE for LB ADLs. Pt demonstrated understanding and partially donned pants with supervision, require mod A to stand and adjust LB clothing Toilet Transfer: Min guard, Minimal assistance, Ambulation, RW(simulated to recliner) Toilet Transfer Details (indicate cue type and reason): Pt required min  A for power up to stand, min guard A for safety and balance Toileting- Clothing Manipulation and Hygiene: Minimal assistance, Sit to/from stand Functional mobility during ADLs: Min guard, Rolling walker General ADL Comments: Pt performed oral care and functional mobility with set up- min A for safety   Cognition: Cognition Overall Cognitive Status: No family/caregiver present to determine baseline cognitive functioning Orientation Level: Oriented X4 Cognition Arousal/Alertness: Awake/alert Behavior During Therapy: WFL for tasks assessed/performed Overall Cognitive Status: No family/caregiver present to determine baseline cognitive functioning General Comments: Pt with many questions regarding who was in his room earlier from therapy. No other notes in chart from this morning - unsure who pt is referring to.    Physical Exam: Blood pressure (!) 130/59, pulse (!) 52, temperature 98.4 F (36.9 C), temperature source Oral, resp. rate 18, height 5' 5"  (1.651 m), weight 87.5 kg, SpO2 94 %. Physical Exam  Constitutional: He is oriented to person, place, and time. He appears well-developed and well-nourished. No distress.  HENT:  Head: Normocephalic and atraumatic.  Mouth/Throat: Oropharynx is clear and moist. No oropharyngeal exudate.  Eyes: Pupils are equal, round, and reactive to light. EOM are normal. Right eye  exhibits no discharge. Left eye exhibits no discharge. No scleral icterus.  Neck: Normal range of motion. No JVD present. No tracheal deviation present. No thyromegaly present.  Cardiovascular: Normal rate and regular rhythm. Exam reveals no friction rub.  No murmur heard. Respiratory: Effort normal. No respiratory distress. He has no wheezes. He has no rales.  GI: Soft. He exhibits no distension. There is no abdominal tenderness.  Genitourinary:    Genitourinary Comments: Foley catheter in place, clear yellow urine   Musculoskeletal: Normal range of motion.        General: No edema.     Comments: Low back TTP  Neurological: He is alert and oriented to person, place, and time. No cranial nerve deficit.  Normal insight and awareness. Motor 5/5 UE and 4/5 LE prox to distal. No sensory findings.   Skin: He is not diaphoretic.  Back incision clean and dry with Steri-Strips in place, wound well-approximated  Psychiatric: He has a normal mood and affect. His behavior is normal.      Lab Results Last 48 Hours       Results for orders placed or performed during the hospital encounter of 09/19/19 (from the past 48 hour(s))  Glucose, capillary     Status: Abnormal    Collection Time: 09/22/19  6:10 AM  Result Value Ref Range    Glucose-Capillary 314 (H) 70 - 99 mg/dL  Glucose, capillary     Status: Abnormal    Collection Time: 09/22/19 12:11 PM  Result Value Ref Range    Glucose-Capillary 302 (H) 70 - 99 mg/dL  Glucose, capillary     Status: Abnormal    Collection Time: 09/22/19  4:31 PM  Result Value Ref Range    Glucose-Capillary 340 (H) 70 - 99 mg/dL  Glucose, capillary     Status: Abnormal    Collection Time: 09/22/19  9:26 PM  Result Value Ref Range    Glucose-Capillary 275 (H) 70 - 99 mg/dL  Glucose, capillary     Status: Abnormal    Collection Time: 09/23/19  6:01 AM  Result Value Ref Range    Glucose-Capillary 352 (H) 70 - 99 mg/dL  Glucose, capillary     Status: Abnormal     Collection Time: 09/23/19 11:23 AM  Result Value Ref Range  Glucose-Capillary 330 (H) 70 - 99 mg/dL    Comment 1 Notify RN      Comment 2 Document in Chart    Glucose, capillary     Status: Abnormal    Collection Time: 09/23/19  4:25 PM  Result Value Ref Range    Glucose-Capillary 317 (H) 70 - 99 mg/dL  Glucose, capillary     Status: Abnormal    Collection Time: 09/23/19  9:28 PM  Result Value Ref Range    Glucose-Capillary 374 (H) 70 - 99 mg/dL    Comment 1 Notify RN      Comment 2 Document in Chart       Imaging Results (Last 48 hours)  No results found.           Medical Problem List and Plan: 1.  Decreased functional mobility secondary to lumbar epidural hematoma status post removal of epidural hematoma L2 S1 09/20/2019 after decompressive lumbar laminectomy and foraminotomies L2-3, 3-4, L4-5, L5-S1 09/16/2019.  No back brace required             -admit to inpatient rehab  -ELOS 7 days, Mod I goals 2.  Antithrombotics: -DVT/anticoagulation: SCD.     -dopplers not need, pt ambulatory             -antiplatelet therapy: N/A 3. Pain Management: Oxycodone and Flexeril as needed 4. Mood: Klonopin 0.5 mg twice daily, trazodone 200 mg nightly as needed             -antipsychotic agents: N/A 5. Neuropsych: This patient is capable of making decisions on his own behalf. 6. Skin/Wound Care: Routine skin checks 7. Fluids/Electrolytes/Nutrition: Routine in and outs with follow-up chemistries 8.  Hypertension.  Norvasc 5 mg daily, Lasix 80 mg twice daily, Lopressor 100 mg twice daily             -monitor bp's with activity 9.  Urinary retention.  Remove foley at discharge             -begin voiding trial, check pvr's             -I/O caths prn. Was apparently I/O cathing at home 10.  Rheumatoid arthritis.  Mobic 7.5 mg twice daily 11.  CKD stage II.  Creatinine baseline 1.29-1.72.  Continue Lasix as prior to admission 12.  History of gout.  Colchicine 0.6 mg daily and Zyloprim 300  mg daily.               -no flare ups at present 13.  Diabetes mellitus.  Hemoglobin A1c 8.1.  SSI. With cbg's tid/ac&hs             -monitor for trends 14.  Constipation.  Laxative assistance       Cathlyn Parsons, PA-C 09/24/2019   I have personally performed a face to face diagnostic evaluation of this patient and formulated the key components of the plan.  Additionally, I have personally reviewed laboratory data, imaging studies, as well as relevant notes and concur with the physician assistant's documentation above.  The patient's status has not changed from the original H&P.  Any changes in documentation from the acute care chart have been noted above.  Meredith Staggers, MD, [facility]

## 2019-09-24 NOTE — Progress Notes (Signed)
Patient ID: Jerry York, male   DOB: 08-15-1951, 68 y.o.   MRN: [id number] Pt c/o of back pain "when I move." this is expected. Denies leg pain, states legs are "alright." wound CDI. Therapy notes read. CIR evaluating. Order written for foley removal yesterday, never happened. Order rewritten. Moves legs well in bed.

## 2019-09-24 NOTE — Progress Notes (Signed)
Pt admitted to 31m02. Pt alert and oriented. Pt has call bell within reach and all belongings are at bedside. Continue plan of care.

## 2019-09-24 NOTE — PMR Pre-admission (Signed)
PMR Admission Coordinator Pre-Admission Assessment  Patient: Jerry York is an 68 y.o., male MRN: [id number] DOB: [birth year] Height: 5' 5"  (165.1 cm) Weight: 87.5 kg  Insurance Information HMO: PPO: yes     PCP:      IPA:      80/20:      OTHER: medicare advantage plan PRIMARY: [employer]      Policy#: [id number]      Subscriber: pt CM Name: Liam Rogers      Phone#: [phone]     Fax#: Epic access Pre-Cert#: [id number] approved for 7 days     Employer:  Benefits:  Phone #: [phone]     Name: 9/29 Eff. Date: 12/26/2018     Deduct: none      Out of Pocket Max: $3400      Life Max: none CIR: $295 co pay per day days 1 until 6      SNF: $20 co pay per day days 1 until 20; $160 co pay per day days 21 until 100 Outpatient: $15 co pay per visit     Co-Pay: visits per medical neccesity Home Health: 100%      Co-Pay: visits per medical neccesity DME: 80%     Co-Pay: 20% Providers: in network  SECONDARY: none       Medicaid Application Date:       Case Manager:  Disability Application Date:       Case Worker:   The "Data Collection Information Summary" for patients in Inpatient Rehabilitation Facilities with attached "Privacy Act [place] Records" was provided and verbally reviewed with: Patient  Emergency Contact Information Contact Information    Name Relation Home Work Mobile   [place] Spouse [phone]  [phone]   Creason,Christina Daughter [phone]        Current Medical History  Patient Admitting Diagnosis: epidural hematoma s/p laminectomy  History of Present Illness:  [name] is a 68 year old right-handed male history of hypertension, rheumatoid arthritis, hyperlipidemia, type 2 diabetes mellitus, [name]  Per chart review lives with spouse.  1 level home 3 steps to entry.  Independent prior to most recent surgery 09/16/2019.  Presented 09/20/2019 after recent decompressive lumbar laminectomy medial  facetectomy and foraminotomies L2-3, 3-4, 4-5 and L5-S1 per Dr. Sherley Bounds.  Discharge to home ambulating 250 feet minimal guard 09/18/2019.  Presented 09/20/2019 with increasing urinary retention and decreased mobility.  Follow-up imaging showed postoperative lumbar epidural hematoma.  Underwent lumbar reexploration with removal of epidural hematoma L2-S1 09/20/2019 per Dr. Ronnald Ramp.  Hospital course pain management.  No brace required.   Patient's medical record from [facility]  has been reviewed by the rehabilitation admission coordinator and physician.  Past Medical History  Past Medical History:  Diagnosis Date  . Anxiety   . Bilateral renal cysts   . Gout    02-22-2017 acute right foot gout---  per pt resolved  . Gout   . Gross hematuria   . Heart murmur   . History of BPH 08/14/2017  . Hydronephrosis, left   . Hyperlipidemia   . Hypertension   . Renal insufficiency   . Rheumatoid arthritis ([place])   . Swelling   . Type 2 diabetes mellitus ([place])   . Urinary retention   . Wears glasses     Family History   family history includes Congestive Heart Failure in his mother; Coronary artery disease (age of onset: 72) in his father; Diabetes in his brother and mother; Hyperlipidemia  in his father and mother; Hypertension in his brother, brother, brother, daughter, father, mother, and son; Stroke in his father.  Prior Rehab/Hospitalizations Has the patient had prior rehab or hospitalizations prior to admission? Yes [facility] 10/2018 for 6 days  Has the patient had major surgery during 100 days prior to admission? Yes   Current Medications  Current Facility-Administered Medications:  .  0.9 %  sodium chloride infusion, 250 mL, Intravenous, Continuous, Eustace Moore, MD .  0.9 % NaCl with KCl 20 mEq/ L  infusion, , Intravenous, Continuous, Eustace Moore, MD, Last Rate: 10 mL/hr at 09/23/19 1008 .  acetaminophen (TYLENOL) tablet 650 mg, 650 mg, Oral, Q4H PRN **OR**  acetaminophen (TYLENOL) suppository 650 mg, 650 mg, Rectal, Q4H PRN, Eustace Moore, MD .  allopurinol (ZYLOPRIM) tablet 100 mg, 100 mg, Oral, TID, Eustace Moore, MD, 100 mg at 09/24/19 1048 .  amLODipine (NORVASC) tablet 5 mg, 5 mg, Oral, Daily, Eustace Moore, MD, 5 mg at 09/24/19 1047 .  bisacodyl (DULCOLAX) suppository 10 mg, 10 mg, Rectal, Daily PRN, Eustace Moore, MD .  Chlorhexidine Gluconate Cloth 2 % PADS 6 each, 6 each, Topical, Daily, Arrien, Jimmy Picket, MD, 6 each at 09/24/19 1048 .  clonazePAM (KLONOPIN) tablet 0.5 mg, 0.5 mg, Oral, BID, Eustace Moore, MD, 0.5 mg at 09/24/19 1047 .  colchicine tablet 0.6 mg, 0.6 mg, Oral, Daily, Eustace Moore, MD, 0.6 mg at 09/24/19 1048 .  cyclobenzaprine (FLEXERIL) tablet 5 mg, 5 mg, Oral, TID PRN, Eustace Moore, MD, 5 mg at 09/23/19 0959 .  furosemide (LASIX) tablet 80 mg, 80 mg, Oral, BID, Eustace Moore, MD, 80 mg at 09/24/19 0847 .  [START ON 09/25/2019] influenza vaccine adjuvanted (FLUAD) injection 0.5 mL, 0.5 mL, Intramuscular, Tomorrow-1000, Eustace Moore, MD .  insulin aspart (novoLOG) injection 0-15 Units, 0-15 Units, Subcutaneous, TID WC, Eustace Moore, MD, 8 Units at 09/24/19 1201 .  magnesium citrate solution 1 Bottle, 1 Bottle, Oral, Once, Eustace Moore, MD .  meloxicam [facility]) tablet 7.5 mg, 7.5 mg, Oral, BID, Eustace Moore, MD, 7.5 mg at 09/24/19 1047 .  menthol-cetylpyridinium (CEPACOL) lozenge 3 mg, 1 lozenge, Oral, PRN **OR** phenol (CHLORASEPTIC) mouth spray 1 spray, 1 spray, Mouth/Throat, PRN, Eustace Moore, MD .  metoprolol tartrate (LOPRESSOR) tablet 100 mg, 100 mg, Oral, BID, Eustace Moore, MD, 100 mg at 09/24/19 1047 .  morphine 2 MG/ML injection 2 mg, 2 mg, Intravenous, Q2H PRN, Eustace Moore, MD, 2 mg at 09/24/19 1045 .  ondansetron (ZOFRAN) tablet 4 mg, 4 mg, Oral, Q6H PRN **OR** ondansetron (ZOFRAN) injection 4 mg, 4 mg, Intravenous, Q6H PRN, Eustace Moore, MD .  oxyCODONE-acetaminophen (PERCOCET/ROXICET)  5-325 MG per tablet 1-2 tablet, 1-2 tablet, Oral, Q4H PRN, 2 tablet at 09/22/19 1811 **AND** oxyCODONE (Oxy IR/ROXICODONE) immediate release tablet 5 mg, 5 mg, Oral, Q4H PRN, Eustace Moore, MD, 5 mg at 09/23/19 0959 .  polyethylene glycol (MIRALAX / GLYCOLAX) packet 17 g, 17 g, Oral, Daily, Costella, Vincent J, PA-C, 17 g at 09/22/19 1811 .  senna (SENOKOT) tablet 8.6 mg, 1 tablet, Oral, BID, Eustace Moore, MD, 8.6 mg at 09/24/19 1047 .  sodium chloride flush (NS) 0.9 % injection 3 mL, 3 mL, Intravenous, Q12H, Eustace Moore, MD, 3 mL at 09/24/19 1049 .  sodium chloride flush (NS) 0.9 % injection 3 mL, 3 mL, Intravenous, PRN, Eustace Moore, MD .  traZODone (  DESYREL) tablet 200 mg, 200 mg, Oral, QHS PRN, Eustace Moore, MD, 200 mg at 09/23/19 2216  Patients Current Diet:  Diet Order            Diet heart healthy/carb modified Room service appropriate? Yes; Fluid consistency: Thin  Diet effective now              Precautions / Restrictions Precautions Precautions: Fall, Back Precaution Booklet Issued: No Precaution Comments: Reviewed precautions during functional mobility Restrictions Weight Bearing Restrictions: No   Has the patient had 2 or more falls or a fall with injury in the past year? No  Prior Activity Level Limited Community (1-2x/wk): Mod I with cane; driving  Prior Functional Level Self Care: Did the patient need help bathing, dressing, using the toilet or eating? Independent  Indoor Mobility: Did the patient need assistance with walking from room to room (with or without device)? Independent  Stairs: Did the patient need assistance with internal or external stairs (with or without device)? Independent  Functional Cognition: Did the patient need help planning regular tasks such as shopping or remembering to take medications? Independent  Home Assistive Devices / Equipment Home Assistive Devices/Equipment: Eyeglasses, CBG Meter, Blood pressure cuff, Walker (specify  type), Cane (specify quad or straight), Grab bars in shower, Built-in shower seat Home Equipment: Hand held shower head, Grab bars - tub/shower, Shower seat - built in  Prior Device Use: Indicate devices/aids used by the patient prior to current illness, exacerbation or injury? cane  Current Functional Level Cognition  Overall Cognitive Status: No family/caregiver present to determine baseline cognitive functioning Orientation Level: Oriented X4 General Comments: Pt with many questions regarding who was in his room earlier from therapy. No other notes in chart from this morning - unsure who pt is referring to.     Extremity Assessment (includes Sensation/Coordination)  Upper Extremity Assessment: Overall WFL for tasks assessed  Lower Extremity Assessment: Defer to PT evaluation RLE Deficits / Details: ankle AROM WFL, strength hip flexion 3-/5, knee extension 4-/5 LLE Deficits / Details: ankle AROM WFL, strength hip flexion 3-/5, knee extension 4-/5    ADLs  Overall ADL's : Needs assistance/impaired Eating/Feeding: Independent Grooming: Oral care, Set up, Sitting Grooming Details (indicate cue type and reason): Pt performed oral care sitting EOB with set up Upper Body Bathing: Supervision/ safety, Sitting Lower Body Bathing: Moderate assistance Upper Body Dressing : Supervision/safety, Sitting Lower Body Dressing: Sit to/from stand, With adaptive equipment, Moderate assistance Lower Body Dressing Details (indicate cue type and reason): Provided education on use of AE for LB ADLs. Pt demonstrated understanding and partially donned pants with supervision, require mod A to stand and adjust LB clothing Toilet Transfer: Min guard, Minimal assistance, Ambulation, RW(simulated to recliner) Toilet Transfer Details (indicate cue type and reason): Pt required min A for power up to stand, min guard A for safety and balance Toileting- Clothing Manipulation and Hygiene: Minimal assistance, Sit  to/from stand Functional mobility during ADLs: Min guard, Rolling walker General ADL Comments: Pt performed oral care and functional mobility with set up- min A for safety    Mobility  Overal bed mobility: Needs Assistance Bed Mobility: Rolling, Sidelying to Sit Rolling: Supervision Sidelying to sit: Min guard Sit to sidelying: Min assist General bed mobility comments: Increased time and use of rails required to transition to EOB.     Transfers  Overall transfer level: Needs assistance Equipment used: Rolling walker (2 wheeled) Transfers: Sit to/from Stand Sit to Stand: Min guard Stand  pivot transfers: Min assist General transfer comment: Hands-on guarding required for power-up to full stand. VC's for hand placement on seated surface for safety, however pt reluctant to comply.     Ambulation / Gait / Stairs / Wheelchair Mobility  Ambulation/Gait Ambulation/Gait assistance: Min guard, +2 safety/equipment(chair follow) Gait Distance (Feet): 100 Feet(25', seated rest, 75') Assistive device: Rolling walker (2 wheeled) Gait Pattern/deviations: Step-through pattern, Decreased stride length, Trunk flexed, Narrow base of support General Gait Details: pt not safe to ambulate with current incr weakness; RN and MD notified of this change Gait velocity: Decreased Gait velocity interpretation: <1.31 ft/sec, indicative of household ambulator    Posture / Balance Balance Overall balance assessment: Needs assistance Sitting-balance support: Feet supported Sitting balance-Leahy Scale: Fair Standing balance support: Bilateral upper extremity supported, During functional activity Standing balance-Leahy Scale: Poor Standing balance comment: requires BUE on RW    Special needs/care consideration BiPAP/CPAP n/a CPM  N/a Continuous Drip IV  N/a Dialysis n/a Life Vest  N/a Oxygen  N/a Special Bed  N/a Trach Size  N/a Wound Vac n/a Skin surgical dressing Bowel mgmt:  Continent LBM  9/20 Bladder mgmt: 9/25 non latex #16 french placed Diabetic mgmt: yes Behavioral consideration  N/a Chemo/radiation  N/a Designated visitor is wife Malachy Mood   Previous Home Environment  Living Arrangements: Spouse/significant other  Lives With: Spouse Available Help at Discharge: Family, Available 24 hours/day(wife) Type of Home: House Home Layout: One level Home Access: Ramped entrance Entrance Stairs-Number of Steps: 3 Bathroom Shower/Tub: Gaffer, Charity fundraiser: Standard Bathroom Accessibility: Yes How Accessible: Accessible via walker Home Care Services: No Additional Comments: Pt reports that wife can help him with shoes if needed, but she does not like to be bothered  Discharge Living Setting Plans for Discharge Living Setting: Patient's home, Lives with (comment)(wife) Type of Home at Discharge: House Discharge Home Layout: One level Discharge Home Access: Ramped entrance Discharge Bathroom Shower/Tub: Walk-in shower, Door Discharge Bathroom Toilet: Standard Discharge Bathroom Accessibility: Yes How Accessible: Accessible via walker Does the patient have any problems obtaining your medications?: No  Social/Family/Support Systems Patient Roles: Spouse Contact Information: wife, Malachy Mood Anticipated Caregiver: wife Anticipated Ambulance person Information: see above Ability/Limitations of Caregiver: wife has shoulder issues so can not lift him or pull on him Caregiver Availability: 24/7 Discharge Plan Discussed with Primary Caregiver: Yes Is Caregiver In Agreement with Plan?: No Does Caregiver/Family have Issues with Lodging/Transportation while Pt is in Rehab?: No  Goals/Additional Needs Patient/Family Goal for Rehab: Mod I with PT and OT Expected length of stay: ELOS 7 days Pt/Family Agrees to Admission and willing to participate: Yes Program Orientation Provided & Reviewed with Pt/Caregiver Including Roles  & Responsibilities: Yes  Decrease  burden of Care through IP rehab admission: n/a  Possible need for SNF placement upon discharge: not anticipated  Patient Condition: I have reviewed medical records from [facility] , spoken with CM, and patient. I met with patient at the bedside for inpatient rehabilitation assessment.  Patient will benefit from ongoing PT and OT, can actively participate in 3 hours of therapy a day 5 days of the week, and can make measurable gains during the admission.  Patient will also benefit from the coordinated team approach during an Inpatient Acute Rehabilitation admission.  The patient will receive intensive therapy as well as Rehabilitation physician, nursing, social worker, and care management interventions.  Due to bladder management, bowel management, safety, skin/wound care, disease management, medication administration, pain management and patient education the  patient requires 24 hour a day rehabilitation nursing.  The patient is currently min assist with mobility and basic ADLs.  Discharge setting and therapy post discharge at home with home health is anticipated.  Patient has agreed to participate in the Acute Inpatient Rehabilitation Program and will admit today.  Preadmission Screen Completed By:  Cleatrice Burke, 09/24/2019 1:04 PM ______________________________________________________________________   Discussed status with Dr. Naaman Plummer  on  09/24/2019 at  1317 and received approval for admission today.  Admission Coordinator:  Cleatrice Burke, RN, time  9643 Date  09/24/2019   Assessment/Plan: Diagnosis: lumbar epidural hematoma 1. Does the need for close, 24 hr/day Medical supervision in concert with the patient's rehab needs make it unreasonable for this patient to be served in a less intensive setting? Yes 2. Co-Morbidities requiring supervision/potential complications: RA, HTN, DM, CKD 3. Due to bladder management, bowel management, safety, skin/wound care, disease  management, medication administration, pain management and patient education, does the patient require 24 hr/day rehab nursing? Yes 4. Does the patient require coordinated care of a physician, rehab nurse, PT (1-2 hrs/day, 5 days/week) and OT (1-2 hrs/day, 5 days/week) to address physical and functional deficits in the context of the above medical diagnosis(es)? Yes Addressing deficits in the following areas: balance, endurance, locomotion, strength, transferring, bowel/bladder control, bathing, dressing, feeding, grooming, toileting and psychosocial support 5. Can the patient actively participate in an intensive therapy program of at least 3 hrs of therapy 5 days a week? Yes 6. The potential for patient to make measurable gains while on inpatient rehab is excellent 7. Anticipated functional outcomes upon discharge from inpatients are: modified independent PT, modified independent OT, n/a SLP 8. Estimated rehab length of stay to reach the above functional goals is: 7 days 9. Anticipated D/C setting: Home 10. Anticipated post D/C treatments: [place] therapy 11. Overall Rehab/Functional Prognosis: excellent  MD Signature: Meredith Staggers, MD, [facility] 09/24/2019

## 2019-09-24 NOTE — Progress Notes (Signed)
Physical Therapy Treatment Patient Details Name: Jerry York MRN: [id number] DOB: 1951-01-13 Today's Date: 09/24/2019    History of Present Illness Pt is a 68 y/o male who is s/p L2-S1 laminectomy/decompression on 09/16/2019. Developed increased pain and LE weakness post surgery and found to have epidural hematoma.  Now s/p evacuation of hematoma on 09/20/19.  PMH significant for DMII, RA, renal insufficiency, HTN, heart murmur, gout, rotator cuff repair (R).    PT Comments    Patient seen for mobility progression. Pt is making progress toward PT goals and tolerated session well. Continue to progress as tolerated.    Follow Up Recommendations  Supervision/Assistance - 24 hour;SNF(pt refusing SNF, however is safest option )     Equipment Recommendations  3in1 (PT)    Recommendations for Other Services       Precautions / Restrictions Precautions Precautions: Fall;Back Precaution Booklet Issued: No Precaution Comments: Reviewed precautions during functional mobility Required Braces or Orthoses: (no brace needed) Restrictions Weight Bearing Restrictions: No    Mobility  Bed Mobility Overal bed mobility: Needs Assistance Bed Mobility: Rolling;Sidelying to Sit Rolling: Supervision Sidelying to sit: Supervision       General bed mobility comments: use of rail and increased time   Transfers Overall transfer level: Needs assistance Equipment used: Rolling walker (2 wheeled) Transfers: Sit to/from Stand Sit to Stand: Min guard         General transfer comment: min guard for safety; no physical assist needed  Ambulation/Gait Ambulation/Gait assistance: Min guard;+2 safety/equipment(chair follow) Gait Distance (Feet): 200 Feet Assistive device: Rolling walker (2 wheeled) Gait Pattern/deviations: Step-through pattern;Decreased stride length;Trunk flexed Gait velocity: Decreased   General Gait Details: cues for upright posture    Stairs              Wheelchair Mobility    Modified Rankin (Stroke Patients Only)       Balance Overall balance assessment: Needs assistance Sitting-balance support: Feet supported Sitting balance-Leahy Scale: Fair     Standing balance support: Bilateral upper extremity supported;During functional activity Standing balance-Leahy Scale: Poor                              Cognition Arousal/Alertness: Awake/alert Behavior During Therapy: WFL for tasks assessed/performed Overall Cognitive Status: Within Functional Limits for tasks assessed                                        Exercises      General Comments        Pertinent Vitals/Pain Pain Assessment: Faces Faces Pain Scale: Hurts little more Pain Location: back and legs Pain Descriptors / Indicators: Discomfort;Aching Pain Intervention(s): Limited activity within patient's tolerance;Monitored during session;Repositioned    Home Living     Available Help at Discharge: Family;Available 24 hours/day(wife)                Prior Function            PT Goals (current goals can now be found in the care plan section) Acute Rehab PT Goals Patient Stated Goal: Decrease pain Progress towards PT goals: Progressing toward goals    Frequency    Min 5X/week      PT Plan Current plan remains appropriate    Co-evaluation              AM-PAC PT "6  Clicks" Mobility   Outcome Measure  Help needed turning from your back to your side while in a flat bed without using bedrails?: A Little Help needed moving from lying on your back to sitting on the side of a flat bed without using bedrails?: A Little Help needed moving to and from a bed to a chair (including a wheelchair)?: A Little Help needed standing up from a chair using your arms (e.g., wheelchair or bedside chair)?: A Little Help needed to walk in hospital room?: A Little Help needed climbing 3-5 steps with a railing? : A Lot 6 Click  Score: 17    End of Session Equipment Utilized During Treatment: Gait belt Activity Tolerance: Patient tolerated treatment well Patient left: with call bell/phone within reach;in chair;with chair alarm set Nurse Communication: Mobility status PT Visit Diagnosis: Muscle weakness (generalized) (M62.81);Difficulty in walking, not elsewhere classified (R26.2);Pain Pain - part of body: (back)     Time: 9735-3299 PT Time Calculation (min) (ACUTE ONLY): 14 min  Charges:  $Gait Training: 8-22 mins                     Earney Navy, PTA Acute Rehabilitation Services Pager: [phone] Office: [phone]     Darliss Cheney 09/24/2019, 1:16 PM

## 2019-09-24 NOTE — Progress Notes (Signed)
Inpatient Diabetes Program Recommendations  AACE/ADA: New Consensus Statement on Inpatient Glycemic Control (2015)  Target Ranges:  Prepandial:   less than 140 mg/dL      Peak postprandial:   less than 180 mg/dL (1-2 hours)      Critically ill patients:  140 - 180 mg/dL   Lab Results  Component Value Date   GLUCAP 267 (H) 09/24/2019   HGBA1C 8.1 08/09/2019    Review of Glycemic Control Results for Jerry York, Jerry York (MRN [id number]) as of 09/24/2019 12:32  Ref. Range 09/23/2019 11:23 09/23/2019 16:25 09/23/2019 21:28 09/24/2019 06:41 09/24/2019 12:00  Glucose-Capillary Latest Ref Range: 70 - 99 mg/dL 330 (H) 317 (H) 374 (H) 346 (H) 267 (H)   Diabetes history: DM2 Outpatient Diabetes medications: Humulin R U 500 80 units ac breakfast & 60 units ac supper Current orders for Inpatient glycemic control: Novolog moderate correction tid  Inpatient Diabetes Program Recommendations:   -Start U 500 insulin 40 units ac breakfast + 30 units ac supper (50% home dose) -Increase Novolog correction to resistant + hs 0-5 units Noted plans to transition to inpatient rehab program probably today.  Thank you, Jerry York. Jerry Dam, RN, MSN, CDE  Diabetes Coordinator Inpatient Glycemic Control Team Team Pager [phone] (8am-5pm) 09/24/2019 12:33 PM

## 2019-09-24 NOTE — Progress Notes (Signed)
Inpatient Rehabilitation Admissions Coordinator  Insurance has approved an inpt rehab admit. I met with patient at bedside and he is in agreement. I contacted Kim, GNP and will arrange admit today.  Barbara Boyette, RN, MSN Rehab Admissions Coordinator [phone] 09/24/2019 12:30 PM  

## 2019-09-25 ENCOUNTER — Inpatient Hospital Stay (HOSPITAL_COMMUNITY): Payer: PPO

## 2019-09-25 ENCOUNTER — Inpatient Hospital Stay (HOSPITAL_COMMUNITY): Payer: PPO | Admitting: Physical Therapy

## 2019-09-25 ENCOUNTER — Inpatient Hospital Stay (HOSPITAL_COMMUNITY): Payer: PPO | Admitting: Occupational Therapy

## 2019-09-25 ENCOUNTER — Ambulatory Visit: Payer: PPO | Admitting: Family Medicine

## 2019-09-25 ENCOUNTER — Ambulatory Visit: Payer: PPO

## 2019-09-25 DIAGNOSIS — N319 Neuromuscular dysfunction of bladder, unspecified: Secondary | ICD-10-CM | POA: Diagnosis present

## 2019-09-25 LAB — CBC WITH DIFFERENTIAL/PLATELET
Abs Immature Granulocytes: 0.12 10*3/uL — ABNORMAL HIGH (ref 0.00–0.07)
Basophils Absolute: 0 10*3/uL (ref 0.0–0.1)
Basophils Relative: 0 %
Eosinophils Absolute: 0 10*3/uL (ref 0.0–0.5)
Eosinophils Relative: 1 %
HCT: 35.5 % — ABNORMAL LOW (ref 39.0–52.0)
Hemoglobin: 12.4 g/dL — ABNORMAL LOW (ref 13.0–17.0)
Immature Granulocytes: 3 %
Lymphocytes Relative: 29 %
Lymphs Abs: 1.1 10*3/uL (ref 0.7–4.0)
MCH: 30.8 pg (ref 26.0–34.0)
MCHC: 34.9 g/dL (ref 30.0–36.0)
MCV: 88.3 fL (ref 80.0–100.0)
Monocytes Absolute: 0.3 10*3/uL (ref 0.1–1.0)
Monocytes Relative: 8 %
Neutro Abs: 2.2 10*3/uL (ref 1.7–7.7)
Neutrophils Relative %: 59 %
Platelets: 154 10*3/uL (ref 150–400)
RBC: 4.02 MIL/uL — ABNORMAL LOW (ref 4.22–5.81)
RDW: 15.7 % — ABNORMAL HIGH (ref 11.5–15.5)
WBC: 3.7 10*3/uL — ABNORMAL LOW (ref 4.0–10.5)
nRBC: 0.8 % — ABNORMAL HIGH (ref 0.0–0.2)

## 2019-09-25 LAB — COMPREHENSIVE METABOLIC PANEL
ALT: 32 U/L (ref 0–44)
AST: 33 U/L (ref 15–41)
Albumin: 2.6 g/dL — ABNORMAL LOW (ref 3.5–5.0)
Alkaline Phosphatase: 65 U/L (ref 38–126)
Anion gap: 14 (ref 5–15)
BUN: 57 mg/dL — ABNORMAL HIGH (ref 8–23)
CO2: 24 mmol/L (ref 22–32)
Calcium: 8.2 mg/dL — ABNORMAL LOW (ref 8.9–10.3)
Chloride: 99 mmol/L (ref 98–111)
Creatinine, Ser: 1.56 mg/dL — ABNORMAL HIGH (ref 0.61–1.24)
GFR calc Af Amer: 52 mL/min — ABNORMAL LOW (ref >60)
GFR calc non Af Amer: 45 mL/min — ABNORMAL LOW (ref >60)
Glucose, Bld: 212 mg/dL — ABNORMAL HIGH (ref 70–99)
Potassium: 3.7 mmol/L (ref 3.5–5.1)
Sodium: 137 mmol/L (ref 135–145)
Total Bilirubin: 0.6 mg/dL (ref 0.3–1.2)
Total Protein: 5.5 g/dL — ABNORMAL LOW (ref 6.5–8.1)

## 2019-09-25 LAB — GLUCOSE, CAPILLARY
Glucose-Capillary: 193 mg/dL — ABNORMAL HIGH (ref 70–99)
Glucose-Capillary: 166 mg/dL — ABNORMAL HIGH (ref 70–99)
Glucose-Capillary: 218 mg/dL — ABNORMAL HIGH (ref 70–99)
Glucose-Capillary: 266 mg/dL — ABNORMAL HIGH (ref 70–99)

## 2019-09-25 NOTE — Evaluation (Signed)
Physical Therapy Assessment and Plan  Patient Details  Name: Jerry York MRN: [id number] Date of Birth: 08/24/1951  PT Diagnosis: Abnormality of gait, Contracture of joint: L ankle, Dizziness and giddiness, Low back pain, Muscle weakness and Pain in bil feet, laterally Rehab Potential: Excellent ELOS: 5-7   Today's Date: 09/25/2019 PT Individual Time: 9163-8466 PT Individual Time Calculation (min): 85 min    Problem List:  Patient Active Problem List   Diagnosis Date Noted  . Epidural hematoma ([place]) 09/24/2019  . S/P lumbar laminectomy 09/16/2019  . Lower extremity edema 12/11/2017  . Family history of premature CAD 11/27/2017  . 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 Type 2 diabetes mellitus ([place]) 08/09/2017  . Gout 08/09/2017  . Anxiety with insomnia 08/09/2017  . Hyperlipidemia associated with type 2 diabetes mellitus ([place]) 08/08/2017  . Hypertension associated with diabetes ([place]) 08/08/2017    Past Medical History:  Past Medical History:  Diagnosis Date  . Anxiety   . Bilateral renal cysts   . Gout    02-22-2017 acute right foot gout---  per pt resolved  . Gout   . Gross hematuria   . Heart murmur   . History of BPH 08/14/2017  . Hydronephrosis, left   . Hyperlipidemia   . Hypertension   . Renal insufficiency   . Rheumatoid arthritis ([place])   . Swelling   . Type 2 diabetes mellitus ([place])   . Urinary retention   . Wears glasses    Past Surgical History:  Past Surgical History:  Procedure Laterality Date  . APPENDECTOMY  age 68  . CYSTOSCOPY WITH URETEROSCOPY AND STENT PLACEMENT Left 05/08/2017   Procedure: URETEROSCOPY AND STENT PLACEMENT;  Surgeon: Kathie Rhodes, MD;  Location: [facility];  Service: Urology;  Laterality: Left;  . CYSTOSCOPY/RETROGRADE/URETEROSCOPY Bilateral 05/08/2017   Procedure: CYSTOSCOPY/ BILATERAL RETROGRADE;  Surgeon: Kathie Rhodes, MD;  Location: [facility];  Service:  Urology;  Laterality: Bilateral;  . HEMATOMA EVACUATION N/A 09/20/2019   Procedure: EVACUATION LUMBAR EPIDURAL HEMATOMA;  Surgeon: Newman Pies, MD;  Location: [place];  Service: Neurosurgery;  Laterality: N/A;  EVACUATION LUMBAR EPIDURAL HEMATOMA  . INGUINAL HERNIA REPAIR Right 10/10/2000  . KNEE ARTHROSCOPY Right 1980's  . LUMBAR LAMINECTOMY/DECOMPRESSION MICRODISCECTOMY N/A 09/16/2019   Procedure: Laminectomy and Foraminotomy - Lumbar Two-Lumbar Three - Lumbar Three-Lumbar Four - Lumbar Four-Lumbar Five - Lumbar Five-Sacral One;  Surgeon: Eustace Moore, MD;  Location: [facility];  Service: Neurosurgery;  Laterality: N/A;  Laminectomy and Foraminotomy - Lumbar Two-Lumbar Three - Lumbar Three-Lumbar Four - Lumbar Four-Lumbar Five - Lumbar Five-Sacral One  . NASAL FRACTURE SURGERY     in highschool   . SHOULDER ARTHROSCOPY WITH OPEN ROTATOR CUFF REPAIR Right 1990's  . TRANSURETHRAL RESECTION OF PROSTATE      Assessment & Plan Clinical Impression: Jerry York is a 68 year old right-handed male history of hypertension, rheumatoid arthritis, hyperlipidemia, type 2 diabetes mellitus, [name] Per chart review lives with spouse. 1 level home 3 steps to entry. Independent prior to most recent surgery 09/16/2019. Presented 09/20/2019 after recent decompressive lumbar laminectomy medial facetectomyand foraminotomies L2-3, 3-4, 4-5 and L5-S1 per Dr. Sherley Bounds. Discharge to home ambulating 250 feet minimal guard 09/18/2019. Presented 09/20/2019 with increasing urinary retention and decreased mobility. Follow-up imaging showed postoperative lumbar epidural hematoma. Underwent lumbar reexploration with removal of epidural hematoma L2-S1 09/20/2019 per Dr. Ronnald Ramp. Hospital course pain management. No brace required.  Patient transferred  to CIR on 09/24/2019 .   Patient currently requires min with mobility secondary to muscle weakness and muscle joint tightness, decreased  cardiorespiratoy endurance and decreased standing balance, decreased balance strategies and difficulty maintaining precautions.  Prior to hospitalization, patient was modified independent  with mobility and lived with Spouse in a House home.  Home access is 3, bil railsRamped entrance.  Patient will benefit from skilled PT intervention to maximize safe functional mobility, minimize fall risk and decrease caregiver burden for planned discharge home with intermittent assist.  Anticipate patient will benefit from follow up OP at discharge.  PT - End of Session Activity Tolerance: Tolerates 10 - 20 min activity with multiple rests Endurance Deficit: Yes Endurance Deficit Description: pt fatigued after gait x 100' PT Assessment Rehab Potential (ACUTE/IP ONLY): Excellent PT Patient demonstrates impairments in the following area(s): Balance;Endurance;Motor;Pain;Safety PT Transfers Functional Problem(s): Bed Mobility;Bed to Chair;Car;Furniture PT Locomotion Functional Problem(s): Ambulation;Stairs PT Plan PT Intensity: Minimum of 1-2 x/day ,45 to 90 minutes PT Frequency: 5 out of 7 days PT Duration Estimated Length of Stay: 5-7 PT Treatment/Interventions: Ambulation/gait training;Discharge planning;Functional mobility training;Therapeutic Activities;Balance/vestibular training;Neuromuscular re-education;Therapeutic Exercise;Wheelchair propulsion/positioning;DME/adaptive equipment instruction;Pain management;Splinting/orthotics;UE/LE Strength taining/ROM;Community reintegration;Functional electrical stimulation;Patient/family education;Stair training;UE/LE Coordination activities PT Transfers Anticipated Outcome(s): modified independent basic and car tr PT Locomotion Anticipated Outcome(s): modified independent gait x 150' level tile, carpet, up/down ramp; S up/down 12 steps 2 rails for community PT Recommendation Follow Up Recommendations: Outpatient PT Patient destination: Home Equipment Details:  owns a RW  Skilled Therapeutic Intervention Pt dozing in bed.  He stated that he was exhausted.  He stated that he is supposed to use his call bell to ask for pain meds, but keeps forgetting.  PT discussed pain mgt and timing of therapies with pt, and suggested he ask for pain meds.  Pt used call bell successfully.  Eval completed.  ELOS, POC and DC discussed with pt.   Therapeutic exercise performed with LE to increase strength for functional mobility: in supine- 25 x 1 bil ankle pumps; in sitting- attempted bil glut sets, no PT could not detect glut activation, 10 x 1 R/L hip extension against towel roll under q thigh.  Upon returning to room, pt's wife was there.  Pt reported that he needed to use toilet.  Toilet transfer with RW, BSC over toilet, CGA.  Pt continent of urine, taking extra time.  Hand washing at sink in standing, cues for spinal precautions, supervision.  Pt exhausted and requested getting back to bed.  At end of session, alarm set and needs at hand.   PT Evaluation Precautions/Restrictions Precautions Precautions: Fall;Back Precaution Booklet Issued: No Precaution Comments: no LSO Restrictions Weight Bearing Restrictions: No General   Vital Signs  Pain Pain Assessment Pain Scale: 0-10 Pain Score: 6  Pain Location: Back Pain Orientation: Lower Pain Intervention(s): Medication (See eMAR) Home Living/Prior Functioning Home Living Available Help at Discharge: Family Type of Home: House Home Access: Ramped entrance Entrance Stairs-Number of Steps: 3, bil rails Home Layout: One level Bathroom Shower/Tub: Walk-in shower;Door Technical brewer Accessibility: Yes Additional Comments: Pt reports that wife can help him with shoes if needed, but she does not like to be bothered  Lives With: Spouse Prior Function Level of Independence: Independent with gait;Independent with basic ADLs;Independent with homemaking with ambulation;Independent with  transfers  Able to Take Stairs?: Yes Driving: Yes Vocation: Retired Leisure: Hobbies-no Comments: independent prior to surgery on 9/21 Vision/Perception  Perception Perception: Within Functional Limits Praxis Praxis: Intact  Cognition Overall Cognitive Status: Within Functional Limits for tasks assessed Arousal/Alertness: Awake/alert Orientation Level: Disoriented to time;Oriented to person;Oriented to place;Oriented to situation(pt thought it was the end of Oct.) Memory: Appears intact Immediate Memory Recall: Sock;Blue;Bed Memory Recall Sock: Without Cue Memory Recall Blue: Not able to recall Memory Recall Bed: Without Cue Awareness: Impaired Awareness Impairment: Anticipatory impairment Problem Solving: Appears intact Behaviors: Impulsive Safety/Judgment: Impaired Comments: Decreased safety awareness/awareness of deficits Sensation Sensation Light Touch: Appears Intact Proprioception: Appears Intact Coordination Gross Motor Movements are Fluid and Coordinated: Yes Fine Motor Movements are Fluid and Coordinated: Yes Heel Shin Test: NT Motor  Motor Motor: Within Functional Limits Motor - Skilled Clinical Observations: generalized weakness; LLE > RLE  Mobility Bed Mobility Bed Mobility: Rolling Right;Rolling Left;Right Sidelying to Sit Rolling Right: Supervision/verbal cueing Rolling Left: Supervision/Verbal cueing Right Sidelying to Sit: Supervision/Verbal cueing Transfers Transfers: Sit to Stand;Stand to Sit;Stand Pivot Transfers Sit to Stand: Contact Guard/Touching assist;Supervision/Verbal cueing Stand to Sit: Contact Guard/Touching assist Stand Pivot Transfers: Contact Guard/Touching assist Transfer (Assistive device): Rolling walker Locomotion  Gait Ambulation: Yes Gait Assistance: Contact Guard/Touching assist Gait Distance (Feet): 180 Feet Assistive device: Rolling walker Gait Gait: Yes Gait Pattern: Impaired Gait Pattern: Step-through  pattern;Decreased step length - left;Decreased step length - right;Decreased trunk rotation;Narrow base of support Gait velocity: 1.17'/sec for 10 MWT  Stairs / Additional Locomotion Stairs: Yes Stairs Assistance: Contact Guard/Touching assist Stair Management Technique: Two rails Number of Stairs: 12 Height of Stairs: 3(and 6) Ramp: Contact Guard/touching assist Wheelchair Mobility Wheelchair Mobility: Yes Wheelchair Assistance: Chartered loss adjuster: Both upper extremities Wheelchair Parts Management: Supervision/cueing Distance: 150  Trunk/Postural Assessment  Cervical Assessment Cervical Assessment: Within Functional Limits Thoracic Assessment Thoracic Assessment: Within Functional Limits Lumbar Assessment Lumbar Assessment: Exceptions to WFL(Posterior pelvic tilt) Postural Control Postural Control: Within Functional Limits  Balance Balance Balance Assessed: Yes Static Sitting Balance Static Sitting - Balance Support: Feet supported Static Sitting - Level of Assistance: 7: Independent Static Sitting - Comment/# of Minutes: Sitting EOB Dynamic Sitting Balance Dynamic Sitting - Balance Support: Feet supported;During functional activity Dynamic Sitting - Level of Assistance: 5: Stand by assistance;4: Min assist Sitting balance - Comments: Sitting on shower chair to bathe. Static Standing Balance Static Standing - Balance Support: During functional activity;Right upper extremity supported;Left upper extremity supported Static Standing - Level of Assistance: 4: Min assist Dynamic Standing Balance Dynamic Standing - Balance Support: During functional activity;Right upper extremity supported;Left upper extremity supported Dynamic Standing - Level of Assistance: 4: Min assist Dynamic Standing - Comments: Standing to complete LB clothing management Extremity Assessment      RLE Assessment RLE Assessment: Within Functional Limits Passive Range of  Motion (PROM) Comments: tight hamstrings and heel cords General Strength Comments: DF mostly toe extension, painful corn 5th met head LLE Assessment LLE Assessment: Exceptions to [facility] Passive Range of Motion (PROM) Comments: tight hamstrings and heel cords General Strength Comments: grossly in sitting, hip and knee 4/5    Refer to Care Plan for Long Term Goals  Recommendations for other services: Therapeutic Recreation  Other leisure pursuits  Discharge Criteria: Patient will be discharged from PT if patient refuses treatment 3 consecutive times without medical reason, if treatment goals not met, if there is a change in medical status, if patient makes no progress towards goals or if patient is discharged from hospital.  The above assessment, treatment plan, treatment alternatives and goals were discussed and mutually agreed upon: by patient and by family  Kehaulani Fruin 09/25/2019, 12:35  PM  

## 2019-09-25 NOTE — Plan of Care (Signed)
  Problem: Consults Goal: RH SPINAL CORD INJURY PATIENT EDUCATION Description:  See Patient Education module for education specifics.  09/25/2019 1553 by Gae Dry D, LPN Outcome: Progressing 09/25/2019 1553 by Gae Dry D, LPN Outcome: Progressing   Problem: SCI BOWEL ELIMINATION Goal: RH STG MANAGE BOWEL WITH ASSISTANCE Description: STG Manage Bowel with Mod I Assistance. 09/25/2019 1553 by Gae Dry D, LPN Outcome: Progressing 09/25/2019 1553 by Gae Dry D, LPN Outcome: Progressing Goal: RH STG SCI MANAGE BOWEL WITH MEDICATION WITH ASSISTANCE Description: STG SCI Manage bowel with medication with Mod I assistance. 09/25/2019 1553 by Gae Dry D, LPN Outcome: Progressing 09/25/2019 1553 by Gae Dry D, LPN Outcome: Progressing   Problem: SCI BLADDER ELIMINATION Goal: RH STG MANAGE BLADDER WITH ASSISTANCE Description: STG Manage Bladder With Mod I Assistance 09/25/2019 1553 by Gae Dry D, LPN Outcome: Progressing 09/25/2019 1553 by Gae Dry D, LPN Outcome: Progressing Goal: RH STG MANAGE BLADDER WITH MEDICATION WITH ASSISTANCE Description: STG Manage Bladder With Medication With Mod I Assistance. 09/25/2019 1553 by Gae Dry D, LPN Outcome: Progressing 09/25/2019 1553 by Gae Dry D, LPN Outcome: Progressing Goal: RH STG MANAGE BLADDER WITH EQUIPMENT WITH ASSISTANCE Description: STG Manage Bladder With Equipment With Min Assistance 09/25/2019 1553 by Gae Dry D, LPN Outcome: Progressing 09/25/2019 1553 by Gae Dry D, LPN Outcome: Progressing   Problem: RH SKIN INTEGRITY Goal: RH STG ABLE TO PERFORM INCISION/WOUND CARE W/ASSISTANCE Description: STG Able To Perform Incision/Wound Care With [facility]. 09/25/2019 1553 by Gae Dry D, LPN Outcome: Progressing 09/25/2019 1553 by Gae Dry D, LPN Outcome: Progressing   Problem: RH PAIN MANAGEMENT Goal: RH STG PAIN MANAGED AT OR BELOW PT'S PAIN GOAL Description: < 4 out  of 10.  09/25/2019 1553 by Gae Dry D, LPN Outcome: Progressing 09/25/2019 1553 by Gae Dry D, LPN Outcome: Progressing

## 2019-09-25 NOTE — Evaluation (Signed)
Occupational Therapy Assessment and Plan  Patient Details  Name: Jerry York MRN: [id number] Date of Birth: [birth year]  OT Diagnosis: acute pain, muscle weakness (generalized) and pain in thoracic spine Rehab Potential: Rehab Potential (ACUTE ONLY): Excellent ELOS: 4-6 days   Today's Date: 09/25/2019 OT Individual Time: 5456-2563 and 1310-1340 OT Individual Time Calculation (min): 60 min   And 30 min  Problem List:  Patient Active Problem List   Diagnosis Date Noted  . Epidural hematoma ([place]) 09/24/2019  . S/P lumbar laminectomy 09/16/2019  . Lower extremity edema 12/11/2017  . Family history of premature CAD 11/27/2017  . 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 Type 2 diabetes mellitus ([place]) 08/09/2017  . Gout 08/09/2017  . Anxiety with insomnia 08/09/2017  . Hyperlipidemia associated with type 2 diabetes mellitus ([place]) 08/08/2017  . Hypertension associated with diabetes ([place]) 08/08/2017    Past Medical History:  Past Medical History:  Diagnosis Date  . Anxiety   . Bilateral renal cysts   . Gout    02-22-2017 acute right foot gout---  per pt resolved  . Gout   . Gross hematuria   . Heart murmur   . History of BPH 08/14/2017  . Hydronephrosis, left   . Hyperlipidemia   . Hypertension   . Renal insufficiency   . Rheumatoid arthritis ([place])   . Swelling   . Type 2 diabetes mellitus ([place])   . Urinary retention   . Wears glasses    Past Surgical History:  Past Surgical History:  Procedure Laterality Date  . APPENDECTOMY  age 37  . CYSTOSCOPY WITH URETEROSCOPY AND STENT PLACEMENT Left 05/08/2017   Procedure: URETEROSCOPY AND STENT PLACEMENT;  Surgeon: Kathie Rhodes, MD;  Location: [facility];  Service: Urology;  Laterality: Left;  . CYSTOSCOPY/RETROGRADE/URETEROSCOPY Bilateral 05/08/2017   Procedure: CYSTOSCOPY/ BILATERAL RETROGRADE;  Surgeon: Kathie Rhodes, MD;  Location: [facility];  Service:  Urology;  Laterality: Bilateral;  . HEMATOMA EVACUATION N/A 09/20/2019   Procedure: EVACUATION LUMBAR EPIDURAL HEMATOMA;  Surgeon: Newman Pies, MD;  Location: [place];  Service: Neurosurgery;  Laterality: N/A;  EVACUATION LUMBAR EPIDURAL HEMATOMA  . INGUINAL HERNIA REPAIR Right 10/10/2000  . KNEE ARTHROSCOPY Right 1980's  . LUMBAR LAMINECTOMY/DECOMPRESSION MICRODISCECTOMY N/A 09/16/2019   Procedure: Laminectomy and Foraminotomy - Lumbar Two-Lumbar Three - Lumbar Three-Lumbar Four - Lumbar Four-Lumbar Five - Lumbar Five-Sacral One;  Surgeon: Eustace Moore, MD;  Location: [facility] OR;  Service: Neurosurgery;  Laterality: N/A;  Laminectomy and Foraminotomy - Lumbar Two-Lumbar Three - Lumbar Three-Lumbar Four - Lumbar Four-Lumbar Five - Lumbar Five-Sacral One  . NASAL FRACTURE SURGERY     in highschool   . SHOULDER ARTHROSCOPY WITH OPEN ROTATOR CUFF REPAIR Right 1990's  . TRANSURETHRAL RESECTION OF PROSTATE      Assessment & Plan Clinical Impression: Jerry York is a 68 year old right-handed male history of hypertension, rheumatoid arthritis, hyperlipidemia, type 2 diabetes mellitus, [name] Per chart review lives with spouse. 1 level home 3 steps to entry. Independent prior to most recent surgery 09/16/2019. Presented 09/20/2019 after recent decompressive lumbar laminectomy medial facetectomyand foraminotomies L2-3, 3-4, 4-5 and L5-S1 per Dr. Sherley Bounds. Discharge to home ambulating 250 feet minimal guard 09/18/2019. Presented 09/20/2019 with increasing urinary retention and decreased mobility. Follow-up imaging showed postoperative lumbar epidural hematoma. Underwent lumbar reexploration with removal of epidural hematoma L2-S1 09/20/2019 per Dr. Ronnald Ramp. Hospital course pain management. No brace required. Therapy evaluation completed and patient  was admitted for a comprehensive rehab program.Patient transferred to CIR on 09/24/2019 .    Patient currently requires  min with basic self-care skills secondary to muscle weakness, decreased cardiorespiratoy endurance and decreased standing balance, decreased postural control, decreased balance strategies and difficulty maintaining precautions.  Prior to surgery on 9/21 pt was indep. He required min guard assist at d/c home following surgery.  Patient will benefit from skilled intervention to decrease level of assist with basic self-care skills and increase independence with basic self-care skills prior to discharge home with care partner.  Anticipate patient will require intermittent supervision and follow up home health.  OT - End of Session Activity Tolerance: Tolerates 30+ min activity with multiple rests Endurance Deficit: Yes OT Assessment Rehab Potential (ACUTE ONLY): Excellent OT Patient demonstrates impairments in the following area(s): Balance;Endurance;Pain;Safety OT Basic ADL's Functional Problem(s): Grooming;Bathing;Dressing;Toileting OT Transfers Functional Problem(s): Toilet;Tub/Shower OT Additional Impairment(s): None OT Plan OT Intensity: Minimum of 1-2 x/day, 45 to 90 minutes OT Frequency: 5 out of 7 days OT Duration/Estimated Length of Stay: 4-6 days OT Treatment/Interventions: Balance/vestibular training;DME/adaptive equipment instruction;Patient/family education;Therapeutic Activities;Therapeutic Exercise;Community reintegration;Functional mobility training;Self Care/advanced ADL retraining;UE/LE Strength taining/ROM;UE/LE Coordination activities;Neuromuscular re-education;Discharge planning;Pain management;Splinting/orthotics OT Self Feeding Anticipated Outcome(s): Indep OT Basic Self-Care Anticipated Outcome(s): Mod I OT Toileting Anticipated Outcome(s): Mod I OT Bathroom Transfers Anticipated Outcome(s): Supervision-mod I OT Recommendation Patient destination: Home Follow Up Recommendations: Home health OT Equipment Recommended: To be determined   Skilled Therapeutic  Intervention Session One: Pt seen for OT eval and ADL bathing/dressing session. Pt sitting EOB upon arrival, agreeable to tx session. Denied pain and reports being pre-medicated prior to tx session.  He ambulated throughout room with RW and min A. VCs for RW management in functional context. He bathed seated on shower chair in shower with steadying assist when standing to complete pericare/buttock hygiene.  He returned to chair to dress, steadying assist to pull pants up.  Following seated rest break, ambulated with HHA to sink and completed grooming tasks standing at sink, initially steadying assist progressing to supervision with UE support.  He then ambulated to ADL apartment with RW and CGA. Pt able to initiate need for seated rest break following ~29f ambulation. Completed sit>stand from low soft surface couch with steadying assist. He returned to room, no need for seated rest break.  He returned to room and left seated in standard chair, chair pad alarm on and all needs in reach.  Education provided throughout session regarding role of OT, POC, OT/PT goals, back pre-cautions, need for use of call bell and d/c planning.   Session Two: Pt seen for OT session focusing on functional transfers and mobility. Pt sitting up in bed upon arrival, agreeable to tx session. No pain at rest, however, complaints of soreness/stiffness with mobility. RN made aware and administered pain medications during session. He ambulated throughout session with RW and close supervision, VCs for RW management and hand palcement during sit<>Stand. Completed simulated shower stall transfer as he has at home. Following demonstration for technique, pt return demonstrated with guarding assist. Sit<>Stand from shower chair with supervision. Sit<>Stand from low surface couch with supervision.  In therapy gym, completed x2 sets of x5 sit<>stand from standard chair without armrests with guarding assist for LE strengthening.  Pt  returned to room at end of session, transitioned back to bed. Left with all needs in reach and bed alarm on.   OT Evaluation Precautions/Restrictions  Precautions Precautions: Fall;Back Precaution Booklet Issued: No Precaution Comments: no LSO Restrictions  Weight Bearing Restrictions: No General Chart Reviewed: Yes Home Living/Prior Functioning Home Living Family/patient expects to be discharged to:: Private residence Living Arrangements: Spouse/significant other Vision Baseline Vision/History: Wears glasses Wears Glasses: At all times Patient Visual Report: No change from baseline Vision Assessment?: No apparent visual deficits Perception  Perception: Within Functional Limits Praxis Praxis: Intact Cognition Overall Cognitive Status: Within Functional Limits for tasks assessed Arousal/Alertness: Awake/alert Orientation Level: Person;Place;Situation Person: Oriented Place: Oriented Situation: Oriented Year: 2020 Month: October Day of Week: Correct Memory: Appears intact Immediate Memory Recall: Sock;Blue;Bed Memory Recall Sock: Without Cue Memory Recall Blue: Not able to recall Memory Recall Bed: Without Cue Awareness: Impaired Awareness Impairment: Anticipatory impairment Problem Solving: Appears intact Behaviors: Impulsive Safety/Judgment: Impaired Comments: Decreased safety awareness/awareness of deficits Sensation Sensation Light Touch: Appears Intact Proprioception: Appears Intact Coordination Gross Motor Movements are Fluid and Coordinated: Yes Fine Motor Movements are Fluid and Coordinated: Yes Heel Shin Test: NT Motor  Motor Motor: Within Functional Limits Motor - Skilled Clinical Observations: generalized weakness; LLE > RLE Trunk/Postural Assessment  Cervical Assessment Cervical Assessment: Within Functional Limits Thoracic Assessment Thoracic Assessment: Within Functional Limits Lumbar Assessment Lumbar Assessment: Exceptions to WFL(Posterior  pelvic tilt) Postural Control Postural Control: Within Functional Limits  Balance Balance Balance Assessed: Yes Static Sitting Balance Static Sitting - Balance Support: Feet supported Static Sitting - Level of Assistance: 7: Independent Static Sitting - Comment/# of Minutes: Sitting EOB Dynamic Sitting Balance Dynamic Sitting - Balance Support: Feet supported;During functional activity Dynamic Sitting - Level of Assistance: 5: Stand by assistance;4: Min assist Sitting balance - Comments: Sitting on shower chair to bathe. Static Standing Balance Static Standing - Balance Support: During functional activity;Right upper extremity supported;Left upper extremity supported Static Standing - Level of Assistance: 4: Min assist Dynamic Standing Balance Dynamic Standing - Balance Support: During functional activity;Right upper extremity supported;Left upper extremity supported Dynamic Standing - Level of Assistance: 4: Min assist Dynamic Standing - Comments: Standing to complete LB clothing management Extremity/Trunk Assessment RUE Assessment RUE Assessment: Exceptions to [facility] Active Range of Motion (AROM) Comments: ~100 degrees shoulder flexion with discomfort. All other major muscle groups [facility] General Strength Comments: elbow flexion/extention 5/5 RUE Body System: Ortho RUE AROM (degrees) RUE Overall AROM Comments: hx R rotator cuff surgery with impaired shoulder flexion LUE Assessment LUE Assessment: Exceptions to [facility] Active Range of Motion (AROM) Comments: ~100 degrees shoulder flexion with discomfort. All other major muscle groups [facility] General Strength Comments: elbow flexion/etention 5/5 LUE Body System: Ortho LUE AROM (degrees) Overall AROM Left Upper Extremity: Deficits;Due to premorbid status LUE Overall AROM Comments: hx of shoulder/rotator cuff injury- no surgery     Refer to Care Plan for Long Term Goals  Recommendations for other services: None    Discharge Criteria:  Patient will be discharged from OT if patient refuses treatment 3 consecutive times without medical reason, if treatment goals not met, if there is a change in medical status, if patient makes no progress towards goals or if patient is discharged from hospital.  The above assessment, treatment plan, treatment alternatives and goals were discussed and mutually agreed upon: by patient  Marialuiza Car L 09/25/2019, 7:06 AM

## 2019-09-25 NOTE — Progress Notes (Signed)
[facility] PHYSICAL MEDICINE & REHABILITATION PROGRESS NOTE   Subjective/Complaints: Pt reports doing well- pain pretty well controlled- slept "pretty well" overnight.  No other issues, per pt.   Objective:   No results found. Recent Labs    09/25/19 0458  WBC 3.7*  HGB 12.4*  HCT 35.5*  PLT 154   Recent Labs    09/25/19 0458  NA 137  K 3.7  CL 99  CO2 24  GLUCOSE 212*  BUN 57*  CREATININE 1.56*  CALCIUM 8.2*    Intake/Output Summary (Last 24 hours) at 09/25/2019 1351 Last data filed at 09/25/2019 0700 Gross per 24 hour  Intake 580 ml  Output 75 ml  Net 505 ml     Physical Exam: Vital Signs Blood pressure 140/77, pulse 64, temperature 98.4 F (36.9 C), temperature source Oral, resp. rate 16, height 5\' 6"  (1.676 m), weight 85.8 kg, SpO2 96 %.  Constitutional: Labs and vitals reviewed Pt awake, alert, appropriate, sitting up in bed; eating breakfast- 1/2 done, NAD  HENT:  Head:Normocephalicand atraumatic.  Mouth/Throat:Oropharynx is clear and moist. Nooropharyngeal exudate.  Eyes:EOMare normal.   Neck:Normal range of motion..  Cardiovascular:Normal rateand regular rhythm.  Respiratory:Effort normal. Norespiratory distress. He hasno wheezes. He hasno rales.  . He exhibitsno distension. There isno abdominal tenderness.  Musculoskeletal:Normal range of motion.  General: No edema.  Comments: Low back TTP Neurological: He is alertand oriented to person, place, and time. Nocranial nerve deficit. Normal insight and awareness. Motor 5/5 UE and 4/5 LE prox to distal. No sensory findings. Skin: He isnot diaphoretic. Back incision clean and dry with Steri-Strips in place, wound well-approximated-no erythema; no drainage Psychiatric: He has a normal mood and affect. Hisbehavior is normal.     Assessment/Plan: 1. Functional deficits secondary to lumbar radiculopathy/ to lumbar epidural hematoma status post removal of  epidural hematoma L2 S1 09/20/2019 after decompressive lumbar laminectomy and foraminotomies L2-3, 3-4, L4-5, L5-S1which require 3+ hours per day of interdisciplinary therapy in a comprehensive inpatient rehab setting.  Physiatrist is providing close team supervision and 24 hour management of active medical problems listed below.  Physiatrist and rehab team continue to assess barriers to discharge/monitor patient progress toward functional and medical goals  Care Tool:  Bathing    Body parts bathed by patient: Right arm, Left arm, Chest, Abdomen, Front perineal area, Buttocks, Right upper leg, Left upper leg, Right lower leg, Left lower leg, Face         Bathing assist Assist Level: Contact Guard/Touching assist     Upper Body Dressing/Undressing Upper body dressing   What is the patient wearing?: Pull over shirt    Upper body assist Assist Level: Set up assist    Lower Body Dressing/Undressing Lower body dressing      What is the patient wearing?: Pants     Lower body assist Assist for lower body dressing: Contact Guard/Touching assist     Toileting Toileting    Toileting assist Assist for toileting: Minimal Assistance - Patient > 75%     Transfers Chair/bed transfer  Transfers assist     Chair/bed transfer assist level: Contact Guard/Touching assist     Locomotion Ambulation   Ambulation assist      Assist level: Contact Guard/Touching assist Assistive device: Walker-rolling Max distance: 180   Walk 10 feet activity   Assist     Assist level: Contact Guard/Touching assist Assistive device: Walker-rolling   Walk 50 feet activity   Assist    Assist level: Contact  Guard/Touching assist Assistive device: Walker-rolling    Walk 150 feet activity   Assist    Assist level: Supervision/Verbal cueing Assistive device: Walker-rolling    Walk 10 feet on uneven surface  activity   Assist Walk 10 feet on uneven surfaces activity did not  occur: Safety/medical concerns(recent spinal surgery x 2)         Wheelchair     Assist   Type of Wheelchair: Manual    Wheelchair assist level: Supervision/Verbal cueing Max wheelchair distance: 150    Wheelchair 50 feet with 2 turns activity    Assist        Assist Level: Supervision/Verbal cueing   Wheelchair 150 feet activity     Assist      Assist Level: Supervision/Verbal cueing   Blood pressure 140/77, pulse 64, temperature 98.4 F (36.9 C), temperature source Oral, resp. rate 16, height 5\' 6"  (1.676 m), weight 85.8 kg, SpO2 96 %.  Medical Problem List and Plan: 1.Decreased functional mobilitysecondary to lumbar epidural hematoma status post removal of epidural hematoma L2 S1 09/20/2019 after decompressive lumbar laminectomy and foraminotomies L2-3, 3-4, L4-5, L5-S1 09/16/2019. No back brace required -admit to inpatient rehab             -ELOS 7 days, Mod I goals 2. Antithrombotics: -DVT/anticoagulation:SCD.               -dopplers not need, pt ambulatory -antiplatelet therapy: N/A 3. Pain Management:Oxycodone and Flexeril as needed 4. Mood:Klonopin 0.5 mg twice daily, trazodone 200 mg nightly as needed -antipsychotic agents: N/A 5. Neuropsych: This patientiscapable of making decisions on hisown behalf. 6. Skin/Wound Care:Routine skin checks 7. Fluids/Electrolytes/Nutrition:Routine in and outs with follow-up chemistries 8. Hypertension. Norvasc 5 mg daily, Lasix 80 mg twice daily, Lopressor 100 mg twice daily -monitor bp's with activity 9. Urinary retention. Remove foley at discharge -begin voiding trial, check pvr's -I/O caths prn. Was apparently I/O cathing at home 10. Rheumatoid arthritis. 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 Creatinine baseline 1.29-1.72. Continue Lasix as prior to admission  9/30- Cr 1.56- but BUN has jumped to 57 from high  30s- will ask him to drink more- recheck Friday-  12. History of gout. Colchicine 0.6 mg daily and Zyloprim 300 mg daily.  -no flare ups at present 13. Diabetes mellitus. Hemoglobin A1c 8.1. SSI. With cbg's tid/ac&hs -monitor for trends CBG (last 3)  Recent Labs    09/24/19 2055 09/25/19 0632 09/25/19 1217  GLUCAP 321* 193* 166*   9/30- BGs elevated- will monitor and make changes tomorrow if still elevated/need a trend.  14. Constipation. Laxative assistance 15. Leukopenia  9/30- will recheck Friday- no Sx's of illness     LOS: 1 days A FACE TO FACE EVALUATION WAS PERFORMED  Shawne Eskelson 09/25/2019, 1:51 PM

## 2019-09-25 NOTE — Progress Notes (Signed)
Physical Therapy Session Note  Patient Details  Name: Jerry York MRN: [id number] Date of Birth: 1951/07/28  Today's Date: 09/25/2019 PT Individual Time: 6010-9323 PT Individual Time Calculation (min): 35 min   Short Term Goals: Week 1:  PT Short Term Goal 1 (Week 1): = LTGs due to ELOS  Skilled Therapeutic Interventions/Progress Updates:  Pt received supine in bed asleep, slow to be aroused but then agreeable to PT session. No complaints of pain. Supine to sit with Supervision. Sit to stand with CGA to RW. Ambulation 2 x 150 ft with RW and close Supervision. Patient demonstrates increased fall risk as noted by score of 26 /56 on Berg Balance Scale.  (<36= high risk for falls, close to 100%; 37-45 significant >80%; 46-51 moderate >50%; 52-55 lower >25%). Reviewed score and functional implications, pt states understanding. Assisted pt back to bed at end of session, Supervision for bed mobility. Pt left semi-reclined in bed with needs in reach, hot pack to low back for pain relief.  Therapy Documentation Precautions:  Precautions Precautions: Fall, Back Precaution Booklet Issued: No Precaution Comments: no LSO Restrictions Weight Bearing Restrictions: No  Balance: Balance Balance Assessed: Yes Standardized Balance Assessment Standardized Balance Assessment: Berg Balance Test Berg Balance Test Sit to Stand: Able to stand without using hands and stabilize independently Standing Unsupported: Able to stand 2 minutes with supervision Sitting with Back Unsupported but Feet Supported on Floor or Stool: Able to sit safely and securely 2 minutes Stand to Sit: Sits safely with minimal use of hands Transfers: Needs one person to assist Standing Unsupported with Eyes Closed: Able to stand 10 seconds with supervision Standing Ubsupported with Feet Together: Able to place feet together independently but unable to hold for 30 seconds From Standing, Reach Forward with Outstretched Arm: Reaches  forward but needs supervision From Standing Position, Pick up Object from Floor: Unable to try/needs assist to keep balance From Standing Position, Turn to Look Behind Over each Shoulder: Turn sideways only but maintains balance Turn 360 Degrees: Needs assistance while turning Standing Unsupported, Alternately Place Feet on Step/Stool: Needs assistance to keep from falling or unable to try Standing Unsupported, One Foot in Front: Needs help to step but can hold 15 seconds Standing on One Leg: Tries to lift leg/unable to hold 3 seconds but remains standing independently Total Score: 26     Therapy/Group: Individual Therapy Excell Seltzer, PT, DPT  09/25/2019, 3:49 PM

## 2019-09-26 ENCOUNTER — Inpatient Hospital Stay (HOSPITAL_COMMUNITY): Payer: PPO | Admitting: Occupational Therapy

## 2019-09-26 ENCOUNTER — Telehealth (INDEPENDENT_AMBULATORY_CARE_PROVIDER_SITE_OTHER): Payer: PPO | Admitting: Family Medicine

## 2019-09-26 ENCOUNTER — Inpatient Hospital Stay (HOSPITAL_COMMUNITY): Payer: PPO | Admitting: Physical Therapy

## 2019-09-26 ENCOUNTER — Inpatient Hospital Stay (HOSPITAL_COMMUNITY): Payer: PPO

## 2019-09-26 LAB — GLUCOSE, CAPILLARY
Glucose-Capillary: 237 mg/dL — ABNORMAL HIGH (ref 70–99)
Glucose-Capillary: 137 mg/dL — ABNORMAL HIGH (ref 70–99)
Glucose-Capillary: 168 mg/dL — ABNORMAL HIGH (ref 70–99)
Glucose-Capillary: 267 mg/dL — ABNORMAL HIGH (ref 70–99)

## 2019-09-26 MED ORDER — CLONAZEPAM 0.5 MG PO TABS: 0.5000 mg | ORAL_TABLET | Freq: Two times a day (BID) | ORAL | 0 refills | Status: DC

## 2019-09-26 MED ORDER — COLCHICINE 0.6 MG PO TABS: 0.6000 mg | ORAL_TABLET | Freq: Every day | ORAL | 0 refills | Status: DC

## 2019-09-26 MED ORDER — OXYCODONE-ACETAMINOPHEN 5-325 MG PO TABS: 1.0000 | ORAL_TABLET | ORAL | 0 refills | Status: DC | PRN

## 2019-09-26 MED ORDER — ACETAMINOPHEN 325 MG PO TABS: 650.0000 mg | ORAL_TABLET | ORAL | Status: DC | PRN

## 2019-09-26 MED ORDER — METOPROLOL TARTRATE 100 MG PO TABS: 100.0000 mg | ORAL_TABLET | Freq: Two times a day (BID) | ORAL | 3 refills | Status: DC

## 2019-09-26 MED ORDER — MELOXICAM 7.5 MG PO TABS: 7.5000 mg | ORAL_TABLET | Freq: Two times a day (BID) | ORAL | 0 refills | Status: DC

## 2019-09-26 MED ORDER — OXYCODONE HCL 5 MG PO TABS: 5.0000 mg | ORAL_TABLET | ORAL | 0 refills | Status: DC | PRN

## 2019-09-26 MED ORDER — AMLODIPINE BESYLATE 5 MG PO TABS: 5.0000 mg | ORAL_TABLET | Freq: Every day | ORAL | 1 refills | Status: DC

## 2019-09-26 MED ORDER — CYCLOBENZAPRINE HCL 5 MG PO TABS: 5.0000 mg | ORAL_TABLET | Freq: Three times a day (TID) | ORAL | 0 refills | Status: DC | PRN

## 2019-09-26 MED ORDER — ALLOPURINOL 100 MG PO TABS: 100.0000 mg | ORAL_TABLET | Freq: Three times a day (TID) | ORAL | 0 refills | Status: DC

## 2019-09-26 MED ORDER — FUROSEMIDE 80 MG PO TABS: 80.0000 mg | ORAL_TABLET | Freq: Two times a day (BID) | ORAL | 3 refills | Status: DC

## 2019-09-26 NOTE — Progress Notes (Addendum)
Occupational Therapy Session Note  Patient Details  Name: Jerry York MRN: [id number] Date of Birth: 10-04-1951  Today's Date: 09/26/2019 OT Individual Time: 9242-6834 OT Individual Time Calculation (min): 70 min    Short Term Goals: Week 1:  OT Short Term Goal 1 (Week 1): STG=LTG due to LOS  Skilled Therapeutic Interventions/Progress Updates:    Session One: Pt seen for OT session focusing on ADL re-training and functional activity tolerance and balance. Pt awake in supine upon arrival with RN present. Pt agreeable to tx session. Complaints of soreness in lower back, reports heating pad yesterday helped to dull pain. Reports being up to date on pain meds and heat applied for comfort at end of session.  He ambulated throughout room with supervision using RW. Grooming tasks standing at sink with supervision. Toileting transfers and task completed with supervision using BSC over toilet. Hand hygiene standing at sink with supervision.  Therapist placed pt's suitcase so he could access items. Pt requiring significantly increased time for problem solving how to open suitcase. From seated position donned shoes with supervision while maintaining back pre-cautions. He ambulated to therapy gym using RW with supervision. Completed dynamic standing task without use of AD. First trial standing on standard tile floor, second trial standing on non-complaint foam surface  In order to strengthen stabilizer muscles. Min A for balance and tolerating ~2 minutes before requiring seated rest break.  He then had more complaints of back stiffness. Positioned in supine on therapy mat with heating pad applied to back. Completed LE there-ex from supine, x2 sets of 10 hell slides on R/L and x10 clam shells on R/L.  Pt retunred to room at end of session, returned to supine withall needs in reach, heat pack applied to lower back for comfort. Education/discussion throughout session regarding PLOF, personal goals, DME,  continuum of care, energy conservation and d/c planning.   Session Two: Pt seen for OT ADL bathing/dressing session. Pt asleep in supine upon arrival, easily awoken and agreeable to tx session, plan for showering during session.  He ambulated throughout room with RW and supervision to gather needed items for shower, VCs for use of RW management in functional context. He bathed seated on shower chair with supervision. VCs required throughout bathing/dressing routine for safety awareness, pt standing to doff all clothing with CGA and standing and turning around in circles in shower in order to rinse back. Encouraged use of HH shower head vs. Standing and turning in shower.  Supervision for UB/LB dressing sit>stand at RW.  Following seated rest break, he ambulated pushing w/c off unit to outside courtyard. Pt denied need for seated rest break though did utilize standing rest breaks throughout. Supervision for community mobility. Seated rest break on standard part bench with supervision to stand from. Discussed d/c planning, pt's goals and return to activities.  Pt returned to room in same manner as described above. Despite encouragement for OOB, pt opting to return to bed. Pt left in supine with all needs in reach and bed alarm on. Heat pack provided for comfort.   Therapy Documentation Precautions:  Precautions Precautions: Fall, Back Precaution Booklet Issued: No Precaution Comments: no LSO Restrictions Weight Bearing Restrictions: No   Therapy/Group: Individual Therapy  Merna Baldi L 09/26/2019, 6:55 AM

## 2019-09-26 NOTE — Patient Care Conference (Signed)
Inpatient RehabilitationTeam Conference and Plan of Care Update Date: 09/25/2019   Time: 9:55 AM    Patient Name: Jerry York      Medical Record Number: [id number]  Date of Birth: [birth year] Sex: Male         Room/Bed: 4M02C/4M02C-01 Payor Info: Payor: [employer] / Plan: [employer] PPO / Product Type: *No Product type* /    Admit Date/Time:  09/24/2019  3:29 PM  Primary Diagnosis:  Epidural hematoma ([place])  Patient Active Problem List   Diagnosis Date Noted  . Neurogenic bladder 09/25/2019  . Epidural hematoma ([place]) 09/24/2019  . S/P lumbar laminectomy 09/16/2019  . Lower extremity edema 12/11/2017  . Family history of premature CAD 11/27/2017  . 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 Type 2 diabetes mellitus ([place]) 08/09/2017  . Gout 08/09/2017  . Anxiety with insomnia 08/09/2017  . Hyperlipidemia associated with type 2 diabetes mellitus ([place]) 08/08/2017  . Hypertension associated with diabetes ([place]) 08/08/2017    Expected Discharge Date: Expected Discharge Date: 09/29/19  Team Members Present: Physician leading conference: Dr. Alysia Penna Social Worker Present: Lennart Pall, LCSW Nurse Present: Dorthula Nettles, RN PT Present: Excell Seltzer, PT OT Present: Amy Rounds, OT PPS Coordinator present : Gunnar Fusi, SLP     Current Status/Progress Goal Weekly Team Focus  Bowel/Bladder   Continent of B/B  Remains continent  Encourage pt to use the bathroom. Mini assist   Swallow/Nutrition/ Hydration             ADL's   CGA-min A overall  Supervision-mod I overall  ADL re-training, functional activity tolerance, standing balance/endurance and d/c planning.   Mobility   Supervision to CGA overall  mod I overall  balance, endurance, strengthening   Communication             Safety/Cognition/ Behavioral Observations  Alert and oriented X4, calls appropriately  Educate patient to call for assistance  Free from  injuries   Pain   Pain level of 7 on the pain scale of 0 -10. PRN pain med ordered  </= 3 on pain scale  Assess for pain q4hrs and implement intervention as needed   Skin   Surgical site at mid/lower back with steri-strip. Had scant amount of serosangunious drainage  Free from infection or dehiscence  Assess skin for infection q shift and PRN    Rehab Goals Patient on target to meet rehab goals: Yes *See Care Plan and progress notes for long and short-term goals.     Barriers to Discharge  Current Status/Progress Possible Resolutions Date Resolved   Nursing  Wound Care               PT                    OT                  SLP                SW                Discharge Planning/Teaching Needs:  Home with wife who can provide supervision only.  Teaching needs TBD   Team Discussion:  New eval;  Close supervision - CGA with OT on eval and mod ind goals.  PT eval pending.  Foley out and cont b/b.  Anticipate very short LOS.  Revisions to Treatment Plan:  NA    Medical Summary Current  Status: pt requiring intermittent in/out caths; constipated; Cr/BUN elevated- recheck Friday along with WBC of 3.7 Weekly Focus/Goal: improve Renal function  Barriers to Discharge: Decreased family/caregiver support;Medication compliance;Neurogenic Bowel & Bladder  Barriers to Discharge Comments: n/a Possible Resolutions to Barriers: family training   Continued Need for Acute Rehabilitation Level of Care: The patient requires daily medical management by a physician with specialized training in physical medicine and rehabilitation for the following reasons: Direction of a multidisciplinary physical rehabilitation program to maximize functional independence : Yes Medical management of patient stability for increased activity during participation in an intensive rehabilitation regime.: Yes Analysis of laboratory values and/or radiology reports with any subsequent need for medication adjustment  and/or medical intervention. : Yes   I attest that I was present, lead the team conference, and concur with the assessment and plan of the team.   Bernyce Brimley 09/26/2019, 3:53 PM   Team conference was held via web/ teleconference due to COVID - 19

## 2019-09-26 NOTE — Progress Notes (Signed)
[place] PHYSICAL MEDICINE & REHABILITATION PROGRESS NOTE   Subjective/Complaints: Pt reports he would like a kpad- was helpful when used heating pad yesterday.  Denies any other issues  ROS: Pt denies CP, SOB, N/V/D.   Objective:   No results found. Recent Labs    09/25/19 0458  WBC 3.7*  HGB 12.4*  HCT 35.5*  PLT 154   Recent Labs    09/25/19 0458  NA 137  K 3.7  CL 99  CO2 24  GLUCOSE 212*  BUN 57*  CREATININE 1.56*  CALCIUM 8.2*    Intake/Output Summary (Last 24 hours) at 09/26/2019 0853 Last data filed at 09/26/2019 4944 Gross per 24 hour  Intake 840 ml  Output -  Net 840 ml     Physical Exam: Vital Signs Blood pressure 111/62, pulse 80, temperature 98.5 F (36.9 C), resp. rate 18, height 5\' 6"  (1.676 m), weight 85.8 kg, SpO2 94 %.  Constitutional: Labs and vitals reviewed Pt awake, alert, appropriate, standing and doing grooming; needed to sit down half way through, OT as bedside, NAD  HENT:  Head:Normocephalicand atraumatic.  Mouth/Throat:Oropharynx is clear and moist. Nooropharyngeal exudate.  Eyes:EOMare normal.   Neck:Normal range of motion..  Cardiovascular:Normal rateand regular rhythm.  Respiratory:Effort normal. Norespiratory distress. He hasno wheezes. He hasno rales.  . He exhibitsno distension. There isno abdominal tenderness.  Musculoskeletal:Normal range of motion.  General: No edema.  Comments: Low back TTP Neurological: He is alertand oriented to person, place, and time. Nocranial nerve deficit. Normal insight and awareness. Motor 5/5 UE and 4/5 LE prox to distal. No sensory findings. Skin: He isnot diaphoretic. Back incision clean and dry with Steri-Strips in place, wound well-approximated-no erythema; no drainage- looks great Psychiatric: He has a normal mood and affect. Hisbehavior is normal.     Assessment/Plan: 1. Functional deficits secondary to lumbar radiculopathy/ to  lumbar epidural hematoma status post removal of epidural hematoma L2 S1 09/20/2019 after decompressive lumbar laminectomy and foraminotomies L2-3, 3-4, L4-5, L5-S1which require 3+ hours per day of interdisciplinary therapy in a comprehensive inpatient rehab setting.  Physiatrist is providing close team supervision and 24 hour management of active medical problems listed below.  Physiatrist and rehab team continue to assess barriers to discharge/monitor patient progress toward functional and medical goals  Care Tool:  Bathing    Body parts bathed by patient: Right arm, Left arm, Chest, Abdomen, Front perineal area, Buttocks, Right upper leg, Left upper leg, Right lower leg, Left lower leg, Face         Bathing assist Assist Level: Contact Guard/Touching assist     Upper Body Dressing/Undressing Upper body dressing   What is the patient wearing?: Pull over shirt    Upper body assist Assist Level: Supervision/Verbal cueing    Lower Body Dressing/Undressing Lower body dressing      What is the patient wearing?: Pants     Lower body assist Assist for lower body dressing: Supervision/Verbal cueing     Toileting Toileting    Toileting assist Assist for toileting: Minimal Assistance - Patient > 75%     Transfers Chair/bed transfer  Transfers assist     Chair/bed transfer assist level: Contact Guard/Touching assist     Locomotion Ambulation   Ambulation assist      Assist level: Supervision/Verbal cueing Assistive device: Walker-rolling Max distance: 150'   Walk 10 feet activity   Assist     Assist level: Supervision/Verbal cueing Assistive device: Walker-rolling   Walk 50 feet activity  Assist    Assist level: Supervision/Verbal cueing Assistive device: Walker-rolling    Walk 150 feet activity   Assist    Assist level: Supervision/Verbal cueing Assistive device: Walker-rolling    Walk 10 feet on uneven surface  activity   Assist Walk  10 feet on uneven surfaces activity did not occur: Safety/medical concerns(recent spinal surgery x 2)         Wheelchair     Assist   Type of Wheelchair: Manual    Wheelchair assist level: Supervision/Verbal cueing Max wheelchair distance: 150    Wheelchair 50 feet with 2 turns activity    Assist        Assist Level: Supervision/Verbal cueing   Wheelchair 150 feet activity     Assist      Assist Level: Supervision/Verbal cueing   Blood pressure 111/62, pulse 80, temperature 98.5 F (36.9 C), resp. rate 18, height 5\' 6"  (1.676 m), weight 85.8 kg, SpO2 94 %.  Medical Problem List and Plan: 1.Decreased functional mobilitysecondary to lumbar epidural hematoma status post removal of epidural hematoma L2 S1 09/20/2019 after decompressive lumbar laminectomy and foraminotomies L2-3, 3-4, L4-5, L5-S1 09/16/2019. No back brace required -admit to inpatient rehab             -ELOS 7 days, Mod I goals  10/1- will order Kpad per pt request; pt d/c is scheduled for Sunday, at this point. 2. Antithrombotics: -DVT/anticoagulation:SCD.               -dopplers not need, pt ambulatory -antiplatelet therapy: N/A 3. Pain Management:Oxycodone and Flexeril as needed 4. Mood:Klonopin 0.5 mg twice daily, trazodone 200 mg nightly as needed -antipsychotic agents: N/A 5. Neuropsych: This patientiscapable of making decisions on hisown behalf. 6. Skin/Wound Care:Routine skin checks 7. Fluids/Electrolytes/Nutrition:Routine in and outs with follow-up chemistries 8. Hypertension. Norvasc 5 mg daily, Lasix 80 mg twice daily, Lopressor 100 mg twice daily -monitor bp's with activity 9. Urinary retention. Remove foley at discharge -begin voiding trial, check pvr's -I/O caths prn. Was apparently I/O cathing at home 10. Rheumatoid arthritis. 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 Creatinine  baseline 1.29-1.72. Continue Lasix as prior to admission  9/30- Cr 1.56- but BUN has jumped to 57 from high 30s- will ask him to drink more- recheck Friday-  12. History of gout. Colchicine 0.6 mg daily and Zyloprim 300 mg daily.  -no flare ups at present 13. Diabetes mellitus. Hemoglobin A1c 8.1. SSI. With cbg's tid/ac&hs -monitor for trends CBG (last 3)  Recent Labs    09/25/19 1653 09/25/19 2103 09/26/19 0620  GLUCAP 218* 266* 237*   9/30- BGs elevated- will monitor and make changes tomorrow if still elevated/need a trend.  14. Constipation. Laxative assistance 15. Leukopenia  9/30- will recheck Friday- no Sx's of illness     LOS: 2 days A FACE TO FACE EVALUATION WAS PERFORMED  Marisel Tostenson 09/26/2019, 8:53 AM

## 2019-09-26 NOTE — Progress Notes (Signed)
Physical Therapy Session Note  Patient Details  Name: Jerry York MRN: [id number] Date of Birth: [birth year]  Today's Date: 09/26/2019 PT Individual Time: 1130-1200 PT Individual Time Calculation (min): 30 min   Short Term Goals: Week 1:  PT Short Term Goal 1 (Week 1): = LTGs due to ELOS  Skilled Therapeutic Interventions/Progress Updates:   Pt received sitting in WC and agreeable to PT. Gait training to rehab gym with RW x 181f and supervision assist, min cues for posture and AD management throughout. Pt instructed in dynamic balance training to perform ball toss to eye level as well as off trampoline 2 x 15 each with CGA-supervision assist for safety and use of ankle stragey to prevent posterior LOB. Dynamic gait training to step over 4 hockey sticks x 2 with RW to simulate threshold management at house. Min cues for AD management to posture to prevent LBP. Patient returned to room ambulatory with RW x 1528fand supervision assist for safety. [facility] and all needs met.         Therapy Documentation Precautions:  Precautions Precautions: Fall, Back Precaution Booklet Issued: No Precaution Comments: no LSO Restrictions Weight Bearing Restrictions: No Vital Signs: Therapy Vitals Temp: 97.6 F (36.4 C) Temp Source: Oral Pulse Rate: 70 Resp: 16 BP: (!) 90/53 Patient Position (if appropriate): Lying Oxygen Therapy SpO2: 94 % O2 Device: Room Air Pain: denies   Therapy/Group: Individual Therapy  AuLorie Phenix0/12/2018, 5:43 PM

## 2019-09-26 NOTE — Plan of Care (Signed)
  Problem: Consults Goal: RH SPINAL CORD INJURY PATIENT EDUCATION Description:  See Patient Education module for education specifics.  Outcome: Progressing   Problem: SCI BOWEL ELIMINATION Goal: RH STG MANAGE BOWEL WITH ASSISTANCE Description: STG Manage Bowel with Mod I Assistance. Outcome: Progressing Goal: RH STG SCI MANAGE BOWEL WITH MEDICATION WITH ASSISTANCE Description: STG SCI Manage bowel with medication with Mod I assistance. Outcome: Progressing   Problem: SCI BLADDER ELIMINATION Goal: RH STG MANAGE BLADDER WITH ASSISTANCE Description: STG Manage Bladder With Mod I Assistance Outcome: Progressing Goal: RH STG MANAGE BLADDER WITH MEDICATION WITH ASSISTANCE Description: STG Manage Bladder With Medication With Mod I Assistance. Outcome: Progressing Goal: RH STG MANAGE BLADDER WITH EQUIPMENT WITH ASSISTANCE Description: STG Manage Bladder With Equipment With Min Assistance Outcome: Progressing   Problem: RH SKIN INTEGRITY Goal: RH STG ABLE TO PERFORM INCISION/WOUND CARE W/ASSISTANCE Description: STG Able To Perform Incision/Wound Care With Min Assistance. Outcome: Progressing   Problem: RH PAIN MANAGEMENT Goal: RH STG PAIN MANAGED AT OR BELOW PT'S PAIN GOAL Description: < 4 out of 10.  Outcome: Progressing   

## 2019-09-26 NOTE — Progress Notes (Signed)
Physical Therapy Session Note  Patient Details  Name: MILON DETHLOFF MRN: [id number] Date of Birth: 05/18/1951  Today's Date: 09/26/2019 PT Individual Time: 1300-1330 PT Individual Time Calculation (min): 30 min   Short Term Goals: Week 1:  PT Short Term Goal 1 (Week 1): = LTGs due to ELOS  Skilled Therapeutic Interventions/Progress Updates:    Functional gait on unit x 120' x 2 with overall close supervision with RW and cues for decreased reliance on UE support of RW and decrease forward flexion. 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 BLE strengthening and balance exercises including standing heel raises, standing toe raises, mini squats, and sit <> stands without UE support x 10 reps each. Handout given for HEP also. Pt performed basic transfers at supervision level with RW. End of session returned to bed with supervision and positioned in supine.   Therapy Documentation Precautions:  Precautions Precautions: Fall, Back Precaution Booklet Issued: No Precaution Comments: no LSO Restrictions Weight Bearing Restrictions: No Pain:  Reports minimal back pain. Premedicated.    Therapy/Group: Individual Therapy  Canary Brim Ivory Broad, PT, DPT, CBIS  09/26/2019, 3:02 PM

## 2019-09-26 NOTE — Discharge Summary (Signed)
Physician Discharge Summary  Patient ID: Jerry York MRN: [id number] DOB/AGE: 67-Jan-1952 67 y.o.  Admit date: 09/24/2019 Discharge date: 09/29/2019  Discharge Diagnoses:  Principal Problem:   Epidural hematoma ([place]) Active Problems:   Type 2 diabetes mellitus (HCC)   S/P lumbar laminectomy   Neurogenic bladder DVT prophylaxis Rheumatoid arthritis CKD stage II History of gout Constipation  Discharged Condition: Stable  Significant Diagnostic Studies: Chest 2 View  Result Date: 09/13/2019 CLINICAL DATA:  Spinal stenosis.  Preoperative study. EXAM: CHEST - 2 VIEW COMPARISON:  Chest x-ray 09/05/2018. FINDINGS: Mediastinum hilar structures normal. Stable bibasilar subsegmental atelectasis/scarring. No acute infiltrate. No pleural effusion pneumothorax. Stable cardiomegaly. No pulmonary venous congestion. Degenerative change thoracic spine. IMPRESSION: 1. Stable bibasilar subsegmental atelectasis/scarring. No acute pulmonary abnormality. 2.  Stable cardiomegaly.  No pulmonary venous congestion. Electronically Signed   By: Marcello Moores  Register   On: 09/13/2019 06:18   Mr Lumbar Spine W Wo Contrast  Result Date: 09/20/2019 CLINICAL DATA:  Initial evaluation for worsening back pain with lower extremity weakness, recent surgery. EXAM: MRI LUMBAR SPINE WITHOUT AND WITH CONTRAST TECHNIQUE: Multiplanar and multiecho pulse sequences of the lumbar spine were obtained without and with intravenous contrast. CONTRAST:  98m GADAVIST GADOBUTROL 1 MMOL/ML IV SOLN COMPARISON:  Prior MRI from 07/25/2019. FINDINGS: Segmentation: Standard. Lowest well-formed disc labeled the L5-S1 level. Alignment: Trace anterolisthesis of L5 on S1, stable. Alignment otherwise normal with preservation of the normal lumbar lordosis. Vertebrae: Chronic height loss at the superior endplate of TH73with associated Schmorl's node deformity, stable. Vertebral body height otherwise maintained without acute or interval fracture. Bone  marrow signal intensity mildly heterogeneous but within normal limits. Benign hemangioma noted within the L1 vertebral body. No worrisome osseous lesions. Postoperative changes from recent decompressive laminectomy at L2-3 through L5-S1. No evidence for osteomyelitis discitis. Conus medullaris and cauda equina: Conus extends to the inferior aspect of the L1 level. Conus medullaris within normal limits. Nerve roots of the cauda equina are compressed anteriorly by a posterior intradural or subdural collection extending from L2-3 through L4-5. Collection is positioned at the left posterolateral aspect of the thecal sac, and measures 1.2 x 1.6 x 8.0 cm in greatest dimensions (AP by transverse by craniocaudad). This is positioned at the site of recent laminectomy, and suspicious for a possible CSF leak with subsequent collection. Paraspinal and other soft tissues: Postoperative changes from recent laminectomy present within the posterior paraspinous soft tissues. Small fairly linear T2 hyperintense collection seen along the lower midline incision measures 3.9 x 9.5 cm (series 8, image 10). 4.9 cm simple cyst within the right retroperitoneum likely reflects a partially visualized exophytic right renal cyst. Visualized visceral structures otherwise unremarkable. Disc levels: L1-2: Disc desiccation with mild disc bulge, asymmetric to the right. Mild right worse than left facet hypertrophy. No stenosis. L2-3: Mild disc bulging. Prior posterior decompression. Collection at the posterior thecal sac with secondary compression of the thecal sac anteriorly. Severe stenosis with the thecal sac measuring 4 mm in AP diameter. Residual bilateral facet hypertrophy. Foramina remain patent. L3-4: Diffuse disc bulge. Interval laminectomy. Posterior collection with compression of the thecal sac anteriorly and resultant severe spinal stenosis. Moderate facet hypertrophy with associated joint effusion on the left. Moderate bilateral L3  foraminal stenosis. L4-5: Disc desiccation without disc bulge. Decompressive laminectomy. Collection at the posterior thecal sac with resultant severe stenosis. Residual facet hypertrophy. Foramina remain patent. L5-S1: Trace anterolisthesis. Mild disc bulge. Decompressive laminectomy. 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 No  stenosis. IMPRESSION: Postoperative changes from recent laminectomy at L2-3 through L5-S1 with posterior intradural or subdural collection extending from L2-3 through L4-5, suspicious for a possible CSF leak with subsequent collection. Resultant severe diffuse spinal stenosis with compression of the nerve roots of the cauda equina. Emergent neuro surgical consultation recommended. Critical Value/emergent results were called by telephone at the time of interpretation on 09/20/2019 at 3:23 am to [place] , who verbally acknowledged these results. Electronically Signed   By: Jeannine Boga M.D.   On: 09/20/2019 03:28   Dg Lumbar Spine 1 View  Result Date: 09/16/2019 CLINICAL DATA:  Multilevel severe spinal stenosis. EXAM: LUMBAR SPINE - 1 VIEW COMPARISON:  Radiographs dated 08/13/2019 and lumbar MRI dated 07/25/2019 FINDINGS: Lateral image #1 of the lumbar spine demonstrates an instrument at the L5-S1 level. IMPRESSION: Instrument at L5-S1. Electronically Signed   By: Lorriane Shire M.D.   On: 09/16/2019 12:14    Labs:  Basic Metabolic Panel: Recent Labs  Lab 09/25/19 0458  NA 137  K 3.7  CL 99  CO2 24  GLUCOSE 212*  BUN 57*  CREATININE 1.56*  CALCIUM 8.2*    CBC: Recent Labs  Lab 09/25/19 0458  WBC 3.7*  NEUTROABS 2.2  HGB 12.4*  HCT 35.5*  MCV 88.3  PLT 154    CBG: Recent Labs  Lab 09/25/19 2103 09/26/19 0620 09/26/19 1142 09/26/19 1642 09/26/19 2116  GLUCAP 266* 237* 137* 168* 267*   Family history.  Mother with congestive heart failure, hypertension, hyperlipidemia and diabetes mellitus.  Father with  hypertension, hyperlipidemia and CAD as well as CVA.  Brother with hypertension and diabetes.  Daughter with hypertension.  Son with hypertension.  Denies colon cancer  Brief HPI:   Jerry York is a 68 y.o. right-handed male with history of hypertension, rheumatoid arthritis, hyperlipidemia, type 2 diabetes mellitus, CKD stage II.  Per chart review lives with spouse independent prior to most recent surgery 09/16/2019.  Presented 09/20/2019 after recent decompressive lumbar laminectomy and foraminotomies L2-3 3-4 and 4-5 as well as L5-S1 per Dr. Sherley Bounds.  He was discharged to home ambulating 250 feet minimal guard 09/18/2019.  Presented 09/20/2019 with increasing urinary retention and decreased mobility.  Follow-up imaging showed postoperative lumbar epidural hematoma.  Underwent lumbar reexploration with removal of epidural hematoma L2-S1 09/20/2019 per Dr. Ronnald Ramp.  Hospital course pain management.  No back brace required.  Therapies initiated and patient was admitted for a comprehensive rehab program   Hospital Course: Jerry York was admitted to rehab 09/24/2019 for inpatient therapies to consist of PT, ST and OT at least three hours five days a week. Past admission physiatrist, therapy team and rehab RN have worked together to provide customized collaborative inpatient rehab.  Pertaining to patient lumbar epidural hematoma after recent decompressive lumbar laminectomy surgical site healing nicely neurovascular sensation intact no back brace required he would follow-up neurosurgery Dr. Sherley Bounds.  SCDs for DVT prophylaxis.  Pain management use of oxycodone Flexeril as needed.  Mood stabilization with Klonopin as well as trazodone as prior to admission.  Blood pressures controlled on Norvasc Lasix Lopressor and would follow-up primary MD no orthostasis noted noted bouts of urinary retention Foley catheter tube removed patient had been doing in and out catheterizations as needed at home by patient  report he was voiding much better PVRs improved.  He remained on low back for history of rheumatoid arthritis.  CKD stage II creatinine baseline 1.29-1.72 and monitored.  Latest creatinine 1.56-  1.70.  He did receive a short course of IV fluids.  He can follow-up with his primary MD for following renal function.  History of gout colchicine Zyloprim as needed no gout flareups.  Bouts of constipation resolved with laxative assistance.  History of diabetes mellitus hemoglobin A1c 8.1 patient was using a sliding scale prior to admission.   Blood pressures were monitored on TID basis and remained stable  Diabetes has been monitored with ac/hs CBG checks and SSI was use prn for tighter BS control.   He/ is continent of bowel and bladder.  He/ has made gains during rehab stay and is attending therapies  He/ will continue to receive follow up therapies after discharge  Rehab course: During patient's stay in rehab weekly team conferences were held to monitor patient's progress, set goals and discuss barriers to discharge. At admission, patient required minimal guard ambulate 100 feet rolling walker, minimal assist stand pivot transfers, minimal assist sit to side-lying.  Set up for grooming, supervision upper body bathing, moderate assist lower body bathing, supervision upper body dressing, minimal assist lower body dressing  Physical exam.  Blood pressure 130/59 pulse 52 temperature 98.4 respirations 18 oxygen saturation 94% room air Constitutional.  Alert and oriented no distress HEENT Head.  Normocephalic and atraumatic Mouth and throat.  Oropharynx clear and moist no oropharyngeal exudate Eyes.  Pupils round and reactive to light no nystagmus without discharge Neck.  Supple nontender no JVD no thyromegaly without tracheal deviation Cardiac regular rate rhythm no murmur extra sounds or friction rub Respiratory effort normal no respiratory distress no wheezes no rails GI.  Soft nontender no  distention positive bowel sounds Musculoskeletal normal range of motion Neurological.  No cranial nerve deficits normal insight and awareness motor strength 5 out of 5 upper extremities 4 out of 5 lower extremities proximal to distal no sensory findings Skin.  Incision site clean and dry Steri-Strips in place well approximated  He/  has had improvement in activity tolerance, balance, postural control as well as ability to compensate for deficits. He/ has had improvement in functional use RUE/LUE  and RLE/LLE as well as improvement in awareness.  Ambulating 150 feet rolling walker supervision assist.  Minimal cues for posture.  Patient instructed on dynamic balance training to perform ball toss to eye level as well as off a trampoline contact-guard.  Dynamic gait training to step over for hockey sticks x2 rolling walker to simulate threshold management.  Patient can gather belongings for activities the living and homemaking.  Ambulated throughout the room with supervision to gather belongings grooming task standing at sink.  Patient was advised no driving.  Family teaching completed plan discharge to home       Disposition: Discharge to home   Diet: Diabetic diet  Special Instructions: No driving smoking or alcohol  Medications at discharge 1 Tylenol as needed 2 Zyloprim 100 mg 3 times daily 3 Norvasc 5 mg daily 4 Klonopin 0.5 mg twice daily 5.  Colchicine 0.6 mg daily 6.  Flexeril 5 mg 3 times daily as needed 7.  Lasix 80 mg p.o. twice daily 8.  Mobic 7.5 mg p.o. twice daily 9.  Lopressor 100 mg p.o. twice daily 10.  Oxycodone 5-3 25 1  to 2 tablets every 4 hours as needed pain 11.  MiraLAX daily 12.  Lipitor 40 mg daily 13.  Sliding scale insulin as prior to admission 14.  Trazodone 200 mg nightly as needed    Allergies as of 09/27/2019  Reactions   Prednisone Other (See Comments)   Elevated blood sugar   Sulfa Antibiotics Other (See Comments)   "make my feet burn"    Azithromycin Nausea And Vomiting   Hydrocodone-acetaminophen Itching   Metformin And Related Diarrhea      Medication List    STOP taking these medications   [name] 14 Day Sensor Misc   methocarbamol 500 MG tablet Commonly known as: Robaxin   oxyCODONE-acetaminophen 10-325 MG tablet Commonly known as: Percocet Replaced by: oxyCODONE-acetaminophen 5-325 MG tablet     TAKE these medications   acetaminophen 325 MG tablet Commonly known as: TYLENOL Take 2 tablets (650 mg total) by mouth every 4 (four) hours as needed for mild pain ((score 1 to 3) or temp > 100.5).   allopurinol 100 MG tablet Commonly known as: ZYLOPRIM Take 1 tablet (100 mg total) by mouth 3 (three) times daily. What changed:   how much to take  when to take this   amLODipine 5 MG tablet Commonly known as: NORVASC Take 1 tablet (5 mg total) by mouth daily.   atorvastatin 40 MG tablet Commonly known as: LIPITOR Take 1 tablet (40 mg total) by mouth daily. What changed: when to take this   [name] Take 500 mg by mouth 2 (two) times daily.   clonazePAM 0.5 MG tablet Commonly known as: KLONOPIN Take 1 tablet (0.5 mg total) by mouth 2 (two) times daily.   colchicine 0.6 MG tablet Take 1 tablet (0.6 mg total) by mouth daily. What changed:   how much to take  how to take this  when to take this  additional instructions   cyclobenzaprine 5 MG tablet Commonly known as: FLEXERIL Take 1 tablet (5 mg total) by mouth 3 (three) times daily as needed for muscle spasms.   furosemide 80 MG tablet Commonly known as: LASIX Take 1 tablet (80 mg total) by mouth 2 (two) times daily.   glucose blood test strip Commonly known as: [name] TEST STRIPS Check blood sugars 2-3 times per day. DX: E11.9   glucose monitoring kit monitoring kit 1 each by Does not apply route as needed for other. DX Code: E11.9   HumuLIN R U-500 KwikPen 500 UNIT/ML kwikpen Generic drug: insulin regular human  CONCENTRATED Inject 60-80 Units as directed See admin instructions. INJECT 80 UNITS BEFORE BREAKFAST 60 UNITS BEFORE LUNCH AND 60 UNITS BEFORE EVENING MEAL. 30 MINUTES BEFORE MEALS   insulin lispro 100 UNIT/ML injection Commonly known as: HUMALOG Inject 50-80 Units into the skin See admin instructions. Inject 80 units subcutaneously 30 minutes before breakfast and 50 units before lunch and supper   meloxicam 7.5 MG tablet Commonly known as: MOBIC Take 1 tablet (7.5 mg total) by mouth 2 (two) times daily at 10 AM and 5 PM.   metoprolol tartrate 100 MG tablet Commonly known as: LOPRESSOR Take 1 tablet (100 mg total) by mouth 2 (two) times daily.   oxyCODONE 5 MG immediate release tablet Commonly known as: Oxy IR/ROXICODONE Take 1 tablet (5 mg total) by mouth every 4 (four) hours as needed for moderate pain.   oxyCODONE-acetaminophen 5-325 MG tablet Commonly known as: PERCOCET/ROXICET Take 1-2 tablets by mouth every 4 (four) hours as needed for moderate pain. Replaces: oxyCODONE-acetaminophen 10-325 MG tablet   traZODone 100 MG tablet Commonly known as: DESYREL Take 2 tablets (200 mg total) by mouth at bedtime as needed for sleep. What changed: when to take this      Follow-up Information  Lovorn, Jinny Blossom, MD Follow up.   Specialty: Physical Medicine and Rehabilitation Why: only as needed Contact information: [address] [place] [address] [phone]        Eustace Moore, MD Follow up.   Specialty: Neurosurgery Why: Call for appointment Contact information: [address] [place] [address] [phone]           Signed: Lavon Paganini Newton 09/27/2019, 5:02 AM

## 2019-09-27 ENCOUNTER — Inpatient Hospital Stay (HOSPITAL_COMMUNITY): Payer: PPO | Admitting: Occupational Therapy

## 2019-09-27 ENCOUNTER — Inpatient Hospital Stay (HOSPITAL_COMMUNITY): Payer: PPO | Admitting: Physical Therapy

## 2019-09-27 LAB — CBC WITH DIFFERENTIAL/PLATELET
Abs Immature Granulocytes: 0.11 10*3/uL — ABNORMAL HIGH (ref 0.00–0.07)
Basophils Absolute: 0 10*3/uL (ref 0.0–0.1)
Basophils Relative: 0 %
Eosinophils Absolute: 0.1 10*3/uL (ref 0.0–0.5)
Eosinophils Relative: 1 %
HCT: 38.8 % — ABNORMAL LOW (ref 39.0–52.0)
Hemoglobin: 13.1 g/dL (ref 13.0–17.0)
Immature Granulocytes: 3 %
Lymphocytes Relative: 29 %
Lymphs Abs: 1.3 10*3/uL (ref 0.7–4.0)
MCH: 30.3 pg (ref 26.0–34.0)
MCHC: 33.8 g/dL (ref 30.0–36.0)
MCV: 89.6 fL (ref 80.0–100.0)
Monocytes Absolute: 0.3 10*3/uL (ref 0.1–1.0)
Monocytes Relative: 8 %
Neutro Abs: 2.5 10*3/uL (ref 1.7–7.7)
Neutrophils Relative %: 59 %
Platelets: 176 10*3/uL (ref 150–400)
RBC: 4.33 MIL/uL (ref 4.22–5.81)
RDW: 15.9 % — ABNORMAL HIGH (ref 11.5–15.5)
WBC: 4.3 10*3/uL (ref 4.0–10.5)
nRBC: 0 % (ref 0.0–0.2)

## 2019-09-27 LAB — BASIC METABOLIC PANEL
Anion gap: 12 (ref 5–15)
BUN: 53 mg/dL — ABNORMAL HIGH (ref 8–23)
CO2: 29 mmol/L (ref 22–32)
Calcium: 8.6 mg/dL — ABNORMAL LOW (ref 8.9–10.3)
Chloride: 98 mmol/L (ref 98–111)
Creatinine, Ser: 1.7 mg/dL — ABNORMAL HIGH (ref 0.61–1.24)
GFR calc Af Amer: 47 mL/min — ABNORMAL LOW (ref >60)
GFR calc non Af Amer: 41 mL/min — ABNORMAL LOW (ref >60)
Glucose, Bld: 223 mg/dL — ABNORMAL HIGH (ref 70–99)
Potassium: 4.8 mmol/L (ref 3.5–5.1)
Sodium: 139 mmol/L (ref 135–145)

## 2019-09-27 LAB — GLUCOSE, CAPILLARY
Glucose-Capillary: 199 mg/dL — ABNORMAL HIGH (ref 70–99)
Glucose-Capillary: 155 mg/dL — ABNORMAL HIGH (ref 70–99)
Glucose-Capillary: 182 mg/dL — ABNORMAL HIGH (ref 70–99)
Glucose-Capillary: 237 mg/dL — ABNORMAL HIGH (ref 70–99)

## 2019-09-27 MED ORDER — SODIUM CHLORIDE 0.9 % IV SOLN
INTRAVENOUS | Status: AC
  Administered 2019-09-27 – 2019-09-28 (×2): via INTRAVENOUS

## 2019-09-27 NOTE — Discharge Instructions (Signed)
Inpatient Rehab Discharge Instructions  [facility] Discharge date and time: No discharge date for patient encounter.   Activities/Precautions/ Functional Status: Activity: activity as tolerated Diet: diabetic diet Wound Care: keep wound clean and dry Functional status:  ___ No restrictions     ___ Walk up steps independently ___ 24/7 supervision/assistance   ___ Walk up steps with assistance ___ Intermittent supervision/assistance  ___ Bathe/dress independently ___ Walk with walker     __x_ Bathe/dress with assistance ___ Walk Independently    ___ Shower independently ___ Walk with assistance    ___ Shower with assistance ___ No alcohol     ___ Return to work/school ________  COMMUNITY REFERRALS UPON DISCHARGE:    Outpatient: PT                    Agency:  [facility]   Phone: [phone]                Appointment Date/Time:  10/7 @ 1:15 pm   (please arrive 15 mins prior to appt)     Special Instructions: No driving smoking or alcohol   My questions have been answered and I understand these instructions. I will adhere to these goals and the provided educational materials after my discharge from the hospital.  Patient/Caregiver Signature _______________________________ Date __________  Clinician Signature _______________________________________ Date __________  Please bring this form and your medication list with you to all your follow-up doctor's appointments.

## 2019-09-27 NOTE — Progress Notes (Signed)
Occupational Therapy Session Note  Patient Details  Name: Jerry York MRN: [id number] Date of Birth: August 29, 1951  Today's Date: 09/27/2019 OT Individual Time: 4332-9518 and 8416-6063 OT Individual Time Calculation (min): 75 min and 42 min OT Missed Time: 18 min (pt fatigue/pain)   Short Term Goals: Week 1:  OT Short Term Goal 1 (Week 1): STG=LTG due to LOS  Skilled Therapeutic Interventions/Progress Updates:    Session One: Pt seen for OT session focusing on functional mobility, IADL re-training, rollator training and LE strengthening. Pt asleep in supine upon arrival, easily awoken and agreeable to tx session. Voiced stiff ness in back, up to date on pain meds and denying need for intervention.  Introduction and demonstration provided regarding use of rollator including brake management and management technique in functional contexts. Pt ambulated throughout room and unit using rollator with supervision. Pt with good carryover of education and brake management throughout ambulation and during seated rest breaks on rollator and when turning to sit in standard chair. Added #5 weight to rollator for increased control/stability. In ADL apartment, completed simulated simple meal prep, boiling pot of water as pt makes scrambled eggs daily at stove level. He retrieved items from refrigerator, low cabinets and overhead cabinets. He required VCs for adherence to back pre-cautions in functional context. Education and return demonstration with safe techniques to transport items in kitchen while maintaining control of AD.  Returned to therapy gym and transitioned to supine on mat mod I. Completed supine there-ex as follows, x2 sets of 12 each. Multi-modal cuing for proper form and technique and adherence to   Heel slides R/L  Hip ABduction  Sid-lying "clam shells" R/L  Pt returned to room at end of session using rollator with supervision, left with all needs in reach in supine with bed alarm on. .  Pt  voiced liking the rollator, however, not sure if insurance will cover as they paid for RW ~2 years ago. CSW made aware and to look into it.   Session Two: Pt seen for OT session focusing on functional activity tolerance, dynamic balance, and functional transfers. Pt sitting EOB upon arrival, agreeable to tx session. Pt with complaints of back stiffness throughout session, RN made aware and administered pain medications during session. Pt limited by pain this session and session terminated early as a result.  CSW informed pt he would have to private pay for rollator, therefore pt choosing to cont to use standard RW which he already owns.  He ambulated throughout unit with RW and supervision, VCs for upright posture.  In therapy gym, completed dynamic sitting then standing balance activity standing to bounce ball off trampoline. Min A required for balance. Pt tolerating ~1 minute in standing before requiring seated rest break. Attempted to grade up activity when pt reached supervision level with task, however, pt unable to tolerate 2/2 back pain. In ADL apartment, completed simulated shower stall transfer as pt has at home. Pt unable to recall haivng practiced this in previous tx session and required re-education on technique for shower stall using RW. Completed with supervision following education. Pt desiring to end session at this point due to pain. Returned to room and transitioned to supine mod I. Ice pack provided to lower back for comfort, all needs in reach and bed alarm on.  Pt very flat throughout session, attempted discussion regarding return to meaningful occupations. Pt reports all he does is watch TV and mow the grass, not open to any further discussion or education.  Therapy Documentation Precautions:  Precautions Precautions: Fall, Back Precaution Booklet Issued: No Precaution Comments: no LSO Restrictions Weight Bearing Restrictions: No   Therapy/Group: Individual  Therapy  Teasha Murrillo L 09/27/2019, 6:38 AM

## 2019-09-27 NOTE — Progress Notes (Signed)
[place] PHYSICAL MEDICINE & REHABILITATION PROGRESS NOTE   Subjective/Complaints: Pt reports no issues- asking for a rollator, but has to check if insurance will pay for it.  ROS: Pt denies CP, SOB, N/V/D.   Objective:   No results found. Recent Labs    09/25/19 0458 09/27/19 0615  WBC 3.7* 4.3  HGB 12.4* 13.1  HCT 35.5* 38.8*  PLT 154 176   Recent Labs    09/25/19 0458 09/27/19 0615  NA 137 139  K 3.7 4.8  CL 99 98  CO2 24 29  GLUCOSE 212* 223*  BUN 57* 53*  CREATININE 1.56* 1.70*  CALCIUM 8.2* 8.6*    Intake/Output Summary (Last 24 hours) at 09/27/2019 0832 Last data filed at 09/26/2019 1700 Gross per 24 hour  Intake 300 ml  Output -  Net 300 ml     Physical Exam: Vital Signs Blood pressure 133/86, pulse 65, temperature 98 F (36.7 C), resp. rate 18, height 5\' 6"  (1.676 m), weight 85.8 kg, SpO2 96 %.  Constitutional: Labs and vitals reviewed Pt awake, alert, laying on mat in gym; NAD  HENT:  Head:Normocephalicand atraumatic.  Mouth/Throat:Oropharynx is clear and moist. Nooropharyngeal exudate.  Eyes:EOMare normal.   Neck:Normal range of motion..  Cardiovascular:Normal rateand regular rhythm. Respiratory:Effort normal. Norespiratory distress. He hasno wheezes. He hasno rales.  GU:YQIH. He exhibitsno distension. There isno abdominal tenderness.  Musculoskeletal:Normal range of motion.  General: No edema.  Comments: Low back TTP Neurological: He is alertand oriented to person, place, and time. Nocranial nerve deficit. Normal insight and awareness. Motor 5/5 UE and 4/5 LE prox to distal. No sensory findings. Skin: He isnot diaphoretic. Back incision clean and dry with Steri-Strips in place, wound well-approximated-no erythema; no drainage- looks great Psychiatric: He has a normal mood and affect. Hisbehavior is normal.     Assessment/Plan: 1. Functional deficits secondary to lumbar radiculopathy/ to lumbar  epidural hematoma status post removal of epidural hematoma L2 S1 09/20/2019 after decompressive lumbar laminectomy and foraminotomies L2-3, 3-4, L4-5, L5-S1which require 3+ hours per day of interdisciplinary therapy in a comprehensive inpatient rehab setting.  Physiatrist is providing close team supervision and 24 hour management of active medical problems listed below.  Physiatrist and rehab team continue to assess barriers to discharge/monitor patient progress toward functional and medical goals  Care Tool:  Bathing    Body parts bathed by patient: Right arm, Left arm, Chest, Abdomen, Front perineal area, Buttocks, Right upper leg, Left upper leg, Right lower leg, Left lower leg, Face         Bathing assist Assist Level: Independent with assistive device Assistive Device Comment: Seated on shower chair   Upper Body Dressing/Undressing Upper body dressing   What is the patient wearing?: Pull over shirt    Upper body assist Assist Level: Independent    Lower Body Dressing/Undressing Lower body dressing      What is the patient wearing?: Pants     Lower body assist Assist for lower body dressing: Independent     Toileting Toileting    Toileting assist Assist for toileting: Independent with assistive device     Transfers Chair/bed transfer  Transfers assist     Chair/bed transfer assist level: Independent with assistive device     Locomotion Ambulation   Ambulation assist      Assist level: Supervision/Verbal cueing Assistive device: Walker-rolling Max distance: 120'   Walk 10 feet activity   Assist     Assist level: Supervision/Verbal cueing Assistive device: Walker-rolling  Walk 50 feet activity   Assist    Assist level: Supervision/Verbal cueing Assistive device: Walker-rolling    Walk 150 feet activity   Assist    Assist level: Supervision/Verbal cueing Assistive device: Walker-rolling    Walk 10 feet on uneven surface   activity   Assist Walk 10 feet on uneven surfaces activity did not occur: Safety/medical concerns(recent spinal surgery x 2)         Wheelchair     Assist   Type of Wheelchair: Manual    Wheelchair assist level: Supervision/Verbal cueing Max wheelchair distance: 150    Wheelchair 50 feet with 2 turns activity    Assist        Assist Level: Supervision/Verbal cueing   Wheelchair 150 feet activity     Assist      Assist Level: Supervision/Verbal cueing   Blood pressure 133/86, pulse 65, temperature 98 F (36.7 C), resp. rate 18, height 5\' 6"  (1.676 m), weight 85.8 kg, SpO2 96 %.  Medical Problem List and Plan: 1.Decreased functional mobilitysecondary to lumbar epidural hematoma status post removal of epidural hematoma L2 S1 09/20/2019 after decompressive lumbar laminectomy and foraminotomies L2-3, 3-4, L4-5, L5-S1 09/16/2019. No back brace required -admit to inpatient rehab             -ELOS 7 days, Mod I goals  10/1- will order Kpad per pt request; pt d/c is scheduled for Sunday, at this point.  10/2- wants a rollator- PT looking into it. 2. Antithrombotics: -DVT/anticoagulation:SCD.               -dopplers not need, pt ambulatory -antiplatelet therapy: N/A 3. Pain Management:Oxycodone and Flexeril as needed 4. Mood:Klonopin 0.5 mg twice daily, trazodone 200 mg nightly as needed -antipsychotic agents: N/A 5. Neuropsych: This patientiscapable of making decisions on hisown behalf. 6. Skin/Wound Care:Routine skin checks 7. Fluids/Electrolytes/Nutrition:Routine in and outs with follow-up chemistries 8. Hypertension. Norvasc 5 mg daily, Lasix 80 mg twice daily, Lopressor 100 mg twice daily -monitor bp's with activity 9. Urinary retention. Remove foley at discharge -begin voiding trial, check pvr's -I/O caths prn. Was apparently I/O cathing at home 10. Rheumatoid  arthritis. 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 Creatinine baseline 1.29-1.72. Continue Lasix as prior to admission  9/30- Cr 1.56- but BUN has jumped to 57 from high 30s- will ask him to drink more- recheck Friday-  10/2- BUN 53 and Cr 1.70- Cr baseline; BUN still up.   12. History of gout. Colchicine 0.6 mg daily and Zyloprim 300 mg daily.  -no flare ups at present 13. Diabetes mellitus. Hemoglobin A1c 8.1. SSI. With cbg's tid/ac&hs -monitor for trends CBG (last 3)  Recent Labs    09/26/19 1642 09/26/19 2116 09/27/19 0611  GLUCAP 168* 267* 199*   9/30- BGs elevated- will monitor and make changes tomorrow if still elevated/need a trend.  14. Constipation. Laxative assistance 15. Leukopenia  9/30- will recheck Friday- no Sx's of illness  10/2- resolved     LOS: 3 days A FACE TO FACE EVALUATION WAS PERFORMED  Jerry York 09/27/2019, 8:32 AM

## 2019-09-27 NOTE — IPOC Note (Signed)
Overall Plan of Care [facility]) Patient Details Name: Jerry York MRN: [id number] DOB: May 06, 1951  Admitting Diagnosis: Epidural hematoma [facility])  Hospital Problems: Principal Problem:   Epidural hematoma ([place]) Active Problems:   Type 2 diabetes mellitus ([place])   S/P lumbar laminectomy   Neurogenic bladder     Functional Problem List: Nursing Bladder, Bowel, Pain, Motor, Skin Integrity  PT Balance, Endurance, Motor, Pain, Safety  OT Balance, Endurance, Pain, Safety  SLP    TR         Basic ADL's: OT Grooming, Bathing, Dressing, Toileting     Advanced  ADL's: OT       Transfers: PT Bed Mobility, Bed to Chair, Car, Manufacturing systems engineer, Metallurgist: PT Ambulation, Stairs     Additional Impairments: OT None  SLP        TR      Anticipated Outcomes Item Anticipated Outcome  Self Feeding Indep  Swallowing      Basic self-care  Mod I  Toileting  Mod I   Bathroom Transfers Supervision-mod I  Bowel/Bladder  Pt will manage bowel and bladder with mod I assist  Transfers  modified independent basic and car tr  Locomotion  modified independent gait x 150' level tile, carpet, up/down ramp; S up/down 12 steps 2 rails for community  Communication     Cognition     Pain  Pt will manage pain at 3 or less on a scale of 0-10.  Safety/Judgment  Pt will remain free of falls and injury with min assist   Therapy Plan: PT Intensity: Minimum of 1-2 x/day ,45 to 90 minutes PT Frequency: 5 out of 7 days PT Duration Estimated Length of Stay: 5-7 OT Intensity: Minimum of 1-2 x/day, 45 to 90 minutes OT Frequency: 5 out of 7 days OT Duration/Estimated Length of Stay: 4-6 days     Due to the current state of emergency, patients may not be receiving their 3-hours of Medicare-mandated therapy.   Team Interventions: Nursing Interventions Patient/Family Education, Bladder Management, Bowel Management, Pain Management, Skin Care/Wound Management, Discharge  Planning  PT interventions Ambulation/gait training, Discharge planning, Functional mobility training, Therapeutic Activities, Balance/vestibular training, Neuromuscular re-education, Therapeutic Exercise, Wheelchair propulsion/positioning, DME/adaptive equipment instruction, Pain management, Splinting/orthotics, UE/LE Strength taining/ROM, Community reintegration, Technical sales engineer stimulation, Patient/family education, IT trainer, UE/LE Coordination activities  OT Interventions Training and development officer, Engineer, drilling, Patient/family education, Therapeutic Activities, Therapeutic Exercise, Community reintegration, Functional mobility training, Self Care/advanced ADL retraining, UE/LE Strength taining/ROM, UE/LE Coordination activities, Neuromuscular re-education, Discharge planning, Pain management, Splinting/orthotics  SLP Interventions    TR Interventions    SW/CM Interventions Discharge Planning, Psychosocial Support, Patient/Family Education   Barriers to Discharge MD  Medical stability, Wound care and Medication compliance  Nursing Wound Care    PT      OT      SLP      SW       Team Discharge Planning: Destination: PT-Home ,OT- Home , SLP-  Projected Follow-up: PT-Outpatient PT, OT-  Home health OT, SLP-  Projected Equipment Needs: PT- , OT- To be determined, SLP-  Equipment Details: PT-owns a RW, OT-  Patient/family involved in discharge planning: PT- Patient, Family member/caregiver,  OT-Patient, SLP-   MD ELOS: 5-7 days Medical Rehab Prognosis:  Excellent Assessment:  Pt is a 68 yr old male with epidural hematoma s/p L2-S1 epidural hematoma removal and lumbar lami L2-S1 Has CKD Stage III, HTN, urinary retention, and RA Cr running high  with BUN also running high- will give IVFs, and monitor.  Goals Supervision to mod I at d/c   See Team Conference Notes for weekly updates to the plan of care

## 2019-09-27 NOTE — Progress Notes (Signed)
Physical Therapy Session Note  Patient Details  Name: Jerry York MRN: [id number] Date of Birth: May 22, 1951  Today's Date: 09/27/2019 PT Individual Time: 0945-1100 PT Individual Time Calculation (min): 75 min   Short Term Goals: Week 1:  PT Short Term Goal 1 (Week 1): = LTGs due to ELOS  Skilled Therapeutic Interventions/Progress Updates:   Pt received seated in bed, agreeable to PT session. Pt reports pain is well-controlled this AM. Bed mobility Supervision. Pt is able to don shoes with setup A while seated EOB. Sit to stand with Supervision to rollator. Ambulation x 150', x 200' with rollator and Supervision. Pt demos good control of rollator during gait as well as good safety awareness and brake management with transfers. Ascend/descend 12 stairs with 2 handrails and Supervision. Dynamic standing balance: ambulation through obstacle course weaving through cones and navigating tight spaces to simulate home environment. Nustep level 4 x 10 min with use of B UE/LE for global endurance training. Pt asking about obtaining a scooter for mobility in the community. Education with patient about endurance and encouraged him to continue gait with rollator as it allows him to take seated rest breaks as needed but will allow him to continue to improve endurance vs being dependent on a scooter. Also encouraged pt to use scooters if available at the grocery store, zoo, etc. as needed for significant distances but otherwise to continue gait with rollator. Standing balance and endurance x 15 min with no UE support while performing [employer]. Pt demos good balance with overall Supervision assist required with one instance of CGA required. Pt also exhibits good endurance for standing but does have onset of RLE weakness and some knee buckling with fatigue. Pt left seated in bed with needs in reach, bed alarm in place at end of session.  Therapy Documentation Precautions:  Precautions Precautions: Fall,  Back Precaution Booklet Issued: No Precaution Comments: no LSO Restrictions Weight Bearing Restrictions: No    Therapy/Group: Individual Therapy  Excell Seltzer, PT, DPT  09/27/2019, 3:25 PM

## 2019-09-27 NOTE — Plan of Care (Signed)
  Problem: Consults Goal: RH SPINAL CORD INJURY PATIENT EDUCATION Description:  See Patient Education module for education specifics.  Outcome: Progressing   Problem: SCI BOWEL ELIMINATION Goal: RH STG MANAGE BOWEL WITH ASSISTANCE Description: STG Manage Bowel with Mod I Assistance. Outcome: Progressing Goal: RH STG SCI MANAGE BOWEL WITH MEDICATION WITH ASSISTANCE Description: STG SCI Manage bowel with medication with Mod I assistance. Outcome: Progressing   Problem: SCI BLADDER ELIMINATION Goal: RH STG MANAGE BLADDER WITH ASSISTANCE Description: STG Manage Bladder With Mod I Assistance Outcome: Progressing Goal: RH STG MANAGE BLADDER WITH MEDICATION WITH ASSISTANCE Description: STG Manage Bladder With Medication With Mod I Assistance. Outcome: Progressing Goal: RH STG MANAGE BLADDER WITH EQUIPMENT WITH ASSISTANCE Description: STG Manage Bladder With Equipment With Min Assistance Outcome: Progressing   Problem: RH SKIN INTEGRITY Goal: RH STG ABLE TO PERFORM INCISION/WOUND CARE W/ASSISTANCE Description: STG Able To Perform Incision/Wound Care With [employer]. Outcome: Progressing   Problem: RH PAIN MANAGEMENT Goal: RH STG PAIN MANAGED AT OR BELOW PT'S PAIN GOAL Description: < 4 out of 10.  Outcome: Progressing

## 2019-09-27 NOTE — Progress Notes (Signed)
Occupational Therapy Discharge Summary  Patient Details  Name: Jerry York MRN: [id number] Date of Birth: Feb 27, 1951   Patient has met 10 of 10 long term goals due to improved activity tolerance, improved balance, postural control and improved coordination.  Patient to discharge at overall Modified Independent level using RW.  Patient's wife is available to provide any PRN assist pt may require. No formal caregiver training completed 2/2 mod I goals.Recommend use of shower chair for seated bathing level.    Recommendation:  Patient with no further OT needs. Will receive follow-up  OPPT  Equipment: No equipment provided; pt has built in shower chair  Reasons for discharge: treatment goals met and discharge from hospital  Patient/family agrees with progress made and goals achieved: Yes  OT Discharge Precautions/Restrictions  Precautions Precautions: Fall;Back Precaution Booklet Issued: No Precaution Comments: no LSO Restrictions Weight Bearing Restrictions: No Vision Baseline Vision/History: Wears glasses Wears Glasses: At all times Patient Visual Report: No change from baseline Vision Assessment?: No apparent visual deficits Perception  Perception: Within Functional Limits Praxis Praxis: Intact Cognition Overall Cognitive Status: No family/caregiver present to determine baseline cognitive functioning Arousal/Alertness: Awake/alert Orientation Level: Oriented X4 Memory: Impaired Memory Impairment: Decreased recall of new information;Decreased short term memory Decreased Short Term Memory: Verbal complex;Functional complex Awareness: Impaired Awareness Impairment: Anticipatory impairment Problem Solving: Impaired Problem Solving Impairment: Verbal complex;Functional complex Behaviors: Impulsive Safety/Judgment: Appears intact Sensation Sensation Light Touch: Appears Intact Proprioception: Appears Intact Coordination Gross Motor Movements are Fluid and  Coordinated: Yes Fine Motor Movements are Fluid and Coordinated: Yes Motor  Motor Motor: Within Functional Limits Trunk/Postural Assessment  Cervical Assessment Cervical Assessment: Within Functional Limits Thoracic Assessment Thoracic Assessment: Within Functional Limits Lumbar Assessment Lumbar Assessment: Exceptions to WFL(Back pre-cautions) Postural Control Postural Control: Within Functional Limits  Balance Balance Balance Assessed: Yes Static Sitting Balance Static Sitting - Balance Support: Feet supported Static Sitting - Level of Assistance: 7: Independent Dynamic Sitting Balance Dynamic Sitting - Balance Support: During functional activity;Feet supported Dynamic Sitting - Level of Assistance: 6: Modified independent (Device/Increase time) Sitting balance - Comments: Sitting on shower chair to bathe. Static Standing Balance Static Standing - Balance Support: During functional activity Static Standing - Level of Assistance: 6: Modified independent (Device/Increase time) Dynamic Standing Balance Dynamic Standing - Balance Support: During functional activity Dynamic Standing - Level of Assistance: 6: Modified independent (Device/Increase time) Dynamic Standing - Comments: Standing to complete LB clothing management Extremity/Trunk Assessment RUE Assessment RUE Assessment: Exceptions to [facility] Active Range of Motion (AROM) Comments: ~100 degrees shoulder flexion with discomfort. All other major muscle groups [facility] General Strength Comments: elbow flexion/extention 5/5 RUE Body System: Ortho RUE AROM (degrees) RUE Overall AROM Comments: hx R rotator cuff surgery with impaired shoulder flexion LUE Assessment LUE Assessment: Exceptions to [facility] Active Range of Motion (AROM) Comments: ~100 degrees shoulder flexion with discomfort. All other major muscle groups [facility] General Strength Comments: elbow flexion/etention 5/5 LUE Body System: Ortho LUE AROM (degrees) Overall AROM Left  Upper Extremity: Deficits;Due to premorbid status LUE Overall AROM Comments: hx of shoulder/rotator cuff injury- no surgery   Rounds, Amy L 09/27/2019, 7:24 AM

## 2019-09-28 ENCOUNTER — Inpatient Hospital Stay (HOSPITAL_COMMUNITY): Payer: PPO | Admitting: Physical Therapy

## 2019-09-28 ENCOUNTER — Inpatient Hospital Stay (HOSPITAL_COMMUNITY): Payer: PPO

## 2019-09-28 ENCOUNTER — Inpatient Hospital Stay (HOSPITAL_COMMUNITY): Payer: PPO | Admitting: Occupational Therapy

## 2019-09-28 LAB — BASIC METABOLIC PANEL
Anion gap: 10 (ref 5–15)
BUN: 52 mg/dL — ABNORMAL HIGH (ref 8–23)
CO2: 27 mmol/L (ref 22–32)
Calcium: 8.5 mg/dL — ABNORMAL LOW (ref 8.9–10.3)
Chloride: 102 mmol/L (ref 98–111)
Creatinine, Ser: 1.62 mg/dL — ABNORMAL HIGH (ref 0.61–1.24)
GFR calc Af Amer: 50 mL/min — ABNORMAL LOW (ref >60)
GFR calc non Af Amer: 43 mL/min — ABNORMAL LOW (ref >60)
Glucose, Bld: 189 mg/dL — ABNORMAL HIGH (ref 70–99)
Potassium: 3.9 mmol/L (ref 3.5–5.1)
Sodium: 139 mmol/L (ref 135–145)

## 2019-09-28 LAB — GLUCOSE, CAPILLARY
Glucose-Capillary: 184 mg/dL — ABNORMAL HIGH (ref 70–99)
Glucose-Capillary: 141 mg/dL — ABNORMAL HIGH (ref 70–99)
Glucose-Capillary: 127 mg/dL — ABNORMAL HIGH (ref 70–99)
Glucose-Capillary: 189 mg/dL — ABNORMAL HIGH (ref 70–99)

## 2019-09-28 NOTE — Progress Notes (Signed)
Occupational Therapy Session Note  Patient Details  Name: Jerry York MRN: [id number] Date of Birth: 1951/06/05  Today's Date: 09/28/2019 OT Individual Time: 1445-1521 OT Individual Time Calculation (min): 36 min    Short Term Goals: Week 1:  OT Short Term Goal 1 (Week 1): STG=LTG due to LOS  Skilled Therapeutic Interventions/Progress Updates:    Pt recevied supine in bed agreeable to session. Pt reporting 6/10 pain in his back, OOB mobility and ambulation used as pain management. Pt completed 250 ft of functional mobility to the dayroom with his RW. Pt edu on being mod I in his room and a sign obtained to signal this change to other staff members. Nustep used as preparatory method and to attempt and alleviate some back pain pt describing. Nustep set at level 4 resistance and pt encouraged to increase steps per minute to >30. Pt then used RW to complete functional mobility to the therapy gym. He used a 2 lb dowel to complete brief BUE strengthening circuit. Pt edu on importance of maintaining exercise for function. Pt returned to room, c/o fatigue. Pt left supine with all needs met.   Therapy Documentation Precautions:  Precautions Precautions: Fall, Back Precaution Booklet Issued: No(reviewed BLT) Precaution Comments: no LSO Restrictions Weight Bearing Restrictions: No   Therapy/Group: Individual Therapy  Curtis Sites 09/28/2019, 3:03 PM

## 2019-09-28 NOTE — Progress Notes (Signed)
[place] PHYSICAL MEDICINE & REHABILITATION PROGRESS NOTE   Subjective/Complaints: Pt reports has been drinking water for [name]- had a few 1/2 full water bottles on bedside table.  Also s/p IVFs x 24 hours- has finished.   Otherwise denies any issues.   BUN stable at 52 and Cr improved at 1.62 after IVFs/PO push fluids.  ROS: Pt denies CP, SOB, N/V/D.   Objective:   No results found. Recent Labs    09/27/19 0615  WBC 4.3  HGB 13.1  HCT 38.8*  PLT 176   Recent Labs    09/27/19 0615 09/28/19 0554  NA 139 139  K 4.8 3.9  CL 98 102  CO2 29 27  GLUCOSE 223* 189*  BUN 53* 52*  CREATININE 1.70* 1.62*  CALCIUM 8.6* 8.5*    Intake/Output Summary (Last 24 hours) at 09/28/2019 1118 Last data filed at 09/28/2019 0747 Gross per 24 hour  Intake 1612.12 ml  Output -  Net 1612.12 ml     Physical Exam: Vital Signs Blood pressure 120/70, pulse (!) 58, temperature 98.5 F (36.9 C), temperature source Oral, resp. rate 18, height 5\' 6"  (1.676 m), weight 85.8 kg, SpO2 94 %.  Constitutional: Labs and vitals reviewed Pt awake, alert, appropriate, sitting up in bed, in room, watching TV, NAD  HENT:  Head:Normocephalicand atraumatic.  Mouth/Throat:Oropharynx is clear and moist. Nooropharyngeal exudate.  Eyes:EOMare normal.   Neck:Normal range of motion..  Cardiovascular:Normal rateand regular rhythm. Respiratory:Effort normal. Norespiratory distress. He hasno wheezes. He hasno rales.  . He exhibitsno distension. There isno abdominal tenderness.  Musculoskeletal:Normal range of motion.  General: No edema.  Comments: Low back TTP Neurological: He is alertand oriented to person, place, and time. Nocranial nerve deficit. Normal insight and awareness. Motor 5/5 UE and 4/5 LE prox to distal. No sensory findings. Skin: He isnot diaphoretic. Back incision clean and dry with Steri-Strips in place, wound well-approximated-no erythema; no  drainage- looks great Psychiatric: He has a normal mood and affect. Hisbehavior is normal.     Assessment/Plan: 1. Functional deficits secondary to lumbar radiculopathy/ to lumbar epidural hematoma status post removal of epidural hematoma L2 S1 09/20/2019 after decompressive lumbar laminectomy and foraminotomies L2-3, 3-4, L4-5, L5-S1which require 3+ hours per day of interdisciplinary therapy in a comprehensive inpatient rehab setting.  Physiatrist is providing close team supervision and 24 hour management of active medical problems listed below.  Physiatrist and rehab team continue to assess barriers to discharge/monitor patient progress toward functional and medical goals  Care Tool:  Bathing    Body parts bathed by patient: Right arm, Left arm, Chest, Abdomen, Front perineal area, Buttocks, Right upper leg, Left upper leg, Right lower leg, Left lower leg, Face         Bathing assist Assist Level: Independent with assistive device Assistive Device Comment: Seated on shower chair   Upper Body Dressing/Undressing Upper body dressing   What is the patient wearing?: Pull over shirt    Upper body assist Assist Level: Independent    Lower Body Dressing/Undressing Lower body dressing      What is the patient wearing?: Pants     Lower body assist Assist for lower body dressing: Independent     Toileting Toileting    Toileting assist Assist for toileting: Independent with assistive device     Transfers Chair/bed transfer  Transfers assist     Chair/bed transfer assist level: Supervision/Verbal cueing Chair/bed transfer assistive device: 09/22/2019   Ambulation assist  Assist level: Supervision/Verbal cueing Assistive device: Rollator Max distance: 200'   Walk 10 feet activity   Assist     Assist level: Supervision/Verbal cueing Assistive device: Rollator   Walk 50 feet activity   Assist    Assist level:  Supervision/Verbal cueing Assistive device: Rollator    Walk 150 feet activity   Assist    Assist level: Supervision/Verbal cueing Assistive device: Rollator    Walk 10 feet on uneven surface  activity   Assist Walk 10 feet on uneven surfaces activity did not occur: Safety/medical concerns(recent spinal surgery x 2)         Wheelchair     Assist   Type of Wheelchair: Manual    Wheelchair assist level: Supervision/Verbal cueing Max wheelchair distance: 150    Wheelchair 50 feet with 2 turns activity    Assist        Assist Level: Supervision/Verbal cueing   Wheelchair 150 feet activity     Assist      Assist Level: Supervision/Verbal cueing   Blood pressure 120/70, pulse (!) 58, temperature 98.5 F (36.9 C), temperature source Oral, resp. rate 18, height 5\' 6"  (1.676 m), weight 85.8 kg, SpO2 94 %.  Medical Problem List and Plan: 1.Decreased functional mobilitysecondary to lumbar epidural hematoma status post removal of epidural hematoma L2 S1 09/20/2019 after decompressive lumbar laminectomy and foraminotomies L2-3, 3-4, L4-5, L5-S1 09/16/2019. No back brace required -admit to inpatient rehab             -ELOS 7 days, Mod I goals  10/1- will order Kpad per pt request; pt d/c is scheduled for Sunday, at this point.  10/2- wants a rollator- PT looking into it. 2. Antithrombotics: -DVT/anticoagulation:SCD.               -dopplers not need, pt ambulatory -antiplatelet therapy: N/A 3. Pain Management:Oxycodone and Flexeril as needed 4. Mood:Klonopin 0.5 mg twice daily, trazodone 200 mg nightly as needed -antipsychotic agents: N/A 5. Neuropsych: This patientiscapable of making decisions on hisown behalf. 6. Skin/Wound Care:Routine skin checks 7. Fluids/Electrolytes/Nutrition:Routine in and outs with follow-up chemistries 8. Hypertension. Norvasc 5 mg daily, Lasix 80 mg twice daily, Lopressor 100 mg  twice daily -monitor bp's with activity 9. Urinary retention. Remove foley at discharge -begin voiding trial, check pvr's -I/O caths prn. Was apparently I/O cathing at home 10. Rheumatoid arthritis. 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 Creatinine baseline 1.29-1.72. Continue Lasix as prior to admission  9/30- Cr 1.56- but BUN has jumped to 57 from high 30s- will ask him to drink more- recheck Friday-  10/2- BUN 53 and Cr 1.70- Cr baseline; BUN still up.  10/3- BUN didn't change- is 52; and Cr improved to 1.6- will con't to encourage pt to drink- no signs of GI bleed to explain jump in BUN   12. History of gout. Colchicine 0.6 mg daily and Zyloprim 300 mg daily.  -no flare ups at present 13. Diabetes mellitus. Hemoglobin A1c 8.1. SSI. With cbg's tid/ac&hs -monitor for trends CBG (last 3)  Recent Labs    09/27/19 1655 09/27/19 2138 09/28/19 0622  GLUCAP 182* 237* 184*   9/30- BGs elevated- will monitor and make changes tomorrow if still elevated/need a trend.  10/3- BGs  Mainly upper 100s- will send home on current regimen.  14. Constipation. Laxative assistance 15. Leukopenia  9/30- will recheck Friday- no Sx's of illness  10/2- resolved     LOS: 4 days A FACE TO FACE  EVALUATION WAS PERFORMED  Deshon Koslowski 09/28/2019, 11:18 AM

## 2019-09-28 NOTE — Progress Notes (Signed)
Occupational Therapy Session Note  Patient Details  Name: Jerry York MRN: [id number] Date of Birth: [birth year]  Today's Date: 09/28/2019 OT Individual Time: 1300-1355 OT Individual Time Calculation (min): 55 min    Short Term Goals: Week 1:  OT Short Term Goal 1 (Week 1): STG=LTG due to LOS  Skilled Therapeutic Interventions/Progress Updates:    Patient in bed, independent with finishing lunch.  Demonstrated mod I bed mobility and walker management in room, able to donn/doff shoes independently.  He declined shower as offered for this session.  Completed mod I ambulation on unit to/from therapy gym settings with appropriate rest breaks.  Reviewed LB and shoulder stretching options while maintaining back precautions.  He returned to bed at close of session and states that he feels confident for a discharge to home tomorrow with wife.    Therapy Documentation Precautions:  Precautions Precautions: Fall, Back Precaution Booklet Issued: No(reviewed BLT) Precaution Comments: no LSO Restrictions Weight Bearing Restrictions: No General:   Vital Signs:  Pain: Pain Assessment Pain Scale: 0-10 Pain Score: 4  Pain Type: Surgical pain Pain Location: Back Pain Orientation: Lower Pain Descriptors / Indicators: Tender Pain Intervention(s): Repositioned   Other Treatments:     Therapy/Group: Individual Therapy  Carlos Levering 09/28/2019, 1:57 PM

## 2019-09-28 NOTE — Progress Notes (Signed)
Physical Therapy Discharge Summary  Patient Details  Name: Jerry York MRN: [id number] Date of Birth: [birth year]  Today's Date: 09/28/2019 PT Individual Time: 0900-1000 PT Individual Time Calculation (min): 60 min    Patient has met 11 of 11 long term goals due to improved activity tolerance, improved balance, increased strength, decreased pain and ability to compensate for deficits.  Patient to discharge at an ambulatory level Modified Independent.   Patient's care partner is not necessary to provide assistance at discharge as pt is at mod I level with use of a RW.  Reasons goals not met: Pt has met all rehab goals.  Recommendation:  Patient will benefit from ongoing skilled PT services in outpatient setting to continue to advance safe functional mobility, address ongoing impairments in balance, strength, endurance, and minimize fall risk.  Equipment: No equipment provided. Pt already owns RW.  Reasons for discharge: treatment goals met and discharge from hospital  Patient/family agrees with progress made and goals achieved: Yes   Skilled Intervention: Pt received seated in bed, agreeable to PT session. No complaints of pain. Bed mobility mod I with increased time. Pt performs sit to stand to RW at mod I level throughout session. Ambulation x 200 ft with RW at mod I level. Car transfer at mod I with RW. Ascend/descend x 12 stairs with 2 handrails at mod I level, step-to gait pattern. Patient demonstrates increased fall risk as noted by score of  34/56 on Berg Balance Scale.  (<36= high risk for falls, close to 100%; 37-45 significant >80%; 46-51 moderate >50%; 52-55 lower >25%), improved from 26/56 on 9/30. Nustep level 4 x 10 min with use of B UE/LE for global endurance training. Pt left seated in bed with needs in reach at end of session. Pt is safe to d/c home tomorrow.  PT Discharge Precautions/Restrictions Precautions Precautions: Fall;Back Precaution Booklet Issued:  No(reviewed BLT) Restrictions Weight Bearing Restrictions: No Vision/Perception  Perception Perception: Within Functional Limits Praxis Praxis: Intact  Cognition Overall Cognitive Status: Within Functional Limits for tasks assessed Arousal/Alertness: Awake/alert Orientation Level: Oriented X4 Attention: Focused Focused Attention: Appears intact Memory: Impaired Memory Impairment: Decreased recall of new information;Decreased short term memory Awareness: Impaired Awareness Impairment: Anticipatory impairment Problem Solving: Impaired Behaviors: Impulsive Safety/Judgment: Impaired Comments: Decreased safety awareness/awareness of deficits Sensation Sensation Light Touch: Appears Intact Proprioception: Appears Intact Coordination Gross Motor Movements are Fluid and Coordinated: Yes Fine Motor Movements are Fluid and Coordinated: Yes Motor  Motor Motor: Within Functional Limits  Mobility Bed Mobility Bed Mobility: Rolling Right;Rolling Left;Right Sidelying to Sit Rolling Right: Independent Rolling Left: Independent Right Sidelying to Sit: Independent Transfers Transfers: Sit to Stand;Stand to Sit;Stand Pivot Transfers Sit to Stand: Independent with assistive device Stand to Sit: Independent with assistive device Stand Pivot Transfers: Independent with assistive device Transfer (Assistive device): Rolling walker Locomotion  Gait Ambulation: Yes Gait Assistance: Independent with assistive device Assistive device: Rolling walker Gait Gait: Yes Gait Pattern: Impaired Gait Pattern: Step-through pattern;Decreased step length - left;Decreased step length - right;Decreased trunk rotation;Narrow base of support Stairs / Additional Locomotion Stairs: Yes Stairs Assistance: Independent with assistive device Stair Management Technique: Two rails Height of Stairs: 6 Ramp: Independent with assistive device Wheelchair Mobility Wheelchair Mobility: No  Trunk/Postural  Assessment  Cervical Assessment Cervical Assessment: Within Functional Limits Thoracic Assessment Thoracic Assessment: Within Functional Limits(rounded shoulders) Lumbar Assessment Lumbar Assessment: Exceptions to WFL(decreased lordosis) Postural Control Postural Control: Within Functional Limits  Balance Balance Balance Assessed: Yes Standardized Balance Assessment Standardized Balance  Assessment: Berg Balance Test Berg Balance Test Sit to Stand: Able to stand without using hands and stabilize independently Standing Unsupported: Able to stand safely 2 minutes Sitting with Back Unsupported but Feet Supported on Floor or Stool: Able to sit safely and securely 2 minutes Stand to Sit: Sits safely with minimal use of hands Transfers: Able to transfer safely, definite need of hands Standing Unsupported with Eyes Closed: Able to stand 10 seconds with supervision Standing Ubsupported with Feet Together: Able to place feet together independently and stand for 1 minute with supervision From Standing, Reach Forward with Outstretched Arm: Reaches forward but needs supervision From Standing Position, Pick up Object from Floor: Unable to try/needs assist to keep balance From Standing Position, Turn to Look Behind Over each Shoulder: Turn sideways only but maintains balance Turn 360 Degrees: Able to turn 360 degrees safely but slowly Standing Unsupported, Alternately Place Feet on Step/Stool: Able to complete >2 steps/needs minimal assist Standing Unsupported, One Foot in Front: Able to take small step independently and hold 30 seconds Standing on One Leg: Tries to lift leg/unable to hold 3 seconds but remains standing independently Total Score: 34 Static Sitting Balance Static Sitting - Balance Support: Feet supported;No upper extremity supported Static Sitting - Level of Assistance: 7: Independent Dynamic Sitting Balance Dynamic Sitting - Balance Support: Feet supported;No upper extremity  supported;During functional activity Dynamic Sitting - Level of Assistance: 6: Modified independent (Device/Increase time) Static Standing Balance Static Standing - Balance Support: Bilateral upper extremity supported;During functional activity Static Standing - Level of Assistance: 6: Modified independent (Device/Increase time) Dynamic Standing Balance Dynamic Standing - Balance Support: Bilateral upper extremity supported;During functional activity Dynamic Standing - Level of Assistance: 6: Modified independent (Device/Increase time) Extremity Assessment   RLE Assessment RLE Assessment: Within Functional Limits Passive Range of Motion (PROM) Comments: tight hamstrings and heel cords General Strength Comments: 4/5 grossly LLE Assessment LLE Assessment: Exceptions to [facility] Passive Range of Motion (PROM) Comments: tight hamstrings and heel cords General Strength Comments: grossly 4/5, hip 3/5     Excell Seltzer, PT, DPT 09/28/2019, 2:22 PM

## 2019-09-29 LAB — GLUCOSE, CAPILLARY: Glucose-Capillary: 160 mg/dL — ABNORMAL HIGH (ref 70–99)

## 2019-09-29 NOTE — Progress Notes (Signed)
Social Work Assessment and Plan   Patient Details  Name: Jerry York MRN: [id number] Date of Birth: 1951-10-11  Today's Date: 09/27/2019  Problem List:  Patient Active Problem List   Diagnosis Date Noted  . Neurogenic bladder 09/25/2019  . Epidural hematoma ([place]) 09/24/2019  . S/P lumbar laminectomy 09/16/2019  . Lower extremity edema 12/11/2017  . Family history of premature CAD 11/27/2017  . 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 Type 2 diabetes mellitus ([name]) 08/09/2017  . Gout 08/09/2017  . Anxiety with insomnia 08/09/2017  . Hyperlipidemia associated with type 2 diabetes mellitus ([place]) 08/08/2017  . Hypertension associated with diabetes ([place]) 08/08/2017   Past Medical History:  Past Medical History:  Diagnosis Date  . Anxiety   . Bilateral renal cysts   . Gout    02-22-2017 acute right foot gout---  per pt resolved  . Gout   . Gross hematuria   . Heart murmur   . History of BPH 08/14/2017  . Hydronephrosis, left   . Hyperlipidemia   . Hypertension   . Renal insufficiency   . Rheumatoid arthritis ([place])   . Swelling   . Type 2 diabetes mellitus ([place])   . Urinary retention   . Wears glasses    Past Surgical History:  Past Surgical History:  Procedure Laterality Date  . APPENDECTOMY  age 23  . CYSTOSCOPY WITH URETEROSCOPY AND STENT PLACEMENT Left 05/08/2017   Procedure: URETEROSCOPY AND STENT PLACEMENT;  Surgeon: Kathie Rhodes, MD;  Location: [facility];  Service: Urology;  Laterality: Left;  . CYSTOSCOPY/RETROGRADE/URETEROSCOPY Bilateral 05/08/2017   Procedure: CYSTOSCOPY/ BILATERAL RETROGRADE;  Surgeon: Kathie Rhodes, MD;  Location: [facility];  Service: Urology;  Laterality: Bilateral;  . HEMATOMA EVACUATION N/A 09/20/2019   Procedure: EVACUATION LUMBAR EPIDURAL HEMATOMA;  Surgeon: Newman Pies, MD;  Location: [facility];  Service: Neurosurgery;  Laterality: N/A;  EVACUATION LUMBAR EPIDURAL  HEMATOMA  . INGUINAL HERNIA REPAIR Right 10/10/2000  . KNEE ARTHROSCOPY Right 1980's  . LUMBAR LAMINECTOMY/DECOMPRESSION MICRODISCECTOMY N/A 09/16/2019   Procedure: Laminectomy and Foraminotomy - Lumbar Two-Lumbar Three - Lumbar Three-Lumbar Four - Lumbar Four-Lumbar Five - Lumbar Five-Sacral One;  Surgeon: Eustace Moore, MD;  Location[facility];  Service: Neurosurgery;  Laterality: N/A;  Laminectomy and Foraminotomy - Lumbar Two-Lumbar Three - Lumbar Three-Lumbar Four - Lumbar Four-Lumbar Five - Lumbar Five-Sacral One  . NASAL FRACTURE SURGERY     in highschool   . SHOULDER ARTHROSCOPY WITH OPEN ROTATOR CUFF REPAIR Right 1990's  . TRANSURETHRAL RESECTION OF PROSTATE     Social History:  reports that he has never smoked. He has never used smokeless tobacco. He reports that he does not drink alcohol or use drugs.  Family / Support Systems Marital Status: Married Patient Roles: Spouse Spouse/Significant Other: wife, Horace Lukas @ [phone] Children: daughter, Caprice Renshaw @ [phone] Anticipated Caregiver: wife Ability/Limitations of Caregiver: wife has shoulder issues so can not lift him or pull on him Caregiver Availability: 24/7 Family Dynamics: Pt notes wife is very supportive  Social History Preferred language: English Religion: Non-Denominational Cultural Background: NA Employment Status: Retired Public relations account executive Issues: None Guardian/Conservator: None - per MD, pt is capable of making decisions on his own behalf.   Abuse/Neglect Abuse/Neglect Assessment Can Be Completed: Yes Physical Abuse: Denies Verbal Abuse: Denies Sexual Abuse: Denies Exploitation of patient/patient's resources: Denies Self-Neglect: Denies  Emotional Status Pt's affect, behavior and adjustment status: Pt lying in bed and able to complete  the assessment interview without difficulty.  Aware that his LOS to be short with plan for d/c on Sunday 10/4.  Denies any emotional  distress. Recent Psychosocial Issues: recent lumbar surgery Psychiatric History: None Substance Abuse History: None  Patient / Family Perceptions, Expectations & Goals Pt/Family understanding of illness & functional limitations: Pt and wife with good understanding of medical issues and current functional limitations/ need for CIR. Premorbid pt/family roles/activities: independent Anticipated changes in roles/activities/participation: little changes anticipated Pt/family expectations/goals: "I just want to get started on recovering"  [employer]: None Premorbid Home Care/DME Agencies: None Transportation available at discharge: yes  Discharge Planning Living Arrangements: Spouse/significant other Support Systems: Spouse/significant other, Children Type of Residence: Private residence Community education officer Resources: Media planner (specify)([employer]) Financial Resources: Restaurant manager, fast food Screen Referred: No Living Expenses: Own Money Management: Patient, Spouse Does the patient have any problems obtaining your medications?: No Home Management: pt and wufe Patient/Family Preliminary Plans: Pt to d/c home with wife as primary support. Social Work Anticipated Follow Up Needs: HH/OP Expected length of stay: 4-6 days  Clinical Impression Pleasant gentleman here following further lumbar surgery.  Good progress and short LOS anticipated.  Will follow for d/c planning needs.  Zayne Draheim 09/27/2019, 2:24 PM

## 2019-09-29 NOTE — Progress Notes (Signed)
Pt discharged to home with family. Pt has all belongings with him.

## 2019-09-29 NOTE — Progress Notes (Signed)
Social Work Discharge Note   The overall goal for the admission was met for:   Discharge location: Yes - home with wife to provide any needed assistance  Length of Stay: Yes - 5 days  Discharge activity level: Yes - modified independent  Home/community participation: Yes  Services provided included: MD, RD, PT, OT, RN, Pharmacy and SW  Financial Services: Private Insurance: [employer]  Follow-up services arranged: Outpatient: PT via [facility] and Patient/Family has no preference for HH/DME agencies  Comments (or additional information):    Contact info:  Wife, Cheryl Gavilanes @ [phone]  Patient/Family verbalized understanding of follow-up arrangements: Yes  Individual responsible for coordination of the follow-up plan: pt  Confirmed correct DME delivered:  NA    HOYLE, LUCY 

## 2019-09-30 ENCOUNTER — Encounter: Payer: Self-pay | Admitting: Family Medicine

## 2019-10-01 NOTE — Telephone Encounter (Signed)
Called and spoke with wife.   Patient recently discharged from in patient rehab on 09/29/19.  Okayed for patient to receive flu shot @ 945 on 10/02/19.  Patient is following up with Neurosurgeon post hospital discharge- Dr. Sherley Bounds.

## 2019-10-02 ENCOUNTER — Ambulatory Visit: Payer: PPO | Attending: Physician Assistant

## 2019-10-02 ENCOUNTER — Ambulatory Visit: Payer: Self-pay

## 2019-10-02 ENCOUNTER — Other Ambulatory Visit: Payer: Self-pay

## 2019-10-02 VITALS — BP 118/72 | HR 62

## 2019-10-02 DIAGNOSIS — R2689 Other abnormalities of gait and mobility: Secondary | ICD-10-CM | POA: Diagnosis not present

## 2019-10-02 DIAGNOSIS — M6281 Muscle weakness (generalized): Secondary | ICD-10-CM | POA: Diagnosis not present

## 2019-10-02 NOTE — Therapy (Signed)
[facility] [address] [place], [place], [address] Phone: [phone]   Fax:  [phone]  Physical Therapy Evaluation  Patient Details  Name: Jerry York MRN: [id number] Date of Birth: 03-28-1951 Referring Provider (PT): Melven Sartorius, [place]   Encounter Date: 10/02/2019  PT End of Session - 10/02/19 1402    Visit Number  1    Number of Visits  11    Date for PT Re-Evaluation  11/29/19    PT Start Time  5176    PT Stop Time  1401    PT Time Calculation (min)  46 min    Activity Tolerance  Patient tolerated treatment well    Behavior During Therapy  [facility] for tasks assessed/performed       Past Medical History:  Diagnosis Date  . Anxiety   . Bilateral renal cysts   . Gout    02-22-2017 acute right foot gout---  per pt resolved  . Gout   . Gross hematuria   . Heart murmur   . History of BPH 08/14/2017  . Hydronephrosis, left   . Hyperlipidemia   . Hypertension   . Renal insufficiency   . Rheumatoid arthritis ([place])   . Swelling   . Type 2 diabetes mellitus ([place])   . Urinary retention   . Wears glasses     Past Surgical History:  Procedure Laterality Date  . APPENDECTOMY  age 25  . CYSTOSCOPY WITH URETEROSCOPY AND STENT PLACEMENT Left 05/08/2017   Procedure: URETEROSCOPY AND STENT PLACEMENT;  Surgeon: Kathie Rhodes, MD;  Location: [facility];  Service: Urology;  Laterality: Left;  . CYSTOSCOPY/RETROGRADE/URETEROSCOPY Bilateral 05/08/2017   Procedure: CYSTOSCOPY/ BILATERAL RETROGRADE;  Surgeon: Kathie Rhodes, MD;  Location: [facility];  Service: Urology;  Laterality: Bilateral;  . HEMATOMA EVACUATION N/A 09/20/2019   Procedure: EVACUATION LUMBAR EPIDURAL HEMATOMA;  Surgeon: Newman Pies, MD;  Location: [facility];  Service: Neurosurgery;  Laterality: N/A;  EVACUATION LUMBAR EPIDURAL HEMATOMA  . INGUINAL HERNIA REPAIR Right 10/10/2000  . KNEE ARTHROSCOPY Right 1980's  .  LUMBAR LAMINECTOMY/DECOMPRESSION MICRODISCECTOMY N/A 09/16/2019   Procedure: Laminectomy and Foraminotomy - Lumbar Two-Lumbar Three - Lumbar Three-Lumbar Four - Lumbar Four-Lumbar Five - Lumbar Five-Sacral One;  Surgeon: Eustace Moore, MD;  [facility];  Service: Neurosurgery;  Laterality: N/A;  Laminectomy and Foraminotomy - Lumbar Two-Lumbar Three - Lumbar Three-Lumbar Four - Lumbar Four-Lumbar Five - Lumbar Five-Sacral One  . NASAL FRACTURE SURGERY     in highschool   . SHOULDER ARTHROSCOPY WITH OPEN ROTATOR CUFF REPAIR Right 1990's  . TRANSURETHRAL RESECTION OF PROSTATE      Vitals:   10/02/19 1321  BP: 118/72  Pulse: 62     Subjective Assessment - 10/02/19 1321    Subjective  Pt had lumbar laminectomy by Dr. Ronnald Ramp. Returned home and developed issues with urination and returned to hospital and they did lumbar exploration with removal of hematoma. Pt reports prior to back surgery being very limited with walking only a few steps and having to stop. Pain had been progressively worse over the last 2 years. Pain did refer down to toes prior to surgery. Reports no longer having radicular symptoms since surgery.    Pertinent History  68 y.o. right-handed male with history of HTN, RA, hyperlipidemia,DM, CKD stage II.  09/16/19 L2-S1 lumbar laminectomy, 9/25 lumbar reexploration with removal of epidural hematoma.    How long can you walk comfortably?  can walk comfortable about  30' but then tires out per patient report    Patient Stated Goals  Pt wants to get to moving more.    Currently in Pain?  Yes   pain worse if sits too long, worst pain has been is 4/10 the last few days. No radicular symptoms now.   Pain Score  0-No pain    Pain Location  Back   sore   Pain Descriptors / Indicators  Sore    Pain Type  Surgical pain    Pain Onset  More than a month ago    Pain Frequency  Intermittent    Pain Relieving Factors  muscle relaxer         [facility] PT Assessment - 10/02/19 1327       Assessment   Medical Diagnosis  lumbar laminectomy, epidural hematoma    Referring Provider (PT)  Eino Farberaniel Angiullli, PA    Onset Date/Surgical Date  09/16/19    Hand Dominance  Right    Prior Therapy  Acute care      Precautions   Precautions  Back    Precaution Comments  Pt able to recall 2/3 back precautions. Reports he is doing log roll at home and denies questions.      Balance Screen   Has the patient fallen in the past 6 months  No    Has the patient had a decrease in activity level because of a fear of falling?   No    Is the patient reluctant to leave their home because of a fear of falling?   No      Home Public house managernvironment   Living Environment  Private residence    Living Arrangements  Spouse/significant other    Available Help at Discharge  Family    Type of Home  House    Home Access  Ramped entrance;Stairs to enter    Entrance Stairs-Number of Steps  4    Entrance Stairs-Rails  Can reach both    Home Layout  One level    Home Equipment  [place] - single point;Walker - 2 wheels;Crutches;Grab bars - tub/shower   built in shower seat in walk in shower     Prior Function   Level of Independence  Independent   was very limited last couple years, however, due to pain   Vocation  Retired    Leisure  video games, fishing      Cognition   Overall Cognitive Status  Within Functional Limits for tasks assessed      Observation/Other Assessments   Observations  incision intact with only slight scabbing      Sensation   Light Touch  Appears Intact    Additional Comments  intact to light touch in feet      ROM / Strength   AROM / PROM / Strength  Strength      Strength   Overall Strength Comments  right shoulder 3+/5 with history of shoulder surgery, LE strength grossly 4+/5    Strength Assessment Site  Hip;Knee;Ankle    Right/Left Hip  Right;Left    Right Hip Flexion  4+/5    Left Hip Flexion  4+/5    Right/Left Knee  Right;Left    Right Knee Flexion  4+/5    Right Knee  Extension  5/5    Left Knee Flexion  4+/5    Left Knee Extension  5/5    Right/Left Ankle  Right;Left    Right Ankle Dorsiflexion  4+/5  Left Ankle Dorsiflexion  4+/5      Transfers   Transfers  Sit to Stand    Sit to Stand  7: Independent    Comments  30 sec sit to stand from chair without UE support=6      Ambulation/Gait   Ambulation/Gait  Yes    Ambulation/Gait Assistance  5: Supervision    Ambulation Distance (Feet)  75 Feet    Assistive device  Straight cane    Gait Pattern  Decreased step length - right;Decreased step length - left;Step-through pattern   staggers at times   Gait velocity  15.81 sec=0.71m/s      Standardized Balance Assessment   Standardized Balance Assessment  Berg Balance Test      Berg Balance Test   Sit to Stand  Able to stand  independently using hands    Standing Unsupported  Able to stand safely 2 minutes    Sitting with Back Unsupported but Feet Supported on Floor or Stool  Able to sit safely and securely 2 minutes    Stand to Sit  Sits safely with minimal use of hands    Transfers  Able to transfer safely, minor use of hands    Standing Unsupported with Eyes Closed  Able to stand 10 seconds safely    Standing Unsupported with Feet Together  Able to place feet together independently and stand 1 minute safely    From Standing, Reach Forward with Outstretched Arm  Can reach forward >12 cm safely (5")    From Standing Position, Pick up Object from Floor  Able to pick up shoe safely and easily    From Standing Position, Turn to Look Behind Over each Shoulder  Turn sideways only but maintains balance    Turn 360 Degrees  Able to turn 360 degrees safely but slowly    Standing Unsupported, Alternately Place Feet on Step/Stool  Able to complete >2 steps/needs minimal assist    Standing Unsupported, One Foot in Front  Able to plae foot ahead of the other independently and hold 30 seconds    Standing on One Leg  Tries to lift leg/unable to hold 3 seconds  but remains standing independently    Total Score  43                Objective measurements completed on examination: See above findings.              PT Education - 10/02/19 1412    Education Details  Pt educated on PT plan of care. Instructed to perform sit to stand 5 x 2 daily.    Person(s) Educated  Patient    Methods  Explanation    Comprehension  Verbalized understanding;Returned demonstration       PT Short Term Goals - 10/02/19 1558      PT SHORT TERM GOAL #1   Title  Pt will be instructed in proper body mechanics to protect back in order to prevent injury and be able to demonstrate compliance.    Time  4    Period  Weeks    Status  New    Target Date  11/01/19      PT SHORT TERM GOAL #2   Title  Pt will be independent with initial HEP for core stabilization, LE strengthening and balance to be safe to work on at home.    Time  4    Period  Weeks    Status  New    Target  Date  11/01/19        PT Long Term Goals - 10/02/19 1601      PT LONG TERM GOAL #1   Title  Pt will be independent in progressive HEP to continue strength, balance and walking program on own.    Time  6    Period  Weeks    Status  New    Target Date  11/15/19      PT LONG TERM GOAL #2   Title  Pt will increase gait speed from 0.2673m/s to >0.2331m/s for improved gait safety in community.    Baseline  0.8573m/s on 10/02/19    Time  6    Period  Weeks    Status  New    Target Date  11/15/19      PT LONG TERM GOAL #3   Title  Pt will increase Berg Balance score from 43/56 to >47/56 for improved balance and decreased fall risk.    Baseline  43/56 on 10/02/19    Time  6    Period  Weeks    Status  New    Target Date  11/15/19      PT LONG TERM GOAL #4   Title  Pt will increase 30 sec sit to stand from 6 reps to 8 or more without UE support for improved functional strength and endurance.    Baseline  6 on 10/02/2019    Time  6    Period  Weeks    Status  New    Target Date   11/15/19      PT LONG TERM GOAL #5   Title  Pt will ambulate >300' on varied surfaces with SPC versus no device mod I for improved community mobility.    Time  6    Period  Weeks    Status  New    Target Date  11/15/19             Plan - 10/02/19 1413    Clinical Impression Statement  Pt presents to PT s/p lumbar laminectomy with epidural hematoma. Pt's strength upon MMT appears good in LE but with functional strength deficits more pronounced with activities including decreased 30 sec sit to stand reps from norm. Pt is fall risk basked on Berg score of 43/56 and is using SPC at this time. PT did note times of staggering with gait and patient fatigues quickly as has been very limited in activities for last couple years due to back pain. Back pain appears much improved since surgery with no radicular symptoms. Pt will benefit from skilled PT to address functional strength, balance and gait deficits to maximize independence and safety in the community.    Personal Factors and Comorbidities  Comorbidity 3+    Comorbidities  HTN, RA, hyperlipidemia,DM, CKD stage II.    Examination-Activity Limitations  Locomotion Level;Transfers;Squat;Stairs;Stand;Sleep    Examination-Participation Restrictions  Community Activity;Cleaning;Shop;Yard Work    Stability/Clinical Decision Making  Evolving/Moderate complexity    Clinical Decision Making  Moderate    Rehab Potential  Good    PT Frequency  2x / week   then 1x/week x 2 weeks   PT Duration  4 weeks    PT Treatment/Interventions  ADLs/Self Care Home Management;Therapeutic activities;Therapeutic exercise;Balance training;DME Instruction;Cryotherapy;Gait training;Stair training;Neuromuscular re-education;Patient/family education    PT Next Visit Plan  Gait training with cane. I started on 5x 2 sit to stand for home program. Please progress with core stabilization exercises and standing balance. Was  challenged with narrow BOS activities.    Consulted  and Agree with Plan of Care  Patient       Patient will benefit from skilled therapeutic intervention in order to improve the following deficits and impairments:  Abnormal gait, Decreased balance, Decreased strength, Decreased mobility, Pain, Decreased endurance, Difficulty walking  Visit Diagnosis: Other abnormalities of gait and mobility  Muscle weakness (generalized)     Problem List Patient Active Problem List   Diagnosis Date Noted  . Neurogenic bladder 09/25/2019  . Epidural hematoma (HCC) 09/24/2019  . S/P lumbar laminectomy 09/16/2019  . Lower extremity edema 12/11/2017  . Family history of premature CAD 11/27/2017  . 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 Type 2 diabetes mellitus (HCC) 08/09/2017  . Gout 08/09/2017  . Anxiety with insomnia 08/09/2017  . Hyperlipidemia associated with type 2 diabetes mellitus (HCC) 08/08/2017  . Hypertension associated with diabetes ([facility]) 08/08/2017    Ronn Melena, PT, DPT, NCS 10/02/2019, 4:12 PM  [facility] [address] [place], [place], [address] Phone: [phone]   Fax:  [phone]  Name: Jerry York MRN: [id number] Date of Birth: [birth year]

## 2019-10-07 ENCOUNTER — Other Ambulatory Visit: Payer: Self-pay

## 2019-10-07 ENCOUNTER — Encounter: Payer: Self-pay | Admitting: Family Medicine

## 2019-10-07 ENCOUNTER — Ambulatory Visit (INDEPENDENT_AMBULATORY_CARE_PROVIDER_SITE_OTHER): Payer: PPO

## 2019-10-07 DIAGNOSIS — Z23 Encounter for immunization: Secondary | ICD-10-CM | POA: Diagnosis not present

## 2019-10-09 ENCOUNTER — Ambulatory Visit: Payer: PPO | Admitting: Physical Therapy

## 2019-10-09 ENCOUNTER — Other Ambulatory Visit: Payer: Self-pay

## 2019-10-09 ENCOUNTER — Encounter: Payer: Self-pay | Admitting: Physical Therapy

## 2019-10-09 DIAGNOSIS — R2689 Other abnormalities of gait and mobility: Secondary | ICD-10-CM | POA: Diagnosis not present

## 2019-10-09 DIAGNOSIS — M6281 Muscle weakness (generalized): Secondary | ICD-10-CM

## 2019-10-09 NOTE — Patient Instructions (Signed)
Access Code: [email or web address]  URL: [email or web address]  Date: 10/09/2019  Prepared by: Willow Ora   Exercises Supine Posterior Pelvic Tilt - 10 reps - 1 sets - 5 hold - 1x daily - 5x weekly Supine Bridge - 10 reps - 1 sets - 1x daily - 5x weekly Supine March with Posterior Pelvic Tilt - 10 reps - 1 sets - 1x daily - 5x weekly Hooklying Clamshell with Resistance - 10 reps - 1 sets - 5 hold - 1x daily - 5x weekly Sit to Stand without Arm Support - 5 reps - 2 sets - 1x daily - 5x weekly Standing Near Stance in Corner with Eyes Closed - 2-3 reps - 1 sets - 30 hold - 1x daily - 5x weekly Standing Balance in Corner with Eyes Closed - 10 reps - 1 sets - 1x daily - 5x weekly

## 2019-10-09 NOTE — Therapy (Signed)
[facility] [address] [place] [employer] [place], [place], [address] Phone: [phone]   Fax:  [phone]  Physical Therapy Treatment  Patient Details  Name: Jerry York MRN: [id number] Date of Birth: 11/22/1951 Referring Provider (PT): Melven Sartorius, [place]   Encounter Date: 10/09/2019  PT End of Session - 10/09/19 1323    Visit Number  2    Number of Visits  11    Date for PT Re-Evaluation  11/29/19    PT Start Time  6962    PT Stop Time  9528    PT Time Calculation (min)  40 min    Activity Tolerance  Patient tolerated treatment well;No increased pain    Behavior During Therapy  WFL for tasks assessed/performed       Past Medical History:  Diagnosis Date  . Anxiety   . Bilateral renal cysts   . Gout    02-22-2017 acute right foot gout---  per pt resolved  . Gout   . Gross hematuria   . Heart murmur   . History of BPH 08/14/2017  . Hydronephrosis, left   . Hyperlipidemia   . Hypertension   . Renal insufficiency   . Rheumatoid arthritis ([place])   . Swelling   . Type 2 diabetes mellitus ([place])   . Urinary retention   . Wears glasses     Past Surgical History:  Procedure Laterality Date  . APPENDECTOMY  age 32  . CYSTOSCOPY WITH URETEROSCOPY AND STENT PLACEMENT Left 05/08/2017   Procedure: URETEROSCOPY AND STENT PLACEMENT;  Surgeon: Kathie Rhodes, MD;  Location: [facility];  Service: Urology;  Laterality: Left;  . CYSTOSCOPY/RETROGRADE/URETEROSCOPY Bilateral 05/08/2017   Procedure: CYSTOSCOPY/ BILATERAL RETROGRADE;  Surgeon: Kathie Rhodes, MD;  Location: [facility];  Service: Urology;  Laterality: Bilateral;  . HEMATOMA EVACUATION N/A 09/20/2019   Procedure: EVACUATION LUMBAR EPIDURAL HEMATOMA;  Surgeon: Newman Pies, MD;  Location: [facility];  Service: Neurosurgery;  Laterality: N/A;  EVACUATION LUMBAR EPIDURAL HEMATOMA  . INGUINAL HERNIA REPAIR Right 10/10/2000  . KNEE ARTHROSCOPY  Right 1980's  . LUMBAR LAMINECTOMY/DECOMPRESSION MICRODISCECTOMY N/A 09/16/2019   Procedure: Laminectomy and Foraminotomy - Lumbar Two-Lumbar Three - Lumbar Three-Lumbar Four - Lumbar Four-Lumbar Five - Lumbar Five-Sacral One;  Surgeon: Eustace Moore, MD;  Location: [facility] OR;  Service: Neurosurgery;  Laterality: N/A;  Laminectomy and Foraminotomy - Lumbar Two-Lumbar Three - Lumbar Three-Lumbar Four - Lumbar Four-Lumbar Five - Lumbar Five-Sacral One  . NASAL FRACTURE SURGERY     in highschool   . SHOULDER ARTHROSCOPY WITH OPEN ROTATOR CUFF REPAIR Right 1990's  . TRANSURETHRAL RESECTION OF PROSTATE      There were no vitals filed for this visit.  Subjective Assessment - 10/09/19 1321    Subjective  No new complaints. No falls.    Pertinent History  68 y.o. right-handed male with history of HTN, RA, hyperlipidemia,DM, CKD stage II.  09/16/19 L2-S1 lumbar laminectomy, 9/25 lumbar reexploration with removal of epidural hematoma.    How long can you walk comfortably?  can walk comfortable about 30' but then tires out per patient report    Patient Stated Goals  Pt wants to get to moving more.    Currently in Pain?  Yes    Pain Score  4     Pain Location  Back    Pain Orientation  Lower    Pain Descriptors / Indicators  Aching;Sore    Pain Type  Chronic pain;Surgical pain  Pain Onset  More than a month ago    Pain Frequency  Intermittent    Aggravating Factors   walking and standing    Pain Relieving Factors  sitting, muscle relaxer at night           [facility]/Exercise - 10/09/19 1339      Ambulation/Gait   Ambulation/Gait  Yes    Ambulation/Gait Assistance  5: Supervision    Ambulation/Gait Assistance Details  no balance issues noted.     Ambulation Distance (Feet)  60 Feet   x2 reps   Assistive device  Straight cane    Gait Pattern  Step-through pattern;Decreased stride length;Decreased step length - right;Decreased step length - left    Ambulation Surface   Level;Indoor      Exercises   Exercises  Other Exercises    Other Exercises   educated on and issued ex's for strengthening and balance. max cues needed for abdominal bracing with posterior pelvic tilt. Pt responded better with use of BP cuff under pelvis with gauge needle feedback for when abdominals were engaged. No issues reported with ex's performed today. Refer to [facility] program for full details.                                Issued the following to HEP today:   Access Code: [email or web address]  URL: [email or web address]  Date: 10/09/2019  Prepared by: Sallyanne KusterKathy Sandon Yoho   Exercises Supine Posterior Pelvic Tilt - 10 reps - 1 sets - 5 hold - 1x daily - 5x weekly Supine Bridge - 10 reps - 1 sets - 1x daily - 5x weekly Supine March with Posterior Pelvic Tilt - 10 reps - 1 sets - 1x daily - 5x weekly Hooklying Clamshell with Resistance - 10 reps - 1 sets - 5 hold - 1x daily - 5x weekly Sit to Stand without Arm Support - 5 reps - 2 sets - 1x daily - 5x weekly Standing Near Stance in Corner with Eyes Closed - 2-3 reps - 1 sets - 30 hold - 1x daily - 5x weekly Standing Balance in Corner with Eyes Closed - 10 reps - 1 sets - 1x daily - 5x weekly     PT Education - 10/09/19 1358    Education Details  educated pt on and issued HEP for balance/strengthening    Person(s) Educated  Patient    Methods  Explanation;Demonstration;Verbal cues;Tactile cues;Handout    Comprehension  Verbalized understanding;Returned demonstration;Verbal cues required;Tactile cues required;Need further instruction       PT Short Term Goals - 10/02/19 1558      PT SHORT TERM GOAL #1   Title  Pt will be instructed in proper body mechanics to protect back in order to prevent injury and be able to demonstrate compliance.    Time  4    Period  Weeks    Status  New    Target Date  11/01/19      PT SHORT TERM GOAL #2   Title  Pt will be independent with initial HEP for core stabilization, LE strengthening and  balance to be safe to work on at home.    Time  4    Period  Weeks    Status  New    Target Date  11/01/19        PT Long Term Goals - 10/02/19 1601      PT LONG TERM GOAL #1  Title  Pt will be independent in progressive HEP to continue strength, balance and walking program on own.    Time  6    Period  Weeks    Status  New    Target Date  11/15/19      PT LONG TERM GOAL #2   Title  Pt will increase gait speed from 0.68m/s to >0.60m/s for improved gait safety in community.    Baseline  0.9m/s on 10/02/19    Time  6    Period  Weeks    Status  New    Target Date  11/15/19      PT LONG TERM GOAL #3   Title  Pt will increase Berg Balance score from 43/56 to >47/56 for improved balance and decreased fall risk.    Baseline  43/56 on 10/02/19    Time  6    Period  Weeks    Status  New    Target Date  11/15/19      PT LONG TERM GOAL #4   Title  Pt will increase 30 sec sit to stand from 6 reps to 8 or more without UE support for improved functional strength and endurance.    Baseline  6 on 10/02/2019    Time  6    Period  Weeks    Status  New    Target Date  11/15/19      PT LONG TERM GOAL #5   Title  Pt will ambulate >300' on varied surfaces with SPC versus no device mod I for improved community mobility.    Time  6    Period  Weeks    Status  New    Target Date  11/15/19            Plan - 10/09/19 1323    Clinical Impression Statement  Today's skilled session focused on establishment of an HEP to address strengthening and static standing balance. Mild dizziness reported with standing EC ex's that resolved quickly once eyes were open again. Min guard assist for balance with mild sway noted, no significant balance loss noted with these. The pt is progressing toward goals and should benefit from continued PT to progress toward unmet goals.    Personal Factors and Comorbidities  Comorbidity 3+    Comorbidities  HTN, RA, hyperlipidemia,DM, CKD stage II.     Examination-Activity Limitations  Locomotion Level;Transfers;Squat;Stairs;Stand;Sleep    Examination-Participation Restrictions  Community Activity;Cleaning;Shop;Yard Work    Stability/Clinical Decision Making  Evolving/Moderate complexity    Rehab Potential  Good    PT Frequency  2x / week   then 1x/week x 2 weeks   PT Duration  4 weeks    PT Treatment/Interventions  ADLs/Self Care Home Management;Therapeutic activities;Therapeutic exercise;Balance training;DME Instruction;Cryotherapy;Gait training;Stair training;Neuromuscular re-education;Patient/family education    PT Next Visit Plan  continue to work on core/LE strengthening, standing balance with vision removed, gait training with cane progressing to no device    Consulted and Agree with Plan of Care  Patient       Patient will benefit from skilled therapeutic intervention in order to improve the following deficits and impairments:  Abnormal gait, Decreased balance, Decreased strength, Decreased mobility, Pain, Decreased endurance, Difficulty walking  Visit Diagnosis: Other abnormalities of gait and mobility  Muscle weakness (generalized)     Problem List Patient Active Problem List   Diagnosis Date Noted  . Neurogenic bladder 09/25/2019  . Epidural hematoma (HCC) 09/24/2019  . S/P lumbar laminectomy 09/16/2019  .  Lower extremity edema 12/11/2017  . Family history of premature CAD 11/27/2017  . 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 Type 2 diabetes mellitus (HCC) 08/09/2017  . Gout 08/09/2017  . Anxiety with insomnia 08/09/2017  . Hyperlipidemia associated with type 2 diabetes mellitus (HCC) 08/08/2017  . Hypertension associated with diabetes (HCC) 08/08/2017    Sallyanne KusterKathy Fouad Taul, PTA, [facility] [address], [address] [place], [place] [address] [phone] 10/09/19, 2:28 PM   Name: Jerry York MRN: [id number] Date of Birth: 10/16/1951

## 2019-10-10 ENCOUNTER — Ambulatory Visit: Payer: PPO

## 2019-10-10 DIAGNOSIS — M6281 Muscle weakness (generalized): Secondary | ICD-10-CM

## 2019-10-10 DIAGNOSIS — R2689 Other abnormalities of gait and mobility: Secondary | ICD-10-CM

## 2019-10-10 NOTE — Therapy (Signed)
[facility] [address] [place], [place], [address] Phone: [phone]   Fax:  [phone]  Physical Therapy Treatment  Patient Details  Name: Jerry York MRN: [id number] Date of Birth: 11/16/1951 Referring Provider (PT): Eino Farberaniel Angiullli, [place]   Encounter Date: 10/10/2019  PT End of Session - 10/10/19 1359    Visit Number  3    Number of Visits  11    Date for PT Re-Evaluation  11/29/19    PT Start Time  1315    PT Stop Time  1353    PT Time Calculation (min)  38 min    Activity Tolerance  Patient tolerated treatment well;No increased pain    Behavior During Therapy  WFL for tasks assessed/performed       Past Medical History:  Diagnosis Date  . Anxiety   . Bilateral renal cysts   . Gout    02-22-2017 acute right foot gout---  per pt resolved  . Gout   . Gross hematuria   . Heart murmur   . History of BPH 08/14/2017  . Hydronephrosis, left   . Hyperlipidemia   . Hypertension   . Renal insufficiency   . Rheumatoid arthritis (HCC)   . Swelling   . Type 2 diabetes mellitus (HCC)   . Urinary retention   . Wears glasses     Past Surgical History:  Procedure Laterality Date  . APPENDECTOMY  age 68  . CYSTOSCOPY WITH URETEROSCOPY AND STENT PLACEMENT Left 05/08/2017   Procedure: URETEROSCOPY AND STENT PLACEMENT;  Surgeon: Ihor Gullyttelin, Mark, MD;  [facility];  Service: Urology;  Laterality: Left;  . CYSTOSCOPY/RETROGRADE/URETEROSCOPY Bilateral 05/08/2017   Procedure: CYSTOSCOPY/ BILATERAL RETROGRADE;  Surgeon: Ihor Gullyttelin, Mark, MD;  [facility] [place];  Service: Urology;  Laterality: Bilateral;  . HEMATOMA EVACUATION N/A 09/20/2019   Procedure: EVACUATION LUMBAR EPIDURAL HEMATOMA;  Surgeon: Tressie StalkerJenkins, Jeffrey, MD;  [facility];  Service: Neurosurgery;  Laterality: N/A;  EVACUATION LUMBAR EPIDURAL HEMATOMA  . INGUINAL HERNIA REPAIR Right 10/10/2000  . KNEE ARTHROSCOPY  Right 1980's  . LUMBAR LAMINECTOMY/DECOMPRESSION MICRODISCECTOMY N/A 09/16/2019   Procedure: Laminectomy and Foraminotomy - Lumbar Two-Lumbar Three - Lumbar Three-Lumbar Four - Lumbar Four-Lumbar Five - Lumbar Five-Sacral One;  Surgeon: Tia AlertJones, David S, MD;  [facility];  Service: Neurosurgery;  Laterality: N/A;  Laminectomy and Foraminotomy - Lumbar Two-Lumbar Three - Lumbar Three-Lumbar Four - Lumbar Four-Lumbar Five - Lumbar Five-Sacral One  . NASAL FRACTURE SURGERY     in highschool   . SHOULDER ARTHROSCOPY WITH OPEN ROTATOR CUFF REPAIR Right 1990's  . TRANSURETHRAL RESECTION OF PROSTATE      There were no vitals filed for this visit.  Subjective Assessment - 10/10/19 1317    Subjective  Pt reports doing well just having some pain in back but nothing in legs.    Pertinent History  68 y.o. right-handed male with history of HTN, RA, hyperlipidemia,DM, CKD stage II.  09/16/19 L2-S1 lumbar laminectomy, 9/25 lumbar reexploration with removal of epidural hematoma.    How long can you walk comfortably?  can walk comfortable about 30' but then tires out per patient report    Patient Stated Goals  Pt wants to get to moving more.    Currently in Pain?  Yes    Pain Score  5     Pain Location  Back    Pain Orientation  Lower    Pain Descriptors / Indicators  Aching;Sore  Pain Type  Chronic pain    Pain Onset  More than a month ago    Pain Frequency  Intermittent   only when stands or walks longer                      [facility] Adult PT Treatment/Exercise - 10/10/19 1318      Bed Mobility   Bed Mobility  Rolling Right;Rolling Left;Supine to Sit;Sit to Supine    Rolling Right  Independent    Rolling Left  Independent    Supine to Sit  Independent    Sit to Supine  Independent   Pt utilized log roll with bed mobility     Transfers   Transfers  Sit to Stand;Stand to Sit    Sit to Stand  7: Independent    Stand to Sit  7: Independent      Ambulation/Gait   Ambulation/Gait   Yes    Ambulation/Gait Assistance  5: Supervision    Ambulation Distance (Feet)  115 Feet    Assistive device  Straight cane    Gait Pattern  Step-through pattern;Decreased step length - right;Decreased step length - left;Narrow base of support;Trunk flexed    Ambulation Surface  Level;Indoor    Gait Comments  Pt using cane every other step. As went on did note increased trunk flexion and decreased stablity. Pt reported 7/10 pain in low back.       Neuro Re-ed    Neuro Re-ed Details   Standing with feet together without UE support eyes open and eyes closed x 30 sec with close SBA. Increased sway with eyes closed. Feet together with head turns up/down and left/right x 10. Standing on airex eyes open x 30 sec. Decreased stability on airex losing balance anterior a couple times needing CGA for safety. Marching in place on airex x 10 with bilateral UE support and then x 10 with only right UE support. Marching gait on // bars 8' x 4 with 1 UE support, side stepping in // bar with 1 UE support. Pt needed seated rest breaks every couple exercises to rest back.      Exercises   Exercises  Lumbar;Other Exercises    Other Exercises   Sidelying clamshell x 10 each side with cues for TA contraction      Lumbar Exercises: Supine   Heel Slides  5 reps    Bent Knee Raise  10 reps    Other Supine Lumbar Exercises  unilateral hip ER in hooklying with TA (transverse abdominus) contraction, alternating arm lifts in hooklying with TA x 10             PT Education - 10/10/19 1406    Education Details  Pt instructed to continue with current HEP as had not been able to start yet as was just seen yesterday.    Person(s) Educated  Patient    Methods  Explanation    Comprehension  Verbalized understanding       PT Short Term Goals - 10/02/19 1558      PT SHORT TERM GOAL #1   Title  Pt will be instructed in proper body mechanics to protect back in order to prevent injury and be able to demonstrate  compliance.    Time  4    Period  Weeks    Status  New    Target Date  11/01/19      PT SHORT TERM GOAL #2   Title  Pt  will be independent with initial HEP for core stabilization, LE strengthening and balance to be safe to work on at home.    Time  4    Period  Weeks    Status  New    Target Date  11/01/19        PT Long Term Goals - 10/02/19 1601      PT LONG TERM GOAL #1   Title  Pt will be independent in progressive HEP to continue strength, balance and walking program on own.    Time  6    Period  Weeks    Status  New    Target Date  11/15/19      PT LONG TERM GOAL #2   Title  Pt will increase gait speed from 0.16m/s to >0.77m/s for improved gait safety in community.    Baseline  0.63m/s on 10/02/19    Time  6    Period  Weeks    Status  New    Target Date  11/15/19      PT LONG TERM GOAL #3   Title  Pt will increase Berg Balance score from 43/56 to >47/56 for improved balance and decreased fall risk.    Baseline  43/56 on 10/02/19    Time  6    Period  Weeks    Status  New    Target Date  11/15/19      PT LONG TERM GOAL #4   Title  Pt will increase 30 sec sit to stand from 6 reps to 8 or more without UE support for improved functional strength and endurance.    Baseline  6 on 10/02/2019    Time  6    Period  Weeks    Status  New    Target Date  11/15/19      PT LONG TERM GOAL #5   Title  Pt will ambulate >300' on varied surfaces with SPC versus no device mod I for improved community mobility.    Time  6    Period  Weeks    Status  New    Target Date  11/15/19            Plan - 10/10/19 1408    Clinical Impression Statement  Pt denied any pain with core stab exercises today. Back did fatigue and have some pain with standing balance and gait. Pt has narrow BOS with gait and unsteady at times but able to correct on own.    Personal Factors and Comorbidities  Comorbidity 3+    Comorbidities  HTN, RA, hyperlipidemia,DM, CKD stage II.     Examination-Activity Limitations  Locomotion Level;Transfers;Squat;Stairs;Stand;Sleep    Examination-Participation Restrictions  Community Activity;Cleaning;Shop;Yard Work    Stability/Clinical Decision Making  Evolving/Moderate complexity    Rehab Potential  Good    PT Frequency  2x / week   then 1x/week x 2 weeks   PT Duration  4 weeks    PT Treatment/Interventions  ADLs/Self Care Home Management;Therapeutic activities;Therapeutic exercise;Balance training;DME Instruction;Cryotherapy;Gait training;Stair training;Neuromuscular re-education;Patient/family education    PT Next Visit Plan  continue to work on core/LE strengthening, standing balance with vision removed, gait training with cane. Educate on body mechanics to protect back.    PT Home Exercise Plan  Access Code: [email or web address]    [email or web address] and Agree with Plan of Care  Patient       Patient will benefit from skilled therapeutic intervention in order to improve the following deficits and impairments:  Abnormal gait, Decreased balance, Decreased strength, Decreased mobility, Pain, Decreased endurance, Difficulty walking  Visit Diagnosis: Other abnormalities of gait and mobility  Muscle weakness (generalized)     Problem List Patient Active Problem List   Diagnosis Date Noted  . Neurogenic bladder 09/25/2019  . Epidural hematoma (HCC) 09/24/2019  . S/P lumbar laminectomy 09/16/2019  . Lower extremity edema 12/11/2017  . Family history of premature CAD 11/27/2017  . 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 Type 2 diabetes mellitus (HCC) 08/09/2017  . Gout 08/09/2017  . Anxiety with insomnia 08/09/2017  . Hyperlipidemia associated with type 2 diabetes mellitus (HCC) 08/08/2017  . Hypertension associated with diabetes (HCC) 08/08/2017    Ronn Melena, PT, DPT, NCS 10/10/2019, 2:11 PM  [facility] [address] [place], [place],  [address] Phone: [phone]   Fax:  [phone]  Name: Jerry York MRN: [id number] Date of Birth: [birth year]

## 2019-10-15 ENCOUNTER — Ambulatory Visit: Payer: PPO

## 2019-10-15 ENCOUNTER — Other Ambulatory Visit: Payer: Self-pay

## 2019-10-15 DIAGNOSIS — R2689 Other abnormalities of gait and mobility: Secondary | ICD-10-CM | POA: Diagnosis not present

## 2019-10-15 DIAGNOSIS — M6281 Muscle weakness (generalized): Secondary | ICD-10-CM

## 2019-10-15 NOTE — Therapy (Signed)
[facility] [address] [place], [place], [address] Phone: [phone]   Fax:  [phone]  Physical Therapy Treatment  Patient Details  Name: Jerry York MRN: [id number] Date of Birth: [birth year] Referring Provider (PT): Eino Farber, [name]   Encounter Date: 10/15/2019  PT End of Session - 10/15/19 1321    Visit Number  4    Number of Visits  11    Date for PT Re-Evaluation  11/29/19    PT Start Time  1316    PT Stop Time  1355    PT Time Calculation (min)  39 min    Activity Tolerance  Patient tolerated treatment well;No increased pain    Behavior During Therapy  WFL for tasks assessed/performed       Past Medical History:  Diagnosis Date  . Anxiety   . Bilateral renal cysts   . Gout    02-22-2017 acute right foot gout---  per pt resolved  . Gout   . Gross hematuria   . Heart murmur   . History of BPH 08/14/2017  . Hydronephrosis, left   . Hyperlipidemia   . Hypertension   . Renal insufficiency   . Rheumatoid arthritis (HCC)   . Swelling   . Type 2 diabetes mellitus (HCC)   . Urinary retention   . Wears glasses     Past Surgical History:  Procedure Laterality Date  . APPENDECTOMY  age 68  . CYSTOSCOPY WITH URETEROSCOPY AND STENT PLACEMENT Left 05/08/2017   Procedure: URETEROSCOPY AND STENT PLACEMENT;  Surgeon: Ihor Gully, MD;  Location: [facility];  Service: Urology;  Laterality: Left;  . CYSTOSCOPY/RETROGRADE/URETEROSCOPY Bilateral 05/08/2017   Procedure: CYSTOSCOPY/ BILATERAL RETROGRADE;  Surgeon: Ihor Gully, MD;  Location: [facility];  Service: Urology;  Laterality: Bilateral;  . HEMATOMA EVACUATION N/A 09/20/2019   Procedure: EVACUATION LUMBAR EPIDURAL HEMATOMA;  Surgeon: Tressie Stalker, MD;  [facility];  Service: Neurosurgery;  Laterality: N/A;  EVACUATION LUMBAR EPIDURAL HEMATOMA  . INGUINAL HERNIA REPAIR Right 10/10/2000  . KNEE ARTHROSCOPY  Right 1980's  . LUMBAR LAMINECTOMY/DECOMPRESSION MICRODISCECTOMY N/A 09/16/2019   Procedure: Laminectomy and Foraminotomy - Lumbar Two-Lumbar Three - Lumbar Three-Lumbar Four - Lumbar Four-Lumbar Five - Lumbar Five-Sacral One;  Surgeon: Tia Alert, MD;  Location: [facility];  Service: Neurosurgery;  Laterality: N/A;  Laminectomy and Foraminotomy - Lumbar Two-Lumbar Three - Lumbar Three-Lumbar Four - Lumbar Four-Lumbar Five - Lumbar Five-Sacral One  . NASAL FRACTURE SURGERY     in highschool   . SHOULDER ARTHROSCOPY WITH OPEN ROTATOR CUFF REPAIR Right 1990's  . TRANSURETHRAL RESECTION OF PROSTATE      There were no vitals filed for this visit.  Subjective Assessment - 10/15/19 1320    Subjective  Pt reports that he was sore Friday and Saturday after last session but did better after that. Getting occasional sharp pains up back when trying to rest but sleeping ok in general.    Pertinent History  68 y.o. right-handed male with history of HTN, RA, hyperlipidemia,DM, CKD stage II.  09/16/19 L2-S1 lumbar laminectomy, 9/25 lumbar reexploration with removal of epidural hematoma.    How long can you walk comfortably?  can walk comfortable about 30' but then tires out per patient report    Patient Stated Goals  Pt wants to get to moving more.    Currently in Pain?  Yes    Pain Score  3     Pain Location  Back  Pain Orientation  Lower;Medial    Pain Descriptors / Indicators  Sore    Pain Type  Surgical pain    Pain Onset  More than a month ago    Pain Frequency  Intermittent                       [facility] Adult PT Treatment/Exercise - 10/15/19 1324      Ambulation/Gait   Ambulation/Gait  Yes    Ambulation/Gait Assistance  5: Supervision    Ambulation Distance (Feet)  115 Feet   repeat 115' with cues to tighten tummy as going   Assistive device  Straight cane    Gait Pattern  Step-through pattern;Trendelenburg    Ambulation Surface  Level;Indoor    Gait Comments  Pt needed seated  break between bouts due to some increased low back pain. PT noted right trendelenberg with gait.      Self-Care   Self-Care  Other Self-Care Comments    Other Self-Care Comments   Educated on back safety: avoiding reaching with bending at back. Suggested reacher to pick up small things. Pt able to verbalize if he drops something just to leave it. Thinks he does have a reacher. Discussed working on LE strengthening more to aide with squatting. PT also discussed breaking up standing activities as pain increases after about 5 min of standing with keeping chair handy especially in kitchen to allow to rest briefly before resuming activity.       Neuro Re-ed    Neuro Re-ed Details   Alternating toe taps on 4" step x 20 with finger tip support on right to begin then switching to left support. Verbal cues to prevent leaning over and keep pelvis level. Standing on airex eyes open x 30 sec then eyes closed x 30 sec with increased sway and CGA for safety.      Exercises   Exercises  Other Exercises    Other Exercises   Step-ups on 4" step with right LE lateral steps x 10 then forward steps x 10 each LE with Ue support. NuStep level 5 x 10 min             PT Education - 10/15/19 1358    Education Details  Pt to continue with current HEP    Person(s) Educated  Patient    Methods  Explanation    Comprehension  Verbalized understanding       PT Short Term Goals - 10/15/19 1359      PT SHORT TERM GOAL #1   Title  Pt will be instructed in proper body mechanics to protect back in order to prevent injury and be able to demonstrate compliance.    Baseline  Pt has been instructed in back safety including no bending/lifting/twisting. Pt knows not to bend over to get something off floor but to utilize reacher or squatting with legs if something light. Pt demos good log roll with activities.    Time  4    Period  Weeks    Status  Achieved    Target Date  11/01/19      PT SHORT TERM GOAL #2   Title   Pt will be independent with initial HEP for core stabilization, LE strengthening and balance to be safe to work on at home.    Time  4    Period  Weeks    Status  New    Target Date  11/01/19  PT Long Term Goals - 10/02/19 1601      PT LONG TERM GOAL #1   Title  Pt will be independent in progressive HEP to continue strength, balance and walking program on own.    Time  6    Period  Weeks    Status  New    Target Date  11/15/19      PT LONG TERM GOAL #2   Title  Pt will increase gait speed from 0.5559m/s to >0.817m/s for improved gait safety in community.    Baseline  0.6859m/s on 10/02/19    Time  6    Period  Weeks    Status  New    Target Date  11/15/19      PT LONG TERM GOAL #3   Title  Pt will increase Berg Balance score from 43/56 to >47/56 for improved balance and decreased fall risk.    Baseline  43/56 on 10/02/19    Time  6    Period  Weeks    Status  New    Target Date  11/15/19      PT LONG TERM GOAL #4   Title  Pt will increase 30 sec sit to stand from 6 reps to 8 or more without UE support for improved functional strength and endurance.    Baseline  6 on 10/02/2019    Time  6    Period  Weeks    Status  New    Target Date  11/15/19      PT LONG TERM GOAL #5   Title  Pt will ambulate >300' on varied surfaces with SPC versus no device mod I for improved community mobility.    Time  6    Period  Weeks    Status  New    Target Date  11/15/19            Plan - 10/15/19 1400    Clinical Impression Statement  Pt able to increase gait bouts today. Does fatigue in back with some increased pain and needs seated rest breaks with prolonged gait and standing. Pt showing good safety with mechanics with back. Difficulty when relying mostly on vestibular system with balance.    Personal Factors and Comorbidities  Comorbidity 3+    Comorbidities  HTN, RA, hyperlipidemia,DM, CKD stage II.    Examination-Activity Limitations  Locomotion  Level;Transfers;Squat;Stairs;Stand;Sleep    Examination-Participation Restrictions  Community Activity;Cleaning;Shop;Yard Work    Stability/Clinical Decision Making  Evolving/Moderate complexity    Rehab Potential  Good    PT Frequency  2x / week   then 1x/week x 2 weeks   PT Duration  4 weeks    PT Treatment/Interventions  ADLs/Self Care Home Management;Therapeutic activities;Therapeutic exercise;Balance training;DME Instruction;Cryotherapy;Gait training;Stair training;Neuromuscular re-education;Patient/family education    PT Next Visit Plan  continue to work on core/LE strengthening, standing balance with vision removed, gait training with cane.    PT Home Exercise Plan  Access Code: [email or web address]    Consulted and Agree with Plan of Care  Patient       Patient will benefit from skilled therapeutic intervention in order to improve the following deficits and impairments:  Abnormal gait, Decreased balance, Decreased strength, Decreased mobility, Pain, Decreased endurance, Difficulty walking  Visit Diagnosis: Muscle weakness (generalized)     Problem List Patient Active Problem List   Diagnosis Date Noted  . Neurogenic bladder 09/25/2019  . Epidural hematoma (HCC) 09/24/2019  . S/P lumbar laminectomy 09/16/2019  . Lower extremity  edema 12/11/2017  . Family history of premature CAD 11/27/2017  . 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 Type 2 diabetes mellitus (HCC) 08/09/2017  . Gout 08/09/2017  . Anxiety with insomnia 08/09/2017  . Hyperlipidemia associated with type 2 diabetes mellitus (HCC) 08/08/2017  . Hypertension associated with diabetes (HCC) 08/08/2017    Ronn Melena, PT, DPT, NCS 10/15/2019, 2:03 PM  [facility] [address] [place], [place], [address] Phone: [phone]   Fax:  [phone]  Name: Jerry York MRN: [id number] Date of Birth: 07/13/1951

## 2019-10-16 ENCOUNTER — Other Ambulatory Visit: Payer: Self-pay

## 2019-10-16 ENCOUNTER — Ambulatory Visit: Payer: PPO

## 2019-10-16 DIAGNOSIS — R2689 Other abnormalities of gait and mobility: Secondary | ICD-10-CM | POA: Diagnosis not present

## 2019-10-16 DIAGNOSIS — M6281 Muscle weakness (generalized): Secondary | ICD-10-CM

## 2019-10-16 NOTE — Therapy (Signed)
[facility] [address] [place], [place], [address] Phone: [phone]   Fax:  [phone]  Physical Therapy Treatment  Patient Details  Name: Jerry York MRN: [id number] Date of Birth: 10/28/1951 Referring Provider (PT): Melven Sartorius, [place]   Encounter Date: 10/16/2019  PT End of Session - 10/16/19 1319    Visit Number  5    Number of Visits  11    Date for PT Re-Evaluation  11/29/19    PT Start Time  6433    PT Stop Time  1356    PT Time Calculation (min)  39 min    Activity Tolerance  Patient tolerated treatment well;No increased pain    Behavior During Therapy  WFL for tasks assessed/performed       Past Medical History:  Diagnosis Date  . Anxiety   . Bilateral renal cysts   . Gout    02-22-2017 acute right foot gout---  per pt resolved  . Gout   . Gross hematuria   . Heart murmur   . History of BPH 08/14/2017  . Hydronephrosis, left   . Hyperlipidemia   . Hypertension   . Renal insufficiency   . Rheumatoid arthritis ([place])   . Swelling   . Type 2 diabetes mellitus ([place])   . Urinary retention   . Wears glasses     Past Surgical History:  Procedure Laterality Date  . APPENDECTOMY  age 42  . CYSTOSCOPY WITH URETEROSCOPY AND STENT PLACEMENT Left 05/08/2017   Procedure: URETEROSCOPY AND STENT PLACEMENT;  Surgeon: Kathie Rhodes, MD;  Location: [facility];  Service: Urology;  Laterality: Left;  . CYSTOSCOPY/RETROGRADE/URETEROSCOPY Bilateral 05/08/2017   Procedure: CYSTOSCOPY/ BILATERAL RETROGRADE;  Surgeon: Kathie Rhodes, MD;  Location: [facility];  Service: Urology;  Laterality: Bilateral;  . HEMATOMA EVACUATION N/A 09/20/2019   Procedure: EVACUATION LUMBAR EPIDURAL HEMATOMA;  Surgeon: Newman Pies, MD;  Location: [facility];  Service: Neurosurgery;  Laterality: N/A;  EVACUATION LUMBAR EPIDURAL HEMATOMA  . INGUINAL HERNIA REPAIR Right 10/10/2000  . KNEE ARTHROSCOPY  Right 1980's  . LUMBAR LAMINECTOMY/DECOMPRESSION MICRODISCECTOMY N/A 09/16/2019   Procedure: Laminectomy and Foraminotomy - Lumbar Two-Lumbar Three - Lumbar Three-Lumbar Four - Lumbar Four-Lumbar Five - Lumbar Five-Sacral One;  Surgeon: Eustace Moore, MD;  Location: [facility] OR;  Service: Neurosurgery;  Laterality: N/A;  Laminectomy and Foraminotomy - Lumbar Two-Lumbar Three - Lumbar Three-Lumbar Four - Lumbar Four-Lumbar Five - Lumbar Five-Sacral One  . NASAL FRACTURE SURGERY     in highschool   . SHOULDER ARTHROSCOPY WITH OPEN ROTATOR CUFF REPAIR Right 1990's  . TRANSURETHRAL RESECTION OF PROSTATE      There were no vitals filed for this visit.  Subjective Assessment - 10/16/19 1318    Subjective  Pt reports he was a little sore after yesterday.    Pertinent History  68 y.o. right-handed male with history of HTN, RA, hyperlipidemia,DM, CKD stage II.  09/16/19 L2-S1 lumbar laminectomy, 9/25 lumbar reexploration with removal of epidural hematoma.    How long can you walk comfortably?  can walk comfortable about 30' but then tires out per patient report    Patient Stated Goals  Pt wants to get to moving more.    Currently in Pain?  Yes    Pain Score  5     Pain Location  Back    Pain Orientation  Lower;Left    Pain Descriptors / Indicators  Sore    Pain Type  Surgical  pain    Pain Onset  More than a month ago                       [facility] Adult PT Treatment/Exercise - 10/16/19 1327      Transfers   Comments  30 sec sit to stand=9 without UE support      Ambulation/Gait   Ambulation/Gait  Yes    Ambulation/Gait Assistance  5: Supervision    Ambulation Distance (Feet)  230 Feet    Assistive device  Straight cane    Gait Pattern  Step-through pattern;Trunk flexed    Gait Comments  Pt reported increased pain as he went on in left lower back with notable more antalgic steps with increased sway.      Neuro Re-ed    Neuro Re-ed Details   Standing in corner feet apart eyes closed  x 30 sec on pillow, standing on pillow with feet together x 30 sec, mini-squats on pillow with UE support x 10 with verbal cues for form, sidestepping along counter with UE support 6' x 6,  marching gait forward and then backwards gait 6' x 2. Pt needed seated rest break between activities to rest legs.      Exercises   Exercises  Lumbar      Lumbar Exercises: Supine   Bent Knee Raise  10 reps    Dead Bug  10 reps    Bridge  5 reps    Other Supine Lumbar Exercises  TA (transverse abdominus) contraction with unilateral hip ER x 5 each side in hooklying then bilateral hip ER x 10. Pt was given verbal cues to maintain TA contraction throughout exercises.             PT Education - 10/16/19 1404    Education Details  Pt to continue with current HEP    Person(s) Educated  Patient    Methods  Explanation    Comprehension  Verbalized understanding       PT Short Term Goals - 10/15/19 1359      PT SHORT TERM GOAL #1   Title  Pt will be instructed in proper body mechanics to protect back in order to prevent injury and be able to demonstrate compliance.    Baseline  Pt has been instructed in back safety including no bending/lifting/twisting. Pt knows not to bend over to get something off floor but to utilize reacher or squatting with legs if something light. Pt demos good log roll with activities.    Time  4    Period  Weeks    Status  Achieved    Target Date  11/01/19      PT SHORT TERM GOAL #2   Title  Pt will be independent with initial HEP for core stabilization, LE strengthening and balance to be safe to work on at home.    Time  4    Period  Weeks    Status  New    Target Date  11/01/19        PT Long Term Goals - 10/16/19 1405      PT LONG TERM GOAL #1   Title  Pt will be independent in progressive HEP to continue strength, balance and walking program on own.    Time  6    Period  Weeks    Status  New      PT LONG TERM GOAL #2   Title  Pt will increase gait speed  from 0.69ms to >0.883m for improved gait safety in community.    Baseline  0.6366mon 10/02/19    Time  6    Period  Weeks    Status  New      PT LONG TERM GOAL #3   Title  Pt will increase Berg Balance score from 43/56 to >47/56 for improved balance and decreased fall risk.    Baseline  43/56 on 10/02/19    Time  6    Period  Weeks    Status  New      PT LONG TERM GOAL #4   Title  Pt will increase 30 sec sit to stand from 6 reps to 8 or more without UE support for improved functional strength and endurance.    Baseline  6 on 10/02/2019. 9 on 10/16/19 meeting goal.    Time  6    Period  Weeks    Status  Achieved      PT LONG TERM GOAL #5   Title  Pt will ambulate >300' on varied surfaces with SPC versus no device mod I for improved community mobility.    Time  6    Period  Weeks    Status  New            Plan - 10/16/19 1406    Clinical Impression Statement  Pt increased gait distance today. Does get less steady as goes on. Met 30 sec sit to stand goal showing improving functional strength.    Personal Factors and Comorbidities  Comorbidity 3+    Comorbidities  HTN, RA, hyperlipidemia,DM, CKD stage II.    Examination-Activity Limitations  Locomotion Level;Transfers;Squat;Stairs;Stand;Sleep    Examination-Participation Restrictions  Community Activity;Cleaning;Shop;Yard Work    Stability/Clinical Decision Making  Evolving/Moderate complexity    Rehab Potential  Good    PT Frequency  2x / week   then 1x/week x 2 weeks   PT Duration  4 weeks    PT Treatment/Interventions  ADLs/Self Care Home Management;Therapeutic activities;Therapeutic exercise;Balance training;DME Instruction;Cryotherapy;Gait training;Stair training;Neuromuscular re-education;Patient/family education    PT Next Visit Plan  continue to work on core/LE strengthening, standing balance with vision removed, gait training with cane.    PT Home Exercise Plan  Access Code: [email or web address] Consulted and Agree with Plan  of Care  Patient       Patient will benefit from skilled therapeutic intervention in order to improve the following deficits and impairments:  Abnormal gait, Decreased balance, Decreased strength, Decreased mobility, Pain, Decreased endurance, Difficulty walking  Visit Diagnosis: Muscle weakness (generalized)  Other abnormalities of gait and mobility     Problem List Patient Active Problem List   Diagnosis Date Noted  . Neurogenic bladder 09/25/2019  . Epidural hematoma ([place]CC[place]9/29/2020  . S/P lumbar laminectomy 09/16/2019  . Lower extremity edema 12/11/2017  . Family history of premature CAD 11/27/2017  . 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 Type 2 diabetes mellitus ([place]8/15/2018  . Gout 08/09/2017  . Anxiety with insomnia 08/09/2017  . Hyperlipidemia associated with type 2 diabetes mellitus ([place]CC[place]8/14/2018  . Hypertension associated with diabetes ([place]CC[place]8/14/2018    EmiElecta SniffT, DPT, NCS 10/16/2019, 2:08 PM  [facility] [place]one: [phone]:  [phone]: DenTIMONTHY HOVATERN: [phone] of Birth: 3/204-Oct-1952

## 2019-10-17 NOTE — Telephone Encounter (Signed)
Patient has been re-enrolled into [employer]'s Gout - Medicare Access Program. Approval dates are 10/28/2019 through 10/26/2020  [id number] Group- [id number]  Will call and provide pharmacy updated info on Nov 2nd- when it is active.  1:38 PM Beatriz Chancellor, CPhT

## 2019-10-20 ENCOUNTER — Other Ambulatory Visit: Payer: Self-pay | Admitting: Family Medicine

## 2019-10-21 ENCOUNTER — Ambulatory Visit: Payer: PPO

## 2019-10-21 ENCOUNTER — Other Ambulatory Visit: Payer: Self-pay

## 2019-10-21 DIAGNOSIS — R2689 Other abnormalities of gait and mobility: Secondary | ICD-10-CM

## 2019-10-21 DIAGNOSIS — M6281 Muscle weakness (generalized): Secondary | ICD-10-CM

## 2019-10-21 NOTE — Therapy (Signed)
[facility] [address] [place], [place], [address] Phone: [phone]   Fax:  [phone]  Physical Therapy Treatment  Patient Details  Name: Jerry York MRN: [id number] Date of Birth: 04/29/1951 Referring Provider (PT): Eino Farberaniel Angiullli, [place]   Encounter Date: 10/21/2019  PT End of Session - 10/21/19 1316    Visit Number  6    Number of Visits  11    Date for PT Re-Evaluation  11/29/19    PT Start Time  1234    PT Stop Time  1315    PT Time Calculation (min)  41 min    Activity Tolerance  Patient tolerated treatment well;No increased pain    Behavior During Therapy  WFL for tasks assessed/performed       Past Medical History:  Diagnosis Date  . Anxiety   . Bilateral renal cysts   . Gout    02-22-2017 acute right foot gout---  per pt resolved  . Gout   . Gross hematuria   . Heart murmur   . History of BPH 08/14/2017  . Hydronephrosis, left   . Hyperlipidemia   . Hypertension   . Renal insufficiency   . Rheumatoid arthritis (HCC)   . Swelling   . Type 2 diabetes mellitus (HCC)   . Urinary retention   . Wears glasses     Past Surgical History:  Procedure Laterality Date  . APPENDECTOMY  age 68  . CYSTOSCOPY WITH URETEROSCOPY AND STENT PLACEMENT Left 05/08/2017   Procedure: URETEROSCOPY AND STENT PLACEMENT;  Surgeon: Ihor Gullyttelin, Mark, MD;  [facility];  Service: Urology;  Laterality: Left;  . CYSTOSCOPY/RETROGRADE/URETEROSCOPY Bilateral 05/08/2017   Procedure: CYSTOSCOPY/ BILATERAL RETROGRADE;  Surgeon: Ihor Gullyttelin, Mark, MD;  [facility];  Service: Urology;  Laterality: Bilateral;  . HEMATOMA EVACUATION N/A 09/20/2019   Procedure: EVACUATION LUMBAR EPIDURAL HEMATOMA;  Surgeon: Tressie StalkerJenkins, Jeffrey, MD;  [facility];  Service: Neurosurgery;  Laterality: N/A;  EVACUATION LUMBAR EPIDURAL HEMATOMA  . INGUINAL HERNIA REPAIR Right 10/10/2000  . KNEE ARTHROSCOPY  Right 1980's  . LUMBAR LAMINECTOMY/DECOMPRESSION MICRODISCECTOMY N/A 09/16/2019   Procedure: Laminectomy and Foraminotomy - Lumbar Two-Lumbar Three - Lumbar Three-Lumbar Four - Lumbar Four-Lumbar Five - Lumbar Five-Sacral One;  Surgeon: Tia AlertJones, David S, MD;  [facility];  Service: Neurosurgery;  Laterality: N/A;  Laminectomy and Foraminotomy - Lumbar Two-Lumbar Three - Lumbar Three-Lumbar Four - Lumbar Four-Lumbar Five - Lumbar Five-Sacral One  . NASAL FRACTURE SURGERY     in highschool   . SHOULDER ARTHROSCOPY WITH OPEN ROTATOR CUFF REPAIR Right 1990's  . TRANSURETHRAL RESECTION OF PROSTATE      There were no vitals filed for this visit.  Subjective Assessment - 10/21/19 1238    Subjective  Pt reports that he ran out of pain medicine last week and has been having some trouble sleeping since then.    Pertinent History  68 y.o. right-handed male with history of HTN, RA, hyperlipidemia,DM, CKD stage II.  09/16/19 L2-S1 lumbar laminectomy, 9/25 lumbar reexploration with removal of epidural hematoma.    How long can you walk comfortably?  can walk comfortable about 30' but then tires out per patient report    Patient Stated Goals  Pt wants to get to moving more.    Currently in Pain?  Yes    Pain Score  4     Pain Location  Back    Pain Orientation  Lower    Pain Descriptors /  Indicators  Aching    Pain Type  Surgical pain    Pain Onset  More than a month ago                       [facility] Adult PT Treatment/Exercise - 10/21/19 1249      Ambulation/Gait   Ambulation/Gait  Yes    Ambulation/Gait Assistance  5: Supervision    Ambulation Distance (Feet)  115 Feet    Assistive device  None    Gait Pattern  Step-through pattern;Trunk flexed    Ambulation Surface  Level;Indoor    Gait velocity  14.35 sec=0.69 m/s    Gait Comments  Pt reports increased pain in low back as goes on needing to rest.      Self-Care   Self-Care  Other Self-Care Comments    Other Self-Care  Comments   PT did discuss with patient to call his back surgeon if continues ot have trouble sleeping due to some pain at night as out of pain meds to see if there is anything else they would prescribe for night as that was the only time he took the pain meds before. Pt stated ok. PT also suggested putting pillow between knees at night as sleeping on side and that will keep spine in more neutral position. Pt verbalized understanding.      Neuro Re-ed    Neuro Re-ed Details   in // bars: tandem gait with light UE support x 3 laps, side stepping without UE support x 2 laps. Standing on blue airex with feet together without UE support x 30 sec eyes open and x 30 sec eyes  closed.      Exercises   Exercises  Other Exercises    Other Exercises   Mini-squats on airex x 10 with UE support and verbal cues for correct form and posture. Step ups on 4" step x 10 each LE with 1 UE support then repeated with lateral step ups x 10 each side. Nustep x 8 min level 5. Pt reported being a little winded after. HR=74 Pt needed seated rest breaks every other activity to rest back.             PT Education - 10/21/19 2022    Education Details  Pt to continue with current HEP    Person(s) Educated  Patient    Comprehension  Verbalized understanding       PT Short Term Goals - 10/15/19 1359      PT SHORT TERM GOAL #1   Title  Pt will be instructed in proper body mechanics to protect back in order to prevent injury and be able to demonstrate compliance.    Baseline  Pt has been instructed in back safety including no bending/lifting/twisting. Pt knows not to bend over to get something off floor but to utilize reacher or squatting with legs if something light. Pt demos good log roll with activities.    Time  4    Period  Weeks    Status  Achieved    Target Date  11/01/19      PT SHORT TERM GOAL #2   Title  Pt will be independent with initial HEP for core stabilization, LE strengthening and balance to be safe  to work on at home.    Time  4    Period  Weeks    Status  New    Target Date  11/01/19  PT Long Term Goals - 10/21/19 2023      PT LONG TERM GOAL #1   Title  Pt will be independent in progressive HEP to continue strength, balance and walking program on own.    Time  6    Period  Weeks    Status  New      PT LONG TERM GOAL #2   Title  Pt will increase gait speed from 0.56m/s to >0.87m/s for improved gait safety in community.    Baseline  0.31m/s on 10/02/19.0.67m/s on 10/21/19    Time  6    Period  Weeks    Status  On-going      PT LONG TERM GOAL #3   Title  Pt will increase Berg Balance score from 43/56 to >47/56 for improved balance and decreased fall risk.    Baseline  43/56 on 10/02/19    Time  6    Period  Weeks    Status  New      PT LONG TERM GOAL #4   Title  Pt will increase 30 sec sit to stand from 6 reps to 8 or more without UE support for improved functional strength and endurance.    Baseline  6 on 10/02/2019. 9 on 10/16/19 meeting goal.    Time  6    Period  Weeks    Status  Achieved      PT LONG TERM GOAL #5   Title  Pt will ambulate >300' on varied surfaces with SPC versus no device mod I for improved community mobility.    Time  6    Period  Weeks    Status  New            Plan - 10/21/19 2024    Clinical Impression Statement  Pt continues to need frequent rest breaks as back fatigues quickly with some increased pain. Pt increased gait speed some today. Pt needed cues to try to stand more erect with activities and brace tummy to help support back. Will benefit from continued PT to continue to improve strength, balance and gait.    Personal Factors and Comorbidities  Comorbidity 3+    Comorbidities  HTN, RA, hyperlipidemia,DM, CKD stage II.    Examination-Activity Limitations  Locomotion Level;Transfers;Squat;Stairs;Stand;Sleep    Examination-Participation Restrictions  Community Activity;Cleaning;Shop;Yard Work    Stability/Clinical Decision  Making  Evolving/Moderate complexity    Rehab Potential  Good    PT Frequency  2x / week   then 1x/week x 2 weeks   PT Duration  4 weeks    PT Treatment/Interventions  ADLs/Self Care Home Management;Therapeutic activities;Therapeutic exercise;Balance training;DME Instruction;Cryotherapy;Gait training;Stair training;Neuromuscular re-education;Patient/family education    PT Next Visit Plan  continue to work on core/LE strengthening, standing balance with vision removed, gait training with cane.    PT Home Exercise Plan  Access Code: [email or web address]    Consulted and Agree with Plan of Care  Patient       Patient will benefit from skilled therapeutic intervention in order to improve the following deficits and impairments:  Abnormal gait, Decreased balance, Decreased strength, Decreased mobility, Pain, Decreased endurance, Difficulty walking  Visit Diagnosis: Muscle weakness (generalized)  Other abnormalities of gait and mobility     Problem List Patient Active Problem List   Diagnosis Date Noted  . Neurogenic bladder 09/25/2019  . Epidural hematoma (HCC) 09/24/2019  . S/P lumbar laminectomy 09/16/2019  . Lower extremity edema 12/11/2017  . Family history of premature CAD 11/27/2017  .  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 Type 2 diabetes mellitus ([place]) 08/09/2017  . Gout 08/09/2017  . Anxiety with insomnia 08/09/2017  . Hyperlipidemia associated with type 2 diabetes mellitus ([place]) 08/08/2017  . Hypertension associated with diabetes ([place]) 08/08/2017    Electa Sniff, PT, DPT, NCS 10/21/2019, 8:27 PM  [facility] [address] [place], [place], [address] Phone: [phone]   Fax:  [phone]  Name: Jerry York MRN: [id number] Date of Birth: 1951/07/17

## 2019-10-22 ENCOUNTER — Ambulatory Visit: Payer: PPO

## 2019-10-22 DIAGNOSIS — M6281 Muscle weakness (generalized): Secondary | ICD-10-CM

## 2019-10-22 DIAGNOSIS — R2689 Other abnormalities of gait and mobility: Secondary | ICD-10-CM | POA: Diagnosis not present

## 2019-10-22 NOTE — Therapy (Signed)
[facility] [address] [place], [place], [address] Phone: [phone]   Fax:  [phone]  Physical Therapy Treatment  Patient Details  Name: Jerry York MRN: [id number] Date of Birth: September 16, 1951 Referring Provider (PT): Eino Farber, [name]   Encounter Date: 10/22/2019  PT End of Session - 10/22/19 1319    Visit Number  7    Number of Visits  11    Date for PT Re-Evaluation  11/29/19    PT Start Time  1316    PT Stop Time  1358    PT Time Calculation (min)  42 min    Activity Tolerance  Patient tolerated treatment well;No increased pain    Behavior During Therapy  WFL for tasks assessed/performed       Past Medical History:  Diagnosis Date  . Anxiety   . Bilateral renal cysts   . Gout    02-22-2017 acute right foot gout---  per pt resolved  . Gout   . Gross hematuria   . Heart murmur   . History of BPH 08/14/2017  . Hydronephrosis, left   . Hyperlipidemia   . Hypertension   . Renal insufficiency   . Rheumatoid arthritis (HCC)   . Swelling   . Type 2 diabetes mellitus (HCC)   . Urinary retention   . Wears glasses     Past Surgical History:  Procedure Laterality Date  . APPENDECTOMY  age 68  . CYSTOSCOPY WITH URETEROSCOPY AND STENT PLACEMENT Left 05/08/2017   Procedure: URETEROSCOPY AND STENT PLACEMENT;  Surgeon: Ihor Gully, MD;  Location: [facility];  Service: Urology;  Laterality: Left;  . CYSTOSCOPY/RETROGRADE/URETEROSCOPY Bilateral 05/08/2017   Procedure: CYSTOSCOPY/ BILATERAL RETROGRADE;  Surgeon: Ihor Gully, MD;  Location: [facility];  Service: Urology;  Laterality: Bilateral;  . HEMATOMA EVACUATION N/A 09/20/2019   Procedure: EVACUATION LUMBAR EPIDURAL HEMATOMA;  Surgeon: Tressie Stalker, MD;  Location: [facility] OR;  Service: Neurosurgery;  Laterality: N/A;  EVACUATION LUMBAR EPIDURAL HEMATOMA  . INGUINAL HERNIA REPAIR Right 10/10/2000  . KNEE ARTHROSCOPY  Right 1980's  . LUMBAR LAMINECTOMY/DECOMPRESSION MICRODISCECTOMY N/A 09/16/2019   Procedure: Laminectomy and Foraminotomy - Lumbar Two-Lumbar Three - Lumbar Three-Lumbar Four - Lumbar Four-Lumbar Five - Lumbar Five-Sacral One;  Surgeon: Tia Alert, MD;  Location: [facility] OR;  Service: Neurosurgery;  Laterality: N/A;  Laminectomy and Foraminotomy - Lumbar Two-Lumbar Three - Lumbar Three-Lumbar Four - Lumbar Four-Lumbar Five - Lumbar Five-Sacral One  . NASAL FRACTURE SURGERY     in highschool   . SHOULDER ARTHROSCOPY WITH OPEN ROTATOR CUFF REPAIR Right 1990's  . TRANSURETHRAL RESECTION OF PROSTATE      There were no vitals filed for this visit.  Subjective Assessment - 10/22/19 1319    Subjective  Pt reports he is doing ok today.    Pertinent History  68 y.o. right-handed male with history of HTN, RA, hyperlipidemia,DM, CKD stage II.  09/16/19 L2-S1 lumbar laminectomy, 9/25 lumbar reexploration with removal of epidural hematoma.    How long can you walk comfortably?  can walk comfortable about 30' but then tires out per patient report    Patient Stated Goals  Pt wants to get to moving more.    Currently in Pain?  Yes    Pain Score  3     Pain Location  Back    Pain Orientation  Lower    Pain Descriptors / Indicators  Aching    Pain Type  Surgical pain  Pain Onset  More than a month ago                       [facility] Adult PT Treatment/Exercise - 10/22/19 1321      Ambulation/Gait   Ambulation/Gait  Yes    Ambulation/Gait Assistance  5: Supervision    Ambulation/Gait Assistance Details  Pt was instructed  to switch cane to left side as he was leaning over right more with some trendelenberg as went on. This did decrease right lean some. Pt does have some increased right low back pain as goes on.    Ambulation Distance (Feet)  250 Feet    Assistive device  Straight cane    Gait Pattern  Step-through pattern;Trendelenburg;Antalgic   right   Ambulation Surface   Unlevel;Outdoor;Paved      Neuro Re-ed    Neuro Re-ed Details   Standing in corner on pillow: eyes open x 30 sec, eyes closed 30 sec x 2, head turns left/right x 10. Seated rest break then standing on pillow again with staggered stance 30 sec x 2 each position. Sidstepping along counter without UE support 6' x 6, marching gait with 1 UE support along counter 6' x 4. Pt favors right side more and reports some pulling. Instructed to brace tummy and only bring up what was tolerable. Pt had increased sway with eyes closed.      Exercises   Exercises  Other Exercises    Other Exercises   NuStep x 8 min level 6. Pt performed seated march x 10 with verbal cues for TA contraction x 10. Denied any increased pain.             PT Education - 10/22/19 1624    Education Details  Pt to continue with current HEP    Person(s) Educated  Patient    Methods  Explanation    Comprehension  Verbalized understanding       PT Short Term Goals - 10/15/19 1359      PT SHORT TERM GOAL #1   Title  Pt will be instructed in proper body mechanics to protect back in order to prevent injury and be able to demonstrate compliance.    Baseline  Pt has been instructed in back safety including no bending/lifting/twisting. Pt knows not to bend over to get something off floor but to utilize reacher or squatting with legs if something light. Pt demos good log roll with activities.    Time  4    Period  Weeks    Status  Achieved    Target Date  11/01/19      PT SHORT TERM GOAL #2   Title  Pt will be independent with initial HEP for core stabilization, LE strengthening and balance to be safe to work on at home.    Time  4    Period  Weeks    Status  New    Target Date  11/01/19        PT Long Term Goals - 10/21/19 2023      PT LONG TERM GOAL #1   Title  Pt will be independent in progressive HEP to continue strength, balance and walking program on own.    Time  6    Period  Weeks    Status  New      PT LONG  TERM GOAL #2   Title  Pt will increase gait speed from 0.47m/s to >0.41m/s for improved gait safety  in community.    Baseline  0.33m/s on 10/02/19.0.16m/s on 10/21/19    Time  6    Period  Weeks    Status  On-going      PT LONG TERM GOAL #3   Title  Pt will increase Berg Balance score from 43/56 to >47/56 for improved balance and decreased fall risk.    Baseline  43/56 on 10/02/19    Time  6    Period  Weeks    Status  New      PT LONG TERM GOAL #4   Title  Pt will increase 30 sec sit to stand from 6 reps to 8 or more without UE support for improved functional strength and endurance.    Baseline  6 on 10/02/2019. 9 on 10/16/19 meeting goal.    Time  6    Period  Weeks    Status  Achieved      PT LONG TERM GOAL #5   Title  Pt will ambulate >300' on varied surfaces with SPC versus no device mod I for improved community mobility.    Time  6    Period  Weeks    Status  New            Plan - 10/22/19 1625    Clinical Impression Statement  PT had patient switch cane to left side during gait outdoors as noted right trendelenberg. Pt did have some improvement with gait quality with this. Pt challenged with eyes closed balance activities that work vestibular system more.    Personal Factors and Comorbidities  Comorbidity 3+    Comorbidities  HTN, RA, hyperlipidemia,DM, CKD stage II.    Examination-Activity Limitations  Locomotion Level;Transfers;Squat;Stairs;Stand;Sleep    Examination-Participation Restrictions  Community Activity;Cleaning;Shop;Yard Work    Stability/Clinical Decision Making  Evolving/Moderate complexity    Rehab Potential  Good    PT Frequency  2x / week   then 1x/week x 2 weeks   PT Duration  4 weeks    PT Treatment/Interventions  ADLs/Self Care Home Management;Therapeutic activities;Therapeutic exercise;Balance training;DME Instruction;Cryotherapy;Gait training;Stair training;Neuromuscular re-education;Patient/family education    PT Next Visit Plan  continue to  work on core/LE strengthening, standing balance with vision removed, gait training with cane. Assess STG next week.    PT Home Exercise Plan  Access Code: [email or web address]    Consulted and Agree with Plan of Care  Patient       Patient will benefit from skilled therapeutic intervention in order to improve the following deficits and impairments:  Abnormal gait, Decreased balance, Decreased strength, Decreased mobility, Pain, Decreased endurance, Difficulty walking  Visit Diagnosis: Muscle weakness (generalized)  Other abnormalities of gait and mobility     Problem List Patient Active Problem List   Diagnosis Date Noted  . Neurogenic bladder 09/25/2019  . Epidural hematoma ([place]) 09/24/2019  . S/P lumbar laminectomy 09/16/2019  . Lower extremity edema 12/11/2017  . Family history of premature CAD 11/27/2017  . 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 Type 2 diabetes mellitus ([place]) 08/09/2017  . Gout 08/09/2017  . Anxiety with insomnia 08/09/2017  . Hyperlipidemia associated with type 2 diabetes mellitus ([place]) 08/08/2017  . Hypertension associated with diabetes ([place]) 08/08/2017    Electa Sniff, PT, DPT, NCS 10/22/2019, 4:28 PM  [facility] [address] [place], [place], [address] Phone: [phone]   Fax:  [phone]  Name: Jerry York MRN: [id number] Date of Birth: 1951-03-15

## 2019-10-28 ENCOUNTER — Other Ambulatory Visit: Payer: Self-pay

## 2019-10-28 ENCOUNTER — Ambulatory Visit: Payer: PPO | Attending: Physician Assistant | Admitting: Physical Therapy

## 2019-10-28 DIAGNOSIS — M6281 Muscle weakness (generalized): Secondary | ICD-10-CM | POA: Insufficient documentation

## 2019-10-28 DIAGNOSIS — R2689 Other abnormalities of gait and mobility: Secondary | ICD-10-CM | POA: Insufficient documentation

## 2019-10-28 NOTE — Therapy (Signed)
[facility] [address] [place], [place], [address] Phone: [phone]   Fax:  [phone]  Physical Therapy Treatment  Patient Details  Name: Jerry York MRN: [id number] Date of Birth: [birth year] Referring Provider (PT): Eino Farber, [name]   Encounter Date: 10/28/2019  PT End of Session - 10/28/19 1359    Visit Number  8    Number of Visits  11    Date for PT Re-Evaluation  11/29/19    PT Start Time  1315    PT Stop Time  1357    PT Time Calculation (min)  42 min    Activity Tolerance  Patient tolerated treatment well;No increased pain    Behavior During Therapy  WFL for tasks assessed/performed       Past Medical History:  Diagnosis Date  . Anxiety   . Bilateral renal cysts   . Gout    02-22-2017 acute right foot gout---  per pt resolved  . Gout   . Gross hematuria   . Heart murmur   . History of BPH 08/14/2017  . Hydronephrosis, left   . Hyperlipidemia   . Hypertension   . Renal insufficiency   . Rheumatoid arthritis (HCC)   . Swelling   . Type 2 diabetes mellitus (HCC)   . Urinary retention   . Wears glasses     Past Surgical History:  Procedure Laterality Date  . APPENDECTOMY  age 69  . CYSTOSCOPY WITH URETEROSCOPY AND STENT PLACEMENT Left 05/08/2017   Procedure: URETEROSCOPY AND STENT PLACEMENT;  Surgeon: Ihor Gully, MD;  Location: [facility];  Service: Urology;  Laterality: Left;  . CYSTOSCOPY/RETROGRADE/URETEROSCOPY Bilateral 05/08/2017   Procedure: CYSTOSCOPY/ BILATERAL RETROGRADE;  Surgeon: Ihor Gully, MD;  Location: [facility];  Service: Urology;  Laterality: Bilateral;  . HEMATOMA EVACUATION N/A 09/20/2019   Procedure: EVACUATION LUMBAR EPIDURAL HEMATOMA;  Surgeon: Tressie Stalker, MD;  Location: [facility] OR;  Service: Neurosurgery;  Laterality: N/A;  EVACUATION LUMBAR EPIDURAL HEMATOMA  . INGUINAL HERNIA REPAIR Right 10/10/2000  . KNEE ARTHROSCOPY  Right 1980's  . LUMBAR LAMINECTOMY/DECOMPRESSION MICRODISCECTOMY N/A 09/16/2019   Procedure: Laminectomy and Foraminotomy - Lumbar Two-Lumbar Three - Lumbar Three-Lumbar Four - Lumbar Four-Lumbar Five - Lumbar Five-Sacral One;  Surgeon: Tia Alert, MD;  Location: [facility] OR;  Service: Neurosurgery;  Laterality: N/A;  Laminectomy and Foraminotomy - Lumbar Two-Lumbar Three - Lumbar Three-Lumbar Four - Lumbar Four-Lumbar Five - Lumbar Five-Sacral One  . NASAL FRACTURE SURGERY     in highschool   . SHOULDER ARTHROSCOPY WITH OPEN ROTATOR CUFF REPAIR Right 1990's  . TRANSURETHRAL RESECTION OF PROSTATE      There were no vitals filed for this visit.  Subjective Assessment - 10/28/19 1326    Subjective  Relays no pain or new complaints today                       [facility] Adult PT Treatment/Exercise - 10/28/19 0001      Ambulation/Gait   Ambulation/Gait  Yes    Ambulation/Gait Assistance  5: Supervision    Ambulation Distance (Feet)  250 Feet    Assistive device  Straight cane    Gait Pattern  Step-through pattern;Trendelenburg;Antalgic    Ambulation Surface  Level;Indoor      Neuro Re-ed    Neuro Re-ed Details   in bars: walking with head turns lateral, up/down, sidestepping, and retrowalking all up/down X 3 laps in bars with no UE  support.  Standing eyes closed with feet together 20 sec X 3, balance on airex pad feet together eyes open 1 min, then step ups onto airex  X10 bilat with intermitt UE support as needed      Exercises   Other Exercises   NuStep x 8 min level 6. Pt performed seated march x 15 with verbal cues for TA contraction x 10. Sit to stands 2X10 no UE support, rows/ext with red X 15 ea (he was given band to take home to add these into POC               PT Short Term Goals - 10/15/19 1359      PT SHORT TERM GOAL #1   Title  Pt will be instructed in proper body mechanics to protect back in order to prevent injury and be able to demonstrate compliance.     Baseline  Pt has been instructed in back safety including no bending/lifting/twisting. Pt knows not to bend over to get something off floor but to utilize reacher or squatting with legs if something light. Pt demos good log roll with activities.    Time  4    Period  Weeks    Status  Achieved    Target Date  11/01/19      PT SHORT TERM GOAL #2   Title  Pt will be independent with initial HEP for core stabilization, LE strengthening and balance to be safe to work on at home.    Time  4    Period  Weeks    Status  New    Target Date  11/01/19        PT Long Term Goals - 10/21/19 2023      PT LONG TERM GOAL #1   Title  Pt will be independent in progressive HEP to continue strength, balance and walking program on own.    Time  6    Period  Weeks    Status  New      PT LONG TERM GOAL #2   Title  Pt will increase gait speed from 0.40m/s to >0.21m/s for improved gait safety in community.    Baseline  0.60m/s on 10/02/19.0.31m/s on 10/21/19    Time  6    Period  Weeks    Status  On-going      PT LONG TERM GOAL #3   Title  Pt will increase Berg Balance score from 43/56 to >47/56 for improved balance and decreased fall risk.    Baseline  43/56 on 10/02/19    Time  6    Period  Weeks    Status  New      PT LONG TERM GOAL #4   Title  Pt will increase 30 sec sit to stand from 6 reps to 8 or more without UE support for improved functional strength and endurance.    Baseline  6 on 10/02/2019. 9 on 10/16/19 meeting goal.    Time  6    Period  Weeks    Status  Achieved      PT LONG TERM GOAL #5   Title  Pt will ambulate >300' on varied surfaces with SPC versus no device mod I for improved community mobility.    Time  6    Period  Weeks    Status  New            Plan - 10/28/19 1359    Clinical Impression Statement  Session focused  on progression of balance, strengthening and conditioning. He does fatique easily and needs several rest breaks. Overall CGA to supervision with  higher level balance exercises in bars with intermitt UE support as needed. PT will continue to progress as able.    Personal Factors and Comorbidities  Comorbidity 3+    Comorbidities  HTN, RA, hyperlipidemia,DM, CKD stage II.    Examination-Activity Limitations  Locomotion Level;Transfers;Squat;Stairs;Stand;Sleep    Examination-Participation Restrictions  Community Activity;Cleaning;Shop;Yard Work    Stability/Clinical Decision Making  Evolving/Moderate complexity    Rehab Potential  Good    PT Frequency  2x / week   then 1x/week x 2 weeks   PT Duration  4 weeks    PT Treatment/Interventions  ADLs/Self Care Home Management;Therapeutic activities;Therapeutic exercise;Balance training;DME Instruction;Cryotherapy;Gait training;Stair training;Neuromuscular re-education;Patient/family education    PT Next Visit Plan  continue to work on core/LE strengthening, standing balance with vision removed, gait training with cane. Assess STG next week.    PT Home Exercise Plan  Access Code: [email or web address]    Consulted and Agree with Plan of Care  Patient       Patient will benefit from skilled therapeutic intervention in order to improve the following deficits and impairments:  Abnormal gait, Decreased balance, Decreased strength, Decreased mobility, Pain, Decreased endurance, Difficulty walking  Visit Diagnosis: Muscle weakness (generalized)  Other abnormalities of gait and mobility     Problem List Patient Active Problem List   Diagnosis Date Noted  . Neurogenic bladder 09/25/2019  . Epidural hematoma (HCC) 09/24/2019  . S/P lumbar laminectomy 09/16/2019  . Lower extremity edema 12/11/2017  . Family history of premature CAD 11/27/2017  . 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 Type 2 diabetes mellitus (HCC) 08/09/2017  . Gout 08/09/2017  . Anxiety with insomnia 08/09/2017  . Hyperlipidemia associated with type 2 diabetes mellitus (HCC) 08/08/2017  .  Hypertension associated with diabetes (HCC) 08/08/2017    Birdie Riddle 10/28/2019, 2:02 PM   [facility] [address] [place], [place], [address] Phone: [phone]   Fax:  [phone]  Name: Jerry York MRN: [id number] Date of Birth: 05-07-1951

## 2019-10-29 NOTE — Telephone Encounter (Signed)
Called Pharmacy and provided updated grant info.  10:09 AM Beatriz Chancellor, CPhT

## 2019-10-30 ENCOUNTER — Encounter: Payer: Self-pay | Admitting: Physical Therapy

## 2019-10-30 ENCOUNTER — Other Ambulatory Visit: Payer: Self-pay

## 2019-10-30 ENCOUNTER — Ambulatory Visit: Payer: PPO | Admitting: Physical Therapy

## 2019-10-30 DIAGNOSIS — R2689 Other abnormalities of gait and mobility: Secondary | ICD-10-CM

## 2019-10-30 DIAGNOSIS — M6281 Muscle weakness (generalized): Secondary | ICD-10-CM | POA: Diagnosis not present

## 2019-11-01 NOTE — Therapy (Signed)
[facility] [address] [place], [place], [address] Phone: [phone]   Fax:  [phone]  Physical Therapy Treatment  Patient Details  Name: Jerry York MRN: [id number] Date of Birth: 04-09-1951 Referring Provider (PT): Melven Sartorius, [place]   Encounter Date: 10/30/2019     10/30/19 1321  PT Visits / Re-Eval  Visit Number 9  Number of Visits 11  Date for PT Re-Evaluation 11/29/19  PT Time Calculation  PT Start Time 1318  PT Stop Time 1358  PT Time Calculation (min) 40 min  PT - End of Session  Activity Tolerance Patient tolerated treatment well;No increased pain  Behavior During Therapy WFL for tasks assessed/performed    Past Medical History:  Diagnosis Date  . Anxiety   . Bilateral renal cysts   . Gout    02-22-2017 acute right foot gout---  per pt resolved  . Gout   . Gross hematuria   . Heart murmur   . History of BPH 08/14/2017  . Hydronephrosis, left   . Hyperlipidemia   . Hypertension   . Renal insufficiency   . Rheumatoid arthritis ([place])   . Swelling   . Type 2 diabetes mellitus ([place])   . Urinary retention   . Wears glasses     Past Surgical History:  Procedure Laterality Date  . APPENDECTOMY  age 13  . CYSTOSCOPY WITH URETEROSCOPY AND STENT PLACEMENT Left 05/08/2017   Procedure: URETEROSCOPY AND STENT PLACEMENT;  Surgeon: Kathie Rhodes, MD;  Location: [facility];  Service: Urology;  Laterality: Left;  . CYSTOSCOPY/RETROGRADE/URETEROSCOPY Bilateral 05/08/2017   Procedure: CYSTOSCOPY/ BILATERAL RETROGRADE;  Surgeon: Kathie Rhodes, MD;  Location: [facility];  Service: Urology;  Laterality: Bilateral;  . HEMATOMA EVACUATION N/A 09/20/2019   Procedure: EVACUATION LUMBAR EPIDURAL HEMATOMA;  Surgeon: Newman Pies, MD;  Location: [place] [facility];  Service: Neurosurgery;  Laterality: N/A;  EVACUATION LUMBAR EPIDURAL HEMATOMA  . INGUINAL HERNIA REPAIR Right 10/10/2000  .  KNEE ARTHROSCOPY Right 1980's  . LUMBAR LAMINECTOMY/DECOMPRESSION MICRODISCECTOMY N/A 09/16/2019   Procedure: Laminectomy and Foraminotomy - Lumbar Two-Lumbar Three - Lumbar Three-Lumbar Four - Lumbar Four-Lumbar Five - Lumbar Five-Sacral One;  Surgeon: Eustace Moore, MD;  Location: [facility] OR;  Service: Neurosurgery;  Laterality: N/A;  Laminectomy and Foraminotomy - Lumbar Two-Lumbar Three - Lumbar Three-Lumbar Four - Lumbar Four-Lumbar Five - Lumbar Five-Sacral One  . NASAL FRACTURE SURGERY     in highschool   . SHOULDER ARTHROSCOPY WITH OPEN ROTATOR CUFF REPAIR Right 1990's  . TRANSURETHRAL RESECTION OF PROSTATE      There were no vitals filed for this visit.     10/30/19 1320  Symptoms/Limitations  Subjective No new complaints. No falls to report.  Pertinent History 68 y.o. right-handed male with history of HTN, RA, hyperlipidemia,DM, CKD stage II.  09/16/19 L2-S1 lumbar laminectomy, 9/25 lumbar reexploration with removal of epidural hematoma.  How long can you walk comfortably? can walk comfortable about 30' but then tires out per patient report  Patient Stated Goals Pt wants to get to moving more.  Pain Assessment  Currently in Pain? Yes  Pain Score 4  Pain Location Back  Pain Orientation Lower  Pain Descriptors / Indicators Aching  Pain Type Surgical pain  Pain Onset More than a month ago  Aggravating Factors  walking and standing  Pain Relieving Factors sitting and resting. out of muscle relaxers      10/30/19 1326  Transfers  Transfers Sit to Stand;Stand to  Sit  Sit to Stand 7: Independent  Stand to Sit 7: Independent  Ambulation/Gait  Ambulation/Gait Yes  Ambulation/Gait Assistance 5: Supervision  Ambulation/Gait Assistance Details pt used cane to ambulate into/out of gym. no device used in session.   Ambulation Distance (Feet) 115 Feet (x1, plus around gym with activity)  Assistive device Straight cane;None  Gait Pattern Step-through pattern;Trendelenburg;Antalgic   Ambulation Surface Level;Indoor  Neuro Re-ed   Neuro Re-ed Details  for balance/muscle re-ed/coordination: gait around track with enviromental scanning all directions no AD with min guard assist. cues on posture and arm swing with gait.;   Lumbar Exercises: Stretches  Active Hamstring Stretch Right;Left;3 reps;30 seconds;Limitations  Active Hamstring Stretch Limitations seated at edge of mat:   Lumbar Exercises: Aerobic  Nustep with UE/LE's: level 6 for 4 minutes, then level 7 for 4 minutes with >/=45 steps per minute for strengthening and activity tolerance. cues to maintain speed/pace needed.    Lumbar Exercises: Seated  Other Seated Lumbar Exercises seated on large pball: pelvic rocks laterally, then ant/post for 10 reps, pelvic circles both ways for 10 reps each way, alternating UE raises, long arc quads, alternating marching and ending with alternating combo contralateral UE/LE raises for 10 reps each. cues needed for form, posture and to slow down with movements. min guard to min assist for balance.         PT Short Term Goals - 10/30/19 1322      PT SHORT TERM GOAL #1   Title  Pt will be instructed in proper body mechanics to protect back in order to prevent injury and be able to demonstrate compliance.    Baseline  Pt has been instructed in back safety including no bending/lifting/twisting. Pt knows not to bend over to get something off floor but to utilize reacher or squatting with legs if something light. Pt demos good log roll with activities.10/30/19: pt able to verbalize all education provided to date.    Status  Achieved    Target Date  11/01/19      PT SHORT TERM GOAL #2   Title  Pt will be independent with initial HEP for core stabilization, LE strengthening and balance to be safe to work on at home.    Baseline  10/30/19: pt reports compliance with program with no issues.    Status  Achieved    Target Date  11/01/19        PT Long Term Goals - 10/21/19 2023      PT  LONG TERM GOAL #1   Title  Pt will be independent in progressive HEP to continue strength, balance and walking program on own.    Time  6    Period  Weeks    Status  New      PT LONG TERM GOAL #2   Title  Pt will increase gait speed from 0.5ms to >0.871m for improved gait safety in community.    Baseline  0.6322mon 10/02/19.0.6m19mn 10/21/19    Time  6    Period  Weeks    Status  On-going      PT LONG TERM GOAL #3   Title  Pt will increase Berg Balance score from 43/56 to >47/56 for improved balance and decreased fall risk.    Baseline  43/56 on 10/02/19    Time  6    Period  Weeks    Status  New      PT LONG TERM GOAL #4   Title  Pt will increase 30 sec sit to stand from 6 reps to 8 or more without UE support for improved functional strength and endurance.    Baseline  6 on 10/02/2019. 9 on 10/16/19 meeting goal.    Time  6    Period  Weeks    Status  Achieved      PT LONG TERM GOAL #5   Title  Pt will ambulate >300' on varied surfaces with SPC versus no device mod I for improved community mobility.    Time  6    Period  Weeks    Status  New         10/30/19 1322  Plan  Clinical Impression Statement Today's skilled session initially addressed STGs with all goals met, then continued to focus on stretching and strengthening with no issues reported, no change in pain. The pt is progressing well towards goals and should benefit from continued PT to progress toward unmet goals.  Personal Factors and Comorbidities Comorbidity 3+  Comorbidities HTN, RA, hyperlipidemia,DM, CKD stage II.  Examination-Activity Limitations Locomotion Level;Transfers;Squat;Stairs;Stand;Sleep  Examination-Participation Restrictions Community Activity;Cleaning;Shop;Yard Work  Pt will benefit from skilled therapeutic intervention in order to improve on the following deficits Abnormal gait;Decreased balance;Decreased strength;Decreased mobility;Pain;Decreased endurance;Difficulty walking   Stability/Clinical Decision Making Evolving/Moderate complexity  Rehab Potential Good  PT Frequency 2x / week (then 1x/week x 2 weeks)  PT Duration 4 weeks  PT Treatment/Interventions ADLs/Self Care Home Management;Therapeutic activities;Therapeutic exercise;Balance training;DME Instruction;Cryotherapy;Gait training;Stair training;Neuromuscular re-education;Patient/family education  PT Next Visit Plan continue to work on core/LE strengthening, standing balance with vision removed, gait training with cane.   PT Home Exercise Plan Access Code: [email or web address]  Consulted and Agree with Plan of Care Patient          Patient will benefit from skilled therapeutic intervention in order to improve the following deficits and impairments:  Abnormal gait, Decreased balance, Decreased strength, Decreased mobility, Pain, Decreased endurance, Difficulty walking  Visit Diagnosis: Muscle weakness (generalized)  Other abnormalities of gait and mobility     Problem List Patient Active Problem List   Diagnosis Date Noted  . Neurogenic bladder 09/25/2019  . Epidural hematoma ([place]) 09/24/2019  . S/P lumbar laminectomy 09/16/2019  . Lower extremity edema 12/11/2017  . Family history of premature CAD 11/27/2017  . 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 Type 2 diabetes mellitus ([place]) 08/09/2017  . Gout 08/09/2017  . Anxiety with insomnia 08/09/2017  . Hyperlipidemia associated with type 2 diabetes mellitus ([place]) 08/08/2017  . Hypertension associated with diabetes ([place]) 08/08/2017    Willow Ora, PTA, [facility] [address], [place], [place] [address] [phone] 11/01/19, 7:25 AM   Name: Jerry York MRN: [id number] Date of Birth: [birth year]

## 2019-11-11 ENCOUNTER — Ambulatory Visit: Payer: PPO

## 2019-11-11 ENCOUNTER — Other Ambulatory Visit: Payer: Self-pay

## 2019-11-11 DIAGNOSIS — M6281 Muscle weakness (generalized): Secondary | ICD-10-CM | POA: Diagnosis not present

## 2019-11-11 DIAGNOSIS — R2689 Other abnormalities of gait and mobility: Secondary | ICD-10-CM

## 2019-11-11 NOTE — Therapy (Signed)
[facility] [address] [place], [place], [address] Phone: [phone]   Fax:  [phone]  Physical Therapy Treatment/10th visit progress note  Patient Details  Name: Jerry York MRN: [id number] Date of Birth: 02-26-1951 Referring Provider (PT): Eino Farber, PA    Progress Note  Reporting period 10/02/2019 to 11/11/2019  See Note below for Objective Data and Assessment of Progress/Goals  Encounter Date: 11/11/2019  PT End of Session - 11/11/19 1318    Visit Number  10    Number of Visits  11    Date for PT Re-Evaluation  11/29/19    PT Start Time  1316    PT Stop Time  1355    PT Time Calculation (min)  39 min    Activity Tolerance  Patient tolerated treatment well;No increased pain    Behavior During Therapy  WFL for tasks assessed/performed       Past Medical History:  Diagnosis Date  . Anxiety   . Bilateral renal cysts   . Gout    02-22-2017 acute right foot gout---  per pt resolved  . Gout   . Gross hematuria   . Heart murmur   . History of BPH 08/14/2017  . Hydronephrosis, left   . Hyperlipidemia   . Hypertension   . Renal insufficiency   . Rheumatoid arthritis ([facility])   . Swelling   . Type 2 diabetes mellitus ([facility])   . Urinary retention   . Wears glasses     Past Surgical History:  Procedure Laterality Date  . APPENDECTOMY  age 20  . CYSTOSCOPY WITH URETEROSCOPY AND STENT PLACEMENT Left 05/08/2017   Procedure: URETEROSCOPY AND STENT PLACEMENT;  Surgeon: Ihor Gully, MD;  Location: [facility];  Service: Urology;  Laterality: Left;  . CYSTOSCOPY/RETROGRADE/URETEROSCOPY Bilateral 05/08/2017   Procedure: CYSTOSCOPY/ BILATERAL RETROGRADE;  Surgeon: Ihor Gully, MD;  Location: [facility];  Service: Urology;  Laterality: Bilateral;  . HEMATOMA EVACUATION N/A 09/20/2019   Procedure: EVACUATION LUMBAR EPIDURAL HEMATOMA;  Surgeon: Tressie Stalker, MD;   Location: [facility];  Service: Neurosurgery;  Laterality: N/A;  EVACUATION LUMBAR EPIDURAL HEMATOMA  . INGUINAL HERNIA REPAIR Right 10/10/2000  . KNEE ARTHROSCOPY Right 1980's  . LUMBAR LAMINECTOMY/DECOMPRESSION MICRODISCECTOMY N/A 09/16/2019   Procedure: Laminectomy and Foraminotomy - Lumbar Two-Lumbar Three - Lumbar Three-Lumbar Four - Lumbar Four-Lumbar Five - Lumbar Five-Sacral One;  Surgeon: Tia Alert, MD;  Location: [facility];  Service: Neurosurgery;  Laterality: N/A;  Laminectomy and Foraminotomy - Lumbar Two-Lumbar Three - Lumbar Three-Lumbar Four - Lumbar Four-Lumbar Five - Lumbar Five-Sacral One  . NASAL FRACTURE SURGERY     in highschool   . SHOULDER ARTHROSCOPY WITH OPEN ROTATOR CUFF REPAIR Right 1990's  . TRANSURETHRAL RESECTION OF PROSTATE      There were no vitals filed for this visit.                    [facility] Adult PT Treatment/Exercise - 11/11/19 1323      Ambulation/Gait   Ambulation/Gait  Yes    Ambulation/Gait Assistance  5: Supervision    Ambulation/Gait Assistance Details  Pt ambulated 1 min 11 sec without AD before needing to sit and rest due to back tired and pain up to 6/10    Ambulation Distance (Feet)  140 Feet    Assistive device  None    Gait Pattern  Step-through pattern;Trunk flexed    Ambulation Surface  Level;Indoor  Gait velocity  0.2056m/s    Gait Comments  Pt ambulated 575' with rollator mod I with improved posture and step length for 3 min 40 sec.  Pt denied any increased back pain with rollator and did not wear out as quick.       Standardized Balance Assessment   Standardized Balance Assessment  Berg Balance Test      Berg Balance Test   Sit to Stand  Able to stand without using hands and stabilize independently    Standing Unsupported  Able to stand safely 2 minutes    Sitting with Back Unsupported but Feet Supported on Floor or Stool  Able to sit safely and securely 2 minutes    Stand to Sit  Sits safely with minimal use of hands     Transfers  Able to transfer safely, minor use of hands    Standing Unsupported with Eyes Closed  Able to stand 10 seconds with supervision    Standing Ubsupported with Feet Together  Able to place feet together independently and stand 1 minute safely    From Standing, Reach Forward with Outstretched Arm  Can reach forward >12 cm safely (5")    From Standing Position, Pick up Object from Floor  Able to pick up shoe, needs supervision    From Standing Position, Turn to Look Behind Over each Shoulder  Turn sideways only but maintains balance    Turn 360 Degrees  Able to turn 360 degrees safely but slowly    Standing Unsupported, Alternately Place Feet on Step/Stool  Able to stand independently and safely and complete 8 steps in 20 seconds    Standing Unsupported, One Foot in Front  Able to plae foot ahead of the other independently and hold 30 seconds    Standing on One Leg  Tries to lift leg/unable to hold 3 seconds but remains standing independently    Total Score  45      Neuro Re-ed    Neuro Re-ed Details   Along counter: tandem gait 6' x 2, marching gait 6' x 2, side stepping 6' x 2 with 1 UE support. Verbal cues to tighten core throughout      Exercises   Exercises  Other Exercises    Other Exercises   mini-squats x 10 with verbal cues for correct form. Placed chair behind to help with cuing.              PT Education - 11/11/19 1554    Education Details  Pt to continue with current HEP    Person(s) Educated  Patient    Methods  Explanation    Comprehension  Verbalized understanding       PT Short Term Goals - 11/11/19 1320      PT SHORT TERM GOAL #1   Title  Pt will be instructed in proper body mechanics to protect back in order to prevent injury and be able to demonstrate compliance.    Baseline  Pt has been instructed in back safety including no bending/lifting/twisting. Pt knows not to bend over to get something off floor but to utilize reacher or squatting with legs  if something light. Pt demos good log roll with activities.10/30/19: pt able to verbalize all education provided to date.    Status  Achieved    Target Date  11/01/19      PT SHORT TERM GOAL #2   Title  Pt will be independent with initial HEP for core stabilization, LE strengthening and  balance to be safe to work on at home.    Baseline  10/30/19: pt reports compliance with program with no issues.    Status  Achieved    Target Date  11/01/19        PT Long Term Goals - 11/11/19 1320      PT LONG TERM GOAL #1   Title  Pt will be independent in progressive HEP to continue strength, balance and walking program on own.    Time  6    Period  Weeks    Status  New      PT LONG TERM GOAL #2   Title  Pt will increase gait speed from 0.64m/s to >0.6m/s for improved gait safety in community.    Baseline  0.69m/s on 11/11/2019    Time  6    Period  Weeks    Status  On-going      PT LONG TERM GOAL #3   Title  Pt will increase Berg Balance score from 43/56 to >47/56 for improved balance and decreased fall risk.    Baseline  45/56 on 11/11/2019    Time  6    Period  Weeks    Status  On-going      PT LONG TERM GOAL #4   Title  Pt will increase 30 sec sit to stand from 6 reps to 8 or more without UE support for improved functional strength and endurance.    Baseline  6 on 10/02/2019. 9 on 10/16/19 meeting goal.    Time  6    Period  Weeks    Status  Achieved      PT LONG TERM GOAL #5   Title  Pt will ambulate >300' on varied surfaces with SPC versus no device mod I for improved community mobility.    Time  6    Period  Weeks    Status  New            Plan - 11/11/19 1554    Clinical Impression Statement  Pt showed improvement in gait distance when utilized rollator at visit today. Decreased back pain as gave him more support. Pt had improved cadence as well. Would benefit from rollator for community. Pt increased gait speed and Berg score just short of goal. Pt will benefit from  continued PT to continue to progress gait, core/LE strength and balance.    Personal Factors and Comorbidities  Comorbidity 3+    Comorbidities  HTN, RA, hyperlipidemia,DM, CKD stage II.    Examination-Activity Limitations  Locomotion Level;Transfers;Squat;Stairs;Stand;Sleep    Examination-Participation Restrictions  Community Activity;Cleaning;Shop;Yard Work    Stability/Clinical Decision Making  Evolving/Moderate complexity    Rehab Potential  Good    PT Frequency  2x / week   then 1x/week x 2 weeks   PT Duration  4 weeks    PT Treatment/Interventions  ADLs/Self Care Home Management;Therapeutic activities;Therapeutic exercise;Balance training;DME Instruction;Cryotherapy;Gait training;Stair training;Neuromuscular re-education;Patient/family education    PT Next Visit Plan  Recert next visit. continue to work on core/LE strengthening, standing balance with vision removed, gait training with cane.    PT Home Exercise Plan  Access Code: [email or web address]    Consulted and Agree with Plan of Care  Patient       Patient will benefit from skilled therapeutic intervention in order to improve the following deficits and impairments:  Abnormal gait, Decreased balance, Decreased strength, Decreased mobility, Pain, Decreased endurance, Difficulty walking  Visit Diagnosis: Muscle weakness (generalized)  Other abnormalities of  gait and mobility     Problem List Patient Active Problem List   Diagnosis Date Noted  . Neurogenic bladder 09/25/2019  . Epidural hematoma ([place]) 09/24/2019  . S/P lumbar laminectomy 09/16/2019  . Lower extremity edema 12/11/2017  . Family history of premature CAD 11/27/2017  . 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 Type 2 diabetes mellitus ([place]) 08/09/2017  . Gout 08/09/2017  . Anxiety with insomnia 08/09/2017  . Hyperlipidemia associated with type 2 diabetes mellitus ([place]) 08/08/2017  . Hypertension associated with diabetes ([place])  08/08/2017    Electa Sniff, PT, DPT, NCS 11/11/2019, 3:56 PM  [facility] [address] [place], [place], [address] Phone: [phone]   Fax:  [phone]  Name: Jerry York MRN: [id number] Date of Birth: 02-17-1951

## 2019-11-14 DIAGNOSIS — Z5181 Encounter for therapeutic drug level monitoring: Secondary | ICD-10-CM | POA: Diagnosis not present

## 2019-11-14 DIAGNOSIS — E785 Hyperlipidemia, unspecified: Secondary | ICD-10-CM | POA: Diagnosis not present

## 2019-11-14 DIAGNOSIS — Z794 Long term (current) use of insulin: Secondary | ICD-10-CM | POA: Diagnosis not present

## 2019-11-14 DIAGNOSIS — E1165 Type 2 diabetes mellitus with hyperglycemia: Secondary | ICD-10-CM | POA: Diagnosis not present

## 2019-11-14 LAB — HEMOGLOBIN A1C: Hemoglobin A1C: 6.9

## 2019-11-18 ENCOUNTER — Other Ambulatory Visit: Payer: Self-pay

## 2019-11-18 ENCOUNTER — Ambulatory Visit: Payer: PPO

## 2019-11-18 DIAGNOSIS — M6281 Muscle weakness (generalized): Secondary | ICD-10-CM | POA: Diagnosis not present

## 2019-11-18 DIAGNOSIS — R2689 Other abnormalities of gait and mobility: Secondary | ICD-10-CM

## 2019-11-18 NOTE — Therapy (Signed)
[facility] [address] [place], [place], [address] Phone: [phone]   Fax:  [phone]  Physical Therapy Treatment/Recert  Patient Details  Name: Jerry York MRN: [id number] Date of Birth: 1951-07-26 Referring Provider (PT): Eino Farber, [name]   Encounter Date: 11/18/2019  PT End of Session - 11/18/19 1319    Visit Number  11    Number of Visits  19    Date for PT Re-Evaluation  01/17/2020    PT Start Time  1316    PT Stop Time  1355    PT Time Calculation (min)  39 min    Activity Tolerance  Patient tolerated treatment well;No increased pain    Behavior During Therapy  WFL for tasks assessed/performed       Past Medical History:  Diagnosis Date  . Anxiety   . Bilateral renal cysts   . Gout    02-22-2017 acute right foot gout---  per pt resolved  . Gout   . Gross hematuria   . Heart murmur   . History of BPH 08/14/2017  . Hydronephrosis, left   . Hyperlipidemia   . Hypertension   . Renal insufficiency   . Rheumatoid arthritis (HCC)   . Swelling   . Type 2 diabetes mellitus (HCC)   . Urinary retention   . Wears glasses     Past Surgical History:  Procedure Laterality Date  . APPENDECTOMY  age 68  . CYSTOSCOPY WITH URETEROSCOPY AND STENT PLACEMENT Left 05/08/2017   Procedure: URETEROSCOPY AND STENT PLACEMENT;  Surgeon: Ihor Gully, MD;  Location: [facility];  Service: Urology;  Laterality: Left;  . CYSTOSCOPY/RETROGRADE/URETEROSCOPY Bilateral 05/08/2017   Procedure: CYSTOSCOPY/ BILATERAL RETROGRADE;  Surgeon: Ihor Gully, MD;  Location: [facility];  Service: Urology;  Laterality: Bilateral;  . HEMATOMA EVACUATION N/A 09/20/2019   Procedure: EVACUATION LUMBAR EPIDURAL HEMATOMA;  Surgeon: Tressie Stalker, MD;  Location: [facility] OR;  Service: Neurosurgery;  Laterality: N/A;  EVACUATION LUMBAR EPIDURAL HEMATOMA  . INGUINAL HERNIA REPAIR Right 10/10/2000  . KNEE  ARTHROSCOPY Right 1980's  . LUMBAR LAMINECTOMY/DECOMPRESSION MICRODISCECTOMY N/A 09/16/2019   Procedure: Laminectomy and Foraminotomy - Lumbar Two-Lumbar Three - Lumbar Three-Lumbar Four - Lumbar Four-Lumbar Five - Lumbar Five-Sacral One;  Surgeon: Tia Alert, MD;  Location: [facility] OR;  Service: Neurosurgery;  Laterality: N/A;  Laminectomy and Foraminotomy - Lumbar Two-Lumbar Three - Lumbar Three-Lumbar Four - Lumbar Four-Lumbar Five - Lumbar Five-Sacral One  . NASAL FRACTURE SURGERY     in highschool   . SHOULDER ARTHROSCOPY WITH OPEN ROTATOR CUFF REPAIR Right 1990's  . TRANSURETHRAL RESECTION OF PROSTATE      There were no vitals filed for this visit.  Subjective Assessment - 11/18/19 1321    Subjective  Pt reports he is doing well.    Pertinent History  68 y.o. right-handed male with history of HTN, RA, hyperlipidemia,DM, CKD stage II.  09/16/19 L2-S1 lumbar laminectomy, 9/25 lumbar reexploration with removal of epidural hematoma.    How long can you walk comfortably?  can walk comfortable about 30' but then tires out per patient report    Patient Stated Goals  Pt wants to get to moving more.    Currently in Pain?  Yes    Pain Score  4     Pain Location  Back    Pain Orientation  Lower    Pain Descriptors / Indicators  Aching    Pain Type  Surgical pain;Chronic pain  Pain Onset  More than a month ago    Pain Frequency  Constant                       [facility] Adult PT Treatment/Exercise - 11/18/19 1324      Ambulation/Gait   Ambulation/Gait  Yes    Ambulation/Gait Assistance  5: Supervision    Ambulation/Gait Assistance Details  as goes on patient develops more antalgic gait on right. Pain up to 6/10 in low back.    Ambulation Distance (Feet)  230 Feet    Assistive device  None    Gait Pattern  Step-through pattern;Antalgic    Ambulation Surface  Level;Indoor    Ramp  7: Independent   x 2 without AD   Curb  7: Independent   x 2 without AD   Gait Comments  Pt  ambulated outside with rollator 850' over cement and grass supervision with cues for upright posture. Pt reported less pain in back with rollator.      Neuro Re-ed    Neuro Re-ed Details   Tandem gait in // bar with 1 UE support. Standing on airex feet apart eyes closed x 30 sec then with head turns up/down and left right x 10 eyes open close SBA/CGA. Pt cued to tighten TA throughout. Marching on airex x 10 bilateral.      Exercises   Exercises  Other Exercises    Other Exercises   Core stab on green theraball: alternating shoulder flexion x 10 with TA (transverse abdominus contraction, marching x 10 bilateral, LAQ x 10 bilateral. Sit to stand from theraball without UE support x 5. Maintaining balance on green theraball with PT providing external pertubation x 30 sec.              PT Education - 11/18/19 1817    Education Details  Pt to continue with current HEP    Person(s) Educated  Patient    Methods  Explanation    Comprehension  Verbalized understanding       PT Short Term Goals - 11/11/19 1320      PT SHORT TERM GOAL #1   Title  Pt will be instructed in proper body mechanics to protect back in order to prevent injury and be able to demonstrate compliance.    Baseline  Pt has been instructed in back safety including no bending/lifting/twisting. Pt knows not to bend over to get something off floor but to utilize reacher or squatting with legs if something light. Pt demos good log roll with activities.10/30/19: pt able to verbalize all education provided to date.    Status  Achieved    Target Date  11/01/19      PT SHORT TERM GOAL #2   Title  Pt will be independent with initial HEP for core stabilization, LE strengthening and balance to be safe to work on at home.    Baseline  10/30/19: pt reports compliance with program with no issues.    Status  Achieved    Target Date  11/01/19        PT Long Term Goals - 11/18/19 1821      PT LONG TERM GOAL #1   Title  Pt will be  independent in progressive HEP to continue strength, balance and walking program on own.    Baseline  Pt is performing initial strengthening HEP.    Time  6    Period  Weeks    Status  On-going  PT LONG TERM GOAL #2   Title  Pt will increase gait speed from 0.48671m/s to >0.7671m/s for improved gait safety in community.    Baseline  0.7237m/s on 11/11/2019    Time  6    Period  Weeks    Status  On-going      PT LONG TERM GOAL #3   Title  Pt will increase Berg Balance score from 43/56 to >47/56 for improved balance and decreased fall risk.    Baseline  45/56 on 11/11/2019    Time  6    Period  Weeks    Status  On-going      PT LONG TERM GOAL #4   Title  Pt will increase 30 sec sit to stand from 6 reps to 8 or more without UE support for improved functional strength and endurance.    Baseline  6 on 10/02/2019. 9 on 10/16/19 meeting goal.    Time  6    Period  Weeks    Status  Achieved      PT LONG TERM GOAL #5   Title  Pt will ambulate >300' on varied surfaces with SPC versus no device mod I for improved community mobility.    Baseline  230' without AD on level surfaces supervision.    Time  6    Period  Weeks    Status  On-going       Updated goals: PT Short Term Goals - 11/18/19 1829      PT SHORT TERM GOAL #1   Title  Pt will be independent with gait on varied surfaces with rollator for improved community mobility.    Time  4    Period  Weeks    Status  New    Target Date  12/18/19      PT SHORT TERM GOAL #2   Target Date          PT Long Term Goals - 11/18/19 1831      PT LONG TERM GOAL #1   Title  Pt will be independent in progressive HEP to continue strength, balance and walking program on own.    Time  8    Period  Weeks    Status  On-going    Target Date  01/17/20      PT LONG TERM GOAL #2   Title  Pt will increase gait speed from 0.23671m/s to >0.271m/s for improved gait safety in community.    Baseline  0.7337m/s on 11/11/2019    Time  8    Period  Weeks     Status  On-going    Target Date  01/17/20      PT LONG TERM GOAL #3   Title  Pt will increase Berg Balance score from 43/56 to >47/56 for improved balance and decreased fall risk.    Baseline  45/56 on 11/11/2019    Time  8    Period  Weeks    Status  On-going    Target Date  01/17/20      PT LONG TERM GOAL #4   Title  Pt will report <5/10 pain in back with functional activities for improved mobility.    Time  8    Period  Weeks    Status  New    Target Date  01/17/20      PT LONG TERM GOAL #5   Title  Pt will ambulate >300' on varied surfaces with SPC versus no device mod I for  improved community mobility.    Time  8    Period  Weeks    Status  On-going    Target Date  01/17/20          Plan - 11/18/19 1823    Clinical Impression Statement  Pt was able to progress gait outside today with rollator with less back pain as provides support to make up for weakness in core. Pt reports that he has ordered rollator and is on the way. Pt has shown good progress towards goals but short of reaching Berg, gait speed and gait goals still. He will benefit from continued PT to continue to work on balance, core/LE strength and gait training.    Personal Factors and Comorbidities  Comorbidity 3+    Comorbidities  HTN, RA, hyperlipidemia,DM, CKD stage II.    Examination-Activity Limitations  Locomotion Level;Transfers;Squat;Stairs;Stand;Sleep    Examination-Participation Restrictions  Community Activity;Cleaning;Shop;Yard Work    [employer]chant [employer] officer  Evolving/Moderate complexity    Rehab Potential  Good    PT Frequency  1x / week    PT Duration  8 weeks    PT Treatment/Interventions  ADLs/Self Care Home Management;Therapeutic activities;Therapeutic exercise;Balance training;DME Instruction;Cryotherapy;Gait training;Stair training;Neuromuscular re-education;Patient/family education;Manual techniques    PT Next Visit Plan  continue to work on core/LE strengthening, standing  balance with vision removed, gait training.    PT Home Exercise Plan  Access Code: [email or web address]    Consulted and Agree with Plan of Care  Patient       Patient will benefit from skilled therapeutic intervention in order to improve the following deficits and impairments:  Abnormal gait, Decreased balance, Decreased strength, Decreased mobility, Pain, Decreased endurance, Difficulty walking  Visit Diagnosis: Muscle weakness (generalized)  Other abnormalities of gait and mobility     Problem List Patient Active Problem List   Diagnosis Date Noted  . Neurogenic bladder 09/25/2019  . Epidural hematoma ([place]) 09/24/2019  . S/P lumbar laminectomy 09/16/2019  . Lower extremity edema 12/11/2017  . Family history of premature CAD 11/27/2017  . 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 Type 2 diabetes mellitus ([place]) 08/09/2017  . Gout 08/09/2017  . Anxiety with insomnia 08/09/2017  . Hyperlipidemia associated with type 2 diabetes mellitus ([place]) 08/08/2017  . Hypertension associated with diabetes ([place]) 08/08/2017    Electa Sniff, PT, DPT, NCS 11/18/2019, 6:29 PM  [facility] [address] [place], [place], [address] Phone: [phone]   Fax:  [phone]  Name: VALERIAN JEWEL MRN: [id number] Date of Birth: 06/28/1951

## 2019-11-28 ENCOUNTER — Ambulatory Visit: Payer: PPO | Attending: Physician Assistant

## 2019-11-28 ENCOUNTER — Other Ambulatory Visit: Payer: Self-pay

## 2019-11-28 DIAGNOSIS — M6281 Muscle weakness (generalized): Secondary | ICD-10-CM | POA: Insufficient documentation

## 2019-11-28 DIAGNOSIS — R2689 Other abnormalities of gait and mobility: Secondary | ICD-10-CM | POA: Insufficient documentation

## 2019-11-28 NOTE — Therapy (Signed)
[facility] [address] [place], [place], [address] Phone: [phone]   Fax:  [phone]  Physical Therapy Treatment  Patient Details  Name: Jerry York MRN: [id number] Date of Birth: 1951-02-22 Referring Provider (PT): Eino Farber, [name]   Encounter Date: 11/28/2019  PT End of Session - 11/28/19 1537    Visit Number  12    Number of Visits  19    Date for PT Re-Evaluation  01/17/20    PT Start Time  1534    PT Stop Time  1612    PT Time Calculation (min)  38 min    Activity Tolerance  Patient tolerated treatment well;No increased pain    Behavior During Therapy  WFL for tasks assessed/performed       Past Medical History:  Diagnosis Date  . Anxiety   . Bilateral renal cysts   . Gout    02-22-2017 acute right foot gout---  per pt resolved  . Gout   . Gross hematuria   . Heart murmur   . History of BPH 08/14/2017  . Hydronephrosis, left   . Hyperlipidemia   . Hypertension   . Renal insufficiency   . Rheumatoid arthritis (HCC)   . Swelling   . Type 2 diabetes mellitus (HCC)   . Urinary retention   . Wears glasses     Past Surgical History:  Procedure Laterality Date  . APPENDECTOMY  age 23  . CYSTOSCOPY WITH URETEROSCOPY AND STENT PLACEMENT Left 05/08/2017   Procedure: URETEROSCOPY AND STENT PLACEMENT;  Surgeon: Ihor Gully, MD;  Location: [facility];  Service: Urology;  Laterality: Left;  . CYSTOSCOPY/RETROGRADE/URETEROSCOPY Bilateral 05/08/2017   Procedure: CYSTOSCOPY/ BILATERAL RETROGRADE;  Surgeon: Ihor Gully, MD;  Location: [facility];  Service: Urology;  Laterality: Bilateral;  . HEMATOMA EVACUATION N/A 09/20/2019   Procedure: EVACUATION LUMBAR EPIDURAL HEMATOMA;  Surgeon: Tressie Stalker, MD;  [facility] OR;  Service: Neurosurgery;  Laterality: N/A;  EVACUATION LUMBAR EPIDURAL HEMATOMA  . INGUINAL HERNIA REPAIR Right 10/10/2000  . KNEE ARTHROSCOPY  Right 1980's  . LUMBAR LAMINECTOMY/DECOMPRESSION MICRODISCECTOMY N/A 09/16/2019   Procedure: Laminectomy and Foraminotomy - Lumbar Two-Lumbar Three - Lumbar Three-Lumbar Four - Lumbar Four-Lumbar Five - Lumbar Five-Sacral One;  Surgeon: Tia Alert, MD;  Location: [facility] OR;  Service: Neurosurgery;  Laterality: N/A;  Laminectomy and Foraminotomy - Lumbar Two-Lumbar Three - Lumbar Three-Lumbar Four - Lumbar Four-Lumbar Five - Lumbar Five-Sacral One  . NASAL FRACTURE SURGERY     in highschool   . SHOULDER ARTHROSCOPY WITH OPEN ROTATOR CUFF REPAIR Right 1990's  . TRANSURETHRAL RESECTION OF PROSTATE      There were no vitals filed for this visit.  Subjective Assessment - 11/28/19 1537    Subjective  Pt reports that he has been working on his exercises and is doing well. Did purchase rollator.    Pertinent History  68 y.o. right-handed male with history of HTN, RA, hyperlipidemia,DM, CKD stage II.  09/16/19 L2-S1 lumbar laminectomy, 9/25 lumbar reexploration with removal of epidural hematoma.    How long can you walk comfortably?  can walk comfortable about 30' but then tires out per patient report    Patient Stated Goals  Pt wants to get to moving more.    Currently in Pain?  Yes    Pain Score  3     Pain Location  Back    Pain Orientation  Lower    Pain Descriptors / Indicators  Aching    Pain Type  Chronic pain;Surgical pain    Pain Onset  More than a month ago    Pain Frequency  Intermittent                       [facility] Adult PT Treatment/Exercise - 11/28/19 1538      Ambulation/Gait   Ambulation/Gait  Yes    Ambulation/Gait Assistance  6: Modified independent (Device/Increase time)    Ambulation Distance (Feet)  700 Feet   68' without AD with slight antalgic gait on right as goes on   Assistive device  4-wheeled walker    Gait Pattern  Step-through pattern    Ambulation Surface  Level;Indoor      Neuro Re-ed    Neuro Re-ed Details   Gait in // bars watching in mirror  for visual cues for upright posture without UE support x 4 laps. Standing in // bars without UE support with feet together eyes open x 30 sec then eyes closed 30 sec x 2, Standing without UE support alternating shoulder flexion with 2# weights with cues to tighten tummy x 10.      Exercises   Exercises  Other Exercises    Other Exercises   Standing with bilateral scapular retraction with cues to brace abdomen throughout to support back x 10 with red theraband. Seated on dynadisc core exercises: alternating shoulder flexion with 2# weights, bilateral scapular retraction x 10 with red theraband, shoulder ER x 10 with red theraband to try to engage obliques some (limited shoulder ER due to old shoulder injurires), marching x 10 bilateral. Cued to brace tummy with all exercises to support back. Sit to stand x 10 from edge of mat with TA contraction.      Lumbar Exercises: Aerobic   Nustep  level 6 x 8 min.              PT Education - 11/28/19 1638    Education Details  Pt to continue with current HEP and making sure to implement TA (transverse abdominus) contraction with daily activities. Also instructed to work on longer walking bouts with rollator at home for more aerobic activity.    Person(s) Educated  Patient    Methods  Explanation    Comprehension  Verbalized understanding       PT Short Term Goals - 11/18/19 1829      PT SHORT TERM GOAL #1   Title  Pt will be independent with gait on varied surfaces with rollator for improved community mobility.    Time  4    Period  Weeks    Status  New    Target Date  12/18/19      PT SHORT TERM GOAL #2   Target Date  11/01/19        PT Long Term Goals - 11/18/19 1831      PT LONG TERM GOAL #1   Title  Pt will be independent in progressive HEP to continue strength, balance and walking program on own.    Time  8    Period  Weeks    Status  On-going    Target Date  01/17/20      PT LONG TERM GOAL #2   Title  Pt will increase gait  speed from 0.64m/s to >0.54m/s for improved gait safety in community.    Baseline  0.31m/s on 11/11/2019    Time  8    Period  Weeks  Status  On-going    Target Date  01/17/20      PT LONG TERM GOAL #3   Title  Pt will increase Berg Balance score from 43/56 to >47/56 for improved balance and decreased fall risk.    Baseline  45/56 on 11/11/2019    Time  8    Period  Weeks    Status  On-going    Target Date  01/17/20      PT LONG TERM GOAL #4   Title  Pt will report <5/10 pain in back with functional activities for improved mobility.    Time  8    Period  Weeks    Status  New    Target Date  01/17/20      PT LONG TERM GOAL #5   Title  Pt will ambulate >300' on varied surfaces with SPC versus no device mod I for improved community mobility.    Time  8    Period  Weeks    Status  On-going    Target Date  01/17/20            Plan - 11/28/19 1639    Clinical Impression Statement  Pt showing improving activity tolerance with longer time on NuStep as well as further gait distance with rollator use. Pt greatly benefits from rollator to help support back with longer gait distances.    Personal Factors and Comorbidities  Comorbidity 3+    Comorbidities  HTN, RA, hyperlipidemia,DM, CKD stage II.    Examination-Activity Limitations  Locomotion Level;Transfers;Squat;Stairs;Stand;Sleep    Examination-Participation Restrictions  Community Activity;Cleaning;Shop;Yard Work    Conservation officer, historic buildings  Evolving/Moderate complexity    Rehab Potential  Good    PT Frequency  1x / week    PT Duration  8 weeks    PT Treatment/Interventions  ADLs/Self Care Home Management;Therapeutic activities;Therapeutic exercise;Balance training;DME Instruction;Cryotherapy;Gait training;Stair training;Neuromuscular re-education;Patient/family education;Manual techniques    PT Next Visit Plan  continue to work on core/LE strengthening, standing balance with vision removed, gait training without AD  short distances and with rollator longer distances.    PT Home Exercise Plan  Access Code: [email or web address]    Consulted and Agree with Plan of Care  Patient       Patient will benefit from skilled therapeutic intervention in order to improve the following deficits and impairments:  Abnormal gait, Decreased balance, Decreased strength, Decreased mobility, Pain, Decreased endurance, Difficulty walking  Visit Diagnosis: Muscle weakness (generalized)  Other abnormalities of gait and mobility     Problem List Patient Active Problem List   Diagnosis Date Noted  . Neurogenic bladder 09/25/2019  . Epidural hematoma (HCC) 09/24/2019  . S/P lumbar laminectomy 09/16/2019  . Lower extremity edema 12/11/2017  . Family history of premature CAD 11/27/2017  . 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 Type 2 diabetes mellitus (HCC) 08/09/2017  . Gout 08/09/2017  . Anxiety with insomnia 08/09/2017  . Hyperlipidemia associated with type 2 diabetes mellitus (HCC) 08/08/2017  . Hypertension associated with diabetes (HCC) 08/08/2017    Ronn Melena, PT, DPT, NCS 11/28/2019, 4:42 PM  [facility] [address] [place], [place], [address] Phone: [phone]   Fax:  [phone]  Name: Jerry York MRN: [id number] Date of Birth: 23-Jun-1951

## 2019-12-04 ENCOUNTER — Ambulatory Visit: Payer: PPO

## 2019-12-04 ENCOUNTER — Other Ambulatory Visit: Payer: Self-pay

## 2019-12-04 DIAGNOSIS — R2689 Other abnormalities of gait and mobility: Secondary | ICD-10-CM

## 2019-12-04 DIAGNOSIS — M6281 Muscle weakness (generalized): Secondary | ICD-10-CM

## 2019-12-04 NOTE — Therapy (Signed)
[facility] [address] [place], [place], [address] Phone: [phone]   Fax:  [phone]  Physical Therapy Treatment  Patient Details  Name: Jerry York MRN: [id number] Date of Birth: 03-13-1951 Referring Provider (PT): Melven Sartorius, [place]   Encounter Date: 12/04/2019  PT End of Session - 12/04/19 1332    Visit Number  13    Number of Visits  19    Date for PT Re-Evaluation  01/17/20    PT Start Time  1330   pt arrived late   PT Stop Time  1357    PT Time Calculation (min)  27 min    Activity Tolerance  Patient tolerated treatment well;No increased pain    Behavior During Therapy  WFL for tasks assessed/performed       Past Medical History:  Diagnosis Date  . Anxiety   . Bilateral renal cysts   . Gout    02-22-2017 acute right foot gout---  per pt resolved  . Gout   . Gross hematuria   . Heart murmur   . History of BPH 08/14/2017  . Hydronephrosis, left   . Hyperlipidemia   . Hypertension   . Renal insufficiency   . Rheumatoid arthritis ([place])   . Swelling   . Type 2 diabetes mellitus ([place])   . Urinary retention   . Wears glasses     Past Surgical History:  Procedure Laterality Date  . APPENDECTOMY  age 17  . CYSTOSCOPY WITH URETEROSCOPY AND STENT PLACEMENT Left 05/08/2017   Procedure: URETEROSCOPY AND STENT PLACEMENT;  Surgeon: Kathie Rhodes, MD;  Location: [facility];  Service: Urology;  Laterality: Left;  . CYSTOSCOPY/RETROGRADE/URETEROSCOPY Bilateral 05/08/2017   Procedure: CYSTOSCOPY/ BILATERAL RETROGRADE;  Surgeon: Kathie Rhodes, MD;  Location[facility];  Service: Urology;  Laterality: Bilateral;  . HEMATOMA EVACUATION N/A 09/20/2019   Procedure: EVACUATION LUMBAR EPIDURAL HEMATOMA;  Surgeon: Newman Pies, MD;  Location: [facility];  Service: Neurosurgery;  Laterality: N/A;  EVACUATION LUMBAR EPIDURAL HEMATOMA  . INGUINAL HERNIA REPAIR Right 10/10/2000  .  KNEE ARTHROSCOPY Right 1980's  . LUMBAR LAMINECTOMY/DECOMPRESSION MICRODISCECTOMY N/A 09/16/2019   Procedure: Laminectomy and Foraminotomy - Lumbar Two-Lumbar Three - Lumbar Three-Lumbar Four - Lumbar Four-Lumbar Five - Lumbar Five-Sacral One;  Surgeon: Eustace Moore, MD;  Location: [facility] OR;  Service: Neurosurgery;  Laterality: N/A;  Laminectomy and Foraminotomy - Lumbar Two-Lumbar Three - Lumbar Three-Lumbar Four - Lumbar Four-Lumbar Five - Lumbar Five-Sacral One  . NASAL FRACTURE SURGERY     in highschool   . SHOULDER ARTHROSCOPY WITH OPEN ROTATOR CUFF REPAIR Right 1990's  . TRANSURETHRAL RESECTION OF PROSTATE      There were no vitals filed for this visit.  Subjective Assessment - 12/04/19 1333    Subjective  Pt reports that he is doing well but still finds it difficult to stand upright.    Pertinent History  68 y.o. right-handed male with history of HTN, RA, hyperlipidemia,DM, CKD stage II.  09/16/19 L2-S1 lumbar laminectomy, 9/25 lumbar reexploration with removal of epidural hematoma.    How long can you walk comfortably?  can walk comfortable about 30' but then tires out per patient report    Patient Stated Goals  Pt wants to get to moving more.    Currently in Pain?  Yes    Pain Score  5     Pain Location  Back    Pain Orientation  Lower    Pain Descriptors /  Indicators  Aching    Pain Type  Chronic pain    Pain Onset  More than a month ago                       [facility] Adult PT Treatment/Exercise - 12/04/19 1340      Ambulation/Gait   Ambulation/Gait  Yes    Ambulation/Gait Assistance  5: Supervision    Ambulation/Gait Assistance Details  Verbal cues to keep tummy tight and try to relax arms    Ambulation Distance (Feet)  115 Feet    Assistive device  None    Gait Pattern  Step-through pattern;Antalgic    Ambulation Surface  Level;Indoor    Gait Comments  noted antalgia on right with increasing trunk flexion as went on      Neuro Re-ed    Neuro Re-ed Details    Standing on rockerboard positioned ant/post maintaining level x 30 sec then rocking ant/post x 10, then alternating shoulder flexion x 10 with CGA and cues to tighten tummy.      Exercises   Exercises  Other Exercises    Other Exercises   Standing at wall trying to straighten back against wall with shoulders back, performed scapular retraction x 10, alternating shoulder flexion x 10 with cues to brace tummy. Wall slides x 10 with verbal cues to keep back against wall.  Seated bilateral scapular retraction 10 x 2 with red theraband. Hooklying bridges x 10 with cues to tighten tummy, hooking lying bent knee fallouts x 10 with red theraband. Sit to stand x 10 with verbal cues to tighten tummy and get balance and control descent.             PT Education - 12/04/19 1517    Education Details  Pt to continue with current HEP.    Person(s) Educated  Patient    Methods  Explanation    Comprehension  Verbalized understanding       PT Short Term Goals - 11/18/19 1829      PT SHORT TERM GOAL #1   Title  Pt will be independent with gait on varied surfaces with rollator for improved community mobility.    Time  4    Period  Weeks    Status  New    Target Date  12/18/19      PT SHORT TERM GOAL #2   Target Date  11/01/19        PT Long Term Goals - 11/18/19 1831      PT LONG TERM GOAL #1   Title  Pt will be independent in progressive HEP to continue strength, balance and walking program on own.    Time  8    Period  Weeks    Status  On-going    Target Date  01/17/20      PT LONG TERM GOAL #2   Title  Pt will increase gait speed from 0.46m/s to >0.80m/s for improved gait safety in community.    Baseline  0.68m/s on 11/11/2019    Time  8    Period  Weeks    Status  On-going    Target Date  01/17/20      PT LONG TERM GOAL #3   Title  Pt will increase Berg Balance score from 43/56 to >47/56 for improved balance and decreased fall risk.    Baseline  45/56 on 11/11/2019    Time  8     Period  Weeks  Status  On-going    Target Date  01/17/20      PT LONG TERM GOAL #4   Title  Pt will report <5/10 pain in back with functional activities for improved mobility.    Time  8    Period  Weeks    Status  New    Target Date  01/17/20      PT LONG TERM GOAL #5   Title  Pt will ambulate >300' on varied surfaces with SPC versus no device mod I for improved community mobility.    Time  8    Period  Weeks    Status  On-going    Target Date  01/17/20            Plan - 12/04/19 1520    Clinical Impression Statement  PT focused on postural exercises and core strength today trying to get patient to maintain more erect posture in standing/gait. Pt was able to get straighter with cuing but does flex more as fatigues.    Personal Factors and Comorbidities  Comorbidity 3+    Comorbidities  HTN, RA, hyperlipidemia,DM, CKD stage II.    Examination-Activity Limitations  Locomotion Level;Transfers;Squat;Stairs;Stand;Sleep    Examination-Participation Restrictions  Community Activity;Cleaning;Shop;Yard Work    Conservation officer, historic buildings  Evolving/Moderate complexity    Rehab Potential  Good    PT Frequency  1x / week    PT Duration  8 weeks    PT Treatment/Interventions  ADLs/Self Care Home Management;Therapeutic activities;Therapeutic exercise;Balance training;DME Instruction;Cryotherapy;Gait training;Stair training;Neuromuscular re-education;Patient/family education;Manual techniques    PT Next Visit Plan  continue to work on core/LE strengthening, standing balance with vision removed, gait training without AD short distances and with rollator longer distances.    PT Home Exercise Plan  Access Code: [email or web address]    Consulted and Agree with Plan of Care  Patient       Patient will benefit from skilled therapeutic intervention in order to improve the following deficits and impairments:  Abnormal gait, Decreased balance, Decreased strength, Decreased mobility, Pain,  Decreased endurance, Difficulty walking  Visit Diagnosis: Other abnormalities of gait and mobility  Muscle weakness (generalized)     Problem List Patient Active Problem List   Diagnosis Date Noted  . Neurogenic bladder 09/25/2019  . Epidural hematoma (H[facility]) 09/24/2019  . S/P lumbar laminectomy 09/16/2019  . Lower extremity edema 12/11/2017  . Family history of premature CAD 11/27/2017  . 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 Type 2 diabetes mellitus (H[facility]) 08/09/2017  . Gout 08/09/2017  . Anxiety with insomnia 08/09/2017  . Hyperlipidemia associated with type 2 diabetes mellitus (HCC) 08/08/2017  . Hypertension associated with diabetes (HCC) 08/08/2017    Ronn Melena, PT, DPT, NCS 12/04/2019, 3:22 PM  [facility] [address] [place], [place], [address] Phone: [phone]   Fax:  [phone]  Name: ZONG MCQUARRIE MRN: [id number] Date of Birth: Nov 21, 1951

## 2019-12-09 ENCOUNTER — Ambulatory Visit: Payer: PPO

## 2019-12-09 ENCOUNTER — Other Ambulatory Visit: Payer: Self-pay

## 2019-12-09 DIAGNOSIS — M6281 Muscle weakness (generalized): Secondary | ICD-10-CM

## 2019-12-09 DIAGNOSIS — R2689 Other abnormalities of gait and mobility: Secondary | ICD-10-CM

## 2019-12-09 NOTE — Therapy (Signed)
[facility] [address] [place], [place], [address] Phone: [phone]   Fax:  [phone]  Physical Therapy Treatment  Patient Details  Name: Jerry York MRN: [id number] Date of Birth: May 30, 1951 Referring Provider (PT): Eino Farber, [name]   Encounter Date: 12/09/2019  PT End of Session - 12/09/19 1106    Visit Number  14    Number of Visits  19    Date for PT Re-Evaluation  01/17/20    PT Start Time  1102    PT Stop Time  1142   NuStep time not counted   PT Time Calculation (min)  40 min    Activity Tolerance  Patient tolerated treatment well    Behavior During Therapy  [facility] for tasks assessed/performed       Past Medical History:  Diagnosis Date  . Anxiety   . Bilateral renal cysts   . Gout    02-22-2017 acute right foot gout---  per pt resolved  . Gout   . Gross hematuria   . Heart murmur   . History of BPH 08/14/2017  . Hydronephrosis, left   . Hyperlipidemia   . Hypertension   . Renal insufficiency   . Rheumatoid arthritis (H[facility])   . Swelling   . Type 2 diabetes mellitus (H[facility])   . Urinary retention   . Wears glasses     Past Surgical History:  Procedure Laterality Date  . APPENDECTOMY  age 16  . CYSTOSCOPY WITH URETEROSCOPY AND STENT PLACEMENT Left 05/08/2017   Procedure: URETEROSCOPY AND STENT PLACEMENT;  Surgeon: Ihor Gully, MD;  Location: [facility];  Service: Urology;  Laterality: Left;  . CYSTOSCOPY/RETROGRADE/URETEROSCOPY Bilateral 05/08/2017   Procedure: CYSTOSCOPY/ BILATERAL RETROGRADE;  Surgeon: Ihor Gully, MD;  Location: [facility];  Service: Urology;  Laterality: Bilateral;  . HEMATOMA EVACUATION N/A 09/20/2019   Procedure: EVACUATION LUMBAR EPIDURAL HEMATOMA;  Surgeon: Tressie Stalker, MD;  Location: [facility] OR;  Service: Neurosurgery;  Laterality: N/A;  EVACUATION LUMBAR EPIDURAL HEMATOMA  . INGUINAL HERNIA REPAIR Right 10/10/2000  . KNEE  ARTHROSCOPY Right 1980's  . LUMBAR LAMINECTOMY/DECOMPRESSION MICRODISCECTOMY N/A 09/16/2019   Procedure: Laminectomy and Foraminotomy - Lumbar Two-Lumbar Three - Lumbar Three-Lumbar Four - Lumbar Four-Lumbar Five - Lumbar Five-Sacral One;  Surgeon: Tia Alert, MD;  Location: [facility] OR;  Service: Neurosurgery;  Laterality: N/A;  Laminectomy and Foraminotomy - Lumbar Two-Lumbar Three - Lumbar Three-Lumbar Four - Lumbar Four-Lumbar Five - Lumbar Five-Sacral One  . NASAL FRACTURE SURGERY     in highschool   . SHOULDER ARTHROSCOPY WITH OPEN ROTATOR CUFF REPAIR Right 1990's  . TRANSURETHRAL RESECTION OF PROSTATE      There were no vitals filed for this visit.  Subjective Assessment - 12/09/19 1106    Subjective  Pt reports he is feeling a little stiff today.    Pertinent History  68 y.o. right-handed male with history of HTN, RA, hyperlipidemia,DM, CKD stage II.  09/16/19 L2-S1 lumbar laminectomy, 9/25 lumbar reexploration with removal of epidural hematoma.    How long can you walk comfortably?  can walk comfortable about 30' but then tires out per patient report    Patient Stated Goals  Pt wants to get to moving more.    Currently in Pain?  Yes    Pain Score  2     Pain Location  Back    Pain Orientation  Lower    Pain Descriptors / Indicators  Aching    Pain  Type  Chronic pain    Pain Onset  More than a month ago    Pain Frequency  Intermittent                       [facility] Adult PT Treatment/Exercise - 12/09/19 1107      Ambulation/Gait   Ambulation/Gait  Yes    Ambulation/Gait Assistance  6: Modified independent (Device/Increase time)    Ambulation Distance (Feet)  1000 Feet    Assistive device  4-wheeled walker    Gait Pattern  Step-through pattern    Ambulation Surface  Level;Indoor    Ramp  6: Modified independent (Device)   with 4 wheeled walker   Curb  6: Modified independent (Device/increase time)   with 4 wheeled walker   Gait Comments  Pt ambulated for 8 min  20 sec with rollator. Pt reported 5/10 low back pain after walking. Pt ambulated an additional 250' at end of session without AD with verbal cues for erect posture and to brace abdomen to help supervision. Back pain 7-8/10 without AD with more upright posture today but right antalgic gait noted as went on.      Neuro Re-ed    Neuro Re-ed Details   Standing on rockerboard positioned ant/ post trying to maintain level x 30 sec then with alternating shoulder flexion x 10 with cues for TA contraction.  Mini-squats on rockerboard with UE support x 10 with verbal cues for form. Walking march in // bars with light UE support with verbal cues for TA contraction 6' x 4.      Lumbar Exercises: Aerobic   Nustep  level 6 x 8 min             PT Education - 12/09/19 1251    Education Details  Pt to continue with current HEP    Person(s) Educated  Patient    Methods  Explanation    Comprehension  Verbalized understanding       PT Short Term Goals - 11/18/19 1829      PT SHORT TERM GOAL #1   Title  Pt will be independent with gait on varied surfaces with rollator for improved community mobility.    Time  4    Period  Weeks    Status  New    Target Date  12/18/19      PT SHORT TERM GOAL #2   Target Date  11/01/19        PT Long Term Goals - 11/18/19 1831      PT LONG TERM GOAL #1   Title  Pt will be independent in progressive HEP to continue strength, balance and walking program on own.    Time  8    Period  Weeks    Status  On-going    Target Date  01/17/20      PT LONG TERM GOAL #2   Title  Pt will increase gait speed from 0.50m/s to >0.56m/s for improved gait safety in community.    Baseline  0.51m/s on 11/11/2019    Time  8    Period  Weeks    Status  On-going    Target Date  01/17/20      PT LONG TERM GOAL #3   Title  Pt will increase Berg Balance score from 43/56 to >47/56 for improved balance and decreased fall risk.    Baseline  45/56 on 11/11/2019    Time  8  Period  Weeks    Status  On-going    Target Date  01/17/20      PT LONG TERM GOAL #4   Title  Pt will report <5/10 pain in back with functional activities for improved mobility.    Time  8    Period  Weeks    Status  New    Target Date  01/17/20      PT LONG TERM GOAL #5   Title  Pt will ambulate >300' on varied surfaces with SPC versus no device mod I for improved community mobility.    Time  8    Period  Weeks    Status  On-going    Target Date  01/17/20            Plan - 12/09/19 1253    Clinical Impression Statement  Pt able to maintain more upright posture with activities today. Benefits greatly from rollator for longer distances with less back pain and can go much further.    Personal Factors and Comorbidities  Comorbidity 3+    Comorbidities  HTN, RA, hyperlipidemia,DM, CKD stage II.    Examination-Activity Limitations  Locomotion Level;Transfers;Squat;Stairs;Stand;Sleep    Examination-Participation Restrictions  Community Activity;Cleaning;Shop;Yard Work    Merchant [employer] officer  Evolving/Moderate complexity    Rehab Potential  Good    PT Frequency  1x / week    PT Duration  8 weeks    PT Treatment/Interventions  ADLs/Self Care Home Management;Therapeutic activities;Therapeutic exercise;Balance training;DME Instruction;Cryotherapy;Gait training;Stair training;Neuromuscular re-education;Patient/family education;Manual techniques    PT Next Visit Plan  continue to work on core/LE strengthening, standing balance with vision removed, gait training without AD short distances and with rollator longer distances.    PT Home Exercise Plan  Access Code: [email or web address]    Consulted and Agree with Plan of Care  Patient       Patient will benefit from skilled therapeutic intervention in order to improve the following deficits and impairments:  Abnormal gait, Decreased balance, Decreased strength, Decreased mobility, Pain, Decreased endurance, Difficulty walking  Visit  Diagnosis: Other abnormalities of gait and mobility  Muscle weakness (generalized)     Problem List Patient Active Problem List   Diagnosis Date Noted  . Neurogenic bladder 09/25/2019  . Epidural hematoma ([place]) 09/24/2019  . S/P lumbar laminectomy 09/16/2019  . Lower extremity edema 12/11/2017  . Family history of premature CAD 11/27/2017  . 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 Type 2 diabetes mellitus ([place]) 08/09/2017  . Gout 08/09/2017  . Anxiety with insomnia 08/09/2017  . Hyperlipidemia associated with type 2 diabetes mellitus ([place]) 08/08/2017  . Hypertension associated with diabetes ([place]) 08/08/2017    Electa Sniff, PT, DPT, NCS 12/09/2019, 12:55 PM  [facility] [address] [place], [place], [address] Phone: [phone]   Fax:  [phone]  Name: KENNON ENCINAS MRN: [id number] Date of Birth: 02-16-1951

## 2019-12-12 ENCOUNTER — Other Ambulatory Visit: Payer: Self-pay | Admitting: Family Medicine

## 2019-12-12 ENCOUNTER — Telehealth: Payer: Self-pay | Admitting: Family Medicine

## 2019-12-12 NOTE — Telephone Encounter (Signed)
I left a message asking the patient to call and schedule Medicare AWV with Courtney ([facility]).  If patient calls back, please schedule Medicare Wellness Visit (initial) at next available opening.  VDM (Dee-Dee) 

## 2019-12-12 NOTE — Telephone Encounter (Signed)
Rx request 

## 2019-12-16 ENCOUNTER — Ambulatory Visit: Payer: PPO

## 2019-12-16 ENCOUNTER — Other Ambulatory Visit: Payer: Self-pay

## 2019-12-16 DIAGNOSIS — M6281 Muscle weakness (generalized): Secondary | ICD-10-CM

## 2019-12-16 DIAGNOSIS — R2689 Other abnormalities of gait and mobility: Secondary | ICD-10-CM

## 2019-12-16 NOTE — Therapy (Signed)
[place] [address] [place], [place], [address] Phone: [phone]   Fax:  [phone]  Physical Therapy Treatment  Patient Details  Name: Jerry York MRN: [id number] Date of Birth: August 04, 1951 Referring Provider (PT): Melven Sartorius, [place]   Encounter Date: 12/16/2019  PT End of Session - 12/16/19 1314    Visit Number  15    Number of Visits  19    Date for PT Re-Evaluation  01/17/20    PT Start Time  3903    PT Stop Time  1354    PT Time Calculation (min)  41 min    Activity Tolerance  Patient tolerated treatment well    Behavior During Therapy  [facility] for tasks assessed/performed       Past Medical History:  Diagnosis Date  . Anxiety   . Bilateral renal cysts   . Gout    02-22-2017 acute right foot gout---  per pt resolved  . Gout   . Gross hematuria   . Heart murmur   . History of BPH 08/14/2017  . Hydronephrosis, left   . Hyperlipidemia   . Hypertension   . Renal insufficiency   . Rheumatoid arthritis ([place])   . Swelling   . Type 2 diabetes mellitus ([place])   . Urinary retention   . Wears glasses     Past Surgical History:  Procedure Laterality Date  . APPENDECTOMY  age 67  . CYSTOSCOPY WITH URETEROSCOPY AND STENT PLACEMENT Left 05/08/2017   Procedure: URETEROSCOPY AND STENT PLACEMENT;  Surgeon: Kathie Rhodes, MD;  Location: [facility];  Service: Urology;  Laterality: Left;  . CYSTOSCOPY/RETROGRADE/URETEROSCOPY Bilateral 05/08/2017   Procedure: CYSTOSCOPY/ BILATERAL RETROGRADE;  Surgeon: Kathie Rhodes, MD;  Location: [facility];  Service: Urology;  Laterality: Bilateral;  . HEMATOMA EVACUATION N/A 09/20/2019   Procedure: EVACUATION LUMBAR EPIDURAL HEMATOMA;  Surgeon: Newman Pies, MD;  Location: [place];  Service: Neurosurgery;  Laterality: N/A;  EVACUATION LUMBAR EPIDURAL HEMATOMA  . INGUINAL HERNIA REPAIR Right 10/10/2000  . KNEE ARTHROSCOPY Right 1980's  .  LUMBAR LAMINECTOMY/DECOMPRESSION MICRODISCECTOMY N/A 09/16/2019   Procedure: Laminectomy and Foraminotomy - Lumbar Two-Lumbar Three - Lumbar Three-Lumbar Four - Lumbar Four-Lumbar Five - Lumbar Five-Sacral One;  Surgeon: Eustace Moore, MD;  Location: [facility] OR;  Service: Neurosurgery;  Laterality: N/A;  Laminectomy and Foraminotomy - Lumbar Two-Lumbar Three - Lumbar Three-Lumbar Four - Lumbar Four-Lumbar Five - Lumbar Five-Sacral One  . NASAL FRACTURE SURGERY     in highschool   . SHOULDER ARTHROSCOPY WITH OPEN ROTATOR CUFF REPAIR Right 1990's  . TRANSURETHRAL RESECTION OF PROSTATE      There were no vitals filed for this visit.  Subjective Assessment - 12/16/19 1314    Subjective  Pt reports that he is doing well.    Pertinent History  68 y.o. right-handed male with history of HTN, RA, hyperlipidemia,DM, CKD stage II.  09/16/19 L2-S1 lumbar laminectomy, 9/25 lumbar reexploration with removal of epidural hematoma.    How long can you walk comfortably?  can walk comfortable about 30' but then tires out per patient report    Patient Stated Goals  Pt wants to get to moving more.    Currently in Pain?  Yes    Pain Score  4     Pain Location  Back    Pain Orientation  Lower    Pain Descriptors / Indicators  Aching    Pain Type  Chronic pain  Pain Onset  More than a month ago    Pain Frequency  Intermittent                       [facility] Adult PT Treatment/Exercise - 12/16/19 1323      Ambulation/Gait   Ambulation/Gait  Yes    Ambulation/Gait Assistance  6: Modified independent (Device/Increase time)    Ambulation Distance (Feet)  600 Feet    Assistive device  4-wheeled walker    Gait Pattern  Step-through pattern    Ambulation Surface  Level;Unlevel;Indoor;Outdoor;Paved;Grass    Gait Comments  Gait on treadmill x 3 min at 1.87mh with verbal cues for upright posture. Pt's shoulders and arms fatigued causing him to have to stop.      Neuro Re-ed    Neuro Re-ed Details    Standing on airex feet together eyes closed x 30 sec, airex with feet apart with head turns left/right x 10 then with bilateral scapular retraction x 10 with verbal cues to relax shoulders.      Exercises   Exercises  Other Exercises    Other Exercises   Sit to stand x 10 from chair without UE support getting balance each time. Standing with TA contraction: alternating shoulder flexion x 10, marching x 10 BLE, mini-squats with chair right behind x 10 with verbal cues for form to keep chest up.      Lumbar Exercises: Aerobic   Nustep  level 5 BLE only x 5 min.             PT Education - 12/16/19 1538    Education Details  Pt to continue with current HEP    Person(s) Educated  Patient    Methods  Explanation    Comprehension  Verbalized understanding       PT Short Term Goals - 12/16/19 1323      PT SHORT TERM GOAL #1   Title  Pt will be independent with gait on varied surfaces with rollator for improved community mobility.    Baseline  Pt independent with gait with rollator on varied surfaces.    Time  4    Period  Weeks    Status  Achieved    Target Date  12/18/19      PT SHORT TERM GOAL #2   Target Date  11/01/19        PT Long Term Goals - 11/18/19 1831      PT LONG TERM GOAL #1   Title  Pt will be independent in progressive HEP to continue strength, balance and walking program on own.    Time  8    Period  Weeks    Status  On-going    Target Date  01/17/20      PT LONG TERM GOAL #2   Title  Pt will increase gait speed from 0.639m to >0.71m62mfor improved gait safety in community.    Baseline  0.34m80mn 11/11/2019    Time  8    Period  Weeks    Status  On-going    Target Date  01/17/20      PT LONG TERM GOAL #3   Title  Pt will increase Berg Balance score from 43/56 to >47/56 for improved balance and decreased fall risk.    Baseline  45/56 on 11/11/2019    Time  8    Period  Weeks    Status  On-going    Target Date  01/17/20  PT LONG TERM GOAL  #4   Title  Pt will report <5/10 pain in back with functional activities for improved mobility.    Time  8    Period  Weeks    Status  New    Target Date  01/17/20      PT LONG TERM GOAL #5   Title  Pt will ambulate >300' on varied surfaces with SPC versus no device mod I for improved community mobility.    Time  8    Period  Weeks    Status  On-going    Target Date  01/17/20            Plan - 12/16/19 1538    Clinical Impression Statement  Pt met STG with gait with rollator on varied surfaces today. Pt is able to demonstrate improving upright posture with activities but limited with gait without UE support.    Personal Factors and Comorbidities  Comorbidity 3+    Comorbidities  HTN, RA, hyperlipidemia,DM, CKD stage II.    Examination-Activity Limitations  Locomotion Level;Transfers;Squat;Stairs;Stand;Sleep    Examination-Participation Restrictions  Community Activity;Cleaning;Shop;Yard Work    [employer]  Evolving/Moderate complexity    Rehab Potential  Good    PT Frequency  1x / week    PT Duration  8 weeks    PT Treatment/Interventions  ADLs/Self Care Home Management;Therapeutic activities;Therapeutic exercise;Balance training;DME Instruction;Cryotherapy;Gait training;Stair training;Neuromuscular re-education;Patient/family education;Manual techniques    PT Next Visit Plan  continue to work on core/LE strengthening, standing balance with vision removed, gait training without AD short distances and with rollator longer distances.    PT Home Exercise Plan  Access Code: [email or web address]    Consulted and Agree with Plan of Care  Patient       Patient will benefit from skilled therapeutic intervention in order to improve the following deficits and impairments:  Abnormal gait, Decreased balance, Decreased strength, Decreased mobility, Pain, Decreased endurance, Difficulty walking  Visit Diagnosis: Other abnormalities of gait and mobility  Muscle weakness  (generalized)     Problem List Patient Active Problem List   Diagnosis Date Noted  . Neurogenic bladder 09/25/2019  . Epidural hematoma ([place]) 09/24/2019  . S/P lumbar laminectomy 09/16/2019  . Lower extremity edema 12/11/2017  . Family history of premature CAD 11/27/2017  . 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 Type 2 diabetes mellitus ([place]) 08/09/2017  . Gout 08/09/2017  . Anxiety with insomnia 08/09/2017  . Hyperlipidemia associated with type 2 diabetes mellitus ([place]) 08/08/2017  . Hypertension associated with diabetes ([place]) 08/08/2017    Electa Sniff, PT, DPT, NCS 12/16/2019, 3:42 PM  [place] [address] [place], [place], [address] Phone: [phone]   Fax:  [phone]  Name: AYODEJI KEIMIG MRN: [id number] Date of Birth: 1951-04-01

## 2019-12-17 ENCOUNTER — Other Ambulatory Visit: Payer: Self-pay | Admitting: Family Medicine

## 2019-12-18 ENCOUNTER — Other Ambulatory Visit: Payer: Self-pay | Admitting: Family Medicine

## 2019-12-24 ENCOUNTER — Ambulatory Visit: Payer: PPO

## 2019-12-30 ENCOUNTER — Other Ambulatory Visit: Payer: Self-pay

## 2019-12-30 ENCOUNTER — Ambulatory Visit: Payer: PPO | Attending: Physician Assistant

## 2019-12-30 DIAGNOSIS — M6281 Muscle weakness (generalized): Secondary | ICD-10-CM | POA: Insufficient documentation

## 2019-12-30 DIAGNOSIS — R2689 Other abnormalities of gait and mobility: Secondary | ICD-10-CM | POA: Diagnosis not present

## 2019-12-30 NOTE — Therapy (Signed)
[facility] [address] [place], [place], [address] Phone: [phone]   Fax:  [phone]  Physical Therapy Treatment  Patient Details  Name: Jerry York MRN: [id number] Date of Birth: 1951/02/28 Referring Provider (PT): Eino Farber, [name]   Encounter Date: 12/30/2019  PT End of Session - 12/30/19 1311    Visit Number  16    Number of Visits  19    Date for PT Re-Evaluation  01/17/20    PT Start Time  1311    PT Stop Time  1353    PT Time Calculation (min)  42 min    Activity Tolerance  Patient tolerated treatment well    Behavior During Therapy  [facility] for tasks assessed/performed       Past Medical History:  Diagnosis Date  . Anxiety   . Bilateral renal cysts   . Gout    02-22-2017 acute right foot gout---  per pt resolved  . Gout   . Gross hematuria   . Heart murmur   . History of BPH 08/14/2017  . Hydronephrosis, left   . Hyperlipidemia   . Hypertension   . Renal insufficiency   . Rheumatoid arthritis ([facility])   . Swelling   . Type 2 diabetes mellitus ([facility])   . Urinary retention   . Wears glasses     Past Surgical History:  Procedure Laterality Date  . APPENDECTOMY  age 17  . CYSTOSCOPY WITH URETEROSCOPY AND STENT PLACEMENT Left 05/08/2017   Procedure: URETEROSCOPY AND STENT PLACEMENT;  Surgeon: Ihor Gully, MD;  Location: [facility];  Service: Urology;  Laterality: Left;  . CYSTOSCOPY/RETROGRADE/URETEROSCOPY Bilateral 05/08/2017   Procedure: CYSTOSCOPY/ BILATERAL RETROGRADE;  Surgeon: Ihor Gully, MD;  Location[facility];  Service: Urology;  Laterality: Bilateral;  . HEMATOMA EVACUATION N/A 09/20/2019   Procedure: EVACUATION LUMBAR EPIDURAL HEMATOMA;  Surgeon: Tressie Stalker, MD;  Location: [facility] OR;  Service: Neurosurgery;  Laterality: N/A;  EVACUATION LUMBAR EPIDURAL HEMATOMA  . INGUINAL HERNIA REPAIR Right 10/10/2000  . KNEE ARTHROSCOPY Right 1980's  .  LUMBAR LAMINECTOMY/DECOMPRESSION MICRODISCECTOMY N/A 09/16/2019   Procedure: Laminectomy and Foraminotomy - Lumbar Two-Lumbar Three - Lumbar Three-Lumbar Four - Lumbar Four-Lumbar Five - Lumbar Five-Sacral One;  Surgeon: Tia Alert, MD;  Location: [facility] OR;  Service: Neurosurgery;  Laterality: N/A;  Laminectomy and Foraminotomy - Lumbar Two-Lumbar Three - Lumbar Three-Lumbar Four - Lumbar Four-Lumbar Five - Lumbar Five-Sacral One  . NASAL FRACTURE SURGERY     in highschool   . SHOULDER ARTHROSCOPY WITH OPEN ROTATOR CUFF REPAIR Right 1990's  . TRANSURETHRAL RESECTION OF PROSTATE      There were no vitals filed for this visit.  Subjective Assessment - 12/30/19 1312    Subjective  Pt reports that he still can't stand up straight for any amount of time.    Pertinent History  68 y.o. right-handed male with history of HTN, RA, hyperlipidemia,DM, CKD stage II.  09/16/19 L2-S1 lumbar laminectomy, 9/25 lumbar reexploration with removal of epidural hematoma.    How long can you walk comfortably?  can walk comfortable about 30' but then tires out per patient report    Patient Stated Goals  Pt wants to get to moving more.    Currently in Pain?  Yes    Pain Score  3     Pain Location  Back    Pain Orientation  Lower    Pain Descriptors / Indicators  Aching    Pain  Type  Chronic pain    Pain Onset  More than a month ago                       [facility] Adult PT Treatment/Exercise - 12/30/19 1313      Neuro Re-ed    Neuro Re-ed Details   Standing on foam without UE support x 30 sec then with head turns left/right x 10, Standing on pillow with feet together eyes closed x 30 sec, marching on pillow x 10 bilateral. In // bars: tandem gait 6' x 6 with 1 UE support, marching gait with 1 UE support 6' x 4, side stepping on mat without UE support 6' x 6. Pt was given verbal cues to tighten core throughout activities to help support back.      Exercises   Exercises  Other Exercises    Other  Exercises   Standing against wall for upright posture with alternating shoulder flexion x 10 with cues for TA (transverse abdominus) contraction, wall slides x 10, standing away from wall with alternating shoulder flexion x 10. Pt needed to sit due to increased back pain after standing with no support. Standing march x 10 bilateral with light UE support. Sidelying clamshell 10 x 2 bilateral with green theraband with verbal cues for form. Bridges with green theraband behind thighs x 10. Pt denied any increased back pain. Sit to stand x 10 from edge of mat without UE support with green theraband around knees.             PT Education - 12/30/19 1352    Education Details  Pt instructed to add green theraband to bridges, clamshells and sit to stand to work more on hip abductors.       PT Short Term Goals - 12/16/19 1323      PT SHORT TERM GOAL #1   Title  Pt will be independent with gait on varied surfaces with rollator for improved community mobility.    Baseline  Pt independent with gait with rollator on varied surfaces.    Time  4    Period  Weeks    Status  Achieved    Target Date  12/18/19      PT SHORT TERM GOAL #2   Target Date  11/01/19        PT Long Term Goals - 11/18/19 1831      PT LONG TERM GOAL #1   Title  Pt will be independent in progressive HEP to continue strength, balance and walking program on own.    Time  8    Period  Weeks    Status  On-going    Target Date  01/17/20      PT LONG TERM GOAL #2   Title  Pt will increase gait speed from 0.68m/s to >0.41m/s for improved gait safety in community.    Baseline  0.27m/s on 11/11/2019    Time  8    Period  Weeks    Status  On-going    Target Date  01/17/20      PT LONG TERM GOAL #3   Title  Pt will increase Berg Balance score from 43/56 to >47/56 for improved balance and decreased fall risk.    Baseline  45/56 on 11/11/2019    Time  8    Period  Weeks    Status  On-going    Target Date  01/17/20       PT LONG  TERM GOAL #4   Title  Pt will report <5/10 pain in back with functional activities for improved mobility.    Time  8    Period  Weeks    Status  New    Target Date  01/17/20      PT LONG TERM GOAL #5   Title  Pt will ambulate >300' on varied surfaces with SPC versus no device mod I for improved community mobility.    Time  8    Period  Weeks    Status  On-going    Target Date  01/17/20            Plan - 12/30/19 1542    Clinical Impression Statement  Pt needed some cuing for proper form with clamshells today. Pt continues to be limited with longer standing times due to pain in back with increasing flexion as goes on. Pt has more antalgic gait on left with trendelenberg noted. Pt focused more on hip abductor strengthening today.    PT Frequency  1x / week    PT Duration  8 weeks    PT Treatment/Interventions  ADLs/Self Care Home Management;Therapeutic activities;Therapeutic exercise;Balance training;DME Instruction;Cryotherapy;Gait training;Stair training;Neuromuscular re-education;Patient/family education;Manual techniques    PT Next Visit Plan  Start checking LTGs. continue to work on core/LE strengthening, standing balance with vision removed, gait training without AD short distances and with rollator longer distances.       Patient will benefit from skilled therapeutic intervention in order to improve the following deficits and impairments:     Visit Diagnosis: Other abnormalities of gait and mobility  Muscle weakness (generalized)     Problem List Patient Active Problem List   Diagnosis Date Noted  . Neurogenic bladder 09/25/2019  . Epidural hematoma ([facility]) 09/24/2019  . S/P lumbar laminectomy 09/16/2019  . Lower extremity edema 12/11/2017  . Family history of premature CAD 11/27/2017  . 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 Type 2 diabetes mellitus ([facility]) 08/09/2017  . Gout 08/09/2017  . Anxiety with insomnia  08/09/2017  . Hyperlipidemia associated with type 2 diabetes mellitus ([facility]) 08/08/2017  . Hypertension associated with diabetes ([facility]) 08/08/2017    Ronn Melena, PT, DPT, NCS 12/30/2019, 3:44 PM  [facility] [address] [place], [place], [address] Phone: [phone]   Fax:  [phone]  Name: Jerry York MRN: [id number] Date of Birth: 03/21/1951

## 2020-01-06 ENCOUNTER — Other Ambulatory Visit: Payer: Self-pay

## 2020-01-06 ENCOUNTER — Ambulatory Visit: Payer: PPO

## 2020-01-06 DIAGNOSIS — R2689 Other abnormalities of gait and mobility: Secondary | ICD-10-CM

## 2020-01-06 DIAGNOSIS — M6281 Muscle weakness (generalized): Secondary | ICD-10-CM

## 2020-01-06 NOTE — Therapy (Signed)
[facility] [address] [place], [place], [address] Phone: [phone]   Fax:  [phone]  Physical Therapy Treatment  Patient Details  Name: Jerry York MRN: [id number] Date of Birth: [birth year] Referring Provider (PT): Melven Sartorius, [place]   Encounter Date: 01/06/2020  PT End of Session - 01/06/20 1324    Visit Number  17    Number of Visits  19    Date for PT Re-Evaluation  01/17/20    PT Start Time  1320    PT Stop Time  1358   6 min unskilled   PT Time Calculation (min)  38 min    Activity Tolerance  Patient tolerated treatment well    Behavior During Therapy  [facility] for tasks assessed/performed       Past Medical History:  Diagnosis Date  . Anxiety   . Bilateral renal cysts   . Gout    02-22-2017 acute right foot gout---  per pt resolved  . Gout   . Gross hematuria   . Heart murmur   . History of BPH 08/14/2017  . Hydronephrosis, left   . Hyperlipidemia   . Hypertension   . Renal insufficiency   . Rheumatoid arthritis ([place])   . Swelling   . Type 2 diabetes mellitus ([place])   . Urinary retention   . Wears glasses     Past Surgical History:  Procedure Laterality Date  . APPENDECTOMY  age 16  . CYSTOSCOPY WITH URETEROSCOPY AND STENT PLACEMENT Left 05/08/2017   Procedure: URETEROSCOPY AND STENT PLACEMENT;  Surgeon: Kathie Rhodes, MD;  Location: [facility];  Service: Urology;  Laterality: Left;  . CYSTOSCOPY/RETROGRADE/URETEROSCOPY Bilateral 05/08/2017   Procedure: CYSTOSCOPY/ BILATERAL RETROGRADE;  Surgeon: Kathie Rhodes, MD;  Location: [facility];  Service: Urology;  Laterality: Bilateral;  . HEMATOMA EVACUATION N/A 09/20/2019   Procedure: EVACUATION LUMBAR EPIDURAL HEMATOMA;  Surgeon: Newman Pies, MD;  Location: [facility];  Service: Neurosurgery;  Laterality: N/A;  EVACUATION LUMBAR EPIDURAL HEMATOMA  . INGUINAL HERNIA REPAIR Right 10/10/2000  . KNEE ARTHROSCOPY  Right 1980's  . LUMBAR LAMINECTOMY/DECOMPRESSION MICRODISCECTOMY N/A 09/16/2019   Procedure: Laminectomy and Foraminotomy - Lumbar Two-Lumbar Three - Lumbar Three-Lumbar Four - Lumbar Four-Lumbar Five - Lumbar Five-Sacral One;  Surgeon: Eustace Moore, MD;  Location: [facility] OR;  Service: Neurosurgery;  Laterality: N/A;  Laminectomy and Foraminotomy - Lumbar Two-Lumbar Three - Lumbar Three-Lumbar Four - Lumbar Four-Lumbar Five - Lumbar Five-Sacral One  . NASAL FRACTURE SURGERY     in highschool   . SHOULDER ARTHROSCOPY WITH OPEN ROTATOR CUFF REPAIR Right 1990's  . TRANSURETHRAL RESECTION OF PROSTATE      There were no vitals filed for this visit.  Subjective Assessment - 01/06/20 1325    Subjective  Pt goes back to surgeon tomorrow. Has one muscle relaxer left. He has been taking at night to help sleep. Still having trouble standing up straight but walker seems to help some. Only using for longer distances.    Pertinent History  69 y.o. right-handed male with history of HTN, RA, hyperlipidemia,DM, CKD stage II.  09/16/19 L2-S1 lumbar laminectomy, 9/25 lumbar reexploration with removal of epidural hematoma.    How long can you walk comfortably?  can walk comfortable about 30' but then tires out per patient report    Patient Stated Goals  Pt wants to get to moving more.    Currently in Pain?  Yes    Pain Score  5  Pain Location  Back    Pain Orientation  Lower    Pain Descriptors / Indicators  Aching    Pain Type  Chronic pain    Pain Onset  More than a month ago    Pain Frequency  Intermittent                       [facility] Adult PT Treatment/Exercise - 01/06/20 1326      Ambulation/Gait   Ambulation/Gait  Yes    Ambulation/Gait Assistance  6: Modified independent (Device/Increase time)    Ambulation/Gait Assistance Details  Pt was cued to try to keep more erect posture    Ambulation Distance (Feet)  150 Feet    Assistive device  Straight cane    Gait Pattern  Step-through  pattern;Trunk flexed    Ambulation Surface  Level;Indoor    Gait velocity  0.5ms    without AD   Ramp  6: Modified independent (Device)   Performed x 3 with SPC   Curb  6: Modified independent (Device/increase time)   Performed x 2 with SPC   Gait Comments  Pt had to stop due to increased LBP with gait. More flexed trunk as goes on.      Standardized Balance Assessment   Standardized Balance Assessment  Berg Balance Test      Berg Balance Test   Sit to Stand  Able to stand without using hands and stabilize independently    Standing Unsupported  Able to stand safely 2 minutes    Sitting with Back Unsupported but Feet Supported on Floor or Stool  Able to sit safely and securely 2 minutes    Stand to Sit  Sits safely with minimal use of hands    Transfers  Able to transfer safely, minor use of hands    Standing Unsupported with Eyes Closed  Able to stand 10 seconds safely    Standing Ubsupported with Feet Together  Able to place feet together independently and stand 1 minute safely    From Standing, Reach Forward with Outstretched Arm  Can reach forward >5 cm safely (2")   limited due to back surgery   From Standing Position, Pick up Object from Floor  Able to pick up shoe safely and easily    From Standing Position, Turn to Look Behind Over each Shoulder  Looks behind one side only/other side shows less weight shift    Turn 360 Degrees  Able to turn 360 degrees safely but slowly    Standing Unsupported, Alternately Place Feet on Step/Stool  Able to stand independently and safely and complete 8 steps in 20 seconds    Standing Unsupported, One Foot in Front  Able to plae foot ahead of the other independently and hold 30 seconds    Standing on One Leg  Tries to lift leg/unable to hold 3 seconds but remains standing independently    Total Score  47      Exercises   Exercises  Other Exercises    Other Exercises   In // bars: Alternate shoulder flexion with step with TA contraction 6' x 4,  marching gait 6' x 4 with verbal cues to control steps and keep head up.      Lumbar Exercises: Aerobic   Nustep  Level 6 x 6 min with UE and BLE.              PT Education - 01/06/20 1846    Education Details  Pt to continue with current HEP    Person(s) Educated  Patient    Methods  Explanation    Comprehension  Verbalized understanding       PT Short Term Goals - 12/16/19 1323      PT SHORT TERM GOAL #1   Title  Pt will be independent with gait on varied surfaces with rollator for improved community mobility.    Baseline  Pt independent with gait with rollator on varied surfaces.    Time  4    Period  Weeks    Status  Achieved    Target Date  12/18/19      PT SHORT TERM GOAL #2   Target Date  11/01/19        PT Long Term Goals - 01/06/20 1327      PT LONG TERM GOAL #1   Title  Pt will be independent in progressive HEP to continue strength, balance and walking program on own.    Time  8    Period  Weeks    Status  On-going      PT LONG TERM GOAL #2   Title  Pt will increase gait speed from 0.64ms to >0.855m for improved gait safety in community.    Baseline  0.7440mon 11/11/2019. 0.28m40mithout AD on 01/06/2020    Time  8    Period  Weeks    Status  Achieved      PT LONG TERM GOAL #3   Title  Pt will increase Berg Balance score from 43/56 to >47/56 for improved balance and decreased fall risk.    Baseline  45/56 on 11/11/2019, 47/56 on Berg on 01/06/2020    Time  8    Period  Weeks    Status  Partially Met      PT LONG TERM GOAL #4   Title  Pt will report <5/10 pain in back with functional activities for improved mobility.    Baseline  Pt reports back pain 4-5 currently, up to 7-8/10 when walking longer distances without walker. Relieves when sitting.    Time  8    Period  Weeks    Status  Partially Met      PT LONG TERM GOAL #5   Title  Pt will ambulate >300' on varied surfaces with SPC versus no device mod I for improved community mobility.     Baseline  Pt ambulated 150' with SPC including up/down ramp and up/down curb mod I but then back pain increases.    Time  8    Period  Weeks    Status  Not Met            Plan - 01/06/20 1846    Clinical Impression Statement  Pt was able to increase gait speed today meeting gait speed goal. Pt continues to be limited with longer gait distances with increased LBP. Has been instruted to use rollator for more support with longer distances as can go further. Pt increased Berg score just shy of goal showing decreased fall risk.    PT Frequency  1x / week    PT Duration  8 weeks    PT Treatment/Interventions  ADLs/Self Care Home Management;Therapeutic activities;Therapeutic exercise;Balance training;DME Instruction;Cryotherapy;Gait training;Stair training;Neuromuscular re-education;Patient/family education;Manual techniques    PT Next Visit Plan  How did MD visit go? Finalize HEP for possible d/c pending no changes.       Patient will benefit from skilled therapeutic intervention in order to improve  the following deficits and impairments:     Visit Diagnosis: Other abnormalities of gait and mobility  Muscle weakness (generalized)     Problem List Patient Active Problem List   Diagnosis Date Noted  . Neurogenic bladder 09/25/2019  . Epidural hematoma ([place]) 09/24/2019  . S/P lumbar laminectomy 09/16/2019  . Lower extremity edema 12/11/2017  . Family history of premature CAD 11/27/2017  . 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 Type 2 diabetes mellitus ([place]) 08/09/2017  . Gout 08/09/2017  . Anxiety with insomnia 08/09/2017  . Hyperlipidemia associated with type 2 diabetes mellitus ([place]) 08/08/2017  . Hypertension associated with diabetes ([place]) 08/08/2017    Electa Sniff, PT, DPT, NCS 01/06/2020, 6:50 PM  [facility] [address] [place], [place], [address] Phone: [phone]    Fax:  [phone]  Name: Jerry York MRN: [id number] Date of Birth: 02/13/1951

## 2020-01-07 DIAGNOSIS — M48062 Spinal stenosis, lumbar region with neurogenic claudication: Secondary | ICD-10-CM | POA: Diagnosis not present

## 2020-01-13 ENCOUNTER — Other Ambulatory Visit: Payer: Self-pay

## 2020-01-13 ENCOUNTER — Ambulatory Visit: Payer: PPO

## 2020-01-13 DIAGNOSIS — R2689 Other abnormalities of gait and mobility: Secondary | ICD-10-CM

## 2020-01-13 DIAGNOSIS — M6281 Muscle weakness (generalized): Secondary | ICD-10-CM

## 2020-01-13 NOTE — Patient Instructions (Signed)
Access Code: [email or web address]  URL: [email or web address]  Date: 01/13/2020  Prepared by: Elmer Bales   Exercises Supine Posterior Pelvic Tilt - 10 reps - 1 sets - 5 hold - 1x daily - 5x weekly Supine Bridge - 10 reps - 1 sets - 1x daily - 5x weekly Supine March with Posterior Pelvic Tilt - 10 reps - 1 sets - 1x daily                            - 5x weekly Hooklying Clamshell with Resistance - 10 reps - 1 sets - 5 hold - 1x daily - 5x weekly Sit to Stand without Arm Support - 5 reps - 2 sets - 1x daily - 5x weekly Standing Near Stance in Corner with Eyes Closed - 2-3 reps - 1 sets - 30 hold - 1x daily - 5x weekly Standing Balance in Corner with Eyes Closed - 10 reps - 1 sets - 1x daily - 5x weekly Scapular Retraction with Resistance - 10 reps - 3 sets - 1x daily - 5x weekly

## 2020-01-13 NOTE — Therapy (Signed)
[facility] [address] [place], [place], [address] Phone: [phone]   Fax:  [phone]  Physical Therapy Treatment/ Discharge  Patient Details  Name: Jerry York MRN: [id number] Date of Birth: 1951-05-01 Referring Provider (PT): Melven Sartorius, PA  PHYSICAL THERAPY DISCHARGE SUMMARY  Visits from Start of Care: 18  Current functional level related to goals / functional outcomes: See clinical summary for more info. Pt is ambulating without AD short distances and with rollator longer distances due to back pain and core weakness.   Remaining deficits: Low back pain and weakness in core  Education / Equipment: HEP  Plan: Patient agrees to discharge.  Patient goals were not met. Patient is being discharged due to                                                     ??? plateau in progress towards decreasing pain and gait without AD??        Encounter Date: 01/13/2020  PT End of Session - 01/13/20 1323    Visit Number  18    Number of Visits  19    Date for PT Re-Evaluation  01/17/20    PT Start Time  6629    PT Stop Time  1350   d/c visit. Did not need whole time   PT Time Calculation (min)  29 min    Activity Tolerance  Patient tolerated treatment well    Behavior During Therapy  WFL for tasks assessed/performed       Past Medical History:  Diagnosis Date  . Anxiety   . Bilateral renal cysts   . Gout    02-22-2017 acute right foot gout---  per pt resolved  . Gout   . Gross hematuria   . Heart murmur   . History of BPH 08/14/2017  . Hydronephrosis, left   . Hyperlipidemia   . Hypertension   . Renal insufficiency   . Rheumatoid arthritis ([place])   . Swelling   . Type 2 diabetes mellitus ([place])   . Urinary retention   . Wears glasses     Past Surgical History:  Procedure Laterality Date  . APPENDECTOMY  age 81  . CYSTOSCOPY WITH URETEROSCOPY AND STENT PLACEMENT Left 05/08/2017   Procedure:  URETEROSCOPY AND STENT PLACEMENT;  Surgeon: Kathie Rhodes, MD;  Location: [facility];  Service: Urology;  Laterality: Left;  . CYSTOSCOPY/RETROGRADE/URETEROSCOPY Bilateral 05/08/2017   Procedure: CYSTOSCOPY/ BILATERAL RETROGRADE;  Surgeon: Kathie Rhodes, MD;  Location: [facility];  Service: Urology;  Laterality: Bilateral;  . HEMATOMA EVACUATION N/A 09/20/2019   Procedure: EVACUATION LUMBAR EPIDURAL HEMATOMA;  Surgeon: Newman Pies, MD;  Location: [facility];  Service: Neurosurgery;  Laterality: N/A;  EVACUATION LUMBAR EPIDURAL HEMATOMA  . INGUINAL HERNIA REPAIR Right 10/10/2000  . KNEE ARTHROSCOPY Right 1980's  . LUMBAR LAMINECTOMY/DECOMPRESSION MICRODISCECTOMY N/A 09/16/2019   Procedure: Laminectomy and Foraminotomy - Lumbar Two-Lumbar Three - Lumbar Three-Lumbar Four - Lumbar Four-Lumbar Five - Lumbar Five-Sacral One;  Surgeon: Eustace Moore, MD;  [facility]: [facility];  Service: Neurosurgery;  Laterality: N/A;  Laminectomy and Foraminotomy - Lumbar Two-Lumbar Three - Lumbar Three-Lumbar Four - Lumbar Four-Lumbar Five - Lumbar Five-Sacral One  . NASAL FRACTURE SURGERY     in highschool   . SHOULDER ARTHROSCOPY WITH OPEN ROTATOR CUFF  REPAIR Right 1990's  . TRANSURETHRAL RESECTION OF PROSTATE      There were no vitals filed for this visit.  Subjective Assessment - 01/13/20 1323    Subjective  Pt reports that he saw the surgeon last week. He did not change anything but did suggest doing another MRI.    Pertinent History  69 y.o. right-handed male with history of HTN, RA, hyperlipidemia,DM, CKD stage II.  09/16/19 L2-S1 lumbar laminectomy, 9/25 lumbar reexploration with removal of epidural hematoma.    How long can you walk comfortably?  can walk comfortable about 30' but then tires out per patient report    Patient Stated Goals  Pt wants to get to moving more.    Currently in Pain?  Yes    Pain Score  4     Pain Location  Back    Pain Orientation  Lower    Pain  Descriptors / Indicators  Aching    Pain Type  Chronic pain    Pain Onset  More than a month ago                       [facility] Adult PT Treatment/Exercise - 01/13/20 1328      Berg Balance Test   Sit to Stand  Able to stand without using hands and stabilize independently    Standing Unsupported  Able to stand safely 2 minutes    Sitting with Back Unsupported but Feet Supported on Floor or Stool  Able to sit safely and securely 2 minutes    Stand to Sit  Sits safely with minimal use of hands    Transfers  Able to transfer safely, minor use of hands    Standing Unsupported with Eyes Closed  Able to stand 10 seconds safely    Standing Ubsupported with Feet Together  Able to place feet together independently and stand 1 minute safely    From Standing, Reach Forward with Outstretched Arm  Can reach forward >12 cm safely (5")    From Standing Position, Pick up Object from Floor  Able to pick up shoe safely and easily    From Standing Position, Turn to Look Behind Over each Shoulder  Looks behind from both sides and weight shifts well    Turn 360 Degrees  Able to turn 360 degrees safely in 4 seconds or less    Standing Unsupported, Alternately Place Feet on Step/Stool  Able to stand independently and safely and complete 8 steps in 20 seconds    Standing Unsupported, One Foot in Front  Able to plae foot ahead of the other independently and hold 30 seconds    Standing on One Leg  Tries to lift leg/unable to hold 3 seconds but remains standing independently    Total Score  51      Exercises   Exercises  Other Exercises    Other Exercises   PT reviewed home program and performed 10 reps: hooklying pelvic tilts x 10 with verbal cues to not lift bottom but just flatten back and continue to breath, posterior pelvic tilt with bridge x 10, pelvic tilt with marching x 10, pelvic tilt with resisted bent knee fall out x 10 each side. Standing at doorways with red theraband in doorjam bilateral  scapular retraction x 10 with verbal cues to maintain abdominal bracing during performance. Standing in corner: feet together eyes closed x 30 sec, staggered stance x 30 sec each position. Pt able to stand erect during  activities but did have some increase in low back pain.             PT Education - 01/13/20 1404    Education Details  PT reviewed HEP. Discussed discharge plan for patient to continue to work on exercises at home. Educated on ways to increase difficulty with core stab exercises by increasing reps or adding resistance with red theraband.    Person(s) Educated  Patient    Methods  Explanation;Demonstration    Comprehension  Verbalized understanding;Returned demonstration       PT Short Term Goals - 12/16/19 1323      PT SHORT TERM GOAL #1   Title  Pt will be independent with gait on varied surfaces with rollator for improved community mobility.    Baseline  Pt independent with gait with rollator on varied surfaces.    Time  4    Period  Weeks    Status  Achieved    Target Date  12/18/19      PT SHORT TERM GOAL #2   Target Date  11/01/19        PT Long Term Goals - 01/13/20 1345      PT LONG TERM GOAL #1   Title  Pt will be independent in progressive HEP to continue strength, balance and walking program on own.    Baseline  Pt is able to perform his HEP independently    Time  8    Period  Weeks    Status  Achieved      PT LONG TERM GOAL #2   Title  Pt will increase gait speed from 0.58ms to >0.857m for improved gait safety in community.    Baseline  0.7465mon 11/11/2019. 0.82m78mithout AD on 01/06/2020    Time  8    Period  Weeks    Status  Achieved      PT LONG TERM GOAL #3   Title  Pt will increase Berg Balance score from 43/56 to >47/56 for improved balance and decreased fall risk.    Baseline  45/56 on 11/11/2019, 47/56 on Berg on 01/06/2020, 01/13/20 Berg=51/56    Time  8    Period  Weeks    Status  Achieved      PT LONG TERM GOAL #4   Title   Pt will report <5/10 pain in back with functional activities for improved mobility.    Baseline  Pt reports back pain 4-5 currently, up to 7-8/10 when walking longer distances without walker. Relieves when sitting.    Time  8    Period  Weeks    Status  Partially Met      PT LONG TERM GOAL #5   Title  Pt will ambulate >300' on varied surfaces with SPC versus no device mod I for improved community mobility.    Baseline  Pt still needs to use walker for longer distances due to increased back pain as goes on. Able to walk without AD short distances in home and short community distances.    Time  8    Period  Weeks    Status  Not Met            Plan - 01/13/20 1405    Clinical Impression Statement  Pt has met all goals except for pain goal and gait distance goal with SPC/no device. Pt is limited with longer gait distances by increasing LBP up to 7-8/10. Pt able to utilize rollator and walk much  further with less pain. PT has instructed him to use rollator for longer distances. Pt has been instructed in core stabilization exercises to continue to work on to help with more upright posture. Pt able to stand erect for short periods but starts to bend forward as goes on due to core weakness. Pt met Berg goal showing improved balance and decreased fall risk. PT discharging today for patient to continue to work on core stab at home. Plateau in further progress with pain and gait without AD at this time.    Stability/Clinical Decision Making  Evolving/Moderate complexity    Rehab Potential  Good    PT Frequency  1x / week    PT Duration  8 weeks    PT Treatment/Interventions  ADLs/Self Care Home Management;Therapeutic activities;Therapeutic exercise;Balance training;DME Instruction;Cryotherapy;Gait training;Stair training;Neuromuscular re-education;Patient/family education;Manual techniques    PT Next Visit Plan  Discharging today       Patient will benefit from skilled therapeutic intervention  in order to improve the following deficits and impairments:  Abnormal gait, Decreased balance, Decreased strength, Decreased mobility, Pain, Decreased endurance, Difficulty walking  Visit Diagnosis: Other abnormalities of gait and mobility  Muscle weakness (generalized)     Problem List Patient Active Problem List   Diagnosis Date Noted  . Neurogenic bladder 09/25/2019  . Epidural hematoma ([place]) 09/24/2019  . S/P lumbar laminectomy 09/16/2019  . Lower extremity edema 12/11/2017  . Family history of premature CAD 11/27/2017  . 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 Type 2 diabetes mellitus ([place]) 08/09/2017  . Gout 08/09/2017  . Anxiety with insomnia 08/09/2017  . Hyperlipidemia associated with type 2 diabetes mellitus ([place]) 08/08/2017  . Hypertension associated with diabetes ([place]) 08/08/2017    Electa Sniff, PT, DPT, NCS 01/13/2020, 2:10 PM  [facility] [address] [place], [place], [address] Phone: [phone]   Fax:  [phone]  Name: LATRAVIS GRINE MRN: [id number] Date of Birth: 02/09/1951

## 2020-01-21 ENCOUNTER — Encounter: Payer: Self-pay | Admitting: Podiatry

## 2020-01-21 ENCOUNTER — Ambulatory Visit: Payer: PPO | Admitting: Podiatry

## 2020-01-21 ENCOUNTER — Other Ambulatory Visit: Payer: Self-pay

## 2020-01-21 DIAGNOSIS — B351 Tinea unguium: Secondary | ICD-10-CM | POA: Insufficient documentation

## 2020-01-21 DIAGNOSIS — M79674 Pain in right toe(s): Secondary | ICD-10-CM

## 2020-01-21 DIAGNOSIS — M79675 Pain in left toe(s): Secondary | ICD-10-CM | POA: Diagnosis not present

## 2020-01-21 NOTE — Progress Notes (Signed)
This patient presents to the office with chief complaint of long  nails and diabetic feet.  This patient  says there  is  no pain and discomfort in his  feet.  This patient says there are long thick painful nails.  These nails are painful walking and wearing shoes.  Patient has no history of infection or drainage from both feet.  Patient is unable to  self treat his own nails .Patient says he has developed callus under the outside ball of both feet. This patient presents  to the office today for treatment of the  long nails and a foot evaluation due to history of  Diabetes.  Patient has bee diabetic for 25 years.  He says he was referred to this office by his medical doctor.  General Appearance  Alert, conversant and in no acute stress.  Vascular  Dorsalis pedis and posterior tibial  pulses are palpable  bilaterally.  Capillary return is within normal limits  bilaterally. Temperature is within normal limits  bilaterally.  Neurologic  Senn-Weinstein monofilament wire test within normal limits  bilaterally. Muscle power within normal limits bilaterally.  Nails Thick disfigured discolored nails with subungual debris  from hallux to fifth toes bilaterally. No evidence of bacterial infection or drainage bilaterally.  Orthopedic  No limitations of motion of motion feet .  No crepitus or effusions noted.  No bony pathology or digital deformities noted. MTA left foot.  Mild  HAV  B/L.   Midfoot DJD  B/L.  STJ ROM  Limited.  Plantar flex 5th metatarsal  B/L.   Skin  normotropic skin with no porokeratosis noted bilaterally.  No signs of infections or ulcers noted.     Onychomycosis x 2.    Diabetes with no foot complications  IE  Debride nails x 10.  A diabetic foot exam was performed and there is no evidence of any vascular or neurologic pathology.   Multiple orthopedic deformities. which are all asymptomatic.  RTC 1 year for annual diabetic exam.  .   Helane Gunther DPM

## 2020-01-26 ENCOUNTER — Ambulatory Visit: Payer: PPO

## 2020-01-28 NOTE — Progress Notes (Signed)
Office Visit Note  Patient: Jerry York             Date of Birth: 04/22/1951           MRN: [id number]             PCP: Ardith Dark, MD Referring: Ardith Dark, MD Visit Date: 02/04/2020 Occupation: @GUAROCC @  Subjective:  Left wrist joint pain   History of Present Illness: Jerry York is a 69 y.o. male with history of gout and osteoarthritis.  He is taking allopurinol 300 mg 1 tablet by mouth daily and colchicine 0.6 mg 1 tablet by mouth daily.  He has not had any recent gout flares. He states his last flare was about 2 years ago.  He presents today with left wrist joint pain that started 2 months ago.  He states he had a lumbar discectomy in November and his radiculopathy has resolved.  He attributes his left wrist joint pain to trying to support himself when getting out of bed after surgery.    Activities of Daily Living:  Patient reports morning stiffness for 5  minutes.   Patient Denies nocturnal pain.  Difficulty dressing/grooming: Denies Difficulty climbing stairs: Denies Difficulty getting out of chair: Denies Difficulty using hands for taps, buttons, cutlery, and/or writing: Denies  Review of Systems  Constitutional: Negative for fatigue and night sweats.  HENT: Negative for mouth sores, mouth dryness and nose dryness.   Eyes: Negative for redness and dryness.  Respiratory: Negative for shortness of breath and difficulty breathing.   Cardiovascular: Negative for chest pain, palpitations, hypertension, irregular heartbeat and swelling in legs/feet.  Gastrointestinal: Negative for constipation and diarrhea.  Endocrine: Negative for increased urination.  Genitourinary: Negative for painful urination.  Musculoskeletal: Positive for arthralgias, joint pain and morning stiffness. Negative for joint swelling, myalgias, muscle weakness, muscle tenderness and myalgias.  Skin: Negative for color change, rash, hair loss, nodules/bumps, skin tightness, ulcers and  sensitivity to sunlight.  Allergic/Immunologic: Negative for susceptible to infections.  Neurological: Negative for dizziness, fainting, memory loss, night sweats and weakness.  Hematological: Negative for swollen glands.  Psychiatric/Behavioral: Negative for depressed mood and sleep disturbance. The patient is not nervous/anxious.     PMFS History:  Patient Active Problem List   Diagnosis Date Noted  . Pain due to onychomycosis of toenails of both feet 01/21/2020  . Neurogenic bladder 09/25/2019  . Epidural hematoma (HCC) 09/24/2019  . S/P lumbar laminectomy 09/16/2019  . Lower extremity edema 12/11/2017  . Family history of premature CAD 11/27/2017  . 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 Type 2 diabetes mellitus (HCC) 08/09/2017  . Gout 08/09/2017  . Anxiety with insomnia 08/09/2017  . Hyperlipidemia associated with type 2 diabetes mellitus (HCC) 08/08/2017  . Hypertension associated with diabetes (HCC) 08/08/2017    Past Medical History:  Diagnosis Date  . Anxiety   . Bilateral renal cysts   . Gout    02-22-2017 acute right foot gout---  per pt resolved  . Gout   . Gross hematuria   . Heart murmur   . History of BPH 08/14/2017  . Hydronephrosis, left   . Hyperlipidemia   . Hypertension   . Renal insufficiency   . Rheumatoid arthritis (HCC)   . Swelling   . Type 2 diabetes mellitus (HCC)   . Urinary retention   . Wears glasses     Family History  Problem Relation Age of Onset  . Congestive Heart  Failure Mother        Related to valve disease.  Opted to treat medically  . Hypertension Mother   . Hyperlipidemia Mother   . Diabetes Mother   . Hypertension Father   . Hyperlipidemia Father   . Coronary artery disease Father 75       History of CABG  . Stroke Father   . Hypertension Brother   . Diabetes Brother   . Hypertension Daughter   . Hypertension Son   . Hypertension Brother   . Hypertension Brother    Past Surgical  History:  Procedure Laterality Date  . APPENDECTOMY  age 75  . CYSTOSCOPY WITH URETEROSCOPY AND STENT PLACEMENT Left 05/08/2017   Procedure: URETEROSCOPY AND STENT PLACEMENT;  Surgeon: Ihor Gully, MD;  Location: [facility];  Service: Urology;  Laterality: Left;  . CYSTOSCOPY/RETROGRADE/URETEROSCOPY Bilateral 05/08/2017   Procedure: CYSTOSCOPY/ BILATERAL RETROGRADE;  Surgeon: Ihor Gully, MD;  Location: [facility];  Service: Urology;  Laterality: Bilateral;  . HEMATOMA EVACUATION N/A 09/20/2019   Procedure: EVACUATION LUMBAR EPIDURAL HEMATOMA;  Surgeon: Tressie Stalker, MD;  Location: [facility] OR;  Service: Neurosurgery;  Laterality: N/A;  EVACUATION LUMBAR EPIDURAL HEMATOMA  . INGUINAL HERNIA REPAIR Right 10/10/2000  . KNEE ARTHROSCOPY Right 1980's  . LUMBAR LAMINECTOMY/DECOMPRESSION MICRODISCECTOMY N/A 09/16/2019   Procedure: Laminectomy and Foraminotomy - Lumbar Two-Lumbar Three - Lumbar Three-Lumbar Four - Lumbar Four-Lumbar Five - Lumbar Five-Sacral One;  Surgeon: Tia Alert, MD;  Location[facility];  Service: Neurosurgery;  Laterality: N/A;  Laminectomy and Foraminotomy - Lumbar Two-Lumbar Three - Lumbar Three-Lumbar Four - Lumbar Four-Lumbar Five - Lumbar Five-Sacral One  . NASAL FRACTURE SURGERY     in highschool   . SHOULDER ARTHROSCOPY WITH OPEN ROTATOR CUFF REPAIR Right 1990's  . TRANSURETHRAL RESECTION OF PROSTATE     Social History   Social History Narrative  . Not on file   Immunization History  Administered Date(s) Administered  . Fluad Quad(high Dose 65+) 10/07/2019  . Influenza, High Dose Seasonal PF 10/12/2018  . Influenza,inj,Quad PF,6+ Mos 09/26/2017  . Influenza-Unspecified 09/26/2017  . Pneumococcal Conjugate-13 11/10/2017  . Tdap 11/02/2018     Objective: Vital Signs: BP 138/76 (BP Location: Left Arm, Patient Position: Sitting, Cuff Size: Large)   Pulse 66   Resp 12   Ht 5\' 6"  (1.676 m)   Wt 185 lb 3.2 oz (84 kg)   BMI 29.89  kg/m    Physical Exam Vitals and nursing note reviewed.  Constitutional:      Appearance: He is well-developed.  HENT:     Head: Normocephalic and atraumatic.  Eyes:     Conjunctiva/sclera: Conjunctivae normal.     Pupils: Pupils are equal, round, and reactive to light.  Pulmonary:     Effort: Pulmonary effort is normal.     Breath sounds: Normal breath sounds.  Abdominal:     General: Bowel sounds are normal.     Palpations: Abdomen is soft.  Musculoskeletal:     Cervical back: Normal range of motion and neck supple.  Skin:    General: Skin is warm and dry.     Capillary Refill: Capillary refill takes less than 2 seconds.  Neurological:     Mental Status: He is alert and oriented to person, place, and time.  Psychiatric:        Behavior: Behavior normal.      Musculoskeletal Exam: C-spine, thoracic spine, and lumbar spine good ROM.  No midline spinal tenderness.  No SI joint tenderness.  Shoulder joints, elbow joints, wrist joints, MCPs, PIPs, and DIPs good ROM with no synovitis.  PIP and DIP synovial thickening consistent with osteoarthritis of both hands.  Dupuytren's contracture of left 4th flexor tendon. Complete fist formation bilaterally.  Hip joints, knee joints, ankle joints, MTPs, PIPs, and DIPs good ROM with no synovitis.  No warmth or effusion of knee joints noted.  No tenderness or inflammation of ankle joints.   CDAI Exam: CDAI Score: -- Patient Global: --; Provider Global: -- Swollen: --; Tender: -- Joint Exam 02/04/2020   No joint exam has been documented for this visit   There is currently no information documented on the homunculus. Go to the Rheumatology activity and complete the homunculus joint exam.  Investigation: No additional findings.  Imaging: No results found.  Recent Labs: Lab Results  Component Value Date   WBC 4.3 09/27/2019   HGB 13.1 09/27/2019   PLT 176 09/27/2019   NA 139 09/28/2019   K 3.9 09/28/2019   CL 102 09/28/2019    CO2 27 09/28/2019   GLUCOSE 189 (H) 09/28/2019   BUN 52 (H) 09/28/2019   CREATININE 1.62 (H) 09/28/2019   BILITOT 0.6 09/25/2019   ALKPHOS 65 09/25/2019   AST 33 09/25/2019   ALT 32 09/25/2019   PROT 5.5 (L) 09/25/2019   ALBUMIN 2.6 (L) 09/25/2019   CALCIUM 8.5 (L) 09/28/2019   GFRAA 50 (L) 09/28/2019    Speciality Comments: No specialty comments available.  Procedures:  No procedures performed Allergies: Prednisone, Sulfa antibiotics, Azithromycin, Hydrocodone-acetaminophen, and Metformin and related  We discussed the importance of  Assessment / Plan:     Visit Diagnoses: Idiopathic chronic gout of multiple sites without tophus - allopurinol 300 mg by mouth daily and colchicine 0.6 mg 1 tablet by mouth daily. -Patient denies having any flares in the last 2 or 3 years.  He had no synovitis on examination.  No joint swelling or tophi were noted.  He has been taking allopurinol and colchicine on a regular basis.  Have advised him to stop  colchicine and use it only on as needed basis.  It is possible that he may experience a flare as his uric acid is slightly on the higher side.  Dietary modifications and weight loss were also discussed.  Will obtain labs today.  Plan: Uric acid, CBC with Differential/Platelet, COMPLETE METABOLIC PANEL WITH GFR  Hyperuricemia - Plan: Uric acid  Medication management - Plan: Uric acid, CBC with Differential/Platelet, COMPLETE METABOLIC PANEL WITH GFR  Primary osteoarthritis of right knee-doing well currently is not having much discomfort.  Primary osteoarthritis of right hip-he had good range of motion on the examination today.  DDD lumbar spine-patient states he had recent discectomy and has recovered quite well from that.  Essential hypertension-his blood pressure is fairly well controlled.  Other medical problems are listed as follows:  History of diabetes mellitus  Heart murmur  History of anxiety  History of BPH  Hyperlipidemia  associated with type 2 diabetes mellitus ([place])  Orders: Orders Placed This Encounter  Procedures  . Uric acid  . CBC with Differential/Platelet  . COMPLETE METABOLIC PANEL WITH GFR   No orders of the defined types were placed in this encounter.     Follow-Up Instructions: Return in about 6 months (around 08/03/2020) for Gout.   Bo Merino, MD  Note - This record has been created using Editor, commissioning.  Chart creation errors have been sought, but may not always  have been located. Such creation errors do not reflect on  the standard of medical care.

## 2020-01-31 ENCOUNTER — Ambulatory Visit: Payer: PPO

## 2020-02-02 ENCOUNTER — Ambulatory Visit: Payer: PPO

## 2020-02-04 ENCOUNTER — Other Ambulatory Visit: Payer: Self-pay

## 2020-02-04 ENCOUNTER — Encounter: Payer: Self-pay | Admitting: Rheumatology

## 2020-02-04 ENCOUNTER — Ambulatory Visit: Payer: PPO | Admitting: Rheumatology

## 2020-02-04 VITALS — BP 138/76 | HR 66 | Resp 12 | Ht 66.0 in | Wt 185.2 lb

## 2020-02-04 DIAGNOSIS — M1711 Unilateral primary osteoarthritis, right knee: Secondary | ICD-10-CM | POA: Diagnosis not present

## 2020-02-04 DIAGNOSIS — M5136 Other intervertebral disc degeneration, lumbar region: Secondary | ICD-10-CM

## 2020-02-04 DIAGNOSIS — Z79899 Other long term (current) drug therapy: Secondary | ICD-10-CM

## 2020-02-04 DIAGNOSIS — E790 Hyperuricemia without signs of inflammatory arthritis and tophaceous disease: Secondary | ICD-10-CM

## 2020-02-04 DIAGNOSIS — M1611 Unilateral primary osteoarthritis, right hip: Secondary | ICD-10-CM | POA: Diagnosis not present

## 2020-02-04 DIAGNOSIS — I10 Essential (primary) hypertension: Secondary | ICD-10-CM | POA: Diagnosis not present

## 2020-02-04 DIAGNOSIS — M1A09X0 Idiopathic chronic gout, multiple sites, without tophus (tophi): Secondary | ICD-10-CM | POA: Diagnosis not present

## 2020-02-04 DIAGNOSIS — Z8659 Personal history of other mental and behavioral disorders: Secondary | ICD-10-CM | POA: Diagnosis not present

## 2020-02-04 DIAGNOSIS — Z87438 Personal history of other diseases of male genital organs: Secondary | ICD-10-CM

## 2020-02-04 DIAGNOSIS — R011 Cardiac murmur, unspecified: Secondary | ICD-10-CM

## 2020-02-04 DIAGNOSIS — E1169 Type 2 diabetes mellitus with other specified complication: Secondary | ICD-10-CM | POA: Diagnosis not present

## 2020-02-04 DIAGNOSIS — Z8639 Personal history of other endocrine, nutritional and metabolic disease: Secondary | ICD-10-CM

## 2020-02-04 DIAGNOSIS — M51369 Other intervertebral disc degeneration, lumbar region without mention of lumbar back pain or lower extremity pain: Secondary | ICD-10-CM

## 2020-02-04 DIAGNOSIS — E785 Hyperlipidemia, unspecified: Secondary | ICD-10-CM

## 2020-02-05 LAB — URIC ACID: Uric Acid, Serum: 6.1 mg/dL (ref 4.0–8.0)

## 2020-02-05 LAB — COMPLETE METABOLIC PANEL WITH GFR
AG Ratio: 1.7 (calc) (ref 1.0–2.5)
ALT: 39 U/L (ref 9–46)
AST: 32 U/L (ref 10–35)
Albumin: 3.8 g/dL (ref 3.6–5.1)
Alkaline phosphatase (APISO): 82 U/L (ref 35–144)
BUN/Creatinine Ratio: 21 (calc) (ref 6–22)
BUN: 30 mg/dL — ABNORMAL HIGH (ref 7–25)
CO2: 32 mmol/L (ref 20–32)
Calcium: 8.7 mg/dL (ref 8.6–10.3)
Chloride: 101 mmol/L (ref 98–110)
Creat: 1.46 mg/dL — ABNORMAL HIGH (ref 0.70–1.25)
GFR, Est African American: 57 mL/min/{1.73_m2} — ABNORMAL LOW (ref >=60)
GFR, Est Non African American: 49 mL/min/{1.73_m2} — ABNORMAL LOW (ref >=60)
Globulin: 2.3 g/dL (calc) (ref 1.9–3.7)
Glucose, Bld: 214 mg/dL — ABNORMAL HIGH (ref 65–99)
Potassium: 3.8 mmol/L (ref 3.5–5.3)
Sodium: 142 mmol/L (ref 135–146)
Total Bilirubin: 0.5 mg/dL (ref 0.2–1.2)
Total Protein: 6.1 g/dL (ref 6.1–8.1)

## 2020-02-05 LAB — CBC WITH DIFFERENTIAL/PLATELET
Absolute Monocytes: 280 cells/uL (ref 200–950)
Basophils Absolute: 22 cells/uL (ref 0–200)
Basophils Relative: 0.5 %
Eosinophils Absolute: 69 cells/uL (ref 15–500)
Eosinophils Relative: 1.6 %
HCT: 43.2 % (ref 38.5–50.0)
Hemoglobin: 15.1 g/dL (ref 13.2–17.1)
Lymphs Abs: 1415 cells/uL (ref 850–3900)
MCH: 30.1 pg (ref 27.0–33.0)
MCHC: 35 g/dL (ref 32.0–36.0)
MCV: 86.1 fL (ref 80.0–100.0)
MPV: 10.4 fL (ref 7.5–12.5)
Monocytes Relative: 6.5 %
Neutro Abs: 2516 cells/uL (ref 1500–7800)
Neutrophils Relative %: 58.5 %
Platelets: 133 10*3/uL — ABNORMAL LOW (ref 140–400)
RBC: 5.02 10*6/uL (ref 4.20–5.80)
RDW: 16.3 % — ABNORMAL HIGH (ref 11.0–15.0)
Total Lymphocyte: 32.9 %
WBC: 4.3 10*3/uL (ref 3.8–10.8)

## 2020-02-05 NOTE — Progress Notes (Signed)
Uric acid is 6.1.  Ideally his uric acid should be less than 6.  Please notify patient to continue taking allopurinol as prescribed.  Please advise patient to avoid purine rich diet.   Plts are low but stable.  He has a history of thrombocytopenia.  Please forward labs to PCP.  Creatinine and GFR are improving.

## 2020-02-23 ENCOUNTER — Other Ambulatory Visit: Payer: Self-pay | Admitting: Family Medicine

## 2020-03-06 ENCOUNTER — Other Ambulatory Visit: Payer: Self-pay | Admitting: Family Medicine

## 2020-03-09 DIAGNOSIS — Z5181 Encounter for therapeutic drug level monitoring: Secondary | ICD-10-CM | POA: Diagnosis not present

## 2020-03-09 DIAGNOSIS — Z794 Long term (current) use of insulin: Secondary | ICD-10-CM | POA: Diagnosis not present

## 2020-03-09 DIAGNOSIS — E1165 Type 2 diabetes mellitus with hyperglycemia: Secondary | ICD-10-CM | POA: Diagnosis not present

## 2020-03-09 DIAGNOSIS — E785 Hyperlipidemia, unspecified: Secondary | ICD-10-CM | POA: Diagnosis not present

## 2020-03-10 ENCOUNTER — Encounter: Payer: Self-pay | Admitting: Family Medicine

## 2020-03-14 ENCOUNTER — Other Ambulatory Visit: Payer: Self-pay | Admitting: Family Medicine

## 2020-03-16 NOTE — Telephone Encounter (Signed)
Rx request 

## 2020-03-16 NOTE — Telephone Encounter (Signed)
Pt needs OV for further refills.  

## 2020-03-16 NOTE — Telephone Encounter (Signed)
Rx Request 

## 2020-03-16 NOTE — Telephone Encounter (Signed)
LAST APPOINTMENT DATE: 09/05/2020  NEXT APPOINTMENT DATE:Visit date not found  Rx Klonopin 0.5mg  LAST REFILL:12/12/2019  QTY#180 O Rf   Rx Colchicine 0.6mg  LAST REFILL 12/17/2019  QTY #60 O Rf

## 2020-03-24 ENCOUNTER — Other Ambulatory Visit: Payer: Self-pay | Admitting: Family Medicine

## 2020-03-29 ENCOUNTER — Other Ambulatory Visit: Payer: Self-pay | Admitting: Family Medicine

## 2020-03-31 ENCOUNTER — Other Ambulatory Visit: Payer: Self-pay

## 2020-03-31 ENCOUNTER — Ambulatory Visit (INDEPENDENT_AMBULATORY_CARE_PROVIDER_SITE_OTHER): Payer: PPO

## 2020-03-31 DIAGNOSIS — Z0000 Encounter for general adult medical examination without abnormal findings: Secondary | ICD-10-CM

## 2020-03-31 DIAGNOSIS — Z1211 Encounter for screening for malignant neoplasm of colon: Secondary | ICD-10-CM | POA: Diagnosis not present

## 2020-03-31 NOTE — Progress Notes (Signed)
This visit is being conducted via phone call due to the COVID-19 pandemic. This patient has given me verbal consent via phone to conduct this visit, patient states they are participating from their home address. Some vital signs may be absent or patient reported.   Patient identification: identified by name, DOB, and current address.  Location provider: [facility], Office Persons participating in the virtual visit: Denman George LPN, patient, and Dr. Dimas Chyle   Subjective:   Jerry York is a 69 y.o. male who presents for Medicare Annual/Subsequent preventive examination.  Review of Systems:   Cardiac Risk Factors include: advanced age (>45mn, >>81women);diabetes mellitus;dyslipidemia;hypertension;male gender;sedentary lifestyle    Objective:    Vitals: There were no vitals taken for this visit.  There is no height or weight on file to calculate BMI.  Advanced Directives 03/31/2020 10/02/2019 09/24/2019 09/20/2019 09/16/2019 09/12/2019 11/29/2018  Does Patient Have a Medical Advance Directive? _0  No No  Would patient like information on creating a medical advance directive? Yes (MAU/Ambulatory/Procedural Areas - Information given) No - Patient declined No - Patient declined No - Patient declined No - Patient declined No - Patient declined Yes (MAU/Ambulatory/Procedural Areas - Information given)    Tobacco Social History   Tobacco Use  Smoking Status Never Smoker  Smokeless Tobacco Never Used     Counseling given: Not Answered   Clinical Intake:  Pre-visit preparation completed: Yes  Diabetes: Yes CBG done?: No Did pt. bring in CBG monitor from home?: No  How often do you need to have someone help you when you read instructions, pamphlets, or other written materials from your doctor or pharmacy?: 1 - Never  Interpreter Needed?: No  Information entered by :: CDenman GeorgeLPN  Past Medical History:  Diagnosis Date  . Anxiety   . Bilateral renal  cysts   . Gout    02-22-2017 acute right foot gout---  per pt resolved  . Gout   . Gross hematuria   . Heart murmur   . History of BPH 08/14/2017  . Hydronephrosis, left   . Hyperlipidemia   . Hypertension   . Renal insufficiency   . [place]umatoid [place]   . Swelling   . Type 2 diabetes mellitus ([place]   . Urinary retention   . Wears glasses    Past Surgical History:  Procedure Laterality Date  . APPENDECTOMY  age 69 . CYSTOSCOPY WITH URETEROSCOPY AND STENT PLACEMENT Left 05/08/2017   Procedure: URETEROSCOPY AND STENT PLACEMENT;  Surgeon: OKathie Rhodes MD;  [facility]  Service: Urology;  Laterality: Left;  . CYSTOSCOPY/RETROGRADE/URETEROSCOPY Bilateral 05/08/2017   Procedure: CYSTOSCOPY/ BILATERAL RETROGRADE;  Surgeon: OKathie Rhodes MD;  [facility]  Service: Urology;  Laterality: Bilateral;  . HEMATOMA EVACUATION N/A 09/20/2019   Procedure: EVACUATION LUMBAR EPIDURAL HEMATOMA;  Surgeon: JNewman Pies MD;  [place]  Service: Neurosurgery;  Laterality: N/A;  EVACUATION LUMBAR EPIDURAL HEMATOMA  . INGUINAL HERNIA REPAIR Right 10/10/2000  . KNEE ARTHROSCOPY Right 1980's  . LUMBAR LAMINECTOMY/DECOMPRESSION MICRODISCECTOMY N/A 09/16/2019   Procedure: Laminectomy and Foraminotomy - Lumbar Two-Lumbar Three - Lumbar Three-Lumbar Four - Lumbar Four-Lumbar Five - Lumbar Five-Sacral One;  Surgeon: JEustace Moore MD;  [facility];  Service: Neurosurgery;  Laterality: N/A;  Laminectomy and Foraminotomy - Lumbar Two-Lumbar Three - Lumbar Three-Lumbar Four - Lumbar Four-Lumbar Five - Lumbar Five-Sacral One  . NASAL FRACTURE SURGERY     in highschool   .  SHOULDER ARTHROSCOPY WITH OPEN ROTATOR CUFF REPAIR Right 1990's  . TRANSURETHRAL RESECTION OF PROSTATE     Family History  Problem Relation Age of Onset  . Congestive Heart Failure Mother        Related to valve disease.  Opted to treat medically  . Hypertension Mother     . Hyperlipidemia Mother   . Diabetes Mother   . Hypertension Father   . Hyperlipidemia Father   . Coronary artery disease Father 22       History of CABG  . Stroke Father   . Hypertension Brother   . Diabetes Brother   . Hypertension Daughter   . Hypertension Son   . Hypertension Brother   . Hypertension Brother    Social History   Socioeconomic History  . Marital status: Married    Spouse name: Malachy Mood  . Number of children: Not on file  . Years of education: Not on file  . Highest education level: Not on file  Occupational History  . Occupation: Retired  Tobacco Use  . Smoking status: Never Smoker  . Smokeless tobacco: Never Used  Substance and Sexual Activity  . Alcohol use: No  . Drug use: No  . Sexual activity: Yes    Birth control/protection: Surgical    Comment: vasectomy  Other Topics Concern  . Not on file  Social History Narrative  . Not on file   Social Determinants of Health   Financial Resource Strain:   . Difficulty of Paying Living Expenses:   Food Insecurity:   . Worried About Charity fundraiser in the Last Year:   . Arboriculturist in the Last Year:   Transportation Needs:   . Film/video editor (Medical):   [name] Lack of Transportation (Non-Medical):   Physical Activity:   . Days of Exercise per Week:   . Minutes of Exercise per Session:   Stress:   . Feeling of Stress :   Social Connections:   . Frequency of Communication with Friends and Family:   . Frequency of Social Gatherings with Friends and Family:   . Attends Religious Services:   . Active Member of Clubs or Organizations:   . Attends Archivist Meetings:   [name] Marital Status:     Outpatient Encounter Medications as of 03/31/2020  Medication Sig  . allopurinol (ZYLOPRIM) 100 MG tablet Take 3 tablets by mouth once daily  . amLODipine (NORVASC) 5 MG tablet Take 1 tablet by mouth once daily  . atorvastatin (LIPITOR) 40 MG tablet Take 1 tablet (40 mg total) by mouth  daily. (Patient taking differently: Take 40 mg by mouth at bedtime. )  . [name] Take 500 mg by mouth 2 (two) times daily.   . clonazePAM (KLONOPIN) 0.5 MG tablet Take 1 tablet by mouth twice daily  . colchicine 0.6 MG tablet TAKE 1 TABLET BY MOUTH DAILY. TAKE 1 TABLET BY MOUTH 2 TIMES A DAY IF EXPERIENCING A FLARE  . Continuous Blood Gluc Sensor ([name] 14 DAY SENSOR) MISC APPLY 1 KIT TOPICALLY EVERY 14 (FOURTEEN) DAYS  . furosemide (LASIX) 80 MG tablet Take 1 tablet (80 mg total) by mouth 2 (two) times daily.  [name] glucose monitoring kit ([name]) monitoring kit 1 each by Does not apply route as needed for other. DX Code: E11.9  . HUMULIN R U-500 KWIKPEN 500 UNIT/ML kwikpen Inject 60-80 Units as directed See admin instructions. INJECT 80 UNITS BEFORE BREAKFAST 60 UNITS BEFORE LUNCH AND 60  UNITS BEFORE EVENING MEAL. 30 MINUTES BEFORE MEALS  . metoprolol tartrate (LOPRESSOR) 100 MG tablet Take 1 tablet by mouth twice daily  . tiZANidine (ZANAFLEX) 2 MG tablet Take 2 mg by mouth at bedtime as needed.  . traZODone (DESYREL) 100 MG tablet Take 2 tablets (200 mg total) by mouth at bedtime as needed for sleep. (Patient taking differently: Take 200 mg by mouth at bedtime. )   No facility-administered encounter medications on file as of 03/31/2020.    Activities of Daily Living In your present state of health, do you have any difficulty performing the following activities: 03/31/2020 09/24/2019  Hearing? N N  Vision? N N  Difficulty concentrating or making decisions? N N  Walking or climbing stairs? Y Y  Comment due to past back surgery -  Dressing or bathing? N N  Doing errands, shopping? Y N  Preparing Food and eating ? N -  Using the Toilet? N -  In the past six months, have you accidently leaked urine? N -  Do you have problems with loss of bowel control? N -  Managing your Medications? N -  Managing your Finances? N -  Housekeeping or managing your Housekeeping? N -  Some recent  data might be hidden    Patient Care Team: [name], MD as PCP - General (Family Medicine) Kerry Fort., MD (Urology) Delrae Rend, MD as Consulting Physician (Endocrinology) Gardiner Barefoot, DPM as Consulting Physician (Podiatry)   Assessment:   This is a routine wellness examination for Brent.  Exercise Activities and Dietary recommendations Current Exercise Habits: The patient does not participate in regular exercise at present  Goals   None     Fall Risk Fall Risk  03/31/2020 11/29/2018 11/23/2018 10/22/2018 02/23/2018  Falls in the past year? 0 1 1 No No  Number falls in past yr: 0 0 0 - -  Injury with Fall? 0 0 1 - -  Risk for fall due to : Impaired balance/gait;Impaired mobility History of fall(s) History of fall(s);Impaired vision;Impaired balance/gait;Medication side effect;Impaired mobility - -  Follow up Falls evaluation completed;Education provided;Falls prevention discussed Falls evaluation completed;Falls prevention discussed Falls evaluation completed;Education provided - -   Is the patient's home free of loose throw rugs in walkways, pet beds, electrical cords, etc?   yes      Grab bars in the bathroom? yes      Handrails on the stairs?   yes      Adequate lighting?   yes   Depression Screen PHQ 2/9 Scores 03/31/2020 01/28/2019 11/29/2018 11/23/2018  PHQ - 2 Score 1 4 0 0  PHQ- 9 Score - 12 - -  Exception Documentation - Medical reason - -    Cognitive Function   6CIT Screen 03/31/2020  What Year? 0 points  What month? 0 points  What time? 0 points  Count back from 20 0 points  Months in reverse 0 points  Repeat phrase 0 points  Total Score 0    Immunization History  Administered Date(s) Administered  . Fluad Quad(high Dose 65+) 10/07/2019  . Influenza, High Dose Seasonal PF 10/12/2018  . Influenza,inj,Quad PF,6+ Mos 09/26/2017  . Influenza-Unspecified 09/26/2017  . Pneumococcal Conjugate-13 11/10/2017  . Tdap 11/02/2018    Qualifies  for Shingles Vaccine? Discussed and patient will check with pharmacy for coverage.  Patient education handout provided   Screening Tests Health Maintenance  Topic Date Due  . Fecal DNA (Cologuard)  Never done  . OPHTHALMOLOGY  EXAM  05/23/2018  . PNA vac Low Risk Adult (2 of 2 - PPSV23) 11/10/2018  . URINE MICROALBUMIN  01/29/2020  . HEMOGLOBIN A1C  05/13/2020  . INFLUENZA VACCINE  07/26/2020  . FOOT EXAM  01/20/2021  . TETANUS/TDAP  11/02/2028  . Hepatitis C Screening  Completed   Cancer Screenings: Lung: Low Dose CT Chest recommended if Age 68-80 years, 30 pack-year currently smoking OR have quit w/in 15years. Patient does not qualify. Colorectal: Cologuard orders placed   Plan:  I have personally reviewed and addressed the Medicare Annual Wellness questionnaire and have noted the following in the patient's chart:  A. Medical and social history B. Use of alcohol, tobacco or illicit drugs  C. Current medications and supplements D. Functional ability and status E.  Nutritional status F.  Physical activity G. Advance directives H. List of other physicians I.  Hospitalizations, surgeries, and ER visits in previous 12 months J.  Alpha such as hearing and vision if needed, cognitive and depression L. Referrals, records requested, and appointments- Cologuard ordered   In addition, I have reviewed and discussed with patient certain preventive protocols, quality metrics, and best practice recommendations. A written personalized care plan for preventive services as well as general preventive health recommendations were provided to patient.   Signed,  Denman George, LPN  Nurse Health Advisor   Nurse Notes: Patient has completed Covid vaccine Therapist, music)

## 2020-03-31 NOTE — Patient Instructions (Signed)
[name] , Thank you for taking time to come for your Medicare Wellness Visit. I appreciate your ongoing commitment to your health goals. Please review the following plan we discussed and let me know if I can assist you in the future.   Screening recommendations/referrals: Colorectal Screening: recommended; new Cologuard orders placed   Vision and Dental Exams: Recommended annual ophthalmology exams for early detection of glaucoma and other disorders of the eye Recommended annual dental exams for proper oral hygiene  Diabetic Exams: Diabetic Eye Exam: recommended yearly  Diabetic Foot Exam: recommended yearly   Vaccinations: Influenza vaccine: completed 10/07/19 Pneumococcal vaccine: up to date; last 11/10/17 Tdap vaccine: up to date; last 11/02/18  Shingles vaccine:  You may receive this vaccine at your local pharmacy. (see handout)  Covid vaccine: Completed   Advanced directives: Please bring a copy of your POA (Power of Attorney) and/or Living Will to your next appointment.  Goals: Recommend to drink at least 6-8 8oz glasses of water per day and consume a balanced diet rich in fresh fruits and vegetables.   Next appointment: Please schedule your Annual Wellness Visit with your Nurse Health Advisor in one year.  Preventive Care 68 Years and Older, Male Preventive care refers to lifestyle choices and visits with your health care provider that can promote health and wellness. What does preventive care include?  A yearly physical exam. This is also called an annual well check.  Dental exams once or twice a year.  Routine eye exams. Ask your health care provider how often you should have your eyes checked.  Personal lifestyle choices, including:  Daily care of your teeth and gums.  Regular physical activity.  Eating a healthy diet.  Avoiding tobacco and drug use.  Limiting alcohol use.  Practicing safe sex.  Taking low doses of aspirin every day if recommended by  your health care provider..  Taking vitamin and mineral supplements as recommended by your health care provider. What happens during an annual well check? The services and screenings done by your health care provider during your annual well check will depend on your age, overall health, lifestyle risk factors, and family history of disease. Counseling  Your health care provider may ask you questions about your:  Alcohol use.  Tobacco use.  Drug use.  Emotional well-being.  Home and relationship well-being.  Sexual activity.  Eating habits.  History of falls.  Memory and ability to understand (cognition).  Work and work Astronomer. Screening  You may have the following tests or measurements:  Height, weight, and BMI.  Blood pressure.  Lipid and cholesterol levels. These may be checked every 5 years, or more frequently if you are over 50 years old.  Skin check.  Lung cancer screening. You may have this screening every year starting at age 64 if you have a 30-pack-year history of smoking and currently smoke or have quit within the past 15 years.  Fecal occult blood test (FOBT) of the stool. You may have this test every year starting at age 37.  Flexible sigmoidoscopy or colonoscopy. You may have a sigmoidoscopy every 5 years or a colonoscopy every 10 years starting at age 82.  Prostate cancer screening. Recommendations will vary depending on your family history and other risks.  Hepatitis C blood test.  Hepatitis B blood test.  Sexually transmitted disease (STD) testing.  Diabetes screening. This is done by checking your blood sugar (glucose) after you have not eaten for a while (fasting). You may have this  done every 1-3 years.  Abdominal aortic aneurysm (AAA) screening. You may need this if you are a current or former smoker.  Osteoporosis. You may be screened starting at age 42 if you are at high risk. Talk with your health care provider about your test  results, treatment options, and if necessary, the need for more tests. Vaccines  Your health care provider may recommend certain vaccines, such as:  Influenza vaccine. This is recommended every year.  Tetanus, diphtheria, and acellular pertussis (Tdap, Td) vaccine. You may need a Td booster every 10 years.  Zoster vaccine. You may need this after age 49.  Pneumococcal 13-valent conjugate (PCV13) vaccine. One dose is recommended after age 39.  Pneumococcal polysaccharide (PPSV23) vaccine. One dose is recommended after age 49. Talk to your health care provider about which screenings and vaccines you need and how often you need them. This information is not intended to replace advice given to you by your health care provider. Make sure you discuss any questions you have with your health care provider. Document Released: 01/08/2016 Document Revised: 08/31/2016 Document Reviewed: 10/13/2015 Elsevier Interactive Patient Education  2017 [place] Prevention in the Home Falls can cause injuries. They can happen to people of all ages. There are many things you can do to make your home safe and to help prevent falls. What can I do on the outside of my home?  Regularly fix the edges of walkways and driveways and fix any cracks.  Remove anything that might make you trip as you walk through a door, such as a raised step or threshold.  Trim any bushes or trees on the path to your home.  Use bright outdoor lighting.  Clear any walking paths of anything that might make someone trip, such as rocks or tools.  Regularly check to see if handrails are loose or broken. Make sure that both sides of any steps have handrails.  Any raised decks and porches should have guardrails on the edges.  Have any leaves, snow, or ice cleared regularly.  Use sand or salt on walking paths during winter.  Clean up any spills in your garage right away. This includes oil or grease spills. What can I do in  the bathroom?  Use night lights.  Install grab bars by the toilet and in the tub and shower. Do not use towel bars as grab bars.  Use non-skid mats or decals in the tub or shower.  If you need to sit down in the shower, use a plastic, non-slip stool.  Keep the floor dry. Clean up any water that spills on the floor as soon as it happens.  Remove soap buildup in the tub or shower regularly.  Attach bath mats securely with double-sided non-slip rug tape.  Do not have throw rugs and other things on the floor that can make you trip. What can I do in the bedroom?  Use night lights.  Make sure that you have a light by your bed that is easy to reach.  Do not use any sheets or blankets that are too big for your bed. They should not hang down onto the floor.  Have a firm chair that has side arms. You can use this for support while you get dressed.  Do not have throw rugs and other things on the floor that can make you trip. What can I do in the kitchen?  Clean up any spills right away.  Avoid walking on wet floors.  Keep items that you use a lot in easy-to-reach places.  If you need to reach something above you, use a strong step stool that has a grab bar.  Keep electrical cords out of the way.  Do not use floor polish or wax that makes floors slippery. If you must use wax, use non-skid floor wax.  Do not have throw rugs and other things on the floor that can make you trip. What can I do with my stairs?  Do not leave any items on the stairs.  Make sure that there are handrails on both sides of the stairs and use them. Fix handrails that are broken or loose. Make sure that handrails are as long as the stairways.  Check any carpeting to make sure that it is firmly attached to the stairs. Fix any carpet that is loose or worn.  Avoid having throw rugs at the top or bottom of the stairs. If you do have throw rugs, attach them to the floor with carpet tape.  Make sure that you  have a light switch at the top of the stairs and the bottom of the stairs. If you do not have them, ask someone to add them for you. What else can I do to help prevent falls?  Wear shoes that:  Do not have high heels.  Have rubber bottoms.  Are comfortable and fit you well.  Are closed at the toe. Do not wear sandals.  If you use a stepladder:  Make sure that it is fully opened. Do not climb a closed stepladder.  Make sure that both sides of the stepladder are locked into place.  Ask someone to hold it for you, if possible.  Clearly mark and make sure that you can see:  Any grab bars or handrails.  First and last steps.  Where the edge of each step is.  Use tools that help you move around (mobility aids) if they are needed. These include:  Canes.  Walkers.  Scooters.  Crutches.  Turn on the lights when you go into a dark area. Replace any light bulbs as soon as they burn out.  Set up your furniture so you have a clear path. Avoid moving your furniture around.  If any of your floors are uneven, fix them.  If there are any pets around you, be aware of where they are.  Review your medicines with your doctor. Some medicines can make you feel dizzy. This can increase your chance of falling. Ask your doctor what other things that you can do to help prevent falls. This information is not intended to replace advice given to you by your health care provider. Make sure you discuss any questions you have with your health care provider. Document Released: 10/08/2009 Document Revised: 05/19/2016 Document Reviewed: 01/16/2015 Elsevier Interactive Patient Education  2017 [employer].

## 2020-04-09 ENCOUNTER — Other Ambulatory Visit: Payer: Self-pay | Admitting: Family Medicine

## 2020-04-09 DIAGNOSIS — I10 Essential (primary) hypertension: Secondary | ICD-10-CM | POA: Diagnosis not present

## 2020-04-09 DIAGNOSIS — M48062 Spinal stenosis, lumbar region with neurogenic claudication: Secondary | ICD-10-CM | POA: Diagnosis not present

## 2020-04-09 DIAGNOSIS — Z6829 Body mass index (BMI) 29.0-29.9, adult: Secondary | ICD-10-CM | POA: Diagnosis not present

## 2020-04-13 DIAGNOSIS — Z1211 Encounter for screening for malignant neoplasm of colon: Secondary | ICD-10-CM | POA: Diagnosis not present

## 2020-04-22 ENCOUNTER — Encounter: Payer: Self-pay | Admitting: Family Medicine

## 2020-04-22 ENCOUNTER — Other Ambulatory Visit: Payer: Self-pay

## 2020-04-22 ENCOUNTER — Ambulatory Visit (INDEPENDENT_AMBULATORY_CARE_PROVIDER_SITE_OTHER): Payer: PPO | Admitting: Family Medicine

## 2020-04-22 VITALS — BP 120/80 | HR 62 | Temp 98.2°F | Ht 66.0 in

## 2020-04-22 DIAGNOSIS — E1159 Type 2 diabetes mellitus with other circulatory complications: Secondary | ICD-10-CM

## 2020-04-22 DIAGNOSIS — Z794 Long term (current) use of insulin: Secondary | ICD-10-CM | POA: Diagnosis not present

## 2020-04-22 DIAGNOSIS — I10 Essential (primary) hypertension: Secondary | ICD-10-CM | POA: Diagnosis not present

## 2020-04-22 DIAGNOSIS — N183 Chronic kidney disease, stage 3 unspecified: Secondary | ICD-10-CM | POA: Diagnosis not present

## 2020-04-22 DIAGNOSIS — M47816 Spondylosis without myelopathy or radiculopathy, lumbar region: Secondary | ICD-10-CM | POA: Diagnosis not present

## 2020-04-22 DIAGNOSIS — E1169 Type 2 diabetes mellitus with other specified complication: Secondary | ICD-10-CM

## 2020-04-22 DIAGNOSIS — M48062 Spinal stenosis, lumbar region with neurogenic claudication: Secondary | ICD-10-CM | POA: Diagnosis not present

## 2020-04-22 DIAGNOSIS — E1122 Type 2 diabetes mellitus with diabetic chronic kidney disease: Secondary | ICD-10-CM | POA: Diagnosis not present

## 2020-04-22 DIAGNOSIS — E785 Hyperlipidemia, unspecified: Secondary | ICD-10-CM

## 2020-04-22 DIAGNOSIS — F419 Anxiety disorder, unspecified: Secondary | ICD-10-CM | POA: Diagnosis not present

## 2020-04-22 DIAGNOSIS — M5126 Other intervertebral disc displacement, lumbar region: Secondary | ICD-10-CM | POA: Diagnosis not present

## 2020-04-22 DIAGNOSIS — I152 Hypertension secondary to endocrine disorders: Secondary | ICD-10-CM

## 2020-04-22 DIAGNOSIS — M48061 Spinal stenosis, lumbar region without neurogenic claudication: Secondary | ICD-10-CM | POA: Diagnosis not present

## 2020-04-22 LAB — COMPREHENSIVE METABOLIC PANEL
ALT: 41 U/L (ref 0–53)
AST: 30 U/L (ref 0–37)
Albumin: 3.9 g/dL (ref 3.5–5.2)
Alkaline Phosphatase: 76 U/L (ref 39–117)
BUN: 30 mg/dL — ABNORMAL HIGH (ref 6–23)
CO2: 32 mEq/L (ref 19–32)
Calcium: 8.7 mg/dL (ref 8.4–10.5)
Chloride: 103 mEq/L (ref 96–112)
Creatinine, Ser: 1.55 mg/dL — ABNORMAL HIGH (ref 0.40–1.50)
GFR: 44.79 mL/min — ABNORMAL LOW (ref >60.00)
Glucose, Bld: 159 mg/dL — ABNORMAL HIGH (ref 70–99)
Potassium: 3.9 mEq/L (ref 3.5–5.1)
Sodium: 143 mEq/L (ref 135–145)
Total Bilirubin: 0.7 mg/dL (ref 0.2–1.2)
Total Protein: 6.1 g/dL (ref 6.0–8.3)

## 2020-04-22 LAB — LDL CHOLESTEROL, DIRECT: Direct LDL: 37 mg/dL

## 2020-04-22 LAB — MICROALBUMIN / CREATININE URINE RATIO
Creatinine,U: 55.3 mg/dL
Microalb Creat Ratio: 8.8 mg/g (ref 0.0–30.0)
Microalb, Ur: 4.9 mg/dL — ABNORMAL HIGH (ref 0.0–1.9)

## 2020-04-22 LAB — LIPID PANEL
Cholesterol: 153 mg/dL (ref 0–200)
HDL: 22.1 mg/dL — ABNORMAL LOW (ref >39.00)
Total CHOL/HDL Ratio: 7
Triglycerides: 456 mg/dL — ABNORMAL HIGH (ref 0.0–149.0)

## 2020-04-22 LAB — HEMOGLOBIN A1C: Hgb A1c MFr Bld: 6.8 % — ABNORMAL HIGH (ref 4.6–6.5)

## 2020-04-22 LAB — TSH: TSH: 4.48 u[IU]/mL (ref 0.35–4.50)

## 2020-04-22 NOTE — Assessment & Plan Note (Signed)
Continue management per endocrinology.  Currently on Humulin R 3 times daily with meals.  Will check A1c today with blood draw.

## 2020-04-22 NOTE — Assessment & Plan Note (Signed)
At goal.  Continue amlodipine 5 mg daily and metoprolol tartrate 100 mg twice daily. °

## 2020-04-22 NOTE — Assessment & Plan Note (Addendum)
Elevated triglycerides on last check.  Will repeat lipid panel today.  Continue Lipitor 40 mg daily.

## 2020-04-22 NOTE — Progress Notes (Signed)
   Jerry York is a 69 y.o. male who presents today for an office visit.  Assessment/Plan:  Chronic Problems Addressed Today: Anxiety with insomnia Stable.  Continue trazodone 200 mg daily and Klonopin 0.5 mg twice daily as needed.  Type 2 diabetes mellitus (HCC) Continue management per endocrinology.  Currently on Humulin R 3 times daily with meals.  Will check A1c today with blood draw.  Hypertension associated with diabetes (HCC) At goal.  Continue amlodipine 5 mg daily and metoprolol tartrate 100 mg twice daily.  Hyperlipidemia associated with type 2 diabetes mellitus (HCC) Elevated triglycerides on last check.  Will repeat lipid panel today.  Continue Lipitor 40 mg daily.    Subjective:  HPI:  Patient last seen 6 months ago..  Surgery was complicated by postoperative hematoma.  He has had improvement with his bilateral lower leg pain however still has some back pain.  He will be following up with neurosurgery later this week.  Also endocrinologist about 5 or 6 months ago.  States that he has been doing well with his blood sugars.  Is otherwise been doing well with rest of his medications.  He has been taking Klonopin 0.5 mg twice daily as needed and tolerating well.       Objective:  Physical Exam: BP 120/80 (BP Location: Left Arm, Patient Position: Sitting, Cuff Size: Normal)   Pulse 62   Temp 98.2 F (36.8 C) (Temporal)   Ht 5\' 6"  (1.676 m)   SpO2 94%   BMI 29.89 kg/m   Gen: No acute distress, resting comfortably CV: Regular rate and rhythm with no murmurs appreciated Pulm: Normal work of breathing, clear to auscultation bilaterally with no crackles, wheezes, or rhonchi Neuro: Grossly normal, moves all extremities Psych: Normal affect and thought content      Ashford Clouse M. , MD 04/22/2020 10:02 AM

## 2020-04-22 NOTE — Patient Instructions (Signed)
It was very nice to see you today!  No medication changes today.  We will check blood work.  Back in 6 months, or sooner if needed.  Take care, Dr [name]  Please try these tips to maintain a healthy lifestyle:   Eat at least 3 REAL meals and 1-2 snacks per day.  Aim for no more than 5 hours between eating.  If you eat breakfast, please do so within one hour of getting up.    Each meal should contain half fruits/vegetables, one quarter protein, and one quarter carbs (no bigger than a computer mouse)   Cut down on sweet beverages. This includes juice, soda, and sweet tea.     Drink at least 1 glass of water with each meal and aim for at least 8 glasses per day   Exercise at least 150 minutes every week.

## 2020-04-22 NOTE — Assessment & Plan Note (Signed)
Stable.  Continue trazodone 200 mg daily and Klonopin 0.5 mg twice daily as needed. 

## 2020-04-23 DIAGNOSIS — Z6829 Body mass index (BMI) 29.0-29.9, adult: Secondary | ICD-10-CM | POA: Diagnosis not present

## 2020-04-23 DIAGNOSIS — R030 Elevated blood-pressure reading, without diagnosis of hypertension: Secondary | ICD-10-CM | POA: Diagnosis not present

## 2020-04-23 DIAGNOSIS — M48062 Spinal stenosis, lumbar region with neurogenic claudication: Secondary | ICD-10-CM | POA: Diagnosis not present

## 2020-04-23 LAB — CBC
HCT: 44 % (ref 39.0–52.0)
Hemoglobin: 15.1 g/dL (ref 13.0–17.0)
MCHC: 34.3 g/dL (ref 30.0–36.0)
MCV: 90.2 fl (ref 78.0–100.0)
Platelets: 115 10*3/uL — ABNORMAL LOW (ref 150.0–400.0)
RBC: 4.88 Mil/uL (ref 4.22–5.81)
RDW: 17.4 % — ABNORMAL HIGH (ref 11.5–15.5)
WBC: 5.2 10*3/uL (ref 4.0–10.5)

## 2020-04-24 NOTE — Progress Notes (Signed)
Please inform patient of the following:  Triglycerides are much better. All of his other labs are stable. A1c is 6.8. Would like for him to keep going with his current treatment plan and we can recheck in a year or so.  Jerry York. Jerry Ralph, MD 04/24/2020 9:52 AM

## 2020-04-25 ENCOUNTER — Other Ambulatory Visit: Payer: Self-pay | Admitting: Family Medicine

## 2020-05-14 DIAGNOSIS — M47816 Spondylosis without myelopathy or radiculopathy, lumbar region: Secondary | ICD-10-CM | POA: Diagnosis not present

## 2020-05-25 ENCOUNTER — Other Ambulatory Visit: Payer: Self-pay | Admitting: Family Medicine

## 2020-05-27 DIAGNOSIS — M47816 Spondylosis without myelopathy or radiculopathy, lumbar region: Secondary | ICD-10-CM | POA: Diagnosis not present

## 2020-06-11 ENCOUNTER — Other Ambulatory Visit: Payer: Self-pay | Admitting: Family Medicine

## 2020-06-11 DIAGNOSIS — M47816 Spondylosis without myelopathy or radiculopathy, lumbar region: Secondary | ICD-10-CM | POA: Diagnosis not present

## 2020-06-11 NOTE — Telephone Encounter (Signed)
Rx Request 

## 2020-06-22 ENCOUNTER — Other Ambulatory Visit: Payer: Self-pay | Admitting: Family Medicine

## 2020-06-23 ENCOUNTER — Other Ambulatory Visit: Payer: Self-pay

## 2020-06-23 ENCOUNTER — Ambulatory Visit (INDEPENDENT_AMBULATORY_CARE_PROVIDER_SITE_OTHER): Payer: PPO | Admitting: Physician Assistant

## 2020-06-23 ENCOUNTER — Encounter: Payer: Self-pay | Admitting: Physician Assistant

## 2020-06-23 VITALS — BP 140/80 | HR 79 | Temp 98.5°F | Ht 66.0 in | Wt 175.2 lb

## 2020-06-23 DIAGNOSIS — R300 Dysuria: Secondary | ICD-10-CM | POA: Diagnosis not present

## 2020-06-23 LAB — POCT URINALYSIS DIPSTICK
Bilirubin, UA: NEGATIVE
Blood, UA: 3
Glucose, UA: NEGATIVE
Ketones, UA: NEGATIVE
Nitrite, UA: NEGATIVE
Protein, UA: POSITIVE — AB
Spec Grav, UA: 1.01 (ref 1.010–1.025)
Urobilinogen, UA: 0.2 E.U./dL
pH, UA: 8.5 — AB (ref 5.0–8.0)

## 2020-06-23 MED ORDER — CEPHALEXIN 500 MG PO CAPS
500.0000 mg | ORAL_CAPSULE | Freq: Three times a day (TID) | ORAL | 0 refills | Status: AC
Start: 2020-06-23 — End: 2020-06-28

## 2020-06-23 NOTE — Progress Notes (Signed)
Jerry York is a 69 y.o. male here for a new problem.  I acted as a Education administrator for [employer], PA-C Anselmo Pickler, LPN   History of Present Illness:   Chief Complaint  Patient presents with  . Dysuria  . Urinary Frequency  . Nocturia    HPI   Urinary symptoms Pt c/o urinary frequency, dysuria and nocturia since yesterday. Denies hematuria, back pain or fever or chills. He has not taken any medications for his symptoms.  Has significant PMHx of kidney disease.    Past Medical History:  Diagnosis Date  . Anxiety   . Bilateral renal cysts   . Gout    02-22-2017 acute right foot gout---  per pt resolved  . Gout   . Gross hematuria   . Heart murmur   . History of BPH 08/14/2017  . Hydronephrosis, left   . Hyperlipidemia   . Hypertension   . Renal insufficiency   . Rheumatoid arthritis ([place])   . Swelling   . Type 2 diabetes mellitus ([place])   . Urinary retention   . Wears glasses      Social History   Tobacco Use  . Smoking status: Never Smoker  . Smokeless tobacco: Never Used  Vaping Use  . Vaping Use: Never used  Substance Use Topics  . Alcohol use: No  . Drug use: No    Past Surgical History:  Procedure Laterality Date  . APPENDECTOMY  age 30  . CYSTOSCOPY WITH URETEROSCOPY AND STENT PLACEMENT Left 05/08/2017   Procedure: URETEROSCOPY AND STENT PLACEMENT;  Surgeon: Kathie Rhodes, MD;  Location: [facility];  Service: Urology;  Laterality: Left;  . CYSTOSCOPY/RETROGRADE/URETEROSCOPY Bilateral 05/08/2017   Procedure: CYSTOSCOPY/ BILATERAL RETROGRADE;  Surgeon: Kathie Rhodes, MD;  Location[facility];  Service: Urology;  Laterality: Bilateral;  . HEMATOMA EVACUATION N/A 09/20/2019   Procedure: EVACUATION LUMBAR EPIDURAL HEMATOMA;  Surgeon: Newman Pies, MD;  Location: [place];  Service: Neurosurgery;  Laterality: N/A;  EVACUATION LUMBAR EPIDURAL HEMATOMA  . INGUINAL HERNIA REPAIR Right 10/10/2000  . KNEE ARTHROSCOPY Right  1980's  . LUMBAR LAMINECTOMY/DECOMPRESSION MICRODISCECTOMY N/A 09/16/2019   Procedure: Laminectomy and Foraminotomy - Lumbar Two-Lumbar Three - Lumbar Three-Lumbar Four - Lumbar Four-Lumbar Five - Lumbar Five-Sacral One;  Surgeon: Eustace Moore, MD;  Location: [facility];  Service: Neurosurgery;  Laterality: N/A;  Laminectomy and Foraminotomy - Lumbar Two-Lumbar Three - Lumbar Three-Lumbar Four - Lumbar Four-Lumbar Five - Lumbar Five-Sacral One  . NASAL FRACTURE SURGERY     in highschool   . SHOULDER ARTHROSCOPY WITH OPEN ROTATOR CUFF REPAIR Right 1990's  . TRANSURETHRAL RESECTION OF PROSTATE      Family History  Problem Relation Age of Onset  . Congestive Heart Failure Mother        Related to valve disease.  Opted to treat medically  . Hypertension Mother   . Hyperlipidemia Mother   . Diabetes Mother   . Hypertension Father   . Hyperlipidemia Father   . Coronary artery disease Father 48       History of CABG  . Stroke Father   . Hypertension Brother   . Diabetes Brother   . Hypertension Daughter   . Hypertension Son   . Hypertension Brother   . Hypertension Brother     Allergies  Allergen Reactions  . Prednisone Other (See Comments)    Elevated blood sugar  . Sulfa Antibiotics Other (See Comments)    "make my feet burn"  .  Azithromycin Nausea And Vomiting  . Hydrocodone-Acetaminophen Itching  . Metformin And Related Diarrhea    Current Medications:   Current Outpatient Medications:  .  allopurinol (ZYLOPRIM) 100 MG tablet, Take 3 tablets by mouth once daily, Disp: 90 tablet, Rfl: 0 .  amLODipine (NORVASC) 5 MG tablet, Take 1 tablet by mouth once daily, Disp: 90 tablet, Rfl: 1 .  atorvastatin (LIPITOR) 40 MG tablet, Take 1 tablet (40 mg total) by mouth daily. (Patient taking differently: Take 40 mg by mouth at bedtime. ), Disp: 90 tablet, Rfl: 3 .  [name], Take 500 mg by mouth 2 (two) times daily. , Disp: , Rfl:  .  clonazePAM (KLONOPIN) 0.5 MG tablet, Take 1 tablet by  mouth twice daily, Disp: 180 tablet, Rfl: 0 .  colchicine 0.6 MG tablet, TAKE 1 TABLET BY MOUTH DAILY. TAKE 1 TABLET BY MOUTH 2 TIMES A DAY IF EXPERIENCING A FLARE, Disp: 60 tablet, Rfl: 0 .  Continuous Blood Gluc Sensor ([name] 14 DAY SENSOR) MISC, APPLY 1 KIT TOPICALLY EVERY 14 (FOURTEEN) DAYS, Disp: 6 each, Rfl: 0 .  furosemide (LASIX) 80 MG tablet, Take 1 tablet by mouth twice daily, Disp: 180 tablet, Rfl: 1 .  glucose monitoring kit ([name]) monitoring kit, 1 each by Does not apply route as needed for other. DX Code: E11.9, Disp: 1 each, Rfl: 0 .  HUMULIN R U-500 KWIKPEN 500 UNIT/ML kwikpen, Inject 60-80 Units as directed See admin instructions. INJECT 80 UNITS BEFORE BREAKFAST 60 UNITS BEFORE LUNCH AND 60 UNITS BEFORE EVENING MEAL. 30 MINUTES BEFORE MEALS, Disp: , Rfl:  .  metoprolol tartrate (LOPRESSOR) 100 MG tablet, Take 1 tablet by mouth twice daily, Disp: 180 tablet, Rfl: 1 .  tiZANidine (ZANAFLEX) 2 MG tablet, Take 2 mg by mouth at bedtime as needed., Disp: , Rfl:  .  traZODone (DESYREL) 100 MG tablet, Take 2 tablets (200 mg total) by mouth at bedtime as needed for sleep. (Patient taking differently: Take 200 mg by mouth at bedtime. ), Disp: 180 tablet, Rfl: 3 .  methocarbamol (ROBAXIN) 500 MG tablet, Take 500 mg by mouth every 6 (six) hours as needed., Disp: , Rfl:    Review of Systems:   ROS  Negative unless otherwise specified per HPI.  Vitals:   Vitals:   06/23/20 1312  BP: 140/80  Pulse: 79  Temp: 98.5 F (36.9 C)  TempSrc: Temporal  SpO2: 94%  Weight: 175 lb 4 oz (79.5 kg)  Height: 5' 6" (1.676 m)     Body mass index is 28.29 kg/m.  Physical Exam:   Physical Exam Vitals and nursing note reviewed.  Constitutional:      General: He is not in acute distress.    Appearance: He is well-developed. He is not ill-appearing or toxic-appearing.  Cardiovascular:     Rate and Rhythm: Normal rate and regular rhythm.     Pulses: Normal pulses.     Heart  sounds: Normal heart sounds, S1 normal and S2 normal.     Comments: No LE edema Pulmonary:     Effort: Pulmonary effort is normal.     Breath sounds: Normal breath sounds.  Abdominal:     Tenderness: There is no right CVA tenderness or left CVA tenderness.  Skin:    General: Skin is warm and dry.  Neurological:     Mental Status: He is alert.     GCS: GCS eye subscore is 4. GCS verbal subscore is 5. GCS motor subscore is 6.  Psychiatric:        Speech: Speech normal.        Behavior: Behavior normal. Behavior is cooperative.     Results for orders placed or performed in visit on 06/23/20  POCT urinalysis dipstick  Result Value Ref Range   Color, UA amber    Clarity, UA cloudy    Glucose, UA Negative Negative   Bilirubin, UA Negative    Ketones, UA Negaative    Spec Grav, UA 1.010 1.010 - 1.025   Blood, UA 3    pH, UA 8.5 (A) 5.0 - 8.0   Protein, UA Positive (A) Negative   Urobilinogen, UA 0.2 0.2 or 1.0 E.U./dL   Nitrite, UA Negative    Leukocytes, UA Large (3+) (A) Negative   Appearance     Odor      Assessment and Plan:   Kristopher was seen today for dysuria, urinary frequency and nocturia.  Diagnoses and all orders for this visit:  Dysuria -     POCT urinalysis dipstick -     Urine Culture   Suspect acute cystitis, will treat with oral keflex per orders. Urine culture pending. Worsening precautions advised, no red flags on exam.   . Reviewed expectations re: course of current medical issues. . Discussed self-management of symptoms. . Outlined signs and symptoms indicating need for more acute intervention. . Patient verbalized understanding and all questions were answered. . See orders for this visit as documented in the electronic medical record. . Patient received an After-Visit Summary.  CMA or LPN served as scribe during this visit. History, Physical, and Plan performed by medical provider. The above documentation has been reviewed and is accurate and  complete.  Inda Coke, PA-C

## 2020-06-23 NOTE — Patient Instructions (Signed)
It was great to see you!  Please start the oral keflex antibiotic.  General instructions  Make sure you: ? Pee until your bladder is empty. ? Do not hold pee for a long time. ? Empty your bladder after sex. ? Wipe from front to back after pooping if you are a male. Use each tissue one time when you wipe.  Drink enough fluid to keep your pee pale yellow.  Keep all follow-up visits as told by your doctor. This is important. Contact a doctor if:  You do not get better after 1-2 days.  Your symptoms go away and then come back. Get help right away if:  You have very bad back pain.  You have very bad pain in your lower belly.  You have a fever.  You are sick to your stomach (nauseous).  You are throwing up.

## 2020-06-26 ENCOUNTER — Other Ambulatory Visit: Payer: Self-pay

## 2020-06-26 ENCOUNTER — Encounter (HOSPITAL_COMMUNITY): Payer: Self-pay | Admitting: Gynecology

## 2020-06-26 ENCOUNTER — Telehealth: Payer: Self-pay

## 2020-06-26 ENCOUNTER — Ambulatory Visit (HOSPITAL_COMMUNITY)
Admission: EM | Admit: 2020-06-26 | Discharge: 2020-06-26 | Disposition: A | Discharge status: Home / Self Care | Payer: PPO | Attending: Urgent Care | Admitting: Urgent Care

## 2020-06-26 ENCOUNTER — Telehealth: Payer: Self-pay | Admitting: Family Medicine

## 2020-06-26 ENCOUNTER — Ambulatory Visit (HOSPITAL_COMMUNITY): Payer: Self-pay

## 2020-06-26 DIAGNOSIS — T148XXA Other injury of unspecified body region, initial encounter: Secondary | ICD-10-CM

## 2020-06-26 DIAGNOSIS — W57XXXA Bitten or stung by nonvenomous insect and other nonvenomous arthropods, initial encounter: Secondary | ICD-10-CM | POA: Insufficient documentation

## 2020-06-26 DIAGNOSIS — Z882 Allergy status to sulfonamides status: Secondary | ICD-10-CM | POA: Diagnosis not present

## 2020-06-26 DIAGNOSIS — Z833 Family history of diabetes mellitus: Secondary | ICD-10-CM | POA: Insufficient documentation

## 2020-06-26 DIAGNOSIS — E119 Type 2 diabetes mellitus without complications: Secondary | ICD-10-CM | POA: Insufficient documentation

## 2020-06-26 DIAGNOSIS — Z881 Allergy status to other antibiotic agents status: Secondary | ICD-10-CM | POA: Insufficient documentation

## 2020-06-26 DIAGNOSIS — R109 Unspecified abdominal pain: Secondary | ICD-10-CM | POA: Diagnosis not present

## 2020-06-26 DIAGNOSIS — Z885 Allergy status to narcotic agent status: Secondary | ICD-10-CM | POA: Diagnosis not present

## 2020-06-26 DIAGNOSIS — S70362A Insect bite (nonvenomous), left thigh, initial encounter: Secondary | ICD-10-CM | POA: Insufficient documentation

## 2020-06-26 DIAGNOSIS — Z794 Long term (current) use of insulin: Secondary | ICD-10-CM | POA: Insufficient documentation

## 2020-06-26 DIAGNOSIS — N10 Acute tubulo-interstitial nephritis: Secondary | ICD-10-CM | POA: Diagnosis not present

## 2020-06-26 DIAGNOSIS — I10 Essential (primary) hypertension: Secondary | ICD-10-CM | POA: Insufficient documentation

## 2020-06-26 DIAGNOSIS — M549 Dorsalgia, unspecified: Secondary | ICD-10-CM | POA: Diagnosis not present

## 2020-06-26 DIAGNOSIS — F419 Anxiety disorder, unspecified: Secondary | ICD-10-CM | POA: Diagnosis not present

## 2020-06-26 DIAGNOSIS — Z79899 Other long term (current) drug therapy: Secondary | ICD-10-CM | POA: Diagnosis not present

## 2020-06-26 DIAGNOSIS — N400 Enlarged prostate without lower urinary tract symptoms: Secondary | ICD-10-CM | POA: Insufficient documentation

## 2020-06-26 DIAGNOSIS — E785 Hyperlipidemia, unspecified: Secondary | ICD-10-CM | POA: Insufficient documentation

## 2020-06-26 DIAGNOSIS — Z8349 Family history of other endocrine, nutritional and metabolic diseases: Secondary | ICD-10-CM | POA: Insufficient documentation

## 2020-06-26 DIAGNOSIS — M069 Rheumatoid arthritis, unspecified: Secondary | ICD-10-CM | POA: Insufficient documentation

## 2020-06-26 DIAGNOSIS — Z888 Allergy status to other drugs, medicaments and biological substances status: Secondary | ICD-10-CM | POA: Insufficient documentation

## 2020-06-26 DIAGNOSIS — Z8249 Family history of ischemic heart disease and other diseases of the circulatory system: Secondary | ICD-10-CM | POA: Insufficient documentation

## 2020-06-26 LAB — URINE CULTURE
MICRO NUMBER:: 10647377
SPECIMEN QUALITY:: ADEQUATE

## 2020-06-26 MED ORDER — CIPROFLOXACIN HCL 500 MG PO TABS
500.0000 mg | ORAL_TABLET | Freq: Two times a day (BID) | ORAL | 0 refills | Status: DC
Start: 2020-06-26 — End: 2020-08-04

## 2020-06-26 MED ORDER — TRIAMCINOLONE ACETONIDE 0.1 % EX CREA
1.0000 | TOPICAL_CREAM | Freq: Two times a day (BID) | CUTANEOUS | 0 refills | Status: DC
Start: 2020-06-26 — End: 2020-08-04

## 2020-06-26 MED ORDER — TRIAMCINOLONE ACETONIDE 0.1 % EX CREA
1.0000 | TOPICAL_CREAM | Freq: Two times a day (BID) | CUTANEOUS | 0 refills | Status: DC
Start: 2020-06-26 — End: 2020-06-26

## 2020-06-26 MED ORDER — CIPROFLOXACIN HCL 500 MG PO TABS
500.0000 mg | ORAL_TABLET | Freq: Two times a day (BID) | ORAL | 0 refills | Status: DC
Start: 2020-06-26 — End: 2020-06-26

## 2020-06-26 NOTE — Telephone Encounter (Signed)
Patient called and states he had been taking his kidney infection mediation and has had a fever and would like to know if he should be concerned he would like a phone call back if possible

## 2020-06-26 NOTE — Telephone Encounter (Signed)
Dr. Lavone Neri patient, and Lelon Mast is OTO. Urine came back positive currently on antibiotics, but having a fever. Not sure what to tell patient. Please advise. Thanks

## 2020-06-26 NOTE — Telephone Encounter (Signed)
Does anyone have virtual visits available for this PM? Lets get him in if so  If not give me more info - is fever new today? How high is it?  - any other possible source of fever? Covid contact? -Are urinary symptoms improving -does he feel poorly overall?

## 2020-06-26 NOTE — ED Triage Notes (Signed)
Per patient was seen by his pcp on Monday for his kidney infection and was given Rx Keflex 500 mg , #15 for 5 days. Per patient with fever x yesterday of 103 and headache and today 101 this afternoon. Patient c/o saw tick embedded in left posterior leg while in the waiting room. Per patient not sure if tick on left leg since yesterday from mowing his lawn.

## 2020-06-26 NOTE — Telephone Encounter (Signed)
Patient did call back saying that he feel bad overall. His fever is 103 degrees F. He is still currently taking his antibiotics when he developed the fever recently. He lost his taste today. Patient was advised to go to urgent care or ED to be seen.

## 2020-06-26 NOTE — ED Provider Notes (Signed)
[place]   MRN: [id number] DOB: Dec 09, 1951  Subjective:   Jerry York is a 69 y.o. male presenting for recheck on pyelonephritis.  Patient states that he still has flank pain and malaise, had subjective fever and sweats last night.  He was seen 4 days ago, started on Keflex for pyelonephritis, confirmed on urinalysis and urine culture.  Patient has a history of severe pyelonephritis, became septic and had to be hospitalized.  He reports that he feels like he is heading in the same direction now.  He also has a tick on his left posterior thigh that he would like removed.  No current facility-administered medications for this encounter.  Current Outpatient Medications:  .  allopurinol (ZYLOPRIM) 100 MG tablet, Take 3 tablets by mouth once daily, Disp: 90 tablet, Rfl: 0 .  amLODipine (NORVASC) 5 MG tablet, Take 1 tablet by mouth once daily, Disp: 90 tablet, Rfl: 1 .  atorvastatin (LIPITOR) 40 MG tablet, Take 1 tablet (40 mg total) by mouth daily. (Patient taking differently: Take 40 mg by mouth at bedtime. ), Disp: 90 tablet, Rfl: 3 .  cephALEXin (KEFLEX) 500 MG capsule, Take 1 capsule (500 mg total) by mouth 3 (three) times daily for 5 days., Disp: 15 capsule, Rfl: 0 .  [name], Take 500 mg by mouth 2 (two) times daily. , Disp: , Rfl:  .  clonazePAM (KLONOPIN) 0.5 MG tablet, Take 1 tablet by mouth twice daily, Disp: 180 tablet, Rfl: 0 .  colchicine 0.6 MG tablet, TAKE 1 TABLET BY MOUTH DAILY. TAKE 1 TABLET BY MOUTH 2 TIMES A DAY IF EXPERIENCING A FLARE, Disp: 60 tablet, Rfl: 0 .  Continuous Blood Gluc Sensor ([name] 14 DAY SENSOR) MISC, APPLY 1 KIT TOPICALLY EVERY 14 (FOURTEEN) DAYS, Disp: 6 each, Rfl: 0 .  furosemide (LASIX) 80 MG tablet, Take 1 tablet by mouth twice daily, Disp: 180 tablet, Rfl: 1 .  glucose monitoring kit ([name]) monitoring kit, 1 each by Does not apply route as needed for other. DX Code: E11.9, Disp: 1 each, Rfl: 0 .  HUMULIN R U-500 KWIKPEN  500 UNIT/ML kwikpen, Inject 60-80 Units as directed See admin instructions. INJECT 80 UNITS BEFORE BREAKFAST 60 UNITS BEFORE LUNCH AND 60 UNITS BEFORE EVENING MEAL. 30 MINUTES BEFORE MEALS, Disp: , Rfl:  .  methocarbamol (ROBAXIN) 500 MG tablet, Take 500 mg by mouth every 6 (six) hours as needed., Disp: , Rfl:  .  metoprolol tartrate (LOPRESSOR) 100 MG tablet, Take 1 tablet by mouth twice daily, Disp: 180 tablet, Rfl: 1 .  tiZANidine (ZANAFLEX) 2 MG tablet, Take 2 mg by mouth at bedtime as needed., Disp: , Rfl:  .  traZODone (DESYREL) 100 MG tablet, Take 2 tablets (200 mg total) by mouth at bedtime as needed for sleep. (Patient taking differently: Take 200 mg by mouth at bedtime. ), Disp: 180 tablet, Rfl: 3   Allergies  Allergen Reactions  . Prednisone Other (See Comments)    Elevated blood sugar  . Sulfa Antibiotics Other (See Comments)    "make my feet burn"  . Azithromycin Nausea And Vomiting  . Hydrocodone-Acetaminophen Itching  . Metformin And Related Diarrhea    Past Medical History:  Diagnosis Date  . Anxiety   . Bilateral renal cysts   . Gout    02-22-2017 acute right foot gout---  per pt resolved  . Gout   . Gross hematuria   . Heart murmur   . History of BPH 08/14/2017  .  Hydronephrosis, left   . Hyperlipidemia   . Hypertension   . Renal insufficiency   . Rheumatoid arthritis ([place])   . Swelling   . Type 2 diabetes mellitus ([place])   . Urinary retention   . Wears glasses      Past Surgical History:  Procedure Laterality Date  . APPENDECTOMY  age 37  . CYSTOSCOPY WITH URETEROSCOPY AND STENT PLACEMENT Left 05/08/2017   Procedure: URETEROSCOPY AND STENT PLACEMENT;  Surgeon: Kathie Rhodes, MD;  Location: [facility];  Service: Urology;  Laterality: Left;  . CYSTOSCOPY/RETROGRADE/URETEROSCOPY Bilateral 05/08/2017   Procedure: CYSTOSCOPY/ BILATERAL RETROGRADE;  Surgeon: Kathie Rhodes, MD;  Location: [facility];  Service: Urology;  Laterality:  Bilateral;  . HEMATOMA EVACUATION N/A 09/20/2019   Procedure: EVACUATION LUMBAR EPIDURAL HEMATOMA;  Surgeon: Newman Pies, MD;  Location: [place];  Service: Neurosurgery;  Laterality: N/A;  EVACUATION LUMBAR EPIDURAL HEMATOMA  . INGUINAL HERNIA REPAIR Right 10/10/2000  . KNEE ARTHROSCOPY Right 1980's  . LUMBAR LAMINECTOMY/DECOMPRESSION MICRODISCECTOMY N/A 09/16/2019   Procedure: Laminectomy and Foraminotomy - Lumbar Two-Lumbar Three - Lumbar Three-Lumbar Four - Lumbar Four-Lumbar Five - Lumbar Five-Sacral One;  Surgeon: Eustace Moore, MD;  Location: [facility];  Service: Neurosurgery;  Laterality: N/A;  Laminectomy and Foraminotomy - Lumbar Two-Lumbar Three - Lumbar Three-Lumbar Four - Lumbar Four-Lumbar Five - Lumbar Five-Sacral One  . NASAL FRACTURE SURGERY     in highschool   . SHOULDER ARTHROSCOPY WITH OPEN ROTATOR CUFF REPAIR Right 1990's  . TRANSURETHRAL RESECTION OF PROSTATE      Family History  Problem Relation Age of Onset  . Congestive Heart Failure Mother        Related to valve disease.  Opted to treat medically  . Hypertension Mother   . Hyperlipidemia Mother   . Diabetes Mother   . Hypertension Father   . Hyperlipidemia Father   . Coronary artery disease Father 64       History of CABG  . Stroke Father   . Hypertension Brother   . Diabetes Brother   . Hypertension Daughter   . Hypertension Son   . Hypertension Brother   . Hypertension Brother     Social History   Tobacco Use  . Smoking status: Never Smoker  . Smokeless tobacco: Never Used  Vaping Use  . Vaping Use: Never used  Substance Use Topics  . Alcohol use: No  . Drug use: No    ROS   Objective:   Vitals: BP 135/73 (BP Location: Left Arm)   Pulse 85   Temp 98.9 F (37.2 C) (Oral)   Resp 16   Ht _0  (1.676 m)   Wt 175 lb (79.4 kg)   SpO2 97%   BMI 28.25 kg/m   Physical Exam Constitutional:      General: He is not in acute distress.    Appearance: Normal appearance. He is well-developed  and normal weight. He is not ill-appearing, toxic-appearing or diaphoretic.  HENT:     Head: Normocephalic and atraumatic.     Right Ear: External ear normal.     Left Ear: External ear normal.     Nose: Nose normal.     Mouth/Throat:     Pharynx: Oropharynx is clear.  Eyes:     General: No scleral icterus.       Right eye: No discharge.        Left eye: No discharge.     Extraocular Movements: Extraocular movements intact.  Pupils: Pupils are equal, round, and reactive to light.  Cardiovascular:     Rate and Rhythm: Normal rate.  Pulmonary:     Effort: Pulmonary effort is normal.  Abdominal:     General: Bowel sounds are normal. There is no distension.     Palpations: Abdomen is soft. There is no mass.     Tenderness: There is no abdominal tenderness. There is left CVA tenderness. There is no right CVA tenderness, guarding or rebound.  Musculoskeletal:     Cervical back: Normal range of motion.  Skin:    General: Skin is warm and dry.       Neurological:     Mental Status: He is alert and oriented to person, place, and time.  Psychiatric:        Mood and Affect: Mood normal.        Behavior: Behavior normal.        Thought Content: Thought content normal.        Judgment: Judgment normal.    Urinalysis showed large leukocytes, hematuria.  Recent Results (from the past 2160 hour(s))  CBC     Status: Abnormal   Collection Time: 04/22/20  9:42 AM  Result Value Ref Range   WBC 5.2 4.0 - 10.5 K/uL   RBC 4.88 4.22 - 5.81 Mil/uL   Platelets 115.0 (L) 150 - 400 K/uL   Hemoglobin 15.1 13.0 - 17.0 g/dL   HCT 44.0 39 - 52 %   MCV 90.2 78.0 - 100.0 fl   MCHC 34.3 30.0 - 36.0 g/dL   RDW 17.4 (H) 11.5 - 15.5 %  Comprehensive metabolic panel     Status: Abnormal   Collection Time: 04/22/20  9:42 AM  Result Value Ref Range   Sodium 143 135 - 145 mEq/L   Potassium 3.9 3.5 - 5.1 mEq/L   Chloride 103 96 - 112 mEq/L   CO2 32 19 - 32 mEq/L   Glucose, Bld 159 (H) 70 - 99 mg/dL    BUN 30 (H) 6 - 23 mg/dL   Creatinine, Ser 1.55 (H) 0.40 - 1.50 mg/dL   Total Bilirubin 0.7 0.2 - 1.2 mg/dL   Alkaline Phosphatase 76 39 - 117 U/L   AST 30 0 - 37 U/L   ALT 41 0 - 53 U/L   Total Protein 6.1 6.0 - 8.3 g/dL   Albumin 3.9 3.5 - 5.2 g/dL   GFR 44.79 (L) >60.00 mL/min   Calcium 8.7 8.4 - 10.5 mg/dL  TSH     Status: None   Collection Time: 04/22/20  9:42 AM  Result Value Ref Range   TSH 4.48 0.35 - 4.50 uIU/mL  Lipid panel     Status: Abnormal   Collection Time: 04/22/20  9:42 AM  Result Value Ref Range   Cholesterol 153 0 - 200 mg/dL    Comment: ATP III Classification       Desirable:  < 200 mg/dL               Borderline High:  200 - 239 mg/dL          High:  > = 240 mg/dL   Triglycerides (H) 0 - 149 mg/dL    456.0 Triglyceride is over 400; calculations on Lipids are invalid.    Comment: Normal:  <150 mg/dLBorderline High:  150 - 199 mg/dL   HDL 22.10 (L) >39.00 mg/dL   Total CHOL/HDL Ratio 7     Comment:  Men          Women1/2 Average Risk     3.4          3.3Average Risk          5.0          4.42X Average Risk          9.6          7.13X Average Risk          15.0          11.0                      Hemoglobin A1c     Status: Abnormal   Collection Time: 04/22/20  9:42 AM  Result Value Ref Range   Hgb A1c MFr Bld 6.8 (H) 4.6 - 6.5 %    Comment: Glycemic Control Guidelines for People with Diabetes:Non Diabetic:  <6%Goal of Therapy: <7%Additional Action Suggested:  >8%   LDL cholesterol, direct     Status: None   Collection Time: 04/22/20  9:42 AM  Result Value Ref Range   Direct LDL 37.0 mg/dL    Comment: Optimal:  <100 mg/dLNear or Above Optimal:  100-129 mg/dLBorderline High:  130-159 mg/dLHigh:  160-189 mg/dLVery High:  >190 mg/dL  Urine Microalbumin w/creat. ratio     Status: Abnormal   Collection Time: 04/22/20  9:43 AM  Result Value Ref Range   Microalb, Ur 4.9 (H) 0.0 - 1.9 mg/dL   Creatinine,U 55.3 mg/dL   Microalb Creat Ratio 8.8 0.0 -  30.0 mg/g  POCT urinalysis dipstick     Status: Abnormal   Collection Time: 06/23/20  1:11 PM  Result Value Ref Range   Color, UA amber    Clarity, UA cloudy    Glucose, UA Negative Negative   Bilirubin, UA Negative    Ketones, UA Negaative    Spec Grav, UA 1.010 1.010 - 1.025   Blood, UA 3    pH, UA 8.5 (A) 5.0 - 8.0   Protein, UA Positive (A) Negative    Comment: +2   Urobilinogen, UA 0.2 0.2 or 1.0 E.U./dL   Nitrite, UA Negative    Leukocytes, UA Large (3+) (A) Negative   Appearance     Odor    Urine Culture     Status: Abnormal   Collection Time: 06/23/20  1:28 PM   Specimen: Urine  Result Value Ref Range   MICRO NUMBER: [id number]    SPECIMEN QUALITY: Adequate    Sample Source NOT GIVEN    STATUS: FINAL    ISOLATE 1: Proteus mirabilis (A)     Comment: Greater than 100,000 CFU/mL of Proteus mirabilis      Susceptibility   Proteus mirabilis - URINE CULTURE, REFLEX    AMOX/CLAVULANIC <=2 Sensitive     AMPICILLIN <=2 Sensitive     AMPICILLIN/SULBACTAM <=2 Sensitive     CEFAZOLIN* <=4 Not Reportable      * For infections other than uncomplicated UTIcaused by E. coli, K. pneumoniae or P. mirabilis:Cefazolin is resistant if MIC > or = 8 mcg/mL.(Distinguishing susceptible versus intermediatefor isolates with MIC < or = 4 mcg/mL requiresadditional testing.)For uncomplicated UTI caused by E. coli,K. pneumoniae or P. mirabilis: Cefazolin issusceptible if MIC <32 mcg/mL and predictssusceptible to the oral agents cefaclor, cefdinir,cefpodoxime, cefprozil, cefuroxime, cephalexinand loracarbef.    CEFEPIME <=1 Sensitive     CEFTRIAXONE <=1 Sensitive     CIPROFLOXACIN <=0.25 Sensitive  LEVOFLOXACIN <=0.12 Sensitive     ERTAPENEM <=0.5 Sensitive     GENTAMICIN <=1 Sensitive     IMIPENEM 1 Sensitive     NITROFURANTOIN 64 Resistant     PIP/TAZO <=4 Sensitive     TOBRAMYCIN <=1 Sensitive     TRIMETH/SULFA* <=20 Sensitive      * For infections other than uncomplicated UTIcaused by E.  coli, K. pneumoniae or P. mirabilis:Cefazolin is resistant if MIC > or = 8 mcg/mL.(Distinguishing susceptible versus intermediatefor isolates with MIC < or = 4 mcg/mL requiresadditional testing.)For uncomplicated UTI caused by E. coli,K. pneumoniae or P. mirabilis: Cefazolin issusceptible if MIC <32 mcg/mL and predictssusceptible to the oral agents cefaclor, cefdinir,cefpodoxime, cefprozil, cefuroxime, cephalexinand loracarbef.Legend:S = Susceptible  I = IntermediateR = Resistant  NS = Not susceptible* = Not tested  NR = Not reported**NN = See antimicrobic comments     Assessment and Plan :   PDMP not reviewed this encounter.  1. Acute pyelonephritis   2. Left flank pain   3. Costovertebral angle tenderness   4. Tick bite, initial encounter   5. Bite wound     Patient has concerning signs of ongoing pyelonephritis not responding to cephalosporin.  Will have him stop this, switch to ciprofloxacin given his allergy to sulfa drugs.  Emphasized patient needed to rest, hydrate well.  Tick was removed successfully.  In 1 to 2 days start applying triamcinolone cream if he develops itching for contact dermatitis. Counseled patient on potential for adverse effects with medications prescribed/recommended today, ER and return-to-clinic precautions discussed, patient verbalized understanding.    Jaynee Eagles, PA-C 06/26/20 2009

## 2020-06-26 NOTE — Telephone Encounter (Signed)
Patient called office today stating that he is not feeling well, running a fever of 103, minor pain, and lost his taste. He is still currently on antibiotics for UTI. Patient advised to go to the ED or urgent care due to similar COVID19 symptoms to rule out Covid.

## 2020-06-26 NOTE — ED Notes (Signed)
Urinalysis testing exceeded the limitations of the clinitek capabilities. Notified provider. Urine culture ordered

## 2020-06-28 LAB — URINE CULTURE: Culture: NO GROWTH

## 2020-06-30 NOTE — Telephone Encounter (Signed)
FYI patient was seen in ED notes in chart.

## 2020-07-09 DIAGNOSIS — M545 Low back pain: Secondary | ICD-10-CM | POA: Diagnosis not present

## 2020-07-09 DIAGNOSIS — Z6828 Body mass index (BMI) 28.0-28.9, adult: Secondary | ICD-10-CM | POA: Diagnosis not present

## 2020-07-17 ENCOUNTER — Other Ambulatory Visit: Payer: Self-pay | Admitting: Family Medicine

## 2020-07-21 ENCOUNTER — Encounter: Payer: Self-pay | Admitting: Family Medicine

## 2020-07-21 NOTE — Progress Notes (Signed)
Office Visit Note  Patient: Jerry York             Date of Birth: 06/11/1951           MRN: [id number]             PCP: Ardith Dark, MD Referring: Ardith Dark, MD Visit Date: 08/04/2020 Occupation: @GUAROCC @  Subjective:  Medication management.   History of Present Illness: KYPTON ELTRINGHAM is a 69 y.o. male with history of gout, osteoarthritis and degenerative disc disease.  He denies having any gout flares.  He states he continues to have some discomfort in his right knee joint as it pops in and out.  He had lumbar spine surgery in September of last year.  He states he is a still unable to stand straight.  He is going to physical therapy.  He denies any joint swelling.  Activities of Daily Living:  Patient reports morning stiffness for 3-4 minutes.   Patient Reports nocturnal pain.  Difficulty dressing/grooming: Denies Difficulty climbing stairs: Denies Difficulty getting out of chair: Denies Difficulty using hands for taps, buttons, cutlery, and/or writing: Denies  Review of Systems  Constitutional: Negative for fatigue.  HENT: Positive for mouth dryness. Negative for mouth sores and nose dryness.   Eyes: Negative for itching and dryness.  Respiratory: Negative for shortness of breath and difficulty breathing.   Cardiovascular: Positive for swelling in legs/feet. Negative for chest pain and palpitations.  Gastrointestinal: Negative for blood in stool, constipation and diarrhea.  Endocrine: Negative for increased urination.  Genitourinary: Negative for difficulty urinating.  Musculoskeletal: Positive for arthralgias, joint pain, muscle weakness and morning stiffness. Negative for joint swelling, myalgias, muscle tenderness and myalgias.  Skin: Negative for color change, rash and redness.  Allergic/Immunologic: Negative for susceptible to infections.  Neurological: Negative for dizziness, numbness, headaches, memory loss and weakness.  Hematological: Positive for  bruising/bleeding tendency.  Psychiatric/Behavioral: Negative for confusion.    PMFS History:  Patient Active Problem List   Diagnosis Date Noted  . Pain due to onychomycosis of toenails of both feet 01/21/2020  . Neurogenic bladder 09/25/2019  . Epidural hematoma (HCC) 09/24/2019  . S/P lumbar laminectomy 09/16/2019  . Lower extremity edema 12/11/2017  . Family history of premature CAD 11/27/2017  . 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 Type 2 diabetes mellitus (HCC) 08/09/2017  . Gout 08/09/2017  . Anxiety with insomnia 08/09/2017  . Hyperlipidemia associated with type 2 diabetes mellitus (HCC) 08/08/2017  . Hypertension associated with diabetes (HCC) 08/08/2017    Past Medical History:  Diagnosis Date  . Anxiety   . Bilateral renal cysts   . Gout    02-22-2017 acute right foot gout---  per pt resolved  . Gout   . Gross hematuria   . Heart murmur   . History of BPH 08/14/2017  . Hydronephrosis, left   . Hyperlipidemia   . Hypertension   . Renal insufficiency   . Rheumatoid arthritis (HCC)   . Swelling   . Type 2 diabetes mellitus (HCC)   . Urinary retention   . Wears glasses     Family History  Problem Relation Age of Onset  . Congestive Heart Failure Mother        Related to valve disease.  Opted to treat medically  . Hypertension Mother   . Hyperlipidemia Mother   . Diabetes Mother   . Hypertension Father   . Hyperlipidemia Father   . Coronary artery  disease Father 28       History of CABG  . Stroke Father   . Hypertension Brother   . Diabetes Brother   . Hypertension Daughter   . Hypertension Son   . Hypertension Brother   . Hypertension Brother    Past Surgical History:  Procedure Laterality Date  . APPENDECTOMY  age 20  . CYSTOSCOPY WITH URETEROSCOPY AND STENT PLACEMENT Left 05/08/2017   Procedure: URETEROSCOPY AND STENT PLACEMENT;  Surgeon: Ihor Gully, MD;  Location: [facility];  Service:  Urology;  Laterality: Left;  . CYSTOSCOPY/RETROGRADE/URETEROSCOPY Bilateral 05/08/2017   Procedure: CYSTOSCOPY/ BILATERAL RETROGRADE;  Surgeon: Ihor Gully, MD;  [facility];  Service: Urology;  Laterality: Bilateral;  . HEMATOMA EVACUATION N/A 09/20/2019   Procedure: EVACUATION LUMBAR EPIDURAL HEMATOMA;  Surgeon: Tressie Stalker, MD;  Location: [facility] OR;  Service: Neurosurgery;  Laterality: N/A;  EVACUATION LUMBAR EPIDURAL HEMATOMA  . INGUINAL HERNIA REPAIR Right 10/10/2000  . KNEE ARTHROSCOPY Right 1980's  . LUMBAR LAMINECTOMY/DECOMPRESSION MICRODISCECTOMY N/A 09/16/2019   Procedure: Laminectomy and Foraminotomy - Lumbar Two-Lumbar Three - Lumbar Three-Lumbar Four - Lumbar Four-Lumbar Five - Lumbar Five-Sacral One;  Surgeon: Tia Alert, MD;  Location: [facility] OR;  Service: Neurosurgery;  Laterality: N/A;  Laminectomy and Foraminotomy - Lumbar Two-Lumbar Three - Lumbar Three-Lumbar Four - Lumbar Four-Lumbar Five - Lumbar Five-Sacral One  . NASAL FRACTURE SURGERY     in highschool   . SHOULDER ARTHROSCOPY WITH OPEN ROTATOR CUFF REPAIR Right 1990's  . TRANSURETHRAL RESECTION OF PROSTATE     Social History   Social History Narrative  . Not on file   Immunization History  Administered Date(s) Administered  . Fluad Quad(high Dose 65+) 10/07/2019  . Influenza, High Dose Seasonal PF 10/12/2018  . Influenza,inj,Quad PF,6+ Mos 09/26/2017  . Influenza-Unspecified 09/26/2017  . PFIZER SARS-COV-2 Vaccination 01/20/2020, 02/10/2020  . Pneumococcal Conjugate-13 11/10/2017  . Pneumococcal Polysaccharide-23 12/30/2013  . Tdap 11/02/2018     Objective: Vital Signs: BP (!) 143/75 (BP Location: Left Arm, Patient Position: Sitting, Cuff Size: Small)   Pulse (!) 59   Resp 13   Ht 5\' 6"  (1.676 m)   Wt 178 lb 3.2 oz (80.8 kg)   BMI 28.76 kg/m    Physical Exam Vitals and nursing note reviewed.  Constitutional:      Appearance: He is well-developed.  HENT:     Head:  Normocephalic and atraumatic.  Eyes:     Conjunctiva/sclera: Conjunctivae normal.     Pupils: Pupils are equal, round, and reactive to light.  Cardiovascular:     Rate and Rhythm: Normal rate and regular rhythm.     Heart sounds: Normal heart sounds.  Pulmonary:     Effort: Pulmonary effort is normal.     Breath sounds: Normal breath sounds.  Abdominal:     General: Bowel sounds are normal.     Palpations: Abdomen is soft.     Comments: Large abdominal hernia is noted.  Musculoskeletal:     Cervical back: Normal range of motion and neck supple.  Skin:    General: Skin is warm and dry.     Capillary Refill: Capillary refill takes less than 2 seconds.  Neurological:     Mental Status: He is alert and oriented to person, place, and time.  Psychiatric:        Behavior: Behavior normal.      Musculoskeletal Exam: C-spine was in good range of motion.  He has limited painful range  of motion of his lumbar spine.  Shoulder joints, elbow joints, wrist joints with good range of motion.  He has DIP and PIP thickening with no synovitis.  Knee joints and knee joints with good range of motion.  He had no tenderness over MTPs.  CDAI Exam: CDAI Score: -- Patient Global: --; Provider Global: -- Swollen: --; Tender: -- Joint Exam 08/04/2020   No joint exam has been documented for this visit   There is currently no information documented on the homunculus. Go to the Rheumatology activity and complete the homunculus joint exam.  Investigation: No additional findings.  Imaging: No results found.  Recent Labs: Lab Results  Component Value Date   WBC 5.2 04/22/2020   HGB 15.1 04/22/2020   PLT 115.0 (L) 04/22/2020   NA 143 04/22/2020   K 3.9 04/22/2020   CL 103 04/22/2020   CO2 32 04/22/2020   GLUCOSE 159 (H) 04/22/2020   BUN 30 (H) 04/22/2020   CREATININE 1.55 (H) 04/22/2020   BILITOT 0.7 04/22/2020   ALKPHOS 76 04/22/2020   AST 30 04/22/2020   ALT 41 04/22/2020   PROT 6.1  04/22/2020   ALBUMIN 3.9 04/22/2020   CALCIUM 8.7 04/22/2020   GFRAA 57 (L) 02/04/2020    Speciality Comments: No specialty comments available.  Procedures:  No procedures performed Allergies: Prednisone, Sulfa antibiotics, Azithromycin, Hydrocodone-acetaminophen, and Metformin and related   Assessment / Plan:     Visit Diagnoses: Idiopathic chronic gout of multiple sites without tophus - allopurinol 300 mg by mouth daily and colchicine 0.6 mg 1 tablet by mouth daily.  He may take colchicine on as needed basis which was discussed.  His uric acid is mildly elevated.  He did not have any gout flares.- Plan: Uric acid  Hyperuricemia-his uric acid is better but is still not below 6.0.  Dietary modifications were discussed.  He still consumes alcohol occasionally.  Low purine diet was emphasized.  Medication management - Plan: CBC with Differential/Platelet, COMPLETE METABOLIC PANEL WITH GFR his platelet count has been low.  His GFR has been low.  We will check labs today and then every 6 months.  Primary osteoarthritis of right knee-he continues to have some discomfort in his right knee.  Primary osteoarthritis of right hip-he reports occasional discomfort.  DDD (degenerative disc disease), lumbar - 09/20/19 decompression and fusion surgery, by Dr. Yetta Barre.  He states he is going through physical therapy and instructed to build to extend his back .  He states he has been told is due to muscle spasm.  Essential hypertension-his blood pressure was mildly elevated.  Have advised him to monitor his blood pressure closely.  History of diabetes mellitus  Heart murmur  History of anxiety  History of BPH  Hyperlipidemia associated with type 2 diabetes mellitus (HCC)   He is fully vaccinated against COVID-19.  Have advised him to continue with using mask, practice social distancing and hand hygiene.  In case he develops COVID-19 infection he may be a candidate for monoclonal antibody  infusion.  Orders: Orders Placed This Encounter  Procedures  . CBC with Differential/Platelet  . COMPLETE METABOLIC PANEL WITH GFR  . Uric acid   No orders of the defined types were placed in this encounter.     Follow-Up Instructions: Return in about 6 months (around 02/04/2021) for Gout, Osteoarthritis.   Pollyann Savoy, MD  Note - This record has been created using Animal nutritionist.  Chart creation errors have been sought, but may not always  have been located. Such creation errors do not reflect on  the standard of medical care.

## 2020-07-22 ENCOUNTER — Telehealth: Payer: Self-pay | Admitting: Family Medicine

## 2020-07-22 ENCOUNTER — Other Ambulatory Visit: Payer: Self-pay

## 2020-07-22 MED ORDER — FREESTYLE SYSTEM KIT: 1.0000 | PACK | 0 refills | Status: AC | PRN

## 2020-07-22 NOTE — Telephone Encounter (Signed)
error 

## 2020-07-22 NOTE — Telephone Encounter (Signed)
Patient is requesting the manual test strips, states that the sensors are not working properly and didn't need those yesterday. Wanted to know if he could get another kit to manually check his blood sugar,

## 2020-07-22 NOTE — Telephone Encounter (Signed)
New Glucose meter Kit faxed to the pharmacy

## 2020-07-23 ENCOUNTER — Other Ambulatory Visit: Payer: Self-pay | Admitting: Family Medicine

## 2020-07-24 DIAGNOSIS — E119 Type 2 diabetes mellitus without complications: Secondary | ICD-10-CM | POA: Diagnosis not present

## 2020-07-24 DIAGNOSIS — E785 Hyperlipidemia, unspecified: Secondary | ICD-10-CM | POA: Diagnosis not present

## 2020-07-24 DIAGNOSIS — Z794 Long term (current) use of insulin: Secondary | ICD-10-CM | POA: Diagnosis not present

## 2020-07-24 LAB — HEMOGLOBIN A1C: Hemoglobin A1C: 7

## 2020-07-27 ENCOUNTER — Telehealth: Payer: Self-pay | Admitting: Family Medicine

## 2020-07-27 NOTE — Progress Notes (Signed)
°  Chronic Care Management   Outreach Note  07/27/2020 Name: Jerry York MRN: [id number] DOB: 1951-10-27  Referred by: Ardith Dark, MD Reason for referral : No chief complaint on file.   An unsuccessful telephone outreach was attempted today. The patient was referred to the pharmacist for assistance with care management and care coordination.   Follow Up Plan:   Lynnae January Upstream Scheduler

## 2020-08-03 DIAGNOSIS — M545 Low back pain: Secondary | ICD-10-CM | POA: Diagnosis not present

## 2020-08-04 ENCOUNTER — Ambulatory Visit: Payer: PPO | Admitting: Rheumatology

## 2020-08-04 ENCOUNTER — Other Ambulatory Visit: Payer: Self-pay

## 2020-08-04 ENCOUNTER — Encounter: Payer: Self-pay | Admitting: Rheumatology

## 2020-08-04 VITALS — BP 143/75 | HR 59 | Resp 13 | Ht 66.0 in | Wt 178.2 lb

## 2020-08-04 DIAGNOSIS — I10 Essential (primary) hypertension: Secondary | ICD-10-CM | POA: Diagnosis not present

## 2020-08-04 DIAGNOSIS — M1711 Unilateral primary osteoarthritis, right knee: Secondary | ICD-10-CM

## 2020-08-04 DIAGNOSIS — R011 Cardiac murmur, unspecified: Secondary | ICD-10-CM | POA: Diagnosis not present

## 2020-08-04 DIAGNOSIS — E790 Hyperuricemia without signs of inflammatory arthritis and tophaceous disease: Secondary | ICD-10-CM

## 2020-08-04 DIAGNOSIS — Z87438 Personal history of other diseases of male genital organs: Secondary | ICD-10-CM

## 2020-08-04 DIAGNOSIS — M1611 Unilateral primary osteoarthritis, right hip: Secondary | ICD-10-CM | POA: Diagnosis not present

## 2020-08-04 DIAGNOSIS — E1169 Type 2 diabetes mellitus with other specified complication: Secondary | ICD-10-CM | POA: Diagnosis not present

## 2020-08-04 DIAGNOSIS — M1A09X0 Idiopathic chronic gout, multiple sites, without tophus (tophi): Secondary | ICD-10-CM | POA: Diagnosis not present

## 2020-08-04 DIAGNOSIS — Z79899 Other long term (current) drug therapy: Secondary | ICD-10-CM | POA: Diagnosis not present

## 2020-08-04 DIAGNOSIS — M51369 Other intervertebral disc degeneration, lumbar region without mention of lumbar back pain or lower extremity pain: Secondary | ICD-10-CM

## 2020-08-04 DIAGNOSIS — M5136 Other intervertebral disc degeneration, lumbar region: Secondary | ICD-10-CM

## 2020-08-04 DIAGNOSIS — E785 Hyperlipidemia, unspecified: Secondary | ICD-10-CM

## 2020-08-04 DIAGNOSIS — Z8639 Personal history of other endocrine, nutritional and metabolic disease: Secondary | ICD-10-CM

## 2020-08-04 DIAGNOSIS — Z8659 Personal history of other mental and behavioral disorders: Secondary | ICD-10-CM | POA: Diagnosis not present

## 2020-08-05 LAB — COMPLETE METABOLIC PANEL WITH GFR
AG Ratio: 1.6 (calc) (ref 1.0–2.5)
ALT: 37 U/L (ref 9–46)
AST: 31 U/L (ref 10–35)
Albumin: 3.8 g/dL (ref 3.6–5.1)
Alkaline phosphatase (APISO): 72 U/L (ref 35–144)
BUN: 20 mg/dL (ref 7–25)
CO2: 30 mmol/L (ref 20–32)
Calcium: 8.6 mg/dL (ref 8.6–10.3)
Chloride: 103 mmol/L (ref 98–110)
Creat: 1.25 mg/dL (ref 0.70–1.25)
GFR, Est African American: 68 mL/min/{1.73_m2} (ref >=60)
GFR, Est Non African American: 59 mL/min/{1.73_m2} — ABNORMAL LOW (ref >=60)
Globulin: 2.4 g/dL (calc) (ref 1.9–3.7)
Glucose, Bld: 198 mg/dL — ABNORMAL HIGH (ref 65–99)
Potassium: 3.4 mmol/L — ABNORMAL LOW (ref 3.5–5.3)
Sodium: 143 mmol/L (ref 135–146)
Total Bilirubin: 0.6 mg/dL (ref 0.2–1.2)
Total Protein: 6.2 g/dL (ref 6.1–8.1)

## 2020-08-05 LAB — CBC WITH DIFFERENTIAL/PLATELET
Absolute Monocytes: 261 cells/uL (ref 200–950)
Basophils Absolute: 18 cells/uL (ref 0–200)
Basophils Relative: 0.4 %
Eosinophils Absolute: 50 cells/uL (ref 15–500)
Eosinophils Relative: 1.1 %
HCT: 42.8 % (ref 38.5–50.0)
Hemoglobin: 14.9 g/dL (ref 13.2–17.1)
Lymphs Abs: 1364 cells/uL (ref 850–3900)
MCH: 30.8 pg (ref 27.0–33.0)
MCHC: 34.8 g/dL (ref 32.0–36.0)
MCV: 88.4 fL (ref 80.0–100.0)
MPV: 10.4 fL (ref 7.5–12.5)
Monocytes Relative: 5.8 %
Neutro Abs: 2808 cells/uL (ref 1500–7800)
Neutrophils Relative %: 62.4 %
Platelets: 129 10*3/uL — ABNORMAL LOW (ref 140–400)
RBC: 4.84 10*6/uL (ref 4.20–5.80)
RDW: 15.5 % — ABNORMAL HIGH (ref 11.0–15.0)
Total Lymphocyte: 30.3 %
WBC: 4.5 10*3/uL (ref 3.8–10.8)

## 2020-08-05 LAB — URIC ACID: Uric Acid, Serum: 5.5 mg/dL (ref 4.0–8.0)

## 2020-08-05 NOTE — Progress Notes (Signed)
Uric acid is in desirable range.  Platelets are low and stable.Glucose is elevated.GFR is low and stable.

## 2020-08-06 DIAGNOSIS — M545 Low back pain: Secondary | ICD-10-CM | POA: Diagnosis not present

## 2020-08-11 DIAGNOSIS — M545 Low back pain: Secondary | ICD-10-CM | POA: Diagnosis not present

## 2020-08-13 DIAGNOSIS — M545 Low back pain: Secondary | ICD-10-CM | POA: Diagnosis not present

## 2020-08-17 DIAGNOSIS — M545 Low back pain: Secondary | ICD-10-CM | POA: Diagnosis not present

## 2020-08-24 ENCOUNTER — Encounter: Payer: Self-pay | Admitting: Family Medicine

## 2020-08-24 DIAGNOSIS — M545 Low back pain: Secondary | ICD-10-CM | POA: Diagnosis not present

## 2020-08-25 ENCOUNTER — Telehealth: Payer: Self-pay | Admitting: Family Medicine

## 2020-08-25 NOTE — Progress Notes (Signed)
  Chronic Care Management   Note  08/25/2020 Name: LARENZO CAPLES MRN: [id number] DOB: Jul 10, 1951  ASENCION LOVEDAY is a 69 y.o. year old male who is a primary care patient of Ardith Dark, MD. I reached out to Melene Plan by phone today in response to a referral sent by Mr. Gilman Olazabal Brull's PCP, Ardith Dark, MD.   Mr. Natter was given information about Chronic Care Management services today including:  1. CCM service includes personalized support from designated clinical staff supervised by his physician, including individualized plan of care and coordination with other care providers 2. 24/7 contact phone numbers for assistance for urgent and routine care needs. 3. Service will only be billed when office clinical staff spend 20 minutes or more in a month to coordinate care. 4. Only one practitioner may furnish and bill the service in a calendar month. 5. The patient may stop CCM services at any time (effective at the end of the month) by phone call to the office staff.   Patient agreed to services and verbal consent obtained.   Follow up plan:   Lynnae January Upstream Scheduler

## 2020-08-27 DIAGNOSIS — M545 Low back pain: Secondary | ICD-10-CM | POA: Diagnosis not present

## 2020-08-31 ENCOUNTER — Other Ambulatory Visit: Payer: Self-pay | Admitting: Family Medicine

## 2020-09-03 DIAGNOSIS — M545 Low back pain: Secondary | ICD-10-CM | POA: Diagnosis not present

## 2020-09-07 ENCOUNTER — Encounter: Payer: Self-pay | Admitting: Family Medicine

## 2020-09-07 ENCOUNTER — Telehealth (INDEPENDENT_AMBULATORY_CARE_PROVIDER_SITE_OTHER): Payer: PPO | Admitting: Family Medicine

## 2020-09-07 VITALS — Ht 66.0 in | Wt 174.0 lb

## 2020-09-07 DIAGNOSIS — Z794 Long term (current) use of insulin: Secondary | ICD-10-CM | POA: Diagnosis not present

## 2020-09-07 DIAGNOSIS — E1122 Type 2 diabetes mellitus with diabetic chronic kidney disease: Secondary | ICD-10-CM

## 2020-09-07 DIAGNOSIS — E1159 Type 2 diabetes mellitus with other circulatory complications: Secondary | ICD-10-CM | POA: Diagnosis not present

## 2020-09-07 DIAGNOSIS — M545 Low back pain: Secondary | ICD-10-CM | POA: Diagnosis not present

## 2020-09-07 DIAGNOSIS — N183 Chronic kidney disease, stage 3 unspecified: Secondary | ICD-10-CM | POA: Diagnosis not present

## 2020-09-07 DIAGNOSIS — I10 Essential (primary) hypertension: Secondary | ICD-10-CM

## 2020-09-07 DIAGNOSIS — Q383 Other congenital malformations of tongue: Secondary | ICD-10-CM

## 2020-09-07 DIAGNOSIS — R1900 Intra-abdominal and pelvic swelling, mass and lump, unspecified site: Secondary | ICD-10-CM

## 2020-09-07 DIAGNOSIS — I152 Hypertension secondary to endocrine disorders: Secondary | ICD-10-CM

## 2020-09-07 MED ORDER — NYSTATIN 100000 UNIT/ML MT SUSP: 5.0000 mL | Freq: Four times a day (QID) | OROMUCOSAL | 0 refills | Status: DC

## 2020-09-07 NOTE — Progress Notes (Signed)
   Jerry York is a 69 y.o. male who presents today for a virtual office visit.  Assessment/York:  New/Acute Problems: Tongue abnormality Concern for possible thrush still has no pain.  He is diabetic which does raise possibility of thrush.  Will start nystatin swish and swallow.  Abdominal Wall Bulge Difficult to evaluate via virtual visit.  He wants to see a surgeon to discuss surgical correction.  Will place referral today.  Chronic Problems Addressed Today: Type 2 diabetes mellitus (HCC) Managed by endocrinology.  On Humulin R 3 times daily with meals.  We will treat for possible thrush as above.  Hypertension associated with diabetes (HCC) Stable.  Continue amlodipine 5 mg daily and metoprolol tartrate 100 mg twice daily.     Subjective:  HPI:  Patient requests referral to see surgeon for ventral hernia.  He has been working with physical therapy and they are concerned that this is causing him to have difficulty with ambulation. Currently having pain.  Is also had a coating on his tongue.  No pain.  No treatments tried.       Objective/Observations  Physical Exam: Gen: NAD, resting comfortably HEENT: White film noted on tongue. Pulm: Normal work of breathing Neuro: Grossly normal, moves all extremities Psych: Normal affect and thought content  Virtual Visit via Video   I connected with Jerry York on 09/07/20 at  4:00 PM EDT by a video enabled telemedicine application and verified that I am speaking with the correct person using two identifiers. The limitations of evaluation and management by telemedicine and the availability of in person appointments were discussed. The patient expressed understanding and agreed to proceed.   Patient location: Home Provider location: [place] [employer] Persons participating in the virtual visit: Myself and Patient     Katina Degree. [name], MD 09/07/2020 4:25 PM

## 2020-09-07 NOTE — Assessment & Plan Note (Signed)
Managed by endocrinology.  On Humulin R 3 times daily with meals.  We will treat for possible thrush as above.

## 2020-09-07 NOTE — Assessment & Plan Note (Signed)
Stable.  Continue amlodipine 5 mg daily and metoprolol tartrate 100 mg twice daily.

## 2020-09-08 DIAGNOSIS — M47816 Spondylosis without myelopathy or radiculopathy, lumbar region: Secondary | ICD-10-CM | POA: Diagnosis not present

## 2020-09-10 DIAGNOSIS — M545 Low back pain: Secondary | ICD-10-CM | POA: Diagnosis not present

## 2020-09-11 ENCOUNTER — Other Ambulatory Visit: Payer: Self-pay | Admitting: Family Medicine

## 2020-09-13 ENCOUNTER — Other Ambulatory Visit: Payer: Self-pay | Admitting: Family Medicine

## 2020-09-14 NOTE — Telephone Encounter (Signed)
LAST APPOINTMENT DATE: 09/11/2020   NEXT APPOINTMENT DATE: 10/22/2020    LAST REFILL:09/06/2019  QTY:180   3 RF

## 2020-09-18 ENCOUNTER — Encounter: Payer: Self-pay | Admitting: Family Medicine

## 2020-09-30 ENCOUNTER — Other Ambulatory Visit: Payer: Self-pay | Admitting: Family Medicine

## 2020-10-13 ENCOUNTER — Other Ambulatory Visit: Payer: Self-pay | Admitting: Family Medicine

## 2020-10-14 DIAGNOSIS — R1900 Intra-abdominal and pelvic swelling, mass and lump, unspecified site: Secondary | ICD-10-CM | POA: Diagnosis not present

## 2020-10-19 ENCOUNTER — Other Ambulatory Visit: Payer: Self-pay | Admitting: Surgery

## 2020-10-22 ENCOUNTER — Other Ambulatory Visit: Payer: Self-pay

## 2020-10-22 ENCOUNTER — Ambulatory Visit (INDEPENDENT_AMBULATORY_CARE_PROVIDER_SITE_OTHER): Payer: PPO | Admitting: Family Medicine

## 2020-10-22 ENCOUNTER — Encounter: Payer: Self-pay | Admitting: Family Medicine

## 2020-10-22 VITALS — BP 139/67 | HR 57 | Temp 98.2°F | Ht 66.0 in | Wt 178.0 lb

## 2020-10-22 DIAGNOSIS — R600 Localized edema: Secondary | ICD-10-CM

## 2020-10-22 DIAGNOSIS — M1A09X0 Idiopathic chronic gout, multiple sites, without tophus (tophi): Secondary | ICD-10-CM

## 2020-10-22 DIAGNOSIS — N183 Chronic kidney disease, stage 3 unspecified: Secondary | ICD-10-CM

## 2020-10-22 DIAGNOSIS — Z23 Encounter for immunization: Secondary | ICD-10-CM | POA: Diagnosis not present

## 2020-10-22 DIAGNOSIS — E1169 Type 2 diabetes mellitus with other specified complication: Secondary | ICD-10-CM | POA: Diagnosis not present

## 2020-10-22 DIAGNOSIS — E1122 Type 2 diabetes mellitus with diabetic chronic kidney disease: Secondary | ICD-10-CM | POA: Diagnosis not present

## 2020-10-22 DIAGNOSIS — I152 Hypertension secondary to endocrine disorders: Secondary | ICD-10-CM

## 2020-10-22 DIAGNOSIS — Z794 Long term (current) use of insulin: Secondary | ICD-10-CM | POA: Diagnosis not present

## 2020-10-22 DIAGNOSIS — E785 Hyperlipidemia, unspecified: Secondary | ICD-10-CM | POA: Diagnosis not present

## 2020-10-22 DIAGNOSIS — E1159 Type 2 diabetes mellitus with other circulatory complications: Secondary | ICD-10-CM

## 2020-10-22 DIAGNOSIS — F419 Anxiety disorder, unspecified: Secondary | ICD-10-CM | POA: Diagnosis not present

## 2020-10-22 LAB — POCT GLYCOSYLATED HEMOGLOBIN (HGB A1C): Hemoglobin A1C: 6.9 % — AB (ref 4.0–5.6)

## 2020-10-22 MED ORDER — TORSEMIDE 20 MG PO TABS: 40.0000 mg | ORAL_TABLET | Freq: Two times a day (BID) | ORAL | 3 refills | Status: DC

## 2020-10-22 MED ORDER — TRAZODONE HCL 100 MG PO TABS
ORAL_TABLET | ORAL | 3 refills | Status: DC
Start: 2020-10-22 — End: 2020-12-09

## 2020-10-22 MED ORDER — AMLODIPINE BESYLATE 5 MG PO TABS
5.0000 mg | ORAL_TABLET | Freq: Every day | ORAL | 3 refills | Status: DC
Start: 2020-10-22 — End: 2021-01-09

## 2020-10-22 MED ORDER — FLUCONAZOLE 100 MG PO TABS: ORAL_TABLET | ORAL | 0 refills | Status: DC

## 2020-10-22 MED ORDER — ATORVASTATIN CALCIUM 40 MG PO TABS
40.0000 mg | ORAL_TABLET | Freq: Every day | ORAL | 3 refills | Status: DC
Start: 2020-10-22 — End: 2021-01-09

## 2020-10-22 MED ORDER — METOPROLOL TARTRATE 100 MG PO TABS
100.0000 mg | ORAL_TABLET | Freq: Two times a day (BID) | ORAL | 3 refills | Status: DC
Start: 2020-10-22 — End: 2021-01-09

## 2020-10-22 MED ORDER — ALLOPURINOL 100 MG PO TABS
300.0000 mg | ORAL_TABLET | Freq: Every day | ORAL | 3 refills | Status: DC
Start: 2020-10-22 — End: 2021-05-05

## 2020-10-22 NOTE — Assessment & Plan Note (Signed)
At goal.  Continue amlodipine 5 mg daily and metoprolol tartrate 100 mg twice daily.

## 2020-10-22 NOTE — Patient Instructions (Signed)
It was very nice to see you today!  Please try the fluconazole for your time.  Let me know if not improving.  We will switch from furosemide to torsemide.  No other changes today.  I will see you back in 6 months.  Please come back to see me sooner if needed.  Take care, Dr [name]  Please try these tips to maintain a healthy lifestyle:   Eat at least 3 REAL meals and 1-2 snacks per day.  Aim for no more than 5 hours between eating.  If you eat breakfast, please do so within one hour of getting up.    Each meal should contain half fruits/vegetables, one quarter protein, and one quarter carbs (no bigger than a computer mouse)   Cut down on sweet beverages. This includes juice, soda, and sweet tea.     Drink at least 1 glass of water with each meal and aim for at least 8 glasses per day   Exercise at least 150 minutes every week.

## 2020-10-22 NOTE — Assessment & Plan Note (Signed)
Stable.  Continue trazodone 200 mg daily and Klonopin 0.5 mg twice daily as needed. 

## 2020-10-22 NOTE — Assessment & Plan Note (Addendum)
Continue Lipitor 40 mg daily.  Last LDL at goal though has low HDL and high triglycerides.

## 2020-10-22 NOTE — Assessment & Plan Note (Signed)
A1c 6.9.  Continue insulin per endocrinology.

## 2020-10-22 NOTE — Assessment & Plan Note (Signed)
We will switch to torsemide 40 mg twice daily.  Currently on Lasix 80 mg daily.  Recent be met within normal limits.

## 2020-10-22 NOTE — Progress Notes (Signed)
Cloy W Melichar is a 69 y.o. male who presents today for an office visit.  Assessment/Plan:  New/Acute Problems: White Tongue Treated couple months ago with nystatin.  No improvement.  Will try oral fluconazole.  May need referral to ENT if no improvement.  Chronic Problems Addressed Today: Lower extremity edema We will switch to torsemide 40 mg twice daily.  Currently on Lasix 80 mg daily.  Recent be met within normal limits.  Anxiety with insomnia Stable.  Continue trazodone 200 mg daily and Klonopin 0.5 mg twice daily as needed.  Gout Stable.  No recent flares.  Will refill allopurinol.  Type 2 diabetes mellitus (HCC) A1c 6.9.  Continue insulin per endocrinology.  Hypertension associated with diabetes (HCC) At goal.  Continue amlodipine 5 mg daily and metoprolol tartrate 100 mg twice daily.  Hyperlipidemia associated with type 2 diabetes mellitus (HCC) Continue Lipitor 40 mg daily.  Last LDL at goal though has low HDL and high triglycerides.     Subjective:  HPI:  See A/p.         Objective:  Physical Exam: BP 139/67   Pulse (!) 57   Temp 98.2 F (36.8 C) (Temporal)   Ht 5' 6" (1.676 m)   Wt 178 lb (80.7 kg)   SpO2 96%   BMI 28.73 kg/m   Gen: No acute distress, resting comfortably HEENT: Tongue with thick, hairy, white plaques. CV: Regular rate and rhythm with no murmurs appreciated Pulm: Normal work of breathing, clear to auscultation bilaterally with no crackles, wheezes, or rhonchi Neuro: Grossly normal, moves all extremities Psych: Normal affect and thought content      Caleb M. Parker, MD 10/22/2020 10:39 AM  

## 2020-10-22 NOTE — Assessment & Plan Note (Signed)
Stable.  No recent flares.  Will refill allopurinol.

## 2020-10-23 ENCOUNTER — Encounter: Payer: Self-pay | Admitting: Family Medicine

## 2020-10-23 ENCOUNTER — Other Ambulatory Visit: Payer: Self-pay | Admitting: Surgery

## 2020-10-23 DIAGNOSIS — R1900 Intra-abdominal and pelvic swelling, mass and lump, unspecified site: Secondary | ICD-10-CM

## 2020-10-28 ENCOUNTER — Other Ambulatory Visit: Payer: Self-pay | Admitting: Family Medicine

## 2020-10-29 ENCOUNTER — Telehealth: Payer: Self-pay | Admitting: Rheumatology

## 2020-10-29 NOTE — Telephone Encounter (Signed)
Patient was apart of the patient assistance program for this year. That has expired, per patient. He has not received paperwork to fill out to renew. Please advise.

## 2020-10-29 NOTE — Telephone Encounter (Signed)
Called patient and he provided information to apply for a new [employer] Gout Grant. Patient was approved. Coverage dates are 10/27/20 through 10/26/21.   Pharmacy Card [id number] [phone] [id number]  Called and provided information to pharmacy. Nothing further needed.

## 2020-11-06 ENCOUNTER — Other Ambulatory Visit: Payer: Self-pay | Admitting: Surgery

## 2020-11-06 ENCOUNTER — Ambulatory Visit
Admission: RE | Admit: 2020-11-06 | Discharge: 2020-11-06 | Disposition: A | Discharge status: Home / Self Care | Payer: PPO | Source: Ambulatory Visit | Attending: Surgery | Admitting: Surgery

## 2020-11-06 DIAGNOSIS — K8020 Calculus of gallbladder without cholecystitis without obstruction: Secondary | ICD-10-CM | POA: Diagnosis not present

## 2020-11-06 DIAGNOSIS — R1900 Intra-abdominal and pelvic swelling, mass and lump, unspecified site: Secondary | ICD-10-CM

## 2020-11-23 ENCOUNTER — Ambulatory Visit: Payer: PPO

## 2020-11-23 ENCOUNTER — Other Ambulatory Visit: Payer: Self-pay

## 2020-11-23 DIAGNOSIS — E1122 Type 2 diabetes mellitus with diabetic chronic kidney disease: Secondary | ICD-10-CM

## 2020-11-23 DIAGNOSIS — E1159 Type 2 diabetes mellitus with other circulatory complications: Secondary | ICD-10-CM

## 2020-11-23 DIAGNOSIS — Z794 Long term (current) use of insulin: Secondary | ICD-10-CM

## 2020-11-23 DIAGNOSIS — I152 Hypertension secondary to endocrine disorders: Secondary | ICD-10-CM

## 2020-11-23 DIAGNOSIS — E1169 Type 2 diabetes mellitus with other specified complication: Secondary | ICD-10-CM

## 2020-11-23 DIAGNOSIS — E785 Hyperlipidemia, unspecified: Secondary | ICD-10-CM

## 2020-11-23 NOTE — Patient Instructions (Addendum)
Jerry York,   Thank you for taking the time to review your medications with me today. I have left a message with Dr. Buddy Duty to see if they would consider witting prescription for [name] 2 continuous glucose monitor - this could help as it gives realtime alerts if blood sugar is running low or high.  Please review care plan below and call me at [phone] with any questions.  Thanks! Jerry York, Pharm.D., BCGP Clinical Pharmacist [facility] at [facility] [phone]  Goals Addressed              This Visit's Progress   .  Patient Stated (pt-stated)        CARE PLAN ENTRY (see longitudinal plan of care for additional care plan information)  Current Barriers:  . Chronic Disease Management support, education, and care coordination needs related to Hypertension, Hyperlipidemia, and Diabetes   Hypertension  BP Readings from Last 3 Encounters:  10/22/20 139/67  08/04/20 (!) 143/75  06/26/20 135/73   . Pharmacist Clinical Goal(s): o Over the next 365 days, patient will work with PharmD and providers to maintain BP goal <140/90 . Current regimen:  . Metoprolol tartrate 100 mg twice daily . Amlodipine 5 mg once daily . Interventions: o We discussed diet and exercise extensively. Maintain a healthy weight and exercise regularly, as directed by your health care provider. Eat healthy foods, such as: Lean proteins, complex carbohydrates, fresh fruits and vegetables, low-fat dairy products, healthy fats. o Reviewed recommendations for home BP monitoring. . Patient self care activities - Over the next 365 days, patient will: o Check BP at last once every 1-2 weeks, document, and provide at future appointments o Ensure daily salt intake < 2300 mg/day  Hyperlipidemia Lab Results  Component Value Date/Time   LDLCALC Comment (A) 08/29/2019 11:05 AM   LDLDIRECT 37.0 04/22/2020 09:42 AM   . Pharmacist Clinical Goal(s): o Over the next 365 days,  patient will work with PharmD and providers to maintain LDL goal < 70 . Current regimen:  o Atorvastatin 40 mg once daily . Interventions: o We discussed diet and exercise extensively. Maintain a healthy weight and exercise regularly, as directed by your health care provider. Eat healthy foods, such as: Lean proteins, complex carbohydrates, fresh fruits and vegetables, low-fat dairy products, healthy fats. . Patient self care activities - Over the next 365 days, patient will: o Continue to limit sugary foods  Diabetes Lab Results  Component Value Date/Time   HGBA1C 6.9 (A) 10/22/2020 10:10 AM   HGBA1C 6.8 (H) 04/22/2020 09:42 AM   HGBA1C 6.9 11/14/2019 12:00 AM   HGBA1C 8.1 08/09/2019 12:00 AM   . Pharmacist Clinical Goal(s): o Over the next 365 days, patient will work with PharmD and providers to achieve A1c goal <7% . Current regimen:  o Humulin R-U 500 20-30 units twice a day with lunch and supper  . Interventions: o Reviewed patient assistance program - already on [employer] PAP for Humulin. o Reviewed types of continuous glucose monitors (CGM). [name] 2 could be considered - reached out to Dr. Buddy Duty to see if they would consider. o Reviewed rule of 15 for blood sugar correction. . Patient self care activities - Over the next 365 days, patient will: o Check blood sugar 3-4 times daily, document, and provide at future appointments o Contact provider with any episodes of hypoglycemia  Medication management . Pharmacist Clinical Goal(s): o Over the next 365 days, patient will work with  PharmD and providers to maintain optimal medication adherence . Current pharmacy: [facility] . Interventions o Comprehensive medication review performed. o Continue current medication management strategy . Patient self care activities - Over the next 365 days, patient will: o Take medications as prescribed o Report any questions or concerns to PharmD and/or provider(s) Initial goal documentation.       The patient verbalized understanding of instructions provided today and agreed to receive a mailed copy of patient instruction and/or educational materials. Telephone follow up appointment with pharmacy team member scheduled for: See next appointment with "Care Management Staff" under "What's Next" below.   Madelin Rear, Pharm.D., BCGP Clinical Pharmacist [facility] at [facility] [phone]  Hypoglycemia Hypoglycemia is when the sugar (glucose) level in your blood is too low. Signs of low blood sugar may include:  Feeling: ? Hungry. ? Worried or nervous (anxious). ? Sweaty and clammy. ? Confused. ? Dizzy. ? Sleepy. ? Sick to your stomach (nauseous).  Having: ? A fast heartbeat. ? A headache. ? A change in your vision. ? Tingling or no feeling (numbness) around your mouth, lips, or tongue. ? Jerky movements that you cannot control (seizure).  Having trouble with: ? Moving (coordination). ? Sleeping. ? Passing out (fainting). ? Getting upset easily (irritability). Low blood sugar can happen to people who have diabetes and people who do not have diabetes. Low blood sugar can happen quickly, and it can be an emergency. Treating low blood sugar Low blood sugar is often treated by eating or drinking something sugary right away, such as:  Fruit juice, 4-6 oz (120-150 mL).  Regular soda (not diet soda), 4-6 oz (120-150 mL).  Low-fat milk, 4 oz (120 mL).  Several pieces of hard candy.  Sugar or honey, 1 Tbsp (15 mL). Treating low blood sugar if you have diabetes If you can think clearly and swallow safely, follow the 15:15 rule:  Take 15 grams of a fast-acting carb (carbohydrate). Talk with your doctor about how much you should take.  Always keep a source of fast-acting carb with you, such as: ? Sugar tablets (glucose pills). Take 3-4 pills. ? 6-8 pieces of hard candy. ? 4-6 oz (120-150 mL) of fruit juice. ? 4-6 oz (120-150 mL) of regular (not diet)  soda. ? 1 Tbsp (15 mL) honey or sugar.  Check your blood sugar 15 minutes after you take the carb.  If your blood sugar is still at or below 70 mg/dL (3.9 mmol/L), take 15 grams of a carb again.  If your blood sugar does not go above 70 mg/dL (3.9 mmol/L) after 3 tries, get help right away.  After your blood sugar goes back to normal, eat a meal or a snack within 1 hour.  Treating very low blood sugar If your blood sugar is at or below 54 mg/dL (3 mmol/L), you have very low blood sugar (severe hypoglycemia). This may also cause:  Passing out.  Jerky movements you cannot control (seizure).  Losing consciousness (coma). This is an emergency. Do not wait to see if the symptoms will go away. Get medical help right away. Call your local emergency services (911 in the [place] Do not drive yourself to the hospital. If you have very low blood sugar and you cannot eat or drink, you may need a glucagon shot (injection). A family member or friend should learn how to check your blood sugar and how to give you a glucagon shot. Ask your doctor if you need to have  a glucagon shot kit at home. Follow these instructions at home: General instructions  Take over-the-counter and prescription medicines only as told by your doctor.  Stay aware of your blood sugar as told by your doctor.  Limit alcohol intake to no more than 1 drink a day for nonpregnant women and 2 drinks a day for men. One drink equals 12 oz of beer (355 mL), 5 oz of wine (148 mL), or 1 oz of hard liquor (44 mL).  Keep all follow-up visits as told by your doctor. This is important. If you have diabetes:   Follow your diabetes care plan as told by your doctor. Make sure you: ? Know the signs of low blood sugar. ? Take your medicines as told. ? Follow your exercise and meal plan. ? Eat on time. Do not skip meals. ? Check your blood sugar as often as told by your doctor. Always check it before and after exercise. ? Follow your sick  day plan when you cannot eat or drink normally. Make this plan ahead of time with your doctor.  Share your diabetes care plan with: ? Your work or school. ? People you live with.  Check your pee (urine) for ketones: ? When you are sick. ? As told by your doctor.  Carry a card or wear jewelry that says you have diabetes. Contact a doctor if:  You have trouble keeping your blood sugar in your target range.  You have low blood sugar often. Get help right away if:  You still have symptoms after you eat or drink something sugary.  Your blood sugar is at or below 54 mg/dL (3 mmol/L).  You have jerky movements that you cannot control.  You pass out. These symptoms may be an emergency. Do not wait to see if the symptoms will go away. Get medical help right away. Call your local emergency services (911 in the [place] Do not drive yourself to the hospital. Summary  Hypoglycemia happens when the level of sugar (glucose) in your blood is too low.  Low blood sugar can happen to people who have diabetes and people who do not have diabetes. Low blood sugar can happen quickly, and it can be an emergency.  Make sure you know the signs of low blood sugar and know how to treat it.  Always keep a source of sugar (fast-acting carb) with you to treat low blood sugar. This information is not intended to replace advice given to you by your health care provider. Make sure you discuss any questions you have with your health care provider. Document Revised: 04/04/2019 Document Reviewed: 01/15/2016 Elsevier Patient Education  2020 [employer].

## 2020-11-23 NOTE — Progress Notes (Signed)
Chronic Care Management Pharmacy  Name: LARUE LIGHTNER  MRN: [id number]   DOB: 07-22-1951  Chief Complaint/ HPI Jerry York, 69 y.o., male, presents for their initial CCM visit with the clinical pharmacist via telephone due to COVID-19 pandemic .  PCP : [name], MD Encounter Diagnoses  Name Primary?   Hyperlipidemia associated with type 2 diabetes mellitus ([place]) Yes   Type 2 diabetes mellitus with stage 3 chronic kidney disease, with long-term current use of insulin, unspecified whether stage 3a or 3b CKD ([place])    Hypertension associated with diabetes ([place])     Office Visits:  10/22/2020 (PCP): Torsemide 20 mg - two tablets twice daily (switched to torsemide from lasix 80 mg 10/22/2020). White tongue treated 2 months ago with nystatin, fluconazole trial this visit, will refer to ent if no improvement.  Consult Visit: 08/04/2020 (Dr Estanislado Pandy): Denies any recent gout flares, some discomfort in right knee joint, current PT. 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   Type 2 diabetes mellitus ([place]) 08/09/2017   Gout 08/09/2017   Anxiety with insomnia 08/09/2017   Hyperlipidemia associated with type 2 diabetes mellitus ([place]) 08/08/2017   Hypertension associated with diabetes ([place]) 08/08/2017   Past Surgical History:  Procedure Laterality Date   APPENDECTOMY  age 36   CYSTOSCOPY WITH URETEROSCOPY AND STENT PLACEMENT Left 05/08/2017   Procedure: URETEROSCOPY AND STENT PLACEMENT;  Surgeon: Kathie Rhodes, MD;  Location: [facility];  Service: Urology;  Laterality: Left;   CYSTOSCOPY/RETROGRADE/URETEROSCOPY Bilateral 05/08/2017   Procedure: CYSTOSCOPY/  BILATERAL RETROGRADE;  Surgeon: Kathie Rhodes, MD;  Location: [facility];  Service: Urology;  Laterality: Bilateral;   HEMATOMA EVACUATION N/A 09/20/2019   Procedure: EVACUATION LUMBAR EPIDURAL HEMATOMA;  Surgeon: Newman Pies, MD;  Location: [place];  Service: Neurosurgery;  Laterality: N/A;  EVACUATION LUMBAR EPIDURAL HEMATOMA   INGUINAL HERNIA REPAIR Right 10/10/2000   KNEE ARTHROSCOPY Right 1980's   LUMBAR LAMINECTOMY/DECOMPRESSION MICRODISCECTOMY N/A 09/16/2019   Procedure: Laminectomy and Foraminotomy - Lumbar Two-Lumbar Three - Lumbar Three-Lumbar Four - Lumbar Four-Lumbar Five - Lumbar Five-Sacral One;  Surgeon: Eustace Moore, MD;  Location: [facility] OR;  Service: Neurosurgery;  Laterality: N/A;  Laminectomy and Foraminotomy - Lumbar Two-Lumbar Three - Lumbar Three-Lumbar Four - Lumbar Four-Lumbar Five - Lumbar Five-Sacral One   NASAL FRACTURE SURGERY     in highschool    SHOULDER ARTHROSCOPY WITH OPEN ROTATOR CUFF REPAIR Right 1990's   TRANSURETHRAL RESECTION OF PROSTATE     Family History  Problem Relation Age of Onset   Congestive Heart Failure Mother        Related to valve disease.  Opted to treat medically   Hypertension Mother    Hyperlipidemia Mother    Diabetes Mother    Hypertension Father    Hyperlipidemia Father    Coronary artery disease Father 14       History of CABG   Stroke Father    Hypertension Brother    Diabetes Brother    Hypertension Daughter    Hypertension Son    Hypertension Brother    Hypertension Brother    Allergies  Allergen Reactions   Prednisone Other (See Comments)    Elevated  blood sugar   Sulfa Antibiotics Other (See Comments)    "make my feet burn"   Azithromycin Nausea And Vomiting   Hydrocodone-Acetaminophen Itching   Metformin And Related Diarrhea   Outpatient Encounter Medications as of 11/23/2020  Medication Sig Note   allopurinol (ZYLOPRIM) 100 MG tablet Take 3 tablets (300 mg total) by  mouth daily.    amLODipine (NORVASC) 5 MG tablet Take 1 tablet (5 mg total) by mouth daily.    atorvastatin (LIPITOR) 40 MG tablet Take 1 tablet (40 mg total) by mouth daily.    [name] Take 500 mg by mouth 2 (two) times daily.     clonazePAM (KLONOPIN) 0.5 MG tablet Take 1 tablet by mouth twice daily    colchicine 0.6 MG tablet TAKE 1 TABLET BY MOUTH DAILY. TAKE 1 TABLET BY MOUTH 2 TIMES A DAY IF EXPERIENCING A FLARE    metoprolol tartrate (LOPRESSOR) 100 MG tablet Take 1 tablet (100 mg total) by mouth 2 (two) times daily.    torsemide (DEMADEX) 20 MG tablet Take 2 tablets (40 mg total) by mouth 2 (two) times daily.    traZODone (DESYREL) 100 MG tablet TAKE 2 TABLETS BY MOUTH EVERY DAY AT BEDTIME AS NEEDED FOR  SLEEP    Continuous Blood Gluc Sensor ([name] 14 DAY SENSOR) MISC USE AS DIRECTED    Continuous Blood Gluc Sensor ([name] 14 DAY SENSOR) MISC APPLY 1 KIT TOPICALLY EVERY 14 (FOURTEEN) DAYS    glucose monitoring kit ([name]) monitoring kit 1 each by Does not apply route as needed for other. Check blood sugar at home up to 3 times daily    HUMULIN R U-500 KWIKPEN 500 UNIT/ML kwikpen Inject 60-80 Units as directed See admin instructions. INJECT 80 UNITS BEFORE BREAKFAST 60 UNITS BEFORE LUNCH AND 60 UNITS BEFORE EVENING MEAL. [place] 09/20/2019: Pt states he will not be taking this unless [employer] agrees to pay for it. Dr. Buddy Duty submitted request 1-2 weeks ago   [DISCONTINUED] fluconazole (DIFLUCAN) 100 MG tablet Take 2 pills on day 1 then one pill a day for 6 days.    [DISCONTINUED] nystatin (MYCOSTATIN) 100000 UNIT/ML suspension Take 5 mLs (500,000 Units total) by mouth 4 (four) times daily.    [DISCONTINUED] tiZANidine (ZANAFLEX) 2 MG tablet Take 2 mg by mouth at bedtime as needed.    No facility-administered encounter medications on file as of 11/23/2020.   Patient Care Team    Relationship Specialty Notifications Start End  [name], MD PCP - General Family Medicine Admissions 08/11/17   Kerry Fort., MD  Urology  11/10/17   Delrae Rend, MD Consulting Physician Endocrinology  11/10/17   Gardiner Barefoot, [facility] Consulting Physician Podiatry  03/31/20   Madelin Rear, [facility] Pharmacist Pharmacist  08/25/20    Comment: [phone]   Current Diagnosis/Assessment: Goals Addressed              This Visit's Progress     Patient Stated (pt-stated)        CARE PLAN ENTRY (see longitudinal plan of care for additional care plan information)  Current Barriers:   Chronic Disease Management support, education, and care coordination needs related to Hypertension, Hyperlipidemia, and Diabetes   Hypertension  BP Readings from Last 3 Encounters:  10/22/20 139/67  08/04/20 (!) 143/75  06/26/20 135/73    Pharmacist Clinical Goal(s): o Over the next 365 days, patient will work with PharmD and providers to maintain BP goal <140/90  Current  regimen:   Metoprolol tartrate 100 mg twice daily  Amlodipine 5 mg once daily  Interventions: o We discussed diet and exercise extensively. Maintain a healthy weight and exercise regularly, as directed by your health care provider. Eat healthy foods, such as: Lean proteins, complex carbohydrates, fresh fruits and vegetables, low-fat dairy products, healthy fats. o Reviewed recommendations for home BP monitoring.  Patient self care activities - Over the next 365 days, patient will: o Check BP at last once every 1-2 weeks, document, and provide at future appointments o Ensure daily salt intake < 2300 mg/day  Hyperlipidemia Lab Results  Component Value Date/Time   LDLCALC Comment (A) 08/29/2019 11:05 AM   LDLDIRECT 37.0 04/22/2020 09:42 AM    Pharmacist Clinical Goal(s): o Over the next 365 days, patient will work with PharmD and providers to maintain LDL goal < 70  Current regimen:  o Atorvastatin 40 mg once daily  Interventions: o We discussed diet and exercise  extensively. Maintain a healthy weight and exercise regularly, as directed by your health care provider. Eat healthy foods, such as: Lean proteins, complex carbohydrates, fresh fruits and vegetables, low-fat dairy products, healthy fats.  Patient self care activities - Over the next 365 days, patient will: o Continue to limit sugary foods  Diabetes Lab Results  Component Value Date/Time   HGBA1C 6.9 (A) 10/22/2020 10:10 AM   HGBA1C 6.8 (H) 04/22/2020 09:42 AM   HGBA1C 6.9 11/14/2019 12:00 AM   HGBA1C 8.1 08/09/2019 12:00 AM    Pharmacist Clinical Goal(s): o Over the next 365 days, patient will work with PharmD and providers to achieve A1c goal <7%  Current regimen:  o Humulin R-U 500 20-30 units twice a day with lunch and supper   Interventions: o Reviewed patient assistance program - already on [employer] PAP for Humulin. o Reviewed types of continuous glucose monitors (CGM). [name] 2 could be considered - reached out to Dr. Buddy Duty to see if they would consider. o Reviewed rule of 15 for blood sugar correction.  Patient self care activities - Over the next 365 days, patient will: o Check blood sugar 3-4 times daily, document, and provide at future appointments o Contact provider with any episodes of hypoglycemia  Medication management  Pharmacist Clinical Goal(s): o Over the next 365 days, patient will work with PharmD and providers to maintain optimal medication adherence  [facility]  Interventions o Comprehensive medication review performed. o Continue current medication management strategy  Patient self care activities - Over the next 365 days, patient will: o Take medications as prescribed o Report any questions or concerns to PharmD and/or provider(s) Initial goal documentation.      Hypertension / lower extremity edema   BP goal <140/90  BP Readings from Last 3 Encounters:  10/22/20 139/67  08/04/20 (!) 143/75  06/26/20 135/73   BMP Latest Ref Rng  & Units 08/04/2020 04/22/2020 02/04/2020  Glucose 65 - 99 mg/dL 198(H) 159(H) 214(H)  BUN 7 - 25 mg/dL 20 30(H) 30(H)  Creatinine 0.70 - 1.25 mg/dL 1.25 1.55(H) 1.46(H)  BUN/Creat Ratio 6 - 22 (calc) NOT APPLICABLE - 21  Sodium 135 - 146 mmol/L 143 143 142  Potassium 3.5 - 5.3 mmol/L 3.4(L) 3.9 3.8  Chloride 98 - 110 mmol/L 103 103 101  CO2 20 - 32 mmol/L 30 32 32  Calcium 8.6 - 10.3 mg/dL 8.6 8.7 8.7   Patient checks BP at home infrequently. Recent home readings: 120/70s recent. Tries to limit carbs. Bananas for snack.  Exercise limited due to back pain and not being able to stand. Has completed PT.  Patient is currently at goal on the following medications:   Metoprolol tartrate 100 mg twice daily  Amlodipine 5 mg once daily  Torsemide 20 mg - two tablets twice daily (switched to torsemide from lasix 80 mg 10/22/2020)  We discussed diet and exercise extensively. Maintain a healthy weight and exercise regularly, as directed by your health care provider. Eat healthy foods, such as: Lean proteins, complex carbohydrates, fresh fruits and vegetables, low-fat dairy products, healthy fats.  Reviewed recommendations for home BP monitoring.  Plan  Continue current medications.  Diabetes   A1c goal < 7%  Lab Results  Component Value Date/Time   HGBA1C 6.9 (A) 10/22/2020 10:10 AM   HGBA1C 6.8 (H) 04/22/2020 09:42 AM   HGBA1C 6.9 11/14/2019 12:00 AM   HGBA1C 8.1 08/09/2019 12:00 AM   MICROALBUR 4.9 (H) 04/22/2020 09:43 AM   MICROALBUR 85.3 (H) 09/27/2017 01:06 PM    Reports avg BG 145. Unpredictable lows per patient, corrects with saltine crackers. Checking BG: Daily through CGM scan (freestyle 14 day) Taking cinnamon in am and at night - 4000 mg twice daily. Patient is currently at goal on the following medications:   Humulin R-U 500 20-30 units twice a day with lunch and supper   We discussed: diet and exercise extensively.  Reviewed patient assistance program - already on [employer] PAP  for Humulin. Reviewed types of continuous glucose monitors (CGM). [name] 2 could be considered - reached out to Dr. Buddy Duty and left message to see if they would consider. Reviewed rule of 15 for blood sugar correction - provided handout.  Plan  Continue current medications.  Hyperlipidemia   LDL goal < 70  Lipid Panel     Component Value Date/Time   CHOL 153 04/22/2020 0942   CHOL 236 (H) 08/29/2019 1105   TRIG (H) 04/22/2020 0942    456.0 Triglyceride is over 400; calculations on Lipids are invalid.   HDL 22.10 (L) 04/22/2020 0942   HDL 18 (L) 08/29/2019 1105   LDLCALC Comment (A) 08/29/2019 1105   LDLDIRECT 37.0 04/22/2020 0942    Hepatic Function Latest Ref Rng & Units 08/04/2020 04/22/2020 02/04/2020  Total Protein 6.1 - 8.1 g/dL 6.2 6.1 6.1  Albumin 3.5 - 5.2 g/dL - 3.9 -  AST 10 - 35 U/L 31 30 32  ALT 9 - 46 U/L 37 41 39  Alk Phosphatase 39 - 117 U/L - 76 -  Total Bilirubin 0.2 - 1.2 mg/dL 0.6 0.7 0.5  Bilirubin, Direct 0.0 - 0.3 mg/dL - - -    The 10-year ASCVD risk score Mikey Bussing DC Jr., et al., 2013) is: 44.5%   Values used to calculate the score:     Age: 14 years     Sex: Male     Is Non-Hispanic African American: No     Diabetic: Yes     Tobacco smoker: No     Systolic Blood Pressure: 937 mmHg     Is BP treated: Yes     HDL Cholesterol: 22.1 mg/dL     Total Cholesterol: 153 mg/dL   Patient is currently at goal the following medications:   Atorvastatin 40 mg once daily  We discussed diet and exercise extensively.   Plan  Continue current medications  Anxiety with insomnia   Reports sleep throughout the night, feels well rested throughout day.  Denies side effects, reports ongoing benefit of taking medication. Patient  is currently controlled on the following medications:   Trazodone 200 mg once daily  Clonazepam 0.5 mg twice daily as needed - takes at night as two pills (1 mg)  Plan  Continue current medications.  Gout    Lab Results    Component Value Date   LABURIC 5.5 08/04/2020   LABURIC 6.1 02/04/2020   LABURIC 5.9 08/06/2019   Denies recent issues with gout. Patient is currently controlled on:   Allopurinol 300 mg once daily   Colchicine 0.6 mg once daily, 1.2 mg/day in event of gout flare  Counseled on avoidance of alcohol and low purine diet.  Reviewed patient assistance - On grant to help with allopurinol and colchicine costs.  Plan  Continue current medications  Vaccines   Immunization History  Administered Date(s) Administered   Fluad Quad(high Dose 65+) 10/07/2019, 10/22/2020   Influenza, High Dose Seasonal PF 10/12/2018   Influenza,inj,Quad PF,6+ Mos 09/26/2017   Influenza-Unspecified 09/26/2017   PFIZER SARS-COV-2 Vaccination 01/20/2020, 02/10/2020, 10/05/2020   Pneumococcal Conjugate-13 11/10/2017   Pneumococcal Polysaccharide-23 12/30/2013   Tdap 11/02/2018   Reviewed and discussed patient's vaccination history.  Some side effect concern with Shingrix.   Plan  Recommended patient receive Shingrix vaccine.   Medication Management / Care Coordination   Receives prescription medications from:  [address], [place] [address] Phone: [phone] Fax: [phone]  [id number] [address] [id number]: [phone]: [phone]  Denies any issues with current medication management.   Plan  Continue current medication management strategy. ___________________________ SDOH (Social Determinants of Health) assessments performed: Yes.  Future Appointments  Date Time Provider [place] 02/04/2021  1:30 PM DBo Merino MD [facility] None  04/23/2021  1:20 PM PVivi Barrack MD [facility]   Visit follow-up:   CPA follow-up: 2 month BP call - pt had not  routinely been taking at home, had previously been encouraged by PCP and [facility] - can use upper arm BP cuff and test at least once every 1-2 weeks. Ask if any updates with diet, including salt intake? Schedule RPH visit in 6 months from call.  RPH follow-up: 8 month telephone visit.  JMadelin Rear Pharm.D., BCGP Clinical Pharmacist [facility] ([phone]

## 2020-12-09 ENCOUNTER — Other Ambulatory Visit: Payer: Self-pay | Admitting: Family Medicine

## 2021-01-09 ENCOUNTER — Other Ambulatory Visit: Payer: Self-pay | Admitting: Family Medicine

## 2021-01-11 ENCOUNTER — Telehealth: Payer: Self-pay

## 2021-01-11 NOTE — Progress Notes (Signed)
Chronic Care Management Pharmacy Assistant   Name: Jerry York  MRN: [id number] DOB: [birth year]  Reason for Encounter: Disease State   PCP : Jerry Barrack, MD  Allergies:   Allergies  Allergen Reactions  . Prednisone Other (See Comments)    Elevated blood sugar  . Sulfa Antibiotics Other (See Comments)    "make my feet burn"  . Azithromycin Nausea And Vomiting  . Hydrocodone-Acetaminophen Itching  . Metformin And Related Diarrhea    Medications: Outpatient Encounter Medications as of 01/11/2021  Medication Sig Note  . clonazePAM (KLONOPIN) 0.5 MG tablet Take 1 tablet by mouth twice daily   . traZODone (DESYREL) 100 MG tablet TAKE 2 TABLETS BY MOUTH EVERY DAY AT BEDTIME AS NEEDED FOR SLEEP   . allopurinol (ZYLOPRIM) 100 MG tablet Take 3 tablets (300 mg total) by mouth daily.   [name] amLODipine (NORVASC) 5 MG tablet Take 1 tablet by mouth once daily   . atorvastatin (LIPITOR) 40 MG tablet Take 1 tablet by mouth once daily   . [name] Take 500 mg by mouth 2 (two) times daily.    . colchicine 0.6 MG tablet TAKE 1 TABLET BY MOUTH DAILY. TAKE 1 TABLET BY MOUTH 2 TIMES A DAY IF EXPERIENCING A FLARE   . Continuous Blood Gluc Sensor ([name] 14 DAY SENSOR) MISC USE AS DIRECTED   . Continuous Blood Gluc Sensor ([name] 14 DAY SENSOR) MISC APPLY 1 KIT TOPICALLY EVERY 14 (FOURTEEN) DAYS   . glucose monitoring kit ([name]) monitoring kit 1 each by Does not apply route as needed for other. Check blood sugar at home up to 3 times daily   . HUMULIN R U-500 KWIKPEN 500 UNIT/ML kwikpen Inject 60-80 Units as directed See admin instructions. INJECT 80 UNITS BEFORE BREAKFAST 60 UNITS BEFORE LUNCH AND 60 UNITS BEFORE EVENING MEAL. Jerry York 09/20/2019: Pt states he will not be taking this unless Jerry York agrees to pay for it. Dr. Buddy York submitted request 1-2 weeks ago  . metoprolol tartrate (LOPRESSOR) 100 MG tablet Take 1 tablet by mouth twice daily   . torsemide  (DEMADEX) 20 MG tablet Take 2 tablets (40 mg total) by mouth 2 (two) times daily.    No facility-administered encounter medications on file as of 01/11/2021.    Current Diagnosis: Patient Active Problem List   Diagnosis Date Noted  . Pain due to onychomycosis of toenails of both feet 01/21/2020  . Neurogenic bladder 09/25/2019  . S/P lumbar laminectomy 09/16/2019  . Lower extremity edema 12/11/2017  . Family history of premature CAD 11/27/2017  . 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 Type 2 diabetes mellitus ([place]) 08/09/2017  . Gout 08/09/2017  . Anxiety with insomnia 08/09/2017  . Hyperlipidemia associated with type 2 diabetes mellitus ([place]) 08/08/2017  . Hypertension associated with diabetes ([place]) 08/08/2017    Reviewed chart prior to disease state call. Spoke with patient regarding BP  Recent Office Vitals: BP Readings from Last 3 Encounters:  10/22/20 139/67  08/04/20 (!) 143/75  06/26/20 135/73   Pulse Readings from Last 3 Encounters:  10/22/20 (!) 57  08/04/20 (!) 59  06/26/20 85    Wt Readings from Last 3 Encounters:  10/22/20 178 lb (80.7 kg)  09/07/20 174 lb (78.9 kg)  08/04/20 178 lb 3.2 oz (80.8 kg)     Kidney Function Lab Results  Component Value Date/Time   CREATININE 1.25 08/04/2020 02:03 PM   CREATININE 1.55 (H) 04/22/2020  09:42 AM   CREATININE 1.46 (H) 02/04/2020 01:28 PM   GFR 44.79 (L) 04/22/2020 09:42 AM   GFRNONAA 59 (L) 08/04/2020 02:03 PM   GFRAA 68 08/04/2020 02:03 PM    BMP Latest Ref Rng & Units 08/04/2020 04/22/2020 02/04/2020  Glucose 65 - 99 mg/dL 198(H) 159(H) 214(H)  BUN 7 - 25 mg/dL 20 30(H) 30(H)  Creatinine 0.70 - 1.25 mg/dL 1.25 1.55(H) 1.46(H)  BUN/Creat Ratio 6 - 22 (calc) NOT APPLICABLE - 21  Sodium 135 - 146 mmol/L 143 143 142  Potassium 3.5 - 5.3 mmol/L 3.4(L) 3.9 3.8  Chloride 98 - 110 mmol/L 103 103 101  CO2 20 - 32 mmol/L 30 32 32  Calcium 8.6 - 10.3 mg/dL 8.6 8.7 8.7      . Current antihypertensive regimen:               - allopurinol (ZYLOPRIM) 100 MG tablet              - amLODipine (NORVASC) 5 MG tablet             - metoprolol tartrate (LOPRESSOR) 100 MG tablet  . How often are you checking your Blood Pressure? 3-5x per week   . Current home BP readings: Patient stated his numbers have been ranging around 120/75s.  . What recent interventions/DTPs have been made by any provider to improve Blood Pressure control since last CPP Visit: None Noted  . Any recent hospitalizations or ED visits since last visit with CPP? No   . What diet changes have been made to improve Blood Pressure Control?  Patient stated he has no diet changes however patient states he does not add salt to any of his food .  [name] What exercise is being done to improve your Blood Pressure Control?  o Patient stated he has been exercising a little  Adherence Review: Is the patient currently on ACE/ARB medication? No Does the patient have >5 day gap between last estimated fill dates? No  Patient stated he would not like to reschedule follow up at this time.   Jerry York  Follow-Up:  Pharmacist Review

## 2021-01-22 NOTE — Progress Notes (Signed)
Office Visit Note  Patient: Jerry York             Date of Birth: 06/20/1951           MRN: [id number]             PCP: Ardith Dark, MD Referring: Ardith Dark, MD Visit Date: 02/04/2021 Occupation: @  Subjective:  Medication management.   History of Present Illness: Jerry York is a 70 y.o. male with history of gout and osteoarthritis.  He denies having any gout flares since her last visit.  He has been taking allopurinol and colchicine on a regular basis.  He states if he skips colchicine and starts having increased pain.  He continues to have some discomfort in his right hip and right knee joint due to underlying osteoarthritis.  He states that since his back surgery he could not walk a straight which puts his decrease stress on his joints.  Activities of Daily Living:  Patient reports morning stiffness for 0 minutes.   Patient Reports nocturnal pain.  Difficulty dressing/grooming: Denies Difficulty climbing stairs: Denies Difficulty getting out of chair: Denies Difficulty using hands for taps, buttons, cutlery, and/or writing: Denies  Review of Systems  Constitutional: Negative for fatigue.  HENT: Negative for mouth sores, mouth dryness and nose dryness.   Eyes: Negative for pain, itching, visual disturbance and dryness.  Respiratory: Negative for cough, hemoptysis, shortness of breath and difficulty breathing.   Cardiovascular: Negative for chest pain, palpitations and swelling in legs/feet.  Gastrointestinal: Negative for abdominal pain, blood in stool, constipation and diarrhea.  Endocrine: Negative for increased urination.  Genitourinary: Negative for painful urination.  Musculoskeletal: Positive for arthralgias and joint pain. Negative for joint swelling, myalgias, muscle weakness, morning stiffness, muscle tenderness and myalgias.  Skin: Negative for color change, rash and redness.  Allergic/Immunologic: Negative for susceptible to infections.   Neurological: Negative for dizziness, numbness, headaches, memory loss and weakness.  Hematological: Negative for swollen glands.  Psychiatric/Behavioral: Negative for confusion and sleep disturbance.    PMFS History:  Patient Active Problem List   Diagnosis Date Noted  . Pain due to onychomycosis of toenails of both feet 01/21/2020  . Neurogenic bladder 09/25/2019  . S/P lumbar laminectomy 09/16/2019  . Lower extremity edema 12/11/2017  . Family history of premature CAD 11/27/2017  . 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 Type 2 diabetes mellitus (HCC) 08/09/2017  . Gout 08/09/2017  . Anxiety with insomnia 08/09/2017  . Hyperlipidemia associated with type 2 diabetes mellitus (HCC) 08/08/2017  . Hypertension associated with diabetes (HCC) 08/08/2017    Past Medical History:  Diagnosis Date  . Anxiety   . Bilateral renal cysts   . Gout    02-22-2017 acute right foot gout---  per pt resolved  . Gout   . Gross hematuria   . Heart murmur   . History of BPH 08/14/2017  . Hydronephrosis, left   . Hyperlipidemia   . Hypertension   . Renal insufficiency   . Rheumatoid arthritis (HCC)   . Swelling   . Type 2 diabetes mellitus (HCC)   . Urinary retention   . Wears glasses     Family History  Problem Relation Age of Onset  . Congestive Heart Failure Mother        Related to valve disease.  Opted to treat medically  . Hypertension Mother   . Hyperlipidemia Mother   . Diabetes Mother   . Hypertension  Father   . Hyperlipidemia Father   . Coronary artery disease Father 20       History of CABG  . Stroke Father   . Hypertension Brother   . Diabetes Brother   . Hypertension Daughter   . Hypertension Son   . Hypertension Brother   . Hypertension Brother    Past Surgical History:  Procedure Laterality Date  . APPENDECTOMY  age 12  . CYSTOSCOPY WITH URETEROSCOPY AND STENT PLACEMENT Left 05/08/2017   Procedure: URETEROSCOPY AND STENT  PLACEMENT;  Surgeon: Ihor Gully, MD;  Location: [facility];  Service: Urology;  Laterality: Left;  . CYSTOSCOPY/RETROGRADE/URETEROSCOPY Bilateral 05/08/2017   Procedure: CYSTOSCOPY/ BILATERAL RETROGRADE;  Surgeon: Ihor Gully, MD;  Location: [facility];  Service: Urology;  Laterality: Bilateral;  . HEMATOMA EVACUATION N/A 09/20/2019   Procedure: EVACUATION LUMBAR EPIDURAL HEMATOMA;  Surgeon: Tressie Stalker, MD;  Location: [facility] OR;  Service: Neurosurgery;  Laterality: N/A;  EVACUATION LUMBAR EPIDURAL HEMATOMA  . INGUINAL HERNIA REPAIR Right 10/10/2000  . KNEE ARTHROSCOPY Right 1980's  . LUMBAR LAMINECTOMY/DECOMPRESSION MICRODISCECTOMY N/A 09/16/2019   Procedure: Laminectomy and Foraminotomy - Lumbar Two-Lumbar Three - Lumbar Three-Lumbar Four - Lumbar Four-Lumbar Five - Lumbar Five-Sacral One;  Surgeon: Tia Alert, MD;  Location: [facility] OR;  Service: Neurosurgery;  Laterality: N/A;  Laminectomy and Foraminotomy - Lumbar Two-Lumbar Three - Lumbar Three-Lumbar Four - Lumbar Four-Lumbar Five - Lumbar Five-Sacral One  . NASAL FRACTURE SURGERY     in highschool   . SHOULDER ARTHROSCOPY WITH OPEN ROTATOR CUFF REPAIR Right 1990's  . TRANSURETHRAL RESECTION OF PROSTATE     Social History   Social History Narrative  . Not on file   Immunization History  Administered Date(s) Administered  . Fluad Quad(high Dose 65+) 10/07/2019, 10/22/2020  . Influenza, High Dose Seasonal PF 10/12/2018  . Influenza,inj,Quad PF,6+ Mos 09/26/2017  . Influenza-Unspecified 09/26/2017  . PFIZER(Purple Top)SARS-COV-2 Vaccination 01/20/2020, 02/10/2020, 10/05/2020  . Pneumococcal Conjugate-13 11/10/2017  . Pneumococcal Polysaccharide-23 12/30/2013  . Tdap 11/02/2018     Objective: Vital Signs: BP 122/69 (BP Location: Left Arm, Patient Position: Sitting, Cuff Size: Normal)   Pulse 64   Ht 5\' 5"  (1.651 m)   Wt 176 lb (79.8 kg)   BMI 29.29 kg/m    Physical Exam Vitals and nursing  note reviewed.  Constitutional:      Appearance: He is well-developed and well-nourished.  HENT:     Head: Normocephalic and atraumatic.  Eyes:     Extraocular Movements: EOM normal.     Conjunctiva/sclera: Conjunctivae normal.     Pupils: Pupils are equal, round, and reactive to light.  Cardiovascular:     Rate and Rhythm: Normal rate and regular rhythm.     Heart sounds: Normal heart sounds.  Pulmonary:     Effort: Pulmonary effort is normal.     Breath sounds: Normal breath sounds.  Abdominal:     General: Bowel sounds are normal.     Palpations: Abdomen is soft.  Musculoskeletal:     Cervical back: Normal range of motion and neck supple.  Skin:    General: Skin is warm and dry.     Capillary Refill: Capillary refill takes less than 2 seconds.  Neurological:     Mental Status: He is alert and oriented to person, place, and time.  Psychiatric:        Mood and Affect: Mood and affect normal.        Behavior: Behavior normal.  Musculoskeletal Exam: C-spine was good range of motion.  He had limited extension and flexion of his lumbar spine with discomfort.  Shoulder joints and elbow joints with good range of motion.  He has bilateral DIP and PIP thickening with no synovitis.  He had limited range of motion of his right hip joint.  He had limited extension of bilateral knee joints consistent with osteoarthritis.  He had no tenderness over ankles or MTPs.  CDAI Exam: CDAI Score: -- Patient Global: --; Provider Global: -- Swollen: --; Tender: -- Joint Exam 02/04/2021   No joint exam has been documented for this visit   There is currently no information documented on the homunculus. Go to the Rheumatology activity and complete the homunculus joint exam.  Investigation: No additional findings.  Imaging: No results found.  Recent Labs: Lab Results  Component Value Date   WBC 4.5 08/04/2020   HGB 14.9 08/04/2020   PLT 129 (L) 08/04/2020   NA 143 08/04/2020   K 3.4  (L) 08/04/2020   CL 103 08/04/2020   CO2 30 08/04/2020   GLUCOSE 198 (H) 08/04/2020   BUN 20 08/04/2020   CREATININE 1.8 (A) 01/25/2021   BILITOT 0.6 08/04/2020   ALKPHOS 76 04/22/2020   AST 31 08/04/2020   ALT 37 08/04/2020   PROT 6.2 08/04/2020   ALBUMIN 3.9 04/22/2020   CALCIUM 8.6 08/04/2020   GFRAA 38 01/25/2021    Speciality Comments: No specialty comments available.  Procedures:  No procedures performed Allergies: Prednisone, Sulfa antibiotics, Azithromycin, Hydrocodone-acetaminophen, and Metformin and related   Assessment / Plan:     Visit Diagnoses: Idiopathic chronic gout of multiple sites without tophus -he has not had any gout flare since her last visit.  Allopurinol 300 mg by mouth daily and colchicine 0.6 mg 1 tablet by mouth daily.  Patient states that he is unable to taper colchicine.- Plan: Uric acid  Hyperuricemia - uric acid: 08/04/2020 5.5  Medication management - Plan: CBC with Differential/Platelet, COMPLETE METABOLIC PANEL WITH GFR to monitor medication side effects.  Primary osteoarthritis of right knee-he has limited extension but no warmth swelling or effusion was noted.  Primary osteoarthritis of right hip-he had limited range of motion.  DDD (degenerative disc disease), lumbar - 09/20/19 decompression and fusion surgery, by Dr. Yetta Barre.  He had limited range of motion with discomfort.  Essential hypertension-his blood pressure is well controlled.  Other medical problems are listed as follows:  Hyperlipidemia associated with type 2 diabetes mellitus (HCC)  Heart murmur  History of diabetes mellitus  History of BPH  History of anxiety  He is fully vaccinated against COVID-19 and also received a booster.  Use of mask, social distancing and hand hygiene was discussed.  Orders: Orders Placed This Encounter  Procedures  . CBC with Differential/Platelet  . COMPLETE METABOLIC PANEL WITH GFR  . Uric acid   No orders of the defined types were  placed in this encounter.   Follow-Up Instructions: Return in about 6 months (around 08/04/2021) for Gout.   Pollyann Savoy, MD  Note - This record has been created using Animal nutritionist.  Chart creation errors have been sought, but may not always  have been located. Such creation errors do not reflect on  the standard of medical care.

## 2021-01-25 DIAGNOSIS — E1165 Type 2 diabetes mellitus with hyperglycemia: Secondary | ICD-10-CM | POA: Diagnosis not present

## 2021-01-25 DIAGNOSIS — E785 Hyperlipidemia, unspecified: Secondary | ICD-10-CM | POA: Diagnosis not present

## 2021-01-25 DIAGNOSIS — Z794 Long term (current) use of insulin: Secondary | ICD-10-CM | POA: Diagnosis not present

## 2021-01-25 LAB — HEMOGLOBIN A1C: Hemoglobin A1C: 1312022

## 2021-01-25 LAB — COMPREHENSIVE METABOLIC PANEL
GFR calc Af Amer: 38
GFR calc non Af Amer: 46

## 2021-01-25 LAB — BASIC METABOLIC PANEL: Creatinine: 1.8 — AB (ref 0.6–1.3)

## 2021-01-28 ENCOUNTER — Other Ambulatory Visit: Payer: Self-pay | Admitting: Family Medicine

## 2021-02-01 ENCOUNTER — Telehealth: Payer: Self-pay

## 2021-02-01 NOTE — Chronic Care Management (AMB) (Addendum)
    Chronic Care Management Pharmacy Assistant   Name: Jerry York  MRN: [id number] DOB: 1951-10-30  Reason for Encounter: Medication Review    PCP : Jerry Barrack, MD  Allergies:   Allergies  Allergen Reactions  . Prednisone Other (See Comments)    Elevated blood sugar  . Sulfa Antibiotics Other (See Comments)    "make my feet burn"  . Azithromycin Nausea And Vomiting  . Hydrocodone-Acetaminophen Itching  . Metformin And Related Diarrhea    Medications: Outpatient Encounter Medications as of 02/01/2021  Medication Sig Note  . clonazePAM (KLONOPIN) 0.5 MG tablet Take 1 tablet by mouth twice daily   . traZODone (DESYREL) 100 MG tablet TAKE 2 TABLETS BY MOUTH EVERY DAY AT BEDTIME AS NEEDED FOR SLEEP   . allopurinol (ZYLOPRIM) 100 MG tablet Take 3 tablets (300 mg total) by mouth daily.   [name] amLODipine (NORVASC) 5 MG tablet Take 1 tablet by mouth once daily   . atorvastatin (LIPITOR) 40 MG tablet Take 1 tablet by mouth once daily   . [name] Take 500 mg by mouth 2 (two) times daily.    . colchicine 0.6 MG tablet TAKE 1 TABLET BY MOUTH DAILY. TAKE 1 TABLET BY MOUTH 2 TIMES A DAY IF EXPERIENCING A FLARE   . Continuous Blood Gluc Sensor ([name] 14 DAY SENSOR) MISC USE AS DIRECTED   . Continuous Blood Gluc Sensor ([name] 14 DAY SENSOR) MISC APPLY 1 KIT TOPICALLY EVERY 14 (FOURTEEN) DAYS   . glucose monitoring kit ([name]) monitoring kit 1 each by Does not apply route as needed for other. Check blood sugar at home up to 3 times daily   . HUMULIN R U-500 KWIKPEN 500 UNIT/ML kwikpen Inject 60-80 Units as directed See admin instructions. INJECT 80 UNITS BEFORE BREAKFAST 60 UNITS BEFORE LUNCH AND 60 UNITS BEFORE EVENING MEAL. Jerry York 09/20/2019: Pt states he will not be taking this unless [employer] agrees to pay for it. Dr. Buddy York submitted request 1-2 weeks ago  . metoprolol tartrate (LOPRESSOR) 100 MG tablet Take 1 tablet by mouth twice daily   .  torsemide (DEMADEX) 20 MG tablet Take 2 tablets (40 mg total) by mouth 2 (two) times daily.    No facility-administered encounter medications on file as of 02/01/2021.    Current Diagnosis: Patient Active Problem List   Diagnosis Date Noted  . Pain due to onychomycosis of toenails of both feet 01/21/2020  . Neurogenic bladder 09/25/2019  . S/P lumbar laminectomy 09/16/2019  . Lower extremity edema 12/11/2017  . Family history of premature CAD 11/27/2017  . 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 Type 2 diabetes mellitus ([place]) 08/09/2017  . Gout 08/09/2017  . Anxiety with insomnia 08/09/2017  . Hyperlipidemia associated with type 2 diabetes mellitus ([place]) 08/08/2017  . Hypertension associated with diabetes ([place]) 08/08/2017    Patient called the pharmacy wanting to transfer from [place] to [place].  Onboard process completed. Profile Transfer from [employer] completed.  The following medications had no remaining refills from Jerry Barrack, MD:  Clonazepam 0.5 mg tablet Allopurinol 100 mg tablet Colchine 0.6 mg tablet Toresemide 20 mg tablet [name] continuous glucose monitor  Jerry York, [place] Pharmacist Assistant [phone]   Follow-Up:  Coordination of Enhanced Pharmacy Services and Pharmacist Review   21 minutes spend in pharmacist review of call/pharmacy onboarding.

## 2021-02-04 ENCOUNTER — Ambulatory Visit: Payer: PPO | Admitting: Rheumatology

## 2021-02-04 ENCOUNTER — Other Ambulatory Visit: Payer: Self-pay

## 2021-02-04 ENCOUNTER — Encounter: Payer: Self-pay | Admitting: Family Medicine

## 2021-02-04 ENCOUNTER — Encounter: Payer: Self-pay | Admitting: Rheumatology

## 2021-02-04 VITALS — BP 122/69 | HR 64 | Ht 65.0 in | Wt 176.0 lb

## 2021-02-04 DIAGNOSIS — M1611 Unilateral primary osteoarthritis, right hip: Secondary | ICD-10-CM

## 2021-02-04 DIAGNOSIS — E1169 Type 2 diabetes mellitus with other specified complication: Secondary | ICD-10-CM

## 2021-02-04 DIAGNOSIS — M5136 Other intervertebral disc degeneration, lumbar region: Secondary | ICD-10-CM | POA: Diagnosis not present

## 2021-02-04 DIAGNOSIS — Z8639 Personal history of other endocrine, nutritional and metabolic disease: Secondary | ICD-10-CM | POA: Diagnosis not present

## 2021-02-04 DIAGNOSIS — Z8659 Personal history of other mental and behavioral disorders: Secondary | ICD-10-CM | POA: Diagnosis not present

## 2021-02-04 DIAGNOSIS — I10 Essential (primary) hypertension: Secondary | ICD-10-CM

## 2021-02-04 DIAGNOSIS — M1711 Unilateral primary osteoarthritis, right knee: Secondary | ICD-10-CM

## 2021-02-04 DIAGNOSIS — M1A09X0 Idiopathic chronic gout, multiple sites, without tophus (tophi): Secondary | ICD-10-CM

## 2021-02-04 DIAGNOSIS — Z87438 Personal history of other diseases of male genital organs: Secondary | ICD-10-CM

## 2021-02-04 DIAGNOSIS — E790 Hyperuricemia without signs of inflammatory arthritis and tophaceous disease: Secondary | ICD-10-CM

## 2021-02-04 DIAGNOSIS — Z79899 Other long term (current) drug therapy: Secondary | ICD-10-CM

## 2021-02-04 DIAGNOSIS — R011 Cardiac murmur, unspecified: Secondary | ICD-10-CM

## 2021-02-04 DIAGNOSIS — E785 Hyperlipidemia, unspecified: Secondary | ICD-10-CM

## 2021-02-04 NOTE — Patient Instructions (Signed)
Heart Disease Prevention   Your inflammatory disease increases your risk of heart disease which includes heart attack, stroke, atrial fibrillation (irregular heartbeats), high blood pressure, heart failure and atherosclerosis (plaque in the arteries).  It is important to reduce your risk by:   . Keep blood pressure, cholesterol, and blood sugar at healthy levels   . Smoking Cessation   . Maintain a healthy weight  o BMI 20-25   . Eat a healthy diet  o Plenty of fresh fruit, vegetables, and whole grains  o Limit saturated fats, foods high in sodium, and added sugars  o DASH and Mediterranean diet   . Increase physical activity  o Recommend moderate physically activity for 150 minutes per week/ 30 minutes a day for five days a week These can be broken up into three separate ten-minute sessions during the day.   . Reduce Stress  . Meditation, slow breathing exercises, yoga, coloring books  . Dental visits twice a year   

## 2021-02-05 LAB — CBC WITH DIFFERENTIAL/PLATELET
Absolute Monocytes: 303 cells/uL (ref 200–950)
Basophils Absolute: 11 cells/uL (ref 0–200)
Basophils Relative: 0.2 %
Eosinophils Absolute: 72 cells/uL (ref 15–500)
Eosinophils Relative: 1.3 %
HCT: 44.6 % (ref 38.5–50.0)
Hemoglobin: 15.8 g/dL (ref 13.2–17.1)
Lymphs Abs: 1903 cells/uL (ref 850–3900)
MCH: 31 pg (ref 27.0–33.0)
MCHC: 35.4 g/dL (ref 32.0–36.0)
MCV: 87.5 fL (ref 80.0–100.0)
MPV: 9.9 fL (ref 7.5–12.5)
Monocytes Relative: 5.5 %
Neutro Abs: 3212 cells/uL (ref 1500–7800)
Neutrophils Relative %: 58.4 %
Platelets: 123 10*3/uL — ABNORMAL LOW (ref 140–400)
RBC: 5.1 10*6/uL (ref 4.20–5.80)
RDW: 15.2 % — ABNORMAL HIGH (ref 11.0–15.0)
Total Lymphocyte: 34.6 %
WBC: 5.5 10*3/uL (ref 3.8–10.8)

## 2021-02-05 LAB — COMPLETE METABOLIC PANEL WITH GFR
AG Ratio: 1.7 (calc) (ref 1.0–2.5)
ALT: 57 U/L — ABNORMAL HIGH (ref 9–46)
AST: 36 U/L — ABNORMAL HIGH (ref 10–35)
Albumin: 4 g/dL (ref 3.6–5.1)
Alkaline phosphatase (APISO): 82 U/L (ref 35–144)
BUN/Creatinine Ratio: 24 (calc) — ABNORMAL HIGH (ref 6–22)
BUN: 44 mg/dL — ABNORMAL HIGH (ref 7–25)
CO2: 30 mmol/L (ref 20–32)
Calcium: 8.9 mg/dL (ref 8.6–10.3)
Chloride: 103 mmol/L (ref 98–110)
Creat: 1.81 mg/dL — ABNORMAL HIGH (ref 0.70–1.25)
GFR, Est African American: 44 mL/min/{1.73_m2} — ABNORMAL LOW (ref >=60)
GFR, Est Non African American: 38 mL/min/{1.73_m2} — ABNORMAL LOW (ref >=60)
Globulin: 2.4 g/dL (calc) (ref 1.9–3.7)
Glucose, Bld: 223 mg/dL — ABNORMAL HIGH (ref 65–99)
Potassium: 4 mmol/L (ref 3.5–5.3)
Sodium: 142 mmol/L (ref 135–146)
Total Bilirubin: 0.6 mg/dL (ref 0.2–1.2)
Total Protein: 6.4 g/dL (ref 6.1–8.1)

## 2021-02-05 LAB — URIC ACID: Uric Acid, Serum: 5.1 mg/dL (ref 4.0–8.0)

## 2021-02-05 NOTE — Progress Notes (Signed)
Uric acid is in desirable range.  Platelets are low and stable.  Creatinine is higher than before.  LFTs are elevated, most likely due to statin use.  Please forward results to his PCP.

## 2021-02-17 DIAGNOSIS — R799 Abnormal finding of blood chemistry, unspecified: Secondary | ICD-10-CM | POA: Diagnosis not present

## 2021-03-04 ENCOUNTER — Telehealth: Payer: Self-pay

## 2021-03-04 ENCOUNTER — Other Ambulatory Visit: Payer: Self-pay

## 2021-03-05 MED ORDER — CLONAZEPAM 0.5 MG PO TABS: 0.5000 mg | ORAL_TABLET | Freq: Two times a day (BID) | ORAL | 0 refills | Status: DC

## 2021-03-05 NOTE — Telephone Encounter (Signed)
LAST APPOINTMENT DATE: 10/22/2020   NEXT APPOINTMENT DATE: 04/23/2021    LAST REFILL: 12/09/2020  QTY: 180

## 2021-04-23 ENCOUNTER — Ambulatory Visit (INDEPENDENT_AMBULATORY_CARE_PROVIDER_SITE_OTHER): Payer: PPO | Admitting: Family Medicine

## 2021-04-23 ENCOUNTER — Other Ambulatory Visit: Payer: Self-pay

## 2021-04-23 VITALS — BP 102/59 | HR 54 | Temp 98.1°F | Ht 65.0 in | Wt 173.0 lb

## 2021-04-23 DIAGNOSIS — M25512 Pain in left shoulder: Secondary | ICD-10-CM | POA: Diagnosis not present

## 2021-04-23 DIAGNOSIS — N183 Chronic kidney disease, stage 3 unspecified: Secondary | ICD-10-CM | POA: Diagnosis not present

## 2021-04-23 DIAGNOSIS — E1122 Type 2 diabetes mellitus with diabetic chronic kidney disease: Secondary | ICD-10-CM

## 2021-04-23 DIAGNOSIS — E1159 Type 2 diabetes mellitus with other circulatory complications: Secondary | ICD-10-CM | POA: Diagnosis not present

## 2021-04-23 DIAGNOSIS — E785 Hyperlipidemia, unspecified: Secondary | ICD-10-CM

## 2021-04-23 DIAGNOSIS — G8929 Other chronic pain: Secondary | ICD-10-CM | POA: Diagnosis not present

## 2021-04-23 DIAGNOSIS — M1A09X0 Idiopathic chronic gout, multiple sites, without tophus (tophi): Secondary | ICD-10-CM

## 2021-04-23 DIAGNOSIS — F419 Anxiety disorder, unspecified: Secondary | ICD-10-CM | POA: Diagnosis not present

## 2021-04-23 DIAGNOSIS — E1169 Type 2 diabetes mellitus with other specified complication: Secondary | ICD-10-CM

## 2021-04-23 DIAGNOSIS — Z794 Long term (current) use of insulin: Secondary | ICD-10-CM | POA: Diagnosis not present

## 2021-04-23 DIAGNOSIS — I152 Hypertension secondary to endocrine disorders: Secondary | ICD-10-CM | POA: Diagnosis not present

## 2021-04-23 NOTE — Assessment & Plan Note (Signed)
Stable.  Continue trazodone 200 mg daily and Klonopin 0.5 mg twice daily as needed.

## 2021-04-23 NOTE — Progress Notes (Signed)
   STRAN RAPER is a 70 y.o. male who presents today for an office visit.  Assessment/Plan:  Chronic Problems Addressed Today: Anxiety with insomnia Stable.  Continue trazodone 200 mg daily and Klonopin 0.5 mg twice daily as needed.  Gout Managed by rheumatology.  No recent flares.  Type 2 diabetes mellitus (HCC) Managed by endocrinology.  Hypertension associated with diabetes (HCC) At goal on amlodipine 5 mg daily and metoprolol tartrate 100 mg twice daily.  Hyperlipidemia associated with type 2 diabetes mellitus (HCC) Continue Lipitor 40 mg daily.  We will need to check lipids next blood draw.  Chronic left shoulder pain No red flags.  Likely has some rotator cuff tendinopathy. Has had steroid injections in the past which has worked well.  Will place referral to sports medicine for further evaluation.     Subjective:  HPI:  See A/P for status of chronic conditions.       Objective:  Physical Exam: BP (!) 102/59   Pulse (!) 54   Temp 98.1 F (36.7 C) (Temporal)   Ht 5\' 5"  (1.651 m)   Wt 173 lb (78.5 kg)   SpO2 96%   BMI 28.79 kg/m   Gen: No acute distress, resting comfortably CV: Regular rate and rhythm with no murmurs appreciated Pulm: Normal work of breathing, clear to auscultation bilaterally with no crackles, wheezes, or rhonchi MSK: Left shoulder with tenderness to palpation along anterior edge.  Pain elicited with resisted supraspinatus testing at and external/internal rotation. Neuro: Grossly normal, moves all extremities Psych: Normal affect and thought content      Zebulen Simonis M. , MD 04/23/2021 1:22 PM

## 2021-04-23 NOTE — Assessment & Plan Note (Signed)
Managed by endocrinology.

## 2021-04-23 NOTE — Assessment & Plan Note (Signed)
At goal on amlodipine 5 mg daily and metoprolol tartrate 100 mg twice daily.

## 2021-04-23 NOTE — Assessment & Plan Note (Signed)
No red flags.  Likely has some rotator cuff tendinopathy. Has had steroid injections in the past which has worked well.  Will place referral to sports medicine for further evaluation.

## 2021-04-23 NOTE — Assessment & Plan Note (Signed)
Continue Lipitor 40 mg daily.  We will need to check lipids next blood draw.

## 2021-04-23 NOTE — Assessment & Plan Note (Signed)
Managed by rheumatology.  No recent flares.

## 2021-04-23 NOTE — Patient Instructions (Signed)
It was very nice to see you today!  I will refer you to see sports medicine.  Continue current medications.  I will see you back in 6 months for your annual checkup with labs.  Please come back to see me sooner if needed.  Take care, Dr [name]  PLEASE NOTE:  If you had any lab tests please let us know if you have not heard back within a few days. You may see your results on mychart before we have a chance to review them but we will give you a call once they are reviewed by [name]. If we ordered any referrals today, please let us know if you have not heard from their office within the next week.   Please try these tips to maintain a healthy lifestyle:   Eat at least 3 REAL meals and 1-2 snacks per day.  Aim for no more than 5 hours between eating.  If you eat breakfast, please do so within one hour of getting up.    Each meal should contain half fruits/vegetables, one quarter protein, and one quarter carbs (no bigger than a computer mouse)   Cut down on sweet beverages. This includes juice, soda, and sweet tea.     Drink at least 1 glass of water with each meal and aim for at least 8 glasses per day   Exercise at least 150 minutes every week.

## 2021-04-26 DIAGNOSIS — E785 Hyperlipidemia, unspecified: Secondary | ICD-10-CM | POA: Diagnosis not present

## 2021-04-26 DIAGNOSIS — Z794 Long term (current) use of insulin: Secondary | ICD-10-CM | POA: Diagnosis not present

## 2021-04-26 DIAGNOSIS — E119 Type 2 diabetes mellitus without complications: Secondary | ICD-10-CM | POA: Diagnosis not present

## 2021-05-03 ENCOUNTER — Telehealth: Payer: Self-pay

## 2021-05-03 MED ORDER — ATORVASTATIN CALCIUM 40 MG PO TABS: 1.0000 | ORAL_TABLET | Freq: Every day | ORAL | 1 refills | Status: AC

## 2021-05-03 NOTE — Progress Notes (Signed)
Chronic Care Management Pharmacy Assistant   Name: XZANDER GILHAM  MRN: [id number] DOB: 17-Apr-1951   Reason for Encounter: Medication coordination call   Recent office visits:  04/23/21- Dimas Chyle MD (PCP)- seen for diabetes follow up. No medication changes.  Referral to sports medicine.  Follow up in 6 months.   Recent consult visits:  02/04/21- Jaynie Collins MD (Rheumatology) - seen for Idiopathic chronic gout of multiple sites without tophus. Labs ordered. No medication changes. Follow up in 6 months.   Hospital visits:  None in previous 6 months  Medications: Outpatient Encounter Medications as of 05/03/2021  Medication Sig Note  . allopurinol (ZYLOPRIM) 100 MG tablet Take 3 tablets (300 mg total) by mouth daily.   [name] amLODipine (NORVASC) 5 MG tablet Take 1 tablet by mouth once daily   . atorvastatin (LIPITOR) 40 MG tablet Take 1 tablet by mouth once daily   . [name] Take 500 mg by mouth 2 (two) times daily.    . clonazePAM (KLONOPIN) 0.5 MG tablet Take 1 tablet (0.5 mg total) by mouth 2 (two) times daily.   . colchicine 0.6 MG tablet TAKE 1 TABLET BY MOUTH DAILY. TAKE 1 TABLET BY MOUTH 2 TIMES A DAY IF EXPERIENCING A FLARE   . Continuous Blood Gluc Sensor ([name] 14 DAY SENSOR) MISC USE AS DIRECTED   . Continuous Blood Gluc Sensor ([name] 14 DAY SENSOR) MISC APPLY 1 KIT TOPICALLY EVERY 14 (FOURTEEN) DAYS   . glucose monitoring kit ([name]) monitoring kit 1 each by Does not apply route as needed for other. Check blood sugar at home up to 3 times daily   . HUMULIN R U-500 KWIKPEN 500 UNIT/ML kwikpen Inject 60-80 Units as directed See admin instructions. INJECT 80 UNITS BEFORE BREAKFAST 60 UNITS BEFORE LUNCH AND 60 UNITS BEFORE EVENING MEAL. [place] 09/20/2019: Pt states he will not be taking this unless [employer] agrees to pay for it. Dr. Buddy Duty submitted request 1-2 weeks ago  . metoprolol tartrate (LOPRESSOR) 100 MG tablet Take 1 tablet by  mouth twice daily   . torsemide (DEMADEX) 20 MG tablet Take 2 tablets (40 mg total) by mouth 2 (two) times daily.   . traZODone (DESYREL) 100 MG tablet TAKE 2 TABLETS BY MOUTH EVERY DAY AT BEDTIME AS NEEDED FOR SLEEP    No facility-administered encounter medications on file as of 05/03/2021.    Reviewed chart for medication changes ahead of medication coordination call.  No OVs, Consults, or hospital visits since last care coordination call/Pharmacist visit. (If appropriate, list visit date, provider name)  No medication changes indicated OR if recent visit, treatment plan here.  BP Readings from Last 3 Encounters:  04/23/21 (!) 102/59  02/04/21 122/69  10/22/20 139/67    Lab Results  Component Value Date   HGBA1C 3,358,251 01/25/2021     Patient obtains medications through Adherence Packaging  90 Days   Last adherence delivery included: (medication name and frequency) Atorvastatin 21m take 1 at breakfast    Metoprolol Tartrate 1023mtake 1 at breakfast Torsemide 2052make 2 at breakfast and 2 with evening meal   Amlodipine Besylate 5mg22mke 1 at breakast  Patient is due for next adherence delivery on: 05/03/2021 Called patient and reviewed medications and coordinated delivery.  This delivery to include: Atorvastatin 40mg75me 1 at breakfast    Metoprolol Tartrate 100mg 15m 1 at breakfast Torsemide 20mg t33m2 at breakfast and 2 with evening meal  Amlodipine Besylate 58m take 1 at [employer] Confirmed delivery date of 05/03/21, advised patient that pharmacy will contact them the morning of delivery.  AWilford SportsCPA, CMA

## 2021-05-04 ENCOUNTER — Other Ambulatory Visit: Payer: Self-pay | Admitting: Family Medicine

## 2021-05-05 ENCOUNTER — Other Ambulatory Visit: Payer: Self-pay | Admitting: Family Medicine

## 2021-05-18 ENCOUNTER — Telehealth: Payer: Self-pay

## 2021-05-18 NOTE — Chronic Care Management (AMB) (Signed)
Chronic Care Management Pharmacy Assistant   Name: Jerry York  MRN: [id number] DOB: [birth year]   Reason for Encounter: Hypertension Disease State Call  Recent office visits:  No visits noted  Recent consult visits:  No visits noted  Hospital visits:  None in previous 6 months  Medications: Outpatient Encounter Medications as of 05/18/2021  Medication Sig Note  . allopurinol (ZYLOPRIM) 100 MG tablet Take 3 tablets by mouth once daily   . amLODipine (NORVASC) 5 MG tablet Take 1 tablet by mouth once daily   . atorvastatin (LIPITOR) 40 MG tablet Take 1 tablet (40 mg total) by mouth daily.   [name] [name] Take 500 mg by mouth 2 (two) times daily.    . clonazePAM (KLONOPIN) 0.5 MG tablet Take 1 tablet (0.5 mg total) by mouth 2 (two) times daily.   . colchicine 0.6 MG tablet TAKE 1 TABLET BY MOUTH ONCE DAILY AND 1 TABLET 2 TIMES DAILY IF EXPERIENCING A FLARE   . Continuous Blood Gluc Sensor ([name] 14 DAY SENSOR) MISC USE AS DIRECTED   . Continuous Blood Gluc Sensor ([name] 14 DAY SENSOR) MISC APPLY 1 KIT TOPICALLY EVERY 14 (FOURTEEN) DAYS   . glucose monitoring kit ([name]) monitoring kit 1 each by Does not apply route as needed for other. Check blood sugar at home up to 3 times daily   . HUMULIN R U-500 KWIKPEN 500 UNIT/ML kwikpen Inject 60-80 Units as directed See admin instructions. INJECT 80 UNITS BEFORE BREAKFAST 60 UNITS BEFORE LUNCH AND 60 UNITS BEFORE EVENING MEAL. [place] 09/20/2019: Pt states he will not be taking this unless [employer] agrees to pay for it. Dr. Buddy Duty submitted request 1-2 weeks ago  . metoprolol tartrate (LOPRESSOR) 100 MG tablet Take 1 tablet by mouth twice daily   . torsemide (DEMADEX) 20 MG tablet Take 2 tablets (40 mg total) by mouth 2 (two) times daily.   . traZODone (DESYREL) 100 MG tablet TAKE 2 TABLETS BY MOUTH EVERY DAY AT BEDTIME AS NEEDED FOR SLEEP    No facility-administered encounter medications on file as of  05/18/2021.    Reviewed chart prior to disease state call. Spoke with patient regarding BP  Recent Office Vitals: BP Readings from Last 3 Encounters:  04/23/21 (!) 102/59  02/04/21 122/69  10/22/20 139/67   Pulse Readings from Last 3 Encounters:  04/23/21 (!) 54  02/04/21 64  10/22/20 (!) 57    Wt Readings from Last 3 Encounters:  04/23/21 173 lb (78.5 kg)  02/04/21 176 lb (79.8 kg)  10/22/20 178 lb (80.7 kg)     Kidney Function Lab Results  Component Value Date/Time   CREATININE 1.81 (H) 02/04/2021 02:13 PM   CREATININE 1.8 (A) 01/25/2021 12:00 AM   CREATININE 1.25 08/04/2020 02:03 PM   GFR 44.79 (L) 04/22/2020 09:42 AM   GFRNONAA 38 (L) 02/04/2021 02:13 PM   GFRAA 44 (L) 02/04/2021 02:13 PM    BMP Latest Ref Rng & Units 02/04/2021 01/25/2021 08/04/2020  Glucose 65 - 99 mg/dL 223(H) - 198(H)  BUN 7 - 25 mg/dL 44(H) - 20  Creatinine 0.70 - 1.25 mg/dL 1.81(H) 1.8(A) 1.25  BUN/Creat Ratio 6 - 22 (calc) 83(F) - NOT APPLICABLE  Sodium 290 - 146 mmol/L 142 - 143  Potassium 3.5 - 5.3 mmol/L 4.0 - 3.4(L)  Chloride 98 - 110 mmol/L 103 - 103  CO2 20 - 32 mmol/L 30 - 30  Calcium 8.6 - 10.3 mg/dL 8.9 - 8.6  Current antihypertensive regimen:  amlodipine 5 mg daily   metoprolol tartrate 100 mg twice daily.  How often are you checking your Blood Pressure?  Patient stated he checks his BP couple times a week  Current home BP readings: 109/62,114/70,106/65,107/69  What recent interventions/DTPs have been made by any provider to improve Blood Pressure control since last CPP Visit:  No recent interventions  Any recent hospitalizations or ED visits since last visit with CPP? No  What diet changes have been made to improve Blood Pressure Control?  Patient has had no changes in diet  What exercise is being done to improve your Blood Pressure Control?  Patient uses a walker to get around and is unable to stand straight and stated that he can't do many things  Adherence  Review: Is the patient currently on ACE/ARB medication? No Does the patient have >5 day gap between last estimated fill dates? No amlodipine 5 mg-90 DS last filled 05/04/21  metoprolol tartrate 100 mg- 90 DS last filled 05/04/21  Star Rating Drugs: Atorvastatin 40 mg- 90 DS last filled 05/04/21  [place] [facility] CPA, CMA

## 2021-05-20 ENCOUNTER — Ambulatory Visit (INDEPENDENT_AMBULATORY_CARE_PROVIDER_SITE_OTHER): Payer: PPO

## 2021-05-20 DIAGNOSIS — Z1211 Encounter for screening for malignant neoplasm of colon: Secondary | ICD-10-CM | POA: Diagnosis not present

## 2021-05-20 DIAGNOSIS — Z0000 Encounter for general adult medical examination without abnormal findings: Secondary | ICD-10-CM | POA: Diagnosis not present

## 2021-05-20 NOTE — Progress Notes (Signed)
Virtual Visit via Telephone Note  I connected with  Karna Dupes on 05/20/21 at  2:30 PM EDT by telephone and verified that I am speaking with the correct person using two identifiers.  Medicare Annual Wellness visit completed telephonically due to Covid-19 pandemic.   Persons participating in this call: This Health Coach and this patient.   Location: Patient: Home  Provider: Office    I discussed the limitations, risks, security and privacy concerns of performing an evaluation and management service by telephone and the availability of in person appointments. The patient expressed understanding and agreed to proceed.  Unable to perform video visit due to video visit attempted and failed and/or patient does not have video capability.   Some vital signs may be absent or patient reported.   Willette Brace, LPN    Subjective:   JONATHANDAVID MARLETT is a 70 y.o. male who presents for Medicare Annual/Subsequent preventive examination.  Review of Systems     Cardiac Risk Factors include: advanced age (>70mn, >>33women);diabetes mellitus;hypertension;male gender;dyslipidemia     Objective:    There were no vitals filed for this visit. There is no height or weight on file to calculate BMI.  Advanced Directives 05/20/2021 06/26/2020 03/31/2020 10/02/2019 09/24/2019 09/20/2019 09/16/2019  Does Patient Have a Medical Advance Directive? _0  No No  Does patient want to make changes to medical advance directive? Yes (MAU/Ambulatory/Procedural Areas - Information given) - - - - - -  Would patient like information on creating a medical advance directive? - - Yes (MAU/Ambulatory/Procedural Areas - Information given) No - Patient declined No - Patient declined No - Patient declined No - Patient declined    Current Medications (verified) Outpatient Encounter Medications as of 05/20/2021  Medication Sig  . acetaminophen (TYLENOL) 325 MG tablet 1 capsule as needed  . allopurinol  (ZYLOPRIM) 100 MG tablet Take 3 tablets by mouth once daily  . amLODipine (NORVASC) 5 MG tablet Take 1 tablet by mouth once daily  . atorvastatin (LIPITOR) 40 MG tablet Take 1 tablet (40 mg total) by mouth daily.  .Marland KitchenCINNAMON PO Take 500 mg by mouth 2 (two) times daily.   . clonazePAM (KLONOPIN) 0.5 MG tablet Take 1 tablet (0.5 mg total) by mouth 2 (two) times daily.  . colchicine 0.6 MG tablet TAKE 1 TABLET BY MOUTH ONCE DAILY AND 1 TABLET 2 TIMES DAILY IF EXPERIENCING A FLARE  . Continuous Blood Gluc Sensor ([name] 14 DAY SENSOR) MISC USE AS DIRECTED  . Continuous Blood Gluc Sensor ([name] 14 DAY SENSOR) MISC APPLY 1 KIT TOPICALLY EVERY 14 (FOURTEEN) DAYS  . glucose monitoring kit ([name]) monitoring kit 1 each by Does not apply route as needed for other. Check blood sugar at home up to 3 times daily  . HUMULIN R U-500 KWIKPEN 500 UNIT/ML kwikpen Inject 60-80 Units as directed See admin instructions. INJECT 80 UNITS BEFORE BREAKFAST 60 UNITS BEFORE LUNCH AND 60 UNITS BEFORE EVENING MEAL. 30 MINUTES BEFORE MEALS  . metoprolol tartrate (LOPRESSOR) 100 MG tablet Take 1 tablet by mouth twice daily  . torsemide (DEMADEX) 20 MG tablet Take 2 tablets (40 mg total) by mouth 2 (two) times daily.  . traZODone (DESYREL) 100 MG tablet TAKE 2 TABLETS BY MOUTH EVERY DAY AT BEDTIME AS NEEDED FOR SLEEP   No facility-administered encounter medications on file as of 05/20/2021.    Allergies (verified) Prednisone, Sulfa antibiotics, Azithromycin, Empagliflozin, Erythromycin, Hydrocodone-acetaminophen, Liraglutide, and Metformin and related  History: Past Medical History:  Diagnosis Date  . Anxiety   . Bilateral renal cysts   . Gout    02-22-2017 acute right foot gout---  per pt resolved  . Gout   . Gross hematuria   . Heart murmur   . History of BPH 08/14/2017  . Hydronephrosis, left   . Hyperlipidemia   . Hypertension   . Renal insufficiency   . Rheumatoid arthritis ([place])   .  Swelling   . Type 2 diabetes mellitus ([place])   . Urinary retention   . Wears glasses    Past Surgical History:  Procedure Laterality Date  . APPENDECTOMY  age 81  . CYSTOSCOPY WITH URETEROSCOPY AND STENT PLACEMENT Left 05/08/2017   Procedure: URETEROSCOPY AND STENT PLACEMENT;  Surgeon: Kathie Rhodes, MD;  Location: [facility];  Service: Urology;  Laterality: Left;  . CYSTOSCOPY/RETROGRADE/URETEROSCOPY Bilateral 05/08/2017   Procedure: CYSTOSCOPY/ BILATERAL RETROGRADE;  Surgeon: Kathie Rhodes, MD;  Location[facility];  Service: Urology;  Laterality: Bilateral;  . HEMATOMA EVACUATION N/A 09/20/2019   Procedure: EVACUATION LUMBAR EPIDURAL HEMATOMA;  Surgeon: Newman Pies, MD;  Location: [facility];  Service: Neurosurgery;  Laterality: N/A;  EVACUATION LUMBAR EPIDURAL HEMATOMA  . INGUINAL HERNIA REPAIR Right 10/10/2000  . KNEE ARTHROSCOPY Right 1980's  . LUMBAR LAMINECTOMY/DECOMPRESSION MICRODISCECTOMY N/A 09/16/2019   Procedure: Laminectomy and Foraminotomy - Lumbar Two-Lumbar Three - Lumbar Three-Lumbar Four - Lumbar Four-Lumbar Five - Lumbar Five-Sacral One;  Surgeon: Eustace Moore, MD;  Location: [facility];  Service: Neurosurgery;  Laterality: N/A;  Laminectomy and Foraminotomy - Lumbar Two-Lumbar Three - Lumbar Three-Lumbar Four - Lumbar Four-Lumbar Five - Lumbar Five-Sacral One  . NASAL FRACTURE SURGERY     in highschool   . SHOULDER ARTHROSCOPY WITH OPEN ROTATOR CUFF REPAIR Right 1990's  . TRANSURETHRAL RESECTION OF PROSTATE     Family History  Problem Relation Age of Onset  . Congestive Heart Failure Mother        Related to valve disease.  Opted to treat medically  . Hypertension Mother   . Hyperlipidemia Mother   . Diabetes Mother   . Hypertension Father   . Hyperlipidemia Father   . Coronary artery disease Father 11       History of CABG  . Stroke Father   . Hypertension Brother   . Diabetes Brother   . Hypertension Daughter   . Hypertension Son    . Hypertension Brother   . Hypertension Brother    Social History   Socioeconomic History  . Marital status: Married    Spouse name: Malachy Mood  . Number of children: Not on file  . Years of education: Not on file  . Highest education level: Not on file  Occupational History  . Occupation: Retired  Tobacco Use  . Smoking status: Never Smoker  . Smokeless tobacco: Never Used  Vaping Use  . Vaping Use: Never used  Substance and Sexual Activity  . Alcohol use: No  . Drug use: No  . Sexual activity: Yes    Birth control/protection: Surgical    Comment: vasectomy  Other Topics Concern  . Not on file  Social History Narrative  . Not on file   Social Determinants of Health   Financial Resource Strain: Low Risk   . Difficulty of Paying Living Expenses: Not hard at all  Food Insecurity: No Food Insecurity  . Worried About Charity fundraiser in the Last Year: Never true  . Ran Out of  Food in the Last Year: Never true  Transportation Needs: No Transportation Needs  . Lack of Transportation (Medical): No  . Lack of Transportation (Non-Medical): No  Physical Activity: Insufficiently Active  . Days of Exercise per Week: 3 days  . Minutes of Exercise per Session: 30 min  Stress: No Stress Concern Present  . Feeling of Stress : Not at all  Social Connections: Moderately Isolated  . Frequency of Communication with Friends and Family: Once a week  . Frequency of Social Gatherings with Friends and Family: Never  . Attends Religious Services: 1 to 4 times per year  . Active Member of Clubs or Organizations: No  . Attends Archivist Meetings: Never  . Marital Status: Married    Tobacco Counseling Counseling given: Not Answered   Clinical Intake:  Pre-visit preparation completed: Yes  Pain : No/denies pain     BMI - recorded: 27.86 Nutritional Status: BMI > 30  Obese Nutritional Risks: None Diabetes: Yes CBG done?: Yes (154) CBG resulted in Enter/ Edit  results?: No Did pt. bring in CBG monitor from home?: No  How often do you need to have someone help you when you read instructions, pamphlets, or other written materials from your doctor or pharmacy?: 1 - Never  Diabetic?Nutrition Risk Assessment:  Has the patient had any N/V/D within the last 2 months?  No  Does the patient have any non-healing wounds?  No  Has the patient had any unintentional weight loss or weight gain?  No   Diabetes:  Is the patient diabetic?  Yes  If diabetic, was a CBG obtained today?  Yes  Did the patient bring in their glucometer from home?  No  How often do you monitor your CBG's? Free style libre .   Financial Strains and Diabetes Management:  Are you having any financial strains with the device, your supplies or your medication? No .  Does the patient want to be seen by Chronic Care Management for management of their diabetes?  No  Would the patient like to be referred to a Nutritionist or for Diabetic Management?  No   Diabetic Exams:  Diabetic Eye Exam: Completed 01/26/21 Diabetic Foot Exam: Overdue, Pt has been advised about the importance in completing this exam. Pt is scheduled for diabetic foot exam on next appt .   Interpreter Needed?: No  Information entered by :: Charlott Rakes, LPN   Activities of Daily Living In your present state of health, do you have any difficulty performing the following activities: 05/20/2021 04/23/2021  Hearing? N N  Vision? N N  Difficulty concentrating or making decisions? N N  Walking or climbing stairs? Y Y  Dressing or bathing? N N  Doing errands, shopping? N N  Preparing Food and eating ? N -  Using the Toilet? N -  In the past six months, have you accidently leaked urine? N -  Do you have problems with loss of bowel control? N -  Managing your Medications? N -  Managing your Finances? N -  Housekeeping or managing your Housekeeping? N -  Some recent data might be hidden    Patient Care  Team: [name], MD as PCP - General (Family Medicine) Kerry Fort., MD (Urology) Delrae Rend, MD as Consulting Physician (Endocrinology) Gardiner Barefoot, DPM as Consulting Physician (Podiatry) Madelin Rear, [facility] as Pharmacist (Pharmacist)  Indicate any recent Medical Services you may have received from other than [facility] providers in the past year (date  may be approximate).     Assessment:   This is a routine wellness examination for [place].  Hearing/Vision screen  Hearing Screening   125Hz 250Hz 500Hz 1000Hz 2000Hz 3000Hz 4000Hz 6000Hz 8000Hz  Right ear:           Left ear:           Comments: Pt denies any hearing issues   Vision Screening Comments: Pt follows up with Dr at [facility] for annual eye exams   Dietary issues and exercise activities discussed: Current Exercise Habits: Home exercise routine, Type of exercise: walking, Time (Minutes): 30, Frequency (Times/Week): 3, Weekly Exercise (Minutes/Week): 90  Goals Addressed            This Visit's Progress   . Patient Stated       None at this time      Depression Screen PHQ 2/9 Scores 05/20/2021 04/23/2021 03/31/2020 01/28/2019 11/29/2018 11/23/2018 10/22/2018  PHQ - 2 Score 0 0 1 4 0 0 0  PHQ- 9 Score - - - 12 - - -  Exception Documentation - - - Medical reason - - -    Fall Risk Fall Risk  05/20/2021 04/23/2021 03/31/2020 11/29/2018 11/23/2018  Falls in the past year? 0 0 0 1 1  Number falls in past yr: 0 - 0 0 0  Injury with Fall? 0 - 0 0 1  Risk for fall due to : Impaired vision - Impaired balance/gait;Impaired mobility History of fall(s) History of fall(s);Impaired vision;Impaired balance/gait;Medication side effect;Impaired mobility  Follow up Falls prevention discussed - Falls evaluation completed;Education provided;Falls prevention discussed Falls evaluation completed;Falls prevention discussed Falls evaluation completed;Education provided    FALL RISK PREVENTION PERTAINING TO THE HOME:  Any  stairs in or around the home? Yes  If so, are there any without handrails? No  Home free of loose throw rugs in walkways, pet beds, electrical cords, etc? Yes  Adequate lighting in your home to reduce risk of falls? Yes   ASSISTIVE DEVICES UTILIZED TO PREVENT FALLS:  Life alert? No  Use of a cane, walker or w/c? Yes  Grab bars in the bathroom? Yes  Shower chair or bench in shower? Yes  Elevated toilet seat or a handicapped toilet? No   TIMED UP AND GO:  Was the test performed? No .      Cognitive Function:     6CIT Screen 05/20/2021 03/31/2020  What Year? 0 points 0 points  What month? 0 points 0 points  What time? 0 points 0 points  Count back from 20 0 points 0 points  Months in reverse 0 points 0 points  Repeat phrase 0 points 0 points  Total Score 0 0    Immunizations Immunization History  Administered Date(s) Administered  . Fluad Quad(high Dose 65+) 10/07/2019, 10/22/2020  . Influenza Split 10/12/2018  . Influenza, High Dose Seasonal PF 10/12/2018  . Influenza,inj,Quad PF,6+ Mos 09/26/2017  . Influenza-Unspecified 09/26/2017  . PFIZER(Purple Top)SARS-COV-2 Vaccination 01/20/2020, 02/10/2020, 10/05/2020  . Pneumococcal Conjugate-13 11/10/2017  . Pneumococcal Polysaccharide-23 12/30/2013  . Tdap 11/02/2018    TDAP status: Up to date  Flu Vaccine status: Up to date  Pneumococcal vaccine status: Up to date  Covid-19 vaccine status: Completed vaccines  Qualifies for Shingles Vaccine? Yes   Zostavax completed No   Shingrix Completed?: No.    Education has been provided regarding the importance of this vaccine. Patient has been advised to call insurance company to determine out of pocket expense if they  have not yet received this vaccine. Advised may also receive vaccine at local pharmacy or Health Dept. Verbalized acceptance and understanding.  Screening Tests Health Maintenance  Topic Date Due  . Fecal DNA (Cologuard)  Never done  . Zoster Vaccines-  Shingrix (1 of 2) Never done  . PNA vac Low Risk Adult (2 of 2 - PPSV23) 12/30/2018  . FOOT EXAM  01/20/2021  . URINE MICROALBUMIN  04/22/2021  . HEMOGLOBIN A1C  07/25/2021  . INFLUENZA VACCINE  07/26/2021  . OPHTHALMOLOGY EXAM  01/26/2022  . TETANUS/TDAP  11/02/2028  . COVID-19 Vaccine  Completed  . Hepatitis C Screening  Completed  . HPV VACCINES  Aged Out    Health Maintenance  Health Maintenance Due  Topic Date Due  . Fecal DNA (Cologuard)  Never done  . Zoster Vaccines- Shingrix (1 of 2) Never done  . PNA vac Low Risk Adult (2 of 2 - PPSV23) 12/30/2018  . FOOT EXAM  01/20/2021  . URINE MICROALBUMIN  04/22/2021    Colorectal cancer screening: Referral to GI placed 05/20/21. Pt aware the office will call re: appt.  Additional Screening:  Hepatitis C Screening:  Completed 08/14/17  Vision Screening: Recommended annual ophthalmology exams for early detection of glaucoma and other disorders of the eye. Is the patient up to date with their annual eye exam?  Yes  Who is the provider or what is the name of the office in which the patient attends annual eye exams? [facility]  If pt is not established with a provider, would they like to be referred to a provider to establish care? No .   Dental Screening: Recommended annual dental exams for proper oral hygiene  Community Resource Referral / Chronic Care Management: CRR required this visit?  No   CCM required this visit?  No      Plan:     I have personally reviewed and noted the following in the patient's chart:   . Medical and social history . Use of alcohol, tobacco or illicit drugs  . Current medications and supplements including opioid prescriptions. Patient is not currently taking opioid prescriptions. . Functional ability and status . Nutritional status . Physical activity . Advanced directives . List of other physicians . Hospitalizations, surgeries, and ER visits in previous 12  months . Vitals . Screenings to include cognitive, depression, and falls . Referrals and appointments  In addition, I have reviewed and discussed with patient certain preventive protocols, quality metrics, and best practice recommendations. A written personalized care plan for preventive services as well as general preventive health recommendations were provided to patient.     Willette Brace, LPN   2/29/7989   Nurse Notes: None

## 2021-05-20 NOTE — Patient Instructions (Addendum)
[name] , Thank you for taking time to come for your Medicare Wellness Visit. I appreciate your ongoing commitment to your health goals. Please review the following plan we discussed and let me know if I can assist you in the future.   Screening recommendations/referrals: Colonoscopy: order placed today for  cologuard  Recommended yearly ophthalmology/optometry visit for glaucoma screening and checkup Recommended yearly dental visit for hygiene and checkup  Vaccinations: Influenza vaccine: Up to date Pneumococcal vaccine: Up to date Tdap vaccine: Up to date Shingles vaccine: Shingrix discussed. Please contact your pharmacy for coverage information.    Covid-19: Completed 1/25, 2/15, & 10/05/20  Advanced directives: .Please bring a copy of your health care power of attorney and living will to the office at your convenience.  Conditions/risks identified: none at this time  Next appointment: Follow up in one year for your annual wellness visit.   Preventive Care 46 Years and Older, Male Preventive care refers to lifestyle choices and visits with your health care provider that can promote health and wellness. What does preventive care include?  A yearly physical exam. This is also called an annual well check.  Dental exams once or twice a year.  Routine eye exams. Ask your health care provider how often you should have your eyes checked.  Personal lifestyle choices, including:  Daily care of your teeth and gums.  Regular physical activity.  Eating a healthy diet.  Avoiding tobacco and drug use.  Limiting alcohol use.  Practicing safe sex.  Taking low doses of aspirin every day.  Taking vitamin and mineral supplements as recommended by your health care provider. What happens during an annual well check? The services and screenings done by your health care provider during your annual well check will depend on your age, overall health, lifestyle risk factors, and family  history of disease. Counseling  Your health care provider may ask you questions about your:  Alcohol use.  Tobacco use.  Drug use.  Emotional well-being.  Home and relationship well-being.  Sexual activity.  Eating habits.  History of falls.  Memory and ability to understand (cognition).  Work and work Astronomer. Screening  You may have the following tests or measurements:  Height, weight, and BMI.  Blood pressure.  Lipid and cholesterol levels. These may be checked every 5 years, or more frequently if you are over 30 years old.  Skin check.  Lung cancer screening. You may have this screening every year starting at age 87 if you have a 30-pack-year history of smoking and currently smoke or have quit within the past 15 years.  Fecal occult blood test (FOBT) of the stool. You may have this test every year starting at age 29.  Flexible sigmoidoscopy or colonoscopy. You may have a sigmoidoscopy every 5 years or a colonoscopy every 10 years starting at age 7.  Prostate cancer screening. Recommendations will vary depending on your family history and other risks.  Hepatitis C blood test.  Hepatitis B blood test.  Sexually transmitted disease (STD) testing.  Diabetes screening. This is done by checking your blood sugar (glucose) after you have not eaten for a while (fasting). You may have this done every 1-3 years.  Abdominal aortic aneurysm (AAA) screening. You may need this if you are a current or former smoker.  Osteoporosis. You may be screened starting at age 62 if you are at high risk. Talk with your health care provider about your test results, treatment options, and if necessary, the need  for more tests. Vaccines  Your health care provider may recommend certain vaccines, such as:  Influenza vaccine. This is recommended every year.  Tetanus, diphtheria, and acellular pertussis (Tdap, Td) vaccine. You may need a Td booster every 10 years.  Zoster vaccine.  You may need this after age 37.  Pneumococcal 13-valent conjugate (PCV13) vaccine. One dose is recommended after age 76.  Pneumococcal polysaccharide (PPSV23) vaccine. One dose is recommended after age 55. Talk to your health care provider about which screenings and vaccines you need and how often you need them. This information is not intended to replace advice given to you by your health care provider. Make sure you discuss any questions you have with your health care provider. Document Released: 01/08/2016 Document Revised: 08/31/2016 Document Reviewed: 10/13/2015 Elsevier Interactive Patient Education  2017 [place] Prevention in the Home Falls can cause injuries. They can happen to people of all ages. There are many things you can do to make your home safe and to help prevent falls. What can I do on the outside of my home?  Regularly fix the edges of walkways and driveways and fix any cracks.  Remove anything that might make you trip as you walk through a door, such as a raised step or threshold.  Trim any bushes or trees on the path to your home.  Use bright outdoor lighting.  Clear any walking paths of anything that might make someone trip, such as rocks or tools.  Regularly check to see if handrails are loose or broken. Make sure that both sides of any steps have handrails.  Any raised decks and porches should have guardrails on the edges.  Have any leaves, snow, or ice cleared regularly.  Use sand or salt on walking paths during winter.  Clean up any spills in your garage right away. This includes oil or grease spills. What can I do in the bathroom?  Use night lights.  Install grab bars by the toilet and in the tub and shower. Do not use towel bars as grab bars.  Use non-skid mats or decals in the tub or shower.  If you need to sit down in the shower, use a plastic, non-slip stool.  Keep the floor dry. Clean up any water that spills on the floor as soon  as it happens.  Remove soap buildup in the tub or shower regularly.  Attach bath mats securely with double-sided non-slip rug tape.  Do not have throw rugs and other things on the floor that can make you trip. What can I do in the bedroom?  Use night lights.  Make sure that you have a light by your bed that is easy to reach.  Do not use any sheets or blankets that are too big for your bed. They should not hang down onto the floor.  Have a firm chair that has side arms. You can use this for support while you get dressed.  Do not have throw rugs and other things on the floor that can make you trip. What can I do in the kitchen?  Clean up any spills right away.  Avoid walking on wet floors.  Keep items that you use a lot in easy-to-reach places.  If you need to reach something above you, use a strong step stool that has a grab bar.  Keep electrical cords out of the way.  Do not use floor polish or wax that makes floors slippery. If you must use wax, use  non-skid floor wax.  Do not have throw rugs and other things on the floor that can make you trip. What can I do with my stairs?  Do not leave any items on the stairs.  Make sure that there are handrails on both sides of the stairs and use them. Fix handrails that are broken or loose. Make sure that handrails are as long as the stairways.  Check any carpeting to make sure that it is firmly attached to the stairs. Fix any carpet that is loose or worn.  Avoid having throw rugs at the top or bottom of the stairs. If you do have throw rugs, attach them to the floor with carpet tape.  Make sure that you have a light switch at the top of the stairs and the bottom of the stairs. If you do not have them, ask someone to add them for you. What else can I do to help prevent falls?  Wear shoes that:  Do not have high heels.  Have rubber bottoms.  Are comfortable and fit you well.  Are closed at the toe. Do not wear sandals.  If  you use a stepladder:  Make sure that it is fully opened. Do not climb a closed stepladder.  Make sure that both sides of the stepladder are locked into place.  Ask someone to hold it for you, if possible.  Clearly mark and make sure that you can see:  Any grab bars or handrails.  First and last steps.  Where the edge of each step is.  Use tools that help you move around (mobility aids) if they are needed. These include:  Canes.  Walkers.  Scooters.  Crutches.  Turn on the lights when you go into a dark area. Replace any light bulbs as soon as they burn out.  Set up your furniture so you have a clear path. Avoid moving your furniture around.  If any of your floors are uneven, fix them.  If there are any pets around you, be aware of where they are.  Review your medicines with your doctor. Some medicines can make you feel dizzy. This can increase your chance of falling. Ask your doctor what other things that you can do to help prevent falls. This information is not intended to replace advice given to you by your health care provider. Make sure you discuss any questions you have with your health care provider. Document Released: 10/08/2009 Document Revised: 05/19/2016 Document Reviewed: 01/16/2015 Elsevier Interactive Patient Education  2017 [employer].

## 2021-06-07 ENCOUNTER — Other Ambulatory Visit: Payer: Self-pay | Admitting: Family Medicine

## 2021-06-09 ENCOUNTER — Encounter: Payer: Self-pay | Admitting: Family Medicine

## 2021-06-09 ENCOUNTER — Other Ambulatory Visit: Payer: Self-pay | Admitting: Family Medicine

## 2021-06-10 MED ORDER — CLONAZEPAM 0.5 MG PO TABS: 0.5000 mg | ORAL_TABLET | Freq: Two times a day (BID) | ORAL | 1 refills | Status: DC

## 2021-06-10 NOTE — Telephone Encounter (Signed)
Pt requesting refill for Clonazepam.

## 2021-06-10 NOTE — Telephone Encounter (Signed)
See other message, duplicate

## 2021-06-24 ENCOUNTER — Telehealth (INDEPENDENT_AMBULATORY_CARE_PROVIDER_SITE_OTHER): Payer: PPO | Admitting: Family Medicine

## 2021-06-24 DIAGNOSIS — U071 COVID-19: Secondary | ICD-10-CM

## 2021-06-24 MED ORDER — MOLNUPIRAVIR EUA 200MG CAPSULE: 4.0000 | ORAL_CAPSULE | Freq: Two times a day (BID) | ORAL | 0 refills | Status: AC

## 2021-06-24 MED ORDER — BENZONATATE 100 MG PO CAPS: 100.0000 mg | ORAL_CAPSULE | Freq: Three times a day (TID) | ORAL | 0 refills | Status: DC | PRN

## 2021-06-24 NOTE — Patient Instructions (Addendum)
HOME CARE TIPS:  -[place] testing information: https://www.rivera-powers.org/ [place] [phone] Most pharmacies also offer testing and home test kits. If the Covid19 test is positive, please make a prompt follow up visit with your primary care office or with [facility] to discuss treatment options. Treatments for Covid19 are best given early in the course of the illness.   -I sent the medication(s) we discussed to your pharmacy: Meds ordered this encounter  Medications   molnupiravir EUA 200 mg CAPS    Sig: Take 4 capsules (800 mg total) by mouth 2 (two) times daily for 5 days.    Dispense:  40 capsule    Refill:  0   benzonatate (TESSALON PERLES) 100 MG capsule    Sig: Take 1 capsule (100 mg total) by mouth 3 (three) times daily as needed.    Dispense:  20 capsule    Refill:  0     -I sent in the [facility] treatment or referral you requested per our discussion. Please see the information provided below and discuss further with the pharmacist/treatment team.   -can use nasal saline a few times per day if you have nasal congestion  -stay hydrated, drink plenty of fluids and eat small healthy meals - avoid dairy  -can take 1000 IU (61mg) Vit D3 and 100-500 mg of Vit C daily per instructions  -If the Covid test is positive, check out the [facility] for more information on home care, transmission and treatment for COVID19  -follow up with your doctor in 2-3 days unless improving and feeling better  -stay home while sick, except to seek medical care. If you have COVID19, ideally it would be best to stay home for a full 10 days since the onset of symptoms PLUS one day of no fever and feeling better. Wear a good mask that fits snugly (such as N95 or KN95) if around others to reduce the risk of transmission.  It was nice to meet you today, and I really hope you are feeling better soon. I help [facility] out with telemedicine visits on Tuesdays and  Thursdays and am available for visits on those days. If you have any concerns or questions following this visit please schedule a follow up visit with your Primary Care doctor or seek care at a local urgent care clinic to avoid delays in care.    Seek in person care or schedule a follow up video visit promptly if your symptoms worsen, new concerns arise or you are not improving with treatment. Call 911 and/or seek emergency care if your symptoms are severe or life threatening.    Fact Sheet for Patients And Caregivers Emergency Use Authorization (EUA) Of LAGEVRIOT (molnupiravir) capsules For Coronavirus Disease 2019 (COVID-19)  What is the most important information I should know about LAGEVRIO? LAGEVRIO may cause serious side effects, including: ? LAGEVRIO may cause harm to your unborn baby. It is not known if LAGEVRIO will harm your baby if you take LAGEVRIO during pregnancy. o LAGEVRIO is not recommended for use in pregnancy. o LAGEVRIO has not been studied in pregnancy. LAGEVRIO was studied in pregnant animals only. When LAGEVRIO was given to pregnant animals, LAGEVRIO caused harm to their unborn babies. o You and your healthcare provider may decide that you should take LAGEVRIO during pregnancy if there are no other COVID-19 treatment options approved or authorized by the FDA that are accessible or clinically appropriate for you. o If you and your healthcare provider decide that you should take L[place]  pregnancy, you and your healthcare provider should discuss the known and potential benefits and the potential risks of taking LAGEVRIO during pregnancy. For individuals who are able to become pregnant: ? You should use a reliable method of birth control (contraception) consistently and correctly during treatment with LAGEVRIO and for 4 days after the last dose of LAGEVRIO. Talk to your healthcare provider about reliable birth control methods. ? Before starting treatment with  [facility] your healthcare provider may do a pregnancy test to see if you are pregnant before starting treatment with LAGEVRIO. ? Tell your healthcare provider right away if you become pregnant or think you may be pregnant during treatment with LAGEVRIO. Pregnancy Surveillance Program: ? There is a pregnancy surveillance program for individuals who take LAGEVRIO during pregnancy. The purpose of this program is to collect information about the health of you and your baby. Talk to your healthcare provider about how to take part in this program. ? If you take LAGEVRIO during pregnancy and you agree to participate in the pregnancy surveillance program and allow your healthcare provider to share your information with [place], then your healthcare provider will report your use of [place] during pregnancy to [place]. by calling [phone] or [email or web address]. For individuals who are sexually active with partners who are able to become pregnant: ? It is not known if LAGEVRIO can affect sperm. While the risk is regarded as low, animal studies to fully assess the potential for LAGEVRIO to affect the babies of males treated with LAGEVRIO have not been completed. A reliable method of birth control (contraception) should be used consistently and correctly during treatment with LAGEVRIO and for at least 3 months after the last dose. The risk to sperm beyond 3 months is not known. Studies to understand the risk to sperm beyond 3 months are ongoing. Talk to your healthcare provider about reliable birth control methods. Talk to your healthcare provider if you have questions or concerns about how LAGEVRIO may affect sperm. You are being given this fact sheet because your healthcare provider believes it is necessary to provide you with LAGEVRIO for the treatment of adults with mild-to-moderate coronavirus disease 2019 (COVID-19) with positive results of direct  SARS-CoV-2 viral testing, and who are at high risk for progression to severe COVID-19 including hospitalization or death, and for whom other COVID-19 treatment options approved or authorized by the FDA are not accessible or clinically appropriate. The U.S. Food and Drug Administration (FDA) has issued an Emergency Use Authorization (EUA) to make LAGEVRIO available during the COVID-19 pandemic (for more details about an EUA please see "What is an Emergency Use Authorization?" at the end of this document). LAGEVRIO is not an FDA-approved medicine in the [place]. Read this Fact Sheet for information about LAGEVRIO. Talk to your healthcare provider about your options if you have any questions. It is your choice to take LAGEVRIO.  What is COVID-19? COVID-19 is caused by a virus called a coronavirus. You can get COVID-19 through close contact with another person who has the virus. COVID-19 illnesses have ranged from very mild-to-severe, including illness resulting in death. While information so far suggests that most COVID-19 illness is mild, serious illness can happen and may cause some of your other medical conditions to become worse. Older people and people of all ages with severe, long lasting (chronic) medical conditions like heart disease, lung disease and diabetes, for example seem to be at higher risk of being hospitalized for COVID-19.  What is LAGEVRIO? LAGEVRIO is an investigational medicine used to treat mild-to-moderate COVID-19 in adults: ? with positive results of direct SARS-CoV-2 viral testing, and ? who are at high risk for progression to severe COVID-19 including hospitalization or death, and for whom other COVID-19 treatment options approved or authorized by the FDA are not accessible or clinically appropriate. The FDA has authorized the emergency use of LAGEVRIO for the treatment of mild-tomoderate COVID-19 in adults under an EUA. For more information on EUA, see the  "What is an Emergency Use Authorization (EUA)?" section at the end of this Fact Sheet. LAGEVRIO is not authorized: ? for use in people less than 8 years of age. ? for prevention of COVID-19. ? for people needing hospitalization for COVID-19. ? for use for longer than 5 consecutive days.  What should I tell my healthcare provider before I take LAGEVRIO? Tell your healthcare provider if you: ? Have any allergies ? Are breastfeeding or plan to breastfeed ? Have any serious illnesses ? Are taking any medicines (prescription, over-the-counter, vitamins, or herbal products).  How do I take LAGEVRIO? ? Take LAGEVRIO exactly as your healthcare provider tells you to take it. ? Take 4 capsules of LAGEVRIO every 12 hours (for example, at 8 am and at 8 pm) ? Take LAGEVRIO for 5 days. It is important that you complete the full 5 days of treatment with LAGEVRIO. Do not stop taking LAGEVRIO before you complete the full 5 days of treatment, even if you feel better. ? Take LAGEVRIO with or without food. ? You should stay in isolation for as long as your healthcare provider tells you to. Talk to your healthcare provider if you are not sure about how to properly isolate while you have COVID-19. ? Swallow LAGEVRIO capsules whole. Do not open, break, or crush the capsules. If you cannot swallow capsules whole, tell your healthcare provider. ? What to do if you miss a dose: o If it has been less than 10 hours since the missed dose, take it as soon as you remember o If it has been more than 10 hours since the missed dose, skip the missed dose and take your dose at the next scheduled time. ? Do not double the dose of LAGEVRIO to make up for a missed dose.  What are the important possible side effects of LAGEVRIO? ? See, "What is the most important information I should know about LAGEVRIO?" ? Allergic Reactions. Allergic reactions can happen in people taking LAGEVRIO, even after only 1 dose. Stop taking  LAGEVRIO and call your healthcare provider right away if you get any of the following symptoms of an allergic reaction: o hives o rapid heartbeat o trouble swallowing or breathing o swelling of the mouth, lips, or face o throat tightness o hoarseness o skin rash The most common side effects of LAGEVRIO are: ? diarrhea ? nausea ? dizziness These are not all the possible side effects of LAGEVRIO. Not many people have taken LAGEVRIO. Serious and unexpected side effects may happen. This medicine is still being studied, so it is possible that all of the risks are not known at this time.  What other treatment choices are there?  Veklury (remdesivir) is FDA-approved as an intravenous (IV) infusion for the treatment of mildto-moderate NATFT-73 in certain adults and children. Talk with your doctor to see if Marijean Heath is appropriate for you. Like LAGEVRIO, FDA may also allow for the emergency use of other medicines to treat people with COVID-19. Go to  [email or web address].[email or web address] for more information. It is your choice to be treated or not to be treated with LAGEVRIO. Should you decide not to take it, it will not change your standard medical care.  What if I am breastfeeding? Breastfeeding is not recommended during treatment with LAGEVRIO and for 4 days after the last dose of LAGEVRIO. If you are breastfeeding or plan to breastfeed, talk to your healthcare provider about your options and specific situation before taking LAGEVRIO.  How do I report side effects with LAGEVRIO? Contact your healthcare provider if you have any side effects that bother you or do not go away. Report side effects to FDA MedWatch at SmoothHits.hu or call 1-800-FDA-1088 (1- 2148379080).  How should I store [place]? ? Store LAGEVRIO capsules at room temperature between 52F to 68F (20C to 25C). ? Keep LAGEVRIO  and all medicines out of the reach of children and pets. How can I learn more about COVID-19? ? Ask your healthcare provider. ? Visit SeekRooms.co.uk ? Contact your local or state public health department. ? Call [place] at [phone] (toll free in the [place] ? Visit [email or web address].com  What Is an Emergency Use Authorization (EUA)? The [place] FDA has made [place] available under an emergency access mechanism called an Emergency Use Authorization (EUA) The EUA is supported by a [employer], broadcasting Health and Human Service (HHS) declaration that circumstances exist to justify emergency use of drugs and biological products during the COVID-19 pandemic. LAGEVRIO for the treatment of mild-to-moderate COVID-19 in adults with positive results of direct SARS-CoV-2 viral testing, who are at high risk for progression to severe COVID-19, including hospitalization or death, and for whom alternative COVID-19 treatment options approved or authorized by FDA are not accessible or clinically appropriate, has not undergone the same type of review as an FDA-approved product. In issuing an EUA under the JQZES-92 public health emergency, the FDA has determined, among other things, that based on the total amount of scientific evidence available including data from adequate and well-controlled clinical trials, if available, it is reasonable to believe that the product may be effective for diagnosing, treating, or preventing COVID-19, or a serious or life-threatening disease or condition caused by COVID-19; that the known and potential benefits of the product, when used to diagnose, treat, or prevent such disease or condition, outweigh the known and potential risks of such product; and that there are no adequate, approved, and available alternatives.  All of these criteria must be met to allow for the product to be used in the treatment of patients during the COVID-19 pandemic. The EUA for  LAGEVRIO is in effect for the duration of the COVID-19 declaration justifying emergency use of LAGEVRIO, unless terminated or revoked (after which LAGEVRIO may no longer be used under the EUA). For patent information: http://rogers.info/ Copyright  2021-2022 [place] [place], [place] and its affiliates. All rights reserved. [id number] Revised: March 2022

## 2021-06-24 NOTE — Progress Notes (Signed)
Virtual Visit via Video Note  I connected with Jerry York  on 06/24/21 at 10:20 AM EDT by a video enabled telemedicine application and verified that I am speaking with the correct person using two identifiers.  Location patient: home, [place] Location provider:work or home office Persons participating in the virtual visit: patient, provider, patient's wife  I discussed the limitations of evaluation and management by telemedicine and the availability of in person appointments. The patient expressed understanding and agreed to proceed.   HPI:  Acute telemedicine visit for Covid19: -Onset: yesterday, had positive home covid tests - they were on vacation las week and they were exposed closely to family members with covid -Symptoms include: nasal congestion, feels tired, cough,  -Denies:fevers, CP, SOB, NVD, inability to eat/drink/get out of bed -Pertinent past medical history: see below -Pertinent medication allergies: Allergies  Allergen Reactions   Prednisone Other (See Comments)    Elevated blood sugar   Sulfa Antibiotics Other (See Comments)    "make my feet burn"   Azithromycin Nausea And Vomiting   Empagliflozin Other (See Comments)   Erythromycin    Hydrocodone-Acetaminophen Itching   Liraglutide Other (See Comments)   Metformin And Related Diarrhea  -COVID-19 vaccine status: 2 doses + 2 boosters -no recent labs  ROS: See pertinent positives and negatives per HPI.  Past Medical History:  Diagnosis Date   Anxiety    Bilateral renal cysts    Gout    02-22-2017 acute right foot gout---  per pt resolved   Gout    Gross hematuria    Heart murmur    History of BPH 08/14/2017   Hydronephrosis, left    Hyperlipidemia    Hypertension    Renal insufficiency    Rheumatoid arthritis ([place])    Swelling    Type 2 diabetes mellitus ([place])    Urinary retention    Wears glasses     Past Surgical History:  Procedure Laterality Date   APPENDECTOMY  age 20   CYSTOSCOPY WITH  URETEROSCOPY AND STENT PLACEMENT Left 05/08/2017   Procedure: URETEROSCOPY AND STENT PLACEMENT;  Surgeon: Kathie Rhodes, MD;  [facility];  Service: Urology;  Laterality: Left;   CYSTOSCOPY/RETROGRADE/URETEROSCOPY Bilateral 05/08/2017   Procedure: CYSTOSCOPY/ BILATERAL RETROGRADE;  Surgeon: Kathie Rhodes, MD;  [facility];  Service: Urology;  Laterality: Bilateral;   HEMATOMA EVACUATION N/A 09/20/2019   Procedure: EVACUATION LUMBAR EPIDURAL HEMATOMA;  Surgeon: Newman Pies, MD;  Location: [place];  Service: Neurosurgery;  Laterality: N/A;  EVACUATION LUMBAR EPIDURAL HEMATOMA   INGUINAL HERNIA REPAIR Right 10/10/2000   KNEE ARTHROSCOPY Right 1980's   LUMBAR LAMINECTOMY/DECOMPRESSION MICRODISCECTOMY N/A 09/16/2019   Procedure: Laminectomy and Foraminotomy - Lumbar Two-Lumbar Three - Lumbar Three-Lumbar Four - Lumbar Four-Lumbar Five - Lumbar Five-Sacral One;  Surgeon: Eustace Moore, MD;  Location: [facility] OR;  Service: Neurosurgery;  Laterality: N/A;  Laminectomy and Foraminotomy - Lumbar Two-Lumbar Three - Lumbar Three-Lumbar Four - Lumbar Four-Lumbar Five - Lumbar Five-Sacral One   NASAL FRACTURE SURGERY     in highschool    SHOULDER ARTHROSCOPY WITH OPEN ROTATOR CUFF REPAIR Right 1990's   TRANSURETHRAL RESECTION OF PROSTATE       Current Outpatient Medications:    benzonatate (TESSALON PERLES) 100 MG capsule, Take 1 capsule (100 mg total) by mouth 3 (three) times daily as needed., Disp: 20 capsule, Rfl: 0   molnupiravir EUA 200 mg CAPS, Take 4 capsules (800 mg total) by mouth 2 (two) times daily for 5  days., Disp: 40 capsule, Rfl: 0   acetaminophen (TYLENOL) 325 MG tablet, 1 capsule as needed, Disp: , Rfl:    allopurinol (ZYLOPRIM) 100 MG tablet, Take 3 tablets by mouth once daily, Disp: 90 tablet, Rfl: 0   amLODipine (NORVASC) 5 MG tablet, Take 1 tablet by mouth once daily, Disp: 90 tablet, Rfl: 0   atorvastatin (LIPITOR) 40 MG tablet, Take 1  tablet (40 mg total) by mouth daily., Disp: 90 tablet, Rfl: 1   [name], Take 500 mg by mouth 2 (two) times daily. , Disp: , Rfl:    clonazePAM (KLONOPIN) 0.5 MG tablet, Take 1 tablet (0.5 mg total) by mouth 2 (two) times daily., Disp: 180 tablet, Rfl: 1   colchicine 0.6 MG tablet, TAKE 1 TABLET BY MOUTH ONCE DAILY TAKE  2  TABLETS  DAILY  IF  EXPERIENCING  A  FLARE, Disp: 60 tablet, Rfl: 0   Continuous Blood Gluc Sensor ([name] 14 DAY SENSOR) MISC, USE AS DIRECTED, Disp: 1 each, Rfl: 0   Continuous Blood Gluc Sensor ([name] 14 DAY SENSOR) MISC, APPLY 1 KIT TOPICALLY EVERY 14 (FOURTEEN) DAYS, Disp: 6 each, Rfl: 0   glucose monitoring kit ([name]) monitoring kit, 1 each by Does not apply route as needed for other. Check blood sugar at home up to 3 times daily, Disp: 1 each, Rfl: 0   HUMULIN R U-500 KWIKPEN 500 UNIT/ML kwikpen, Inject 60-80 Units as directed See admin instructions. INJECT 80 UNITS BEFORE BREAKFAST 60 UNITS BEFORE LUNCH AND 60 UNITS BEFORE EVENING MEAL. 30 MINUTES BEFORE MEALS, Disp: , Rfl:    metoprolol tartrate (LOPRESSOR) 100 MG tablet, Take 1 tablet by mouth twice daily, Disp: 180 tablet, Rfl: 0   torsemide (DEMADEX) 20 MG tablet, Take 2 tablets (40 mg total) by mouth 2 (two) times daily., Disp: 360 tablet, Rfl: 3   traZODone (DESYREL) 100 MG tablet, TAKE 2 TABLETS BY MOUTH EVERY DAY AT BEDTIME AS NEEDED FOR SLEEP, Disp: 180 tablet, Rfl: 0  EXAM:  VITALS per patient if applicable:  GENERAL: alert, oriented, appears well and in no acute distress  HEENT: atraumatic, conjunttiva clear, no obvious abnormalities on inspection of external nose and ears  NECK: normal movements of the head and neck  LUNGS: on inspection no signs of respiratory distress, breathing rate appears normal, no obvious gross SOB, gasping or wheezing  CV: no obvious cyanosis  MS: moves all visible extremities without noticeable abnormality  PSYCH/NEURO: pleasant and cooperative,  no obvious depression or anxiety, speech and thought processing grossly intact  ASSESSMENT AND PLAN:  Discussed the following assessment and plan:  COVID-19   Discussed treatment options, ideal treatment window, potential complications, isolation and precautions for COVID-19.  After lengthy discussion, the patient opted for treatment with molnupirivir due to being higher risk for complications of covid or severe disease and other factors. Discussed EUA status of this drug and the fact that there is preliminary limited knowledge of risks/interactions/side effects per EUA document vs possible benefits and precautions. This information was shared with patient during the visit and also was provided in patient instructions. Also, advised that patient discuss risks/interactions and use with pharmacist/treatment team as well. The patient did want a prescription for cough, Tessalon Rx sent.  Other symptomatic care measures summarized in patient instructions. Work/School slipped offered: declined Scheduled follow up with PCP offered: as needed per patient Advised to seek prompt in person care if worsening, new symptoms arise, or if is not improving with treatment.  Discussed options for inperson care if PCP office not available. Did let this patient know that I only do telemedicine on Tuesdays and Thursdays for [place]. Advised to schedule follow up visit with PCP or [facility] if any further questions or concerns to avoid delays in care.   I discussed the assessment and treatment plan with the patient. The patient was provided an opportunity to ask questions and all were answered. The patient agreed with the plan and demonstrated an understanding of the instructions.     Lucretia Kern, DO

## 2021-06-30 IMAGING — CR DG CHEST 2V
2 series · 2 of 2 positions shown · non-contrast
Comparison: Chest x-ray 09/05/2018.

CLINICAL DATA: Spinal stenosis.  Preoperative study.

EXAM:
CHEST - 2 VIEW

[w chest pa]
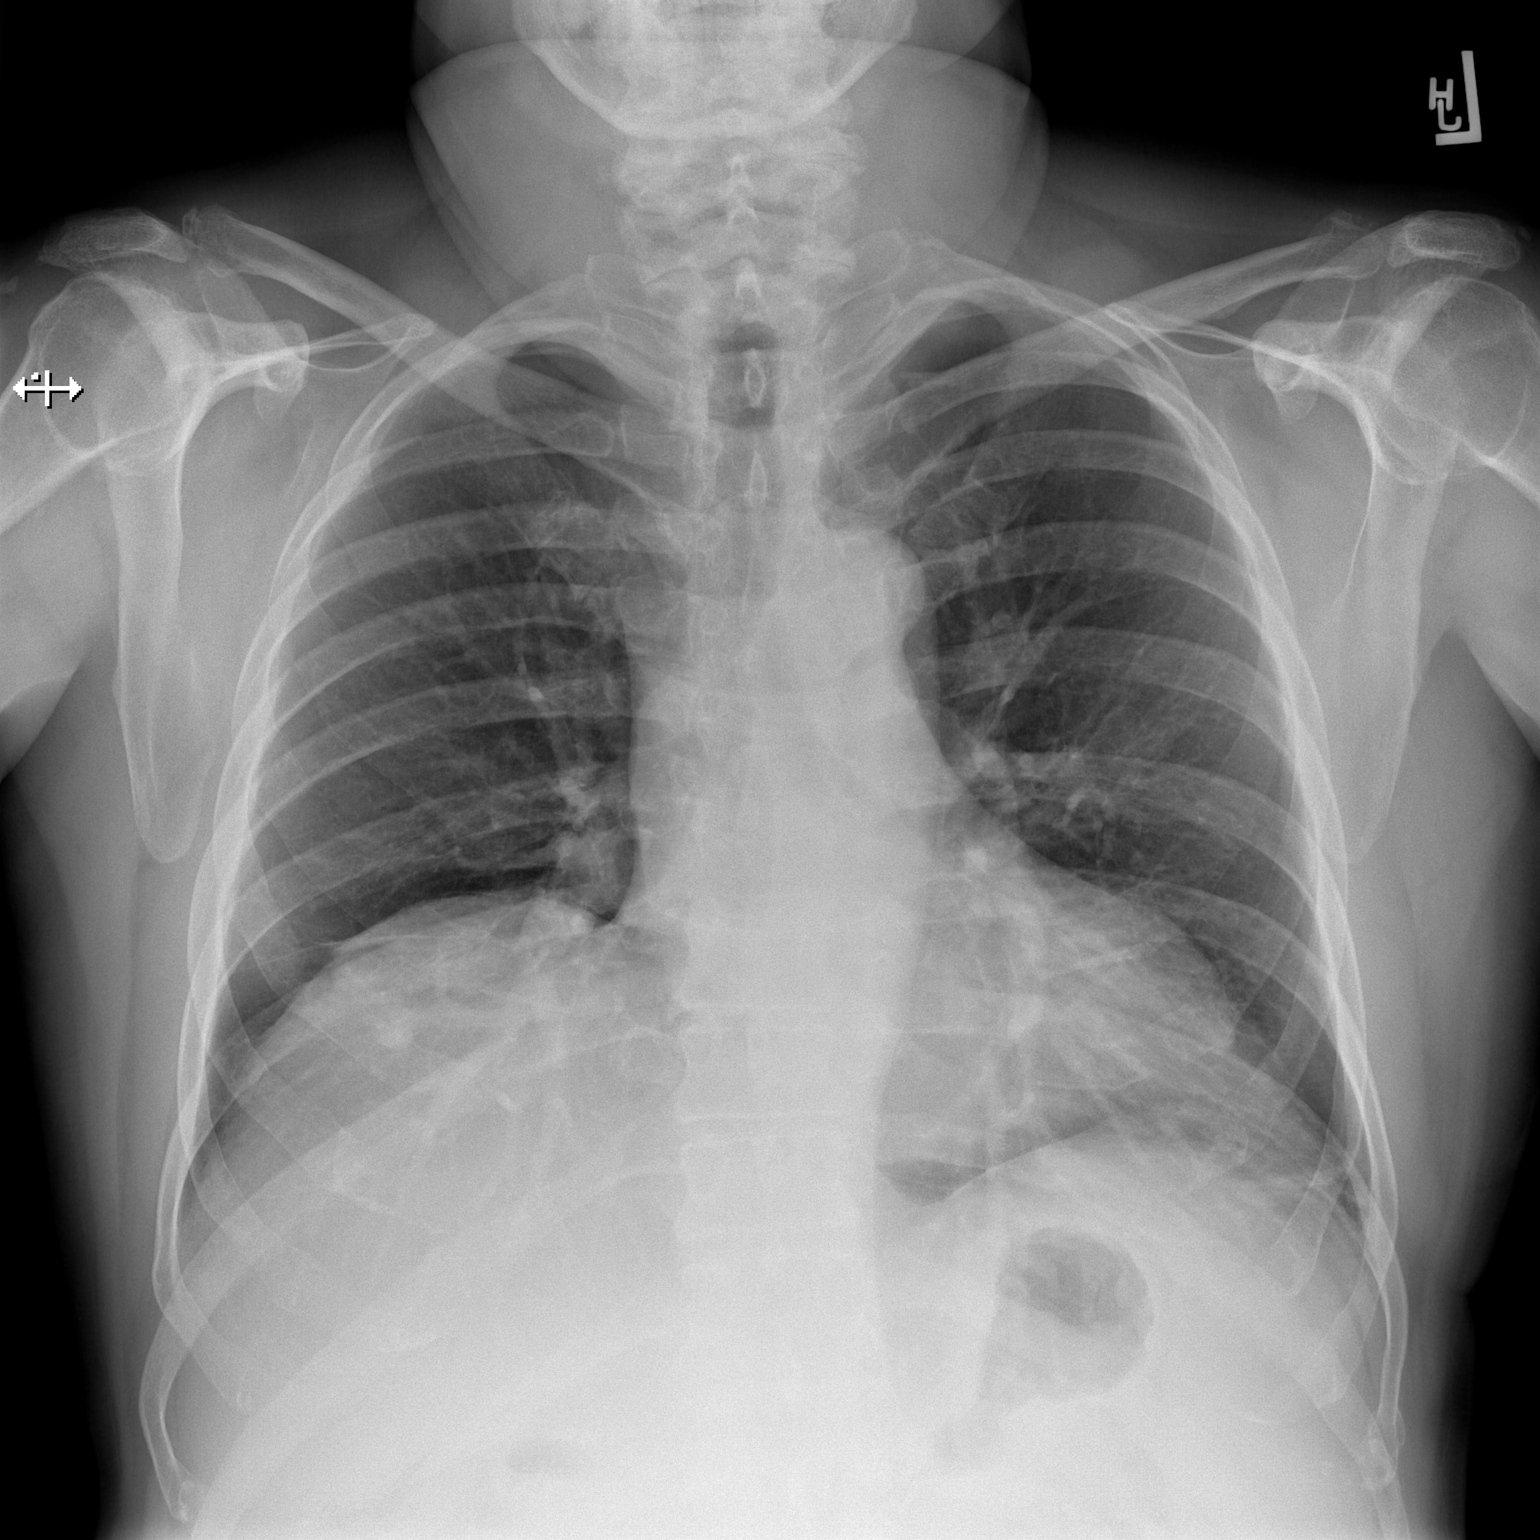

[w chest lat]
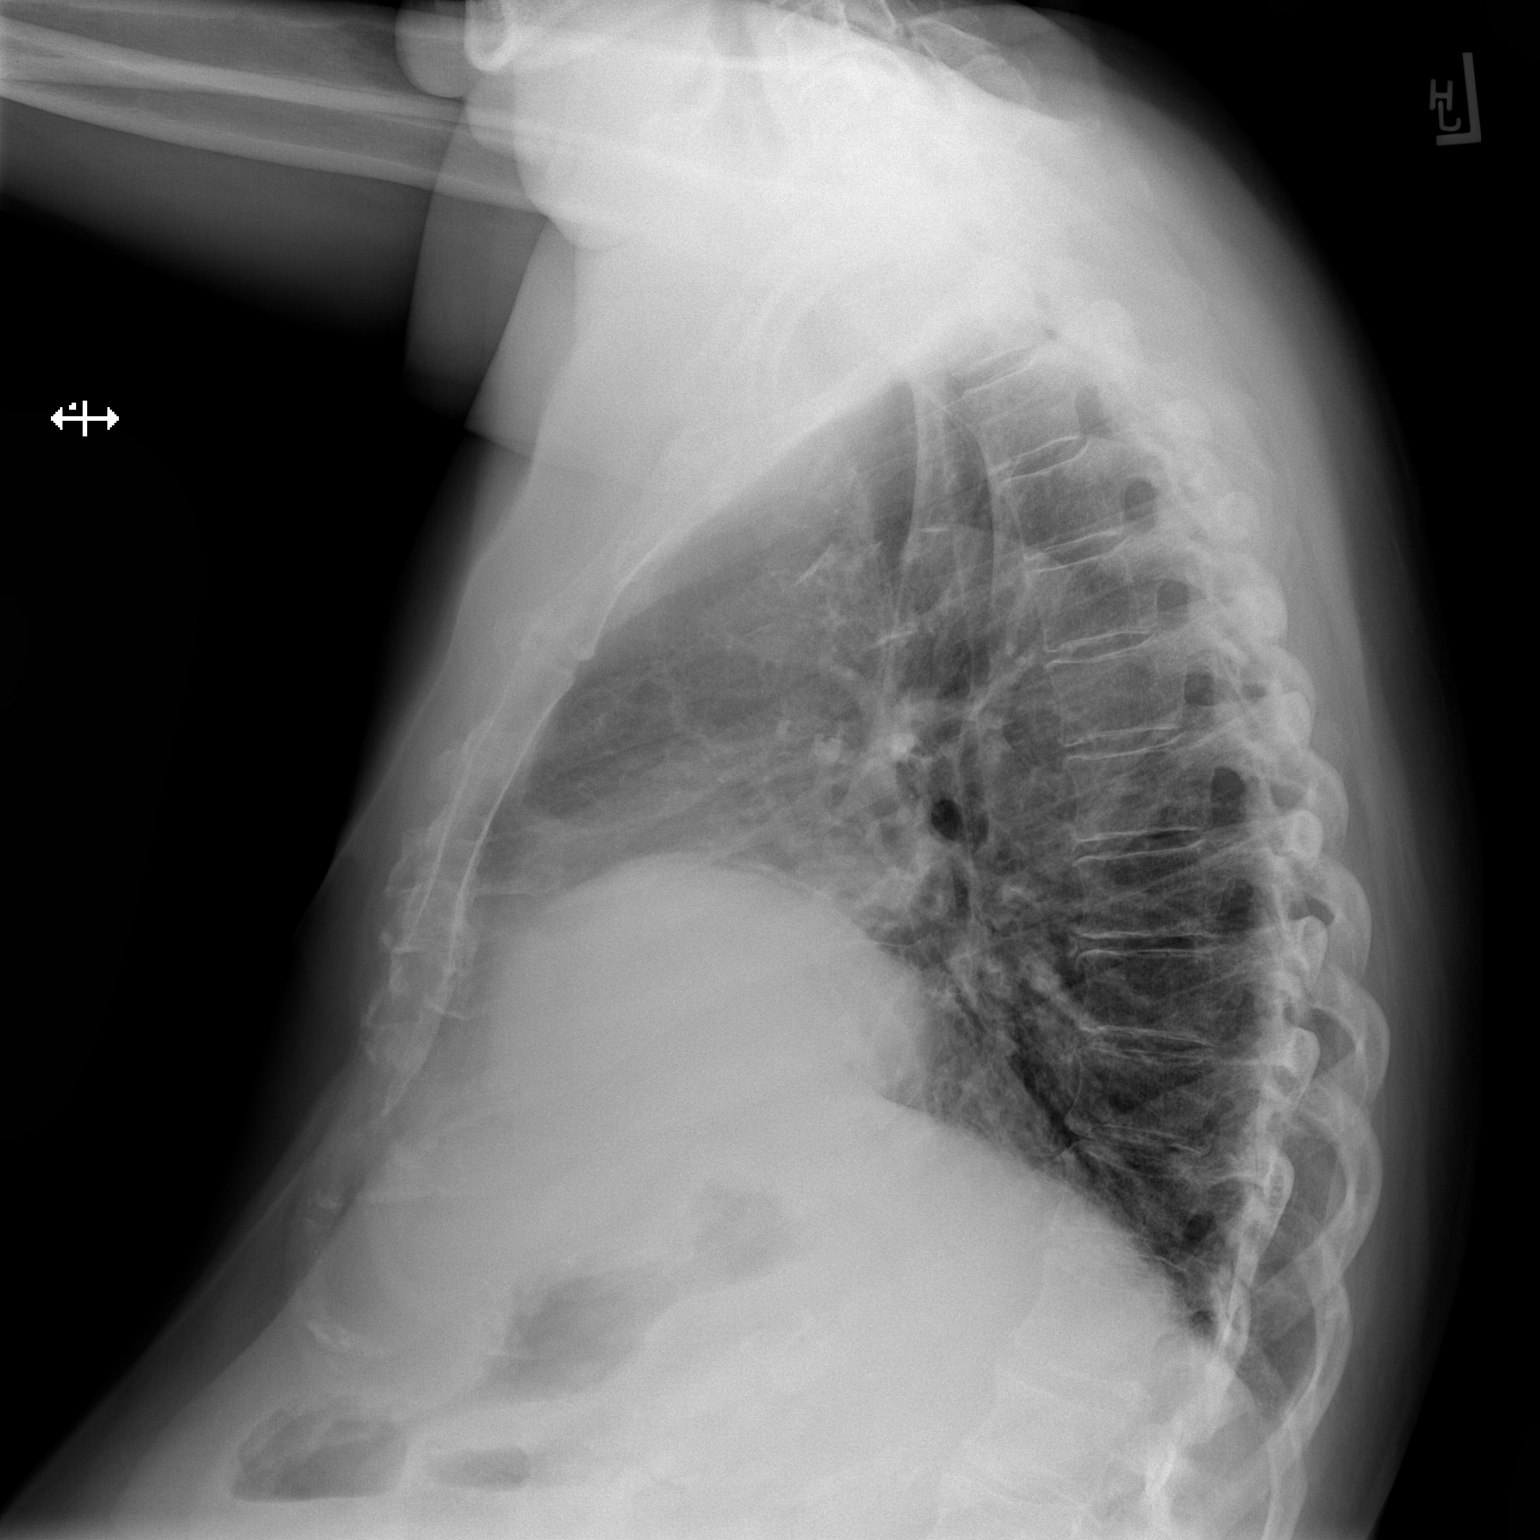

[2 of 2 positions shown; findings below may reference images not displayed]

FINDINGS: Mediastinum hilar structures normal. Stable bibasilar subsegmental
atelectasis/scarring. No acute infiltrate. No pleural effusion
pneumothorax. Stable cardiomegaly. No pulmonary venous congestion.
Degenerative change thoracic spine.
IMPRESSION: 1. Stable bibasilar subsegmental atelectasis/scarring. No acute
pulmonary abnormality.

2.  Stable cardiomegaly.  No pulmonary venous congestion.

## 2021-07-04 IMAGING — CR DG LUMBAR SPINE 1V
1 series · 1 of 1 positions shown · non-contrast
Comparison: Radiographs dated 08/13/2019 and lumbar MRI dated
07/25/2019

CLINICAL DATA: Multilevel severe spinal stenosis.

EXAM:
LUMBAR SPINE - 1 VIEW

[lateral]
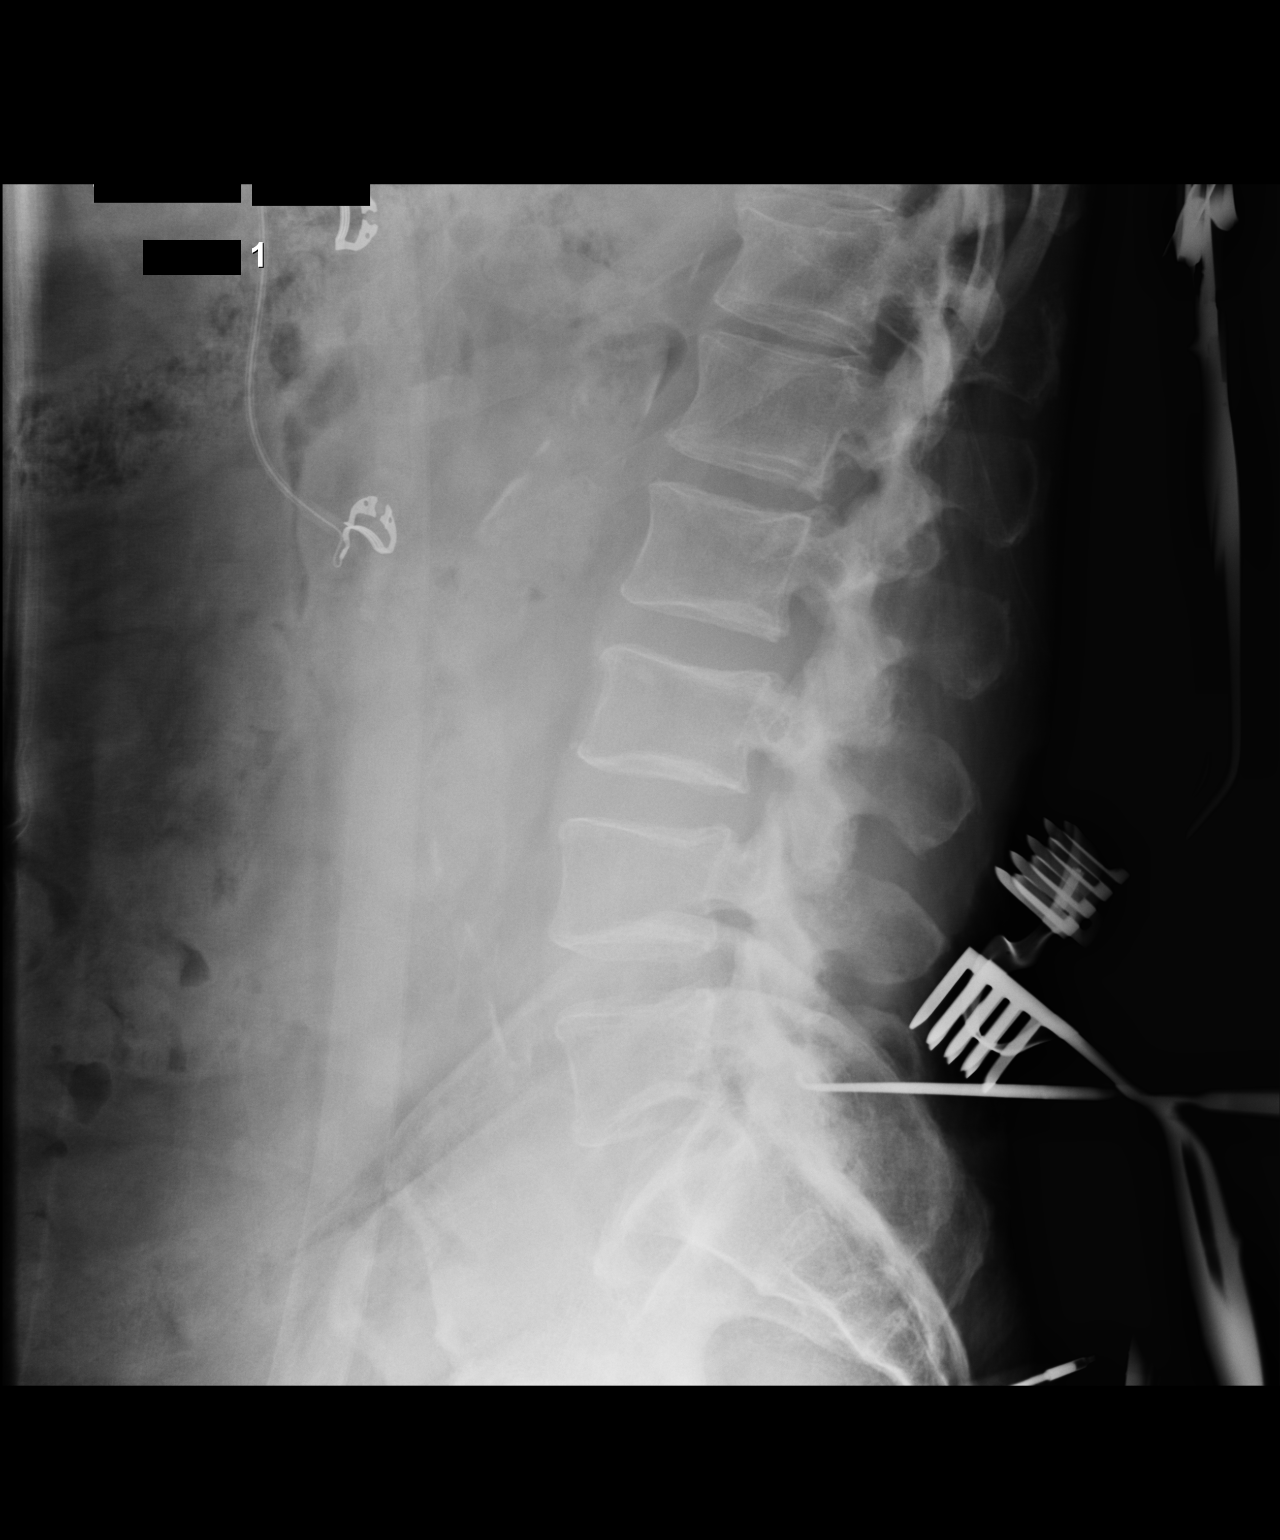

[1 of 1 positions shown; findings below may reference images not displayed]

FINDINGS: Lateral image #1 of the lumbar spine demonstrates an instrument at
the L5-S1 level.
IMPRESSION: Instrument at L5-S1.

## 2021-07-09 ENCOUNTER — Other Ambulatory Visit: Payer: Self-pay

## 2021-07-09 ENCOUNTER — Ambulatory Visit (INDEPENDENT_AMBULATORY_CARE_PROVIDER_SITE_OTHER): Payer: PPO | Admitting: Family Medicine

## 2021-07-09 ENCOUNTER — Other Ambulatory Visit: Payer: Self-pay | Admitting: Family Medicine

## 2021-07-09 ENCOUNTER — Encounter: Payer: Self-pay | Admitting: Family Medicine

## 2021-07-09 DIAGNOSIS — E1159 Type 2 diabetes mellitus with other circulatory complications: Secondary | ICD-10-CM

## 2021-07-09 DIAGNOSIS — I152 Hypertension secondary to endocrine disorders: Secondary | ICD-10-CM

## 2021-07-09 DIAGNOSIS — R2681 Unsteadiness on feet: Secondary | ICD-10-CM | POA: Diagnosis not present

## 2021-07-09 NOTE — Assessment & Plan Note (Signed)
Has fairly significant hip flexor weakness on physical exam which is likely main contributor.  Unsure if this is a sequela of his postoperative cauda equina syndrome from lumbar decompression a couple years ago, though his neurologic exam is otherwise intact and has good strength in the rest of his lower extremities.  Deconditioning also likely playing a role.  We discussed nextsteps.  We will refer to sports medicine for further evaluation and management.  Would likely benefit from a course of physical therapy as well.

## 2021-07-09 NOTE — Assessment & Plan Note (Signed)
At goal on amlodipine 5 mg daily and Metroprolol tartrate 100 mg twice daily.

## 2021-07-09 NOTE — Progress Notes (Signed)
   Jerry York is a 70 y.o. male who presents today for an office visit.  Assessment/Plan:  Chronic Problems Addressed Today: Unstable gait Has fairly significant hip flexor weakness on physical exam which is likely main contributor.  Unsure if this is a sequela of his postoperative cauda equina syndrome from lumbar decompression a couple years ago, though his neurologic exam is otherwise intact and has good strength in the rest of his lower extremities.  Deconditioning also likely playing a role.  We discussed nextsteps.  We will refer to sports medicine for further evaluation and management.  Would likely benefit from a course of physical therapy as well.  Hypertension associated with diabetes (HCC) At goal on amlodipine 5 mg daily and Metroprolol tartrate 100 mg twice daily.     Subjective:  HPI:  Patient here with difficulty walking for the last couple of years.  About 2 years ago underwent routine lumbar laminectomy due to degenerative disc disease.  This was complicated by postoperative hematoma in the lumbar area of his dural sac resulting in cauda equina syndrome.  He underwent evacuation emergently by neurosurgery which was followed by inpatient rehab.  He was then discharged to home to continue physical therapy.  He has had continued difficulty walking despite intensive physical therapy.    Symptoms of been stable over the last couple of years.  No numbness.  No tingling.  No pain.  He has to walk with his back bent over.  He is able to straighten his back if he has support but is not able to do this freely.  He has follow back up with his neurosurgeon who has told him that there is nothing visible on his testing that would explain his difficulty with walking.  He does have a large abdominal bulge that he thinks is contributing as well.  Recently saw surgery for this within the last year and was told that they were nothing that could be done for this.       Objective:   Physical Exam: BP 136/71   Pulse 62   Temp 98 F (36.7 C) (Temporal)   Ht 5\' 5"  (1.651 m)   Wt 173 lb 6.4 oz (78.7 kg)   SpO2 96%   BMI 28.86 kg/m   Gen: No acute distress, resting comfortably CV: Regular rate and rhythm with no murmurs appreciated Pulm: Normal work of breathing, clear to auscultation bilaterally with no crackles, wheezes, or rhonchi MSK: -Gait: Stooping gait noted small steps. - Back: No deformities.  Nontender to palpation - Lower extremities: No deformities.  Hip flexors 4 out of 5 bilaterally.  Knee extension/flexion 5 out of 5, ankle plantarflexion/dorsiflexion 5 out of 5.  Sensation to light touch intact throughout. Neuro: Grossly normal, moves all extremities Psych: Normal affect and thought content      Kymoni Monday M. , MD 07/09/2021 8:48 AM

## 2021-07-09 NOTE — Patient Instructions (Signed)
It was very nice to see you today!  You have a lot of weakness in your hip flexors.  I think this is causing your issue with walking.  I will place a referral to sports medicine for them to take a closer look.  Take care, Dr [name]  PLEASE NOTE:  If you had any lab tests please let us know if you have not heard back within a few days. You may see your results on mychart before we have a chance to review them but we will give you a call once they are reviewed by [name]. If we ordered any referrals today, please let us know if you have not heard from their office within the next week.   Please try these tips to maintain a healthy lifestyle:  Eat at least 3 REAL meals and 1-2 snacks per day.  Aim for no more than 5 hours between eating.  If you eat breakfast, please do so within one hour of getting up.   Each meal should contain half fruits/vegetables, one quarter protein, and one quarter carbs (no bigger than a computer mouse)  Cut down on sweet beverages. This includes juice, soda, and sweet tea.   Drink at least 1 glass of water with each meal and aim for at least 8 glasses per day  Exercise at least 150 minutes every week.

## 2021-07-13 ENCOUNTER — Other Ambulatory Visit: Payer: Self-pay | Admitting: Family Medicine

## 2021-07-20 ENCOUNTER — Telehealth: Payer: Self-pay | Admitting: Pharmacist

## 2021-07-20 NOTE — Chronic Care Management (AMB) (Addendum)
Chronic Care Management Pharmacy Assistant   Name: Jerry York  MRN: [id number] DOB: Jul 03, 1951   Reason for Encounter: Medication Coordination Call    Recent office visits:  07/09/2021 [facility] PCP [name], MD; referral to sports medicine for weak hip flexors, no medication changes indicated.  Recent consult visits:  None  Hospital visits:  None in previous 6 months  Medications: Outpatient Encounter Medications as of 07/20/2021  Medication Sig Note   acetaminophen (TYLENOL) 325 MG tablet 1 capsule as needed    allopurinol (ZYLOPRIM) 100 MG tablet Take 3 tablets by mouth once daily    amLODipine (NORVASC) 5 MG tablet Take 1 tablet by mouth once daily    atorvastatin (LIPITOR) 40 MG tablet Take 1 tablet (40 mg total) by mouth daily.    benzonatate (TESSALON PERLES) 100 MG capsule Take 1 capsule (100 mg total) by mouth 3 (three) times daily as needed. (Patient not taking: Reported on 07/09/2021)    [name] Take 500 mg by mouth 2 (two) times daily.     clonazePAM (KLONOPIN) 0.5 MG tablet Take 1 tablet (0.5 mg total) by mouth 2 (two) times daily.    colchicine 0.6 MG tablet TAKE 1 TABLET BY MOUTH ONCE DAILY. TAKE 2 TABLETS DAILY IF EXPERIENCING A FLARE.    Continuous Blood Gluc Sensor ([name] 14 DAY SENSOR) MISC USE AS DIRECTED    Continuous Blood Gluc Sensor ([name] 14 DAY SENSOR) MISC APPLY 1 KIT TOPICALLY EVERY 14 (FOURTEEN) DAYS    glucose monitoring kit ([name]) monitoring kit 1 each by Does not apply route as needed for other. Check blood sugar at home up to 3 times daily    HUMULIN R U-500 KWIKPEN 500 UNIT/ML kwikpen Inject 60-80 Units as directed See admin instructions. INJECT 80 UNITS BEFORE BREAKFAST 60 UNITS BEFORE LUNCH AND 60 UNITS BEFORE EVENING MEAL. [place] 09/20/2019: Pt states he will not be taking this unless [employer] agrees to pay for it. Dr. Buddy Duty submitted request 1-2 weeks ago   metoprolol tartrate (LOPRESSOR) 100 MG  tablet Take 1 tablet by mouth twice daily    torsemide (DEMADEX) 20 MG tablet Take 2 tablets (40 mg total) by mouth 2 (two) times daily.    traZODone (DESYREL) 100 MG tablet TAKE 2 TABLETS BY MOUTH EVERY DAY AT BEDTIME AS NEEDED FOR SLEEP    No facility-administered encounter medications on file as of 07/20/2021.   Reviewed chart for medication changes ahead of medication coordination call.   BP Readings from Last 3 Encounters:  07/09/21 136/71  04/23/21 (!) 102/59  02/04/21 122/69    Lab Results  Component Value Date   HGBA1C 2,505,397 01/25/2021     Patient obtains medications through Adherence Packaging  90 Days   Last adherence delivery included:  Atorvastatin 40 mg; one at breakfast Metoprolol 100 mg; one at breakfast Torsemide 20 mg; two at breakfast and two with evening meal Amlodipine 5 mg; one at breakfast   Patient is due for next adherence delivery on: 07/29/2021. Called patient and reviewed medications and coordinated delivery.  This delivery to include: Clonazepam 0.5 mg; one at breakfast, one at evening meal Trazodone 100 mg; two at bedtime Atorvastatin 40 mg; one at breakfast Metoprolol 100 mg; one at breakfast, on at evening meal Torsemide 20 mg; two at breakfast and two with evening meal Amlodipine 5 mg; one at breakfast   Confirmed delivery date of 07/29/2021, advised patient that pharmacy will contact them the  morning of delivery.  Star Rating Drugs: Atorvastatin 40 mg last filled 05/04/2021 90 DS  April D Calhoun, [place] Pharmacist Assistant [phone]

## 2021-07-23 ENCOUNTER — Telehealth: Payer: Self-pay | Admitting: Pharmacist

## 2021-07-23 NOTE — Chronic Care Management (AMB) (Signed)
    Chronic Care Management Pharmacy Assistant   Name: Jerry York  MRN: [id number] DOB: 1951-02-21   Reason for Encounter: Chart Review    Recent office visits:  None  Recent consult visits:  None  Hospital visits:  None in previous 6 months  Medications: Outpatient Encounter Medications as of 07/23/2021  Medication Sig Note   acetaminophen (TYLENOL) 325 MG tablet 1 capsule as needed    allopurinol (ZYLOPRIM) 100 MG tablet Take 3 tablets by mouth once daily    amLODipine (NORVASC) 5 MG tablet Take 1 tablet by mouth once daily    atorvastatin (LIPITOR) 40 MG tablet Take 1 tablet (40 mg total) by mouth daily.    benzonatate (TESSALON PERLES) 100 MG capsule Take 1 capsule (100 mg total) by mouth 3 (three) times daily as needed. (Patient not taking: Reported on 07/09/2021)    [name] Take 500 mg by mouth 2 (two) times daily.     clonazePAM (KLONOPIN) 0.5 MG tablet Take 1 tablet (0.5 mg total) by mouth 2 (two) times daily.    colchicine 0.6 MG tablet TAKE 1 TABLET BY MOUTH ONCE DAILY. TAKE 2 TABLETS DAILY IF EXPERIENCING A FLARE.    Continuous Blood Gluc Sensor ([name] 14 DAY SENSOR) MISC USE AS DIRECTED    Continuous Blood Gluc Sensor ([name] 14 DAY SENSOR) MISC APPLY 1 KIT TOPICALLY EVERY 14 (FOURTEEN) DAYS    glucose monitoring kit ([name]) monitoring kit 1 each by Does not apply route as needed for other. Check blood sugar at home up to 3 times daily    HUMULIN R U-500 KWIKPEN 500 UNIT/ML kwikpen Inject 60-80 Units as directed See admin instructions. INJECT 80 UNITS BEFORE BREAKFAST 60 UNITS BEFORE LUNCH AND 60 UNITS BEFORE EVENING MEAL. [place] 09/20/2019: Pt states he will not be taking this unless [employer] agrees to pay for it. Dr. Buddy Duty submitted request 1-2 weeks ago   metoprolol tartrate (LOPRESSOR) 100 MG tablet Take 1 tablet by mouth twice daily    torsemide (DEMADEX) 20 MG tablet Take 2 tablets (40 mg total) by mouth 2 (two) times  daily.    traZODone (DESYREL) 100 MG tablet TAKE 2 TABLETS BY MOUTH EVERY DAY AT BEDTIME AS NEEDED FOR SLEEP    No facility-administered encounter medications on file as of 07/23/2021.    Reviewed chart for medication changes.  No OVs, Consults, or hospital visits since last care coordination call/Pharmacist visit.  No medication changes indicated.  I called and spoke with the patient, he scheduled his follow up telephone call with clinical pharmacist next month on 08/16/2021 at 4:00 pm.  Future Appointments  Date Time Provider [place]  07/28/2021  1:45 PM Gregor Hams, MD [facility] None  08/05/2021  1:40 PM Ofilia Neas, PA-C [facility] None  08/16/2021  4:00 PM [facility] PHARMACIST [facility]  05/26/2022  2:30 PM [facility] [place]      April D Calhoun, [place] Pharmacist Assistant [phone]

## 2021-07-23 NOTE — Progress Notes (Signed)
Office Visit Note  Patient: Jerry York             Date of Birth: 1951-09-14           MRN: [id number]             PCP: Ardith Dark, MD Referring: Ardith Dark, MD Visit Date: 08/05/2021 Occupation: @  Subjective:  Medication monitoring   History of Present Illness: Jerry York is a 70 y.o. male with history of gout.  He is taking allopurinol 300 mg daily and colchicine 0.6 mg 1 tablet daily.   He is tolerating both medications without any side effects. He denies any recent gout flares.  He states his last flare was over 2 years ago.  He has been eating red meat about once a week but has been avoiding shellfish and alcohol.  He states that he is going to be starting physical therapy within the next several weeks to improve his strength and lower back pain.  He has been experiencing some increased stiffness and weakness in both shoulders recently.  He denies any other joint pain or joint swelling at this time.   Activities of Daily Living:  Patient reports morning stiffness for 5 minutes.   Patient Denies nocturnal pain.  Difficulty dressing/grooming: Denies Difficulty climbing stairs: Denies Difficulty getting out of chair: Denies Difficulty using hands for taps, buttons, cutlery, and/or writing: Denies  Review of Systems  Constitutional:  Negative for fatigue.  HENT:  Negative for mouth sores, mouth dryness and nose dryness.   Eyes:  Negative for pain, itching and dryness.  Respiratory:  Positive for shortness of breath. Negative for difficulty breathing.   Cardiovascular:  Negative for chest pain and palpitations.  Gastrointestinal:  Negative for blood in stool, constipation and diarrhea.  Endocrine: Negative for increased urination.  Genitourinary:  Negative for difficulty urinating.  Musculoskeletal:  Positive for joint pain, joint pain and morning stiffness. Negative for joint swelling, myalgias, muscle tenderness and myalgias.  Skin:  Negative for  color change, rash and redness.  Allergic/Immunologic: Negative for susceptible to infections.  Neurological:  Negative for dizziness, numbness, headaches, memory loss and weakness.  Hematological:  Positive for bruising/bleeding tendency.  Psychiatric/Behavioral:  Negative for confusion.    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   Type 2 diabetes mellitus (HCC) 08/09/2017   Gout 08/09/2017   Anxiety with insomnia 08/09/2017   Hyperlipidemia associated with type 2 diabetes mellitus (HCC) 08/08/2017   Hypertension associated with diabetes (HCC) 08/08/2017    Past Medical History:  Diagnosis Date   Anxiety    Bilateral renal cysts    Gout    02-22-2017 acute right foot gout---  per pt resolved   Gout    Gross hematuria    Heart murmur    History of BPH 08/14/2017   Hydronephrosis, left    Hyperlipidemia    Hypertension    Renal insufficiency    Rheumatoid arthritis (HCC)    Swelling    Type 2 diabetes mellitus (HCC)    Urinary retention    Wears glasses  Family History  Problem Relation Age of Onset   Congestive Heart Failure Mother        Related to valve disease.  Opted to treat medically   Hypertension Mother    Hyperlipidemia Mother    Diabetes Mother    Hypertension Father    Hyperlipidemia Father    Coronary artery disease Father 75       History of CABG   Stroke Father    Hypertension Brother    Diabetes Brother    Hypertension Brother    Hypertension Brother    Hypertension Daughter    Hypertension Son    Past Surgical History:  Procedure Laterality Date   APPENDECTOMY   age 87   CYSTOSCOPY WITH URETEROSCOPY AND STENT PLACEMENT Left 05/08/2017   Procedure: URETEROSCOPY AND STENT PLACEMENT;  Surgeon: Ihor Gully, MD;  Location: [facility];  Service: Urology;  Laterality: Left;   CYSTOSCOPY/RETROGRADE/URETEROSCOPY Bilateral 05/08/2017   Procedure: CYSTOSCOPY/ BILATERAL RETROGRADE;  Surgeon: Ihor Gully, MD;  Location: [facility];  Service: Urology;  Laterality: Bilateral;   HEMATOMA EVACUATION N/A 09/20/2019   Procedure: EVACUATION LUMBAR EPIDURAL HEMATOMA;  Surgeon: Tressie Stalker, MD;  Location: [facility] OR;  Service: Neurosurgery;  Laterality: N/A;  EVACUATION LUMBAR EPIDURAL HEMATOMA   INGUINAL HERNIA REPAIR Right 10/10/2000   KNEE ARTHROSCOPY Right 1980's   LUMBAR LAMINECTOMY/DECOMPRESSION MICRODISCECTOMY N/A 09/16/2019   Procedure: Laminectomy and Foraminotomy - Lumbar Two-Lumbar Three - Lumbar Three-Lumbar Four - Lumbar Four-Lumbar Five - Lumbar Five-Sacral One;  Surgeon: Tia Alert, MD;  Location: [facility] OR;  Service: Neurosurgery;  Laterality: N/A;  Laminectomy and Foraminotomy - Lumbar Two-Lumbar Three - Lumbar Three-Lumbar Four - Lumbar Four-Lumbar Five - Lumbar Five-Sacral One   NASAL FRACTURE SURGERY     in highschool    SHOULDER ARTHROSCOPY WITH OPEN ROTATOR CUFF REPAIR Right 1990's   TRANSURETHRAL RESECTION OF PROSTATE     Social History   Social History Narrative   Not on file   Immunization History  Administered Date(s) Administered   Fluad Quad(high Dose 65+) 10/07/2019, 10/22/2020   Influenza Split 10/12/2018   Influenza, High Dose Seasonal PF 10/12/2018   Influenza,inj,Quad PF,6+ Mos 09/26/2017   Influenza-Unspecified 09/26/2017   PFIZER(Purple Top)SARS-COV-2 Vaccination 01/20/2020, 02/10/2020, 10/05/2020, 06/05/2021   Pneumococcal Conjugate-13 11/10/2017   Pneumococcal Polysaccharide-23 12/30/2013   Tdap 11/02/2018     Objective: Vital Signs: BP 110/62 (BP Location: Left Arm, Patient Position: Sitting,  Cuff Size: Normal)   Pulse (!) 57   Ht 5\' 7"  (1.702 m)   Wt 172 lb (78 kg)   BMI 26.94 kg/m    Physical Exam Vitals and nursing note reviewed.  Constitutional:      Appearance: He is well-developed.  HENT:     Head: Normocephalic and atraumatic.  Eyes:     Conjunctiva/sclera: Conjunctivae normal.     Pupils: Pupils are equal, round, and reactive to light.  Pulmonary:     Effort: Pulmonary effort is normal.  Abdominal:     Palpations: Abdomen is soft.  Musculoskeletal:     Cervical back: Normal range of motion and neck supple.  Skin:    General: Skin is warm and dry.     Capillary Refill: Capillary refill takes less than 2 seconds.  Neurological:     Mental Status: He is alert and oriented to person, place, and time.  Psychiatric:        Behavior: Behavior normal.     Musculoskeletal Exam: C-spine is slightly limited range  of motion with lateral rotation.  Shoulder joints have painful range of motion and stiffness bilaterally.  Elbow joints have good range of motion with no tenderness or discomfort.  Wrist joints, MCPs, PIPs, DIPs have good range of motion with no synovitis.  PIP and DIP thickening consistent with osteoarthritis of both hands noted.  Complete fist formation bilaterally.  Right hip had slightly limited range of motion with some discomfort and stiffness.  Right knee joint has limited extension but no warmth or effusion was noted.  Left knee joint had good range of motion with no discomfort.  Ankle joints have good range of motion with no tenderness or joint swelling.  CDAI Exam: CDAI Score: -- Patient Global: --; Provider Global: -- Swollen: --; Tender: -- Joint Exam 08/05/2021   No joint exam has been documented for this visit   There is currently no information documented on the homunculus. Go to the Rheumatology activity and complete the homunculus joint exam.  Investigation: No additional findings.  Imaging: DG Lumbar Spine 2-3 Views  Result Date:  07/30/2021 CLINICAL DATA:  Back pain, gait abnormality EXAM: LUMBAR SPINE - 2-3 VIEW COMPARISON:  MRI 09/20/2019 FINDINGS: Normal lumbar lordosis. No acute fracture or listhesis of the lumbar spine. Bilateral laminectomy and resection of the spinous process of L3 and L4 has been performed, unchanged from prior examination. Vertebral body height and intervertebral disc heights are preserved. Paraspinal soft tissues are unremarkable. Vascular calcifications are seen within the aortoiliac vasculature. IMPRESSION: Status post L3-4 posterior decompression. No acute fracture or listhesis. Electronically Signed   By: Helyn Numbers MD   On: 07/30/2021 03:39    Recent Labs: Lab Results  Component Value Date   WBC 5.5 02/04/2021   HGB 15.8 02/04/2021   PLT 123 (L) 02/04/2021   NA 142 02/04/2021   K 4.0 02/04/2021   CL 103 02/04/2021   CO2 31 (A) 07/27/2021   GLUCOSE 223 (H) 02/04/2021   BUN 44 (H) 02/04/2021   CREATININE 1.81 (H) 02/04/2021   BILITOT 0.6 02/04/2021   ALKPHOS 76 04/22/2020   AST 29 07/27/2021   ALT 39 07/27/2021   PROT 6.4 02/04/2021   ALBUMIN 3.8 07/27/2021   CALCIUM 9.0 07/27/2021   GFRAA 44 (L) 02/04/2021    Speciality Comments: No specialty comments available.  Procedures:  No procedures performed Allergies: Prednisone, Sulfa antibiotics, Azithromycin, Empagliflozin, Erythromycin, Hydrocodone-acetaminophen, Liraglutide, and Metformin and related   Assessment / Plan:     Visit Diagnoses: Idiopathic chronic gout of multiple sites without tophus - He has not had any signs or symptoms of a gout flare.  He has clinically been doing well taking allopurinol 300 mg by mouth daily and colchicine 0.6 mg 1 tablet by mouth daily.  According to the patient he has not had a gout flare in over 2 years.  He eats red meat about once a week but has not had any shellfish or alcohol.  We discussed the importance of avoiding purine rich foods and alcohol use.  His uric acid was 5.1 on 02/04/2021  which is within the desirable range.  We will recheck uric acid level today.  He will continue taking allopurinol and colchicine as prescribed.  He was advised to notify us if he develops signs or symptoms of a flare.  He will follow-up in the office in 6 months. - Plan: Uric acid  Hyperuricemia -His uric acid was 5.1 on 02/04/2021 which was within the desirable range.  He has been taking allopurinol as  prescribed.  We will recheck uric acid level today.  Plan: Uric acid  Medication management -PCP recently ordered hepatic function panel and CMP on 07/27/2021.  Results were reviewed today in the office.  Uric acid level will be checked today.  Plan: Uric acid  Primary osteoarthritis of right knee: He has slightly limited extension.  No warmth or effusion noted.  Primary osteoarthritis of right hip: He has slightly limited ROM with no discomfort at this time.   DDD (degenerative disc disease), lumbar: Status post decompression and fusion on 09/19/2021 performed by Dr. Yetta Barre.  Chronic pain.  He will be starting physical therapy soon.  S/P lumbar laminectomy: He will be starting physical therapy soon.  Essential hypertension: Blood pressure 110/62 today in the office.  Hyperlipidemia associated with type 2 diabetes mellitus (HCC)  Heart murmur  History of diabetes mellitus  History of BPH  History of anxiety  Orders: Orders Placed This Encounter  Procedures   Uric acid   No orders of the defined types were placed in this encounter.     Follow-Up Instructions: Return in about 6 months (around 02/05/2022) for Gout.   Gearldine Bienenstock, PA-C  Note - This record has been created using Dragon software.  Chart creation errors have been sought, but may not always  have been located. Such creation errors do not reflect on  the standard of medical care.

## 2021-07-26 ENCOUNTER — Telehealth: Payer: Self-pay

## 2021-07-26 ENCOUNTER — Other Ambulatory Visit: Payer: Self-pay

## 2021-07-26 MED ORDER — TORSEMIDE 20 MG PO TABS: 40.0000 mg | ORAL_TABLET | Freq: Two times a day (BID) | ORAL | 3 refills | Status: AC

## 2021-07-26 NOTE — Telephone Encounter (Signed)
Refill has been sent.  °

## 2021-07-26 NOTE — Telephone Encounter (Signed)
-----   Message from Erroll Luna, [facility] sent at 07/26/2021  1:59 PM EDT ----- Patient has requested a refill on his torsemide for 90 days to be sent to [facility] for upcoming delivery.  Thanks!

## 2021-07-27 DIAGNOSIS — E1165 Type 2 diabetes mellitus with hyperglycemia: Secondary | ICD-10-CM | POA: Diagnosis not present

## 2021-07-27 DIAGNOSIS — E119 Type 2 diabetes mellitus without complications: Secondary | ICD-10-CM | POA: Diagnosis not present

## 2021-07-27 DIAGNOSIS — E785 Hyperlipidemia, unspecified: Secondary | ICD-10-CM | POA: Diagnosis not present

## 2021-07-27 DIAGNOSIS — Z794 Long term (current) use of insulin: Secondary | ICD-10-CM | POA: Diagnosis not present

## 2021-07-27 LAB — BASIC METABOLIC PANEL: CO2: 31 — AB (ref 13–22)

## 2021-07-27 LAB — LIPID PANEL
Cholesterol: 117 (ref 0–200)
HDL: 24 — AB (ref 35–70)
LDL Cholesterol: 45
LDl/HDL Ratio: 5
Triglycerides: 321 — AB (ref 40–160)

## 2021-07-27 LAB — COMPREHENSIVE METABOLIC PANEL
Albumin: 3.8 (ref 3.5–5.0)
Calcium: 9 (ref 8.7–10.7)

## 2021-07-27 LAB — HEPATIC FUNCTION PANEL
ALT: 39 (ref 10–40)
AST: 29 (ref 14–40)
Bilirubin, Total: 0.6

## 2021-07-27 LAB — HEMOGLOBIN A1C: Hemoglobin A1C: 6.7

## 2021-07-27 LAB — MICROALBUMIN, URINE: Microalb, Ur: 4.25

## 2021-07-27 NOTE — Progress Notes (Signed)
I, Jerry York, LAT, ATC acting as a scribe for Jerry Graham, MD.  Subjective:    I'm seeing this patient as a consultation for: Jerry York. Note will be routed back to referring provider/PCP.  CC: Unstable gait  HPI: Pt is a 70 y/o male presenting w/ an unsteady gait. Pt's PCP reported fairly significant hip flexor weakness and a hx of L-spine decompression a few years ago. 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  He had extensive trial of physical therapy and I think [name] physical therapy over the course of a year following the surgery.  He has had trials of epidural steroid injections following the surgery as well.  Since the surgery he has had difficulty fully extending his back especially walking and walks with a forward flexed hunched over position.  He reports having extensive repeat evaluations and treatment trials at [facility] without much benefit recently.  This is also complicated by abdominal wall weakness following an abdominal hernia surgery.  He has had extensive evaluation for this as well.  He notes especially stands his abdomen becomes very distended which also limits his ability to extend his back fully and walk with an upright position.    Dx imaging: 09/20/19 L-spine MRI  09/16/19 L-spine XR   Past medical history, Surgical history, Family history, Social history, Allergies, and medications have been entered into the medical record, reviewed.   Review of Systems: No new headache, visual changes, nausea, vomiting, diarrhea, constipation, dizziness, abdominal pain, skin rash, fevers, chills, night sweats, weight loss, swollen lymph nodes, body aches, joint swelling, muscle aches, chest pain, shortness of breath, mood changes, visual or auditory hallucinations.   Objective:   Heart rate 80 bpm General: Well Developed, well  nourished, and in no acute distress.  Neuro/Psych: Alert and oriented x3, extra-ocular muscles intact, able to move all 4 extremities, sensation grossly intact. Skin: Warm and dry, no rashes noted.  Respiratory: Not using accessory muscles, speaking in full sentences, trachea midline.  Cardiovascular: Pulses palpable, no extremity edema. Abdomen: Nontender.  Distended with standing. MSK: L-spine nontender midline.  Significantly reduced lumbar range of motion especially to extension.  With gait patient walks with a forward flexed hunched over position.    Lab and Radiology Results  X-ray images L-spine obtained today personally and independently interpreted Preserved alignment.  Postsurgical changes.  No retained surgical hardware.  No acute fractures. Await formal radiology review  Impression and Recommendations:    Assessment and Plan: 70 y.o. male with significant gait difficulty.  Patient has what seems to be significant postural weakness secondary to postsurgical changes around his lumbar spine and thought to be related to abdominal wall weakness.  He is already had what I would consider to be the typical conservative management trials for this physical therapy and some epidural steroid injection trials with little benefit.  I think it is worthwhile redoing some of these treatments.  Plan to repeat x-ray today and refer back to PT for repeat trial of physical therapy.  I cautioned Jerry York that it is unlikely he is going to get completely better but he may have some moderate benefit with physical therapy again.  I think this would be a great goal.  Additionally recommend using his rolling walker with ambulation. Obtain medical records and check back in 6 weeks.  Likely next step would be MRI lumbar spine.  PDMP  not reviewed this encounter. Orders Placed This Encounter  Procedures   DG Lumbar Spine 2-3 Views    Standing Status:   Future    Number of Occurrences:   1    Standing Expiration  Date:   07/28/2022    Order Specific Question:   Reason for Exam (SYMPTOM  OR DIAGNOSIS REQUIRED)    Answer:   eval lumbar spine    Order Specific Question:   Preferred imaging location?    Answer:   [name]   Ambulatory referral to Physical Therapy    Referral Priority:   Routine    Referral Type:   Physical Medicine    Referral Reason:   Specialty Services Required    Requested Specialty:   Physical Therapy    Number of Visits Requested:   1   No orders of the defined types were placed in this encounter.   Discussed warning signs or symptoms. Please see discharge instructions. Patient expresses understanding.   The above documentation has been reviewed and is accurate and complete Jerry York, M.D.

## 2021-07-28 ENCOUNTER — Other Ambulatory Visit: Payer: Self-pay

## 2021-07-28 ENCOUNTER — Ambulatory Visit (INDEPENDENT_AMBULATORY_CARE_PROVIDER_SITE_OTHER): Payer: PPO | Admitting: Family Medicine

## 2021-07-28 ENCOUNTER — Ambulatory Visit (INDEPENDENT_AMBULATORY_CARE_PROVIDER_SITE_OTHER): Payer: PPO

## 2021-07-28 VITALS — Ht 65.0 in | Wt 175.0 lb

## 2021-07-28 DIAGNOSIS — R269 Unspecified abnormalities of gait and mobility: Secondary | ICD-10-CM | POA: Diagnosis not present

## 2021-07-28 DIAGNOSIS — R2681 Unsteadiness on feet: Secondary | ICD-10-CM | POA: Diagnosis not present

## 2021-07-28 DIAGNOSIS — M545 Low back pain, unspecified: Secondary | ICD-10-CM | POA: Diagnosis not present

## 2021-07-28 NOTE — Patient Instructions (Signed)
Thank you for coming in today.  Please get an Xray today before you leave  I've referred you to Physical Therapy.  Let us know if you don't hear from them in one week.  I will request records.    Recheck in 6 weeks.

## 2021-07-30 NOTE — Progress Notes (Signed)
X-ray lumbar spine shows evidence of prior surgery but otherwise looks unchanged from prior x-rays.

## 2021-08-04 ENCOUNTER — Encounter: Payer: Self-pay | Admitting: Family Medicine

## 2021-08-05 ENCOUNTER — Encounter: Payer: Self-pay | Admitting: Physician Assistant

## 2021-08-05 ENCOUNTER — Telehealth: Payer: Self-pay

## 2021-08-05 ENCOUNTER — Other Ambulatory Visit: Payer: Self-pay

## 2021-08-05 ENCOUNTER — Ambulatory Visit: Payer: PPO | Admitting: Physician Assistant

## 2021-08-05 VITALS — BP 110/62 | HR 57 | Ht 67.0 in | Wt 172.0 lb

## 2021-08-05 DIAGNOSIS — Z79899 Other long term (current) drug therapy: Secondary | ICD-10-CM | POA: Diagnosis not present

## 2021-08-05 DIAGNOSIS — M5136 Other intervertebral disc degeneration, lumbar region: Secondary | ICD-10-CM | POA: Diagnosis not present

## 2021-08-05 DIAGNOSIS — M1A09X0 Idiopathic chronic gout, multiple sites, without tophus (tophi): Secondary | ICD-10-CM | POA: Diagnosis not present

## 2021-08-05 DIAGNOSIS — R011 Cardiac murmur, unspecified: Secondary | ICD-10-CM

## 2021-08-05 DIAGNOSIS — Z8639 Personal history of other endocrine, nutritional and metabolic disease: Secondary | ICD-10-CM | POA: Diagnosis not present

## 2021-08-05 DIAGNOSIS — E1169 Type 2 diabetes mellitus with other specified complication: Secondary | ICD-10-CM

## 2021-08-05 DIAGNOSIS — Z9889 Other specified postprocedural states: Secondary | ICD-10-CM

## 2021-08-05 DIAGNOSIS — E790 Hyperuricemia without signs of inflammatory arthritis and tophaceous disease: Secondary | ICD-10-CM

## 2021-08-05 DIAGNOSIS — Z87438 Personal history of other diseases of male genital organs: Secondary | ICD-10-CM

## 2021-08-05 DIAGNOSIS — Z8659 Personal history of other mental and behavioral disorders: Secondary | ICD-10-CM

## 2021-08-05 DIAGNOSIS — M1711 Unilateral primary osteoarthritis, right knee: Secondary | ICD-10-CM

## 2021-08-05 DIAGNOSIS — I10 Essential (primary) hypertension: Secondary | ICD-10-CM

## 2021-08-05 DIAGNOSIS — M1611 Unilateral primary osteoarthritis, right hip: Secondary | ICD-10-CM | POA: Diagnosis not present

## 2021-08-05 DIAGNOSIS — E785 Hyperlipidemia, unspecified: Secondary | ICD-10-CM

## 2021-08-05 NOTE — Telephone Encounter (Signed)
Patient is enrolled into [employer] grant through 10/26/21 and is not eligible for renewal at this time per portal.  BIN: [id number] ID: [id number] PCN: [id number] Group: [id number]  Chesley Mires, PharmD, MPH, BCPS Clinical Pharmacist (Rheumatology and Pulmonology)

## 2021-08-05 NOTE — Telephone Encounter (Signed)
Patient is in the office today for an appointment and is questioning grant renewal for allopurinol and colchicine. Please call patient to advise. Thanks!

## 2021-08-05 NOTE — Telephone Encounter (Signed)
Attempted to contact patient and left message on machine to advise patient that Patient is enrolled into [employer] grant through 10/26/21 and is not eligible for renewal at this time per portal. Advised patient to call the office closer to renewal date.

## 2021-08-06 LAB — URIC ACID: Uric Acid, Serum: 5.3 mg/dL (ref 4.0–8.0)

## 2021-08-06 NOTE — Progress Notes (Signed)
Uric acid is WNL.  No change in therapy recommended.

## 2021-08-10 ENCOUNTER — Telehealth: Payer: Self-pay | Admitting: Family Medicine

## 2021-08-10 NOTE — Telephone Encounter (Signed)
Received medical records from [facility] surgery.  Patient had assessment for bulging abdominal wall on October 14, 2020 by Dr. Gerrit Friends.  At that time he thought more likely to be rectus diastases but significant asymmetry.  Plan for CT scan of the abdomen and pelvis.  Basic idea was no sign of fascial defect or true hernia.  The concern was there is loss of muscle abdominal tone on the right side the abdomen resulting in this asymmetry.  Note will be sent to scan.

## 2021-08-14 ENCOUNTER — Other Ambulatory Visit: Payer: Self-pay | Admitting: Family Medicine

## 2021-08-16 ENCOUNTER — Ambulatory Visit (INDEPENDENT_AMBULATORY_CARE_PROVIDER_SITE_OTHER): Payer: PPO | Admitting: Pharmacist

## 2021-08-16 DIAGNOSIS — E1159 Type 2 diabetes mellitus with other circulatory complications: Secondary | ICD-10-CM | POA: Diagnosis not present

## 2021-08-16 DIAGNOSIS — E1169 Type 2 diabetes mellitus with other specified complication: Secondary | ICD-10-CM | POA: Diagnosis not present

## 2021-08-16 DIAGNOSIS — E785 Hyperlipidemia, unspecified: Secondary | ICD-10-CM | POA: Diagnosis not present

## 2021-08-16 DIAGNOSIS — N183 Chronic kidney disease, stage 3 unspecified: Secondary | ICD-10-CM

## 2021-08-16 DIAGNOSIS — Z794 Long term (current) use of insulin: Secondary | ICD-10-CM

## 2021-08-16 DIAGNOSIS — I152 Hypertension secondary to endocrine disorders: Secondary | ICD-10-CM

## 2021-08-16 DIAGNOSIS — E1122 Type 2 diabetes mellitus with diabetic chronic kidney disease: Secondary | ICD-10-CM

## 2021-08-16 NOTE — Progress Notes (Signed)
Chronic Care Management Pharmacy Note  08/25/2021 Name:  Jerry York MRN:  [id number] DOB:  1951-07-29  Summary: PharmD follow up for med review and disease management.  Recommendations/Changes made from today's visit: Patient needs diabetic foot exam Vaccine records show he is due for PPSV 23  Plan: FU 6 months   Subjective: Jerry York is an 70 y.o. year old male who is a primary patient of [name], MD.  The CCM team was consulted for assistance with disease management and care coordination needs.    Engaged with patient by telephone for follow up visit in response to provider referral for pharmacy case management and/or care coordination services.   Consent to Services:  The patient was given the following information about Chronic Care Management services today, agreed to services, and gave verbal consent: 1. CCM service includes personalized support from designated clinical staff supervised by the primary care provider, including individualized plan of care and coordination with other care providers 2. 24/7 contact phone numbers for assistance for urgent and routine care needs. 3. Service will only be billed when office clinical staff spend 20 minutes or more in a month to coordinate care. 4. Only one practitioner may furnish and bill the service in a calendar month. 5.The patient may stop CCM services at any time (effective at the end of the month) by phone call to the office staff. 6. The patient will be responsible for cost sharing (co-pay) of up to 20% of the service fee (after annual deductible is met). Patient agreed to services and consent obtained.  Patient Care Team: [name], MD as PCP - General (Family Medicine) Kerry Fort., MD (Urology) Delrae Rend, MD as Consulting Physician (Endocrinology) Gardiner Barefoot, DPM as Consulting Physician (Podiatry) Madelin Rear, [facility] [place] as Pharmacist (Pharmacist)  Recent office visits: None since last CCM  call  Recent consult visits: 08/05/21 Quita Skye, rheumatology) - no changes to medication 07/28/21 Georgina Snell, Sports medicine) - X-ray ordered.  Hospital visits: None in previous 6 months   Objective:  Lab Results  Component Value Date   CREATININE 1.81 (H) 02/04/2021   BUN 44 (H) 02/04/2021   GFR 44.79 (L) 04/22/2020   GFRNONAA 38 (L) 02/04/2021   GFRAA 44 (L) 02/04/2021   NA 142 02/04/2021   K 4.0 02/04/2021   CALCIUM 9.0 07/27/2021   CO2 31 (A) 07/27/2021   GLUCOSE 223 (H) 02/04/2021    Lab Results  Component Value Date/Time   HGBA1C 6.7 07/27/2021 12:00 AM   HGBA1C 8,756,433 01/25/2021 12:00 AM   GFR 44.79 (L) 04/22/2020 09:42 AM   GFR 48.53 (L) 11/21/2018 11:02 AM   MICROALBUR 4.25 07/27/2021 12:00 AM   MICROALBUR 4.9 (H) 04/22/2020 09:43 AM    Last diabetic Eye exam:  Lab Results  Component Value Date/Time   HMDIABEYEEXA Retinopathy (A) 05/23/2017 12:00 AM    Last diabetic Foot exam: No results found for: HMDIABFOOTEX   Lab Results  Component Value Date   CHOL 117 07/27/2021   HDL 24 (A) 07/27/2021   LDLCALC 45 07/27/2021   LDLDIRECT 37.0 04/22/2020   TRIG 321 (A) 07/27/2021   CHOLHDL 7 04/22/2020    Hepatic Function Latest Ref Rng & Units 07/27/2021 02/04/2021 08/04/2020  Total Protein 6.1 - 8.1 g/dL - 6.4 6.2  Albumin 3.5 - 5.0 3.8 - -  AST 14 - 40 29 36(H) 31  ALT 10 - 40 39 57(H) 37  Alk Phosphatase 39 - 117 U/L - - -  Total Bilirubin 0.2 - 1.2 mg/dL - 0.6 0.6  Bilirubin, Direct 0.0 - 0.3 mg/dL - - -    Lab Results  Component Value Date/Time   TSH 4.48 04/22/2020 09:42 AM   TSH 3.370 01/28/2019 02:13 PM   FREET4 1.09 01/28/2019 02:13 PM    CBC Latest Ref Rng & Units 02/04/2021 08/04/2020 04/22/2020  WBC 3.8 - 10.8 Thousand/uL 5.5 4.5 5.2  Hemoglobin 13.2 - 17.1 g/dL 15.8 14.9 15.1  Hematocrit 38.5 - 50.0 % 44.6 42.8 44.0  Platelets 140 - 400 Thousand/uL 123(L) 129(L) 115.0(L)    Lab Results  Component Value Date/Time   VD25OH CANCELED  08/29/2019 11:05 AM   VD25OH CANCELED 01/28/2019 02:13 PM    Clinical ASCVD: No  The ASCVD Risk score Mikey Bussing DC Jr., et al., 2013) failed to calculate for the following reasons:   The valid total cholesterol range is 130 to 320 mg/dL    Depression screen [facility] 2/9 07/09/2021 05/20/2021 04/23/2021  Decreased Interest 0 0 0  Down, Depressed, Hopeless 0 0 0  PHQ - 2 Score 0 0 0  Altered sleeping - - -  Tired, decreased energy - - -  Change in appetite - - -  Feeling bad or failure about yourself  - - -  Trouble concentrating - - -  Moving slowly or fidgety/restless - - -  Suicidal thoughts - - -  PHQ-9 Score - - -  Difficult doing work/chores - - -  Some recent data might be hidden     Social History   Tobacco Use  Smoking Status Never  Smokeless Tobacco Never   BP Readings from Last 3 Encounters:  08/05/21 110/62  07/09/21 136/71  04/23/21 (!) 102/59   Pulse Readings from Last 3 Encounters:  08/05/21 (!) 57  07/09/21 62  04/23/21 (!) 54   Wt Readings from Last 3 Encounters:  08/05/21 172 lb (78 kg)  07/28/21 175 lb (79.4 kg)  07/09/21 173 lb 6.4 oz (78.7 kg)   BMI Readings from Last 3 Encounters:  08/05/21 26.94 kg/m  07/28/21 29.12 kg/m  07/09/21 28.86 kg/m    Assessment/Interventions: Review of patient past medical history, allergies, medications, health status, including review of consultants reports, laboratory and other test data, was performed as part of comprehensive evaluation and provision of chronic care management services.   SDOH:  (Social Determinants of Health) assessments and interventions performed: Yes  Financial Resource Strain: Low Risk    Difficulty of Paying Living Expenses: Not hard at all    SDOH Screenings   Alcohol Screen: Not on file  Depression (PHQ2-9): Low Risk    PHQ-2 Score: 0  Financial Resource Strain: Low Risk    Difficulty of Paying Living Expenses: Not hard at all  Food Insecurity: No Food Insecurity   Worried About  Charity fundraiser in the Last Year: Never true   Ran Out of Food in the Last Year: Never true  Housing: Low Risk    Last Housing Risk Score: 0  Physical Activity: Insufficiently Active   Days of Exercise per Week: 3 days   Minutes of Exercise per Session: 30 min  Social Connections: Moderately Isolated   Frequency of Communication with Friends and Family: Once a week   Frequency of Social Gatherings with Friends and Family: Never   Attends Religious Services: 1 to 4 times per year   Active Member of [employer] or Organizations: No   Attends Archivist Meetings: Never   Marital Status:  Married  Stress: No Stress Concern Present   Feeling of Stress : Not at all  Tobacco Use: Low Risk    Smoking Tobacco Use: Never   Smokeless Tobacco Use: Never  Transportation Needs: No Transportation Needs   Lack of Transportation (Medical): No   Lack of Transportation (Non-Medical): No    CCM Care Plan  Allergies  Allergen Reactions   Prednisone Other (See Comments)    Elevated blood sugar   Sulfa Antibiotics Other (See Comments)    "make my feet burn"   Azithromycin Nausea And Vomiting   Empagliflozin Other (See Comments)   Erythromycin    Liraglutide Other (See Comments)   Metformin And Related Diarrhea    Medications Reviewed Today     Reviewed by Edythe Clarity, [facility] (Pharmacist) on 08/25/21 at 1610  Med List Status: <None>   Medication Order Taking? Sig Documenting Provider Last Dose Status Informant  acetaminophen (TYLENOL) 325 MG tablet [id number]  1 capsule as needed [provider]  Active   allopurinol (ZYLOPRIM) 100 MG tablet [id number] Yes Take 3 tablets by mouth once daily [name], MD Taking Active   amLODipine (NORVASC) 5 MG tablet [id number] Yes Take 1 tablet by mouth once daily [name], MD Taking Active   atorvastatin (LIPITOR) 40 MG tablet [id number] Yes Take 1 tablet (40 mg total) by mouth daily. [name], MD Taking Active    benzonatate (TESSALON PERLES) 100 MG capsule [id number]  Take 1 capsule (100 mg total) by mouth 3 (three) times daily as needed.  Patient not taking: Reported on 07/09/2021   Lucretia Kern, DO  Active   [name] [id number] Yes Take 500 mg by mouth 2 (two) times daily.  [provider] Taking Active Self  clonazePAM (KLONOPIN) 0.5 MG tablet [id number] Yes Take 1 tablet (0.5 mg total) by mouth 2 (two) times daily. [name], MD Taking Active   colchicine 0.6 MG tablet [id number] Yes TAKE 1 TABLET BY MOUTH ONCE DAILY. TAKE 2 TABLETS DAILY IF EXPERIENCING A FLARE [name], MD Taking Active   Continuous Blood Gluc Sensor ([name] 14 [place]) [place] [id number] Yes USE AS DIRECTED [name], MD Taking Active   Continuous Blood Gluc Sensor ([name] 14 DAY SENSOR) [place] [id number] Yes APPLY 1 KIT TOPICALLY EVERY 14 (FOURTEEN) DAYS [name], MD Taking Active   glucose monitoring kit ([name]) monitoring kit [id number] Yes 1 each by Does not apply route as needed for other. Check blood sugar at home up to 3 times daily [name], MD Taking Active   HUMULIN R U-500 KWIKPEN 500 UNIT/ML Claiborne Rigg [id number] Yes Inject 60-80 Units as directed See admin instructions. INJECT 80 UNITS BEFORE BREAKFAST 60 UNITS BEFORE LUNCH AND 60 UNITS BEFORE EVENING MEAL. [place] [provider] Taking Active Self           Med Note (ATKINS, HEATHER L   Fri Sep 20, 2019  4:00 PM) Pt states he will not be taking this unless [employer] agrees to pay for it. Dr. Buddy Duty submitted request 1-2 weeks ago  metoprolol tartrate (LOPRESSOR) 100 MG tablet [id number] Yes Take 1 tablet by mouth twice daily [name], MD Taking Active   torsemide (DEMADEX) 20 MG tablet [id number] Yes Take 2 tablets (40 mg total) by mouth 2 (two) times daily. 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   Type 2 diabetes mellitus ([place]) 08/09/2017   Gout 08/09/2017   Anxiety with insomnia 08/09/2017   Hyperlipidemia associated with type 2 diabetes mellitus ([place]) 08/08/2017   Hypertension associated with diabetes ([place]) 08/08/2017    Immunization History  Administered Date(s) Administered   Fluad Quad(high Dose 65+) 10/07/2019, 10/22/2020   Influenza Split 10/12/2018   Influenza, High Dose Seasonal PF 10/12/2018   Influenza,inj,Quad PF,6+ Mos 09/26/2017   Influenza-Unspecified 09/26/2017   PFIZER(Purple Top)SARS-COV-2 Vaccination 01/20/2020, 02/10/2020, 10/05/2020, 06/05/2021   Pneumococcal Conjugate-13 11/10/2017   Pneumococcal Polysaccharide-23 12/30/2013   Tdap 11/02/2018    Conditions to be addressed/monitored:  HTN, DM, HLD, Anxiety/Insomnia, Gout  Care Plan : General Pharmacy (Adult)  Updates made by Edythe Clarity, RPH since 08/25/2021 12:00 AM     Problem: HTN, HLD, DM   Priority: High  Onset Date: 08/25/2021     Long-Range Goal: Patient-Specific Goal   Start Date: 08/25/2021  Expected End Date: 02/22/2022  This Visit's Progress: On track  Priority: High  Note:   Current Barriers:  Unpredictable lows with glucose  Pharmacist Clinical Goal(s):  Patient will maintain control of glucose and BP as evidenced by monitoring  through collaboration with PharmD and provider.   Interventions: 1:1 collaboration with [name], MD regarding development and update of comprehensive plan of care as  evidenced by provider attestation and co-signature Inter-disciplinary care team collaboration (see longitudinal plan of care) Comprehensive medication review performed; medication list updated in electronic medical record  Hypertension (BP goal <140/90) -Controlled -Current treatment: Metoprolol tartrate 100 mg twice daily Amlodipine 5 mg once daily Torsemide 20 mg - two tablets twice daily (switched to torsemide from lasix 80 mg 10/22/2020) -Medications previously tried: lasix  -Current home readings: "WNL"  -Denies hypotensive/hypertensive symptoms -Educated on BP goals and benefits of medications for prevention of heart attack, stroke and kidney damage; Exercise goal of 150 minutes per week; Importance of home blood pressure monitoring; -Counseled to monitor BP at home periodically, document, and provide log at future appointments -Recommended to continue current medication  Hyperlipidemia: (LDL goal < 70) -Controlled -Current treatment: Atorvastatin 40 mg once daily -Medications previously tried: none noted  -Most recent lipid panel is excellent -Educated on Cholesterol goals;  Benefits of statin for ASCVD risk reduction; Importance of limiting foods high in cholesterol; -Recommended to continue current medication  Diabetes (A1c goal <7%) -Controlled based on August A1c -Current medications: Humulin R U-500 Kwikpen 30 units bid -Medications previously tried: metformin, Januvia, Amaryl, Prandin  -Current home glucose readings fasting glucose: 140-145 qam post prandial glucose: N/A -Denies hypoglycemic/hyperglycemic symptoms -Still does have unpredictable lows - corrects with fast acting carbs/sugars -Educated on A1c and blood sugar goals; Prevention and management of hypoglycemic episodes; Counseled to check feet daily and get yearly eye exams -Counseled to check feet daily and get yearly eye exams -Recommended to continue current medication   Patient  Goals/Self-Care Activities Patient will:  -  take medications as prescribed check glucose daily, document, and provide at future appointments check blood pressure daily, document, and provide at future appointments Schedule diabetic foot exam  Follow Up Plan: The care management team will reach out to the patient again over the next 0 days.        Medication Assistance:  Humulin obtained through [facility] medication assistance program.  Enrollment ends 12/25/21  Compliance/Adherence/Medication fill history: Care Gaps: [place] Foot exam   Patient's preferred pharmacy is:  [facility] [place] [address] Phone: [phone] Fax: [phone]  Uses pill box? Yes Pt endorses 100% compliance  We discussed: Benefits of medication synchronization, packaging and delivery as well as enhanced pharmacist oversight with [employer]. Patient decided to: Continue current medication management strategy  Care Plan and Follow Up Patient Decision:  Patient agrees to Care Plan and Follow-up.  Plan: The care management team will reach out to the patient again over the next 180 days.  Beverly Milch, PharmD Clinical Pharmacist [phone]

## 2021-08-19 ENCOUNTER — Ambulatory Visit: Payer: PPO | Admitting: Physical Therapy

## 2021-08-19 ENCOUNTER — Other Ambulatory Visit: Payer: Self-pay

## 2021-08-19 ENCOUNTER — Encounter: Payer: Self-pay | Admitting: Physical Therapy

## 2021-08-19 DIAGNOSIS — R293 Abnormal posture: Secondary | ICD-10-CM | POA: Diagnosis not present

## 2021-08-19 DIAGNOSIS — R2689 Other abnormalities of gait and mobility: Secondary | ICD-10-CM | POA: Diagnosis not present

## 2021-08-22 ENCOUNTER — Encounter: Payer: Self-pay | Admitting: Physical Therapy

## 2021-08-22 NOTE — Therapy (Signed)
[facility]  [facility] [address] [place], [place], [address] Phone: [phone]   Fax:  [phone]  Physical Therapy Evaluation  Patient Details  Name: Jerry York MRN: [id number] Date of Birth: 10-09-1951 Referring Provider (PT): [name]   Encounter Date: 08/19/2021   PT End of Session - 08/22/21 1837     Visit Number 1    Number of Visits 16    Date for PT Re-Evaluation 10/14/21    Authorization Type HTA    PT Start Time 1300    PT Stop Time 1344    PT Time Calculation (min) 44 min    Activity Tolerance Patient tolerated treatment well    Behavior During Therapy [facility] for tasks assessed/performed             Past Medical History:  Diagnosis Date   Anxiety    Bilateral renal cysts    Gout    02-22-2017 acute right foot gout---  per pt resolved   Gout    Gross hematuria    Heart murmur    History of BPH 08/14/2017   Hydronephrosis, left    Hyperlipidemia    Hypertension    Renal insufficiency    Rheumatoid arthritis (HCC)    Swelling    Type 2 diabetes mellitus (HCC)    Urinary retention    Wears glasses     Past Surgical History:  Procedure Laterality Date   APPENDECTOMY  age 63   CYSTOSCOPY WITH URETEROSCOPY AND STENT PLACEMENT Left 05/08/2017   Procedure: URETEROSCOPY AND STENT PLACEMENT;  Surgeon: Ihor Gully, MD;  Location[facility];  Service: Urology;  Laterality: Left;   CYSTOSCOPY/RETROGRADE/URETEROSCOPY Bilateral 05/08/2017   Procedure: CYSTOSCOPY/ BILATERAL RETROGRADE;  Surgeon: Ihor Gully, MD;  Location: [facility];  Service: Urology;  Laterality: Bilateral;   HEMATOMA EVACUATION N/A 09/20/2019   Procedure: EVACUATION LUMBAR EPIDURAL HEMATOMA;  Surgeon: Tressie Stalker, MD;  Location: [facility] OR;  Service: Neurosurgery;  Laterality: N/A;  EVACUATION LUMBAR EPIDURAL HEMATOMA   INGUINAL HERNIA REPAIR Right 10/10/2000   KNEE ARTHROSCOPY Right 1980's   LUMBAR  LAMINECTOMY/DECOMPRESSION MICRODISCECTOMY N/A 09/16/2019   Procedure: Laminectomy and Foraminotomy - Lumbar Two-Lumbar Three - Lumbar Three-Lumbar Four - Lumbar Four-Lumbar Five - Lumbar Five-Sacral One;  Surgeon: Tia Alert, MD;  Location: [facility] OR;  Service: Neurosurgery;  Laterality: N/A;  Laminectomy and Foraminotomy - Lumbar Two-Lumbar Three - Lumbar Three-Lumbar Four - Lumbar Four-Lumbar Five - Lumbar Five-Sacral One   NASAL FRACTURE SURGERY     in highschool    SHOULDER ARTHROSCOPY WITH OPEN ROTATOR CUFF REPAIR Right 1990's   TRANSURETHRAL RESECTION OF PROSTATE      There were no vitals filed for this visit.        [facility] PT Assessment - 08/22/21 0001       Assessment   Medical Diagnosis Posture, gait    Referring Provider (PT) [name]    Prior Therapy yes, over a year ago      Precautions   Precautions None      Balance Screen   Has the patient fallen in the past 6 months No      Prior Function   Level of Independence Independent      Cognition   Overall Cognitive Status Within Functional Limits for tasks assessed      Posture/Postural Control   Posture Comments Severe stooped posture in standing, unble to stand upright      ROM / Strength  AROM / PROM / Strength Strength;AROM      AROM   Overall AROM Comments Hips: WFL, Knees: WFL, UEs: WFL;      Strength   Overall Strength Comments Hips: 4 to 4+/5 bilaterally, core:      Palpation   Palpation comment Pt with  diastisis, about 1-2 inches in rectus, but also appears to have some herniation of abdominal muscles more laterally, seen with abominal contraction in supine.                        Objective measurements completed on examination: See above findings.       [facility] Adult PT Treatment/Exercise - 08/22/21 0001       Self-Care   Self-Care Posture    Posture education on optimal seated posture      Exercises   Exercises Lumbar      Lumbar Exercises: Seated   Other Seated  Lumbar Exercises seated: scap squeeze x 15;  Trunk stability: reaching outside of BOS L/R x 10;                    PT Education - 08/22/21 1836     Education Details PT POC, Exam findings, HEP, Discussed trial for aquatic PT.    Person(s) Educated Patient    Methods Explanation;Demonstration;Tactile cues;Handout    Comprehension Returned demonstration;Verbalized understanding;Verbal cues required;Tactile cues required;Need further instruction              PT Short Term Goals - 08/22/21 1843       PT SHORT TERM GOAL #1   Title Pt to be independent with initial HEP    Time 6    Period Weeks    Status New    Target Date 09/02/21               PT Long Term Goals - 08/22/21 1844       PT LONG TERM GOAL #1   Title Pt to be independent with final HEP    Time 8    Period Weeks    Status New    Target Date 10/14/21      PT LONG TERM GOAL #2   Title Pt to demo ability to stand with 25 deg or less lumbar flexion with standing and walking.    Time 8    Period Weeks    Status New    Target Date 10/14/21      PT LONG TERM GOAL #3   Title Pt to report ability for unsupported standing/walking at least 10 minutes to improve ability for ADLs and IADLS.    Time 8    Period Weeks    Status New    Target Date 10/14/21                    Plan - 08/22/21 1858     Clinical Impression Statement Pt presents wtih primary complaint of decreased strength and posture. Pt with severe postural changes that are limiting his functional activity. Pt with significant forward flexed posture in standing, with core weakess, and inability for upright standing.  Pt with discomfort and inability to sustain upright posture without use of RW. He has much improved posture with use of RW with ambulation, but has limited tolerance for standing and walking activity. Pt with flexible posture and is able to achieve neutral spine posture in supine. He has poor core strength and  control. He is not  currently wearing lumbar support brace, but we did discuss a light brace for attempts of improving core stability. Recommended pt bring his walker next visit to assess height and posture with use.Pt with significant muscle weakness and decreased trunk control that is effecting functional activity, posture, gait, and balance. Recomended aquatic PT at this time, for posture, trunk control, strengthening, and gait. Pt in agreement, will be transferred to another clinic to start aquatics.    Personal Factors and Comorbidities Comorbidity 1    Comorbidities lumbar surgery, hernia surgery    Examination-Activity Limitations Stairs;Stand;Lift;Locomotion Level    Examination-Participation Restrictions Cleaning;Community Activity;Yard Work;Meal Prep    Stability/Clinical Decision Making Evolving/Moderate complexity    Clinical Decision Making Moderate    Rehab Potential Good    PT Frequency 2x / week    PT Duration 8 weeks    PT Treatment/Interventions ADLs/Self Care Home Management;Canalith Repostioning;Cryotherapy;Electrical Stimulation;Iontophoresis 4mg /ml Dexamethasone;Moist Heat;Traction;Balance training;Therapeutic exercise;Therapeutic activities;Functional mobility training;Stair training;Gait training;DME Instruction;Ultrasound;Neuromuscular re-education;Patient/family education;Manual techniques;Taping;Energy conservation;Dry needling;Passive range of motion;Spinal Manipulations;Joint Manipulations    PT Home Exercise Plan [email or web address]    Consulted and Agree with Plan of Care Patient             Patient will benefit from skilled therapeutic intervention in order to improve the following deficits and impairments:  Abnormal gait, Decreased endurance, Pain, Decreased strength, Decreased activity tolerance, Decreased balance, Decreased mobility, Difficulty walking, Improper body mechanics, Postural dysfunction, Decreased range of motion  Visit Diagnosis: Abnormal posture  Other  abnormalities of gait and mobility     Problem List Patient Active Problem List   Diagnosis Date Noted   Unstable gait 07/09/2021   Chronic left shoulder pain 04/23/2021   Pain due to onychomycosis of toenails of both feet 01/21/2020   Neurogenic bladder 09/25/2019   S/P lumbar laminectomy 09/16/2019   Lower extremity edema 12/11/2017   Family history of premature CAD 11/27/2017   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 [place], [place], [address] Phone: [phone]   Fax:  [phone]  Name: Jerry York MRN: [id number] Date of Birth: [birth year]

## 2021-08-22 NOTE — Patient Instructions (Signed)
Access Code: [email or web address] URL: [email or web address] Date: 08/22/2021 Prepared by: Sedalia Muta  Exercises Seated Correct Posture Seated Upright Posture Correction - 2 min hold Seated Scapular Retraction - 3 x daily - 1 sets - 10 reps

## 2021-08-25 ENCOUNTER — Ambulatory Visit (HOSPITAL_BASED_OUTPATIENT_CLINIC_OR_DEPARTMENT_OTHER): Payer: PPO | Attending: Family Medicine | Admitting: Physical Therapy

## 2021-08-25 ENCOUNTER — Other Ambulatory Visit: Payer: Self-pay

## 2021-08-25 DIAGNOSIS — M6281 Muscle weakness (generalized): Secondary | ICD-10-CM | POA: Diagnosis not present

## 2021-08-25 DIAGNOSIS — R2689 Other abnormalities of gait and mobility: Secondary | ICD-10-CM

## 2021-08-25 DIAGNOSIS — Z1211 Encounter for screening for malignant neoplasm of colon: Secondary | ICD-10-CM

## 2021-08-25 NOTE — Patient Instructions (Addendum)
Visit Information   Goals Addressed             This Visit's Progress    Monitor and Manage My Blood Sugar-Diabetes Type 2       Timeframe:  Long-Range Goal Priority:  High Start Date:   08/25/21                          Expected End Date:   02/31/23                    Follow Up Date 11/24/21    - check blood sugar at prescribed times - check blood sugar before and after exercise - check blood sugar if I feel it is too high or too low    Why is this important?   Checking your blood sugar at home helps to keep it from getting very high or very low.  Writing the results in a diary or log helps the doctor know how to care for you.  Your blood sugar log should have the time, date and the results.  Also, write down the amount of insulin or other medicine that you take.  Other information, like what you ate, exercise done and how you were feeling, will also be helpful.     Notes:        Patient Care Plan: General Pharmacy (Adult)     Problem Identified: HTN, HLD, DM   Priority: High  Onset Date: 08/25/2021     Long-Range Goal: Patient-Specific Goal   Start Date: 08/25/2021  Expected End Date: 02/22/2022  This Visit's Progress: On track  Priority: High  Note:   Current Barriers:  Unpredictable lows with glucose  Pharmacist Clinical Goal(s):  Patient will maintain control of glucose and BP as evidenced by monitoring  through collaboration with PharmD and provider.   Interventions: 1:1 collaboration with Ardith Dark, MD regarding development and update of comprehensive plan of care as evidenced by provider attestation and co-signature Inter-disciplinary care team collaboration (see longitudinal plan of care) Comprehensive medication review performed; medication list updated in electronic medical record  Hypertension (BP goal <140/90) -Controlled -Current treatment: Metoprolol tartrate 100 mg twice daily Amlodipine 5 mg once daily Torsemide 20 mg - two tablets  twice daily (switched to torsemide from lasix 80 mg 10/22/2020) -Medications previously tried: lasix  -Current home readings: "WNL"  -Denies hypotensive/hypertensive symptoms -Educated on BP goals and benefits of medications for prevention of heart attack, stroke and kidney damage; Exercise goal of 150 minutes per week; Importance of home blood pressure monitoring; -Counseled to monitor BP at home periodically, document, and provide log at future appointments -Recommended to continue current medication  Hyperlipidemia: (LDL goal < 70) -Controlled -Current treatment: Atorvastatin 40 mg once daily -Medications previously tried: none noted  -Most recent lipid panel is excellent -Educated on Cholesterol goals;  Benefits of statin for ASCVD risk reduction; Importance of limiting foods high in cholesterol; -Recommended to continue current medication  Diabetes (A1c goal <7%) -Controlled based on August A1c -Current medications: Humulin R U-500 Kwikpen 30 units bid -Medications previously tried: metformin, Januvia, Amaryl, Prandin  -Current home glucose readings fasting glucose: 140-145 qam post prandial glucose: N/A -Denies hypoglycemic/hyperglycemic symptoms -Still does have unpredictable lows - corrects with fast acting carbs/sugars -Educated on A1c and blood sugar goals; Prevention and management of hypoglycemic episodes; Counseled to check feet daily and get yearly eye exams -Counseled to check feet daily and get yearly  eye exams -Recommended to continue current medication   Patient Goals/Self-Care Activities Patient will:  - take medications as prescribed check glucose daily, document, and provide at future appointments check blood pressure daily, document, and provide at future appointments Schedule diabetic foot exam  Follow Up Plan: The care management team will reach out to the patient again over the next 0 days.        Patient verbalizes understanding of  instructions provided today and agrees to view in MyChart.  Telephone follow up appointment with pharmacy team member scheduled for: 6 months  Erroll Luna, [place]  [phone]

## 2021-08-26 ENCOUNTER — Encounter (HOSPITAL_BASED_OUTPATIENT_CLINIC_OR_DEPARTMENT_OTHER): Payer: Self-pay | Admitting: Physical Therapy

## 2021-08-26 NOTE — Therapy (Signed)
[facility] [address] [place], [place], [address] Phone: [phone]   Fax:  [phone]  Physical Therapy Treatment  Patient Details  Name: Jerry York MRN: [id number] Date of Birth: [birth year] Referring Provider (PT): [name]   Encounter Date: 08/25/2021   PT End of Session - 08/26/21 1332     Visit Number 2    Number of Visits 16    Date for PT Re-Evaluation 10/14/21    Authorization Type HTA    PT Start Time 1345    PT Stop Time 1426    PT Time Calculation (min) 41 min    Activity Tolerance Patient tolerated treatment well    Behavior During Therapy [facility] for tasks assessed/performed             Past Medical History:  Diagnosis Date   Anxiety    Bilateral renal cysts    Gout    02-22-2017 acute right foot gout---  per pt resolved   Gout    Gross hematuria    Heart murmur    History of BPH 08/14/2017   Hydronephrosis, left    Hyperlipidemia    Hypertension    Renal insufficiency    Rheumatoid arthritis (HCC)    Swelling    Type 2 diabetes mellitus (HCC)    Urinary retention    Wears glasses     Past Surgical History:  Procedure Laterality Date   APPENDECTOMY  age 38   CYSTOSCOPY WITH URETEROSCOPY AND STENT PLACEMENT Left 05/08/2017   Procedure: URETEROSCOPY AND STENT PLACEMENT;  Surgeon: Ihor Gully, MD;  Location: [facility];  Service: Urology;  Laterality: Left;   CYSTOSCOPY/RETROGRADE/URETEROSCOPY Bilateral 05/08/2017   Procedure: CYSTOSCOPY/ BILATERAL RETROGRADE;  Surgeon: Ihor Gully, MD;  Location: [facility];  Service: Urology;  Laterality: Bilateral;   HEMATOMA EVACUATION N/A 09/20/2019   Procedure: EVACUATION LUMBAR EPIDURAL HEMATOMA;  Surgeon: Tressie Stalker, MD;  Location: [facility] OR;  Service: Neurosurgery;  Laterality: N/A;  EVACUATION LUMBAR EPIDURAL HEMATOMA   INGUINAL HERNIA REPAIR Right 10/10/2000   KNEE ARTHROSCOPY Right 1980's   LUMBAR  LAMINECTOMY/DECOMPRESSION MICRODISCECTOMY N/A 09/16/2019   Procedure: Laminectomy and Foraminotomy - Lumbar Two-Lumbar Three - Lumbar Three-Lumbar Four - Lumbar Four-Lumbar Five - Lumbar Five-Sacral One;  Surgeon: Tia Alert, MD;  [facility] OR;  Service: Neurosurgery;  Laterality: N/A;  Laminectomy and Foraminotomy - Lumbar Two-Lumbar Three - Lumbar Three-Lumbar Four - Lumbar Four-Lumbar Five - Lumbar Five-Sacral One   NASAL FRACTURE SURGERY     in highschool    SHOULDER ARTHROSCOPY WITH OPEN ROTATOR CUFF REPAIR Right 1990's   TRANSURETHRAL RESECTION OF PROSTATE      There were no vitals filed for this visit.   Subjective Assessment - 08/26/21 1326     Subjective Patient  reports no pain today. He is having difficulty standing up. He comes in today with a walker.    Pertinent History Lumbar surgery: laminectomy 2020, then complication and lumbar hematoma surgery 4 days later., hernia repair 2001.    Limitations Lifting;Standing;Walking;House hold activities    Patient Stated Goals stand up straighter, stand for longer, "walk better"    Currently in Pain? No/denies              Pt seen for aquatic therapy today.  Treatment took place in water 3.25-4 ft in depth at the [facility] pool. Temp of water was 91.  Pt entered/exited the pool via stairs (step through pattern) independently with bilat  rail.  Introduction to water. Had patient stand at different levels so she could feel the bouncy   Warm up: heel/toe walking x4 laps across pool chest deep side stepping x4 laps from shallow to deep;   Long strides with posture 2 laps ; march with posture 2 laps   Exercises; Slow march x20;  squats x20; hip extension x20; hip abduction x20; Sit to stand x20;    Seated: board trunk flexion x10 lateral board rotation x10 each way seated board press x20 seated   Noodle pull with manual resistance x20   Weight press down rainbow weights bent arm x20    Pt requires buoyancy  for support and to offload joints with strengthening exercises. Viscosity of the water is needed for resistance of strengthening; water current perturbations provides challenge to standing balance unsupported, requiring increased core activation.                          PT Education - 08/26/21 1329     Education Details reviewed HEP and symptom mangement    Person(s) Educated Patient    Methods Explanation;Demonstration;Tactile cues;Verbal cues    Comprehension Verbalized understanding;Returned demonstration;Verbal cues required;Tactile cues required              PT Short Term Goals - 08/22/21 1843       PT SHORT TERM GOAL #1   Title Pt to be independent with initial HEP    Time 6    Period Weeks    Status New    Target Date 09/02/21               PT Long Term Goals - 08/22/21 1844       PT LONG TERM GOAL #1   Title Pt to be independent with final HEP    Time 8    Period Weeks    Status New    Target Date 10/14/21      PT LONG TERM GOAL #2   Title Pt to demo ability to stand with 25 deg or less lumbar flexion with standing and walking.    Time 8    Period Weeks    Status New    Target Date 10/14/21      PT LONG TERM GOAL #3   Title Pt to report ability for unsupported standing/walking at least 10 minutes to improve ability for ADLs and IADLS.    Time 8    Period Weeks    Status New    Target Date 10/14/21                   Plan - 08/26/21 1333     Clinical Impression Statement Patient tolerated treatment well. He had no significant increase in pain. Patient focused on posterior chain activiation and core activation in the pool We reviewed the benefits of using the water for strengthening.    Personal Factors and Comorbidities Comorbidity 1    Comorbidities lumbar surgery, hernia surgery    Examination-Activity Limitations Stairs;Stand;Lift;Locomotion Level    Examination-Participation Restrictions Cleaning;Community  Activity;Yard Work;Meal Prep    Stability/Clinical Decision Making Evolving/Moderate complexity    Clinical Decision Making Moderate    Rehab Potential Good    PT Frequency 2x / week    PT Duration 8 weeks    PT Treatment/Interventions ADLs/Self Care Home Management;Canalith Repostioning;Cryotherapy;Electrical Stimulation;Iontophoresis 4mg /ml Dexamethasone;Moist Heat;Traction;Balance training;Therapeutic exercise;Therapeutic activities;Functional mobility training;Stair training;Gait training;DME Instruction;Ultrasound;Neuromuscular re-education;Patient/family education;Manual techniques;Taping;Energy conservation;Dry needling;Passive range of  motion;Spinal Manipulations;Joint Manipulations    PT Home Exercise Plan [email or web address]    Consulted and Agree with Plan of Care Patient             Patient will benefit from skilled therapeutic intervention in order to improve the following deficits and impairments:  Abnormal gait, Decreased endurance, Pain, Decreased strength, Decreased activity tolerance, Decreased balance, Decreased mobility, Difficulty walking, Improper body mechanics, Postural dysfunction, Decreased range of motion  Visit Diagnosis: Other abnormalities of gait and mobility  Muscle weakness (generalized)     Problem List Patient Active Problem List   Diagnosis Date Noted   Unstable gait 07/09/2021   Chronic left shoulder pain 04/23/2021   Pain due to onychomycosis of toenails of both feet 01/21/2020   Neurogenic bladder 09/25/2019   S/P lumbar laminectomy 09/16/2019   Lower extremity edema 12/11/2017   Family history of premature CAD 11/27/2017   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

## 2021-09-01 ENCOUNTER — Ambulatory Visit (HOSPITAL_BASED_OUTPATIENT_CLINIC_OR_DEPARTMENT_OTHER): Payer: PPO | Attending: Family Medicine | Admitting: Physical Therapy

## 2021-09-01 ENCOUNTER — Other Ambulatory Visit: Payer: Self-pay

## 2021-09-01 ENCOUNTER — Encounter (HOSPITAL_BASED_OUTPATIENT_CLINIC_OR_DEPARTMENT_OTHER): Payer: Self-pay | Admitting: Physical Therapy

## 2021-09-01 DIAGNOSIS — R2689 Other abnormalities of gait and mobility: Secondary | ICD-10-CM | POA: Diagnosis not present

## 2021-09-01 DIAGNOSIS — M6281 Muscle weakness (generalized): Secondary | ICD-10-CM | POA: Insufficient documentation

## 2021-09-01 NOTE — Therapy (Signed)
[facility] [address] [place], [place], [address] Phone: [phone]   Fax:  [phone]  Physical Therapy Treatment  Patient Details  Name: Jerry York MRN: [id number] Date of Birth: 06-Feb-1951 Referring Provider (PT): [name]   Encounter Date: 09/01/2021   PT End of Session - 09/01/21 1559     Visit Number 3    Number of Visits 16    Date for PT Re-Evaluation 10/14/21    PT Start Time 1547    PT Stop Time 1630    PT Time Calculation (min) 43 min    Equipment Utilized During Treatment Other (comment)   Ankle cuff and waist buoys, kick board, noodle/sqoodle, nekdoodle, weights and barbells, water walker   Activity Tolerance Patient tolerated treatment well    Behavior During Therapy [facility] for tasks assessed/performed             Past Medical History:  Diagnosis Date   Anxiety    Bilateral renal cysts    Gout    02-22-2017 acute right foot gout---  per pt resolved   Gout    Gross hematuria    Heart murmur    History of BPH 08/14/2017   Hydronephrosis, left    Hyperlipidemia    Hypertension    Renal insufficiency    Rheumatoid arthritis (HCC)    Swelling    Type 2 diabetes mellitus (HCC)    Urinary retention    Wears glasses     Past Surgical History:  Procedure Laterality Date   APPENDECTOMY  age 31   CYSTOSCOPY WITH URETEROSCOPY AND STENT PLACEMENT Left 05/08/2017   Procedure: URETEROSCOPY AND STENT PLACEMENT;  Surgeon: Ihor Gully, MD;  Location: [facility] [place] [facility];  Service: Urology;  Laterality: Left;   CYSTOSCOPY/RETROGRADE/URETEROSCOPY Bilateral 05/08/2017   Procedure: CYSTOSCOPY/ BILATERAL RETROGRADE;  Surgeon: Ihor Gully, MD;  Location: [facility];  Service: Urology;  Laterality: Bilateral;   HEMATOMA EVACUATION N/A 09/20/2019   Procedure: EVACUATION LUMBAR EPIDURAL HEMATOMA;  Surgeon: Tressie Stalker, MD;  Location: [facility] OR;  Service: Neurosurgery;  Laterality: N/A;   EVACUATION LUMBAR EPIDURAL HEMATOMA   INGUINAL HERNIA REPAIR Right 10/10/2000   KNEE ARTHROSCOPY Right 1980's   LUMBAR LAMINECTOMY/DECOMPRESSION MICRODISCECTOMY N/A 09/16/2019   Procedure: Laminectomy and Foraminotomy - Lumbar Two-Lumbar Three - Lumbar Three-Lumbar Four - Lumbar Four-Lumbar Five - Lumbar Five-Sacral One;  Surgeon: Tia Alert, MD;  Location: [facility] OR;  Service: Neurosurgery;  Laterality: N/A;  Laminectomy and Foraminotomy - Lumbar Two-Lumbar Three - Lumbar Three-Lumbar Four - Lumbar Four-Lumbar Five - Lumbar Five-Sacral One   NASAL FRACTURE SURGERY     in highschool    SHOULDER ARTHROSCOPY WITH OPEN ROTATOR CUFF REPAIR Right 1990's   TRANSURETHRAL RESECTION OF PROSTATE      There were no vitals filed for this visit.   Subjective Assessment - 09/01/21 1748     Subjective "felt ok after laset aquatic session". No concenrs    Currently in Pain? No/denies                                     Upper Extremity Functional Index Score :   /80     PT Short Term Goals - 08/22/21 1843       PT SHORT TERM GOAL #1   Title Pt to be independent with initial HEP    Time 6    Period Weeks  Status New    Target Date 09/02/21               PT Long Term Goals - 08/22/21 1844       PT LONG TERM GOAL #1   Title Pt to be independent with final HEP    Time 8    Period Weeks    Status New    Target Date 10/14/21      PT LONG TERM GOAL #2   Title Pt to demo ability to stand with 25 deg or less lumbar flexion with standing and walking.    Time 8    Period Weeks    Status New    Target Date 10/14/21      PT LONG TERM GOAL #3   Title Pt to report ability for unsupported standing/walking at least 10 minutes to improve ability for ADLs and IADLS.    Time 8    Period Weeks    Status New    Target Date 10/14/21             Pt seen for aquatic therapy today.  Treatment took place in water 3.25-4 ft in depth at the [facility]  pool. Temp of water was 91.  Pt entered/exited the pool via stairs (step through pattern) independently with bilat rail.      Warm up: heel/toe walking x4 laps across pool chest deep side stepping x4 laps from shallow to deep;    Long strides with posture 2 laps ; march with posture 2 laps    Exercises; Slow march x20;  squats x20; hip extension x20; hip abduction x20; Sit to stand x20;     Standing  board trunk flexion 2 x10   Seated lateral board rotation x10 each way seated board press x20    Prone suspension Holding position x 1 min -hip flex with cues for slow ext 2 x 5 bilat resisted by cuff buoys  Supine suspension Hip ext 2 x 10 reps resisted by cuff buoys Knee flex x 10     Pt requires buoyancy for support and to offload joints with strengthening exercises. Viscosity of the water is needed for resistance of strengthening; water current perturbations provides challenge to standing balance unsupported, requiring increased core activation.      Plan - 09/01/21 1750     Clinical Impression Statement Continued with posterior chain and core activation. Pt tolerates well. Note: poor glute strength bilat    PT Treatment/Interventions ADLs/Self Care Home Management;Canalith Repostioning;Cryotherapy;Electrical Stimulation;Iontophoresis 4mg /ml Dexamethasone;Moist Heat;Traction;Balance training;Therapeutic exercise;Therapeutic activities;Functional mobility training;Stair training;Gait training;DME Instruction;Ultrasound;Neuromuscular re-education;Patient/family education;Manual techniques;Taping;Energy conservation;Dry needling;Passive range of motion;Spinal Manipulations;Joint Manipulations    PT Next Visit Plan Progress glute strengthening    PT Home Exercise Plan [facility]             Patient will benefit from skilled therapeutic intervention in order to improve the following deficits and impairments:  Abnormal gait, Decreased endurance, Pain, Decreased strength, Decreased  activity tolerance, Decreased balance, Decreased mobility, Difficulty walking, Improper body mechanics, Postural dysfunction, Decreased range of motion  Visit Diagnosis: Other abnormalities of gait and mobility  Muscle weakness (generalized)     Problem List Patient Active Problem List   Diagnosis Date Noted   Unstable gait 07/09/2021   Chronic left shoulder pain 04/23/2021   Pain due to onychomycosis of toenails of both feet 01/21/2020   Neurogenic bladder 09/25/2019   S/P lumbar laminectomy 09/16/2019   Lower extremity edema 12/11/2017   Family history of  premature CAD 11/27/2017   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 [place], [place], [address] Phone: [phone]   Fax:  [phone]  Name: CHAUN UEMURA MRN: [id number] Date of Birth: 05-07-1951

## 2021-09-07 ENCOUNTER — Ambulatory Visit (HOSPITAL_BASED_OUTPATIENT_CLINIC_OR_DEPARTMENT_OTHER): Payer: PPO | Admitting: Physical Therapy

## 2021-09-07 ENCOUNTER — Other Ambulatory Visit: Payer: Self-pay

## 2021-09-07 DIAGNOSIS — R2689 Other abnormalities of gait and mobility: Secondary | ICD-10-CM

## 2021-09-07 DIAGNOSIS — M6281 Muscle weakness (generalized): Secondary | ICD-10-CM

## 2021-09-07 NOTE — Therapy (Signed)
[facility] [address] [place], [place], [address] Phone: [phone]   Fax:  [phone]  Physical Therapy Treatment  Patient Details  Name: Jerry York MRN: [id number] Date of Birth: 05/19/1951 Referring Provider (PT): [name]   Encounter Date: 09/07/2021   PT End of Session - 09/07/21 1627     Visit Number 4    Number of Visits 16    Date for PT Re-Evaluation 10/14/21    Authorization Type HTA    PT Start Time 0940    PT Stop Time 1020    PT Time Calculation (min) 40 min    Activity Tolerance Patient tolerated treatment well    Behavior During Therapy [facility] for tasks assessed/performed             Past Medical History:  Diagnosis Date   Anxiety    Bilateral renal cysts    Gout    02-22-2017 acute right foot gout---  per pt resolved   Gout    Gross hematuria    Heart murmur    History of BPH 08/14/2017   Hydronephrosis, left    Hyperlipidemia    Hypertension    Renal insufficiency    Rheumatoid arthritis (HCC)    Swelling    Type 2 diabetes mellitus (HCC)    Urinary retention    Wears glasses     Past Surgical History:  Procedure Laterality Date   APPENDECTOMY  age 76   CYSTOSCOPY WITH URETEROSCOPY AND STENT PLACEMENT Left 05/08/2017   Procedure: URETEROSCOPY AND STENT PLACEMENT;  Surgeon: Ihor Gully, MD;  Location: [facility] ;  Service: Urology;  Laterality: Left;   CYSTOSCOPY/RETROGRADE/URETEROSCOPY Bilateral 05/08/2017   Procedure: CYSTOSCOPY/ BILATERAL RETROGRADE;  Surgeon: Ihor Gully, MD;  Location: [facility];  Service: Urology;  Laterality: Bilateral;   HEMATOMA EVACUATION N/A 09/20/2019   Procedure: EVACUATION LUMBAR EPIDURAL HEMATOMA;  Surgeon: Tressie Stalker, MD;  Location: [facility] OR;  Service: Neurosurgery;  Laterality: N/A;  EVACUATION LUMBAR EPIDURAL HEMATOMA   INGUINAL HERNIA REPAIR Right 10/10/2000   KNEE ARTHROSCOPY Right 1980's   LUMBAR  LAMINECTOMY/DECOMPRESSION MICRODISCECTOMY N/A 09/16/2019   Procedure: Laminectomy and Foraminotomy - Lumbar Two-Lumbar Three - Lumbar Three-Lumbar Four - Lumbar Four-Lumbar Five - Lumbar Five-Sacral One;  Surgeon: Tia Alert, MD;  Location: [facility] OR;  Service: Neurosurgery;  Laterality: N/A;  Laminectomy and Foraminotomy - Lumbar Two-Lumbar Three - Lumbar Three-Lumbar Four - Lumbar Four-Lumbar Five - Lumbar Five-Sacral One   NASAL FRACTURE SURGERY     in highschool    SHOULDER ARTHROSCOPY WITH OPEN ROTATOR CUFF REPAIR Right 1990's   TRANSURETHRAL RESECTION OF PROSTATE      There were no vitals filed for this visit.   Subjective Assessment - 09/07/21 1624     Subjective Patient continues to have no complaints after his sessions. He had no pain today.    Pertinent History Lumbar surgery: laminectomy 2020, then complication and lumbar hematoma surgery 4 days later., hernia repair 2001.    Limitations Lifting;Standing;Walking;House hold activities    Currently in Pain? No/denies                  Pt seen for aquatic therapy today.  Treatment took place in water 3.25-4 ft in depth at the [facility] pool. Temp of water was 91.  Pt entered/exited the pool via stairs (step through pattern) independently with bilat rail.   Introduction to water. Had patient stand at different levels so she could  feel the bouncy    Warm up: heel/toe walking x4 laps across pool chest deep side stepping x4 laps from shallow to deep;    Long strides with posture 2 laps ; march with posture 2 laps    Exercises; Slow march x20 2lbs;  squats x20; hip extension x20; hip abduction x20; Sit to stand x20;     Seated: board trunk flexion x10 lateral board rotation x10 each way seated board press x20 seated    Seated bilateral dumbbell press 2x10   Noodle pull with manual resistance x20   Standing hip hinge with yellow board 2x10    Weight press down rainbow weights bent arm x20   Step up and hold  2x10 each leg with cuing for posture     Pt requires buoyancy for support and to offload joints with strengthening exercises. Viscosity of the water is needed for resistance of strengthening; water current perturbations provides challenge to standing balance unsupported, requiring increased core activation                      PT Education - 09/07/21 1627     Education Details HEP and symptom management    Person(s) Educated Patient    Methods Explanation;Demonstration;Tactile cues;Verbal cues    Comprehension Verbalized understanding;Returned demonstration;Verbal cues required;Tactile cues required              PT Short Term Goals - 09/08/21 1310       PT SHORT TERM GOAL #1   Title Pt to be independent with initial HEP    Baseline developing HEP for aquatics    Time 6    Period Weeks    Status On-going    Target Date 09/02/21               PT Long Term Goals - 08/22/21 1844       PT LONG TERM GOAL #1   Title Pt to be independent with final HEP    Time 8    Period Weeks    Status New    Target Date 10/14/21      PT LONG TERM GOAL #2   Title Pt to demo ability to stand with 25 deg or less lumbar flexion with standing and walking.    Time 8    Period Weeks    Status New    Target Date 10/14/21      PT LONG TERM GOAL #3   Title Pt to report ability for unsupported standing/walking at least 10 minutes to improve ability for ADLs and IADLS.    Time 8    Period Weeks    Status New    Target Date 10/14/21                   Plan - 09/08/21 1108     Clinical Impression Statement With cuing the patient can straighten up well. As he fatigues his posture flexes further forward. He has no increased pain with pool exercises. We eill continue to progress as tolerated. He reports that he does not have any exercises that he is doing at home. Therapy will reviewed some simple band exercises with him that he can continue to work on.     Personal Factors and Comorbidities Comorbidity 1    Comorbidities lumbar surgery, hernia surgery    Examination-Activity Limitations Stairs;Stand;Lift;Locomotion Level    Examination-Participation Restrictions Cleaning;Community Activity;Yard Work;Meal Prep    Stability/Clinical Decision Making Evolving/Moderate complexity  Clinical Decision Making Moderate    Rehab Potential Good    PT Frequency 2x / week    PT Duration 8 weeks    PT Treatment/Interventions ADLs/Self Care Home Management;Canalith Repostioning;Cryotherapy;Electrical Stimulation;Iontophoresis 4mg /ml Dexamethasone;Moist Heat;Traction;Balance training;Therapeutic exercise;Therapeutic activities;Functional mobility training;Stair training;Gait training;DME Instruction;Ultrasound;Neuromuscular re-education;Patient/family education;Manual techniques;Taping;Energy conservation;Dry needling;Passive range of motion;Spinal Manipulations;Joint Manipulations    PT Next Visit Plan Progress glute strengthening    PT Home Exercise Plan [email or web address]    Consulted and Agree with Plan of Care Patient             Patient will benefit from skilled therapeutic intervention in order to improve the following deficits and impairments:  Abnormal gait, Decreased endurance, Pain, Decreased strength, Decreased activity tolerance, Decreased balance, Decreased mobility, Difficulty walking, Improper body mechanics, Postural dysfunction, Decreased range of motion  Visit Diagnosis: Other abnormalities of gait and mobility  Muscle weakness (generalized)     Problem List Patient Active Problem List   Diagnosis Date Noted   Unstable gait 07/09/2021   Chronic left shoulder pain 04/23/2021   Pain due to onychomycosis of toenails of both feet 01/21/2020   Neurogenic bladder 09/25/2019   S/P lumbar laminectomy 09/16/2019   Lower extremity edema 12/11/2017   Family history of premature CAD 11/27/2017   Abnormal electrocardiogram (ECG) (EKG) -  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

## 2021-09-07 NOTE — Progress Notes (Signed)
I, Jerry York, LAT, ATC acting as a scribe for Jerry Graham, MD.  Jerry York is a 70 y.o. male who presents to [facility] today for f/u of significant gait difficulties and postural weakness. Pt was last seen by Dr. Denyse York on 07/28/21 and was advised to use a rolling walking and was referred back to aquatic PT, completing 3 visits. Today, pt reports notes he is still having trouble standing fully upright. Pt finds PT helpful.   He wonders if a clamshell type TLSO brace across his torso would be helpful to help him stand up straight.  He tried a more conventional TLSO brace but found it unusable because he had too much pressure on his chest wall.  Dx imaging: 09/20/19 L-spine MRI             09/16/19 L-spine XR  Pertinent review of systems: No fevers or chills  Relevant historical information: Hypertension and diabetes   Exam:  BP 110/78   Pulse 61   Ht 5\' 7"  (1.702 m)   Wt 170 lb 3.2 oz (77.2 kg)   SpO2 95%   BMI 26.66 kg/m  General: Well Developed, well nourished, and in no acute distress.   MSK: Distended abdomen with standing.  Unable to fully extend back with standing with weightbearing.  Uses a walker to ambulate.    Lab and Radiology Results  DG Lumbar Spine 2-3 Views  Result Date: 07/30/2021 CLINICAL DATA:  Back pain, gait abnormality EXAM: LUMBAR SPINE - 2-3 VIEW COMPARISON:  MRI 09/20/2019 FINDINGS: Normal lumbar lordosis. No acute fracture or listhesis of the lumbar spine. Bilateral laminectomy and resection of the spinous process of L3 and L4 has been performed, unchanged from prior examination. Vertebral body height and intervertebral disc heights are preserved. Paraspinal soft tissues are unremarkable. Vascular calcifications are seen within the aortoiliac vasculature. IMPRESSION: Status post L3-4 posterior decompression. No acute fracture or listhesis. Electronically Signed   By: 09/22/2019 MD   On: 07/30/2021 03:39        EXAM: CT ABDOMEN AND PELVIS WITHOUT CONTRAST   TECHNIQUE: Multidetector CT imaging of the abdomen and pelvis was performed following the standard protocol without IV contrast.   COMPARISON:  11/14/2017   FINDINGS: Lower chest: No acute findings.   Hepatobiliary: No mass visualized on this unenhanced exam. Multiple cysts in both right and left lobes remains stable, largest in the inferior right lobe measuring 5.9 cm. A few tiny 2-3 mm gallstones are again seen, however there is no evidence of cholecystitis or biliary ductal dilatation.   Pancreas: No mass or inflammatory process visualized on this unenhanced exam.   Spleen:  Within normal limits in size.   Adrenals/Urinary tract: Multiple bilateral Bosniak category 1 and 2 renal cysts are again seen. No evidence of urolithiasis or hydronephrosis. Mild diffuse bladder wall thickening is again seen, likely due to chronic bladder outlet obstruction.   Stomach/Bowel: No evidence of obstruction, inflammatory process, or abnormal fluid collections.   Vascular/Lymphatic: No pathologically enlarged lymph nodes identified. No evidence of abdominal aortic aneurysm. Aortic atherosclerotic calcification noted.   Reproductive: Mildly enlarged prostate gland, with prior TURP defect.   Other:  No evidence of abdominal wall hernia or mass.   Musculoskeletal: No suspicious bone lesions identified. Moderate bilateral hip osteoarthritis noted.   IMPRESSION: No evidence of abdominal wall hernia, mass, or other acute findings.   Cholelithiasis. No radiographic evidence of cholecystitis.   Mildly enlarged prostate with prior  TURP defect, and findings of chronic bladder outlet obstruction.   Aortic Atherosclerosis (ICD10-I70.0).     Electronically Signed   By: Danae Orleans M.D.   On: 11/06/2020 16:31  I, Jerry York, personally (independently) visualized and performed the interpretation of the images attached in this  note.      Assessment and Plan: 70 y.o. male with difficulty with walking and fall back extension thought to be primarily due to profound abdominal wall weakness.  Fundamental etiology is unclear but may be related to his surgery.  He is getting some benefit from aquatic physical therapy but I do not think ultimately he will be able to stand up fully on his own after aquatic physical therapy.  He wonders if a clamshell plastic type TLSO brace would better allow him to stand upright.  I am not sure but I think it is probably worth a try.  Based on the way his abdomen distends he is probably can need a custom one.  Good place to start would be [facility].  Check in about 6 weeks.   PDMP not reviewed this encounter. No orders of the defined types were placed in this encounter.  Meds ordered this encounter  Medications   AMBULATORY NON FORMULARY MEDICATION    Sig: Clamshell type TLSO bace. Custom formed for postural control and core support.  Disp 1. R17.8 R26.9    Dispense:  1 each    Refill:  0     Discussed warning signs or symptoms. Please see discharge instructions. Patient expresses understanding.   The above documentation has been reviewed and is accurate and complete Jerry York, M.D.

## 2021-09-08 ENCOUNTER — Other Ambulatory Visit: Payer: Self-pay

## 2021-09-08 ENCOUNTER — Ambulatory Visit (INDEPENDENT_AMBULATORY_CARE_PROVIDER_SITE_OTHER): Payer: PPO | Admitting: Family Medicine

## 2021-09-08 ENCOUNTER — Encounter (HOSPITAL_BASED_OUTPATIENT_CLINIC_OR_DEPARTMENT_OTHER): Payer: Self-pay | Admitting: Physical Therapy

## 2021-09-08 VITALS — BP 110/78 | HR 61 | Ht 67.0 in | Wt 170.2 lb

## 2021-09-08 DIAGNOSIS — R269 Unspecified abnormalities of gait and mobility: Secondary | ICD-10-CM | POA: Diagnosis not present

## 2021-09-08 DIAGNOSIS — R198 Other specified symptoms and signs involving the digestive system and abdomen: Secondary | ICD-10-CM | POA: Diagnosis not present

## 2021-09-08 MED ORDER — AMBULATORY NON FORMULARY MEDICATION: 0 refills | Status: DC

## 2021-09-08 NOTE — Patient Instructions (Signed)
Thank you for coming in today.   Plan for continued aquatic PT.   Lets tray that clamshell type TLSO brace. Let me know if you have trouble getting it.   Take the prescription to [facility] and ask for a consultation.  [facility] Address: [address] [place], [place] [address] Phone: [phone]  Recheck in about 6 weeks or so.

## 2021-09-12 ENCOUNTER — Other Ambulatory Visit: Payer: Self-pay | Admitting: Family Medicine

## 2021-09-13 ENCOUNTER — Telehealth: Payer: Self-pay | Admitting: Family Medicine

## 2021-09-13 DIAGNOSIS — M48061 Spinal stenosis, lumbar region without neurogenic claudication: Secondary | ICD-10-CM

## 2021-09-13 DIAGNOSIS — Z9889 Other specified postprocedural states: Secondary | ICD-10-CM

## 2021-09-13 NOTE — Telephone Encounter (Signed)
Tammy Sours from [employer] called regarding the brace that they are working on for him.  They are needing a diagnoses code related to his back to help with approval.  Please advise.  Tammy Sours can be reach at [phone].

## 2021-09-14 ENCOUNTER — Encounter (HOSPITAL_BASED_OUTPATIENT_CLINIC_OR_DEPARTMENT_OTHER): Payer: Self-pay | Admitting: Physical Therapy

## 2021-09-14 ENCOUNTER — Other Ambulatory Visit: Payer: Self-pay

## 2021-09-14 ENCOUNTER — Ambulatory Visit (HOSPITAL_BASED_OUTPATIENT_CLINIC_OR_DEPARTMENT_OTHER): Payer: PPO | Admitting: Physical Therapy

## 2021-09-14 DIAGNOSIS — R2689 Other abnormalities of gait and mobility: Secondary | ICD-10-CM | POA: Diagnosis not present

## 2021-09-14 DIAGNOSIS — M6281 Muscle weakness (generalized): Secondary | ICD-10-CM

## 2021-09-14 MED ORDER — AMBULATORY NON FORMULARY MEDICATION: 0 refills | Status: AC

## 2021-09-14 NOTE — Telephone Encounter (Signed)
Called [facility], given 2 additional codes per note from Dr. [name], printed RX not needed.

## 2021-09-14 NOTE — Telephone Encounter (Signed)
Diagnosis codes are Z98.890 M48.061  New Rx printed  At front desk ready to be faxed.  I do not know the fax number.

## 2021-09-15 ENCOUNTER — Encounter (HOSPITAL_BASED_OUTPATIENT_CLINIC_OR_DEPARTMENT_OTHER): Payer: Self-pay | Admitting: Physical Therapy

## 2021-09-15 NOTE — Therapy (Signed)
[facility] [address] [place], [place], [address] Phone: [phone]   Fax:  [phone]  Physical Therapy Treatment  Patient Details  Name: Jerry York MRN: [id number] Date of Birth: 1951/04/17 Referring Provider (PT): [name]   Encounter Date: 09/14/2021   PT End of Session - 09/14/21 2207     Visit Number 5    Number of Visits 16    Date for PT Re-Evaluation 10/14/21    PT Start Time 1145    PT Stop Time 1227    PT Time Calculation (min) 42 min    Activity Tolerance Patient tolerated treatment well    Behavior During Therapy [facility] for tasks assessed/performed             Past Medical History:  Diagnosis Date   Anxiety    Bilateral renal cysts    Gout    02-22-2017 acute right foot gout---  per pt resolved   Gout    Gross hematuria    Heart murmur    History of BPH 08/14/2017   Hydronephrosis, left    Hyperlipidemia    Hypertension    Renal insufficiency    Rheumatoid arthritis (HCC)    Swelling    Type 2 diabetes mellitus (HCC)    Urinary retention    Wears glasses     Past Surgical History:  Procedure Laterality Date   APPENDECTOMY  age 70   CYSTOSCOPY WITH URETEROSCOPY AND STENT PLACEMENT Left 05/08/2017   Procedure: URETEROSCOPY AND STENT PLACEMENT;  Surgeon: Ihor Gully, MD;  Location: [facility];  Service: Urology;  Laterality: Left;   CYSTOSCOPY/RETROGRADE/URETEROSCOPY Bilateral 05/08/2017   Procedure: CYSTOSCOPY/ BILATERAL RETROGRADE;  Surgeon: Ihor Gully, MD;  [facility];  Service: Urology;  Laterality: Bilateral;   HEMATOMA EVACUATION N/A 09/20/2019   Procedure: EVACUATION LUMBAR EPIDURAL HEMATOMA;  Surgeon: Tressie Stalker, MD;  Location: [facility] OR;  Service: Neurosurgery;  Laterality: N/A;  EVACUATION LUMBAR EPIDURAL HEMATOMA   INGUINAL HERNIA REPAIR Right 10/10/2000   KNEE ARTHROSCOPY Right 1980's   LUMBAR LAMINECTOMY/DECOMPRESSION  MICRODISCECTOMY N/A 09/16/2019   Procedure: Laminectomy and Foraminotomy - Lumbar Two-Lumbar Three - Lumbar Three-Lumbar Four - Lumbar Four-Lumbar Five - Lumbar Five-Sacral One;  Surgeon: Tia Alert, MD;  Location: [facility] OR;  Service: Neurosurgery;  Laterality: N/A;  Laminectomy and Foraminotomy - Lumbar Two-Lumbar Three - Lumbar Three-Lumbar Four - Lumbar Four-Lumbar Five - Lumbar Five-Sacral One   NASAL FRACTURE SURGERY     in highschool    SHOULDER ARTHROSCOPY WITH OPEN ROTATOR CUFF REPAIR Right 1990's   TRANSURETHRAL RESECTION OF PROSTATE      There were no vitals filed for this visit.   Subjective Assessment - 09/14/21 2205     Subjective Patient reports no signifcant change    Pertinent History Lumbar surgery: laminectomy 2020, then complication and lumbar hematoma surgery 4 days later., hernia repair 2001.    Limitations Lifting;Standing;Walking;House hold activities    Patient Stated Goals stand up straighter, stand for longer, "walk better"    Currently in Pain? No/denies                Pt seen for aquatic therapy today.  Treatment took place in water 3.25-4 ft in depth at the [facility] pool. Temp of water was 91.  Pt entered/exited the pool via stairs (step through pattern) independently with bilat rail.   Introduction to water. Had patient stand at different levels so she could feel the bouncy  Warm up: heel/toe walking x4 laps across pool chest deep side stepping x4 laps from shallow to deep;    Long strides with posture 2 laps ; march with posture 2 laps    Exercises; Slow march x20 2lbs;  squats x20; hip extension x20; hip abduction x20; Sit to stand x20;     Seated: board trunk flexion x10 lateral board rotation x10 each way seated board press x20 seated    Seated bilateral dumbbell press 2x10    Noodle pull with manual resistance x20    Standing hip hinge with yellow board 2x10    Weight press down rainbow weights bent arm x20    Step up  and hold 2x10 each leg with cuing for posture   Standing paddles extension to the wall.      Pt requires buoyancy for support and to offload joints with strengthening exercises. Viscosity of the water is needed for resistance of strengthening; water current perturbations provides challenge to standing balance unsupported, requiring increased core activation                        PT Education - 09/14/21 2206     Education Details frequnt cuing and education for posture    Person(s) Educated Patient    Methods Explanation;Demonstration;Tactile cues;Verbal cues    Comprehension Verbalized understanding;Returned demonstration;Verbal cues required;Tactile cues required              PT Short Term Goals - 09/08/21 1310       PT SHORT TERM GOAL #1   Title Pt to be independent with initial HEP    Baseline developing HEP for aquatics    Time 6    Period Weeks    Status On-going    Target Date 09/02/21               PT Long Term Goals - 08/22/21 1844       PT LONG TERM GOAL #1   Title Pt to be independent with final HEP    Time 8    Period Weeks    Status New    Target Date 10/14/21      PT LONG TERM GOAL #2   Title Pt to demo ability to stand with 25 deg or less lumbar flexion with standing and walking.    Time 8    Period Weeks    Status New    Target Date 10/14/21      PT LONG TERM GOAL #3   Title Pt to report ability for unsupported standing/walking at least 10 minutes to improve ability for ADLs and IADLS.    Time 8    Period Weeks    Status New    Target Date 10/14/21                   Plan - 09/15/21 1257     Clinical Impression Statement With cuing patient can straighen up but he can not maintain a straight pusture. We used the pool wall for cuing  today. Therapy continues to [facility] on exercises for the posteriro chain including the glutes and the spinal extensors. He was advised to look for carryover as we go along.     Personal Factors and Comorbidities Comorbidity 1    Comorbidities lumbar surgery, hernia surgery    Examination-Activity Limitations Stairs;Stand;Lift;Locomotion Level    Examination-Participation Restrictions Cleaning;Community Activity;Yard Work;Meal Prep    Stability/Clinical Decision Making Evolving/Moderate complexity  Clinical Decision Making Moderate    Rehab Potential Good    PT Frequency 2x / week    PT Duration 8 weeks    PT Treatment/Interventions ADLs/Self Care Home Management;Canalith Repostioning;Cryotherapy;Electrical Stimulation;Iontophoresis 4mg /ml Dexamethasone;Moist Heat;Traction;Balance training;Therapeutic exercise;Therapeutic activities;Functional mobility training;Stair training;Gait training;DME Instruction;Ultrasound;Neuromuscular re-education;Patient/family education;Manual techniques;Taping;Energy conservation;Dry needling;Passive range of motion;Spinal Manipulations;Joint Manipulations    PT Next Visit Plan Progress glute strengthening    PT Home Exercise Plan [facility]             Patient will benefit from skilled therapeutic intervention in order to improve the following deficits and impairments:  Abnormal gait, Decreased endurance, Pain, Decreased strength, Decreased activity tolerance, Decreased balance, Decreased mobility, Difficulty walking, Improper body mechanics, Postural dysfunction, Decreased range of motion  Visit Diagnosis: Other abnormalities of gait and mobility  Muscle weakness (generalized)     Problem List Patient Active Problem List   Diagnosis Date Noted   Unstable gait 07/09/2021   Chronic left shoulder pain 04/23/2021   Pain due to onychomycosis of toenails of both feet 01/21/2020   Neurogenic bladder 09/25/2019   S/P lumbar laminectomy 09/16/2019   Lower extremity edema 12/11/2017   Family history of premature CAD 11/27/2017   Abnormal electrocardiogram (ECG) (EKG) - 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 [place], [place], [place] Phone: [phone]   Fax:  [phone]  Name: Jerry York MRN: Melene Plan Date of Birth: 17-Sep-1951

## 2021-09-21 ENCOUNTER — Other Ambulatory Visit: Payer: Self-pay

## 2021-09-21 ENCOUNTER — Ambulatory Visit (HOSPITAL_BASED_OUTPATIENT_CLINIC_OR_DEPARTMENT_OTHER): Payer: PPO | Admitting: Physical Therapy

## 2021-09-21 DIAGNOSIS — M6281 Muscle weakness (generalized): Secondary | ICD-10-CM

## 2021-09-21 DIAGNOSIS — R2689 Other abnormalities of gait and mobility: Secondary | ICD-10-CM

## 2021-09-22 ENCOUNTER — Encounter (HOSPITAL_BASED_OUTPATIENT_CLINIC_OR_DEPARTMENT_OTHER): Payer: Self-pay | Admitting: Physical Therapy

## 2021-09-22 NOTE — Therapy (Signed)
[facility] [address] [place], [place], [address] Phone: [phone]   Fax:  [phone]  Physical Therapy Treatment  Patient Details  Name: Jerry York MRN: [id number] Date of Birth: 1951/11/20 Referring Provider (PT): [name]   Encounter Date: 09/21/2021   PT End of Session - 09/22/21 1552     Visit Number 6    Number of Visits 16    Date for PT Re-Evaluation 10/14/21    Authorization Type HTA    PT Start Time 1145    PT Stop Time 1228    PT Time Calculation (min) 43 min    Activity Tolerance Patient tolerated treatment well    Behavior During Therapy [facility] for tasks assessed/performed             Past Medical History:  Diagnosis Date   Anxiety    Bilateral renal cysts    Gout    02-22-2017 acute right foot gout---  per pt resolved   Gout    Gross hematuria    Heart murmur    History of BPH 08/14/2017   Hydronephrosis, left    Hyperlipidemia    Hypertension    Renal insufficiency    Rheumatoid arthritis (HCC)    Swelling    Type 2 diabetes mellitus (HCC)    Urinary retention    Wears glasses     Past Surgical History:  Procedure Laterality Date   APPENDECTOMY  age 74   CYSTOSCOPY WITH URETEROSCOPY AND STENT PLACEMENT Left 05/08/2017   Procedure: URETEROSCOPY AND STENT PLACEMENT;  Surgeon: Ihor Gully, MD;  Location: [facility];  Service: Urology;  Laterality: Left;   CYSTOSCOPY/RETROGRADE/URETEROSCOPY Bilateral 05/08/2017   Procedure: CYSTOSCOPY/ BILATERAL RETROGRADE;  Surgeon: Ihor Gully, MD;  Location: [facility];  Service: Urology;  Laterality: Bilateral;   HEMATOMA EVACUATION N/A 09/20/2019   Procedure: EVACUATION LUMBAR EPIDURAL HEMATOMA;  Surgeon: Tressie Stalker, MD;  Location: [facility] OR;  Service: Neurosurgery;  Laterality: N/A;  EVACUATION LUMBAR EPIDURAL HEMATOMA   INGUINAL HERNIA REPAIR Right 10/10/2000   KNEE ARTHROSCOPY Right 1980's   LUMBAR  LAMINECTOMY/DECOMPRESSION MICRODISCECTOMY N/A 09/16/2019   Procedure: Laminectomy and Foraminotomy - Lumbar Two-Lumbar Three - Lumbar Three-Lumbar Four - Lumbar Four-Lumbar Five - Lumbar Five-Sacral One;  Surgeon: Tia Alert, MD;  Location: [facility] OR;  Service: Neurosurgery;  Laterality: N/A;  Laminectomy and Foraminotomy - Lumbar Two-Lumbar Three - Lumbar Three-Lumbar Four - Lumbar Four-Lumbar Five - Lumbar Five-Sacral One   NASAL FRACTURE SURGERY     in highschool    SHOULDER ARTHROSCOPY WITH OPEN ROTATOR CUFF REPAIR Right 1990's   TRANSURETHRAL RESECTION OF PROSTATE      There were no vitals filed for this visit.   Subjective Assessment - 09/22/21 1549     Subjective Patient reports no back pain but no significant improvement in pain either.    Pertinent History Lumbar surgery: laminectomy 2020, then complication and lumbar hematoma surgery 4 days later., hernia repair 2001.    Limitations Lifting;Standing;Walking;House hold activities    Patient Stated Goals stand up straighter, stand for longer, "walk better"    Currently in Pain? No/denies                 Pt seen for aquatic therapy today.  Treatment took place in water 3.25-4 ft in depth at the [facility] pool. Temp of water was 91.  Pt entered/exited the pool via stairs (step through pattern) independently with bilat rail.   Introduction  to water. Had patient stand at different levels so she could feel the bouncy    Warm up: heel/toe walking x4 laps across pool chest deep side stepping x4 laps from shallow to deep;    Long strides with posture 2 laps ; march with posture 2 laps    Exercises; Slow march x20 2lbs;  squats x20; hip extension x20; hip abduction x20; Sit to stand x20;     Seated: board trunk flexion x10 lateral board rotation x10 each way seated board press x20 seated    Seated bilateral dumbbell press 2x10    Noodle pull with manual resistance x20    Standing hip hinge with yellow board 2x10     Weight press down rainbow weights bent arm x20    Step up and hold 2x10 each leg with cuing for posture    Seated paddles extension to the wall x20;  ER/IR x20; circles x20 in and out;    Yellow weight reach out x20 and pull back x20      Pt requires buoyancy for support and to offload joints with strengthening exercises. Viscosity of the water is needed for resistance of strengthening; water current perturbations provides challenge to standing balance unsupported, requiring increased core activation                       PT Education - 09/22/21 1552     Education Details continued cuing for posture    Person(s) Educated Patient    Methods Explanation;Demonstration;Tactile cues;Verbal cues    Comprehension Verbalized understanding;Returned demonstration;Verbal cues required;Tactile cues required              PT Short Term Goals - 09/08/21 1310       PT SHORT TERM GOAL #1   Title Pt to be independent with initial HEP    Baseline developing HEP for aquatics    Time 6    Period Weeks    Status On-going    Target Date 09/02/21               PT Long Term Goals - 08/22/21 1844       PT LONG TERM GOAL #1   Title Pt to be independent with final HEP    Time 8    Period Weeks    Status New    Target Date 10/14/21      PT LONG TERM GOAL #2   Title Pt to demo ability to stand with 25 deg or less lumbar flexion with standing and walking.    Time 8    Period Weeks    Status New    Target Date 10/14/21      PT LONG TERM GOAL #3   Title Pt to report ability for unsupported standing/walking at least 10 minutes to improve ability for ADLs and IADLS.    Time 8    Period Weeks    Status New    Target Date 10/14/21                   Plan - 09/22/21 1552     Clinical Impression Statement Patient continues to tolerate treatment well. He isn't having any pain. He may have required less cuing toay for posture. We did exercises against  the wall so he had tactile cuing for posutre. With ambaultion exercises he can walk some steps thne his posture collapses.    Personal Factors and Comorbidities Comorbidity 1    Comorbidities lumbar  surgery, hernia surgery    Examination-Activity Limitations Stairs;Stand;Lift;Locomotion Level    Examination-Participation Restrictions Cleaning;Community Activity;Yard Work;Meal Prep    Stability/Clinical Decision Making Evolving/Moderate complexity    Clinical Decision Making Moderate    Rehab Potential Good    PT Frequency 2x / week    PT Duration 8 weeks    PT Treatment/Interventions ADLs/Self Care Home Management;Canalith Repostioning;Cryotherapy;Electrical Stimulation;Iontophoresis 4mg /ml Dexamethasone;Moist Heat;Traction;Balance training;Therapeutic exercise;Therapeutic activities;Functional mobility training;Stair training;Gait training;DME Instruction;Ultrasound;Neuromuscular re-education;Patient/family education;Manual techniques;Taping;Energy conservation;Dry needling;Passive range of motion;Spinal Manipulations;Joint Manipulations    PT Next Visit Plan Progress glute strengthening    PT Home Exercise Plan [email or web address]    Consulted and Agree with Plan of Care Patient             Patient will benefit from skilled therapeutic intervention in order to improve the following deficits and impairments:  Abnormal gait, Decreased endurance, Pain, Decreased strength, Decreased activity tolerance, Decreased balance, Decreased mobility, Difficulty walking, Improper body mechanics, Postural dysfunction, Decreased range of motion  Visit Diagnosis: Other abnormalities of gait and mobility  Muscle weakness (generalized)     Problem List Patient Active Problem List   Diagnosis Date Noted   Unstable gait 07/09/2021   Chronic left shoulder pain 04/23/2021   Pain due to onychomycosis of toenails of both feet 01/21/2020   Neurogenic bladder 09/25/2019   S/P lumbar laminectomy 09/16/2019   Lower  extremity edema 12/11/2017   Family history of premature CAD 11/27/2017   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 [place], [place], [place] Phone: [phone]   Fax:  [phone]  Name: AUTRY PRUST MRN: Melene Plan Date of Birth: 1951-10-18

## 2021-10-01 ENCOUNTER — Other Ambulatory Visit: Payer: Self-pay

## 2021-10-01 ENCOUNTER — Ambulatory Visit (HOSPITAL_BASED_OUTPATIENT_CLINIC_OR_DEPARTMENT_OTHER): Payer: PPO | Attending: Family Medicine | Admitting: Physical Therapy

## 2021-10-01 DIAGNOSIS — R2689 Other abnormalities of gait and mobility: Secondary | ICD-10-CM | POA: Insufficient documentation

## 2021-10-01 DIAGNOSIS — M6281 Muscle weakness (generalized): Secondary | ICD-10-CM | POA: Diagnosis not present

## 2021-10-03 ENCOUNTER — Encounter (HOSPITAL_BASED_OUTPATIENT_CLINIC_OR_DEPARTMENT_OTHER): Payer: Self-pay | Admitting: Physical Therapy

## 2021-10-03 NOTE — Therapy (Signed)
[facility] [address] [place], [place], [address] Phone: [phone]   Fax:  [phone]  Physical Therapy Treatment  Patient Details  Name: Jerry York MRN: [id number] Date of Birth: [birth year] Referring Provider (PT): [name]   Encounter Date: 10/01/2021   PT End of Session - 10/03/21 2039     Visit Number 7    Number of Visits 16    Date for PT Re-Evaluation 10/14/21    Authorization Type HTA    PT Start Time 1145    PT Stop Time 1226    PT Time Calculation (min) 41 min    Activity Tolerance Patient tolerated treatment well    Behavior During Therapy [facility] for tasks assessed/performed             Past Medical History:  Diagnosis Date   Anxiety    Bilateral renal cysts    Gout    02-22-2017 acute right foot gout---  per pt resolved   Gout    Gross hematuria    Heart murmur    History of BPH 08/14/2017   Hydronephrosis, left    Hyperlipidemia    Hypertension    Renal insufficiency    Rheumatoid arthritis (HCC)    Swelling    Type 2 diabetes mellitus (HCC)    Urinary retention    Wears glasses     Past Surgical History:  Procedure Laterality Date   APPENDECTOMY  age 82   CYSTOSCOPY WITH URETEROSCOPY AND STENT PLACEMENT Left 05/08/2017   Procedure: URETEROSCOPY AND STENT PLACEMENT;  Surgeon: Ihor Gully, MD;  Location: [facility];  Service: Urology;  Laterality: Left;   CYSTOSCOPY/RETROGRADE/URETEROSCOPY Bilateral 05/08/2017   Procedure: CYSTOSCOPY/ BILATERAL RETROGRADE;  Surgeon: Ihor Gully, MD;  Location: [facility];  Service: Urology;  Laterality: Bilateral;   HEMATOMA EVACUATION N/A 09/20/2019   Procedure: EVACUATION LUMBAR EPIDURAL HEMATOMA;  Surgeon: Tressie Stalker, MD;  Location: [facility] OR;  Service: Neurosurgery;  Laterality: N/A;  EVACUATION LUMBAR EPIDURAL HEMATOMA   INGUINAL HERNIA REPAIR Right 10/10/2000   KNEE ARTHROSCOPY Right 1980's   LUMBAR  LAMINECTOMY/DECOMPRESSION MICRODISCECTOMY N/A 09/16/2019   Procedure: Laminectomy and Foraminotomy - Lumbar Two-Lumbar Three - Lumbar Three-Lumbar Four - Lumbar Four-Lumbar Five - Lumbar Five-Sacral One;  Surgeon: Tia Alert, MD;  Location: [facility] OR;  Service: Neurosurgery;  Laterality: N/A;  Laminectomy and Foraminotomy - Lumbar Two-Lumbar Three - Lumbar Three-Lumbar Four - Lumbar Four-Lumbar Five - Lumbar Five-Sacral One   NASAL FRACTURE SURGERY     in highschool    SHOULDER ARTHROSCOPY WITH OPEN ROTATOR CUFF REPAIR Right 1990's   TRANSURETHRAL RESECTION OF PROSTATE      There were no vitals filed for this visit.   Subjective Assessment - 10/03/21 2013     Subjective Patient reports no low back pain. He continues to have a significant forward lean when he is walking even with the walker.    Pertinent History Lumbar surgery: laminectomy 2020, then complication and lumbar hematoma surgery 4 days later., hernia repair 2001.    Limitations Lifting;Standing;Walking;House hold activities    Patient Stated Goals stand up straighter, stand for longer, "walk better"    Currently in Pain? No/denies                      Pt seen for aquatic therapy today.  Treatment took place in water 3.25-4 ft in depth at the [facility] pool. Temp of water was 91.  Pt  entered/exited the pool via stairs (step through pattern) independently with bilat rail.   Introduction to water. Had patient stand at different levels so she could feel the bouncy    Warm up: heel/toe walking x4 laps across pool chest deep side stepping x4 laps from shallow to deep;    Long strides with posture 2 laps ; march with posture 2 laps    Exercises; Slow march x20 2lbs;  squats x20; hip extension x20; hip abduction x20; Sit to stand x20;     Seated: board trunk flexion x10 lateral board rotation x10 each way seated board press x20 seated    Seated bilateral dumbbell press 2x10    Noodle pull with manual  resistance x20    Standing hip hinge with yellow board 2x10    Weight press down rainbow weights bent arm x20    Step up and hold 2x10 each leg with cuing for posture   side steps 2x10    Yellow weight reach out x20 and pull back x20      Pt requires buoyancy for support and to offload joints with strengthening exercises. Viscosity of the water is needed for resistance of strengthening; water current perturbations provides challenge to standing balance unsupported, requiring increased core activation                  PT Education - 10/03/21 2015     Person(s) Educated Patient    Methods Explanation;Demonstration;Tactile cues;Verbal cues    Comprehension Verbalized understanding;Returned demonstration;Verbal cues required;Tactile cues required              PT Short Term Goals - 09/08/21 1310       PT SHORT TERM GOAL #1   Title Pt to be independent with initial HEP    Baseline developing HEP for aquatics    Time 6    Period Weeks    Status On-going    Target Date 09/02/21               PT Long Term Goals - 08/22/21 1844       PT LONG TERM GOAL #1   Title Pt to be independent with final HEP    Time 8    Period Weeks    Status New    Target Date 10/14/21      PT LONG TERM GOAL #2   Title Pt to demo ability to stand with 25 deg or less lumbar flexion with standing and walking.    Time 8    Period Weeks    Status New    Target Date 10/14/21      PT LONG TERM GOAL #3   Title Pt to report ability for unsupported standing/walking at least 10 minutes to improve ability for ADLs and IADLS.    Time 8    Period Weeks    Status New    Target Date 10/14/21                   Plan - 10/03/21 2040     Clinical Impression Statement No real change from previous visits. Patient can correct with cuing but quickly goes back to leaning over. He can correct standing next to the wall    Personal Factors and Comorbidities Comorbidity 1     Comorbidities lumbar surgery, hernia surgery    Examination-Activity Limitations Stairs;Stand;Lift;Locomotion Level    Examination-Participation Restrictions Cleaning;Community Activity;Yard Work;Meal Prep    Stability/Clinical Decision Making Evolving/Moderate complexity    Clinical  Decision Making Moderate    Rehab Potential Good    PT Frequency 2x / week    PT Duration 8 weeks    PT Treatment/Interventions ADLs/Self Care Home Management;Canalith Repostioning;Cryotherapy;Electrical Stimulation;Iontophoresis 4mg /ml Dexamethasone;Moist Heat;Traction;Balance training;Therapeutic exercise;Therapeutic activities;Functional mobility training;Stair training;Gait training;DME Instruction;Ultrasound;Neuromuscular re-education;Patient/family education;Manual techniques;Taping;Energy conservation;Dry needling;Passive range of motion;Spinal Manipulations;Joint Manipulations    PT Next Visit Plan Progress glute strengthening    PT Home Exercise Plan [email or web address]    Consulted and Agree with Plan of Care Patient             Patient will benefit from skilled therapeutic intervention in order to improve the following deficits and impairments:  Abnormal gait, Decreased endurance, Pain, Decreased strength, Decreased activity tolerance, Decreased balance, Decreased mobility, Difficulty walking, Improper body mechanics, Postural dysfunction, Decreased range of motion  Visit Diagnosis: Other abnormalities of gait and mobility  Muscle weakness (generalized)     Problem List Patient Active Problem List   Diagnosis Date Noted   Unstable gait 07/09/2021   Chronic left shoulder pain 04/23/2021   Pain due to onychomycosis of toenails of both feet 01/21/2020   Neurogenic bladder 09/25/2019   S/P lumbar laminectomy 09/16/2019   Lower extremity edema 12/11/2017   Family history of premature CAD 11/27/2017   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 [place], [place], [place] Phone: [phone]   Fax:  [phone]  Name: Jerry York MRN: Melene Plan Date of Birth: 01/24/1951

## 2021-10-08 ENCOUNTER — Other Ambulatory Visit: Payer: Self-pay

## 2021-10-08 ENCOUNTER — Ambulatory Visit (HOSPITAL_BASED_OUTPATIENT_CLINIC_OR_DEPARTMENT_OTHER): Payer: PPO | Admitting: Physical Therapy

## 2021-10-08 DIAGNOSIS — R2689 Other abnormalities of gait and mobility: Secondary | ICD-10-CM

## 2021-10-08 DIAGNOSIS — M6281 Muscle weakness (generalized): Secondary | ICD-10-CM

## 2021-10-09 ENCOUNTER — Encounter (HOSPITAL_BASED_OUTPATIENT_CLINIC_OR_DEPARTMENT_OTHER): Payer: Self-pay | Admitting: Physical Therapy

## 2021-10-09 NOTE — Therapy (Addendum)
[facility] [address] [place], [place], [address] Phone: [phone]   Fax:  [phone]  Physical Therapy Treatment  Patient Details  Name: Jerry York MRN: [id number] Date of Birth: [birth year] Referring Provider (PT): Lynne Leader   Encounter Date: 10/08/2021   PT End of Session - 10/09/21 1403     Visit Number 8    Number of Visits 16    Date for PT Re-Evaluation 10/14/21    Authorization Type HTA    PT Start Time 1345    PT Stop Time 1428    PT Time Calculation (min) 43 min    Activity Tolerance Patient tolerated treatment well    Behavior During Therapy [facility] for tasks assessed/performed             Past Medical History:  Diagnosis Date   Anxiety    Bilateral renal cysts    Gout    02-22-2017 acute right foot gout---  per pt resolved   Gout    Gross hematuria    Heart murmur    History of BPH 08/14/2017   Hydronephrosis, left    Hyperlipidemia    Hypertension    Renal insufficiency    Rheumatoid arthritis ([place])    Swelling    Type 2 diabetes mellitus ([place])    Urinary retention    Wears glasses     Past Surgical History:  Procedure Laterality Date   APPENDECTOMY  age 60   CYSTOSCOPY WITH URETEROSCOPY AND STENT PLACEMENT Left 05/08/2017   Procedure: URETEROSCOPY AND STENT PLACEMENT;  Surgeon: Kathie Rhodes, MD;  Location: [facility];  Service: Urology;  Laterality: Left;   CYSTOSCOPY/RETROGRADE/URETEROSCOPY Bilateral 05/08/2017   Procedure: CYSTOSCOPY/ BILATERAL RETROGRADE;  Surgeon: Kathie Rhodes, MD;  Location: [facility];  Service: Urology;  Laterality: Bilateral;   HEMATOMA EVACUATION N/A 09/20/2019   Procedure: EVACUATION LUMBAR EPIDURAL HEMATOMA;  Surgeon: Newman Pies, MD;  Location: [facility];  Service: Neurosurgery;  Laterality: N/A;  EVACUATION LUMBAR EPIDURAL HEMATOMA   INGUINAL HERNIA REPAIR Right 10/10/2000   KNEE ARTHROSCOPY Right 1980's   LUMBAR  LAMINECTOMY/DECOMPRESSION MICRODISCECTOMY N/A 09/16/2019   Procedure: Laminectomy and Foraminotomy - Lumbar Two-Lumbar Three - Lumbar Three-Lumbar Four - Lumbar Four-Lumbar Five - Lumbar Five-Sacral One;  Surgeon: Eustace Moore, MD;  [facility];  Service: Neurosurgery;  Laterality: N/A;  Laminectomy and Foraminotomy - Lumbar Two-Lumbar Three - Lumbar Three-Lumbar Four - Lumbar Four-Lumbar Five - Lumbar Five-Sacral One   NASAL FRACTURE SURGERY     in highschool    SHOULDER ARTHROSCOPY WITH OPEN ROTATOR CUFF REPAIR Right 1990's   TRANSURETHRAL RESECTION OF PROSTATE      There were no vitals filed for this visit.   Subjective Assessment - 10/09/21 1402     Subjective No real change. Patient reports no pain with treatment but no imprvement either.    Pertinent History Lumbar surgery: laminectomy 2800, then complication and lumbar hematoma surgery 4 days later., hernia repair 2001.    Limitations Lifting;Standing;Walking;House hold activities    Patient Stated Goals stand up straighter, stand for longer, "walk better"    Currently in Pain? No/denies                  Pt seen for aquatic therapy today.  Treatment took place in water 3.25-4 ft in depth at the [facility] pool. Temp of water was 91.  Pt entered/exited the pool via stairs (step through pattern) independently with bilat rail.  Introduction to water. Had patient stand at different levels so she could feel the bouncy    Warm up: heel/toe walking x4 laps across pool chest deep side stepping x4 laps from shallow to deep;    Long strides with posture 2 laps ; march with posture 2 laps    Exercises; Slow march x20 2lbs;  squats x20; hip extension x20; hip abduction x20; Sit to stand x20;     Seated: board trunk flexion x10 lateral board rotation x10 each way seated board press x20 seated    Seated bilateral dumbbell press 2x10    Noodle pull with manual resistance x20    Standing hip hinge with yellow  board 2x10    Weight press down rainbow weights bent arm x20    Step up and hold 2x10 each leg with cuing for posture   side steps 2x10    Yellow weight reach out x20 and pull back x20      Pt requires buoyancy for support and to offload joints with strengthening exercises. Viscosity of the water is needed for resistance of strengthening; water current perturbations provides challenge to standing balance unsupported, requiring increased core activation                      PT Education - 10/09/21 1403     Education Details HEP and symptom management    Person(s) Educated Patient    Methods Explanation;Tactile cues;Verbal cues;Demonstration    Comprehension Verbalized understanding;Returned demonstration;Tactile cues required;Verbal cues required              PT Short Term Goals - 09/08/21 1310       PT SHORT TERM GOAL #1   Title Pt to be independent with initial HEP    Baseline developing HEP for aquatics    Time 6    Period Weeks    Status On-going    Target Date 09/02/21               PT Long Term Goals - 08/22/21 1844       PT LONG TERM GOAL #1   Title Pt to be independent with final HEP    Time 8    Period Weeks    Status New    Target Date 10/14/21      PT LONG TERM GOAL #2   Title Pt to demo ability to stand with 25 deg or less lumbar flexion with standing and walking.    Time 8    Period Weeks    Status New    Target Date 10/14/21      PT LONG TERM GOAL #3   Title Pt to report ability for unsupported standing/walking at least 10 minutes to improve ability for ADLs and IADLS.    Time 8    Period Weeks    Status New    Target Date 10/14/21                   Plan - 10/09/21 1404     Clinical Impression Statement With cuing patient can straighten up but he can not maintain. We used the wall to help posture. We will continue to progress as tolerated.    Personal Factors and Comorbidities Comorbidity 1     Comorbidities lumbar surgery, hernia surgery    Examination-Activity Limitations Stairs;Stand;Lift;Locomotion Level    Examination-Participation Restrictions Cleaning;Community Activity;Yard Work;Meal Prep    Stability/Clinical Decision Making Evolving/Moderate complexity    Clinical Decision Making  Moderate    Rehab Potential Good    PT Frequency 2x / week    PT Duration 8 weeks    PT Treatment/Interventions ADLs/Self Care Home Management;Canalith Repostioning;Cryotherapy;Electrical Stimulation;Iontophoresis 61m/ml Dexamethasone;Moist Heat;Traction;Balance training;Therapeutic exercise;Therapeutic activities;Functional mobility training;Stair training;Gait training;DME Instruction;Ultrasound;Neuromuscular re-education;Patient/family education;Manual techniques;Taping;Energy conservation;Dry needling;Passive range of motion;Spinal Manipulations;Joint Manipulations    PT Next Visit Plan Progress glute strengthening    PT Home Exercise Plan [id number]    Consulted and Agree with Plan of Care Patient             Patient will benefit from skilled therapeutic intervention in order to improve the following deficits and impairments:  Abnormal gait, Decreased endurance, Pain, Decreased strength, Decreased activity tolerance, Decreased balance, Decreased mobility, Difficulty walking, Improper body mechanics, Postural dysfunction, Decreased range of motion  Visit Diagnosis: Other abnormalities of gait and mobility  Muscle weakness (generalized)     Problem List Patient Active Problem List   Diagnosis Date Noted   Unstable gait 07/09/2021   Chronic left shoulder pain 04/23/2021   Pain due to onychomycosis of toenails of both feet 01/21/2020   Neurogenic bladder 09/25/2019   S/P lumbar laminectomy 09/16/2019   Lower extremity edema 12/11/2017   Family history of premature CAD 11/27/2017   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  Patient goals were partially met. Patient is being discharged due to -not returning since last visit.        LLyndee Hensen PT, DPT 11:48 AM  09/26/22

## 2021-10-11 ENCOUNTER — Telehealth: Payer: Self-pay

## 2021-10-11 ENCOUNTER — Telehealth (INDEPENDENT_AMBULATORY_CARE_PROVIDER_SITE_OTHER): Payer: PPO | Admitting: Family Medicine

## 2021-10-11 VITALS — Ht 67.0 in

## 2021-10-11 DIAGNOSIS — J329 Chronic sinusitis, unspecified: Secondary | ICD-10-CM | POA: Diagnosis not present

## 2021-10-11 DIAGNOSIS — I152 Hypertension secondary to endocrine disorders: Secondary | ICD-10-CM

## 2021-10-11 DIAGNOSIS — Z794 Long term (current) use of insulin: Secondary | ICD-10-CM

## 2021-10-11 DIAGNOSIS — E1159 Type 2 diabetes mellitus with other circulatory complications: Secondary | ICD-10-CM

## 2021-10-11 DIAGNOSIS — E1122 Type 2 diabetes mellitus with diabetic chronic kidney disease: Secondary | ICD-10-CM | POA: Diagnosis not present

## 2021-10-11 DIAGNOSIS — N183 Chronic kidney disease, stage 3 unspecified: Secondary | ICD-10-CM | POA: Diagnosis not present

## 2021-10-11 MED ORDER — AMOXICILLIN-POT CLAVULANATE 875-125 MG PO TABS: 1.0000 | ORAL_TABLET | Freq: Two times a day (BID) | ORAL | 0 refills | Status: DC

## 2021-10-11 MED ORDER — AZELASTINE HCL 0.1 % NA SOLN: 2.0000 | Freq: Two times a day (BID) | NASAL | 12 refills | Status: AC

## 2021-10-11 NOTE — Assessment & Plan Note (Signed)
Follows with endocrinology. Increased risk for complicated URI.

## 2021-10-11 NOTE — Assessment & Plan Note (Signed)
Well controlled on amlodipine 5mg  daily and metoprolol tartrate 100mg  twice daily.

## 2021-10-11 NOTE — Progress Notes (Signed)
   Jerry York is a 70 y.o. male who presents today for a virtual office visit.  Assessment/Plan:  New/Acute Problems: Sinusitis Given that his symptoms of been present for over a week and are worsening will start Augmentin and Astelin. Discussed resaons to return to care. Follow up as needed. Can continue OTC meds. Continue good oral hydration.   Chronic Problems Addressed Today: Type 2 diabetes mellitus (HCC) Follows with endocrinology. Increased risk for complicated URI.   Hypertension associated with diabetes (HCC) Well controlled on amlodipine 5mg  daily and metoprolol tartrate 100mg  twice daily.     Subjective:  HPI:  Patient here with concerns for sinus infection. Symptoms started over a week ago. Tried taking sudafed which did not helped significantly. A lot of congestion and drainage. No fevers or chills. No sore throat or cough. Feels similar to previous sinus infection. Also some headache. Symptoms seems to be worsening. A lot more shortness of breath with walking. Wife has been sick with similar symptoms.        Objective/Observations  Physical Exam: Gen: NAD, resting comfortably Pulm: Normal work of breathing Neuro: Grossly normal, moves all extremities Psych: Normal affect and thought content  Virtual Visit via Video   I connected with on 10/11/21 at  1:00 PM EDT by a video enabled telemedicine application and verified that I am speaking with the correct person using two identifiers. The limitations of evaluation and management by telemedicine and the availability of in person appointments were discussed. The patient expressed understanding and agreed to proceed.   Patient location: Home Provider location: [facility] [place] Plan Persons participating in the virtual visit: Myself and Patient     10/13/21. [employer], MD 10/11/2021 11:19 AM

## 2021-10-11 NOTE — Telephone Encounter (Signed)
Nurse Assessment Nurse: Allie Bossier, RN, [place] Date/Time Lamount Cohen Time): 10/11/2021 9:15:53 AM Confirm and document reason for call. If symptomatic, describe symptoms. ---Caller states he thinks he has a sinus infection with a bad congestion that has been going on for about a week. He states he feels like he has a hard time catching his breath when walking. No fever. Does the patient have any new or worsening symptoms? ---Yes Will a triage be completed? ---Yes Related visit to physician within the last 2 weeks? ---No Does the PT have any chronic conditions? (i.e. diabetes, asthma, this includes High risk factors for pregnancy, etc.) ---Yes List chronic conditions. ---DM, HTN Is this a behavioral health or substance abuse call? ---No Guidelines Guideline Title Affirmed Question Affirmed Notes Nurse Date/Time (Eastern Time) COVID-19 - Diagnosed or Suspected MILD difficulty breathing (e.g., minimal/no SOB Allie Bossier, RN, [place] 10/11/2021 9:17:35 AM PLEASE NOTE: All timestamps contained within this report are represented as [place] Standard Time. CONFIDENTIALTY NOTICE: This fax transmission is intended only for the addressee. It contains information that is legally privileged, confidential or otherwise protected from use or disclosure. If you are not the intended recipient, you are strictly prohibited from reviewing, disclosing, copying using or disseminating any of this information or taking any action in reliance on or regarding this information. If you have received this fax in error, please notify us immediately by telephone so that we can arrange for its return to [name]. Phone: [phone], Toll-Free: [phone], Fax: [phone] Page: 2 of 2 Call Id: [id number] Guidelines Guideline Title Affirmed Question Affirmed Notes Nurse Date/Time Lamount Cohen Time) at rest, SOB with walking, pulse <100) Disp. Time Lamount Cohen Time) Disposition Final User 10/11/2021 9:12:08 AM Send to Urgent  Queue Albin Fischer 10/11/2021 9:25:21 AM See HCP within 4 Hours (or PCP triage) Yes Allie Bossier, RN, Theodosia Quay Disagree/Comply Comply Caller Understands Yes PreDisposition Call Doctor Care Advice Given Per Guideline SEE HCP (OR PCP TRIAGE) WITHIN 4 HOURS: Comments User: Albin Fischer Date/Time Lamount Cohen Time): 10/11/2021 9:11:41 AM caller disconnected before office could transfer. Referrals Warm transfer to backline

## 2021-10-14 ENCOUNTER — Other Ambulatory Visit: Payer: Self-pay | Admitting: Family Medicine

## 2021-10-15 ENCOUNTER — Ambulatory Visit (HOSPITAL_BASED_OUTPATIENT_CLINIC_OR_DEPARTMENT_OTHER): Payer: PPO | Admitting: Physical Therapy

## 2021-10-16 ENCOUNTER — Encounter: Payer: Self-pay | Admitting: Family Medicine

## 2021-10-18 ENCOUNTER — Other Ambulatory Visit: Payer: Self-pay | Admitting: *Deleted

## 2021-10-18 ENCOUNTER — Telehealth: Payer: Self-pay | Admitting: Pharmacist

## 2021-10-18 MED ORDER — DOXYCYCLINE MONOHYDRATE 100 MG PO CAPS: 100.0000 mg | ORAL_CAPSULE | Freq: Two times a day (BID) | ORAL | 0 refills | Status: AC

## 2021-10-18 NOTE — Telephone Encounter (Signed)
Please advise 

## 2021-10-18 NOTE — Progress Notes (Addendum)
Chronic Care Management Pharmacy Assistant   Name: Jerry York  MRN: [id number] DOB: [birth year]   Reason for Encounter: Monthly Medication Coordination Call   Medications: Outpatient Encounter Medications as of 10/18/2021  Medication Sig Note   allopurinol (ZYLOPRIM) 100 MG tablet Take 3 tablets by mouth once daily    AMBULATORY NON FORMULARY MEDICATION Clamshell type TLSO bace. Custom formed for postural control and core support.  Disp 1. R17.8 R26.9 Z98.890 M48.061    amLODipine (NORVASC) 5 MG tablet Take 1 tablet by mouth once daily    amoxicillin-clavulanate (AUGMENTIN) 875-125 MG tablet Take 1 tablet by mouth 2 (two) times daily.    atorvastatin (LIPITOR) 40 MG tablet Take 1 tablet (40 mg total) by mouth daily.    azelastine (ASTELIN) 0.1 % nasal spray Place 2 sprays into both nostrils 2 (two) times daily.    [name] Take 500 mg by mouth 2 (two) times daily.     clonazePAM (KLONOPIN) 0.5 MG tablet TAKE ONE TABLET BY MOUTH TWICE DAILY    colchicine 0.6 MG tablet TAKE 1 TABLET BY MOUTH ONCE DAILY. TAKE  2  TABLETS  DAILY  IF  EXPERIENCING  A  FLARE    Continuous Blood Gluc Sensor ([name] 14 DAY SENSOR) MISC USE AS DIRECTED    Continuous Blood Gluc Sensor ([name] 14 DAY SENSOR) MISC APPLY 1 KIT TOPICALLY EVERY 14 (FOURTEEN) DAYS    glucose monitoring kit ([name]) monitoring kit 1 each by Does not apply route as needed for other. Check blood sugar at home up to 3 times daily    HUMULIN R U-500 KWIKPEN 500 UNIT/ML kwikpen Inject 60-80 Units as directed See admin instructions. INJECT 80 UNITS BEFORE BREAKFAST 60 UNITS BEFORE LUNCH AND 60 UNITS BEFORE EVENING MEAL. [place] 09/20/2019: Pt states he will not be taking this unless [employer] agrees to pay for it. Dr. Buddy Duty submitted request 1-2 weeks ago   metoprolol tartrate (LOPRESSOR) 100 MG tablet Take 1 tablet by mouth twice daily    torsemide (DEMADEX) 20 MG tablet Take 2 tablets (40 mg total)  by mouth 2 (two) times daily.    traZODone (DESYREL) 100 MG tablet TAKE 2 TABLETS BY MOUTH EVERY DAY AT BEDTIME AS NEEDED FOR SLEEP    No facility-administered encounter medications on file as of 10/18/2021.   Reviewed chart for medication changes ahead of medication coordination call.  No OVs, Consults, or hospital visits since last care coordination call/Pharmacist visit. (If appropriate, list visit date, provider name)  No medication changes indicated OR if recent visit, treatment plan here.  BP Readings from Last 3 Encounters:  09/08/21 110/78  08/05/21 110/62  07/09/21 136/71    Lab Results  Component Value Date   HGBA1C 6.7 07/27/2021     Patient obtains medications through Adherence Packaging  90 Days   Last adherence delivery included: (medication name and frequency) Clonazepam 0.5 mg; one at breakfast, one at evening meal Trazodone 100 mg; two at bedtime Atorvastatin 40 mg; one at breakfast Metoprolol 100 mg; one at breakfast, on at evening meal Torsemide 20 mg; two at breakfast and two with evening meal Amlodipine 5 mg; one at breakfast  Patient is due for next adherence delivery on: 10/27/21 Called patient and reviewed medications and coordinated delivery.  Patient declined Azelastine nasal spray on this order. Reports he still has plenty on hand.   This delivery to include: Atorvastatin 40 mg; one at breakfast Metoprolol 100 mg; one at  breakfast, on at evening meal Torsemide 20 mg; two at breakfast and two with evening meal Amlodipine 5 mg; one at breakfast   Patient needs refills for None.  Confirmed delivery date of 10/27/21, advised patient that pharmacy will contact them the morning of delivery.   Care Gaps  AWV: done 05/20/21 Colonoscopy: Cologuard mailed 08/24/21 DM Eye Exam: due 01/26/22 DM Foot Exam: overdue 01/20/21 Microalbumin: done 04/22/20 HbgAIC: done 07/27/21 (6.7) DEXA: N/A Mammogram: N/A  Star Rating Drugs: atorvastatin (LIPITOR) 40 MG  tablet 07/26/21 90 days   Future Appointments  Date Time Provider [place]  10/20/2021  1:30 PM Gregor Hams, MD [facility] None  10/22/2021  1:45 PM Carney Living, PT [facility]  02/03/2022  1:30 PM Bo Merino, MD [facility] None  03/01/2022  3:00 PM [facility] CCM PHARMACIST [facility] PEC  05/26/2022  2:30 PM [facility] HEALTH COACH [facility] [place], [place] Pharmacist Assistant  [phone]  11 minutes spent in review, coordination, and documentation.  Reviewed by: Beverly Milch, PharmD Clinical Pharmacist [phone]

## 2021-10-20 ENCOUNTER — Ambulatory Visit (INDEPENDENT_AMBULATORY_CARE_PROVIDER_SITE_OTHER): Payer: PPO | Admitting: Family Medicine

## 2021-10-20 ENCOUNTER — Other Ambulatory Visit: Payer: Self-pay

## 2021-10-20 VITALS — Ht 67.0 in | Wt 165.0 lb

## 2021-10-20 DIAGNOSIS — R198 Other specified symptoms and signs involving the digestive system and abdomen: Secondary | ICD-10-CM

## 2021-10-20 DIAGNOSIS — R269 Unspecified abnormalities of gait and mobility: Secondary | ICD-10-CM

## 2021-10-20 NOTE — Progress Notes (Signed)
   I, Jerry York, LAT, ATC acting as a scribe for [name], MD.  Jerry York is a 70 y.o. male who presents to [facility] today for f/u of gait difficulties and postural weakness. Pt was last seen by Dr. [name] on 09/08/21 and was advised to contact [employer] about a TLSO brace and cont PT, of which he's completed 8 visits.   He has not yet received the brace but it is currently in process.  He has no different.  After discussion we really are waiting for the brace.  No charge for today's visit.  Check back after brace is available.

## 2021-10-21 ENCOUNTER — Other Ambulatory Visit: Payer: Self-pay | Admitting: Family Medicine

## 2021-10-22 ENCOUNTER — Ambulatory Visit (HOSPITAL_BASED_OUTPATIENT_CLINIC_OR_DEPARTMENT_OTHER): Payer: PPO | Admitting: Physical Therapy

## 2021-10-23 DIAGNOSIS — I469 Cardiac arrest, cause unspecified: Secondary | ICD-10-CM | POA: Diagnosis not present

## 2021-10-23 DIAGNOSIS — I499 Cardiac arrhythmia, unspecified: Secondary | ICD-10-CM | POA: Diagnosis not present

## 2021-10-23 DIAGNOSIS — I4901 Ventricular fibrillation: Secondary | ICD-10-CM | POA: Diagnosis not present

## 2021-10-23 DIAGNOSIS — J80 Acute respiratory distress syndrome: Secondary | ICD-10-CM | POA: Diagnosis not present

## 2021-10-24 DIAGNOSIS — M48061 Spinal stenosis, lumbar region without neurogenic claudication: Secondary | ICD-10-CM | POA: Diagnosis not present

## 2021-10-26 DIAGNOSIS — 419620001 Death: Secondary | SNOMED CT | POA: Diagnosis not present

## 2021-10-26 DEATH — deceased

## 2022-02-03 ENCOUNTER — Ambulatory Visit: Payer: PPO | Admitting: Rheumatology

## 2022-03-01 ENCOUNTER — Telehealth: Payer: PPO

## 2022-05-26 ENCOUNTER — Ambulatory Visit: Payer: PPO
